# Patient Record
Sex: Male | Born: 1955
Health system: Southern US, Community
[De-identification: ages and names within clinical notes are randomized; demographics above are authoritative.]

## PROBLEM LIST (undated history)

## (undated) ENCOUNTER — Emergency Department (HOSPITAL_COMMUNITY): Admission: EM | Payer: Self-pay | Source: Home / Self Care

## (undated) DIAGNOSIS — F419 Anxiety disorder, unspecified: Secondary | ICD-10-CM

## (undated) DIAGNOSIS — Z9289 Personal history of other medical treatment: Secondary | ICD-10-CM

## (undated) DIAGNOSIS — J449 Chronic obstructive pulmonary disease, unspecified: Secondary | ICD-10-CM

## (undated) DIAGNOSIS — I10 Essential (primary) hypertension: Secondary | ICD-10-CM

## (undated) DIAGNOSIS — J189 Pneumonia, unspecified organism: Secondary | ICD-10-CM

## (undated) DIAGNOSIS — Z9981 Dependence on supplemental oxygen: Secondary | ICD-10-CM

## (undated) HISTORY — PX: BACK SURGERY: SHX140

---

## 2001-06-18 ENCOUNTER — Emergency Department (HOSPITAL_COMMUNITY): Admission: EM | Admit: 2001-06-18 | Discharge: 2001-06-18 | Payer: Self-pay | Admitting: Internal Medicine

## 2002-01-23 ENCOUNTER — Encounter: Payer: Self-pay | Admitting: Emergency Medicine

## 2002-01-23 ENCOUNTER — Observation Stay (HOSPITAL_COMMUNITY): Admission: EM | Admit: 2002-01-23 | Discharge: 2002-01-24 | Payer: Self-pay | Admitting: Emergency Medicine

## 2003-06-06 ENCOUNTER — Emergency Department (HOSPITAL_COMMUNITY): Admission: EM | Admit: 2003-06-06 | Discharge: 2003-06-07 | Payer: Self-pay | Admitting: *Deleted

## 2003-06-06 ENCOUNTER — Encounter: Payer: Self-pay | Admitting: *Deleted

## 2003-08-15 ENCOUNTER — Emergency Department (HOSPITAL_COMMUNITY): Admission: EM | Admit: 2003-08-15 | Discharge: 2003-08-15 | Payer: Self-pay | Admitting: Emergency Medicine

## 2003-12-11 ENCOUNTER — Emergency Department (HOSPITAL_COMMUNITY): Admission: EM | Admit: 2003-12-11 | Discharge: 2003-12-11 | Payer: Self-pay | Admitting: Emergency Medicine

## 2003-12-13 ENCOUNTER — Ambulatory Visit (HOSPITAL_COMMUNITY): Admission: RE | Admit: 2003-12-13 | Discharge: 2003-12-13 | Payer: Self-pay | Admitting: Family Medicine

## 2003-12-13 ENCOUNTER — Inpatient Hospital Stay (HOSPITAL_COMMUNITY): Admission: AD | Admit: 2003-12-13 | Discharge: 2003-12-20 | Payer: Self-pay | Admitting: Family Medicine

## 2004-01-01 ENCOUNTER — Ambulatory Visit (HOSPITAL_COMMUNITY): Admission: RE | Admit: 2004-01-01 | Discharge: 2004-01-01 | Payer: Self-pay | Admitting: Family Medicine

## 2004-01-10 ENCOUNTER — Ambulatory Visit (HOSPITAL_COMMUNITY): Admission: RE | Admit: 2004-01-10 | Discharge: 2004-01-10 | Payer: Self-pay | Admitting: Family Medicine

## 2004-01-14 ENCOUNTER — Ambulatory Visit (HOSPITAL_COMMUNITY): Admission: RE | Admit: 2004-01-14 | Discharge: 2004-01-14 | Payer: Self-pay | Admitting: Family Medicine

## 2004-01-23 ENCOUNTER — Ambulatory Visit (HOSPITAL_COMMUNITY): Admission: RE | Admit: 2004-01-23 | Discharge: 2004-01-23 | Payer: Self-pay | Admitting: Family Medicine

## 2004-03-27 ENCOUNTER — Ambulatory Visit (HOSPITAL_COMMUNITY): Admission: RE | Admit: 2004-03-27 | Discharge: 2004-03-27 | Payer: Self-pay | Admitting: Family Medicine

## 2004-06-09 ENCOUNTER — Emergency Department (HOSPITAL_COMMUNITY): Admission: EM | Admit: 2004-06-09 | Discharge: 2004-06-10 | Payer: Self-pay | Admitting: Emergency Medicine

## 2004-07-24 ENCOUNTER — Ambulatory Visit (HOSPITAL_COMMUNITY): Admission: RE | Admit: 2004-07-24 | Discharge: 2004-07-24 | Payer: Self-pay | Admitting: Family Medicine

## 2004-07-31 ENCOUNTER — Ambulatory Visit (HOSPITAL_COMMUNITY): Admission: RE | Admit: 2004-07-31 | Discharge: 2004-07-31 | Payer: Self-pay | Admitting: Family Medicine

## 2004-09-16 ENCOUNTER — Ambulatory Visit (HOSPITAL_COMMUNITY): Admission: RE | Admit: 2004-09-16 | Discharge: 2004-09-16 | Payer: Self-pay | Admitting: Family Medicine

## 2004-09-22 ENCOUNTER — Ambulatory Visit (HOSPITAL_COMMUNITY): Admission: RE | Admit: 2004-09-22 | Discharge: 2004-09-22 | Payer: Self-pay | Admitting: Family Medicine

## 2004-11-23 ENCOUNTER — Emergency Department (HOSPITAL_COMMUNITY): Admission: EM | Admit: 2004-11-23 | Discharge: 2004-11-23 | Payer: Self-pay | Admitting: Emergency Medicine

## 2004-11-27 ENCOUNTER — Ambulatory Visit (HOSPITAL_COMMUNITY): Admission: RE | Admit: 2004-11-27 | Discharge: 2004-11-27 | Payer: Self-pay | Admitting: Family Medicine

## 2004-12-11 ENCOUNTER — Ambulatory Visit (HOSPITAL_COMMUNITY): Admission: RE | Admit: 2004-12-11 | Discharge: 2004-12-11 | Payer: Self-pay | Admitting: Family Medicine

## 2005-01-22 ENCOUNTER — Encounter (HOSPITAL_COMMUNITY): Admission: RE | Admit: 2005-01-22 | Discharge: 2005-02-21 | Payer: Self-pay | Admitting: Neurology

## 2005-02-23 ENCOUNTER — Encounter (HOSPITAL_COMMUNITY): Admission: RE | Admit: 2005-02-23 | Discharge: 2005-03-08 | Payer: Self-pay | Admitting: Neurology

## 2005-04-25 ENCOUNTER — Emergency Department (HOSPITAL_COMMUNITY): Admission: EM | Admit: 2005-04-25 | Discharge: 2005-04-25 | Payer: Self-pay | Admitting: Emergency Medicine

## 2005-06-19 ENCOUNTER — Inpatient Hospital Stay (HOSPITAL_COMMUNITY): Admission: EM | Admit: 2005-06-19 | Discharge: 2005-06-21 | Payer: Self-pay | Admitting: *Deleted

## 2005-08-24 ENCOUNTER — Emergency Department (HOSPITAL_COMMUNITY): Admission: EM | Admit: 2005-08-24 | Discharge: 2005-08-24 | Payer: Self-pay | Admitting: Emergency Medicine

## 2005-09-04 ENCOUNTER — Emergency Department (HOSPITAL_COMMUNITY): Admission: EM | Admit: 2005-09-04 | Discharge: 2005-09-04 | Payer: Self-pay | Admitting: Emergency Medicine

## 2006-06-16 ENCOUNTER — Emergency Department (HOSPITAL_COMMUNITY): Admission: EM | Admit: 2006-06-16 | Discharge: 2006-06-16 | Payer: Self-pay | Admitting: Emergency Medicine

## 2006-11-18 ENCOUNTER — Inpatient Hospital Stay (HOSPITAL_COMMUNITY): Admission: EM | Admit: 2006-11-18 | Discharge: 2006-11-21 | Payer: Self-pay | Admitting: Emergency Medicine

## 2007-12-29 ENCOUNTER — Ambulatory Visit (HOSPITAL_COMMUNITY): Admission: RE | Admit: 2007-12-29 | Discharge: 2007-12-29 | Payer: Self-pay | Admitting: Family Medicine

## 2008-01-26 ENCOUNTER — Ambulatory Visit (HOSPITAL_COMMUNITY): Admission: RE | Admit: 2008-01-26 | Discharge: 2008-01-26 | Payer: Self-pay | Admitting: Family Medicine

## 2008-02-08 ENCOUNTER — Inpatient Hospital Stay (HOSPITAL_COMMUNITY): Admission: AD | Admit: 2008-02-08 | Discharge: 2008-02-12 | Payer: Self-pay | Admitting: Family Medicine

## 2008-02-19 ENCOUNTER — Ambulatory Visit (HOSPITAL_COMMUNITY): Admission: RE | Admit: 2008-02-19 | Discharge: 2008-02-19 | Payer: Self-pay | Admitting: Family Medicine

## 2008-10-28 ENCOUNTER — Inpatient Hospital Stay (HOSPITAL_COMMUNITY): Admission: EM | Admit: 2008-10-28 | Discharge: 2008-11-01 | Payer: Self-pay | Admitting: Emergency Medicine

## 2008-11-07 ENCOUNTER — Ambulatory Visit (HOSPITAL_COMMUNITY): Admission: RE | Admit: 2008-11-07 | Discharge: 2008-11-07 | Payer: Self-pay | Admitting: Family Medicine

## 2008-11-30 ENCOUNTER — Inpatient Hospital Stay (HOSPITAL_COMMUNITY): Admission: EM | Admit: 2008-11-30 | Discharge: 2008-12-11 | Payer: Self-pay | Admitting: Emergency Medicine

## 2008-12-17 ENCOUNTER — Ambulatory Visit (HOSPITAL_COMMUNITY): Admission: RE | Admit: 2008-12-17 | Discharge: 2008-12-17 | Payer: Self-pay | Admitting: Family Medicine

## 2009-10-25 ENCOUNTER — Inpatient Hospital Stay (HOSPITAL_COMMUNITY): Admission: EM | Admit: 2009-10-25 | Discharge: 2009-10-29 | Payer: Self-pay | Admitting: Emergency Medicine

## 2010-05-26 ENCOUNTER — Inpatient Hospital Stay (HOSPITAL_COMMUNITY): Admission: EM | Admit: 2010-05-26 | Discharge: 2010-05-29 | Payer: Self-pay | Admitting: Emergency Medicine

## 2010-08-03 ENCOUNTER — Ambulatory Visit: Admission: RE | Admit: 2010-08-03 | Discharge: 2010-08-03 | Payer: Self-pay | Admitting: Family Medicine

## 2010-11-14 ENCOUNTER — Encounter (HOSPITAL_COMMUNITY): Payer: Self-pay | Admitting: Radiology

## 2010-11-14 ENCOUNTER — Emergency Department (HOSPITAL_COMMUNITY): Payer: Medicare HMO

## 2010-11-14 ENCOUNTER — Inpatient Hospital Stay (HOSPITAL_COMMUNITY)
Admission: EM | Admit: 2010-11-14 | Discharge: 2010-11-21 | DRG: 189 | Disposition: A | Discharge status: Home Health | Payer: Medicare HMO | Attending: Family Medicine | Admitting: Family Medicine

## 2010-11-14 DIAGNOSIS — J0100 Acute maxillary sinusitis, unspecified: Secondary | ICD-10-CM | POA: Diagnosis present

## 2010-11-14 DIAGNOSIS — J9620 Acute and chronic respiratory failure, unspecified whether with hypoxia or hypercapnia: Principal | ICD-10-CM | POA: Diagnosis present

## 2010-11-14 DIAGNOSIS — J441 Chronic obstructive pulmonary disease with (acute) exacerbation: Secondary | ICD-10-CM | POA: Diagnosis present

## 2010-11-14 DIAGNOSIS — J309 Allergic rhinitis, unspecified: Secondary | ICD-10-CM | POA: Diagnosis present

## 2010-11-14 DIAGNOSIS — E7800 Pure hypercholesterolemia, unspecified: Secondary | ICD-10-CM | POA: Diagnosis present

## 2010-11-14 DIAGNOSIS — E119 Type 2 diabetes mellitus without complications: Secondary | ICD-10-CM | POA: Diagnosis present

## 2010-11-14 DIAGNOSIS — F17200 Nicotine dependence, unspecified, uncomplicated: Secondary | ICD-10-CM | POA: Diagnosis present

## 2010-11-14 LAB — CBC
HCT: 39.7 % (ref 39.0–52.0)
Hemoglobin: 13.2 g/dL (ref 13.0–17.0)
MCV: 89.8 fL (ref 78.0–100.0)
WBC: 8.6 10*3/uL (ref 4.0–10.5)

## 2010-11-14 LAB — COMPREHENSIVE METABOLIC PANEL
AST: 15 U/L (ref 0–37)
Albumin: 3.5 g/dL (ref 3.5–5.2)
BUN: 4 mg/dL — ABNORMAL LOW (ref 6–23)
Calcium: 9.2 mg/dL (ref 8.4–10.5)
Chloride: 91 mEq/L — ABNORMAL LOW (ref 96–112)
Creatinine, Ser: 0.84 mg/dL (ref 0.4–1.5)
GFR calc Af Amer: >60 mL/min (ref >60)
GFR calc non Af Amer: >60 mL/min (ref >60)
Total Bilirubin: 0.5 mg/dL (ref 0.3–1.2)

## 2010-11-14 LAB — POCT CARDIAC MARKERS
CKMB, poc: 7.4 ng/mL (ref 1.0–8.0)
Myoglobin, poc: 128 ng/mL (ref 12–200)
Troponin i, poc: <0.05 ng/mL (ref 0.00–0.09)

## 2010-11-14 LAB — DIFFERENTIAL
Basophils Absolute: 0 10*3/uL (ref 0.0–0.1)
Eosinophils Relative: 1 % (ref 0–5)
Lymphocytes Relative: 24 % (ref 12–46)
Lymphs Abs: 2.1 10*3/uL (ref 0.7–4.0)
Monocytes Absolute: 0.7 10*3/uL (ref 0.1–1.0)
Neutro Abs: 5.7 10*3/uL (ref 1.7–7.7)

## 2010-11-15 ENCOUNTER — Inpatient Hospital Stay (HOSPITAL_COMMUNITY): Payer: Medicare HMO

## 2010-11-15 LAB — CBC
HCT: 38 % — ABNORMAL LOW (ref 39.0–52.0)
Hemoglobin: 12.4 g/dL — ABNORMAL LOW (ref 13.0–17.0)
RBC: 4.2 MIL/uL — ABNORMAL LOW (ref 4.22–5.81)
RDW: 13.4 % (ref 11.5–15.5)
WBC: 8.5 10*3/uL (ref 4.0–10.5)

## 2010-11-15 LAB — GLUCOSE, CAPILLARY
Glucose-Capillary: 147 mg/dL — ABNORMAL HIGH (ref 70–99)
Glucose-Capillary: 159 mg/dL — ABNORMAL HIGH (ref 70–99)
Glucose-Capillary: 151 mg/dL — ABNORMAL HIGH (ref 70–99)

## 2010-11-15 LAB — BLOOD GAS, ARTERIAL
Acid-Base Excess: 9.2 mmol/L — ABNORMAL HIGH (ref 0.0–2.0)
Bicarbonate: 34.9 mEq/L — ABNORMAL HIGH (ref 20.0–24.0)
Inspiratory PAP: 14
O2 Content: 3 L/min
O2 Saturation: 91.7 %
TCO2: 32.5 mmol/L (ref 0–100)
pCO2 arterial: 76.4 mmHg (ref 35.0–45.0)
pO2, Arterial: 78.2 mmHg — ABNORMAL LOW (ref 80.0–100.0)
pO2, Arterial: 60.7 mmHg — ABNORMAL LOW (ref 80.0–100.0)

## 2010-11-15 LAB — BASIC METABOLIC PANEL
Calcium: 9.5 mg/dL (ref 8.4–10.5)
GFR calc Af Amer: >60 mL/min (ref >60)
GFR calc non Af Amer: >60 mL/min (ref >60)
Potassium: 4.4 mEq/L (ref 3.5–5.1)
Sodium: 137 mEq/L (ref 135–145)

## 2010-11-15 LAB — DIFFERENTIAL
Basophils Absolute: 0 10*3/uL (ref 0.0–0.1)
Eosinophils Relative: 0 % (ref 0–5)
Lymphocytes Relative: 5 % — ABNORMAL LOW (ref 12–46)
Neutro Abs: 8.1 10*3/uL — ABNORMAL HIGH (ref 1.7–7.7)
Neutrophils Relative %: 95 % — ABNORMAL HIGH (ref 43–77)

## 2010-11-16 LAB — BLOOD GAS, ARTERIAL
Acid-Base Excess: 10.8 mmol/L — ABNORMAL HIGH (ref 0.0–2.0)
Bicarbonate: 36.9 mEq/L — ABNORMAL HIGH (ref 20.0–24.0)
Bicarbonate: 36.2 mEq/L — ABNORMAL HIGH (ref 20.0–24.0)
Delivery systems: POSITIVE
FIO2: 0.35 %
O2 Saturation: 97.7 %
Patient temperature: 37
Patient temperature: 37
TCO2: 33.2 mmol/L (ref 0–100)
TCO2: 33.4 mmol/L (ref 0–100)
pCO2 arterial: 71.8 mmHg (ref 35.0–45.0)
pH, Arterial: 7.336 — ABNORMAL LOW (ref 7.350–7.450)
pH, Arterial: 7.323 — ABNORMAL LOW (ref 7.350–7.450)
pO2, Arterial: 108 mmHg — ABNORMAL HIGH (ref 80.0–100.0)

## 2010-11-16 LAB — GLUCOSE, CAPILLARY: Glucose-Capillary: 156 mg/dL — ABNORMAL HIGH (ref 70–99)

## 2010-11-17 LAB — BLOOD GAS, ARTERIAL
Acid-Base Excess: 11.1 mmol/L — ABNORMAL HIGH (ref 0.0–2.0)
TCO2: 33.6 mmol/L (ref 0–100)
pCO2 arterial: 65.6 mmHg (ref 35.0–45.0)
pH, Arterial: 7.367 (ref 7.350–7.450)
pO2, Arterial: 92.3 mmHg (ref 80.0–100.0)

## 2010-11-17 LAB — GLUCOSE, CAPILLARY
Glucose-Capillary: 159 mg/dL — ABNORMAL HIGH (ref 70–99)
Glucose-Capillary: 163 mg/dL — ABNORMAL HIGH (ref 70–99)

## 2010-11-18 LAB — BLOOD GAS, ARTERIAL
Patient temperature: 37
pCO2 arterial: 65.2 mmHg (ref 35.0–45.0)
pH, Arterial: 7.361 (ref 7.350–7.450)

## 2010-11-18 LAB — GLUCOSE, CAPILLARY
Glucose-Capillary: 164 mg/dL — ABNORMAL HIGH (ref 70–99)
Glucose-Capillary: 234 mg/dL — ABNORMAL HIGH (ref 70–99)

## 2010-11-19 LAB — GLUCOSE, CAPILLARY
Glucose-Capillary: 315 mg/dL — ABNORMAL HIGH (ref 70–99)
Glucose-Capillary: 257 mg/dL — ABNORMAL HIGH (ref 70–99)
Glucose-Capillary: 335 mg/dL — ABNORMAL HIGH (ref 70–99)

## 2010-11-20 LAB — GLUCOSE, CAPILLARY
Glucose-Capillary: 252 mg/dL — ABNORMAL HIGH (ref 70–99)
Glucose-Capillary: 353 mg/dL — ABNORMAL HIGH (ref 70–99)
Glucose-Capillary: 298 mg/dL — ABNORMAL HIGH (ref 70–99)

## 2010-11-21 LAB — GLUCOSE, CAPILLARY
Glucose-Capillary: 162 mg/dL — ABNORMAL HIGH (ref 70–99)
Glucose-Capillary: 263 mg/dL — ABNORMAL HIGH (ref 70–99)

## 2010-11-21 NOTE — H&P (Signed)
NAME:  Jeff Ray, Jeff Ray                 ACCOUNT NO.:  [id number]  MEDICAL RECORD NO.:  [id number]           PATIENT TYPE:  I  LOCATION:  A306                          FACILITY:  [facility]  PHYSICIAN:  Mila Homer. Sudie Bailey, M.D.DATE OF BIRTH:  Mar 12, 1956  DATE OF ADMISSION:  11/14/2010 DATE OF DISCHARGE:  LH                             HISTORY & PHYSICAL   HISTORY OF PRESENT ILLNESS:  This 55 year old patient of Dr. Butch Penny developed symptoms of a maxillary sinusitis starting about 6 or 7 days prior to admission.  Then several days after that, he began having cough productive of white sputum and dyspnea.  The situation became bad enough that he presented to the [facility] emergency department November 14, 2010.  He has had numerous admissions for COPD.  Reviewing the electronic record, I note one in March 2010, one in January 2011, and one in August 2011.  He also has diagnoses of diabetes and hypercholesterolemia.  SOCIAL HISTORY:  He currently lives with his wife.  He has 3 sons and 1 grandchild.  He worked at [employer], functioned as a Scientist, forensic for 2 years. Then he worked for [employer] off and on for 16 years doing the same thing.  He worked in various places including the [employer] at [employer].  He has smoked cigarettes since age 75, currently, averaging about a pack a day but does not use alcohol or illicit drugs.  He also has diagnoses of diabetes and hypercholesterolemia.  CURRENT MEDICATIONS: 1. Symbicort daily. 2. Singulair 10 mg daily. 3. Simvastatin 40 mg daily. 4. Metformin 2 tablets daily. 5. Albuterol nebulizer as needed. 6. Hydromet. The dosage strengths and dosing intervals were not on the ER record and we are asked family to bring these.  REVIEW OF SYSTEMS:  He stated he has pain over his face.  He said he is blowing out thick green material, as well as coughing up whitish sputum. He has not had fever or  chills but he has had chest discomfort, but feels it is probably anxiety, and has felt that off and on over a month in the left chest.  It is not brought on by exercise.  He has had no problems his GI or GU systems.  PHYSICAL EXAMINATION:  GENERAL APPEARANCE:  Exam shows a pleasant middle- aged man who is currently getting a breathing treatment and is quite dyspneic. VITAL SIGNS:  At the time of admission, his temperature is 99 degrees, blood pressure 157/82, pulse 113, respiratory rate 24. HEAD AND NECK:  :  He is a good historian.  His speech is normal but again somewhat difficult to hear because he is working for breath with a mask on and albuterol treatment.  He has tenderness over his maxilla but not over the frontal sinuses.  There are negative anterior cervical nodes. LUNGS:  His lungs show decreased breath sounds throughout with prolonged expirations.  Intercostal retractions are noted. HEART:  Heart has a regular rhythm, rate of about 110 but heart sounds were faint. ABDOMEN:  The abdomen is soft without organomegaly or mass.  EXTREMITIES:  There is trace edema of the ankles.  LABORATORY DATA/DIAGNOSTIC STUDIES:  His chest x-ray showed COPD with chronic lung markings.  Today's CBC shows a white cell count of 8600 of which 67% were neutrophils, 24 lymphs.  Hemoglobin was 13.2, platelet count 196,000. CMP shows a sodium of 132, chloride 91, bicarb 35, glucose 160, BUN 4 with a creatinine of 0.84.  Cardiac markers were normal, D-dimer 0.42.  ASSESSMENT/ADMISSION DIAGNOSES: 1. Chronic obstructive pulmonary disease exacerbation. 2. Type 2 diabetes. 3. Tobacco use disorder. 4. Hypercholesterolemia. 5. Allergic rhinitis. 6. Acute maxillary sinusitis.  He is admitted to hospital.  He is getting breathing treatments and now BiPAP as he is still quite dyspneic.  He will be on albuterol, Atrovent neb treatments q.4 hours while awake and q.2 h p.r.n. with Solu-Medrol 125 mg IV  q.6, O2 to make his O2 sat greater than 90%.  Will continue him on alprazolam at 0.5 mg q.i.d. p.r.n. anxiety but I am holding his other meds.  Family to bring [place] exact dosages, dosing intervals.  He will be on a 2000 calorie no sweets added diet, bathroom privileges with assistance.  Will start him on Rocephin and Zithromax IV.     Mila Homer. Sudie Bailey, M.D.     SDK/MEDQ  D:  11/14/2010  T:  11/14/2010  Job:  [id number]  Electronically Signed by John Giovanni M.D. on 11/21/2010 05:23:46 AM

## 2010-11-21 NOTE — Progress Notes (Signed)
  NAME:  Jeff Ray, Jeff Ray                 ACCOUNT NO.:  [id number]  MEDICAL RECORD NO.:  [id number]           PATIENT TYPE:  I  LOCATION:  A306                          FACILITY:  [facility]  PHYSICIAN:  Mila Homer. Sudie Bailey, M.D.DATE OF BIRTH:  1956-06-15  DATE OF PROCEDURE: DATE OF DISCHARGE:                                PROGRESS NOTE   SUBJECTIVE:  I admitted him last night with COPD exacerbation.  He was quite dyspneic at the time.  During the evening, he stayed dyspneic and respiratory rate was elevated, so we put on BiPAP and he did better with this.  I asked Dr. Juanetta Gosling, pulmonologist, to see him in consult. Today, really is not saying much, but finally seems to be asleep after having a rough night.  OBJECTIVE:  VITAL SIGNS: Temperature 97.5, pulse 105, respiratory rate 38, blood pressure 133/77.  On my exam his respiratory rate is 30.  He is on BiPAP. LUNGS: Show decreased breath sounds throughout, but he is moving air well. CARDIOVASCULAR: Heart sounds are very faint, but pulse is running about 80 now. ABDOMEN: Soft, without organomegaly or mass. EXTREMITIES: There is no edema of the ankles.  LABORATORY DATA:  O2 sat, most recent, was 90% on 35% O2 mask with BiPAP and earlier was 93% on 35% mask with BiPAP.  Blood tests today showed a white cell count 8500, of which 95% were neutrophils.  His BMP showed a chloride of 95, bicarb 34, glucose 194.  Blood gases at about 3:00 a.m. this morning showed a pH of 7.28, with pCO2 of 76, pO2 of 78 on 3 liters by nasal cannula and then this morning on 14/4 BiPAP he had a pH of 7.31, pCO2 of 72, pO2 of 60.  ASSESSMENT: 1. Chronic obstructive pulmonary disease exacerbation with     hypercapnia. 2. Type 2 diabetes. 3. Tobacco use disorder. 4. Hypercholesterolemia. 5. Allergic rhinitis. 6. Acute maxillary sinusitis.  PLAN: 1. Continue the Rocephin and Zithromax IV. 2. Continue O2 with BiPAP.  I have asked Dr. Juanetta Gosling, pulmonologist,  to see him in consult as noted. 3. Continue IV steroids.     Mila Homer. Sudie Bailey, M.D.     SDK/MEDQ  D:  11/15/2010  T:  11/15/2010  Job:  [id number]  Electronically Signed by John Giovanni M.D. on 11/21/2010 05:24:01 AM

## 2010-11-24 NOTE — Progress Notes (Signed)
  NAME:  Jeff Ray, Jeff Ray                 ACCOUNT NO.:  [id number]  MEDICAL RECORD NO.:  [id number]            PATIENT TYPE:  LOCATION:                                 FACILITY:  PHYSICIAN:  Eugen Jeansonne G. Renard Matter, MD   DATE OF BIRTH:  1956-08-17  DATE OF PROCEDURE: DATE OF DISCHARGE:                                PROGRESS NOTE   This patient is off the BiPAP this morning.  Seems to be feeling fairly well with the exception of occasional cough and shortness of breath.  He does have end-stage COPD.  OBJECTIVE:  VITAL SIGNS:  Blood pressure 157/92, respirations 20, pulse 87, temperature 97.8. LUNGS:  Rhonchi bilaterally with diminished breath sounds. HEART:  Regular rhythm. ABDOMEN:  No palpable organs or masses.  ASSESSMENT:  The patient does have end-stage chronic obstructive pulmonary disease.  PLAN:  To continue current regimen.  We will attempt to get patient discharged over the weekend.     Jadyn Brasher G. Renard Matter, MD     AGM/MEDQ  D:  11/19/2010  T:  11/19/2010  Job:  [id number]  Electronically Signed by Butch Penny MD on 11/24/2010 06:32:21 AM

## 2010-11-24 NOTE — Discharge Summary (Signed)
NAME:  Jeff Ray, Jeff Ray                 ACCOUNT NO.:  [id number]  MEDICAL RECORD NO.:  [id number]           PATIENT TYPE:  I  LOCATION:  A306                          FACILITY:  [facility]  PHYSICIAN:  Daniell Paradise G. Renard Matter, MD   DATE OF BIRTH:  19-Dec-1955  DATE OF ADMISSION:  11/14/2010 DATE OF DISCHARGE:  02/18/2012LH                              DISCHARGE SUMMARY   DIAGNOSES: 1. End-stage chronic obstructive pulmonary disease. 2. Type 2 diabetes. 3. Hyperlipidemia. 4. Acute maxillary sinusitis.  CONDITION:  Stable and improved at time of his discharge.  This 55 year old patient developed symptoms of maxillary sinusitis 6-7 days prior to admission and several days after that, he began having cough productive of white sputum and dyspnea.  He presented to the emergency department and subsequently was admitted.  EXAMINATION:  GENERAL:  The patient was dyspneic at the time of his admission. VITAL SIGNS:  Blood pressure 157/82, pulse 113, respiration 24, temperature 99. HEENT:  Negative. LUNGS:  Diminished breath sound, prolonged expiration intercostal retractions noted. HEART:  Regular rhythm with a rate about 110. ABDOMEN:  No palpable organs or masses.  No organomegaly. EXTREMITIES:  Free of edema.  LABORATORY DATA:  CBC on admission:  WBC 8600 with hemoglobin 13.2, hematocrit 39.7.  CMP on admission:  Sodium 132, potassium 3.8, chloride of 91, CO2 35, glucose 116, BUN 4, creatinine 0.84.  D-dimer 0.42. Blood gases on admission, pH 7.282 with pCO2 of 76.4, pO2 78.2. Subsequent blood gas on the 15th showed a pH of 7.361 with a pCO2 of 65.2, pO2 68.4.  D-dimer on admission 0.42.  X-rays:  Chest x-ray on admission showed evidence of COPD with chronic lung findings.  No infiltrate or congestive heart failure or pneumothorax.  Subsequent chest x-ray showed similar findings, mild bibasilar atelectasis, chronic peribronchial thickening.  HOSPITAL COURSE:  The patient at the time of his  admission was placed on diabetic diet, was placed on IV Solu-Medrol 125 mg every 6 hours, albuterol and Atrovent neb treatments q.i.d.  He was placed on Zithromax 500 mg daily and Rocephin 1 gram every 24 hours.  He is continued on guaifenesin 1200 mg b.i.d., insulin according to sliding scale, Singulair 10 mg daily, nicotine patch daily.  Placed on prednisone 40 mg daily following the IV Solu-Medrol, simvastatin 40 mg daily.  Also received p.r.n. medications, alprazolam 0.5 mg every 4 hours, Hycodan every 4 hours p.r.n.  The patient slowly improved throughout his hospital stay.  He had marked dyspnea with hypoxia and hypercapnia, was seen in consultation by Pulmonology, Dr. Juanetta Gosling.  His blood gases were monitored and he slowly was able to be transitioned from BiPAP to nasal cannula, remained in the hospital total of 7 days and was able be discharged on the seventh day to home care.  He was discharged on the following medications: 1. Guaifenesin 1200 mg b.i.d. 2. Alprazolam 0.5 mg every 4 hours p.r.n. 3. Albuterol inhaler every 6 hours. 4. Hydromet a teaspoon every 4 hours p.r.n. 5. Ibuprofen 200 mg 2 tablets every 6 hours as needed. 6. Metformin 500 mg 2 tablets b.i.d. 7. Symbicort 2 puffs  b.i.d. 8. Simvastatin 40 mg daily. 9. Singulair 10 mg daily.  The patient was urged to use a BiPAP at home, which he already has been set up at home for use.  The patient was more stable at time of his discharge.     Jeff Ray G. Renard Matter, MD     AGM/MEDQ  D:  11/21/2010  T:  11/21/2010  Job:  [id number]  Electronically Signed by Butch Penny MD on 11/24/2010 06:32:12 AM

## 2010-11-24 NOTE — Progress Notes (Signed)
  NAME:  Jeff Ray, Jeff Ray                 ACCOUNT NO.:  [id number]  MEDICAL RECORD NO.:  [id number]           PATIENT TYPE:  LOCATION:                                 FACILITY:  PHYSICIAN:  Lilla Callejo G. Renard Matter, MD   DATE OF BIRTH:  1955-12-20  DATE OF PROCEDURE:  11/18/2010 DATE OF DISCHARGE:                                PROGRESS NOTE   SUBJECTIVE:  This patient apparently was off the BiPAP most of the day yesterday with nasal cannula.  He had to be placed back on last evening, he does have COPD.  OBJECTIVE:  VITAL SIGNS:  Blood pressure 157/91, temperature 97.8, pulse 82, and respirations 24. LUNGS:  Diminished breath sounds bilaterally. HEART:  Regular rhythm. ABDOMEN:  No palpable organs or masses.  ASSESSMENT:  The patient does have severe chronic obstructive pulmonary disease and maxillary sinusitis.  PLAN:  To continue current regimen.  We will continue to attempt to get the patient off of BiPAP and on nasal cannula.  Blood sugars run from 159 to 229 on current regimen.  His blood gases are improving.     Windi Toro G. Renard Matter, MD     AGM/MEDQ  D:  11/18/2010  T:  11/18/2010  Job:  [id number]  Electronically Signed by Butch Penny MD on 11/24/2010 06:32:18 AM

## 2010-11-24 NOTE — Progress Notes (Signed)
  NAME:  Jeff Ray, Jeff Ray                 ACCOUNT NO.:  [id number]  MEDICAL RECORD NO.:  [id number]           PATIENT TYPE:  I  LOCATION:  A306                          FACILITY:  [facility]  PHYSICIAN:  Chassity Ludke G. Renard Matter, MD   DATE OF BIRTH:  10/13/1955  DATE OF PROCEDURE: DATE OF DISCHARGE:                                PROGRESS NOTE   This patient appears to be comfortable.  Attempts were made yesterday to get him on nasal cannula but he had been placed back on BiPAP.  He does have COPD.  His most recent blood gases show pH of 7.367 with a pCO2 of 65.6 and a pO2 92.3.  OBJECTIVE:  VITAL SIGNS:  Blood pressure 136/85, respirations 20, pulse 85, temperature 97.5. LUNGS:  Diminished breath sounds bilaterally. HEART:  Regular rhythm. ABDOMEN:  No palpable organs or masses.  ASSESSMENT:  The patient was admitted, continued to have chronic obstructive pulmonary disease and maxillary sinusitis.  PLAN:  To continue current regimen.  We will attempt to get the patient off of BiPAP and back on nasal cannula in the near future.     Hayden Mabin G. Renard Matter, MD     AGM/MEDQ  D:  11/17/2010  T:  11/17/2010  Job:  [id number]  Electronically Signed by Butch Penny MD on 11/24/2010 06:32:15 AM

## 2010-11-24 NOTE — Progress Notes (Signed)
  NAME:  Jeff Ray, Jeff Ray                 ACCOUNT NO.:  [id number]  MEDICAL RECORD NO.:  [id number]           PATIENT TYPE:  I  LOCATION:  A306                          FACILITY:  [facility]  PHYSICIAN:  Jeff Sneed G. Renard Matter, MD   DATE OF BIRTH:  [birth year]  DATE OF PROCEDURE:  11/20/2010 DATE OF DISCHARGE:                                PROGRESS NOTE   This patient had a fairly good day yesterday, was on nasal cannula for most of the day, but started back on BiPAP last evening.  He states that he feels better.  He does have end-stage COPD.  OBJECTIVE:  VITAL SIGNS:  Blood pressure 144/82, respirations 24, pulse 73, temperature 97.4.  Most recent blood gases pH 7.361 with pCO2 of 65.2, pO2 of 68.4. LUNGS:  Diminished breath sounds. HEART:  Regular rhythm. ABDOMEN:  No palpable organs or masses.  ASSESSMENT:  The patient does have end-stage chronic obstructive pulmonary disease, but has improved some.  Plan to continue current regimen.  We will attempt to get the patient discharged over the weekend.     Jeff Ray G. Renard Matter, MD     AGM/MEDQ  D:  11/20/2010  T:  11/20/2010  Job:  [id number]  Electronically Signed by Butch Penny MD on 11/24/2010 06:32:24 AM

## 2010-11-30 NOTE — Progress Notes (Signed)
  NAME:  Jeff Ray, Jeff Ray                 ACCOUNT NO.:  [id number]  MEDICAL RECORD NO.:  [id number]           PATIENT TYPE:  LOCATION:                                 FACILITY:  PHYSICIAN:  Mcdonald Reiling L. Juanetta Gosling, M.D.DATE OF BIRTH:  Sep 23, 1956  DATE OF PROCEDURE: DATE OF DISCHARGE:                                PROGRESS NOTE   Jeff Ray is a patient of Dr. Renard Matter with severe COPD.  He has been on BiPAP.  We attempted to switch him from BiPAP to nasal cannula yesterday and that was not successful.  He is, otherwise, about the same.  His exam shows that his chest is relatively clear with decreased breath sounds.  His heart is regular.  His blood gas looks better.  His pCO2 is down in the 60s now, pH better at about 7.34, so I think we should try again to see if he can come off the BiPAP.  My assessment is that he has COPD with acute-on-chronic respiratory failure, and my plan is to continue with his meds and treatments, try him off the BiPAP and see how he does.     Jariana Shumard L. Juanetta Gosling, M.D.     ELH/MEDQ  D:  11/17/2010  T:  11/17/2010  Job:  [id number]  Electronically Signed by Kari Baars M.D. on 11/30/2010 02:01:14 PM

## 2010-11-30 NOTE — Progress Notes (Signed)
  NAME:  Jeff Ray, Jeff Ray                 ACCOUNT NO.:  [id number]  MEDICAL RECORD NO.:  [id number]           PATIENT TYPE:  LOCATION:                                 FACILITY:  PHYSICIAN:  Bodie Abernethy L. Juanetta Gosling, M.D.DATE OF BIRTH:  [birth year]  DATE OF PROCEDURE: DATE OF DISCHARGE:                                PROGRESS NOTE   Jeff Ray is a patient of Dr. Renard Matter who is admitted with COPD exacerbation and acute respiratory failure.  He is much improved and says he is hopeful of going home tomorrow.  He is still wearing BiPAP at night, but he has it at home as well.  PHYSICAL EXAMINATION:  VITAL SIGNS:  Temperature is 97.4, pulse 73, respirations 24, and blood pressure 144/82.  His O2 sat 93% on 35% BiPAP. CHEST:  Much clearer. GENERAL:  He looks more comfortable.  ASSESSMENT:  I think he is improving and probably close to maximum hospital benefit and I agree with plans to try to get him home hopefully in the morning.  I would not change any other treatments at this point. He will remain on BiPAP at home.  He wants to make an appointment to come see me in my office as well as seeing Dr. Renard Matter and I think that is appropriate given the degree of his COPD.     Sopheap Boehle L. Juanetta Gosling, M.D.     ELH/MEDQ  D:  11/20/2010  T:  11/20/2010  Job:  [id number]  Electronically Signed by Kari Baars M.D. on 11/30/2010 02:01:25 PM

## 2010-11-30 NOTE — Progress Notes (Signed)
  NAME:  EILEEN, CROSWELL                 ACCOUNT NO.:  [id number]  MEDICAL RECORD NO.:  [id number]           PATIENT TYPE:  LOCATION:                                 FACILITY:  PHYSICIAN:  Mirtha Jain L. Juanetta Gosling, M.D.DATE OF BIRTH:  [birth year]  DATE OF PROCEDURE: DATE OF DISCHARGE:                                PROGRESS NOTE   Mr. Bublitz is improved and says he hopes he is going to be able to go home today.  He has no new complaints.  GENERAL:  His exam shows that he is awake and alert.  He looks comfortable. VITAL SIGNS:  His temperature is 97.6, pulse 65, respirations 16, blood pressure 130/84, O2 sat 95% on 4 liters. CHEST:  Clear with decreased breath sounds.  He looks much better.  ASSESSMENT:  He is improved.  PLAN:  I have discussed the situation with Dr. Renard Matter.  McInnis is contemplating discharge.     Satori Krabill L. Juanetta Gosling, M.D.     ELH/MEDQ  D:  11/21/2010  T:  11/21/2010  Job:  [id number]  Electronically Signed by Kari Baars M.D. on 11/30/2010 02:01:22 PM

## 2010-11-30 NOTE — Consult Note (Signed)
  NAME:  Jeff Ray, Jeff Ray                 ACCOUNT NO.:  [id number]  MEDICAL RECORD NO.:  [id number]           PATIENT TYPE:  I  LOCATION:  A306                          FACILITY:  [facility]  PHYSICIAN:  Simara Rhyner L. Juanetta Gosling, M.D.DATE OF BIRTH:  06-18-1956  DATE OF CONSULTATION: DATE OF DISCHARGE:  11/15/2010                                CONSULTATION   Jeff Ray is a patient of Dr. Renard Matter.  Consultation is requested because of COPD exacerbation and acute-on- chronic respiratory failure.  Jeff Ray is a 55 year old, patient of Dr. Renard Matter, who started having what was described as maxillary sinusitis several days prior to admission, then he became increasingly short of breath, started coughing up a lot of sputum and came to the emergency room.  He has been hospitalized on multiple occasions with COPD.  He has not had any definite fever or chills and I cannot get much history from him now because he is on BiPAP.  According to the medical record, he has COPD, diabetes, and hyperlipidemia.  SOCIAL HISTORY:  He lives at home with family.  He worked in a [employer]. He has about a 50 pack-year smoking history, continues to smoke cigarettes.  He does not use any alcohol or illicit drugs.  FAMILY HISTORY:  Unknown.  REVIEW OF SYSTEMS:  Except mention of COPD in old medical record.  There is also I mentioned that he in the past has had significant and excess use of alcohol.  HOME MEDICATIONS: 1. He has been taking Symbicort 2 inhalations b.i.d. 2. Singulair 10 mg daily. 3. Simvastatin 40 mg daily. 4. Metformin 2 tablets daily. 5. Albuterol by nebulizer as needed.  PHYSICAL EXAMINATION:  GENERAL:  His exam shows that he is on BiPAP.  He is arousable, but sleepy. CHEST:  Relatively clear with decreased breath sounds. HEART:  Regular without gallop. ABDOMEN:  Soft. VITAL SIGNS:  Temperature is 97.5, pulse 105, respirations 38, blood pressure 133/77.  He has some edema in his maxillary sinus  are and he has tender in the maxillary sinus area.  His blood gas on BiPAP this morning shows pH 7.31, pCO2 of 72, pO2 of 60.7.  His chest x-ray does not show anything acute, does show diffuse peribronchial thickening.  ASSESSMENT:  He has what appears to be maxillary sinusitis.  He has severe chronic obstructive pulmonary disease and he is being treated for that.  I have just discussed the situation with his respiratory therapist and adjustments have been made his BiPAP which may improve his pCO2.  I think at this point he is on antibiotics.  He is on steroids. He is on inhaled bronchodilators and I do not think there is a great deal to add and we will plan to continue this regimen and follow.     Jeff Ray L. Juanetta Gosling, M.D.     ELH/MEDQ  D:  11/15/2010  T:  11/16/2010  Job:  [id number]  Electronically Signed by Kari Baars M.D. on 11/30/2010 02:00:48 PM

## 2010-11-30 NOTE — Progress Notes (Signed)
  NAME:  Jeff Ray, Jeff Ray                 ACCOUNT NO.:  [id number]  MEDICAL RECORD NO.:  [id number]           PATIENT TYPE:  LOCATION:                                 FACILITY:  PHYSICIAN:  Deania Siguenza L. Juanetta Gosling, M.D.DATE OF BIRTH:  1956/02/11  DATE OF PROCEDURE: DATE OF DISCHARGE:                                PROGRESS NOTE   Jeff Ray is a patient of Dr. Renard Matter with severe end-stage COPD.  I discussed all this with him this morning and he has BiPAP at home, but he does not use it.  He says he will use it now.  He feels better, seems to be doing better.  He was able to stay on a nasal cannula most of the day yesterday.  His exam shows his blood pressure 137/82, temperature is 97.6, pulse 65, respirations 20, O2 sat 100% on BiPAP at 35%.  His chest is much clearer and overall he looks better.  He is set for blood gas today.  My assessment then is that he has severe chronic obstructive pulmonary disease.  He has required BiPAP at home, but he has not using and that may be part of the problem.  He has had an exacerbation of his chronic obstructive pulmonary disease.  I would plan to put him back on nasal cannula, put him back on the BiPAP at night and that is how he is doing at his home and then hopefully we can arrange for him to be candidate for something like Daliresp to reduce the number of exacerbations of COPD.     Jeff Ray L. Juanetta Gosling, M.D.     ELH/MEDQ  D:  11/18/2010  T:  11/18/2010  Job:  [id number]  Electronically Signed by Kari Baars M.D. on 11/30/2010 02:01:08 PM

## 2010-11-30 NOTE — Progress Notes (Signed)
  NAME:  Jeff Ray                 ACCOUNT NO.:  [id number]  MEDICAL RECORD NO.:  [id number]           PATIENT TYPE:  I  LOCATION:  A306                          FACILITY:  [facility]  PHYSICIAN:  Jeff Ray, M.D.DATE OF BIRTH:  09-Apr-1956  DATE OF PROCEDURE: DATE OF DISCHARGE:                                PROGRESS NOTE   Jeff Ray is a patient of Dr. Renard Ray, admitted with COPD.  He has chronic respiratory failure.  He has been doing better.  He is on BiPAP and he is using his nasal cannula during the day.  He says he feels okay.  He has no new complaints.  PHYSICAL EXAMINATION:  GENERAL:  Shows that he is awake and alert. CHEST:  Relatively clear. HEART:  Regular without gallop. ABDOMEN:  Soft. EXTREMITIES:  No edema.  He has some minimal end-expiratory wheezes.  Assessment then he is better.  PLAN:  Continue with his current treatments and medications.  No changes today.     Jeff Ray, M.D.     ELH/MEDQ  D:  11/19/2010  T:  11/19/2010  Job:  [id number]  Electronically Signed by Kari Baars M.D. on 11/30/2010 02:01:18 PM

## 2010-11-30 NOTE — Progress Notes (Signed)
  NAME:  Jeff Ray, Jeff Ray                 ACCOUNT NO.:  [id number]  MEDICAL RECORD NO.:  [id number]           PATIENT TYPE:  I  LOCATION:  A306                          FACILITY:  [facility]  PHYSICIAN:  Jakita Dutkiewicz L. Juanetta Gosling, M.D.DATE OF BIRTH:  [birth year]  DATE OF PROCEDURE: DATE OF DISCHARGE:                                PROGRESS NOTE   Mr. Stacey looks much better this morning.  He was admitted with COPD exacerbation.  He has been on BiPAP and is still on BiPAP.  His blood gas is not much different, but overall I think he looks better.  He has no other new complaints.  PHYSICAL EXAMINATION:  VITAL SIGNS:  His exam now shows that his temperature is 97.1, pulse 96, respirations 23, blood pressure 120/80, O2 sats 100% on 35% and BiPAP. CHEST:  Much clear.  He is much more alert and overall I think looks better.  His blood gas this morning on BiPAP shows pH 7.33, PCO2 of 70, PO2 of 92.  ASSESSMENT:  He is much improved and I am going to try him off the BiPAP and see how he tolerates that.     Lyvonne Cassell L. Juanetta Gosling, M.D.     ELH/MEDQ  D:  11/16/2010  T:  11/16/2010  Job:  [id number]  Electronically Signed by Kari Baars M.D. on 11/30/2010 02:01:03 PM

## 2010-12-17 LAB — GLUCOSE, CAPILLARY
Glucose-Capillary: 215 mg/dL — ABNORMAL HIGH (ref 70–99)
Glucose-Capillary: 219 mg/dL — ABNORMAL HIGH (ref 70–99)
Glucose-Capillary: 241 mg/dL — ABNORMAL HIGH (ref 70–99)
Glucose-Capillary: 260 mg/dL — ABNORMAL HIGH (ref 70–99)
Glucose-Capillary: 285 mg/dL — ABNORMAL HIGH (ref 70–99)
Glucose-Capillary: 320 mg/dL — ABNORMAL HIGH (ref 70–99)
Glucose-Capillary: 330 mg/dL — ABNORMAL HIGH (ref 70–99)

## 2010-12-17 LAB — CBC
MCH: 30.4 pg (ref 26.0–34.0)
Platelets: 171 10*3/uL (ref 150–400)
RBC: 4.56 MIL/uL (ref 4.22–5.81)

## 2010-12-17 LAB — BASIC METABOLIC PANEL
CO2: 36 mEq/L — ABNORMAL HIGH (ref 19–32)
Calcium: 9.5 mg/dL (ref 8.4–10.5)
Creatinine, Ser: 0.93 mg/dL (ref 0.4–1.5)
GFR calc Af Amer: >60 mL/min (ref >60)

## 2010-12-17 LAB — DIFFERENTIAL
Basophils Relative: 0 % (ref 0–1)
Eosinophils Absolute: 0.1 10*3/uL (ref 0.0–0.7)
Lymphs Abs: 2.1 10*3/uL (ref 0.7–4.0)
Neutrophils Relative %: 65 % (ref 43–77)

## 2010-12-20 LAB — GLUCOSE, CAPILLARY
Glucose-Capillary: 134 mg/dL — ABNORMAL HIGH (ref 70–99)
Glucose-Capillary: 139 mg/dL — ABNORMAL HIGH (ref 70–99)
Glucose-Capillary: 157 mg/dL — ABNORMAL HIGH (ref 70–99)
Glucose-Capillary: 166 mg/dL — ABNORMAL HIGH (ref 70–99)
Glucose-Capillary: 172 mg/dL — ABNORMAL HIGH (ref 70–99)
Glucose-Capillary: 180 mg/dL — ABNORMAL HIGH (ref 70–99)
Glucose-Capillary: 183 mg/dL — ABNORMAL HIGH (ref 70–99)
Glucose-Capillary: 191 mg/dL — ABNORMAL HIGH (ref 70–99)
Glucose-Capillary: 205 mg/dL — ABNORMAL HIGH (ref 70–99)
Glucose-Capillary: 215 mg/dL — ABNORMAL HIGH (ref 70–99)

## 2010-12-20 LAB — BASIC METABOLIC PANEL
CO2: 35 mEq/L — ABNORMAL HIGH (ref 19–32)
Chloride: 96 mEq/L (ref 96–112)
Glucose, Bld: 143 mg/dL — ABNORMAL HIGH (ref 70–99)
Potassium: 4.1 mEq/L (ref 3.5–5.1)
Sodium: 136 mEq/L (ref 135–145)

## 2010-12-20 LAB — DIFFERENTIAL
Basophils Absolute: 0 10*3/uL (ref 0.0–0.1)
Basophils Relative: 0 % (ref 0–1)
Eosinophils Relative: 0 % (ref 0–5)
Lymphocytes Relative: 22 % (ref 12–46)
Monocytes Absolute: 0.2 10*3/uL (ref 0.1–1.0)
Monocytes Relative: 3 % (ref 3–12)

## 2010-12-20 LAB — POCT CARDIAC MARKERS: Troponin i, poc: <0.05 ng/mL (ref 0.00–0.09)

## 2010-12-20 LAB — CBC
HCT: 41.3 % (ref 39.0–52.0)
Hemoglobin: 13.6 g/dL (ref 13.0–17.0)
MCHC: 32.8 g/dL (ref 30.0–36.0)
RDW: 13.6 % (ref 11.5–15.5)

## 2010-12-28 ENCOUNTER — Emergency Department (HOSPITAL_COMMUNITY)
Admission: EM | Admit: 2010-12-28 | Discharge: 2010-12-28 | Disposition: A | Discharge status: Home / Self Care | Payer: Medicare HMO | Attending: Emergency Medicine | Admitting: Emergency Medicine

## 2010-12-28 ENCOUNTER — Emergency Department (HOSPITAL_COMMUNITY): Payer: Medicare HMO

## 2010-12-28 DIAGNOSIS — J449 Chronic obstructive pulmonary disease, unspecified: Secondary | ICD-10-CM | POA: Insufficient documentation

## 2010-12-28 DIAGNOSIS — Z79899 Other long term (current) drug therapy: Secondary | ICD-10-CM | POA: Insufficient documentation

## 2010-12-28 DIAGNOSIS — R0602 Shortness of breath: Secondary | ICD-10-CM | POA: Insufficient documentation

## 2010-12-28 DIAGNOSIS — J4489 Other specified chronic obstructive pulmonary disease: Secondary | ICD-10-CM | POA: Insufficient documentation

## 2010-12-28 DIAGNOSIS — E119 Type 2 diabetes mellitus without complications: Secondary | ICD-10-CM | POA: Insufficient documentation

## 2010-12-28 DIAGNOSIS — E7800 Pure hypercholesterolemia, unspecified: Secondary | ICD-10-CM | POA: Insufficient documentation

## 2010-12-28 LAB — BASIC METABOLIC PANEL
BUN: 6 mg/dL (ref 6–23)
GFR calc non Af Amer: >60 mL/min (ref >60)
Potassium: 4 mEq/L (ref 3.5–5.1)
Sodium: 138 mEq/L (ref 135–145)

## 2010-12-28 LAB — CBC
MCV: 93.1 fL (ref 78.0–100.0)
Platelets: 187 10*3/uL (ref 150–400)
RDW: 14.1 % (ref 11.5–15.5)
WBC: 10.3 10*3/uL (ref 4.0–10.5)

## 2011-01-14 LAB — BLOOD GAS, ARTERIAL
Acid-Base Excess: 11 mmol/L — ABNORMAL HIGH (ref 0.0–2.0)
Acid-Base Excess: 12.1 mmol/L — ABNORMAL HIGH (ref 0.0–2.0)
Acid-Base Excess: 12.2 mmol/L — ABNORMAL HIGH (ref 0.0–2.0)
Acid-Base Excess: 12.2 mmol/L — ABNORMAL HIGH (ref 0.0–2.0)
Acid-Base Excess: 12.3 mmol/L — ABNORMAL HIGH (ref 0.0–2.0)
Acid-Base Excess: 8.8 mmol/L — ABNORMAL HIGH (ref 0.0–2.0)
Bicarbonate: 35.2 mEq/L — ABNORMAL HIGH (ref 20.0–24.0)
Bicarbonate: 35.3 mEq/L — ABNORMAL HIGH (ref 20.0–24.0)
Bicarbonate: 36.2 mEq/L — ABNORMAL HIGH (ref 20.0–24.0)
Bicarbonate: 36.5 mEq/L — ABNORMAL HIGH (ref 20.0–24.0)
Bicarbonate: 38.6 mEq/L — ABNORMAL HIGH (ref 20.0–24.0)
Delivery systems: POSITIVE
Delivery systems: POSITIVE
Expiratory PAP: 10
FIO2: 35 %
FIO2: 35 %
FIO2: 50 %
FIO2: 50 %
Inspiratory PAP: 22
MECHVT: 600 mL
MECHVT: 600 mL
MECHVT: 600 mL
Mode: 600
Mode: POSITIVE
O2 Content: 3 L/min
O2 Content: 35 L/min
O2 Saturation: 91.5 %
O2 Saturation: 94.8 %
O2 Saturation: 95.2 %
O2 Saturation: 95.8 %
O2 Saturation: 99.9 %
PEEP: 5 cmH2O
PEEP: 5 cmH2O
Patient temperature: 37
Patient temperature: 37
Patient temperature: 37
Patient temperature: 37
RATE: 12 resp/min
RATE: 12 resp/min
TCO2: 29.2 mmol/L (ref 0–100)
TCO2: 30.7 mmol/L (ref 0–100)
TCO2: 32.5 mmol/L (ref 0–100)
TCO2: 33.2 mmol/L (ref 0–100)
TCO2: 33.3 mmol/L (ref 0–100)
pCO2 arterial: 47.3 mmHg — ABNORMAL HIGH (ref 35.0–45.0)
pCO2 arterial: 55 mmHg — ABNORMAL HIGH (ref 35.0–45.0)
pCO2 arterial: 56.5 mmHg — ABNORMAL HIGH (ref 35.0–45.0)
pCO2 arterial: 60.2 mmHg (ref 35.0–45.0)
pH, Arterial: 7.395 (ref 7.350–7.450)
pH, Arterial: 7.402 (ref 7.350–7.450)
pH, Arterial: 7.407 (ref 7.350–7.450)
pH, Arterial: 7.42 (ref 7.350–7.450)
pH, Arterial: 7.424 (ref 7.350–7.450)
pO2, Arterial: 70.1 mmHg — ABNORMAL LOW (ref 80.0–100.0)
pO2, Arterial: 70.9 mmHg — ABNORMAL LOW (ref 80.0–100.0)
pO2, Arterial: 76.8 mmHg — ABNORMAL LOW (ref 80.0–100.0)
pO2, Arterial: 81.1 mmHg (ref 80.0–100.0)
pO2, Arterial: 87.3 mmHg (ref 80.0–100.0)
pO2, Arterial: 87.6 mmHg (ref 80.0–100.0)

## 2011-01-14 LAB — BASIC METABOLIC PANEL
BUN: 26 mg/dL — ABNORMAL HIGH (ref 6–23)
BUN: 27 mg/dL — ABNORMAL HIGH (ref 6–23)
Calcium: 9 mg/dL (ref 8.4–10.5)
Calcium: 9.4 mg/dL (ref 8.4–10.5)
Calcium: 9.7 mg/dL (ref 8.4–10.5)
Chloride: 99 mEq/L (ref 96–112)
Creatinine, Ser: 0.91 mg/dL (ref 0.4–1.5)
Creatinine, Ser: 1.08 mg/dL (ref 0.4–1.5)
Creatinine, Ser: 1.42 mg/dL (ref 0.4–1.5)
GFR calc Af Amer: >60 mL/min (ref >60)
GFR calc Af Amer: >60 mL/min (ref >60)
GFR calc non Af Amer: 52 mL/min — ABNORMAL LOW (ref >60)
GFR calc non Af Amer: >60 mL/min (ref >60)
Glucose, Bld: 205 mg/dL — ABNORMAL HIGH (ref 70–99)
Sodium: 138 mEq/L (ref 135–145)

## 2011-01-14 LAB — GLUCOSE, CAPILLARY
Glucose-Capillary: 115 mg/dL — ABNORMAL HIGH (ref 70–99)
Glucose-Capillary: 151 mg/dL — ABNORMAL HIGH (ref 70–99)
Glucose-Capillary: 174 mg/dL — ABNORMAL HIGH (ref 70–99)
Glucose-Capillary: 190 mg/dL — ABNORMAL HIGH (ref 70–99)
Glucose-Capillary: 203 mg/dL — ABNORMAL HIGH (ref 70–99)
Glucose-Capillary: 211 mg/dL — ABNORMAL HIGH (ref 70–99)
Glucose-Capillary: 215 mg/dL — ABNORMAL HIGH (ref 70–99)
Glucose-Capillary: 221 mg/dL — ABNORMAL HIGH (ref 70–99)
Glucose-Capillary: 233 mg/dL — ABNORMAL HIGH (ref 70–99)
Glucose-Capillary: 235 mg/dL — ABNORMAL HIGH (ref 70–99)
Glucose-Capillary: 235 mg/dL — ABNORMAL HIGH (ref 70–99)
Glucose-Capillary: 237 mg/dL — ABNORMAL HIGH (ref 70–99)
Glucose-Capillary: 241 mg/dL — ABNORMAL HIGH (ref 70–99)
Glucose-Capillary: 246 mg/dL — ABNORMAL HIGH (ref 70–99)
Glucose-Capillary: 251 mg/dL — ABNORMAL HIGH (ref 70–99)
Glucose-Capillary: 258 mg/dL — ABNORMAL HIGH (ref 70–99)
Glucose-Capillary: 261 mg/dL — ABNORMAL HIGH (ref 70–99)
Glucose-Capillary: 261 mg/dL — ABNORMAL HIGH (ref 70–99)
Glucose-Capillary: 280 mg/dL — ABNORMAL HIGH (ref 70–99)
Glucose-Capillary: 280 mg/dL — ABNORMAL HIGH (ref 70–99)
Glucose-Capillary: 282 mg/dL — ABNORMAL HIGH (ref 70–99)
Glucose-Capillary: 293 mg/dL — ABNORMAL HIGH (ref 70–99)
Glucose-Capillary: 311 mg/dL — ABNORMAL HIGH (ref 70–99)
Glucose-Capillary: 316 mg/dL — ABNORMAL HIGH (ref 70–99)
Glucose-Capillary: 330 mg/dL — ABNORMAL HIGH (ref 70–99)
Glucose-Capillary: 347 mg/dL — ABNORMAL HIGH (ref 70–99)
Glucose-Capillary: 355 mg/dL — ABNORMAL HIGH (ref 70–99)

## 2011-01-14 LAB — DIFFERENTIAL
Basophils Absolute: 0 10*3/uL (ref 0.0–0.1)
Basophils Absolute: 0 10*3/uL (ref 0.0–0.1)
Basophils Relative: 0 % (ref 0–1)
Basophils Relative: 0 % (ref 0–1)
Basophils Relative: 0 % (ref 0–1)
Eosinophils Absolute: 0 10*3/uL (ref 0.0–0.7)
Eosinophils Relative: 0 % (ref 0–5)
Lymphocytes Relative: 6 % — ABNORMAL LOW (ref 12–46)
Lymphocytes Relative: 8 % — ABNORMAL LOW (ref 12–46)
Lymphs Abs: 0.7 10*3/uL (ref 0.7–4.0)
Lymphs Abs: 0.8 10*3/uL (ref 0.7–4.0)
Monocytes Absolute: 0.5 10*3/uL (ref 0.1–1.0)
Monocytes Absolute: 0.6 10*3/uL (ref 0.1–1.0)
Monocytes Relative: 5 % (ref 3–12)
Monocytes Relative: 5 % (ref 3–12)
Neutro Abs: 11.5 10*3/uL — ABNORMAL HIGH (ref 1.7–7.7)
Neutro Abs: 14.9 10*3/uL — ABNORMAL HIGH (ref 1.7–7.7)
Neutro Abs: 6.6 10*3/uL (ref 1.7–7.7)
Neutro Abs: 8.9 10*3/uL — ABNORMAL HIGH (ref 1.7–7.7)
Neutrophils Relative %: 83 % — ABNORMAL HIGH (ref 43–77)
Neutrophils Relative %: 89 % — ABNORMAL HIGH (ref 43–77)
Neutrophils Relative %: 94 % — ABNORMAL HIGH (ref 43–77)
WBC Morphology: INCREASED

## 2011-01-14 LAB — CBC
Hemoglobin: 13.2 g/dL (ref 13.0–17.0)
MCHC: 34.1 g/dL (ref 30.0–36.0)
MCV: 93.1 fL (ref 78.0–100.0)
Platelets: 202 10*3/uL (ref 150–400)
Platelets: 212 10*3/uL (ref 150–400)
Platelets: 219 10*3/uL (ref 150–400)
RBC: 4.15 MIL/uL — ABNORMAL LOW (ref 4.22–5.81)
RBC: 4.4 MIL/uL (ref 4.22–5.81)
RDW: 14.1 % (ref 11.5–15.5)
RDW: 14.3 % (ref 11.5–15.5)
RDW: 14.7 % (ref 11.5–15.5)
WBC: 15.9 10*3/uL — ABNORMAL HIGH (ref 4.0–10.5)

## 2011-01-14 LAB — COMPREHENSIVE METABOLIC PANEL
ALT: 16 U/L (ref 0–53)
Albumin: 2.5 g/dL — ABNORMAL LOW (ref 3.5–5.2)
Alkaline Phosphatase: 67 U/L (ref 39–117)
BUN: 30 mg/dL — ABNORMAL HIGH (ref 6–23)
Potassium: 4.3 mEq/L (ref 3.5–5.1)
Sodium: 138 mEq/L (ref 135–145)
Total Protein: 5.7 g/dL — ABNORMAL LOW (ref 6.0–8.3)

## 2011-01-14 LAB — CULTURE, RESPIRATORY W GRAM STAIN

## 2011-01-14 LAB — MAGNESIUM
Magnesium: 3.1 mg/dL — ABNORMAL HIGH (ref 1.5–2.5)
Magnesium: 3.2 mg/dL — ABNORMAL HIGH (ref 1.5–2.5)

## 2011-01-14 LAB — BRAIN NATRIURETIC PEPTIDE: Pro B Natriuretic peptide (BNP): <30 pg/mL (ref 0.0–100.0)

## 2011-01-14 LAB — PHOSPHORUS
Phosphorus: 1.4 mg/dL — ABNORMAL LOW (ref 2.3–4.6)
Phosphorus: 3 mg/dL (ref 2.3–4.6)

## 2011-01-18 LAB — BLOOD GAS, ARTERIAL
Acid-Base Excess: 10.5 mmol/L — ABNORMAL HIGH (ref 0.0–2.0)
Acid-Base Excess: 11.6 mmol/L — ABNORMAL HIGH (ref 0.0–2.0)
Acid-Base Excess: 13.1 mmol/L — ABNORMAL HIGH (ref 0.0–2.0)
Bicarbonate: 37.1 mEq/L — ABNORMAL HIGH (ref 20.0–24.0)
Bicarbonate: 38.9 mEq/L — ABNORMAL HIGH (ref 20.0–24.0)
Delivery systems: POSITIVE
Delivery systems: POSITIVE
Delivery systems: POSITIVE
Expiratory PAP: 6
Expiratory PAP: 6
Inspiratory PAP: 12
Inspiratory PAP: 12
Inspiratory PAP: 16
O2 Content: 3 L/min
O2 Saturation: 92.9 %
O2 Saturation: 93.4 %
O2 Saturation: 93.5 %
O2 Saturation: 95.1 %
Patient temperature: 37
Patient temperature: 37
Patient temperature: 37
TCO2: 33.1 mmol/L (ref 0–100)
TCO2: 33.9 mmol/L (ref 0–100)
TCO2: 37.5 mmol/L (ref 0–100)
pCO2 arterial: 70.4 mmHg (ref 35.0–45.0)
pH, Arterial: 7.337 — ABNORMAL LOW (ref 7.350–7.450)
pH, Arterial: 7.37 (ref 7.350–7.450)
pO2, Arterial: 80.8 mmHg (ref 80.0–100.0)

## 2011-01-18 LAB — GLUCOSE, CAPILLARY
Glucose-Capillary: 221 mg/dL — ABNORMAL HIGH (ref 70–99)
Glucose-Capillary: 279 mg/dL — ABNORMAL HIGH (ref 70–99)
Glucose-Capillary: 285 mg/dL — ABNORMAL HIGH (ref 70–99)
Glucose-Capillary: 290 mg/dL — ABNORMAL HIGH (ref 70–99)
Glucose-Capillary: 390 mg/dL — ABNORMAL HIGH (ref 70–99)
Glucose-Capillary: 400 mg/dL — ABNORMAL HIGH (ref 70–99)
Glucose-Capillary: 468 mg/dL — ABNORMAL HIGH (ref 70–99)

## 2011-01-18 LAB — DIFFERENTIAL
Basophils Absolute: 0 10*3/uL (ref 0.0–0.1)
Basophils Absolute: 0 10*3/uL (ref 0.0–0.1)
Basophils Relative: 0 % (ref 0–1)
Basophils Relative: 0 % (ref 0–1)
Lymphocytes Relative: 20 % (ref 12–46)
Monocytes Absolute: 1 10*3/uL (ref 0.1–1.0)
Neutro Abs: 6.3 10*3/uL (ref 1.7–7.7)
Neutro Abs: 8.5 10*3/uL — ABNORMAL HIGH (ref 1.7–7.7)
Neutrophils Relative %: 68 % (ref 43–77)
Neutrophils Relative %: 90 % — ABNORMAL HIGH (ref 43–77)

## 2011-01-18 LAB — BASIC METABOLIC PANEL
BUN: 11 mg/dL (ref 6–23)
CO2: 37 mEq/L — ABNORMAL HIGH (ref 19–32)
Calcium: 10 mg/dL (ref 8.4–10.5)
Calcium: 9.9 mg/dL (ref 8.4–10.5)
Creatinine, Ser: 0.87 mg/dL (ref 0.4–1.5)
Creatinine, Ser: 0.96 mg/dL (ref 0.4–1.5)
GFR calc Af Amer: >60 mL/min (ref >60)
GFR calc Af Amer: >60 mL/min (ref >60)
GFR calc non Af Amer: >60 mL/min (ref >60)
Glucose, Bld: 151 mg/dL — ABNORMAL HIGH (ref 70–99)
Glucose, Bld: 289 mg/dL — ABNORMAL HIGH (ref 70–99)
Sodium: 136 mEq/L (ref 135–145)

## 2011-01-18 LAB — CBC
Hemoglobin: 15 g/dL (ref 13.0–17.0)
MCHC: 33.7 g/dL (ref 30.0–36.0)
Platelets: 271 10*3/uL (ref 150–400)
RBC: 4.94 MIL/uL (ref 4.22–5.81)
RDW: 13.4 % (ref 11.5–15.5)
RDW: 13.7 % (ref 11.5–15.5)

## 2011-01-18 LAB — POCT CARDIAC MARKERS
CKMB, poc: 6.5 ng/mL (ref 1.0–8.0)
Troponin i, poc: <0.05 ng/mL (ref 0.00–0.09)

## 2011-01-18 LAB — CULTURE, BLOOD (ROUTINE X 2)
Culture: NO GROWTH
Report Status: 1302010

## 2011-01-19 LAB — BLOOD GAS, ARTERIAL
Acid-Base Excess: 3.2 mmol/L — ABNORMAL HIGH (ref 0.0–2.0)
Bicarbonate: 30.3 mEq/L — ABNORMAL HIGH (ref 20.0–24.0)
Bicarbonate: 34.1 mEq/L — ABNORMAL HIGH (ref 20.0–24.0)
O2 Saturation: 96.2 %
O2 Saturation: 97.1 %
Patient temperature: 98.6
TCO2: 27.3 mmol/L (ref 0–100)
pCO2 arterial: 81.8 mmHg (ref 35.0–45.0)
pH, Arterial: 7.33 — ABNORMAL LOW (ref 7.350–7.450)
pO2, Arterial: 94.5 mmHg (ref 80.0–100.0)

## 2011-01-19 LAB — GLUCOSE, CAPILLARY
Glucose-Capillary: 228 mg/dL — ABNORMAL HIGH (ref 70–99)
Glucose-Capillary: 258 mg/dL — ABNORMAL HIGH (ref 70–99)
Glucose-Capillary: 363 mg/dL — ABNORMAL HIGH (ref 70–99)

## 2011-01-19 LAB — DIFFERENTIAL
Basophils Absolute: 0 10*3/uL (ref 0.0–0.1)
Eosinophils Absolute: 0 10*3/uL (ref 0.0–0.7)
Eosinophils Relative: 0 % (ref 0–5)
Lymphs Abs: 0.2 10*3/uL — ABNORMAL LOW (ref 0.7–4.0)

## 2011-01-19 LAB — CULTURE, BLOOD (ROUTINE X 2)
Culture: NO GROWTH
Report Status: 3042010
Report Status: 3042010

## 2011-01-19 LAB — CBC
HCT: 41.4 % (ref 39.0–52.0)
MCHC: 33.7 g/dL (ref 30.0–36.0)
MCV: 93.5 fL (ref 78.0–100.0)
Platelets: 199 10*3/uL (ref 150–400)
WBC: 4.9 10*3/uL (ref 4.0–10.5)

## 2011-01-19 LAB — BASIC METABOLIC PANEL
BUN: 7 mg/dL (ref 6–23)
CO2: 30 mEq/L (ref 19–32)
Chloride: 89 mEq/L — ABNORMAL LOW (ref 96–112)
Creatinine, Ser: 0.93 mg/dL (ref 0.4–1.5)
Glucose, Bld: 174 mg/dL — ABNORMAL HIGH (ref 70–99)
Potassium: 4 mEq/L (ref 3.5–5.1)

## 2011-01-19 LAB — BRAIN NATRIURETIC PEPTIDE: Pro B Natriuretic peptide (BNP): 38.7 pg/mL (ref 0.0–100.0)

## 2011-02-16 NOTE — Group Therapy Note (Signed)
NAME:  NESHAWN, AIRD                 ACCOUNT NO.:  [id number]   MEDICAL RECORD NO.:  [id number]           PATIENT TYPE:  INP   LOCATION:  A322                          FACILITY:  [facility]   PHYSICIAN:  Angus G. Renard Matter, MD   DATE OF BIRTH:  02-28-1956   DATE OF PROCEDURE:  DATE OF DISCHARGE:                                 PROGRESS NOTE   This patient continues to feel better, stable, getting out of bed and  moving around some.  He has been off the ventilator.   OBJECTIVE:  VITAL SIGNS:  Blood pressure 131/73, respirations 24, pulse  90, temperature 98.9, O2 sats ranged from 93-99%.  Most recent set of  blood gases showed pH of 7.402, pCO2 55.4, pO2 of 71.8.  Blood sugars  ranged from 191-261.  LUNGS:  Diminished breath sounds.  HEART:  Regular rhythm.  ABDOMEN:  No palpable organs or masses.   ASSESSMENT:  The patient does have chronic obstructive pulmonary  disease, recent pneumonia, recently been on respirator for respiratory  failure.  He does have diabetes.   PLAN:  To continue current regimen.  Continue to monitor blood sugar.      Angus G. Renard Matter, MD  Electronically Signed     AGM/MEDQ  D:  12/10/2008  T:  12/10/2008  Job:  [id number]

## 2011-02-16 NOTE — Group Therapy Note (Signed)
NAME:  Jeff Ray, Jeff Ray NO.:  [id number]   MEDICAL RECORD NO.:  [id number]          PATIENT TYPE:  INP   LOCATION:  A331                          FACILITY:  [facility]   PHYSICIAN:  Edward L. Juanetta Gosling, M.D.DATE OF BIRTH:  1956/05/18   DATE OF PROCEDURE:  DATE OF DISCHARGE:                                 PROGRESS NOTE   Jeff Ray is a patient of Dr. Renard Matter who is admitted with COPD  exacerbation.  He seems to be doing somewhat better.   PHYSICAL EXAMINATION:  VITALS: His physical examination shows that his  temperature is 98.2, pulse 108, respirations 28, blood pressure 136/79,  O2 sat 100% on 2 L.  CHEST: A little clear.   LABORATORY DATA:  Blood gas this morning, pH 7.33, pCO2 of 64, pO2 of  63.  I suspect that it is much closer to his baseline.   ASSESSMENT:  He has chronic obstructive pulmonary disease with  respiratory failure.   PLAN:  Plan is to continue with his meds and treatments and follow.      Edward L. Juanetta Gosling, M.D.  Electronically Signed     ELH/MEDQ  D:  02/10/2008  T:  02/11/2008  Job:  [id number]

## 2011-02-16 NOTE — Group Therapy Note (Signed)
NAME:  Jeff Ray, Jeff Ray              ACCOUNT NO.:  [id number]   MEDICAL RECORD NO.:  [id number]         PATIENT TYPE:  PINP   LOCATION:  A336                          FACILITY:  [facility]   PHYSICIAN:  Angus G. Renard Matter, MD   DATE OF BIRTH:  02/18/1956   DATE OF PROCEDURE:  11/01/2008  DATE OF DISCHARGE:                                 PROGRESS NOTE   This patient appears to be more alert.  Uses oxygen instead of BiPAP  each evening.  He does have acute pneumonia, COPD, and asthma, which  seems to be improving some.   OBJECTIVE:  VITAL SIGNS:  Blood pressure 120/77, respirations 18, pulse  80, temperature 97.1, O2 sats ranged from 93-98%.  Blood sugars 311-85.  LUNGS:  Rhonchi bilaterally.  Diminished breath sounds.  HEART:  Regular rhythm.  ABDOMEN:  No palpable organs or masses.  EXTREMITIES:  No ankle edema.   ASSESSMENT:  The patient continues to improve.  Does have a problem what  apparently his diabetes with elevated blood sugars.  This is being  treated with long and short acting insulin.  The patient is learning to  give his shots.   PLAN:  To continue current regimen.      Angus G. Renard Matter, MD  Electronically Signed     AGM/MEDQ  D:  11/01/2008  T:  11/01/2008  Job:  [id number]

## 2011-02-16 NOTE — Group Therapy Note (Signed)
NAME:  Jeff Ray, Jeff Ray                 ACCOUNT NO.:  [id number]   MEDICAL RECORD NO.:  [id number]           PATIENT TYPE:  INP   LOCATION:  A303                          FACILITY:  [facility]   PHYSICIAN:  Angus G. Renard Matter, MD   DATE OF BIRTH:  10/24/1955   DATE OF PROCEDURE:  DATE OF DISCHARGE:                                 PROGRESS NOTE   This patient was admitted with difficulty breathing, new onset  bronchopneumonia, chronic obstructive pulmonary disease.  X-ray did show  left lower lobe infiltrate.   OBJECTIVE:  VITAL SIGNS:  Blood pressure 132/80, respirations 22, pulse  98, temperature 97.9, O2 sats ranged from 93-98%.  LUNGS:  Rhonchi bilaterally.  HEART:  Regular rhythm.  ABDOMEN:  No palpable organs or masses.   Most recent blood gases; pH 7.243 with a PCO2 81.8, PO2 94.5.  Blood  sugars range from 161-363.   ASSESSMENT:  The patient was admitted with above-stated problems.  He is  still having some difficulty with his breathing.  We will continue  current regimen.  The patient had been placed on BiPAP.  We will obtain  Pulmonary consult and will get another set of blood gases.  We will  increase steroid dose to 125 mg q.8 h.      Angus G. Renard Matter, MD  Electronically Signed     AGM/MEDQ  D:  12/02/2008  T:  12/02/2008  Job:  [id number]

## 2011-02-16 NOTE — Group Therapy Note (Signed)
NAME:  Jeff Ray, Jeff Ray NO.:  [id number]   MEDICAL RECORD NO.:  [id number]          PATIENT TYPE:  INP   LOCATION:  A336                          FACILITY:  [facility]   PHYSICIAN:  Edward L. Juanetta Gosling, M.D.DATE OF BIRTH:  [birth year]   DATE OF PROCEDURE:  DATE OF DISCHARGE:                                 PROGRESS NOTE   Jeff Ray is much improved from last night.  He has no new complaints  and states that he is feeling better.  His blood sugar is up this  morning.   PHYSICAL EXAMINATION:  VITAL SIGNS:  Temperature is 97.3, pulse 86,  respirations 22, blood pressure 122/74, and O2 sat is 97%, this was on  the BiPAP.  It was 88% on room air.  CHEST:  Much clear than yesterday.  HEART:  Regular and he looks much improved.   LABORATORY DATA:  CBC this morning; white count is 9500, hemoglobin  14.4, and platelets 271.  BMET showed his glucose was 289, BUN 11,  creatinine 0.96 and his blood gas much improved with his PCO2 down in  the 60s.  A pH is 7.35, PCO2 is 68, and PO2 is 71.   ASSESSMENT:  He has acute on chronic respiratory failure, but seems to  have improved.   PLAN:  My plan then, he is to continue with his treatments and  medications.  No changes today.      Edward L. Juanetta Gosling, M.D.  Electronically Signed     ELH/MEDQ  D:  10/29/2008  T:  10/30/2008  Job:  [id number]

## 2011-02-16 NOTE — Group Therapy Note (Signed)
NAME:  Jeff Ray, Jeff Ray NO.:  [id number]   MEDICAL RECORD NO.:  [id number]          PATIENT TYPE:  INP   LOCATION:  A336                          FACILITY:  [facility]   PHYSICIAN:  Edward L. Juanetta Gosling, M.D.DATE OF BIRTH:  1956-02-12   DATE OF PROCEDURE:  DATE OF DISCHARGE:                                 PROGRESS NOTE   Patient of Dr. Renard Matter.   Mr. Jeff Ray says he feels better.  He has no new complaints.  He is still  somewhat short of breath.  He is still using BiPAP.   PHYSICAL EXAMINATION:  GENERAL:  He is more alert.  He is using his  BiPAP.  VITAL SIGNS:  Temperature is 97.7, pulse of 90, respirations 22, blood  pressure 130/78, and O2 sats 99% on 3 L.  ABDOMEN:  Soft.   My plan then is to continue with his treatments.  He will need a sleep  study before he can have BiPAP at home, but I think he is going to need  that.  I do not plan to change anything else.      Edward L. Juanetta Gosling, M.D.  Electronically Signed     ELH/MEDQ  D:  10/30/2008  T:  10/30/2008  Job:  [id number]

## 2011-02-16 NOTE — H&P (Signed)
NAME:  Jeff Ray, Jeff Ray NO.:  [id number]   MEDICAL RECORD NO.:  [id number]          PATIENT TYPE:  INP   LOCATION:  A336                          FACILITY:  [facility]   PHYSICIAN:  Angus G. McInnis, MD   DATE OF BIRTH:  17-Aug-1956   DATE OF ADMISSION:  DATE OF DISCHARGE:  LH                              HISTORY & PHYSICAL   A 55 year old African American male, was admitted through the ED with  the chief problem being shortness of breath, which had become  progressively worse over the past few days, does have a history of COPD  and asthma in the past, has been hospitalized here previously, does have  a documented history of asthma, COPD, and bronchitis.  The patient was  seen by ED physician.  Blood gases on admission showed a pH of 7.370  with a pCO2 68.9 and O2 68.6, bicarb 38.9.  CBC showed WBC of 9300 with  hemoglobin 15.0, hematocrit 45.8.  Cardiac markers, CPK, MB 6.5,  troponin less than 0.05, myoglobin 160, BMET essentially within normal  limits.  Subsequent blood gas 15.05, pH of 7.336 with pCO2 of 71.4, pO2  80.0.  The patient was treated in ED.  BiPAP was started 10/6.  X-rays  showed patchy opacity in left lung base suspicious for pneumonia and  cultures were obtained.  Antibiotics started.  The patient was  subsequently admitted.   SOCIAL HISTORY:  The patient formerly drank alcohol and used drugs.  He  is a cigarette smoker.   PAST MEDICAL HISTORY:  Asthma, COPD, and bronchitis.   SURGICAL HISTORY:  Neck surgery for disk.   ALLERGIES:  No known drug allergies.   MEDICATION LIST:  Ibuprofen 800 mg as needed. Mucinex daily, Allegra 180  mg daily, alprazolam 0.5 mg as needed, Singulair 10 mg daily, Vicodin  7.5/750 q.4 h p.r.n. as needed, ProAir p.r.n., Symbicort p.r.n.   REVIEW OF SYSTEMS:  HEENT: Negative.  CARDIOPULMONARY:  The patient has  had chest pain and dyspnea, cough.  GI:  No bowel irregularity or  bleeding.  GU:  No dysuria, hematuria.   PHYSICAL EXAMINATION:  GENERAL:  Dyspneic male.  VITAL SIGNS:  BP __________, pulse __________, respiration __________,  and temperature __________.  HEENT:  Eyes, PERRLA.  TMs negative.  Oropharynx benign.  NECK:  Supple.  No JVD or thyroid abnormalities.  HEART:  Regular rhythm, no murmurs.  LUNGS:  Diminished breath sounds bilaterally.  Rhonchi and wheezes heard  bilaterally.  ABDOMEN:  No palpable organs or masses.  No organomegaly.  EXTREMITIES:  Free of edema.  NEUROLOGIC:  No focal deficit.  SKIN:  Warm and dry.   ASSESSMENT:  The patient does have a history of asthma and COPD, was  thought to have left lower lung infiltrate, probable pneumonia.   PLAN:  To continue BiPAP, to monitor O2 sats, and obtain blood gases.      Angus G. Renard Matter, MD  Electronically Signed     AGM/MEDQ  D:  10/28/2008  T:  10/29/2008  Job:  [id number]

## 2011-02-16 NOTE — Group Therapy Note (Signed)
NAME:  Jeff Ray, Jeff Ray               ACCOUNT NO.:  [id number]   MEDICAL RECORD NO.:  [id number]         PATIENT TYPE:  PINP   LOCATION:  A331                          FACILITY:  [facility]   PHYSICIAN:  Angus G. Renard Matter, MD   DATE OF BIRTH:  02-23-1956   DATE OF PROCEDURE:  02/09/2008  DATE OF DISCHARGE:                                 PROGRESS NOTE   This patient was admitted through the office yesterday with respiratory  distress.  He does have a history of chronic obstructive pulmonary  disease, and this was thought to be in exacerbation.   A chest x-ray showed evidence of emphysematous changes without acute  findings.  The patient's blood gas yesterday shows pH of 7.309 with a  pCO2 of 72.9, pO2 of 67.9, and oxygen saturation 93.1.  Current blood  gas, pH was 7.278 with a pCO2 74.3 and oxygen saturation 93.9.   PHYSICAL EXAMINATION:  LUNGS:  Rhonchi bilaterally.  Diminished breath  sounds.  Prolonged expiratory phase of respiration.  HEART: Sinus tachycardia.  ABDOMEN: No palpable organs or masses.   ASSESSMENT:  The patient was admitted with exacerbation of chronic  obstructive pulmonary disease and hypercapnia.   PLAN:  Continue aggressive treatment of respiratory problems with  intravenous steroids and neb treatments, etc.  Close monitoring of blood  gases.      Angus G. Renard Matter, MD  Electronically Signed     AGM/MEDQ  D:  02/09/2008  T:  02/09/2008  Job:  [id number]

## 2011-02-16 NOTE — Group Therapy Note (Signed)
NAME:  Jeff Ray, Jeff Ray                 ACCOUNT NO.:  [id number]   MEDICAL RECORD NO.:  [id number]          PATIENT TYPE:  INP   LOCATION:  IC08                          FACILITY:  [facility]   PHYSICIAN:  Edward L. Juanetta Gosling, M.D.DATE OF BIRTH:  10/22/1955   DATE OF PROCEDURE:  DATE OF DISCHARGE:                                 PROGRESS NOTE   Mr. Fischler is overall I think about the same.  We attempted weaning  yesterday, but he became very agitated.  This morning, he is still  intubated, sedated on the ventilator.  He has not had any other new  problems noted through the night.   PHYSICAL EXAMINATION:  GENERAL:  As mentioned, he is intubated and  sedated.  VITAL SIGNS:  Pulse within the 80s, blood pressure about 120/80.  CHEST:  Clear.  HEART:  Regular without gallop.  ABDOMEN:  Soft.   His laboratory work this morning, his blood gas on 35% 600 at rate of 12  shows PO2 of 76, PCO2 of 56, pH 7.43.  Phosphorus is 3.7, magnesium is  3.2.  His electrolytes show his CO2 is 37, glucose is 230, BUN is 30,  creatinine 1.21, and his CBC shows white count 10,200, hemoglobin 12.3,  platelets 212.   Chest x-ray shows improved aeration in the base.   ASSESSMENT:  He is better.   PLAN:  Continue with his meds and treatments.  I do not plan to change  anything today.  We will see if he can wean.      Edward L. Juanetta Gosling, M.D.  Electronically Signed     ELH/MEDQ  D:  12/04/2008  T:  12/04/2008  Job:  [id number]

## 2011-02-16 NOTE — Consult Note (Signed)
NAME:  Jeff Ray, Jeff Ray                 ACCOUNT NO.:  [id number]   MEDICAL RECORD NO.:  [id number]          PATIENT TYPE:  INP   LOCATION:  A336                          FACILITY:  [facility]   PHYSICIAN:  Edward L. Juanetta Gosling, M.D.DATE OF BIRTH:  [birth year]   DATE OF CONSULTATION:  DATE OF DISCHARGE:                                 CONSULTATION   PATIENT OF:  Dr. Renard Matter.   REASON FOR CONSULTATION:  COPD exacerbation.   HISTORY:  Jeff Ray is a 55 year old African American male who was  admitted today with shortness of breath.  He says that he has had this  for about a week and half, he has been coughing, he has been congested.  He has not coughed much of anything up and he has not had any fever or  chills.  He says that he sometimes gets some chest pain with that, this  more of a tightness in the chest.  He has been using breathing  treatments at home.  He has been using oxygen at home, which has not  worked.   PAST MEDICAL HISTORY:  Positive for COPD, multiple bouts of bronchitis,  and multiple hospitalizations.   PAST SURGICAL HISTORY:  He has had neck surgery.   SOCIAL HISTORY:  He does not use any alcohol, although he did in the  past.  He does not use any illicit drugs, although he did in the past.  He continues to smoke about a pack of cigarettes daily despite being on  oxygen.   FAMILY HISTORY:  Positive for COPD.   MEDICATIONS AT HOME:  1. Ibuprofen 800 mg t.i.d.  2. Mucinex 600 mg daily.  3. Allegra 180 mg daily.  4. Xanax 0.5 every 4 hours as needed.  5. Singulair 10 mg daily.  6. Vicodin 7.5/750 every 4 hours as needed.  7. ProAir 1 puff daily.  8. Symbicort, which he says that he takes p.r.n., but which should be      a routinely scheduled medication.   REVIEW OF SYSTEMS:  He has not had any hemoptysis, night sweats, or  weight loss.   PHYSICAL EXAMINATION:  GENERAL:  He is wearing BiPAP, but he is  arousable.  He looks dyspneic.  He is coughing.  VITAL SIGNS:  His  blood pressure 122/70, pulse is in the 80s, his  respirations are about 20, and he is afebrile.  HEENT:  His pupils are equal, round, reactive to light and  accommodation.  His nose and throat are clear.  NECK:  Supple.  He does not have any bruits.  CHEST:  Clear with decreased breath sounds and some rhonchi.  HEART:  Regular.  ABDOMEN:  Soft without masses.  EXTREMITIES:  No edema.  CENTRAL NERVOUS SYSTEM:  Grossly intact.   LABORATORY DATA:  His laboratory work shows that he has what appears to  be an acute on chronic respiratory acidemia.  Chest x-ray shows what may  be pneumonia.  His blood gas showed a pH 7.37, pCO2 of 68, and pO2 of  68.  His white blood count 9300, hemoglobin 15,  and platelets 282.  Electrolytes, his CO2 is elevated suggesting chronic CO2 retention, BUN  11, and creatinine 0.87.   ASSESSMENT:  He has chronic obstructive pulmonary disease with  exacerbation and acute on chronic respiratory failure.  I think we  should continue with his current antibiotics, steroids, inhaled  bronchodilators, and BiPAP.  Repeat his laboratory work later and repeat  again in the morning.  Thanks for allowing me to see him with you.      Edward L. Juanetta Gosling, M.D.  Electronically Signed     ELH/MEDQ  D:  10/28/2008  T:  10/29/2008  Job:  [id number]

## 2011-02-16 NOTE — Consult Note (Signed)
NAME:  ZACKRY, DEINES                 ACCOUNT NO.:  [id number]   MEDICAL RECORD NO.:  [id number]          PATIENT TYPE:  INP   LOCATION:  IC08                          FACILITY:  [facility]   PHYSICIAN:  Edward L. Juanetta Gosling, M.D.DATE OF BIRTH:  [birth year]   DATE OF CONSULTATION:  DATE OF DISCHARGE:                                 CONSULTATION   REASON FOR CONSULTATION:  Respiratory failure.   HISTORY:  Mr. Arganbright is a 55 year old African American male, patient of  Dr. Renard Matter, who was admitted on November 30, 2008, with shortness of  breath.  He has had previous problems with COPD.  He has recently been  diagnosed his diabetes.  He came to the emergency room with increasing  shortness of breath and was admitted from there.  He has been on oxygen  and nebulizer treatments at home.  He has a history of COPD, history of  bronchitis, diabetes, and multiple hospitalizations for all of this.  Surgically, he has had some sort of neck surgery.   SOCIAL HISTORY:  He does not use any alcohol, although apparently he has  had a history of using alcohol in excess in the past.  No illicit drugs,  although he did in the past.  He is apparently stopped smoking now,  although he was smoking up until about a month ago and has a 40+ pack-  year smoking history.   FAMILY HISTORY:  Positive for COPD.   REVIEW OF SYSTEMS:  Except as mentioned is negative.   MEDICATIONS IN THE HOSPITAL:  Rocephin, Zithromax, albuterol, nebulizer  treatments.  He is on insulin sliding scale, Solu-Medrol, Singulair,  Xanax, and hydrocodone.   PHYSICAL EXAMINATION:  GENERAL:  He was initially with a BiPAP mask on.  He appeared to be acutely uncomfortable with a great deal of respiratory  distress.  HEENT:  His pupils are reactive.  His nose and throat clear.  His  dentition was very poor with multiple missing teeth.  VITAL SIGNS:  Blood pressure in the 150s, pulse in the 120s, and O2 sat  was in the 90s.  NECK:  Supple without  masses.  CHEST:  Decreased breath sounds.  Wheezes and rhonchi bilaterally and  labored respirations.  HEART:  Sounds are somewhat obscured by airway noise.  His nose and  throat are clear.  ABDOMEN:  Soft without masses.  EXTREMITIES:  No edema.  CENTRAL NERVOUS SYSTEM:  Grossly intact.   His blood gas on 50% O2 and BiPAP showed pH 7.25, pCO2 of 93, and pO2 of  87.  He has not had any other lab work this morning.  Chest x-ray showed  COPD, diffuse interstitial prominence, but no definite infiltrate.   ASSESSMENT:  He has respiratory failure that is not responding to  bilevel positive airway pressure.   PLAN:  To treat him with intubation and mechanical ventilation.  I have  discussed with him and with his wife.   ADDENDUM:  Mr. Lehenbauer was intubated and received 4 mg of Versed  intravenously,  etomidate 12 mg, succinylcholine 120 mg.  He was  intubated on the second attempt.  He had good bilateral breath sounds.  Good color change on the CO2 indicate.  Chest x-ray is pending.      Edward L. Juanetta Gosling, M.D.  Electronically Signed     ELH/MEDQ  D:  12/02/2008  T:  12/03/2008  Job:  [id number]

## 2011-02-16 NOTE — Group Therapy Note (Signed)
NAME:  Jeff Ray, Jeff Ray NO.:  [id number]   MEDICAL RECORD NO.:  [id number]          PATIENT TYPE:  INP   LOCATION:  A336                          FACILITY:  [facility]   PHYSICIAN:  Edward L. Juanetta Gosling, M.D.DATE OF BIRTH:  02/15/1956   DATE OF PROCEDURE:  DATE OF DISCHARGE:                                 PROGRESS NOTE   Jeff Ray seems to be doing better in general.  He is learning to give  himself insulin shots.  He said something about waiting for a sleep  study, but that will not be done as an inpatient.  Otherwise, he is  better.  He is still bringing up some greenish sputum.  He is still  short of breath.   His exam otherwise shows that he is awake and alert, sitting up, and  using nasal O2.  His chest is clearer than before with some rhonchi  bilaterally.  His heart is regular without gallop.  Temperature is 97.1,  pulse 80, respirations 18, blood pressure 120/77, and O2 sats 95% on 3  L; and his chest is as mentioned clearer.   ASSESSMENT:  Overall, he is better.   PLAN:  He does need a sleep study that will be done as an outpatient.  He is otherwise about the same and I do not plan to change anything  today.      Edward L. Juanetta Gosling, M.D.  Electronically Signed     ELH/MEDQ  D:  11/01/2008  T:  11/01/2008  Job:  [id number]

## 2011-02-16 NOTE — Group Therapy Note (Signed)
NAME:  Jeff Ray, Jeff Ray                 ACCOUNT NO.:  [id number]   MEDICAL RECORD NO.:  [id number]         PATIENT TYPE:  PINP   LOCATION:  IC08                          FACILITY:  [facility]   PHYSICIAN:  Edward L. Juanetta Gosling, M.D.DATE OF BIRTH:  [birth year]   DATE OF PROCEDURE:  DATE OF DISCHARGE:                                 PROGRESS NOTE   Patient of Dr. Renard Matter.  [name] remains intubated on the ventilator.  He is sedated.  We attempted weaning again yesterday and he did a little  bit better, was able to stay off longer, but then developed increasing  problems again.   His physical examination this morning otherwise shows his blood pressure  is 134/85.  His pulse is 79.  He is afebrile with temperature is 37.1.  His INR yesterday was plus or minus 901.  His weight is unchanged at  84.8 kg.  His chest is relatively clear with some rhonchi.  His heart is  regular without murmur, gallop, or rub.  His abdomen is soft.   His laboratory work, blood gas shows pH 7.41, pCO2 of 60, and pO2 of 81.  His electrolytes were normal, CO2 is 37, BUN is 26, and creatinine 0.91.  His white blood count has gone up to 15,900, hemoglobin 14, and  platelets 219.   Assessment then is that he has respiratory failure.  He has severe  chronic obstructive pulmonary disease.  He is diabetic and he is  intubated and sedated on the ventilator.   My plan at this point is to continue with his treatments and see if we  can get him perhaps wean today.       Edward L. Juanetta Gosling, M.D.  Electronically Signed     ELH/MEDQ  D:  12/06/2008  T:  12/06/2008  Job:  [id number]

## 2011-02-16 NOTE — Group Therapy Note (Signed)
NAME:  Jeff Ray, Jeff Ray NO.:  [id number]   MEDICAL RECORD NO.:  [id number]          PATIENT TYPE:  INP   LOCATION:  A336                          FACILITY:  [facility]   PHYSICIAN:  Edward L. Juanetta Gosling, M.D.DATE OF BIRTH:  19-Apr-1956   DATE OF PROCEDURE:  DATE OF DISCHARGE:                                 PROGRESS NOTE   A patient of Dr. Renard Matter.   Jeff Ray seems much better this morning.  He is alert and awake.  He  is using nasal oxygen instead of the BiPAP.  He has no complaints.   PHYSICAL EXAMINATION:  GENERAL:  He is awake and alert.  VITAL SIGNS:  His temperature is 98.3, pulse 85, respirations 20, blood  pressure 129/83, and O2 sats 93% on 3 L.  CHEST:  With decreased breath sounds but clear.  HEART:  Regular.  ABDOMEN:  Soft.   ASSESSMENT:  He is overall much improved.   PLAN:  I agree with Dr. Renard Matter' plan to allow him to remain on the  nasal cannula today, BiPAP tonight, and then decide what we need to do.      Edward L. Juanetta Gosling, M.D.  Electronically Signed     ELH/MEDQ  D:  10/31/2008  T:  11/01/2008  Job:  [id number]

## 2011-02-16 NOTE — H&P (Signed)
NAME:  Jeff Ray, Jeff Ray                 ACCOUNT NO.:  [id number]   MEDICAL RECORD NO.:  [id number]          PATIENT TYPE:  INP   LOCATION:  A303                          FACILITY:  [facility]   PHYSICIAN:  Angus G. Renard Matter, MD   DATE OF BIRTH:  08-24-1956   DATE OF ADMISSION:  11/30/2008  DATE OF DISCHARGE:  LH                              HISTORY & PHYSICAL   This 55 year old male presented to the ED with chief complaint being  shortness of breath.  He does have a history of having been treated here  previously for similar problems, bronchopneumonia, chronic obstructive  pulmonary disease, respiratory failure and asthma, new-onset diabetes.  He did have a chest x-ray, which showed left lower lobe infiltrate or  some point infection or underlying predisposing malignancy.  The patient  was treated in the emergency room and was admitted.   SOCIAL HISTORY:  The patient does not smoke, did drink formally,  normally use drugs.   PAST MEDICAL HISTORY:  History of asthma, COPD, and bronchitis.   SURGICAL HISTORY:  The patient has a history of neck surgery for disk.   ALLERGIES:  No known drug allergies.   MEDICATION LIST:  1. Ibuprofen 800 mg t.i.d.  2. Mucinex daily.  3. Allegra 180 mg daily.  4. Alprazolam 0.5 mg every 4 hours p.r.n.  5. Singulair 10 mg daily.  6. Vicodin 7.5 750 p.r.n.  7. Provera p.r.n.  8. Symbicort one puff b.i.d.   REVIEW OF SYSTEMS:  HEENT:  Negative.  CARDIOPULMONARY:  No chest pain.  RESPIRATORY:  The patient has been coughing, has dyspnea, and wheezing  several days.  GI:  No bowel irregularity or bleeding.  GU:  No dysuria  or hematuria.   PHYSICAL EXAMINATION:  VITAL SIGNS:  Blood pressure 117/74, respirations  18, pulse and 118.  HEENT:  Eyes, PERRLA.  TMs negative.  Oropharynx benign.  NECK:  Supple.  No JVD or thyroid abnormalities.  LUNGS:  Rhonchi and scattered wheeze on both lung fields.  ABDOMEN:  No palpable organs or masses.  No organomegaly.  EXTREMITIES:  Free of edema.   DIAGNOSES:  1. Bronchopneumonia.  2. History of chronic bronchitis.  3. Chronic obstructive pulmonary disease.  4. Diabetes.   PERTINENT LABORATORY DATA:  CBC, normal.  ABG, pH 7.330, pCO2 59.1, and  pO2 91.1.  Blood culture in progress.      Angus G. Renard Matter, MD  Electronically Signed     AGM/MEDQ  D:  12/01/2008  T:  12/01/2008  Job:  [id number]

## 2011-02-16 NOTE — Group Therapy Note (Signed)
NAME:  Jeff Ray, Jeff Ray NO.:  [id number]   MEDICAL RECORD NO.:  [id number]          PATIENT TYPE:  INP   LOCATION:  A331                          FACILITY:  [facility]   PHYSICIAN:  Edward L. Juanetta Gosling, M.D.DATE OF BIRTH:  July 05, 1956   DATE OF PROCEDURE:  DATE OF DISCHARGE:  02/12/2008                                 PROGRESS NOTE   Patient of Dr. Renard Matter.  Mr. Interrante says he feels better.  He is  interested in being discharged today.  He has no other new complaints  and no other new problems.   PHYSICAL EXAMINATION:  His physical exam this morning shows that he  looks comfortable.  Temperature is 97.6, pulse 91, respirations 22,  blood pressure 132/78, and O2 sat varying between 90% and 100% on 2.5 L.  His blood gas this morning also on 2.5 L shows pO2 of 68, pCO2 of 53,  and pH of 7.40, which I think is about his baseline.  His chest is much  clearer.  He does not have any wheezing now.   ASSESSMENT:  He is better.   PLAN:  I think from pulmonary point of view he could be discharged home.      Edward L. Juanetta Gosling, M.D.  Electronically Signed     ELH/MEDQ  D:  02/12/2008  T:  02/12/2008  Job:  [id number]

## 2011-02-16 NOTE — Group Therapy Note (Signed)
NAME:  Jeff Ray, Jeff Ray                 ACCOUNT NO.:  [id number]   MEDICAL RECORD NO.:  [id number]          PATIENT TYPE:  INP   LOCATION:  IC08                          FACILITY:  [facility]   PHYSICIAN:  Edward L. Juanetta Gosling, M.D.DATE OF BIRTH:  [birth year]   DATE OF PROCEDURE:  DATE OF DISCHARGE:                                 PROGRESS NOTE   Patient of Dr. Renard Matter.  Jeff Ray remains in the intensive care unit,  intubated, sedated, and on mechanical ventilation.  His blood gas shows  that he looks like he is about at his baseline.  His chest x-ray seems  to look a little better.  He was started on tube feedings last night.  He has no new complaints noted by the nursing staff today.   His physical examination shows that his blood pressure in the 120  systolic.  His pulse about 80, O2 sats 100%, and he is afebrile.  He is  sedated.  His chest shows some rhonchi bilaterally, more on the right  than on the left.  His heart is regular without murmur, gallop, or rub,  and his abdomen is soft.  He has no edema of the extremities.   His laboratory work does show that he has a blood gases are about back  to baseline.   Assessment then he has respiratory failure.  He has chronic obstructive  pulmonary disease.  He has what appears to be a pneumonia on chest x-ray  which is improving and he is getting better.  We are going to see if he  can wean today.  I do not know if we will be able to come off the  ventilator, but hopefully we get at least get him closer.  He is on the  tube feedings, and I am hopeful that will help.  I would not change any  of the ventilator settings at this point and we will plan to continue  following.      Edward L. Juanetta Gosling, M.D.  Electronically Signed     ELH/MEDQ  D:  12/05/2008  T:  12/06/2008  Job:  [id number]

## 2011-02-16 NOTE — Group Therapy Note (Signed)
NAME:  Jeff Ray, Jeff Ray                 ACCOUNT NO.:  [id number]   MEDICAL RECORD NO.:  [id number]          PATIENT TYPE:  INP   LOCATION:  A303                          FACILITY:  [facility]   PHYSICIAN:  Angus G. Renard Matter, MD   DATE OF BIRTH:  14-Oct-1955   DATE OF PROCEDURE:  DATE OF DISCHARGE:                                 PROGRESS NOTE   This patient appears to be much more comfortable.  He was admitted with  respiratory distress through the ED.  He does have a history of previous  COPD, bronchitis, diabetes, and thought to have possibly a new-onset  pneumonia.   PHYSICAL EXAMINATION:  VITAL SIGNS:  BP 117/74, respirations 28, pulse  118, and temperature 98.5.  LUNGS:  Rhonchi, scattered wheezes bilaterally.  HEART:  Regular rhythm.  ABDOMEN:  No palpable organs or masses.  EXTREMITIES:  Free of edema.   ASSESSMENT:  The patient was admitted with respiratory distress,  bronchitis, asthma, a new-onset pneumonia, and diabetes.   PLAN:  To continue current regimen of antibiotics, neb treatments, etc.      Angus G. Renard Matter, MD  Electronically Signed     AGM/MEDQ  D:  12/01/2008  T:  12/01/2008  Job:  [id number]

## 2011-02-16 NOTE — Group Therapy Note (Signed)
NAME:  Jeff Ray, Jeff Ray                 ACCOUNT NO.:  [id number]   MEDICAL RECORD NO.:  [id number]          PATIENT TYPE:  INP   LOCATION:  IC08                          FACILITY:  [facility]   PHYSICIAN:  Edward L. Juanetta Gosling, M.D.DATE OF BIRTH:  1956-03-02   DATE OF PROCEDURE:  12/07/2008  DATE OF DISCHARGE:                                 PROGRESS NOTE   Jeff Ray seems to be doing better.  He is more awake and alert, and we  are hopeful that he will be able to be extubated later today.  He has no  other new complaints.   PHYSICAL EXAMINATION:  GENERAL:  He is awake, coming off of his  sedation, he is still intubated.  VITAL SIGNS:  His heart rates in the 80s, his blood pressure in the 110  range.  CHEST:  Much clearer and he is motioning that he  thinks he is ready now  to have the endotracheal tube removed.   His pO2 70, pCO2 of 55, pH 7.42.   ASSESSMENT:  He is better and hopefully, he will be able to be  extubated.      Edward L. Juanetta Gosling, M.D.  Electronically Signed     ELH/MEDQ  D:  12/08/2008  T:  12/08/2008  Job:  [id number]

## 2011-02-16 NOTE — Group Therapy Note (Signed)
NAME:  Jeff Ray, Jeff Ray NO.:  [id number]   MEDICAL RECORD NO.:  [id number]          PATIENT TYPE:  INP   LOCATION:  A336                          FACILITY:  [facility]   PHYSICIAN:  Angus G. Renard Matter, MD   DATE OF BIRTH:  1956/02/21   DATE OF PROCEDURE:  DATE OF DISCHARGE:                                 PROGRESS NOTE   This patient was admitted to the hospital with chief problem being acute  pneumonia, COPD, and asthma.  He did have x-ray more recently which  showed patchy opacity of left lung base suspicious for pneumonia.  She  remains on BiPAP.   OBJECTIVE:  VITAL SIGNS:  Blood pressure 129/83, temperature 98.3, pulse  85, respiration 20, O2 sats ranged from 94% to 98%.  Blood sugars ranged from 265 to 299.  HEART:  Regular rhythm.  LUNGS:  Diminished breath sounds bilaterally.  ABDOMEN:  No palpable organs or masses.   ASSESSMENT:  The patient was admitted with above-stated problems.  He is  gradually improving some.  He does have elevated blood sugars and is on  short-acting insulin.  We will continue with these treatments.  Dr.  _Hawkins_________ has suggested a sleep study that should be done prior  to being discharged.      Angus G. Renard Matter, MD  Electronically Signed     AGM/MEDQ  D:  10/31/2008  T:  10/31/2008  Job:  [id number]

## 2011-02-16 NOTE — H&P (Signed)
NAME:  Jeff Ray, Jeff Ray                 ACCOUNT NO.:  [id number]   MEDICAL RECORD NO.:  [id number]           PATIENT TYPE:  INP   LOCATION:  A331                          FACILITY:  [facility]   PHYSICIAN:  Angus G. Renard Matter, MD   DATE OF BIRTH:  Apr 03, 1956   DATE OF ADMISSION:  DATE OF DISCHARGE:  LH                              HISTORY & PHYSICAL   A 55 year old Afro-American male who was seen in the office prior  admission with extreme shortness of breath.  This patient has had a  history of asthmatic bronchitis and COPD over a period of approximately  4 years, has been oxygen-dependent at home, and had been on maximum  medications.  For the past 3-4 days, he had developed increasing dyspnea  and increased respiratory distress.  He was seen in the office prior to  admission, had been on a maximum amount of medications as an outpatient  without relief, and is oxygen dependent with 2 L of oxygen per minute.  He had been using Brovana and Pulmicort as neb treatments without  significant improvement.  O2 sat was in the office of 96%.  He was  admitted for further evaluation.   SOCIAL HISTORY:  The patient was a former cigarette smoker.  Does not  use alcohol.   FAMILY HISTORY:  See previous record.   PAST MEDICAL AND SURGICAL HISTORY:  The patient has been oxygen-  dependent for approximately the past 3-4 years.  Has had moderate-to-  severe COPD and asthma.  Surgery:  The patient has had fusion of  cervical vertebra.   ALLERGIES:  He has no known allergies.   MEDICATIONS:  1. Nasal O2 at 2 L per minute.  2. Mucinex.  3. Singulair 10 mg daily.  4. Provera p.r.n.  5. Brovana and Pulmicort neb treatments.  6. ProAir p.r.n.   REVIEW OF SYSTEMS:  HEENT:  Negative.  CARDIOPULMONARY:  The patient has  had increased dyspnea and cough for the past few days.  Has not had  hemoptysis.  GI:  No bowel irregularity or bleeding.  GU:  No dysuria or  hematuria.   PHYSICAL EXAMINATION:  VITAL SIGNS:   Uncomfortable male with blood  pressure 130/80, pulse 60, respiration 18, and temperature 98.  HEENT:  Eyes, PERRLA.  TMs negative.  Oropharynx benign.  NECK:  Supple.  No JVD or thyroid abnormalities.  HEART:  Regular rhythm.  No murmurs.  LUNGS:  Diminished breath sounds, rhonchi, and scattered wheezes  bilaterally with prolonged expiratory phase of respiration.  ABDOMEN:  No palpable organs or masses.  No organomegaly.  EXTREMITIES:  Free of edema.  SKIN:  Warm and dry.  NEUROLOGIC:  No focal deficit.   DIAGNOSES:  Asthmatic bronchitis, chronic obstructive pulmonary disease,  and respiratory distress.   PLAN:  To start intravenous antibiotics, intravenous steroids, and neb  treatments.  We will monitor O2 sats and proceed accordingly.  We will  obtain a chest x-ray to rule out evidence of pneumonia.      Angus G. Renard Matter, MD  Electronically Signed  AGM/MEDQ  D:  02/08/2008  T:  02/09/2008  Job:  [id number]

## 2011-02-16 NOTE — Group Therapy Note (Signed)
NAME:  Jeff Ray, BECKERS NO.:  [id number]   MEDICAL RECORD NO.:  [id number]          PATIENT TYPE:  INP   LOCATION:  A336                          FACILITY:  [facility]   PHYSICIAN:  Angus G. Renard Matter, MD   DATE OF BIRTH:  21-Mar-1956   DATE OF PROCEDURE:  DATE OF DISCHARGE:                                 PROGRESS NOTE   This patient was admitted to hospital with chief problem being acute  pneumonia, COPD, and asthma.  This patient was admitted through the ED  after having presented there with shortness of breath.  Does have  history of COPD and continues to smoke.  He did have an x-ray evidence  of pneumonia in the left lung base.  Most recent ABG, pH of 7.356 with a  pO2 of 71.9 and pCO2 of 68.6.  The patient has been on BiPAP.   OBJECTIVE:  VITAL SIGNS:  Blood pressure 122/74, respirations 22, pulse  86, and temperature 97.3.  LUNGS:  Wheezes, rhonchi bilaterally.  HEART:  Regular rhythm.  ABDOMEN:  No palpable organs or masses.   ASSESSMENT:  The patient does have chronic obstructive pulmonary  disease, acute pneumonia, left lung base.  The patient has had  functional hypercapnia and chronic obstructive pulmonary disease.   PLAN:  To continue current regimen.  We will continue to monitor blood  gases.  Continue IV antibiotic, steroids intravenously.      Angus G. Renard Matter, MD  Electronically Signed     AGM/MEDQ  D:  10/29/2008  T:  10/29/2008  Job:  [id number]

## 2011-02-16 NOTE — Group Therapy Note (Signed)
NAME:  Jeff Ray, Jeff Ray                 ACCOUNT NO.:  [id number]   MEDICAL RECORD NO.:  [id number]           PATIENT TYPE:  INP   LOCATION:  IC08                          FACILITY:  [facility]   PHYSICIAN:  Angus G. Renard Matter, MD   DATE OF BIRTH:  [birth year]   DATE OF PROCEDURE:  DATE OF DISCHARGE:                                 PROGRESS NOTE   SUBJECTIVE:  This patient was admitted with difficulty breathing.  He  remains on respirator, attempts to wean him yesterday were unsuccessful.  He remained sedated, does have chronic obstructive pulmonary disease and  left lower lobe infiltrate.   OBJECTIVE:  VITAL SIGNS:  Blood pressure 121/79, respirations 12, temp  98.  LUNGS:  Rhonchi bilaterally.  HEART:  Regular rhythm.  ABDOMEN:  No palpable organs or masses.   ASSESSMENT:  The patient remains on ventilator with respiratory failure.  He does have bronchopneumonia, chronic obstructive pulmonary disease,  and diabetes.   PLAN:  Attempts to get him extubated will again be made.  To continue  his other medications otherwise.      Angus G. Renard Matter, MD  Electronically Signed     AGM/MEDQ  D:  12/05/2008  T:  12/05/2008  Job:  [id number]

## 2011-02-16 NOTE — Group Therapy Note (Signed)
NAME:  Jeff Ray, Jeff Ray                 ACCOUNT NO.:  [id number]   MEDICAL RECORD NO.:  [id number]          PATIENT TYPE:  INP   LOCATION:  IC08                          FACILITY:  [facility]   PHYSICIAN:  Edward L. Juanetta Gosling, M.D.DATE OF BIRTH:  12/19/1955   DATE OF PROCEDURE:  12/08/2008  DATE OF DISCHARGE:                                 PROGRESS NOTE   Jeff Ray was able to be successfully extubated yesterday and seems to  be doing much better.  He is sitting up, able to eat full liquids.  He  is still short of breath when he moves around.  He still has some  volatility of his vital signs.   PHYSICAL EXAMINATION:  His blood pressure now is 127/38, pulse 75, he is  afebrile at 36.8.  His I&O so far today -310, yesterday -360.  His  weight which is clearly incorrect was 84 kg, it is now listed at 194 kg.  His weight in pounds was in the 180s, so I think it is just placement of  the weight.  His white blood count last was 15,000.  His chest is clear.  He is awake and alert.   ASSESSMENT:  He has chronic obstructive pulmonary disease which is  severe with acute on chronic respiratory failure requiring mechanical  intubation and ventilation, and he is now recovered from that.  He is  diabetic.   PLAN:  Continue with his treatments and follow.      Edward L. Juanetta Gosling, M.D.  Electronically Signed     ELH/MEDQ  D:  12/08/2008  T:  12/08/2008  Job:  [id number]

## 2011-02-16 NOTE — Consult Note (Signed)
NAME:  Jeff Ray, Jeff Ray NO.:  [id number]   MEDICAL RECORD NO.:  [id number]          PATIENT TYPE:  INP   LOCATION:  A213                          FACILITY:  [facility]   PHYSICIAN:  Edward L. Juanetta Gosling, M.D.DATE OF BIRTH:  29-Aug-1956   DATE OF CONSULTATION:  DATE OF DISCHARGE:                                 CONSULTATION   Patient of Angus G. McInnis, MD.   REASON FOR CONSULTATION:  Chronic obstructive pulmonary disease  exacerbation.   HISTORY:  Mr. Gullo is a 55 year old who has a long known history of  COPD and who has been admitted because of increasing shortness of  breath.  He says that he has had shortness of breath for the last month  or so.  He has got worse, he has had cough, he has had congestion, and  he has taken multiple antibiotics and steroids.  He continues to have  difficulty with his breathing.   PAST MEDICAL HISTORY:  Positive for COPD.  He has been on nebulizer at  home.  He has been on Advair in the past and apparently has been on  oxygen.  Surgically, he has had a cervical disk fusion between the 5th  and the 6th vertebrae.   SOCIAL HISTORY:  He has about a 30-50-pack-year smoking history.  No use  of alcohol.  He said he has had multiple exposures to noxious materials.  He denies any use of illicit drugs.   FAMILY HISTORY:  Mother is living in her 60s.  His father died in his  29s had leukemia.   PHYSICAL EXAMINATION:  A well-developed male who appears to be mildly  dyspneic even at rest.  His temperature is 98.3, pulse 102, respirations  24, blood pressure 155/97, O2 sats 98%.  His pupils are reactive.  His  nose and throat are clear.  His neck is supple without masses.  His  chest clear with decreased breath sounds.  His heart is regular without  gallop.  His abdomen is soft.  No masses are felt.  Extremities showed  no edema.  His central nervous system examination is grossly intact.  He  does say that he thinks that his legs were  swollen yesterday.   LABORATORY WORK:  CBC shows white count 6100, hemoglobin 15.2, platelets  180 with normal differential.  This was on Feb 08, 2008.  Comp metabolic  profile on Feb 08, 2008, shows elevated pCO2 suggesting chronic CO2  retention, chloride is 94, glucose is 127, albumin is 3.3.  Blood gas on  admission, pH 7.30, pCO2 of 72, pO2 of 67, and this morning on 2 L, pH  7.27, pCO2 of 74, pO2 of 80.  His chest x-ray shows emphysematous  changes, but no acute findings.   ASSESSMENT:  He has severe COPD.  He has what appears to be chronic  respiratory failure with an acute exacerbation.  He says he is feeling  better than he was earlier this morning, so I am going to plan to repeat  the blood  gas at about 11 o'clock and see if it has improved.  He is already on  steroids.  He is on antibiotics.  He is on inhaled bronchodilators.  If  he does not improve, we may need to have him take to try BiPAP.  I am  going to wait and see what his gases look like later today.      Edward L. Juanetta Gosling, M.D.  Electronically Signed     ELH/MEDQ  D:  02/09/2008  T:  02/09/2008  Job:  [id number]

## 2011-02-16 NOTE — Group Therapy Note (Signed)
NAME:  Jeff Ray, Jeff Ray                 ACCOUNT NO.:  [id number]   MEDICAL RECORD NO.:  [id number]          PATIENT TYPE:  INP   LOCATION:  IC08                          FACILITY:  [facility]   PHYSICIAN:  Angus G. Renard Matter, MD   DATE OF BIRTH:  [birth year]   DATE OF PROCEDURE:  DATE OF DISCHARGE:                                 PROGRESS NOTE   This patient was extubated yesterday and is more comfortable.  He does  have a history of chronic obstructive pulmonary disease, and left lower  lobe infiltrate, has been followed as well by Dr. Juanetta Gosling.   OBJECTIVE:  VITAL SIGNS:  Blood pressure 151/57, pulse rate 80, and  respiration 18.  LUNGS:  Diminished breath sounds.  Rhonchi bilaterally.  HEART:  Regular rhythm.  ABDOMEN:  No palpable organs or masses.   ASSESSMENT:  The patient has a history respiratory failure, chronic  obstructive pulmonary disease, bronchopneumonia, and diabetes.   PLAN:  To reinstitute Lantus insulin.  Continue sliding scale Humalog  insulin.  Continue current antibiotic and hand nebulizer treatments.  We  will remove probably and start getting the patient out of bed.      Angus G. Renard Matter, MD  Electronically Signed     AGM/MEDQ  D:  12/08/2008  T:  12/08/2008  Job:  [id number]

## 2011-02-16 NOTE — Group Therapy Note (Signed)
NAME:  Jeff Ray, Jeff Ray                 ACCOUNT NO.:  [id number]   MEDICAL RECORD NO.:  [id number]          PATIENT TYPE:  INP   LOCATION:  IC08                          FACILITY:  [facility]   PHYSICIAN:  Edward L. Juanetta Gosling, M.D.DATE OF BIRTH:  12-14-1955   DATE OF PROCEDURE:  DATE OF DISCHARGE:                                 PROGRESS NOTE   Jeff Ray remains intubated on the ventilator.  He has had no new  problems noted through the night.  He has been sedated.   His physical examination shows that he is sedated.  His heart rates in  the 80s, O2 sats 100% on the ventilator.  His chest is clearer than  yesterday.  He still has some rhonchi and wheezing.  His heart is  regular without gallop.  His abdomen is soft.   His laboratory work today; white blood count of 7900, hemoglobin is 12,  and platelets 202.  His BMET shows a BUN of 27; creatinine 1.42; glucose  is 205; magnesium is 3.2, which is actually a bit high; phosphorus is  1.4, which is a bit low; and BNP is less than 30.  His blood gas on 35%,  600, rate of 12, 5 of PEEP shows pH 7.48, pCO2 of 47, and pO2 of 87.  Respiratory culture thus far shows no organisms.  His chest x-ray is  essentially unchanged from yesterday.   ASSESSMENT:  He has chronic obstructive pulmonary disease exacerbation,  which has resulted in respiratory failure, intubation, and mechanical  ventilation.  He has pneumonia, which is being treated.  He has  diabetes, which is a fairly new diagnosis.  He is mildly  hypophosphatemic.   PLAN:  Replace his phosphorus.  Continue his other treatments.  We will  see if he can wean today.  I expect now it is probably going to be too  early, but we will see what he can do, and continue with all of his  other treatments.      Edward L. Juanetta Gosling, M.D.  Electronically Signed     ELH/MEDQ  D:  12/03/2008  T:  12/03/2008  Job:  [id number]

## 2011-02-16 NOTE — Group Therapy Note (Signed)
NAME:  Jeff Ray, Jeff Ray                 ACCOUNT NO.:  [id number]   MEDICAL RECORD NO.:  [id number]          PATIENT TYPE:  INP   LOCATION:  A322                          FACILITY:  [facility]   PHYSICIAN:  Edward L. Juanetta Gosling, M.D.DATE OF BIRTH:  07/07/1956   DATE OF PROCEDURE:  DATE OF DISCHARGE:  12/11/2008                                 PROGRESS NOTE   Patient of Dr. Renard Matter.  Mr. Jeff Ray says he is hopeful of being  discharged today.  He feels well and has no new complaints.  Everything  else is doing well.  He says he is somewhat short of breath, but no  worse than he usually is at home.   His vital signs are as recorded.  His chest is clear.  He is a little  bit hoarse.  He is active.  I will follow him in my office along with  Dr. Renard Matter and of course I will be glad do that.  He is going to be on  nebulizer treatments at home and he is going to be on BiPAP.   My assessment then is that he is much improved.   Plan is for him to be discharged home today.      Edward L. Juanetta Gosling, M.D.  Electronically Signed     ELH/MEDQ  D:  12/11/2008  T:  12/12/2008  Job:  [id number]

## 2011-02-16 NOTE — Group Therapy Note (Signed)
NAME:  Jeff Ray, Jeff Ray               ACCOUNT NO.:  [id number]   MEDICAL RECORD NO.:  [id number]         PATIENT TYPE:  PINP   LOCATION:  IC08                          FACILITY:  [facility]   PHYSICIAN:  Angus G. Renard Matter, MD   DATE OF BIRTH:  01-26-1956   DATE OF PROCEDURE:  DATE OF DISCHARGE:                                 PROGRESS NOTE   This patient remains on respirator attempts at weaning him yesterday  were unsuccessful, but attempts will be made again today to wean him  from respirator.  He does have chronic obstructive pulmonary disease and  left lower lobe infiltrate.   OBJECTIVE:  VITAL SIGNS:  Blood pressure 125/85, pulse 105.  LUNGS:  Diminished breath sounds.  Rhonchi bilaterally.  HEART:  Regular rhythm.  ABDOMEN:  No palpable organs or masses.   ASSESSMENT:  The patient remains on ventilator with respiratory failure.  Does have bronchopneumonia, chronic obstructive pulmonary disease, and  diabetes.   PLAN:  To again attempt to get him extubated today hopefully  successfully.      Angus G. Renard Matter, MD  Electronically Signed     AGM/MEDQ  D:  12/07/2008  T:  12/07/2008  Job:  [id number]

## 2011-02-16 NOTE — Group Therapy Note (Signed)
NAME:  Jeff Ray, Jeff Ray                 ACCOUNT NO.:  [id number]   MEDICAL RECORD NO.:  [id number]          PATIENT TYPE:  INP   LOCATION:  IC08                          FACILITY:  [facility]   PHYSICIAN:  Angus G. Renard Matter, MD   DATE OF BIRTH:  24-Oct-1955   DATE OF PROCEDURE:  DATE OF DISCHARGE:                                 PROGRESS NOTE   This patient was admitted with difficulty breathing, new-onset  bronchopneumonia, and chronic obstructive pulmonary disease.  His blood  gas is progressively deteriorated yesterday, he had to be put on the  ventilator, and is being managed by Dr. Juanetta Gosling.  The patient is resting  fairly comfortably today.   OBJECTIVE:  VITAL SIGNS:  Blood pressure 102/87, pulse 98, respiration  11, and O2 sats 99%.  LUNGS:  Diminished breath sounds.  HEART:  Regular rate and rhythm.  ABDOMEN:  No palpable organs or masses.  EXTREMITIES:  No edema.   Most recent blood gases; pH 7.484 with a pCO2 of 47.3, pO2 87.6, and  bicarb 35.2.  CBC; WBC 7900 with hemoglobin 12 and hematocrit 35.  X-ray  did show diffuse infiltrates, probable pneumonia, and COPD.   ASSESSMENT:  This patient does have chronic obstructive pulmonary  disease, bronchopneumonia.  He is resting fairly comfortably on  respirator.   PLAN:  To repeat chest x-ray today to further evaluate possible  interstitial edema and pneumonia.  Continue IV antibiotics and other  meds.      Angus G. Renard Matter, MD  Electronically Signed     AGM/MEDQ  D:  12/03/2008  T:  12/03/2008  Job:  [id number]

## 2011-02-16 NOTE — Group Therapy Note (Signed)
NAME:  Jeff Ray, Jeff Ray                 ACCOUNT NO.:  [id number]   MEDICAL RECORD NO.:  [id number]          PATIENT TYPE:  INP   LOCATION:  IC08                          FACILITY:  [facility]   PHYSICIAN:  Edward L. Juanetta Gosling, M.D.DATE OF BIRTH:  [birth year]   DATE OF PROCEDURE:  DATE OF DISCHARGE:                                 PROGRESS NOTE   Patient of Dr. Renard Matter.  Jeff Ray has now been off the ventilator for  almost 48 hours.  He says that he feels okay.  He has been able to get  up out of bed.  He has been moving around.  He still coughing some, but  generally better.   His physical examination today shows that his pulse is in the 91-92  range, blood pressure 141/82, respirations about 25, O2 sats 94% on  nasal cannula.  He looks very comfortable.  His chest is much clearer,  although he still has some rhonchi.  His heart is regular.  His abdomen  is soft.  Extremities showed no edema.  Central nervous system  examination is grossly intact.  He did have an episode of shortness of  breath yesterday that resolved.   This morning he has a pH of 7.40, pCO2 of 55, pO2 of 51 on 3 L nasal  cannula.  Chest x-ray shows what looks like some atelectasis.   My assessment then is that he has chronic obstructive pulmonary disease.  He has had respiratory failure.  He has diabetes and we will continue  his other treatments and follow.  I think he can probably move from the  ICU today and we will plan to give him some Mucomyst as well as his  other treatments.      Edward L. Juanetta Gosling, M.D.  Electronically Signed     ELH/MEDQ  D:  12/09/2008  T:  12/09/2008  Job:  [id number]

## 2011-02-16 NOTE — Group Therapy Note (Signed)
NAME:  Jeff Ray, Jeff Ray                 ACCOUNT NO.:  [id number]   MEDICAL RECORD NO.:  [id number]         PATIENT TYPE:  INP   LOCATION:  A336                          FACILITY:  [facility]   PHYSICIAN:  Angus G. Renard Matter, MD   DATE OF BIRTH:  1956-09-07   DATE OF PROCEDURE:  DATE OF DISCHARGE:                                 PROGRESS NOTE   This patient seems to be feeling better.  He was admitted with acute  pneumonia, COPD, and asthma, remains on BiPAP.  X-ray showed evidence of  pneumonia in the left lung base.   OBJECTIVE:  VITAL SIGNS:  Blood pressure 120/66, respiration 24, pulse  103, temp 97.5, O2 sats ranging from 95-99.  LUNGS:  Diminished breath sounds bilaterally.  Some slight dyspnea.  HEART:  Regular rhythm.  ABDOMEN:  No palpable organs or masses.   Most recent blood gases showed pH of 7.356 with a pCO2 of 68.6 and pO2  71.9.   ASSESSMENT:  The patient was admitted with above-stated problems.  He  seems to be improving some.  He does have elevated blood sugar being  covered with sliding scale, questionable new onset diabetes.   Plan to continue to monitor.      Angus G. Renard Matter, MD  Electronically Signed     AGM/MEDQ  D:  10/30/2008  T:  10/30/2008  Job:  [id number]

## 2011-02-16 NOTE — Group Therapy Note (Signed)
NAME:  Jeff Ray, Jeff Ray                 ACCOUNT NO.:  [id number]   MEDICAL RECORD NO.:  [id number]          PATIENT TYPE:  INP   LOCATION:  A322                          FACILITY:  [facility]   PHYSICIAN:  Edward L. Juanetta Gosling, M.D.DATE OF BIRTH:  [birth year]   DATE OF PROCEDURE:  12/10/2008  DATE OF DISCHARGE:                                 PROGRESS NOTE   Jeff Ray has severe COPD and diabetes.  He has been intubated and on  the ventilator.  He is now off the ventilator.  He has been off for  about 48 hours, seems to be much improved.  He had some congestion last  night thus he feels better this morning.   PHYSICAL EXAMINATION:  VITAL SIGNS:  His temperature is 97, pulse is 83,  respirations 20, blood pressure 133/81, and O2 sats 93% on 3 L.  CHEST:  Clearer than yesterday, still with some decreased breath sounds  and prolonged expiration, but otherwise much improved.   My plan then is to continue his treatments medications.  No changes  today.  He says that Dr. Renard Matter had told him that he could be possibly  discharged in the next 48 hours or so and I think that inappropriate  time span for [place] to be looking at it.  I do not think we should change  anything now.  He does seem to be steadily improving.      Edward L. Juanetta Gosling, M.D.  Electronically Signed     ELH/MEDQ  D:  12/10/2008  T:  12/10/2008  Job:  [id number]

## 2011-02-16 NOTE — Group Therapy Note (Signed)
NAME:  Jeff Ray, [name]                 ACCOUNT NO.:  [id number]   MEDICAL RECORD NO.:  [id number]           PATIENT TYPE:  INP   LOCATION:  IC08                          FACILITY:  [facility]   PHYSICIAN:  Angus G. Renard Matter, MD   DATE OF BIRTH:  1956-06-23   DATE OF PROCEDURE:  DATE OF DISCHARGE:                                 PROGRESS NOTE   This patient is off the respirator now and seems to be doing better.  He  does have a history of pneumonia, chronic obstructive pulmonary disease,  and diabetes.  A recent chest x-ray showed new right medial lung  disease, could not reflect atelectasis or infection, otherwise stable  chest.   OBJECTIVE:  VITAL SIGNS:  Blood pressure 133/84, pulse rate 82.  LUNGS:  Diminished breath sounds bilaterally.  HEART:  Regular rhythm.  ABDOMEN:  No palpable organs or masses.   ASSESSMENT:  The patient currently is off ventilator.  He has been  treated for respiratory failure.  Does have a history of recent  bronchopneumonia, chronic obstructive pulmonary disease, and diabetes.  A chest x-ray repeat shows new right medial lung base airspace opacity.   PLAN:  Continue current regimen.  Most recent set of blood gases showed  a pH of 7.402 with a PCO2 55.4, PO2 71.8.  Blood sugars have run from  142-313.      Angus G. Renard Matter, MD  Electronically Signed     AGM/MEDQ  D:  12/09/2008  T:  12/09/2008  Job:  [id number]

## 2011-02-16 NOTE — Group Therapy Note (Signed)
NAME:  Jeff Ray, Jeff Ray                 ACCOUNT NO.:  [id number]   MEDICAL RECORD NO.:  [id number]          PATIENT TYPE:  INP   LOCATION:  IC08                          FACILITY:  [facility]   PHYSICIAN:  Angus G. Renard Matter, MD   DATE OF BIRTH:  Feb 03, 1956   DATE OF PROCEDURE:  DATE OF DISCHARGE:                                 PROGRESS NOTE    This patient was admitted with difficulty breathing, new onset  bronchopneumonia, chronic obstructive pulmonary disease.  He remains on  respirator.   PHYSICAL EXAMINATION:  VITAL SIGNS:  Blood pressure 138/75, pulse 120-  98.  LUNGS:  Rhonchi bilaterally.  HEART:  Sinus tachycardia.  ABDOMEN:  No palpable organs or masses.  EXTREMITIES:  Free of edema.   Laboratory work:  Blood gases, pH of 7.432 with a pCO2 of 56.5 and pO2  76.2.  CBC:  WBC 10,200, hemoglobin 12.3, hematocrit 35.9.  Sugars range  from 174 to 237.  Chemistries:  Sodium 138, potassium 4.3, chloride 96,  glucose 230, BUN 130, creatinine 1.21.   ASSESSMENT:  The patient does have chronic obstructive pulmonary  disease, bronchopneumonia, has new-onset diabetes.   PLAN:  To continue ventilatory support.  The patient will also be seen  by Dr. Juanetta Gosling, Pulmonology.      Angus G. Renard Matter, MD  Electronically Signed     AGM/MEDQ  D:  12/04/2008  T:  12/04/2008  Job:  [id number]

## 2011-02-19 NOTE — Group Therapy Note (Signed)
NAME:  Jeff Ray, Jeff Ray                           ACCOUNT NO.:  [id number]   MEDICAL RECORD NO.:  [id number]                   PATIENT TYPE:  INP   LOCATION:  A201                                 FACILITY:  [facility]   PHYSICIAN:  Edward L. Juanetta Gosling, M.D.             DATE OF BIRTH:  21-Aug-1956   DATE OF PROCEDURE:  12/19/2003  DATE OF DISCHARGE:                                   PROGRESS NOTE   PROBLEM:  Chronic obstructive pulmonary disease.   SUBJECTIVE:  Mr. Rocks continues to be short of breath and he actually  desaturated into the 80s which I was visiting him, this from simply fixing  his breakfast.  His O2 saturation came back up fairly quickly, but it did  desaturate.  He says he still becomes short of breath with very minimal  exertion.  Otherwise, he says that he is doing about the same.  He is  wondering about going back to work and clearly he is not going to be able to  return to his previous job at this point.  He may be able to later, but not  now.   OBJECTIVE:  His exam today shows his temperature is 97, pulse 86,  respirations 20, blood pressure 140/79.  O2 saturation listed at 100 on 1 L  but, as mentioned, he did desaturate.  His chest is clear, his heart is  regular.  He has decreased breath sounds.   ASSESSMENT:  He is better.  He has severe chronic obstructive pulmonary  disease.  I think he is probably going to need oxygen and a nebulizer at  home.  I think he is going to be on temporary, at least, disability and  maybe permanently disabled, and I think that he needs to have a pulmonary  function test as an outpatient once he has improved some so we can get a  better idea exactly where he is as far as his lung function is concerned.      ___________________________________________                                            Oneal Deputy. Juanetta Gosling, M.D.   ELH/MEDQ  D:  12/19/2003  T:  12/19/2003  Job:  [id number]

## 2011-02-19 NOTE — Discharge Summary (Signed)
NAME:  Jeff Ray, Jeff Ray                 ACCOUNT NO.:  [id number]   MEDICAL RECORD NO.:  [id number]          PATIENT TYPE:  INP   LOCATION:  A318                          FACILITY:  [facility]   PHYSICIAN:  Angus G. Renard Matter, MD   DATE OF BIRTH:  1956/09/08   DATE OF ADMISSION:  11/18/2006  DATE OF DISCHARGE:  02/18/2008LH                               DISCHARGE SUMMARY   DIAGNOSIS:  Chronic obstructive pulmonary disease exacerbation.   CONDITION:  Stable and improved at the time of his discharge.  This  patient has had a long-term disability secondary to his lungs, shortness  of breath, cough and fever enough to come to the emergency room.  He had  been using Mucinex.  He is on nasal O2 at 2 liters.  No chest pain or  abdominal pain.   PHYSICAL EXAMINATION:  GENERAL APPEARANCE:  A 55 year old African  American male tachypneic on nasal O2.  VITAL SIGNS:  Blood pressure 139/65, respirations 24, pulse 109,  temperature 98.  HEENT:  Eyes:  PERRLA.  TMs negative.  Oropharynx benign.  NECK:  Supple, no JVD or thyroid abnormalities.  LUNGS:  Diminished breath sounds.  Rhonchi bilaterally.  CARDIOVASCULAR:  Regular rhythm with no murmurs.  ABDOMEN:  No palpable organs or masses, no hepatosplenomegaly.  EXTREMITIES:  Free of edema.   LABORATORY DATA:  Admission CBC with wbc 10,700 with hemoglobin 15.9,  hematocrit 46.1.  Blood gases with pH 7.350 with pCO2 35, pO2 80.  Chemistries with sodium 134, potassium 3.9, chloride 97, CO2 29, glucose  116, BUN 8, creatinine 0.97.  Liver enzymes with SGOT 16, SGPT 13,  alkaline phosphatase 84, bilirubin 0.8.  BNP less than 30.  Blood  cultures no growth in five days.   X-rays on admission showed bronchitis without evidence of pneumonia.   HOSPITAL COURSE:  The patient, at the time of his admission, he was  continued on 2 liters of oxygen per minute, was given albuterol/Atrovent  nebulizer treatments every four hours while awake, Solu-Medrol 125 mg  every  six hours.  He was placed on IV normal saline at 50 mL an hour,  Rocephin 1 g IV q.24h., Zithromax 500 mg every 24 hours.  Patient was  placed on nebulizer treatments as well.  Patient slowly and  progressively improved.  He was able to get out of bed by November 19, 2006.  Solu-Medrol was decreased to every eight hours and after three  days' hospitalization, he was able to return home.   DISCHARGE MEDICATIONS:  1. DuoNeb treatments every six hours.  2. Brovana 15 mcg b.i.d.  3. Avelox 400 mg daily x5 days.  4. Prednisone taper.  5. Albuterol nebulizer treatment p.r.n.   Patient was stable and improved at the time of discharge.      Angus G. Renard Matter, MD  Electronically Signed     AGM/MEDQ  D:  12/15/2006  T:  12/15/2006  Job:  [id number]

## 2011-02-19 NOTE — Group Therapy Note (Signed)
NAME:  Jeff Ray, Jeff Ray                 ACCOUNT NO.:  [id number]   MEDICAL RECORD NO.:  [id number]           PATIENT TYPE:  INP   LOCATION:  A332                          FACILITY:  [facility]   PHYSICIAN:  Angus G. Renard Matter, MD   DATE OF BIRTH:  1956/01/29   DATE OF PROCEDURE:  DATE OF DISCHARGE:                                   PROGRESS NOTE   This patient was admitted with an exacerbation of COPD.  Chest x-ray showed  hyperinflated lungs, no active pulmonary disease.   OBJECTIVE:  VITAL SIGNS:  Blood pressure 127/78, respirations 20, pulse 100,  temperature 97.8, O2 saturation 97%.  LUNGS:  Rhonchi and scattered wheezes over all lung fields.  ABDOMEN:  Without masses.   ASSESSMENT:  Patient admitted with exacerbation of chronic obstructive  pulmonary disease.   PLAN:  Continue current regimen.      Angus G. Renard Matter, MD  Electronically Signed     AGM/MEDQ  D:  06/21/2005  T:  06/21/2005  Job:  [id number]

## 2011-02-19 NOTE — Discharge Summary (Signed)
NAME:  Jeff Ray, Jeff Ray                 ACCOUNT NO.:  [id number]   MEDICAL RECORD NO.:  [id number]           PATIENT TYPE:  INP   LOCATION:  A331                          FACILITY:  [facility]   PHYSICIAN:  Angus G. Renard Matter, MD   DATE OF BIRTH:  1956-07-23   DATE OF ADMISSION:  02/08/2008  DATE OF DISCHARGE:  05/11/2009LH                               DISCHARGE SUMMARY   HISTORY:  This is a 55 year old African American male who was admitted  on Feb 08, 2008, discharged on Feb 12, 2008, 4 days' hospitalization.   DIAGNOSIS:  Chronic obstructive pulmonary disease, bronchitis,  respiratory failure.   CONDITION ON DISCHARGE:  Stable and improved at time of his discharge.   A 55 year old African American male was seen in office prior to  admission with extreme shortness of breath.  The patient has a history  of asthmatic bronchitis and COPD over a period of approximately 4 years,  has been oxygen dependent at home.  He had been on maximal medications.  For the past 3-4 days, he had developed increasing dyspnea, increased  respiratory distress.  He was seen in the office prior to admission, has  been placed on maximal amount of medications as an outpatient without  relief.  He is oxygen dependent.  He had been using Brovana and  Pulmicort, as well as neb treatments without significant improvement,  although oxygen saturation was 96%.  He was admitted for further  evaluation.   PHYSICAL EXAMINATION:  Uncomfortable male with blood pressure 130/80,  pulse 60, respiration 18, temp 98.  HEENT:  Eyes, PERRLA.  TMs negative.  Oropharynx benign.  NECK:  Supple.  No JVD or thyroid abnormalities.  HEART:  Regular rhythm no murmurs.  LUNGS:  Diminished breath sounds, rhonchi, and scattered wheezes  bilaterally with prolonged expiratory phase of respiration.  ABDOMEN:  No palpable organs or masses.  No organomegaly.  NEUROLOGIC:  No focal deficit.   LABORATORY DATA:  Blood gases on admission pH of 7.309  with a pCO2 of  72.9 and pO2 of 67.9.  Subsequent blood gases on Feb 12, 2008, pH of  7.407 with a pCO2 of 53.0, pO2 68.2.  CBC:  WBC 6,100 with hemoglobin  15.2, hematocrit 43.8.  Chemistries on admission, sodium 135, potassium  4.2, chloride 94, CO2 35, glucose 127, BUN 11, creatinine 0.999.  Subsequent chemistries on Feb 10, 2008, sodium 137, potassium 4.2,  chloride 97, CO2 35, glucose 265, BUN 13, creatinine 1.06.  Urinalysis  negative.   X-rays:  Chest x-ray on admission, emphysematous changes without acute  findings.   Electrocardiogram,  RBBB, abnormal EKG.   HOSPITAL COURSE:  The patient at the time of his admission was placed on  nasal O2 at 3 liters per minute, IV half-normal saline 100 mL an hour.  Vital signs q.i.d.  He was placed on IV Rocephin 1 gram daily, Zithromax  500 daily, IV Solu-Medrol 250 mg every 6 hours for 4 doses then 125 mg  q.6 h., and treatments with albuterol/Atrovent every 4 hours while  awake.  He was continued on  Tylenol 500 mg q.4 h. p.r.n. pain, Mucinex  1200 mg b.i.d., subcutaneous Lovenox 40 mg daily.  The patient  progressively improved during his hospital stay.  His blood gases  improved.  O2 sats vary between 90% and 100% on 2-1/2 liters.  A chest x-  ray showed evidence of emphysematous changes without acute findings.  On  talking, he stated he was in severe COPD with chronic respiratory  failure with acute exacerbation.  Continue to monitor his blood gases.  The patient showed progressive improvement throughout his hospital stay  and was able to be discharged on the fourth hospital day.   DISCHARGE MEDICATIONS:  The patient was discharge on the following  medications:  1. Singulair 10 mg daily.  2. Zyrtec 5 mg daily.  3. Mucinex over-the-counter 600 mg daily.  4. Medrol Dosepak 4 mg for 6 days.  5. Levaquin 500 mg daily.  6. Nasal O2 at 3 liters per minute.  7. Symbicort 2 puffs b.i.d.  8. Xopenex 2 puffs every 4 hours p.r.n.       Angus G. Renard Matter, MD  Electronically Signed     AGM/MEDQ  D:  02/27/2008  T:  02/27/2008  Job:  [id number]

## 2011-02-19 NOTE — Group Therapy Note (Signed)
NAME:  Jeff Ray, Jeff Ray                           ACCOUNT NO.:  [id number]   MEDICAL RECORD NO.:  [id number]                   PATIENT TYPE:  INP   LOCATION:  A201                                 FACILITY:  [facility]   PHYSICIAN:  Angus G. Renard Matter, M.D.              DATE OF BIRTH:  May 16, 1956   DATE OF PROCEDURE:  DATE OF DISCHARGE:                                   PROGRESS NOTE   SUBJECTIVE:  This is a patient admitted to the hospital with exacerbation of  chronic obstructive pulmonary disease, bronchitis and hypoxia.  The patient  remains afebrile.   OBJECTIVE:  The O2 saturations 99-100.  Vital signs, blood pressure 129/67,  respirations 22, pulse 85, temperature 97.1.  Lungs, diminished breath  sounds.  Heart, regular rhythm.  Abdomen, no palpable organs or masses.   ASSESSMENT:  The patient is admitted with exacerbation of bronchitis,  chronic obstructive pulmonary disease, hypoxia and enteritis.   PLAN:  Continue current regimen.      ___________________________________________                                            Ishmael Holter. Renard Matter, M.D.   AGM/MEDQ  D:  12/15/2003  T:  12/15/2003  Job:  [id number]

## 2011-02-19 NOTE — Discharge Summary (Signed)
NAME:  Jeff Ray, Jeff Ray                 ACCOUNT NO.:  [id number]   MEDICAL RECORD NO.:  [id number]          PATIENT TYPE:  INP   LOCATION:  A322                          FACILITY:  [facility]   PHYSICIAN:  Angus G. Renard Matter, MD   DATE OF BIRTH:  09/19/1956   DATE OF ADMISSION:  11/30/2008  DATE OF DISCHARGE:  03/10/2010LH                               DISCHARGE SUMMARY   DIAGNOSES:  Bronchopneumonia with acute on chronic respiratory failure,  chronic obstructive pulmonary disease, and diabetes mellitus type 2.   Condition stable and improved at the time of his discharge.   A 55 year old male presented to the ED with chief complaint being  shortness of breath.  He does have a history of having been treated here  previously for similar problems; bronchopneumonia, chronic obstructive  pulmonary disease, respiratory failure, asthma and new-onset diabetes.  He did have a chest x-ray on admission, which showed left lower lobe  infiltrate with possibility of underlying malignancy.  The patient was  treated in the emergency room and was subsequently admitted.   PHYSICAL EXAMINATION:  VITAL SIGNS:  Blood pressure 117/74, respiration  18, pulse 118, and temperature 98.  HEENT:  Eyes, PERRLA.  TMs negative.  Oropharynx benign.  NECK:  Supple.  No JVD or thyroid abnormalities.  LUNGS:  Rhonchi and scattered wheezes throughout both lung fields.  ABDOMEN:  No palpable organs or masses.  No organomegaly.  EXTREMITIES:  Free of edema.   LABORATORY DATA:  Admission CBC; WBC 4900 with hemoglobin of 13.9 and  hematocrit 41.4.  Subsequent CBC on December 06, 2008, WBC 15,900 with  hemoglobin of 14.0 with hematocrit of 41.0.  Admission CBC showed  neutrophils 92%, 4% lymphocytes.  Chemistries on admission; sodium 130,  potassium 4, chloride 89, CO2 30, glucose 174, BUN 7, and creatinine is  0.93.  Subsequent chemistries on December 06, 2008; sodium 141, potassium  4.6, chloride 99, CO2 37, glucose 285, BUN 26, and  creatinine 0.91.  GFR  on December 03, 2008, 52.  On December 05, 2008, greater than 60.  Liver  enzymes; SGOT 11, SGPT 16, alkaline phosphatase 67, bilirubin 0.4.  Blood cultures, no growth.  Respiratory culture, normal oropharyngeal  flora.  Blood gases on admission; pH was 7.330 with a pCO2 of 59.1, pO2  91.  Subsequent blood gases on December 01, 2008; pH 7.243 with a pCO2  of 81.8, pO2 of 94.5 and on December 03, 2008, pH was 7.484 with a pCO2 of  47 and pO2 87.6.   X-RAYS:  Chest x-ray on admission, left lower infiltrate.  Subsequent  chest x-ray showed no evidence of pulmonary consolidation or effusion,  but diffuse interstitial infiltrates superimposed on changes of COPD.  Subsequent x-ray showed emphysema unchanged, bibasilar air space  disease.  Subsequent x-ray showed some improvement in the left basilar  aeration.   HOSPITAL COURSE:  The patient on admission was placed on 2 L of O2  DuoNeb treatments every 4 hours, Solu-Medrol 80 mg every 8 hours, IV  Rocephin 1 g q.24 h., Accu-Cheks a.c. and nightly, moderate sliding  scale NovoLog coverage, 1800-calories ADA diet, and Xanax 0.5 mg q.4 h.  p.r.n.  Continue on Singulair 10 mg daily.  Zithromax was added to  regimen 500 mg daily.  The patient was initially placed on BiPAP 2/6  that would be transferred to ICU on December 02, 2008, was seen in  consultation by Dr. Juanetta Gosling who had him intubated.  Foley catheter was  inserted.  Blood gases were monitored.  DVT prophylaxis was begun with  Lovenox 40 mg subcutaneously daily.  Blood gases were monitored and  showed evidence of hypoxia and hypercapnia.  Weaning protocol was begun  on December 03, 2008.  The patient did not tolerate being weaned, but he  eventually was extubated on December 07, 2008, placed on clear liquid diet.  Accu-Cheks a.c. and nightly were monitored.  He was placed on Lantus  insulin 10 units daily.  The patient was subsequently placed on 1800-  calorie ADA diet, showed progressive  improvement throughout the  remainder of his hospitalization.  X-rays did return.  Solu-Medrol was  administered 125 mg every 6 hours.  While on Solu-Medrol, his sugars did  peak up some, but eventually came down but the dosage was reduced.  The  patient showed progressive improvement throughout the remainder of his  hospital stay.  He was able to be discharged after 11 days of  hospitalization.  He was discharged home on ibuprofen 800 mg as needed,  Allegra 180 mg daily, alprazolam 0.5 mg every 4 hours p.r.n., Singulair  10 mg daily, Vicodin 7.5/750 every 4 hours p.r.n., metformin 500 mg  b.i.d., prednisone taper, Levaquin 500 mg daily, Protonix 40 mg daily,  Lantus insulin 20 units daily, and nasal O2 at 2 L per minute daily.  The patient was asked to return to physician's office for followup      Angus G. Renard Matter, MD  Electronically Signed     AGM/MEDQ  D:  12/23/2008  T:  12/23/2008  Job:  [id number]

## 2011-02-19 NOTE — Group Therapy Note (Signed)
NAME:  EVERADO, PILLSBURY                           ACCOUNT NO.:  [id number]   MEDICAL RECORD NO.:  [id number]                   PATIENT TYPE:  INP   LOCATION:  A201                                 FACILITY:  [facility]   PHYSICIAN:  Angus G. Renard Matter, M.D.              DATE OF BIRTH:  05/24/1956   DATE OF PROCEDURE:  12/18/2003  DATE OF DISCHARGE:                                   PROGRESS NOTE   SUBJECTIVE:  This patient was admitted to the hospital with exacerbation of  chronic obstructive pulmonary disease, bronchitis, and hypoxia.  He had his  oxygen titrated yesterday and his O2 saturations still have remained fairly  good.   OBJECTIVE:  Vital signs:  Blood pressure 131/83, respirations 20, pulse 99,  temperature 97.3.  Lungs:  Diminished breath sounds.  Heart:  Regular  rhythm.  Abdomen:  No palpable organs or masses.   ASSESSMENT:  The patient was admitted to the hospital with chronic  bronchitis, chronic obstructive pulmonary disease, hypoxia, enteritis.   PLAN:  Plan to continue current regimen.  Will continue to attempt to  titrate down oxygen.  Continue current regimen.      ___________________________________________                                            Ishmael Holter. Renard Matter, M.D.   AGM/MEDQ  D:  12/18/2003  T:  12/18/2003  Job:  [id number]

## 2011-02-19 NOTE — Group Therapy Note (Signed)
NAME:  Jeff Ray, Jeff Ray                           ACCOUNT NO.:  [id number]   MEDICAL RECORD NO.:  [id number]                   PATIENT TYPE:  INP   LOCATION:  A201                                 FACILITY:  [facility]   PHYSICIAN:  Angus G. Renard Matter, M.D.              DATE OF BIRTH:  27-Nov-1955   DATE OF PROCEDURE:  12/16/2003  DATE OF DISCHARGE:                                   PROGRESS NOTE   SUBJECTIVE:  This patient was admitted to the hospital with exacerbation of  chronic obstructive pulmonary disease, bronchitis, and hypoxia.  The patient  still has a cough and shortness of breath.   OBJECTIVE:  Vital signs:  Blood pressure 129/69, respirations 20, pulse 99,  temperature 98.1, O2 saturations remain at 100.  Lungs:  Diminished breath  sounds.  Heart:  Regular rhythm.  Abdomen:  No palpable organs or masses.   ASSESSMENT:  The patient was admitted with exacerbation of chronic  bronchitis, chronic obstructive pulmonary disease, hypoxia, enteritis.   PLAN:  Continue current regimen.  Will obtain pulmonary consult.      ___________________________________________                                            Ishmael Holter. Renard Matter, M.D.   AGM/MEDQ  D:  12/16/2003  T:  12/16/2003  Job:  [id number]

## 2011-02-19 NOTE — Group Therapy Note (Signed)
NAME:  Jeff Ray, Jeff Ray                             ACCOUNT NO.:  [id number]   MEDICAL RECORD NO.:  [id number]                  PATIENT TYPE:   LOCATION:                                       FACILITY:   PHYSICIAN:  Edward L. Juanetta Gosling, M.D.             DATE OF BIRTH:   DATE OF PROCEDURE:  12/17/2003  DATE OF DISCHARGE:                                   PROGRESS NOTE   PROBLEM:  Chronic obstructive pulmonary disease.   SUBJECTIVE:  Jeff Ray says he is feeling a little bit better.  He has less  congestion.  He has no cough, but he is still having some problems with  oxygenation.  He is not coughing up anything up.   His physical examination shows that his temperature 97.9, pulse 99,  respirations 20, blood pressure 144/72.  O2 saturation is 100% on four  liters.  His chest shows fairly marked bilateral rhonchi.  His heart is  regular.  His abdomen is soft.   ASSESSMENT:  He seems to be doing better.   PLAN:  I am going to go ahead with plans for a nicotine patch.  Will try to  reduce his O2.  Continue with all other treatments and follow.      ___________________________________________                                            Oneal Deputy Juanetta Gosling, M.D.   ELH/MEDQ  D:  12/17/2003  T:  12/18/2003  Job:  [id number]

## 2011-02-19 NOTE — H&P (Signed)
NAME:  Jeff Ray, Jeff Ray                 ACCOUNT NO.:  [id number]   MEDICAL RECORD NO.:  [id number]          PATIENT TYPE:  INP   LOCATION:  A318                          FACILITY:  [facility]   PHYSICIAN:  Mila Homer. Sudie Bailey, M.D.DATE OF BIRTH:  1955-12-27   DATE OF ADMISSION:  11/18/2006  DATE OF DISCHARGE:  LH                              HISTORY & PHYSICAL   HISTORY OF PRESENT ILLNESS:  This is a patient of Dr. Butch Penny who  is disabled due to his lungs.  He developed shortness of breath, cough  and some fever starting 2 days prior to admission; it became bad enough  so he came to the emergency room this evening.   He currently uses Mucinex and occasionally he will use prednisone  through Dr. Renard Matter, but has not been using it this time.  He is on O2  at 2 L by nasal prongs.   He denies any problems like chest pain, abdominal pain or GU  symptomatology.  He has been admitted 2 times for COPD exacerbation; his  last admission for this was September 2006 for a 3-day course.   He continues to smoke between 1/2 and 1 pack of cigarettes a day.   PHYSICAL EXAMINATION:  GENERAL:  Admission exam showed a pleasant 41ish-  year-old man who was tachypneic.  He is on O2, but he is working to  breathe.  He appeared to be a good historian.  He is well-developed,  well-nourished.  LUNGS:  Decreased breath sounds throughout and some expiratory wheezes,  however.  There are some rhonchi as well.  HEENT:  Pharynx is normal.  His TMs were gray.  NECK:  There were negative anterior cervical nodes.  HEART:  Regular rhythm with a rate of 100.  ABDOMEN:  Soft without hepatosplenomegaly or mass, or tenderness.  EXTREMITIES:  There was no edema of the ankles.   RADIOLOGIC FINDINGS:  Chest x-ray showed COPD.   ADMISSION DIAGNOSES:  1. Chronic obstructive pulmonary disease exacerbation.  2. Tobacco use disorder.  3. Disability secondary to chronic obstructive pulmonary disease.   PLAN:  Plan will be  to admit him to the hospital on O2 at 2 L.  Blood  gases are pending.  He will be on albuterol, ipratropium and nebulizer  treatments q.4 h. while awake with Solu-Medrol 125 mg IV q.6 h., IV of  normal saline at 50 mL an hour, Rocephin 1 g IV q.24 h. and Zithromax  500 mg IV q.24 h. as well.  blood work including a CBC, MET-7 and Chem-  12 are pending, as are the blood gases, as mentioned.      Mila Homer. Sudie Bailey, M.D.  Electronically Signed     SDK/MEDQ  D:  11/18/2006  T:  11/19/2006  Job:  [id number]

## 2011-02-19 NOTE — Group Therapy Note (Signed)
NAME:  GREGORI, ABRIL                             ACCOUNT NO.:  [id number]   MEDICAL RECORD NO.:  [id number]                  PATIENT TYPE:   LOCATION:                                       FACILITY:   PHYSICIAN:  Angus G. Renard Matter, M.D.              DATE OF BIRTH:   DATE OF PROCEDURE:  DATE OF DISCHARGE:  12/20/2003                                   PROGRESS NOTE   SUBJECTIVE:  This patient was admitted with exacerbation of chronic,  obstructive pulmonary disease, chronic otitis and hypoxia.  His oxygen is  being titrated.  He still maintains adequate oxygen concentration.   OBJECTIVE:  Vital signs:  Blood pressure 129/61, respirations 20, pulse 96,  temperature 95.5.  Lungs, diminished breath sounds.  Heart: Regular rhythm.  Abdomen:  No palpable organs or masses.   ASSESSMENT:  The patient was admitted with chronic bronchitis, chronic  obstructive pulmonary disease, hypoxia.   PLAN:  Continue current regimen.      ___________________________________________                                            Ishmael Holter. Renard Matter, M.D.   AGM/MEDQ  D:  12/20/2003  T:  12/20/2003  Job:  [id number]

## 2011-02-19 NOTE — Group Therapy Note (Signed)
NAME:  Jeff Ray, Jeff Ray                           ACCOUNT NO.:  [id number]   MEDICAL RECORD NO.:  [id number]                   PATIENT TYPE:  INP   LOCATION:  A201                                 FACILITY:  [facility]   PHYSICIAN:  Edward L. Juanetta Gosling, M.D.             DATE OF BIRTH:  1956-08-30   DATE OF PROCEDURE:  12/18/2003  DATE OF DISCHARGE:                                   PROGRESS NOTE   PROBLEM:  Chronic obstructive pulmonary disease with exacerbation.   SUBJECTIVE:  Mr. Hosley says he feels like he is having more trouble  clearing his sputum today.  He has no other new complaints.  He denies any  nausea, vomiting, fever, or chills.   OBJECTIVE:  His exam now shows that his chest is somewhat clearer, his heart  is regular, he still has some rhonchi, his abdomen is soft.  Temperature is  97.3, pulse 99, respirations 20, blood pressure 131/83.  His O2 saturation  is in the high 90s on 1 L.   PLAN:  My plan would be to continue with his treatments.  Because he is  complaining about his sputum being as thick as it is and he is already on  guaifenesin, I am going to add Mucomyst to his nebulizer treatments and see  if it makes any difference.      ___________________________________________                                            Oneal Deputy. Juanetta Gosling, M.D.   ELH/MEDQ  D:  12/18/2003  T:  12/18/2003  Job:  [id number]

## 2011-02-19 NOTE — H&P (Signed)
NAME:  Jeff Ray, Jeff Ray NO.:  [id number]   MEDICAL RECORD NO.:  [id number]          PATIENT TYPE:  INP   LOCATION:  A332                          FACILITY:  [facility]   PHYSICIAN:  Catalina Pizza, M.D.        DATE OF BIRTH:  05-22-1956   DATE OF ADMISSION:  06/18/2005  DATE OF DISCHARGE:  LH                                HISTORY & PHYSICAL   PRIMARY CARE PHYSICIAN:  Butch Penny, M.D.   HISTORY OF PRESENT ILLNESS:  Jeff Ray is a 55 year old African-American  male who presented to the emergency department last evening due to worsening  shortness of breath and tightness in his chest. He states approximately 2  days ago he was working in the yard and started to have some allergy type  symptoms with eyes burning, worsening drainage from his nose and increased  cough. As the cough worsened his breathing became more and more short. He  denies any chest pains. States that he had been using his albuterol MDI  approximately three times in the last week but was previously on nebulizers  up to 4 times daily p.r.n., and also was supposed to be on Advair but  stopped approximately 2 weeks ago.   MEDICATIONS:  1.  He is supposed to be on Advair 250/50 b.i.d.  2.  Albuterol nebulizations q.i.d.  3.  Oxygen at 2 liters p.r.n.   PAST MEDICAL HISTORY:  Only significant for chronic obstructive pulmonary  disease. Unclear last time when he had his PFTs checked.   PAST SURGICAL HISTORY:  Disk fusion at the 5th and 6th vertebra in his neck  in 1992.   SOCIAL HISTORY:  He lives by himself. Has two sons age 29 and 5 with  prolonged court battles to gain complete custody. He is separated from his  first wife who has his 16 year old son. He smokes approximately one pack per  day for the last 32 years. Denies any alcohol. Remote history of crack  cocaine use x3, and marijuana use. He has worked multiple places including  [employer] and has had multiple exposures to things such as asbestos,  carbon  monoxide and significant dust. He has been on disability and previously had  full Medicaid but due to his disability apparently he does not qualify for  Medicaid.   FAMILY HISTORY:  Mother is alive at age 11, has diabetes and is a smoker  with lung issues. Father died in his 34's, had leukemia. Two brothers and  six sisters. No cancer, no coronary artery disease. No CVA. His 81 year old  son has no health problems.   PHYSICAL EXAMINATION:  VITAL SIGNS:  Temperature is 98.4, pulse is 102,  blood pressure is 130/68, respirations 24, saturations 96% on three liters.  GENERAL:  A thin African-American male in no acute distress but obviously  out of breath with any exertion (as he got up to go to the bathroom while I  was examining him).  HEENT:  Pupils are equal, round and reactive to light and accommodation. No  scleral icterus.  NECK:  Supple, no thyromegaly.  Some chronic pain to neck flexion.  CARDIOVASCULAR:  Regular rate and rhythm, no murmurs, rubs, or gallops.  LUNGS:  Clear to auscultation, air movement okay in all lung fields but  prolonged expiratory phase. No rhonchi or wheezing appreciated.  ABDOMEN:  Soft, nontender, nondistended with positive bowel sounds.  EXTREMITIES:  No lower extremity edema, 2+ pulses in all extremities. No  clubbing, cyanosis or edema.  NEUROLOGIC:  Cranial nerves II-XII intact, no neurologic deficits  appreciated.   LABORATORY DATA:  Basic metabolic panel: Sodium 136, potassium 3.8, chloride  103, CO2 28, glucose 129, BUN 8, creatinine 1.1, calcium 9.3. CBC: WBC of  7.1 thousand, hemoglobin 16.1, platelet count of 209,000. ABG on room air:  Showed a pH of 7.39, PCO2 of 44, PO2 of 53.2 upon admission, and on recheck  this morning was 7.3, PCO2 of 47, PO2 of 72.1.  Cardiac markers were negative x1.   Chest x-ray showed hyperinflated lungs with some flattening of the diaphragm  but no active cardiopulmonary disease.   ASSESSMENT/PLAN:  Mr.  Ray is a 55 year old African-American gentleman  with chronic obstructive pulmonary disease  exacerbation. Will continue on  intravenous Solu-Medrol and p.o. antibiotics to cover any possible low-grade  pneumonia causing irritation. COPD may have been exacerbated by an allergic-  type reaction but the patient has also been off of his Advair Diskus and  significant use of albuterol nebulizers for the last 2 weeks. It is unclear  exactly how he has been taking this and compliance due to cost is a big  concern.  Will continue with oxygen as needed to keep his saturations greater than  92%. May want to get pulmonary involved if the patient is still here on  Monday. Would likely benefit from having further pulmonary function tests  and maximizing his regimen on medications.   SOCIAL PROBLEMS:  I talked to him extensively about his concerns about his 51  and 79-year-old for which he is the sole caregiver; does not have much money  and does not have any significant care lined up for them. I told patient  that it would be at least another day or two before he could go home and to  try to arrange something for this. Will get care management and social work  to address some of his money issues and possible help with his medicine.  This is a big concern for him at this time and needs to be addressed as a  major problem.   Dictated by Catalina Pizza, M.D.      Catalina Pizza, M.D.  Electronically Signed     ZH/MEDQ  D:  06/19/2005  T:  06/19/2005  Job:  [id number]

## 2011-02-19 NOTE — Group Therapy Note (Signed)
NAME:  ENZIO, BUCHLER NO.:  [id number]   MEDICAL RECORD NO.:  [id number]          PATIENT TYPE:  INP   LOCATION:  A318                          FACILITY:  [facility]   PHYSICIAN:  Catalina Pizza, M.D.        DATE OF BIRTH:  12-Mar-1956   DATE OF PROCEDURE:  11/19/2006  DATE OF DISCHARGE:                                 PROGRESS NOTE   SUBJECTIVE:  I reviewed Mr. Thueson history and physical from Dr.  Kaleen Odea yesterday and agree the patient does have significant COPD.  Apparently, he was having several days episode of worsening shortness of  breath and cough, upper respiratory type symptoms, and continued to  decompensate and have worsening shortness of breath and came to the  emergency department and was admitted for COPD type exacerbation.  At  this time, the patient feels that he is doing much better and his  breathing is much better on oxygen, steroids, and routine antibiotic.  He denies any significant chest pain or any worsening shortness of  breath at this time.  He does still continue to have wheezing, per his  report.   PHYSICAL EXAMINATION:  VITAL SIGNS:  Temperature 98.1, blood pressure  139/87, heart rate 110, respiratory rate is 15, saturation 98% on 3  liters oxygen.  GENERAL:  This is a well nourished African American male lying in bed in  no acute distress.  LUNGS:  Show better air movement throughout, he does have expiratory  wheezing in all aspects, no significant crackles or rhonchi appreciated.  HEENT:  Oropharynx is clear.  HEART:  Regular rate and rhythm, slower heart rate than previously  mentioned.  ABDOMEN:  Soft, nontender, nondistended, positive bowel sounds.  EXTREMITIES:  No lower extremity edema.  NEUROLOGICAL:  Alert and oriented x3, no deficits.   No new lab work.  No radiologic findings.   IMPRESSION:  This is a 55 year old African American gentleman with COPD  exacerbation, question of bronchitis.   ASSESSMENT/PLAN:  COPD  exacerbation/bronchitis.  We will continue on all  of his full regimen for now.  The patient does have significant  improvement after we put him on routine breathing treatments as well as  Solu-Medrol and IV Rocephin and Zithromax.  We will continue these for  now and likely will need at least for another 24 hours before changing  over to an oral regimen.      Catalina Pizza, M.D.  Electronically Signed     ZH/MEDQ  D:  11/19/2006  T:  11/19/2006  Job:  [id number]

## 2011-02-19 NOTE — Discharge Summary (Signed)
NAME:  Jeff Ray, STUMP                 ACCOUNT NO.:  [id number]   MEDICAL RECORD NO.:  [id number]          PATIENT TYPE:  INP   LOCATION:  A332                          FACILITY:  [facility]   PHYSICIAN:  Angus G. Renard Matter, MD   DATE OF BIRTH:  [birth year]   DATE OF ADMISSION:  06/18/2005  DATE OF DISCHARGE:  09/18/2006LH                                 DISCHARGE SUMMARY   DISCHARGE DIAGNOSES:  1.  Exacerbation of chronic obstructive pulmonary disease.  2.  Hypoxia.   CONDITION ON DISCHARGE:  Stable and improved at the time of discharge.   HISTORY OF PRESENT ILLNESS:  This patient presented to the emergency  department with worsening of shortness of breath and tightness in his chest.  Approximately 2 days prior to this, he was working in the yard and started  having some allergy type symptoms with his eyes burning, increasing cough.  He had been using his albuterol nebulizer, but with increasing symptoms it  was felt he should be admitted to the hospital for further intense  treatment.  He has know hypoxia.   PHYSICAL EXAMINATION:  VITAL SIGNS:  BP 130/68, respirations 24, pulse 102.  GENERAL:  The patient is a thin, African-American male with shortness of  breath.  HEENT:  PERRLA.  TMs negative.  Oropharynx benign.  NECK:  Supple.  No JVD or thyroid abnormalities.  HEART:  Regular rate and rhythm with no murmurs.  LUNGS:  Diminished breath sounds.  ABDOMEN:  No palpable organs or masses.  EXTREMITIES:  Free of edema.   LABORATORY DATA AND X-RAY FINDINGS:  Admission CBC with WBC 7100, hemoglobin  16.1, hematocrit 46.8.  Subsequent CBC on June 19, 2005, with WBC  10,200 with hemoglobin 15.1, hematocrit 43.8.  Chemistries with sodium 136,  potassium 3.8, chloride 103, CO2 38, glucose 139, BUN 8, creatinine 1.1,  calcium 9.3.   Chest x-ray with no acute abnormalities.   HOSPITAL COURSE:  At the time of his admission, the patient was continued on  nasal O2.  He was given Solu-Medrol  125 mg intravenous every 6 hours,  Zithromax 500 mg every 24 hours, albuterol nebulizer every 6 hours, Atrovent  every 6 hours.  He was subsequently placed on Advair Diskus 250/50 b.i.d.  and Robitussin.  The patient showed progressive improvement.  He was tapered  and placed on prednisone taper.  He ambulated in the hall.  Following  admission, his O2 saturations remained fairly good.  The patient was able to  be discharged after 2 days hospitalization to follow as an outpatient.   DISCHARGE MEDICATIONS:  1.  Nasal O2 at 3 L.  2.  Zithromax 250 mg daily.  3.  Prednisone pack 12-day.  4.  Advised to continue albuterol and Advair Diskus 250/50 b.i.d.      Angus G. Renard Matter, MD  Electronically Signed     AGM/MEDQ  D:  07/05/2005  T:  07/05/2005  Job:  [id number]

## 2011-02-19 NOTE — Procedures (Signed)
NAME:  MCGWIRE, DASARO NO.:  [id number]   MEDICAL RECORD NO.:  [id number]          PATIENT TYPE:  EMS   LOCATION:  ED                            FACILITY:  [facility]   PHYSICIAN:  Edward L. Juanetta Gosling, M.D.DATE OF BIRTH:  07-Feb-1956   DATE OF PROCEDURE:  06/16/2006  DATE OF DISCHARGE:  06/16/2006                                EKG INTERPRETATION   TIME:  1848.   The rhythm is sinus tachycardia with a rate of about 106.  There is  incomplete right bundle branch block.  There is right ventricular  hypertrophy and a suggestion of chronic pulmonary disease pattern.  Abnormal  electrocardiogram.      Oneal Deputy. Juanetta Gosling, M.D.  Electronically Signed     ELH/MEDQ  D:  06/17/2006  T:  06/17/2006  Job:  [id number]

## 2011-02-19 NOTE — Discharge Summary (Signed)
NAME:  Jeff Ray, Jeff Ray                 ACCOUNT NO.:  [id number]   MEDICAL RECORD NO.:  [id number]           PATIENT TYPE:  INP   LOCATION:  A336                          FACILITY:  [facility]   PHYSICIAN:  Angus G. Renard Matter, MD   DATE OF BIRTH:  13-May-1956   DATE OF ADMISSION:  10/28/2008  DATE OF DISCHARGE:  01/29/2010LH                               DISCHARGE SUMMARY   A 55 year old African American male.  Four days' hospitalization.   DIAGNOSES:  1. Bronchopneumonia.  2. Chronic obstructive asthma.  3. Acute on chronic respiratory failure.  4. Diabetes mellitus type 2.   CONDITION:  Stable and improved at the time of his discharge.   A 55 year old [place] American male admitted through the ED with chief  problem being shortness of breath, which had become progressively worse  over the past few days.  He does have a history of COPD and asthma in  the past and been hospitalized here previously.  He does have a  documented history of asthma, COPD, and bronchitis.  He was seen by ED  physician.  Blood gases on admission showed a pH of 7.370 with a PCO2 of  68.9, PO2 68.6, and bicarb 38.9.  Cardiac markers and BMET are  essentially within normal limits.  The patient was treated in the ED.  BiPAP was started 10/6.  X-ray showed opacity in the left lung base  suspicious for pneumonia.  Cultures were obtained.  Antibiotics started.  The patient was subsequently admitted.   PHYSICAL EXAMINATION:  GENERAL:  Dyspneic white male.  VITAL SIGNS:  Blood blood pressure 122/70, pulse 80, respiration 20,  temperature 98.8.  HEENT:  Eyes, PERRLA.  TMs negative.  Oropharynx benign.  NECK:  Supple.  No JVD or thyroid abnormalities.  HEART:  Regular rhythm.  No murmurs.  LUNGS:  Diminished breath sounds bilaterally.  Rhonchi and wheezes heard  bilaterally.  ABDOMEN:  No palpable organs or mass.  No organomegaly.  EXTREMITIES:  Free of edema.  NEUROLOGIC:  No focal deficit.  SKIN:  Warm and dry.   LABORATORY DATA:  CBC; WBC 9300 with hemoglobin 15.0 and hematocrit  45.8.  Subsequent CBC on October 29, 2008; WBC 9500 with hemoglobin of  14.4, and hematocrit 42.8.  Chemistries on admission; sodium 136,  potassium 3.8, chloride 93, CO2 37, glucose 151, BUN 11, and creatinine  0.87.  Subsequent chemistries on October 29, 2008; sodium 136, potassium  4.5, chloride 90, CO2 37, glucose 289, BUN 11, and creatinine 0.96.  Blood culture, no growth in 5 days.  Cardiac markers; myoglobin 160, CPK-  MB 6.5, and troponin less than 0.05.  X-ray, right lower lobe  infiltrate.  Electrocardiogram sinus bradycardia and right bundle-branch  block.   HOSPITAL COURSE:  The patient at the time of his admission was placed on  regular diet, half-normal saline, KVO rate.  He was started on Lovenox  40 mg subcutaneously daily, IV Rocephin 1 g daily, Zithromax 500 mg  daily, nasal O2 at 4 L by cannula.  Currently, his O2 sats were  monitored.  He is  placed on IV Solu-Medrol 125 mg every 6 hours,  albuterol/Atrovent neb treatments every 4 hours, Mucinex 1200 mg b.i.d.,  Robitussin teaspoon q.8 h. p.r.n., Tylenol 500 mg q.4 h. p.r.n.,  ibuprofen 800 mg t.i.d., Allegra 108 mg daily, Xanax 0.5 mg every 4  hours p.r.n., Singulair 10 mg daily, and Vicodin 7.5/750 every 4 hours  p.r.n.  The patient was continued on this regimen.  He was seen in  consultation by Dr. Juanetta Gosling of Pulmonology.  Towards the latter part of  his hospital stay, BiPAP was discontinued during the day and used during  the night.  Because of elevated blood sugars, he was started on Levemir,  insulin 20 units each morning, and Accu-Cheks a.c. and h.s.  NovoLog  sliding scale.  Diabetic teaching was begun.  The patient improved.  Dr.  Juanetta Gosling recommend sleep study as an outpatient.   The patient was stable at the time of discharge and was discharged on  the following medications:  1. Albuterol inhaler 1 puff every 4 hours as needed.  2. Mucinex  1200 mg b.i.d.  3. Ibuprofen 800 mg 3 tablets a day as needed.  4. Singulair 10 mg daily.  5. Symbicort 1 puff twice a day.  6. Vicodin ES 1 tablet every 4 hours as needed.  7. Alprazolam 0.5 mg every 4 hours as needed.  8. Allegra 180 mg daily.  9. Medrol Dosepak for 6 days.  10.BiPAP 16/8 at night.  11.Nasal O2 at 2 L per minute.  12.Levaquin 500 mg daily.  13.Lantus insulin 20 units daily.  14.Humalog according to sliding scale.   The patient was instructed to return to the office for followup.      Angus G. Renard Matter, MD  Electronically Signed     AGM/MEDQ  D:  11/11/2008  T:  11/11/2008  Job:  [id number]

## 2011-02-19 NOTE — Group Therapy Note (Signed)
NAME:  DAYTEN, JUBA NO.:  [id number]   MEDICAL RECORD NO.:  [id number]          PATIENT TYPE:  INP   LOCATION:  A318                          FACILITY:  [facility]   PHYSICIAN:  Catalina Pizza, M.D.        DATE OF BIRTH:  04-13-1956   DATE OF PROCEDURE:  11/20/2006  DATE OF DISCHARGE:                                 PROGRESS NOTE   SUBJECTIVE:  Mr. Cadden was admitted for COPD type exacerbation, found  to have bronchitis on chest x-ray.  States that his breathing and  shortness of breath is much improved.  Felt that he did get some relief  with increasing his oxygen up to 3L yesterday, but states he is eating  and drinking fine, no significant other issues, states his wheezing is  much improved.   OBJECTIVE:  VITAL SIGNS:  Temperature is 97.7, blood pressure 138/93,  pulse 108, respirations 20-22, sating 95% on 3L oxygen.  Did have  reading on room air of 89%.  GENERAL:  This is an African-American male sitting on the side of the  bed in no acute distress.  LUNGS:  Showed much better air movement, did not appreciate any wheezing  at this time.  No significant crackles or rhonchi appreciated.  HEENT:  Unremarkable.  OROPHARYNX:  Clear.  HEART:  Regular rate and rhythm.  No murmurs, gallops or rubs.  ABDOMEN:  Soft, nontender, nondistended, positive bowel sounds.  EXTREMITIES:  No lower extremity edema.  NEUROLOGIC:  Alert and oriented x3, no deficits.   IMPRESSION:  This is a 55 year old African-American gentleman with  chronic obstructive pulmonary disease exacerbation and bronchitis.   ASSESSMENT AND PLAN:  Chronic obstructive pulmonary disease exacerbation  with bronchitis.  Continue on his current regimen of IV Rocephin and  Zithromax and Solu-Medrol through today as well as continue on the  albuterol and Atrovent nebulizer.  Apparently he does not have any more  of these at home and definitely needs to have these available.   DISPOSITION:  1. I had a long  discussion with Mr. Radel about getting him out of      the hospital first thing in the morning.  2. I have written prescriptions for prednisone taper.  3. Continue on Avelox for another week, given his bronchitis.  4. Start him on DuoNeb around the clock q.6h. for at least the next 2      weeks then cut back to twice a day.  5. Also started him on Brovana.  I believe this would help.  6. He has been attempted on Advair before but states that he has an      itching, burning sensation in his mouth even with cleaning out his      mouth, so will hold off on Pulmicort in addition to this for now,      but may be able to add this on to his neb regimen if he continues      to have issues.  7. Patient will follow up in 2 weeks in the office for further  followup.  8. Patient is to also continue on his oxygen at 2L daily.  As his      breathing improves, I believe that 2L will be      adequate for him.  Will give him MDIs as well, which he states that      the last one he got did not work as well as the previous, I      question whether this is a new formulation of the MDI and he is      encouraged to use nebulizer when at all feeling short of breath.      Catalina Pizza, M.D.  Electronically Signed     ZH/MEDQ  D:  11/20/2006  T:  11/20/2006  Job:  [id number]

## 2011-02-19 NOTE — Group Therapy Note (Signed)
NAME:  Jeff Ray, Jeff Ray                           ACCOUNT NO.:  [id number]   MEDICAL RECORD NO.:  [id number]                   PATIENT TYPE:  INP   LOCATION:  A201                                 FACILITY:  [facility]   PHYSICIAN:  Angus G. Renard Matter, M.D.              DATE OF BIRTH:  08-Jan-1956   DATE OF PROCEDURE:  12/17/2003  DATE OF DISCHARGE:                                   PROGRESS NOTE   SUBJECTIVE:  This patient was admitted to the hospital with exacerbation of  chronic obstructive pulmonary disease, bronchitis, and hypoxia.  The patient  had a CT scan done which showed evidence of emphysema, peribronchial  thickening.  He is feeling some better.   OBJECTIVE:  Vital signs:  Blood pressure 144/72, respirations 20, pulse 99,  temperature 97.2.  O2 saturation is at 100.  Lungs:  Diminished breath  sounds.  Heart:  Regular rhythm.  Abdomen:  No palpable organs or masses.   ASSESSMENT:  The patient was admitted with exacerbation of chronic  bronchitis. He has chronic obstructive pulmonary disease, hypoxia,  enteritis.   PLAN:  Continue current regimen.  Will attempt to wean the patient down from  current oxygen setting.      ___________________________________________                                            Ishmael Holter. Renard Matter, M.D.   AGM/MEDQ  D:  12/17/2003  T:  12/17/2003  Job:  [id number]
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
## 2011-02-19 NOTE — Group Therapy Note (Signed)
NAME:  Jeff Ray, Jeff Ray                           ACCOUNT NO.:  [id number]   MEDICAL RECORD NO.:  [id number]                   PATIENT TYPE:  INP   LOCATION:  A201                                 FACILITY:  [facility]   PHYSICIAN:  Angus G. Renard Matter, M.D.              DATE OF BIRTH:  1955-11-02   DATE OF PROCEDURE:  DATE OF DISCHARGE:                                   PROGRESS NOTE   SUBJECTIVE:  This patient was admitted with exacerbation of chronic  obstructive pulmonary disease, bronchitis and hypoxia.  His oxygen is being  titrated, and he still maintains adequate O2 saturation.   OBJECTIVE:  Vital signs:  Blood pressure 132/77, respirations 20, pulse 96,  temperature 97.8.  Lungs:  Diminished breath sounds.  Heart:  Regular  rhythm.  Abdomen:  No palpable organs or masses.   ASSESSMENT:  The patient is admitted to the hospital with chronic  bronchitis, chronic obstructive pulmonary disease, hypoxia.   PLAN:  Continue current regimen.      ___________________________________________                                            Ishmael Holter. Renard Matter, M.D.   AGM/MEDQ  D:  12/19/2003  T:  12/19/2003  Job:  [id number]

## 2011-02-19 NOTE — Group Therapy Note (Signed)
NAME:  Jeff Ray, Jeff Ray                           ACCOUNT NO.:  [id number]   MEDICAL RECORD NO.:  [id number]                   PATIENT TYPE:  INP   LOCATION:  A201                                 FACILITY:  [facility]   PHYSICIAN:  Angus G. Renard Matter, M.D.              DATE OF BIRTH:  [birth year]   DATE OF PROCEDURE:  12/14/2003  DATE OF DISCHARGE:                                   PROGRESS NOTE   SUBJECTIVE:  This patient was admitted to the hospital with exacerbation of  COPD, bronchitis, and hypoxia.  His O2 saturations this morning on 3 L were  49.   OBJECTIVE:  Vital signs:  Blood pressure 124/74, respirations 20, pulse 83,  temperature 97.7.  Lungs:  Expiratory wheeze.  Heart:  Regular rhythm.  Lungs:  Clear to P&A.  Abdomen:  No palpable organs or masses.   ASSESSMENT:  The patient was admitted with exacerbation of bronchitis,  chronic obstructive pulmonary disease, hypoxia, enteritis.   PLAN:  Increase O2 to 4 L/minute, continue to monitor O2 saturations.      ___________________________________________                                            Ishmael Holter Renard Matter, M.D.   AGM/MEDQ  D:  12/14/2003  T:  12/14/2003  Job:  [id number]

## 2011-02-19 NOTE — Consult Note (Signed)
NAME:  Jeff Ray, Jeff Ray                           ACCOUNT NO.:  [id number]   MEDICAL RECORD NO.:  [id number]                   PATIENT TYPE:  INP   LOCATION:  A201                                 FACILITY:  [facility]   PHYSICIAN:  Edward L. Juanetta Gosling, M.D.             DATE OF BIRTH:  17-Feb-1956   DATE OF CONSULTATION:  12/16/2003  DATE OF DISCHARGE:                                   CONSULTATION   REASON FOR CONSULTATION:  COPD with hypoxemia.   HISTORY:  Mr. Mcneill is a 55 year old African-American male who has a  history of what is probably COPD although he says he has not had a definite  diagnosis made.  He does have a long smoking history and has been in the  hospital on at least one other occasion with something similar to this.  He  became very congested, he was coughing a lot, and actually passed out,  according to family.  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  He has been started on standard  respiratory treatments and has improved, but has remained somewhat  hypoxemic.   PAST MEDICAL HISTORY:  Positive for what he describes as asthma and he has  been on Advair but I do not know the dose, Theo-24, Singulair, and  albuterol.  He has a history of depression for which he is taking Paxil.   FAMILY HISTORY:  His family history is negative for any sort of COPD as far  as he knows.   SOCIAL HISTORY:  He works as a Naval architect.  He smokes at least one pack of  cigarettes daily and maybe more than that.  He does not use any street  drugs or alcohol.   REVIEW OF SYSTEMS:  Except as mentioned is negative.   PHYSICAL EXAMINATION:  GENERAL:  Shows that he appears comfortable and in no  acute distress now.  VITAL SIGNS:  His blood pressure is 112/72, respirations are 18.  HEENT:  Shows his pupils are reactive to light and accommodation.  Nose and  throat  are clear.  NECK:  Supple without masses.  CHEST:  Clear but it has rhonchi bilaterally.  HEART:  Regular.  ABDOMEN:  Soft.  EXTREMITIES:  Showed no edema.  NEUROLOGIC:  Grossly intact.   ASSESSMENT:  He has exacerbation of chronic obstructive pulmonary disease  and he is fairly markedly hypoxemic, and because of that I think we should  try to get a CT of his chest.  I would like to make certain that he does not  have a pulmonary embolism or that he does not have some other pulmonary  pathology that we might not have seen on his standard chest x-ray.  I have  told him that he  definitely needs to work on his smoking at this point,  and he understands but is having a difficult time with it.  Otherwise, I  would not change his treatments but I would like to get a little bit more  information as mentioned.   Thank you for allowing me to see him with you.  He will need a PFT as well.      ___________________________________________                                            Oneal Deputy. Juanetta Gosling, M.D.   ELH/MEDQ  D:  12/16/2003  T:  12/17/2003  Job:  [id number]

## 2011-04-02 ENCOUNTER — Emergency Department (HOSPITAL_COMMUNITY): Payer: Medicare HMO

## 2011-04-02 ENCOUNTER — Emergency Department (HOSPITAL_COMMUNITY)
Admission: EM | Admit: 2011-04-02 | Discharge: 2011-04-02 | Disposition: A | Discharge status: Home / Self Care | Payer: Medicare HMO | Attending: Emergency Medicine | Admitting: Emergency Medicine

## 2011-04-02 DIAGNOSIS — E119 Type 2 diabetes mellitus without complications: Secondary | ICD-10-CM | POA: Insufficient documentation

## 2011-04-02 DIAGNOSIS — J449 Chronic obstructive pulmonary disease, unspecified: Secondary | ICD-10-CM | POA: Insufficient documentation

## 2011-04-02 DIAGNOSIS — F411 Generalized anxiety disorder: Secondary | ICD-10-CM | POA: Insufficient documentation

## 2011-04-02 DIAGNOSIS — Z79899 Other long term (current) drug therapy: Secondary | ICD-10-CM | POA: Insufficient documentation

## 2011-04-02 DIAGNOSIS — J4489 Other specified chronic obstructive pulmonary disease: Secondary | ICD-10-CM | POA: Insufficient documentation

## 2011-04-02 DIAGNOSIS — J189 Pneumonia, unspecified organism: Secondary | ICD-10-CM | POA: Insufficient documentation

## 2011-04-02 DIAGNOSIS — E7800 Pure hypercholesterolemia, unspecified: Secondary | ICD-10-CM | POA: Insufficient documentation

## 2011-04-02 DIAGNOSIS — Z981 Arthrodesis status: Secondary | ICD-10-CM | POA: Insufficient documentation

## 2011-04-02 LAB — CBC
MCH: 29.7 pg (ref 26.0–34.0)
MCHC: 31.5 g/dL (ref 30.0–36.0)
MCV: 94.4 fL (ref 78.0–100.0)
Platelets: 187 10*3/uL (ref 150–400)
RBC: 4.64 MIL/uL (ref 4.22–5.81)

## 2011-04-02 LAB — DIFFERENTIAL
Basophils Relative: 0 % (ref 0–1)
Eosinophils Absolute: 0 10*3/uL (ref 0.0–0.7)
Eosinophils Relative: 0 % (ref 0–5)
Lymphs Abs: 0.4 10*3/uL — ABNORMAL LOW (ref 0.7–4.0)
Monocytes Absolute: 0.1 10*3/uL (ref 0.1–1.0)
Monocytes Relative: 1 % — ABNORMAL LOW (ref 3–12)

## 2011-04-02 LAB — BASIC METABOLIC PANEL
Calcium: 9.6 mg/dL (ref 8.4–10.5)
Creatinine, Ser: 0.77 mg/dL (ref 0.50–1.35)
GFR calc non Af Amer: >60 mL/min (ref >60)
Sodium: 134 mEq/L — ABNORMAL LOW (ref 135–145)

## 2011-04-06 ENCOUNTER — Inpatient Hospital Stay (HOSPITAL_COMMUNITY)
Admission: EM | Admit: 2011-04-06 | Discharge: 2011-04-09 | DRG: 192 | Disposition: A | Discharge status: Home / Self Care | Payer: Medicare HMO | Attending: Family Medicine | Admitting: Family Medicine

## 2011-04-06 ENCOUNTER — Emergency Department (HOSPITAL_COMMUNITY): Payer: Medicare HMO

## 2011-04-06 DIAGNOSIS — J441 Chronic obstructive pulmonary disease with (acute) exacerbation: Principal | ICD-10-CM | POA: Diagnosis present

## 2011-04-06 DIAGNOSIS — E785 Hyperlipidemia, unspecified: Secondary | ICD-10-CM | POA: Diagnosis present

## 2011-04-06 DIAGNOSIS — E119 Type 2 diabetes mellitus without complications: Secondary | ICD-10-CM | POA: Diagnosis present

## 2011-04-06 LAB — BLOOD GAS, ARTERIAL
Acid-Base Excess: 16 mmol/L — ABNORMAL HIGH (ref 0.0–2.0)
Bicarbonate: 44.6 mEq/L — ABNORMAL HIGH (ref 20.0–24.0)
Bicarbonate: 41.3 mEq/L — ABNORMAL HIGH (ref 20.0–24.0)
Delivery systems: POSITIVE
Expiratory PAP: 8
Inspiratory PAP: 20
O2 Content: 40 L/min
O2 Saturation: 94.8 %
Patient temperature: 37
TCO2: 40.8 mmol/L (ref 0–100)
pCO2 arterial: 124 mmHg (ref 35.0–45.0)
pH, Arterial: 7.182 — CL (ref 7.350–7.450)
pH, Arterial: 7.29 — ABNORMAL LOW (ref 7.350–7.450)
pO2, Arterial: 337 mmHg — ABNORMAL HIGH (ref 80.0–100.0)

## 2011-04-06 LAB — COMPREHENSIVE METABOLIC PANEL
ALT: 14 U/L (ref 0–53)
AST: 15 U/L (ref 0–37)
Alkaline Phosphatase: 114 U/L (ref 39–117)
Calcium: 10.7 mg/dL — ABNORMAL HIGH (ref 8.4–10.5)
GFR calc Af Amer: >60 mL/min (ref >60)
Glucose, Bld: 172 mg/dL — ABNORMAL HIGH (ref 70–99)
Potassium: 4.2 mEq/L (ref 3.5–5.1)
Sodium: 135 mEq/L (ref 135–145)
Total Protein: 8.1 g/dL (ref 6.0–8.3)

## 2011-04-06 LAB — CBC
Hemoglobin: 15.2 g/dL (ref 13.0–17.0)
MCH: 30 pg (ref 26.0–34.0)
MCHC: 31.3 g/dL (ref 30.0–36.0)
Platelets: 214 10*3/uL (ref 150–400)
RBC: 5.07 MIL/uL (ref 4.22–5.81)

## 2011-04-06 LAB — DIFFERENTIAL
Basophils Absolute: 0 10*3/uL (ref 0.0–0.1)
Eosinophils Absolute: 0 10*3/uL (ref 0.0–0.7)
Lymphocytes Relative: 28 % (ref 12–46)
Monocytes Absolute: 0.6 10*3/uL (ref 0.1–1.0)
Neutro Abs: 4.9 10*3/uL (ref 1.7–7.7)
Neutrophils Relative %: 64 % (ref 43–77)

## 2011-04-07 LAB — BASIC METABOLIC PANEL
CO2: 40 mEq/L (ref 19–32)
Calcium: 10.3 mg/dL (ref 8.4–10.5)
GFR calc non Af Amer: >60 mL/min (ref >60)
Potassium: 4.4 mEq/L (ref 3.5–5.1)
Sodium: 133 mEq/L — ABNORMAL LOW (ref 135–145)

## 2011-04-07 LAB — BLOOD GAS, ARTERIAL
Bicarbonate: 40.7 mEq/L — ABNORMAL HIGH (ref 20.0–24.0)
Delivery systems: POSITIVE
FIO2: 0.35 %
Mode: POSITIVE
O2 Saturation: 97.2 %
Patient temperature: 37
TCO2: 35.6 mmol/L (ref 0–100)

## 2011-04-07 LAB — COMPREHENSIVE METABOLIC PANEL
Alkaline Phosphatase: 111 U/L (ref 39–117)
BUN: 16 mg/dL (ref 6–23)
Creatinine, Ser: 0.7 mg/dL (ref 0.50–1.35)
GFR calc Af Amer: >60 mL/min (ref >60)
Glucose, Bld: 213 mg/dL — ABNORMAL HIGH (ref 70–99)
Potassium: 4.7 mEq/L (ref 3.5–5.1)
Total Bilirubin: 0.3 mg/dL (ref 0.3–1.2)
Total Protein: 7.9 g/dL (ref 6.0–8.3)

## 2011-04-07 LAB — GLUCOSE, CAPILLARY
Glucose-Capillary: 232 mg/dL — ABNORMAL HIGH (ref 70–99)
Glucose-Capillary: 173 mg/dL — ABNORMAL HIGH (ref 70–99)

## 2011-04-07 LAB — TROPONIN I: Troponin I: <0.3 ng/mL (ref <0.30)

## 2011-04-08 LAB — BLOOD GAS, ARTERIAL
Bicarbonate: 38.3 mEq/L — ABNORMAL HIGH (ref 20.0–24.0)
Expiratory PAP: 8
FIO2: 0.3 %
TCO2: 33 mmol/L (ref 0–100)
pCO2 arterial: 52.9 mmHg — ABNORMAL HIGH (ref 35.0–45.0)
pH, Arterial: 7.473 — ABNORMAL HIGH (ref 7.350–7.450)
pO2, Arterial: 80.9 mmHg (ref 80.0–100.0)

## 2011-04-08 LAB — GLUCOSE, CAPILLARY
Glucose-Capillary: 209 mg/dL — ABNORMAL HIGH (ref 70–99)
Glucose-Capillary: 183 mg/dL — ABNORMAL HIGH (ref 70–99)

## 2011-04-08 NOTE — Group Therapy Note (Signed)
  NAME:  Jeff Ray, Jeff Ray                 ACCOUNT NO.:  [id number]  MEDICAL RECORD NO.:  [id number]  LOCATION:  IC09                          FACILITY:  [facility]  PHYSICIAN:  Catherina Pates G. Renard Matter, MD   DATE OF BIRTH:  07/04/1956  DATE OF PROCEDURE: DATE OF DISCHARGE:                                PROGRESS NOTE   This patient has end-stage COPD with respiratory distress and elevated pCO2 initially.  He has improved.  His most current blood gases, pH 7.380, pCO2 70.3, pO2 96, bicarb 40.7, troponin done yesterday less than 0.30.  OBJECTIVE:  LUNGS:  Diminished breath sounds bilaterally.  No rhonchi. HEART:  Regular rhythm. ABDOMEN:  No palpable organs or masses.  ASSESSMENT:  The patient admitted with exacerbation of chronic obstructive pulmonary disease, hypoxia, and hypercapnia.  PLAN:  To continue regimen.  We will continue IV Solu-Medrol, neb treatments and NovoLog insulin.  We will move the patient out of the unit if his blood gases are better today.  Asees Manfredi G. Renard Matter, MD     AGM/MEDQ  D:  04/08/2011  T:  04/08/2011  Job:  [id number]  Electronically Signed by Butch Penny MD on 04/08/2011 06:58:22 AM

## 2011-04-08 NOTE — Group Therapy Note (Signed)
  NAME:  GILLIAM, HAWKES                 ACCOUNT NO.:  [id number]  MEDICAL RECORD NO.:  [id number]  LOCATION:  IC09                          FACILITY:  [facility]  PHYSICIAN:  Umi Mainor G. Renard Matter, MD   DATE OF BIRTH:  1955-11-18  DATE OF PROCEDURE:  04/07/2011 DATE OF DISCHARGE:                                PROGRESS NOTE   This patient has end-stage COPD.  He came in with respiratory distress and elevated PCO2.  His blood gases on admission showed a pH of 7.18 with a PCO2 of 124, PO2 of 100%.  He was placed on BiPAP and gases subsequently improved.  He did have a chest x-ray which did not show acute infiltrate, soft tissue density is seen in left upper lobe, not definitely seen on prior study, cannot not exclude the left upper lobe pulmonary nodule, emphysematous changes noted.  The patient's most recent blood gases; pH of 7.380 with a PCO2 of 70.3, PO2 of 96.0, and bicarb 40.7.  PHYSICAL EXAMINATION:  VITAL SIGNS:  Blood pressure 152/97, pulse rate 108, and O2 saturation 100%. LUNGS:  Diminished breath sounds.  No rhonchi. HEART:  Regular rhythm. ABDOMEN:  No palpable organs or masses.  ASSESSMENT:  The patient was admitted with exacerbation of chronic obstructive pulmonary disease, hypoxia, and hypercapnia.  PLAN:  Continue current regimen.  Continue IV Solu-Medrol, neb treatments, NovoLog insulin.  We will start the patient back on Levaquin p.o. 750 mg daily.     Dani Danis G. Renard Matter, MD     AGM/MEDQ  D:  04/07/2011  T:  04/07/2011  Job:  [id number]  Electronically Signed by Butch Penny MD on 04/08/2011 06:58:12 AM

## 2011-04-09 ENCOUNTER — Inpatient Hospital Stay (HOSPITAL_COMMUNITY): Payer: Medicare HMO

## 2011-04-09 LAB — BLOOD GAS, ARTERIAL
Bicarbonate: 34.8 mEq/L — ABNORMAL HIGH (ref 20.0–24.0)
Patient temperature: 37
TCO2: 30 mmol/L (ref 0–100)
pCO2 arterial: 51.5 mmHg — ABNORMAL HIGH (ref 35.0–45.0)
pH, Arterial: 7.445 (ref 7.350–7.450)

## 2011-04-09 LAB — GLUCOSE, CAPILLARY: Glucose-Capillary: 182 mg/dL — ABNORMAL HIGH (ref 70–99)

## 2011-04-09 MED ORDER — INSULIN ASPART 100 UNIT/ML ~~LOC~~ SOLN: 0.0000 [IU] | Freq: Every day | SUBCUTANEOUS | Status: DC

## 2011-04-09 MED ORDER — MONTELUKAST SODIUM 10 MG PO TABS: 10.0000 mg | ORAL_TABLET | Freq: Every day | ORAL | Status: DC

## 2011-04-09 MED ORDER — INSULIN ASPART 100 UNIT/ML ~~LOC~~ SOLN: 0.0000 [IU] | Freq: Three times a day (TID) | SUBCUTANEOUS | Status: DC

## 2011-04-09 MED ORDER — NICOTINE 21 MG/24HR TD PT24: 21.0000 mg | MEDICATED_PATCH | Freq: Every day | TRANSDERMAL | Status: DC

## 2011-04-09 MED ORDER — ALBUTEROL SULFATE (2.5 MG/3ML) 0.083% IN NEBU: 2.5000 mg | INHALATION_SOLUTION | RESPIRATORY_TRACT | Status: DC

## 2011-04-09 MED ORDER — ENOXAPARIN SODIUM 40 MG/0.4ML ~~LOC~~ SOLN: 40.0000 mg | SUBCUTANEOUS | Status: DC

## 2011-04-09 MED ORDER — METFORMIN HCL 500 MG PO TABS: 500.0000 mg | ORAL_TABLET | Freq: Two times a day (BID) | ORAL | Status: DC

## 2011-04-09 MED ORDER — LEVOFLOXACIN 500 MG PO TABS: 750.0000 mg | ORAL_TABLET | Freq: Every day | ORAL | Status: DC

## 2011-04-09 MED ORDER — ROSUVASTATIN CALCIUM 20 MG PO TABS: 10.0000 mg | ORAL_TABLET | Freq: Every day | ORAL | Status: DC

## 2011-04-09 MED ORDER — ALPRAZOLAM 0.5 MG PO TABS: 0.5000 mg | ORAL_TABLET | ORAL | Status: DC | PRN

## 2011-04-09 MED ORDER — GUAIFENESIN ER 600 MG PO TB12: 600.0000 mg | ORAL_TABLET | Freq: Two times a day (BID) | ORAL | Status: DC

## 2011-04-09 MED ORDER — IPRATROPIUM BROMIDE 0.02 % IN SOLN: 0.5000 mg | RESPIRATORY_TRACT | Status: DC

## 2011-04-09 MED ORDER — DILTIAZEM HCL 100 MG IV SOLR
5.0000 mg/h | INTRAVENOUS | Status: DC
  Filled 2011-04-09: qty 100

## 2011-04-10 NOTE — Discharge Summary (Signed)
NAME:  RAM, HAUGAN                 ACCOUNT NO.:  [id number]  MEDICAL RECORD NO.:  [id number]  LOCATION:  A335                          FACILITY:  [facility]  PHYSICIAN:  Octivia Canion G. Renard Matter, MD   DATE OF BIRTH:  03/17/1956  DATE OF ADMISSION:  04/06/2011 DATE OF DISCHARGE:  07/06/2012LH                              DISCHARGE SUMMARY   This 55 year old Afro American male was admitted on April 06, 2011, discharged on April 09, 2011.  Three days' hospitalization.  DIAGNOSES: 1. End-stage chronic obstructive pulmonary disease and exacerbation. 2. Respiratory insufficiency with pCO2 of 122. 3. Hyperlipidemia. 4. Type 2 diabetes.  CONDITION:  Stable and improved at the time of his discharge.  This patient came in to the hospital with respiratory distress and grossly elevated pCO2.  He had lethargy and had hypoxemia.  Initial blood gases showed a pH of 7.18 with pCO2 of 124 and a pO2 of 337 on 100% oxygen.  He was placed on BiPAP 40%.  This subsequently improved. His pH of 7.29 with a pCO2 of 88 and pO2 of 77.  The patient denied any cough or did have dyspnea.  Chest x-ray revealed a questionable left upper lobe 10.14 mm mass which is new from previous episode and it could be artifact.  He had been on Levaquin up until this admission, this was 500 mg daily.  He was placed in ICU on BiPAP 40%, was given Solu-Medrol, DuoNeb treatments.  PHYSICAL EXAMINATION ON ADMISSION:  GENERAL/VITAL SIGNS:  Alert male with blood pressure 140/100, pulse rate 122, respiratory rate 22.  The patient is on BiPAP. HEENT:  Eyes:  PERRLA.  TMs negative.  Oropharynx benign. NECK:  Supple.  No JVD or thyroid abnormalities. LUNGS:  Prolonged inspiratory and expiratory phase of breathing, diminished breath sounds.  No rales. HEART:  Regular rhythm.  No murmurs. ABDOMEN:  No palpable organs or masses.  No organomegaly. NEUROLOGIC:  The patient responds to verbal and tactile stimuli.  LAB DATA:  CBC on admission, WBC 7600  with hemoglobin 15.2, hematocrit 48.6.  Blood gases on admission pH 7.182 with pCO2 of 124, pO2 of 337, bicarbonate 44.6.  Subsequent blood gases shortly after admission pH 7.290 with pCO2 of 88.6 and pO2 of 77.6.  A CMP on admission, sodium 132, potassium 4.7, chloride 86, CO2 of 43, glucose 213, BUN 16, creatinine 0.70.  Subsequent BMET, sodium 133, potassium 4.4, chloride 85, CO2 40, glucose 222, BUN 18, creatinine 0.77.  Subsequent blood gases on April 08, 2011, pH 7.473 with pCO2 of 52.9 and pO2 of 80.9. Troponin less than 0.03.  Chest x-ray on April 06, 2011, showed evidence of emphysematous and bronchitic changes.  No acute infiltrate, left upper lobe nodular density 14 x 10 mm not definitely seen on previous study.  Subsequent chest x-ray, normal heart size and vascularity, persistent nodular density, noted underlying nodule not excluded. Recommend follow up CT.  Negative for CHF or pneumonia.  HOSPITAL COURSE:  The patient at the time of his admission was placed on BiPAP at 40% FIO2 19/8, Lovenox 30 mg subcu q.24 h, he was placed on sliding scale insulin and continued on Levaquin 750 mg daily,  albuterol neb treatments were continued.  He was placed on Solu-Medrol 125 mg every 6 hours and continued on metoprolol 5 mg daily, Singulair 10 mg daily, nicotine patch, Protonix 40 mg daily, Crestor 10 mg daily, and p.r.n. Xanax.  The patient's blood gas has improved considerably during his stay.  He was moved out of Intensive Care Unit to the floor on April 08, 2011, it was felt that after 3 days hospitalization, he could be discharged home.  DISCHARGED ON FOLLOWING MEDICATIONS: 1. Levaquin 750 mg daily. 2. Alprazolam 0.5 mg every 4 hours as needed. 3. Albuterol inhaler every 6 hours as needed. 4. Albuterol nebulization every 4 hours as needed. 5. Mucinex 600 mg daily. 6. Hydromet 1 teaspoon every 4 hours p.r.n. 7. Ibuprofen 200 mg 2 tablets every 6 hours as needed. 8. Metformin 500 mg 2  tablets b.i.d. 9. Symbicort 2 puffs b.i.d. 10.Simvastatin 40 mg daily. 11.Levaquin 750 mg daily. 12.Tapered dose of Medrol Dosepak starting at a.m.     Ray Gervasi G. Renard Matter, MD     AGM/MEDQ  D:  04/09/2011  T:  04/10/2011  Job:  [id number]

## 2011-04-14 ENCOUNTER — Ambulatory Visit (HOSPITAL_COMMUNITY)
Admission: RE | Admit: 2011-04-14 | Discharge: 2011-04-14 | Disposition: A | Discharge status: Home / Self Care | Payer: Medicare HMO | Source: Ambulatory Visit | Attending: Family Medicine | Admitting: Family Medicine

## 2011-04-14 DIAGNOSIS — R0989 Other specified symptoms and signs involving the circulatory and respiratory systems: Secondary | ICD-10-CM | POA: Insufficient documentation

## 2011-04-14 DIAGNOSIS — R0609 Other forms of dyspnea: Secondary | ICD-10-CM | POA: Insufficient documentation

## 2011-04-14 LAB — BLOOD GAS, ARTERIAL
Acid-Base Excess: 6.3 mmol/L — ABNORMAL HIGH (ref 0.0–2.0)
O2 Saturation: 91.8 %
Patient temperature: 37
TCO2: 27.9 mmol/L (ref 0–100)
pCO2 arterial: 57.9 mmHg (ref 35.0–45.0)

## 2011-05-03 NOTE — Progress Notes (Signed)
  NAME:  Jeff Ray, Jeff Ray                 ACCOUNT NO.:  [id number]  MEDICAL RECORD NO.:  [id number]  LOCATION:                                 FACILITY:  PHYSICIAN:  Koleson Reifsteck G. Renard Matter, MD   DATE OF BIRTH:  1955/12/10  DATE OF PROCEDURE: DATE OF DISCHARGE:                                PROGRESS NOTE   This patient has end-stage COPD, was admitted with respiratory distress and hypoxia.  He is feeling some better.  His most recent blood gases showed a pH of 7.473 with a pCO2 of 52.9 and pO2 of 80.9.  OBJECTIVE:  VITAL SIGNS:  Blood pressure 120/77, respirations 18, pulse 94, temperature 98.0.  Sugars run from 163-165. HEART:  Regular rhythm. LUNGS:  Rhonchi bilaterally.  ASSESSMENT:  The patient has end-stage chronic obstructive pulmonary disease, was admitted with hypoxia, hypercapnia.  We will continue IV Solu-Medrol, neb treatments, and NovoLog insulin and change the nasal O2 by cannula.     Lennex Pietila G. Renard Matter, MD     AGM/MEDQ  D:  04/09/2011  T:  04/09/2011  Job:  [id number]  Electronically Signed by Butch Penny MD on 05/03/2011 06:52:33 AM

## 2011-05-03 NOTE — H&P (Unsigned)
NAME:  Jeff Ray, Jeff Ray                 ACCOUNT NO.:  [id number]  MEDICAL RECORD NO.:  [id number]  LOCATION:                                 FACILITY:  PHYSICIAN:  Raijon Lindfors G. Renard Matter, MD   DATE OF BIRTH:  1956/06/08  DATE OF ADMISSION: DATE OF DISCHARGE:  LH                             HISTORY & PHYSICAL   This 55 year old Afro American male does have chronic COPD and is on nasal oxygen.  He did have prior admissions for this condition.  The patient developed increased respiratory distress with elevated pCO2. Blood gases on admission showed a pH of 7.18 with pCO2 of 124, pO2 of 100%.  He was placed on BiPAP in the ED, subsequently improved.  Did have a chest x-ray which did not show acute infiltrate.  He does have soft tissue density in left upper lobe.  The patient's most recent blood gas, pH of 7.380 with pCO2 70.3, pO2 96.0, and bicarb 40.7.  SOCIAL HISTORY:  The patient does not smoke or drink alcohol.  FAMILY HISTORY:  Not available.  PAST MEDICAL AND SURGICAL HISTORY:  The patient does have past history of chronic asthma, COPD, previous admissions for bronchopneumonia.  ALLERGIES:  The patient has no known allergies.  REVIEW OF SYSTEMS:  HEENT: Negative.  CARDIOPULMONARY:  Increased cough and dyspnea.  GI:  No bowel irregularity.  GU:  No dysuria, hematuria.  PHYSICAL EXAMINATION:  GENERAL:  On admission, the patient is in fair amount of respiratory distress. VITAL SIGNS:  Blood pressure 152/97, pulse rate 108, temperature 98. HEENT:  Eyes: PERRLA.  TMs negative.  Oropharynx benign. NECK:  Supple.  No JVD or thyroid abnormalities. HEART:  Regular rhythm with no murmurs. LUNGS:  Scattered rhonchi and wheezes posteriorly. ABDOMEN:  No palpable organs or masses.  No organomegaly. EXTREMITIES:  Free of edema.  ASSESSMENT:  The patient was admitted with exacerbation of chronic obstructive pulmonary disease, hypoxia, and hypercapnia.     Jeff Mendell G. Renard Matter,  MD     AGM/MEDQ  D:  05/03/2011  T:  05/03/2011  Job:  [id number]

## 2011-05-11 NOTE — H&P (Signed)
NAME:  Jeff Ray, Jeff Ray NO.:  [id number]  MEDICAL RECORD NO.:  [id number]  LOCATION:  IC09                          FACILITY:  [facility]  PHYSICIAN:  Melvyn Novas, MDDATE OF BIRTH:  October 17, 1955  DATE OF ADMISSION:  04/06/2011 DATE OF DISCHARGE:  LH                             HISTORY & PHYSICAL   HISTORY OF PRESENT ILLNESS:  The patient is a 55 year old black male patient of Dr. Renard Matter with end-stage COPD and was recently discharged from hospital approximately a week and a half ago, has been currently taking Levaquin.  The patient currently has BiPAP and he is a poor historian.  The patient came in with respiratory __________ and respiratory insufficiency with grossly elevated pCO2, some lethargy and hypoxemia.  Initial blood gas showed pH is 7.18, pCO2 of 124, and a pO2 of 337 on 100%.  He was placed on BiPAP 40%, this subsequently improved to a pH of 7.29, pCO2 down to 88, and pO2 of 77.  This is acute on chronic.  The patient denies cough but does have some dyspnea.  Denies the sputum, hemoptysis.  Chest x-ray reveals a questionable left upper lobe 10.14 mm mass which is new from previous episode could be artifact. No definite infiltrate seen.  He is afebrile and has been on Levaquin up until this admission 500 mg.  The patient will be admitted, placed in ICU, placed on BiPAP 40% and monitored his oxygenation status given IV Solu-Medrol, DuoNeb nebulizer q.4 h around-the-clock, and we will monitor his hemodynamic status and blood pressure is currently 140/100, and he is on no antihypertensive medicines.  Past medical history significant for the aforementioned end-stage COPD, type 2 diabetes, hyperlipidemia, and history of maxillary sinusitis.  PAST SURGICAL HISTORY:  Unremarkable.  CURRENT MEDICATIONS: 1. Xanax 0.5 q.4 h p.r.n. 2. Singulair 10 mg p.o. daily. 3. Allegra 180 mg p.o. daily. 4. Levaquin 500 mg p.o. daily. 5. Metformin 500 mg p.o.  b.i.d. 6. Mucinex 600 mg p.o. b.i.d.  The patient states he is compliant.  PHYSICAL EXAMINATION:  VITAL SIGNS:  Blood pressure is 140/100.  The patient is currently afebrile, respiratory rate is 22 on BiPAP and pulse rate is 122 and sinus. EYES:  PERRLA.  Extraocular movements are intact.  Sclerae clear. Conjunctivae pink. NECK:  No JVD, no carotid bruits.  No thyromegaly or bruits. LUNGS:  Prolonged inspiratory and expiratory phase.  Diminished breath sounds at the bases.  No rales appreciable.  No rhonchi. HEART:  Regular rhythm.  No murmurs, gallops, heaves, thrills, or rubs. ABDOMEN:  Soft, nontender.  Bowel sounds normoactive.  No guarding, rebound, mass, or megaly.  EXTREMITIES:  Trace to 1+ pedal edema. NEUROLOGIC:  The patient responds to verbal and tactile Similac.  He is oriented in three spheres.  IMPRESSION:  As follows: 1. Chronic obstructive pulmonary disease exacerbation. 2. Respiratory insufficiency with initial pCO2 of 122 now and     improving on BiPAP. 3. Hyperlipidemia. 4. Type 2 diabetes. 5. Questionable 10 x 14 mm left upper lobe new questionable nodular     density, could be artifact.  PLAN:  The plan is to admit, IV Solu-Medrol 125 q.6 h,  BiPAP is ordered, DuoNeb nebulizer q.4 h p.r.n. a.c. and bedtime glucoses, Lovenox 30 mg I will not elect to give antibiotics as he has been placed  them and there is no definite infiltrate or clinical stigmata of pneumonia, and I will make further recommendations as the database expands.     Melvyn Novas, MD     RMD/MEDQ  D:  04/06/2011  T:  04/07/2011  Job:  [id number]  Electronically Signed by Oval Linsey MD on 05/11/2011 03:13:01 PM

## 2011-06-02 ENCOUNTER — Encounter (HOSPITAL_COMMUNITY): Payer: Self-pay | Admitting: *Deleted

## 2011-06-02 ENCOUNTER — Emergency Department (HOSPITAL_COMMUNITY): Payer: Medicare HMO

## 2011-06-02 ENCOUNTER — Inpatient Hospital Stay (HOSPITAL_COMMUNITY)
Admission: EM | Admit: 2011-06-02 | Discharge: 2011-06-08 | DRG: 189 | Disposition: A | Discharge status: Home / Self Care | Payer: Medicare HMO | Attending: Family Medicine | Admitting: Family Medicine

## 2011-06-02 DIAGNOSIS — T380X5A Adverse effect of glucocorticoids and synthetic analogues, initial encounter: Secondary | ICD-10-CM | POA: Diagnosis present

## 2011-06-02 DIAGNOSIS — J9620 Acute and chronic respiratory failure, unspecified whether with hypoxia or hypercapnia: Principal | ICD-10-CM | POA: Diagnosis present

## 2011-06-02 DIAGNOSIS — R7309 Other abnormal glucose: Secondary | ICD-10-CM | POA: Diagnosis present

## 2011-06-02 DIAGNOSIS — I10 Essential (primary) hypertension: Secondary | ICD-10-CM | POA: Diagnosis present

## 2011-06-02 DIAGNOSIS — E872 Acidosis, unspecified: Secondary | ICD-10-CM | POA: Diagnosis present

## 2011-06-02 DIAGNOSIS — J449 Chronic obstructive pulmonary disease, unspecified: Secondary | ICD-10-CM

## 2011-06-02 DIAGNOSIS — J441 Chronic obstructive pulmonary disease with (acute) exacerbation: Secondary | ICD-10-CM | POA: Diagnosis present

## 2011-06-02 DIAGNOSIS — F411 Generalized anxiety disorder: Secondary | ICD-10-CM | POA: Diagnosis present

## 2011-06-02 HISTORY — DX: Anxiety disorder, unspecified: F41.9

## 2011-06-02 HISTORY — DX: Chronic obstructive pulmonary disease, unspecified: J44.9

## 2011-06-02 HISTORY — DX: Essential (primary) hypertension: I10

## 2011-06-02 MED ORDER — IPRATROPIUM BROMIDE 0.02 % IN SOLN
0.5000 mg | Freq: Once | RESPIRATORY_TRACT | Status: AC
  Administered 2011-06-02: 0.5 mg via RESPIRATORY_TRACT
  Filled 2011-06-02: qty 2.5

## 2011-06-02 MED ORDER — ALBUTEROL SULFATE (5 MG/ML) 0.5% IN NEBU
2.5000 mg | INHALATION_SOLUTION | Freq: Once | RESPIRATORY_TRACT | Status: AC
  Administered 2011-06-02: 2.5 mg via RESPIRATORY_TRACT
  Filled 2011-06-02: qty 0.5

## 2011-06-02 NOTE — ED Notes (Signed)
Pt  Reports SOB x 2 days, worse today. ems started iv, gave dou neb, albuterol, 125mg  solumederol.

## 2011-06-02 NOTE — ED Notes (Signed)
Respiratory paged for a breathing treatment at this time.  

## 2011-06-02 NOTE — ED Notes (Signed)
Pt completed breathing tx from EMS. Pt placed on 3l o2 via [place].

## 2011-06-02 NOTE — ED Notes (Signed)
edp advised pt still w/ labored respirations at this time. Pt is not as labored as when arrived but is still using abdominal muscles w/ expirations.

## 2011-06-02 NOTE — ED Notes (Signed)
Respiratory paged to bring bypap down at this time.

## 2011-06-03 ENCOUNTER — Inpatient Hospital Stay (HOSPITAL_COMMUNITY): Payer: Medicare HMO

## 2011-06-03 ENCOUNTER — Encounter (HOSPITAL_COMMUNITY): Payer: Self-pay | Admitting: *Deleted

## 2011-06-03 LAB — CBC
HCT: 47.9 % (ref 39.0–52.0)
MCV: 94.5 fL (ref 78.0–100.0)
RDW: 13.3 % (ref 11.5–15.5)
WBC: 13.1 10*3/uL — ABNORMAL HIGH (ref 4.0–10.5)

## 2011-06-03 LAB — BLOOD GAS, ARTERIAL
Acid-Base Excess: 6.1 mmol/L — ABNORMAL HIGH (ref 0.0–2.0)
Bicarbonate: 34 mEq/L — ABNORMAL HIGH (ref 20.0–24.0)
Expiratory PAP: 9
FIO2: 30 %
Inspiratory PAP: 20
O2 Content: 2 L/min
O2 Content: 30 L/min
O2 Saturation: 93.7 %
O2 Saturation: 90.1 %
Patient temperature: 37
Patient temperature: 37
pH, Arterial: 7.121 — CL (ref 7.350–7.450)
pO2, Arterial: 73.1 mmHg — ABNORMAL LOW (ref 80.0–100.0)

## 2011-06-03 LAB — DIFFERENTIAL
Basophils Absolute: 0 10*3/uL (ref 0.0–0.1)
Eosinophils Relative: 1 % (ref 0–5)
Lymphocytes Relative: 9 % — ABNORMAL LOW (ref 12–46)
Monocytes Absolute: 0.4 10*3/uL (ref 0.1–1.0)

## 2011-06-03 LAB — COMPREHENSIVE METABOLIC PANEL
AST: 13 U/L (ref 0–37)
CO2: 39 mEq/L — ABNORMAL HIGH (ref 19–32)
Calcium: 10.4 mg/dL (ref 8.4–10.5)
Creatinine, Ser: 0.71 mg/dL (ref 0.50–1.35)
GFR calc Af Amer: >60 mL/min (ref >60)
GFR calc non Af Amer: >60 mL/min (ref >60)
Glucose, Bld: 141 mg/dL — ABNORMAL HIGH (ref 70–99)

## 2011-06-03 MED ORDER — ACETAMINOPHEN 500 MG PO TABS: 500.0000 mg | ORAL_TABLET | ORAL | Status: DC | PRN

## 2011-06-03 MED ORDER — ALBUTEROL SULFATE (5 MG/ML) 0.5% IN NEBU
2.5000 mg | INHALATION_SOLUTION | RESPIRATORY_TRACT | Status: DC
  Administered 2011-06-03 – 2011-06-08 (×25): 2.5 mg via RESPIRATORY_TRACT
  Filled 2011-06-03 (×26): qty 0.5

## 2011-06-03 MED ORDER — IPRATROPIUM BROMIDE 0.02 % IN SOLN
0.5000 mg | RESPIRATORY_TRACT | Status: DC
  Administered 2011-06-04 (×2): 0.5 mg via RESPIRATORY_TRACT
  Filled 2011-06-03 (×3): qty 2.5

## 2011-06-03 MED ORDER — BUDESONIDE-FORMOTEROL FUMARATE 160-4.5 MCG/ACT IN AERO
2.0000 | INHALATION_SPRAY | Freq: Two times a day (BID) | RESPIRATORY_TRACT | Status: DC
  Filled 2011-06-03: qty 6

## 2011-06-03 MED ORDER — METHYLPREDNISOLONE SODIUM SUCC 125 MG IJ SOLR
125.0000 mg | Freq: Four times a day (QID) | INTRAMUSCULAR | Status: DC
  Administered 2011-06-03 – 2011-06-08 (×20): 125 mg via INTRAVENOUS
  Filled 2011-06-03 (×22): qty 2

## 2011-06-03 MED ORDER — MORPHINE SULFATE 2 MG/ML IJ SOLN
2.0000 mg | INTRAMUSCULAR | Status: DC | PRN
  Administered 2011-06-03 – 2011-06-04 (×2): 2 mg via INTRAVENOUS
  Filled 2011-06-03 (×2): qty 1

## 2011-06-03 MED ORDER — SODIUM CHLORIDE 0.9 % IV SOLN
Freq: Once | INTRAVENOUS | Status: AC
  Administered 2011-06-03: 500 mL via INTRAVENOUS

## 2011-06-03 MED ORDER — IPRATROPIUM BROMIDE 0.02 % IN SOLN
0.5000 mg | RESPIRATORY_TRACT | Status: DC
  Administered 2011-06-03 – 2011-06-08 (×26): 0.5 mg via RESPIRATORY_TRACT
  Filled 2011-06-03 (×25): qty 2.5

## 2011-06-03 MED ORDER — ALPRAZOLAM 0.5 MG PO TABS
0.5000 mg | ORAL_TABLET | Freq: Four times a day (QID) | ORAL | Status: DC | PRN
  Administered 2011-06-03 – 2011-06-05 (×3): 0.5 mg via ORAL
  Filled 2011-06-03: qty 1

## 2011-06-03 MED ORDER — HYDROCODONE-ACETAMINOPHEN 5-325 MG PO TABS
1.0000 | ORAL_TABLET | ORAL | Status: DC | PRN
  Administered 2011-06-03: 1 via ORAL
  Filled 2011-06-03: qty 1

## 2011-06-03 MED ORDER — ALPRAZOLAM 0.5 MG PO TABS
0.5000 mg | ORAL_TABLET | Freq: Four times a day (QID) | ORAL | Status: DC | PRN
  Administered 2011-06-06: 0.5 mg via ORAL
  Filled 2011-06-03 (×3): qty 1

## 2011-06-03 MED ORDER — ALBUTEROL SULFATE (5 MG/ML) 0.5% IN NEBU
2.5000 mg | INHALATION_SOLUTION | RESPIRATORY_TRACT | Status: DC
  Administered 2011-06-03 – 2011-06-04 (×3): 2.5 mg via RESPIRATORY_TRACT
  Filled 2011-06-03 (×2): qty 0.5

## 2011-06-03 MED ORDER — SODIUM CHLORIDE 0.9 % IV BOLUS (SEPSIS)
500.0000 mL | Freq: Once | INTRAVENOUS | Status: AC
  Administered 2011-06-03: 500 mL via INTRAVENOUS

## 2011-06-03 MED ORDER — ENOXAPARIN SODIUM 40 MG/0.4ML ~~LOC~~ SOLN
40.0000 mg | SUBCUTANEOUS | Status: DC
  Administered 2011-06-03 – 2011-06-07 (×5): 40 mg via SUBCUTANEOUS
  Filled 2011-06-03 (×6): qty 0.4

## 2011-06-03 MED ORDER — DEXTROSE 5 % IV SOLN
1.0000 g | INTRAVENOUS | Status: DC
  Administered 2011-06-04 – 2011-06-07 (×4): 1 g via INTRAVENOUS
  Filled 2011-06-03 (×6): qty 1

## 2011-06-03 NOTE — ED Notes (Signed)
Pt resting calmly w/ eyes closed. Rise & fall of the chest noted.  Pt remains on cardiac monitor w/ NIBP vital signs WNL. Pt provided a warm blanket. Bed in low position, side rails up x2. NAD noted & no needs voiced at this time.

## 2011-06-03 NOTE — ED Notes (Signed)
CRITICAL VALUE ALERT  Critical value received:  Ph 7.121, PCO2 122, Po2 181, bicarb 38.1  Date of notification:  06/03/11  Time of notification:  0010  Critical value read back:yes  Nurse who received alert:  Thornton Dales, RN  MD notified (1st page):  Colon Branch  Time of first page:  0010  MD notified (2nd page):  Time of second page:  Responding MD:  strand  Time MD responded:  0010

## 2011-06-03 NOTE — Consult Note (Signed)
Consult requested by: Dr. Renard Matter Consult requested for respiratory failure:  HPI: This is a 55 year old African American male who has a long known history of severe COPD. He had been in his usual state of fair health at home when he developed increasing shortness of breath over the last few days which became much worse during the night last night. He was brought to the emergency room and placed on BiPAP. He says he is having some right chest pain. He says it is not been having any fever or chills but he does have some discomfort when he takes a deep breath. He just had a CT scan done without contrast but I cannot access that yet.  Past Medical History  Diagnosis Date  . COPD (chronic obstructive pulmonary disease)   . Asthma   . Hypertension   . Anxiety    multiple bouts of respiratory failure   History reviewed. No pertinent family history. no known family history of lung disease  History   Social History  . Marital Status: Married    Spouse Name: N/A    Number of Children: N/A  . Years of Education: N/A   Social History Main Topics  . Smoking status: Current Everyday Smoker -- 1.0 packs/day for 10 years    Types: Cigarettes  . Smokeless tobacco: None  . Alcohol Use: No  . Drug Use: No  . Sexually Active:    Other Topics Concern  . None   Social History Narrative  . None     ROS: Negative except as mentioned and difficult to obtain because he is on BiPAP    Objective: Vital signs in last 24 hours: Temp:  [97.4 F (36.3 C)-98 F (36.7 C)] 97.8 F (36.6 C) (08/30 1611) Pulse Rate:  [96-122] 115  (08/30 1611) Resp:  [15-28] 19  (08/30 1611) BP: (125-168)/(40-93) 151/93 mmHg (08/30 1611) SpO2:  [95 %-100 %] 95 % (08/30 1611) FiO2 (%):  [30 %-40 %] 30 % (08/30 0422) Weight:  [86.183 kg (190 lb)] 190 lb (86.183 kg) (08/29 2223) Weight change:     Intake/Output from previous day:    PHYSICAL EXAM He is on BiPAP. He still is to have some distress. His pupils  are reactive to light and accommodation. Mucous membranes appear to be dry. His chest shows he still has some wheeze but he is in general rather "tight. His heart is regular with distant heart sounds. His abdomen is soft without masses his bowel sounds are present and active. His extremities showed no clubbing cyanosis or edema. His central nervous system examination is grossly intact.  Lab Results: Basic Metabolic Panel:  Basename 06/03/11 0002  NA 136  K 4.9  CL 92*  CO2 39*  GLUCOSE 141*  BUN 8  CREATININE 0.71  CALCIUM 10.4  MG --  PHOS --   Liver Function Tests:  Basename 06/03/11 0002  AST 13  ALT 8  ALKPHOS 111  BILITOT 0.2*  PROT 8.3  ALBUMIN 4.0   No results found for this basename: LIPASE:2,AMYLASE:2 in the last 72 hours No results found for this basename: AMMONIA:2 in the last 72 hours CBC:  Basename 06/03/11 0002  WBC 13.1*  NEUTROABS 11.5*  HGB 15.0  HCT 47.9  MCV 94.5  PLT 263   Cardiac Enzymes: No results found for this basename: CKTOTAL:3,CKMB:3,CKMBINDEX:3,TROPONINI:3 in the last 72 hours BNP: No results found for this basename: POCBNP:3 in the last 72 hours D-Dimer: No results found for this basename: DDIMER:2 in  the last 72 hours CBG: No results found for this basename: GLUCAP:6 in the last 72 hours Hemoglobin A1C: No results found for this basename: HGBA1C in the last 72 hours Fasting Lipid Panel: No results found for this basename: CHOL,HDL,LDLCALC,TRIG,CHOLHDL,LDLDIRECT in the last 72 hours Thyroid Function Tests: No results found for this basename: TSH,T4TOTAL,FREET4,T3FREE,THYROIDAB in the last 72 hours Anemia Panel: No results found for this basename: VITAMINB12,FOLATE,FERRITIN,TIBC,IRON,RETICCTPCT in the last 72 hours Urine Drug Screen:  Alcohol Level: No results found for this basename: ETH:2 in the last 72 hours Urinalysis:  Misc. Labs:   ABGS:  Basename 06/03/11 1735  PHART 7.327*  PO2ART 58.2*  TCO2 31.6  HCO3 35.1*       MICROBIOLOGY: Recent Results (from the past 240 hour(s))  MRSA PCR SCREENING     Status: Normal   Collection Time   06/03/11  8:07 AM      Component Value Range Status Comment   MRSA by PCR NEGATIVE  NEGATIVE  Final     Studies/Results: Dg Chest Portable 1 View  06/02/2011  *RADIOLOGY REPORT*  Clinical Data: Shortness of breath, history of COPD.  PORTABLE CHEST - 1 VIEW  Comparison: 04/09/2011  Findings: Coarse interstitial prominence of the right greater than left lung. No focal consolidation, pleural effusion, or pneumothorax. Left upper lung nodular opacity is unchanged.  Stable cardiomediastinal contours.  No acute osseous abnormality.  IMPRESSION: Increased interstitial prominence without focal consolidation.  Nodular opacity left upper lung again warrants a non emergent follow-up chest CT.  Original Report Authenticated By: Waneta Martins, M.D.    Medications:  Prior to Admission:  Prescriptions prior to admission  Medication Sig Dispense Refill  . albuterol (PROAIR HFA) 108 (90 BASE) MCG/ACT inhaler Inhale 2 puffs into the lungs every 6 (six) hours as needed.        [name] albuterol (PROVENTIL) (2.5 MG/3ML) 0.083% nebulizer solution Take 2.5 mg by nebulization every 6 (six) hours as needed. Shortness of breath       . ALPRAZolam (XANAX) 0.5 MG tablet Take 0.5 mg by mouth every 4 (four) hours as needed. For nerves      . budesonide-formoterol (SYMBICORT) 160-4.5 MCG/ACT inhaler Inhale 2 puffs into the lungs 2 (two) times daily.        . cetirizine (ZYRTEC) 5 MG tablet Take 5 mg by mouth daily.        . fexofenadine-pseudoephedrine (ALLEGRA-D 24) 180-240 MG per 24 hr tablet Take 1 tablet by mouth daily.        [name] guaiFENesin (MUCINEX) 600 MG 12 hr tablet Take 1,200 mg by mouth 2 (two) times daily as needed. congestion       . HYDROcodone-homatropine (HYCODAN) 5-1.5 MG/5ML syrup Take 5 mLs by mouth every 4 (four) hours as needed. pain       . levofloxacin (LEVAQUIN) 500 MG tablet  Take 500 mg by mouth daily. For 7 days       . metFORMIN (GLUCOPHAGE) 500 MG tablet Take 1,000 mg by mouth 2 (two) times daily with a meal.        . acetaminophen (TYLENOL) 80 MG chewable tablet Chew 80 mg by mouth every 4 (four) hours as needed.        [name] albuterol (PROVENTIL HFA;VENTOLIN HFA) 108 (90 BASE) MCG/ACT inhaler Inhale 1 puff into the lungs every 6 (six) hours as needed. Shortness of breath       . Azelastine HCl (ASTEPRO) 0.15 % SOLN Place 1 spray into the nose as  directed.        [name] guanFACINE (INTUNIV) 2 MG TB24 Take 2 mg by mouth daily.        [name] ibuprofen (ADVIL,MOTRIN) 200 MG tablet Take 400 mg by mouth every 6 (six) hours as needed. inflammation       . ibuprofen (ADVIL,MOTRIN) 800 MG tablet Take 800 mg by mouth.        . montelukast (SINGULAIR) 10 MG tablet Take 10 mg by mouth at bedtime.        . predniSONE (DELTASONE) 20 MG tablet Take 20 mg by mouth daily.        . simvastatin (ZOCOR) 40 MG tablet Take 40 mg by mouth at bedtime.         Scheduled:   . sodium chloride   Intravenous Once  . albuterol  2.5 mg Nebulization Once  . budesonide-formoterol  2 puff Inhalation BID  . cefTRIAXone (ROCEPHIN) IVPB 1 gram/50 mL D5W  1 g Intravenous Q24H  . ipratropium  0.5 mg Nebulization Once  . methylPREDNISolone (SOLU-MEDROL) injection  125 mg Intravenous Q6H  . sodium chloride  500 mL Intravenous Once   Continuous:  ZOX:[id number], ALPRAZolam, morphine injection, DISCONTD: HYDROcodone-acetaminophen  Assesment: He has acute respiratory failure which by the last blood gases remarkably better. He still somewhat hypoxic. He has severe COPD and he does continue to smoke cigarettes. He has some problems with anxiety as well. He has hypertension which is relatively well controlled Active Problems:  * No active hospital problems. *     Plan: He needs to be on antibiotics steroids inhaled bronchodilators and BiPAP. He'll have repeat laboratory work etc. tomorrow.    LOS: 1 day     HAWKINS,EDWARD L 06/03/2011, 6:18 PM

## 2011-06-03 NOTE — Consult Note (Signed)
0865 Call to Dr. Sudie Bailey for admission. 0500 Spoke with Dr. Sudie Bailey who will admit patient to step down bed. Patient;s PCP is Dr. Renard Matter.

## 2011-06-03 NOTE — H&P (Signed)
NAME:  Jeff Ray, Jeff Ray                 ACCOUNT NO.:  [id number]  MEDICAL RECORD NO.:  [id number]  LOCATION:  IC03                          FACILITY:  [facility]  PHYSICIAN:  Angus G. Renard Matter, MD   DATE OF BIRTH:  02/15/1956  DATE OF ADMISSION:  06/02/2011 DATE OF DISCHARGE:  LH                             HISTORY & PHYSICAL   HISTORY OF PRESENT ILLNESS:  A 55 year old Afro-American male presented to the ED with shortness of breath.  The patient does have significant COPD and increased shortness of breath with wheezing and orthopnea and he was given Solu-Medrol nebulizer treatment en route to the hospital, continued to be short of breath and he had O2 sat 98 to 100% on admission to the emergency department.  The patient's problem started on day of admission with gradual worsening of condition.  The patient had other symptoms, orthopnea, shortness of breath, and wheezing.  SOCIAL HISTORY:  The patient does not smoke or drink alcohol.  PAST MEDICAL HISTORY:  COPD, asthma, hypertension, and anxiety.  PAST SURGICAL HISTORY:  Previous history of back surgery.  He has no pertinent family history.  The patient does continue use of cigarettes, smokes daily, had been smoking for approximately 10 years.  REVIEW OF SYSTEMS:  HEENT:  Negative.  CARDIOPULMONARY:  The patient has shortness of breath and wheezing but has not been coughing.  The patient has had orthopnea.  GI:  No bowel irregularity or bleeding.  GU:  No dysuria pr hematuria.  PHYSICAL EXAMINATION:  VITAL SIGNS:  Blood pressure 139/93, pulse 117, temperature 97.5, and respirations 21. GENERAL:  At the time of my exam, the patient was complaining of right- sided chest pain. HEENT:  Eyes: PERRLA.  TMs negative.  Oropharynx benign. NECK:  Supple.  No JVD or thyroid abnormalities. HEART:  Regular rhythm with sinus tachycardia. LUNGS:  Tachypneic with bilateral rhonchi and wheezes with decreased breath sounds bilaterally.  Use of  accessory muscle for respiration. ABDOMEN:  Soft and nontender.  No palpable organs or masses. MUSCULOSKELETAL:  Normal range of motion. NEUROLOGICAL:  The patient is alert, oriented.  No motor or sensory problems with extremities. SKIN:  Warm and dry.  LABORATORY FINDINGS:  Pertinent lab studies WBC on admission 13,100 with hemoglobin of 15.0, hematocrit 47.9, 88 neutrophils, 11.5.  absolute neutrophils, 9 lymphocytes.  The patient's chemistries, sodium 136, potassium 4.9, chloride 92, CO2 of 39, glucose 141, BUN 8, creatinine 0.71, calcium 10.4, total protein 8.3, albumin 4.0, AST 13, ALT 8. Blood gases on admission pH was 7.121 with a pCO2 of 122, pO2 of 181, bicarbonate of 38.1.  CO2 of 36.1, acid base 8.9.  Subsequent blood gases, chronic bilevel positive airway pressure, pH 7.189, pCO2 of 92.8, pO2 of 73.1, bicarbonate 434, pCO2 of 31.4, acid base 6.1.  Chest x-ray: No significant infection but evidence of COPD.  The patient improved with BiPAP in the emergency department and subsequently he was admitted to ICU.  ASSESSMENT:  The patient does have known chronic obstructive pulmonary disease.  He has coarse interstitial prominence of the right greater than left lung, left upper nodule opacity unchanged, hypercapnia acidosis, the patient being admitted to inpatient  bed for continued treatment.     Angus G. Renard Matter, MD     AGM/MEDQ  D:  06/03/2011  T:  06/03/2011  Job:  [id number]

## 2011-06-03 NOTE — ED Notes (Signed)
Pt resting calmly w/ eyes closed. Rise & fall of the chest noted.  Pt remains on cardiac monitor w/ NIBP vital signs WNL. NAD noted. No needs voiced at this time.  

## 2011-06-03 NOTE — ED Notes (Addendum)
CRITICAL VALUE ALERT  Critical value received:  Ph 7.189:pco2 92.8;po2 -73.1. bicar 34 Date of notification:  06/03/11 Time of notification:  0415  Critical value read back:urse who received alert:  P.[email or web address],rn MD notified (1st page):   Time of first page:    MD notified (2nd page):  Time of second page:  Responding MD:  strandTime MD responded:  [phone]

## 2011-06-03 NOTE — ED Provider Notes (Signed)
History     CSN: [id number] Arrival date & time: 06/02/2011 10:29 PM  Chief Complaint  Patient presents with  . Shortness of Breath   HPI Comments: Seen 2323. Patient with significant COPD, here with increased SOB, wheezing, orthopnea. Given solumedrol and nebulized treatments x 2 en route. Continues to be short of breath. HR 122, O2 sat 98-100% on non rebreather.h/o recent hospitalization with prolonged use of BIPAP. Currently on antibiotics for pna. Denies fever, chills. Has chest pain with breathing. Continues to smoke.  Patient is a 55 y.o. male presenting with shortness of breath. The history is provided by the patient and the EMS personnel.  Shortness of Breath  The current episode started today (Patient weith significant COPD with incrased SPB, wheezing today.). The problem has been gradually worsening. The problem is severe. The symptoms are relieved by nothing. The symptoms are aggravated by nothing. Associated symptoms include orthopnea, shortness of breath and wheezing. Pertinent negatives include no fever, no rhinorrhea, no sore throat, no stridor and no cough. His past medical history is significant for asthma and past wheezing. Past medical history comments: COPD, recent hospitalization, BIPAP. Recently, medical care has been given at this facility (Dx with early pna, currently on antibiotics (Levaquin)).    Past Medical History  Diagnosis Date  . COPD (chronic obstructive pulmonary disease)   . Asthma   . Hypertension   . Anxiety     Past Surgical History  Procedure Date  . Back surgery     History reviewed. No pertinent family history.  History  Substance Use Topics  . Smoking status: Current Everyday Smoker -- 1.0 packs/day for 10 years    Types: Cigarettes  . Smokeless tobacco: Not on file  . Alcohol Use: No      Review of Systems  Constitutional: Positive for fatigue. Negative for fever.  HENT: Negative for sore throat and rhinorrhea.   Respiratory:  Positive for shortness of breath and wheezing. Negative for cough and stridor.   Cardiovascular: Positive for orthopnea.  All other systems reviewed and are negative.    Physical Exam  BP 139/93  Pulse 117  Temp(Src) 97.5 F (36.4 C) (Oral)  Resp 21  Ht 6\' 1"  (1.854 m)  Wt 190 lb (86.183 kg)  BMI 25.07 kg/m2  SpO2 98%  Physical Exam  Nursing note and vitals reviewed. Constitutional: He is oriented to person, place, and time. He appears well-developed and well-nourished. He appears distressed.       In respiratory distress.  HENT:  Head: Normocephalic and atraumatic.  Eyes: EOM are normal.  Neck: Normal range of motion. Neck supple.  Cardiovascular: Normal rate.        tachycardia  Pulmonary/Chest: Accessory muscle usage present. Tachypnea noted. He is in respiratory distress. He has wheezes.       Poor air movement, diffuse wheezing throughout, tachypnea, acute respiratory distress, use of accessory muscles, prolonged expirations  Abdominal: Soft.  Musculoskeletal: Normal range of motion.  Neurological: He is alert and oriented to person, place, and time.  Skin: Skin is warm and dry.    ED Course  Procedures  Results for orders placed during the hospital encounter of 06/02/11  CBC      Component Value Range   WBC 13.1 (*) 4.0 - 10.5 (K/uL)   RBC 5.07  4.22 - 5.81 (MIL/uL)   Hemoglobin 15.0  13.0 - 17.0 (g/dL)   HCT 40.9  81.1 - 91.4 (%)   MCV 94.5  78.0 - 100.0 (fL)  MCH 29.6  26.0 - 34.0 (pg)   MCHC 31.3  30.0 - 36.0 (g/dL)   RDW 21.3  08.6 - 57.8 (%)   Platelets 263  150 - 400 (K/uL)  DIFFERENTIAL      Component Value Range   Neutrophils Relative 88 (*) 43 - 77 (%)   Neutro Abs 11.5 (*) 1.7 - 7.7 (K/uL)   Lymphocytes Relative 9 (*) 12 - 46 (%)   Lymphs Abs 1.2  0.7 - 4.0 (K/uL)   Monocytes Relative 3  3 - 12 (%)   Monocytes Absolute 0.4  0.1 - 1.0 (K/uL)   Eosinophils Relative 1  0 - 5 (%)   Eosinophils Absolute 0.1  0.0 - 0.7 (K/uL)   Basophils Relative  0  0 - 1 (%)   Basophils Absolute 0.0  0.0 - 0.1 (K/uL)  COMPREHENSIVE METABOLIC PANEL      Component Value Range   Sodium 136  135 - 145 (mEq/L)   Potassium 4.9  3.5 - 5.1 (mEq/L)   Chloride 92 (*) 96 - 112 (mEq/L)   CO2 39 (*) 19 - 32 (mEq/L)   Glucose, Bld 141 (*) 70 - 99 (mg/dL)   BUN 8  6 - 23 (mg/dL)   Creatinine, Ser 4.69  0.50 - 1.35 (mg/dL)   Calcium 62.9  8.4 - 10.5 (mg/dL)   Total Protein 8.3  6.0 - 8.3 (g/dL)   Albumin 4.0  3.5 - 5.2 (g/dL)   AST 13  0 - 37 (U/L)   ALT 8  0 - 53 (U/L)   Alkaline Phosphatase 111  39 - 117 (U/L)   Total Bilirubin 0.2 (*) 0.3 - 1.2 (mg/dL)   GFR calc non Af Amer >60  >60 (mL/min)   GFR calc Af Amer >60  >60 (mL/min)  BLOOD GAS, ARTERIAL      Component Value Range   O2 Content 2.0     Delivery systems NASAL CANNULA     pH, Arterial 7.121 (*) 7.350 - 7.450    pCO2 arterial 122.0 (*) 35.0 - 45.0 (mmHg)   pO2, Arterial 181.0 (*) 80.0 - 100.0 (mmHg)   Bicarbonate 38.1 (*) 20.0 - 24.0 (mEq/L)   TCO2 36.1  0 - 100 (mmol/L)   Acid-Base Excess 8.9 (*) 0.0 - 2.0 (mmol/L)   O2 Saturation 98.8     Patient temperature 37.0     Collection site RIGHT BRACHIAL     Drawn by COLLECTED BY RT     Sample type ARTERIAL     Allens test (pass/fail) NOT INDICATED (*) PASS   BLOOD GAS, ARTERIAL      Component Value Range   FIO2 .30     Delivery systems BILEVEL POSITIVE AIRWAY PRESSURE     Inspiratory PAP 16.0     Expiratory PAP 8.0     pH, Arterial 7.189 (*) 7.350 - 7.450    pCO2 arterial 92.8 (*) 35.0 - 45.0 (mmHg)   pO2, Arterial 73.1 (*) 80.0 - 100.0 (mmHg)   Bicarbonate 34.0 (*) 20.0 - 24.0 (mEq/L)   TCO2 31.4  0 - 100 (mmol/L)   Acid-Base Excess 6.1 (*) 0.0 - 2.0 (mmol/L)   O2 Saturation 93.7     Patient temperature 37.0     Collection site RIGHT BRACHIAL     Drawn by COLLECTED BY RT     Sample type ARTERIAL     Allens test (pass/fail) NOT INDICATED (*) PASS       Dg Chest Portable 1 View  06/02/2011  *RADIOLOGY REPORT*  Clinical Data:  Shortness of breath, history of COPD.  PORTABLE CHEST - 1 VIEW  Comparison: 04/09/2011  Findings: Coarse interstitial prominence of the right greater than left lung. No focal consolidation, pleural effusion, or pneumothorax. Left upper lung nodular opacity is unchanged.  Stable cardiomediastinal contours.  No acute osseous abnormality.  IMPRESSION: Increased interstitial prominence without focal consolidation.  Nodular opacity left upper lung again warrants a non emergent follow-up chest CT.  Original Report Authenticated By: Waneta Martins, M.D.   MDM Patient with long h/o COPD., asthma who continues to smoke. Recently hospitalizied for SOB requiring prolonged treatment with BIPAP. Preseneted short of breath. ABGs with significant hypercapnea and acidosis. Placed on BiPAP with repeat BIPAP showing improvement in ABG. Patient able to talk in complete sentences with 2 hours of BIPAP. Chest xray without significant evidence for infection. Patient remains on PO antibiotics (Levaquin). With respiratory resusitation, patient has improved in the ER. Admission to inpatient bed  for continued treatment. Patient  informed of clinical course, understand medical decision-making process, and agree with plan. Pt stable in ED with no significant deterioration in condition.  MDM Reviewed: nursing note and vitals Reviewed previous: labs, ECG and x-ray Interpretation: labs and x-ray Total time providing critical care: 30-74 minutes. This excludes time spent performing separately reportable procedures and services. Consults: primary care provider     Nicoletta Dress. Colon Branch, MD 06/03/11 [phone]

## 2011-06-04 ENCOUNTER — Inpatient Hospital Stay (HOSPITAL_COMMUNITY): Payer: Medicare HMO

## 2011-06-04 LAB — BLOOD GAS, ARTERIAL
Acid-Base Excess: 8.8 mmol/L — ABNORMAL HIGH (ref 0.0–2.0)
Bicarbonate: 36.5 mEq/L — ABNORMAL HIGH (ref 20.0–24.0)
Delivery systems: POSITIVE
TCO2: 33.1 mmol/L (ref 0–100)
pCO2 arterial: 68 mmHg (ref 35.0–45.0)

## 2011-06-04 LAB — GLUCOSE, CAPILLARY: Glucose-Capillary: 216 mg/dL — ABNORMAL HIGH (ref 70–99)

## 2011-06-04 LAB — CBC
Hemoglobin: 12.5 g/dL — ABNORMAL LOW (ref 13.0–17.0)
MCH: 29.1 pg (ref 26.0–34.0)
MCHC: 31.3 g/dL (ref 30.0–36.0)
Platelets: 233 10*3/uL (ref 150–400)

## 2011-06-04 LAB — BASIC METABOLIC PANEL
BUN: 11 mg/dL (ref 6–23)
CO2: 38 mEq/L — ABNORMAL HIGH (ref 19–32)
Chloride: 92 mEq/L — ABNORMAL LOW (ref 96–112)
GFR calc non Af Amer: >60 mL/min (ref >60)
Glucose, Bld: 194 mg/dL — ABNORMAL HIGH (ref 70–99)
Potassium: 4.6 mEq/L (ref 3.5–5.1)

## 2011-06-04 LAB — DIFFERENTIAL
Basophils Relative: 0 % (ref 0–1)
Eosinophils Absolute: 0 10*3/uL (ref 0.0–0.7)
Monocytes Relative: 6 % (ref 3–12)
Neutrophils Relative %: 89 % — ABNORMAL HIGH (ref 43–77)

## 2011-06-04 MED ORDER — INSULIN ASPART 100 UNIT/ML ~~LOC~~ SOLN
0.0000 [IU] | Freq: Every day | SUBCUTANEOUS | Status: DC
  Administered 2011-06-04: 1 [IU] via SUBCUTANEOUS
  Administered 2011-06-05 – 2011-06-06 (×2): 5 [IU] via SUBCUTANEOUS
  Administered 2011-06-07: 2 [IU] via SUBCUTANEOUS
  Filled 2011-06-04: qty 3

## 2011-06-04 MED ORDER — INSULIN ASPART 100 UNIT/ML ~~LOC~~ SOLN
0.0000 [IU] | Freq: Three times a day (TID) | SUBCUTANEOUS | Status: DC
  Administered 2011-06-05: 3 [IU] via SUBCUTANEOUS
  Administered 2011-06-05 (×2): 2 [IU] via SUBCUTANEOUS
  Administered 2011-06-06: 3 [IU] via SUBCUTANEOUS
  Administered 2011-06-06: 7 [IU] via SUBCUTANEOUS
  Administered 2011-06-06: 3 [IU] via SUBCUTANEOUS
  Administered 2011-06-07: 1 [IU] via SUBCUTANEOUS
  Administered 2011-06-07: 3 [IU] via SUBCUTANEOUS
  Administered 2011-06-07: 5 [IU] via SUBCUTANEOUS

## 2011-06-04 NOTE — Progress Notes (Signed)
Subjective: He looks more comfortable this morning although he says he still feels like his chest is tight. He has not had any other new complaints.  Objective: Vital signs in last 24 hours: Temp:  [97.4 F (36.3 C)-97.9 F (36.6 C)] 97.4 F (36.3 C) (08/31 0400) Pulse Rate:  [98-117] 100  (08/31 0600) Resp:  [13-24] 14  (08/31 0600) BP: (110-153)/(75-95) 145/89 mmHg (08/31 0600) SpO2:  [92 %-100 %] 98 % (08/31 0600) FiO2 (%):  [30 %] 30 % (08/31 0403) Weight:  [79.8 kg (175 lb 14.8 oz)] 175 lb 14.8 oz (79.8 kg) (08/31 0400) Weight change: -6.384 kg (-14 lb 1.2 oz) Last BM Date: 06/01/11  Intake/Output from previous day: 08/30 0701 - 08/31 0700 In: 1928.8 [I.V.:1928.8] Out: 400 [Urine:400]  PHYSICAL EXAM General appearance: alert, cooperative and moderate distress Resp: wheezes bilaterally Cardio: regular rate and rhythm, S1, S2 normal, no murmur, click, rub or gallop GI: soft, non-tender; bowel sounds normal; no masses,  no organomegaly Extremities: extremities normal, atraumatic, no cyanosis or edema  Lab Results:    Basic Metabolic Panel:  Basename 06/04/11 0455 06/03/11 0002  NA 134* 136  K 4.6 4.9  CL 92* 92*  CO2 38* 39*  GLUCOSE 194* 141*  BUN 11 8  CREATININE 0.57 0.71  CALCIUM 10.2 10.4  MG -- --  PHOS -- --   Liver Function Tests:  Basename 06/03/11 0002  AST 13  ALT 8  ALKPHOS 111  BILITOT 0.2*  PROT 8.3  ALBUMIN 4.0   No results found for this basename: LIPASE:2,AMYLASE:2 in the last 72 hours No results found for this basename: AMMONIA:2 in the last 72 hours CBC:  Basename 06/04/11 0455 06/03/11 0002  WBC 17.3* 13.1*  NEUTROABS 15.5* 11.5*  HGB 12.5* 15.0  HCT 39.9 47.9  MCV 92.8 94.5  PLT 233 263   Cardiac Enzymes: No results found for this basename: CKTOTAL:3,CKMB:3,CKMBINDEX:3,TROPONINI:3 in the last 72 hours BNP: No results found for this basename: POCBNP:3 in the last 72 hours D-Dimer: No results found for this basename:  DDIMER:2 in the last 72 hours CBG: No results found for this basename: GLUCAP:6 in the last 72 hours Hemoglobin A1C: No results found for this basename: HGBA1C in the last 72 hours Fasting Lipid Panel: No results found for this basename: CHOL,HDL,LDLCALC,TRIG,CHOLHDL,LDLDIRECT in the last 72 hours Thyroid Function Tests: No results found for this basename: TSH,T4TOTAL,FREET4,T3FREE,THYROIDAB in the last 72 hours Anemia Panel: No results found for this basename: VITAMINB12,FOLATE,FERRITIN,TIBC,IRON,RETICCTPCT in the last 72 hours Urine Drug Screen:  Alcohol Level: No results found for this basename: ETH:2 in the last 72 hours Urinalysis:  Misc. Labs:  ABGS  Basename 06/04/11 0415  PHART 7.349*  PO2ART 65.2*  TCO2 33.1  HCO3 36.5*   CULTURES Recent Results (from the past 240 hour(s))  MRSA PCR SCREENING     Status: Normal   Collection Time   06/03/11  8:07 AM      Component Value Range Status Comment   MRSA by PCR NEGATIVE  NEGATIVE  Final    Studies/Results: Ct Chest Wo Contrast  06/03/2011  *RADIOLOGY REPORT*  Clinical Data: Shortness of breath, right-sided chest pain, nodule on chest radiograph  CT CHEST WITHOUT CONTRAST  Technique:  Multidetector CT imaging of the chest was performed following the standard protocol without IV contrast.  Comparison: Chest radiograph dated 06/02/2011  Findings: Visualized thyroid is unremarkable.  Severe paraseptal and centrilobular emphysematous changes. Subpleural reticulation. Right lower lobe atelectasis.  No pulmonary opacity to  correspond to the left upper lobe radiographic abnormality, which may be osseous.  No pleural effusion or pneumothorax.  Heart is normal in size.  No pericardial effusion.  Coronary atherosclerosis.  Atherosclerotic calcifications of the aortic arch.  No suspicious mediastinal or axillary lymphadenopathy.  Visualized upper abdomen is notable for a tiny probable hepatic cyst (series 2/image 60).  Mild degenerative  changes of the visualized thoracolumbar spine.  IMPRESSION: No pulmonary opacity to correspond to the left upper lobe radiographic abnormality, which may be osseous.  Severe emphysematous changes.  Original Report Authenticated By: Charline Bills, M.D.   Dg Chest Portable 1 View  06/02/2011  *RADIOLOGY REPORT*  Clinical Data: Shortness of breath, history of COPD.  PORTABLE CHEST - 1 VIEW  Comparison: 04/09/2011  Findings: Coarse interstitial prominence of the right greater than left lung. No focal consolidation, pleural effusion, or pneumothorax. Left upper lung nodular opacity is unchanged.  Stable cardiomediastinal contours.  No acute osseous abnormality.  IMPRESSION: Increased interstitial prominence without focal consolidation.  Nodular opacity left upper lung again warrants a non emergent follow-up chest CT.  Original Report Authenticated By: Waneta Martins, M.D.    Medications:  Scheduled:   . ipratropium  0.5 mg Nebulization Q4H   And  . albuterol  2.5 mg Nebulization Q4H  . ipratropium  0.5 mg Nebulization Q4H   And  . albuterol  2.5 mg Nebulization Q4H  . budesonide-formoterol  2 puff Inhalation BID  . budesonide-formoterol  2 puff Inhalation BID  . cefTRIAXone (ROCEPHIN) IVPB 1 gram/50 mL D5W  1 g Intravenous Q24H  . enoxaparin  40 mg Subcutaneous Q24H  . methylPREDNISolone (SOLU-MEDROL) injection  125 mg Intravenous Q6H   Continuous:  ZOX:[id number], ALPRAZolam, ALPRAZolam, morphine injection, DISCONTD: HYDROcodone-acetaminophen  Assesment: He has COPD with acute exacerbation. He had acute on chronic respiratory failure. He has been maintained on BiPAP. He says he feels better and he was attempted to come off the BiPAP while he was doing and nebulizer treatment he said he still felt too tight to leave it off. Active Problems:  * No active hospital problems. *     Plan: Continue with BiPAP support as needed continue with steroids antibiotics etc. I will be gone  September 1, September 2 in September 3    LOS: 2 days   Jeff Ray 06/04/2011, 7:29 AM

## 2011-06-04 NOTE — Progress Notes (Signed)
UR Chart Review Completed  

## 2011-06-04 NOTE — Progress Notes (Signed)
NAME:  Jeff Ray, Jeff Ray                 ACCOUNT NO.:  [id number]  MEDICAL RECORD NO.:  [id number]  LOCATION:                                 FACILITY:  PHYSICIAN:  Talar Fraley G. Renard Matter, MD   DATE OF BIRTH:  28-Jul-1956  DATE OF PROCEDURE: DATE OF DISCHARGE:                                PROGRESS NOTE   This patient has end-stage COPD, was admitted with respiratory distress and hypoxia.  He is feeling some better.  He was complaining of right- sided chest pain last evening, was placed on IV morphine.  CT of the chest without contrast showed no pulmonary opacity to correspond his left upper lobe radiographic abnormality, which may be osseous.  OBJECTIVE:  The patient remains on BiPAP. VITAL SIGNS:  Blood pressure 132/82, respirations 17, pulse 98, temperature 97.4.  Most recent blood gases showed a pH of 7.349 with pCO2 68, pO2 65. LUNGS:  Diminished breath sounds. HEART:  Regular rhythm. ABDOMEN:  No palpable organs or masses.  Chemistries remained normal.  ASSESSMENT:  The patient has end-stage chronic obstructive pulmonary disease and asthma, and was admitted with hypoxia and hypercapnia.  We will plan to continue current IV antibiotics, Solu-Medrol, neb treatments and NovoLog insulin.  The patient remains on BiPAP.  The patient will be seen by Dr. Juanetta Gosling and Pulmonology.     Khaleem Burchill G. Renard Matter, MD     AGM/MEDQ  D:  06/04/2011  T:  06/04/2011  Job:  [id number]

## 2011-06-05 LAB — GLUCOSE, CAPILLARY
Glucose-Capillary: 157 mg/dL — ABNORMAL HIGH (ref 70–99)
Glucose-Capillary: 215 mg/dL — ABNORMAL HIGH (ref 70–99)
Glucose-Capillary: 353 mg/dL — ABNORMAL HIGH (ref 70–99)

## 2011-06-05 LAB — BLOOD GAS, ARTERIAL
Acid-Base Excess: 13 mmol/L — ABNORMAL HIGH (ref 0.0–2.0)
Bicarbonate: 38.5 mEq/L — ABNORMAL HIGH (ref 20.0–24.0)
O2 Saturation: 95.4 %
Patient temperature: 37
TCO2: 34.7 mmol/L (ref 0–100)
pO2, Arterial: 76.2 mmHg — ABNORMAL LOW (ref 80.0–100.0)

## 2011-06-05 MED ORDER — HYDROCODONE-ACETAMINOPHEN 5-325 MG PO TABS
1.0000 | ORAL_TABLET | Freq: Four times a day (QID) | ORAL | Status: DC | PRN
  Administered 2011-06-05 – 2011-06-06 (×2): 1 via ORAL
  Filled 2011-06-05 (×2): qty 1

## 2011-06-05 MED ORDER — SODIUM CHLORIDE 0.9 % IJ SOLN
INTRAMUSCULAR | Status: AC
  Administered 2011-06-05: 10 mL
  Filled 2011-06-05: qty 10

## 2011-06-05 NOTE — Progress Notes (Signed)
NAME:  Jeff Ray, Jeff Ray                 ACCOUNT NO.:  [id number]  MEDICAL RECORD NO.:  [id number]  LOCATION:  IC03                          FACILITY:  [facility]  PHYSICIAN:  Angus G. Renard Matter, MD   DATE OF BIRTH:  1956/08/02  DATE OF PROCEDURE: DATE OF DISCHARGE:                                PROGRESS NOTE   This patient has end-stage COPD, was admitted with respiratory distress and hypoxia.  He is feeling better with less chest pain.  He is now on nasal cannula.  Most recent blood gases showed a pH of 7.3827 with a pCO2 of 68.9, pO2 58.2.  Most recent white blood count 17,300.  Another blood gas on August 31, pH 7.349 with a pCO2 of 68, pO2 65.2.  Blood sugars ranged from 157-261.  OBJECTIVE:  VITAL SIGNS:  Blood pressure 145/73, respiration 24, pulse 106, temperature 97.7. LUNGS:  Diminished breath sounds, occasional rhonchus heard. HEART:  Regular rhythm. ABDOMEN:  No palpable organs or masses.  ASSESSMENT:  The patient does have end-stage chronic obstructive pulmonary disease and asthma, was admitted with hypoxia and hypercapnia. He seems to be gradually improving.  PLAN:  To continue IV antibiotics, Solu-Medrol, nebulized treatments, and NovoLog insulin.  Continue current regimen.     Angus G. Renard Matter, MD     AGM/MEDQ  D:  06/05/2011  T:  06/05/2011  Job:  [id number]

## 2011-06-06 LAB — GLUCOSE, CAPILLARY
Glucose-Capillary: 242 mg/dL — ABNORMAL HIGH (ref 70–99)
Glucose-Capillary: 242 mg/dL — ABNORMAL HIGH (ref 70–99)

## 2011-06-06 MED ORDER — SALINE SPRAY 0.65 % NA SOLN
NASAL | Status: AC
  Filled 2011-06-06: qty 44

## 2011-06-06 MED ORDER — SALINE SPRAY 0.65 % NA SOLN
1.0000 | NASAL | Status: DC | PRN
  Filled 2011-06-06: qty 44

## 2011-06-06 MED ORDER — METFORMIN HCL 500 MG PO TABS
1000.0000 mg | ORAL_TABLET | Freq: Two times a day (BID) | ORAL | Status: DC
  Administered 2011-06-06 – 2011-06-08 (×4): 1000 mg via ORAL
  Filled 2011-06-06: qty 2
  Filled 2011-06-06 (×2): qty 1
  Filled 2011-06-06: qty 2
  Filled 2011-06-06 (×2): qty 1

## 2011-06-06 NOTE — Progress Notes (Signed)
NAME:  Jeff, Ray                 ACCOUNT NO.:  [id number]  MEDICAL RECORD NO.:  [id number]  LOCATION:  IC03                          FACILITY:  [facility]  PHYSICIAN:  Dejour Vos D. Felecia Shelling, MD   DATE OF BIRTH:  1956/04/29  DATE OF PROCEDURE:  06/06/2011 DATE OF DISCHARGE:                                PROGRESS NOTE   SUBJECTIVE:  The patient feels slightly better.  Still, he has wheezing and cough.  No fever or chills.  PHYSICAL EXAMINATION:  VITAL SIGNS:  Blood pressure 108/94, pulse 72, respiratory rate 14, temperature 97.6 degrees Fahrenheit. CHEST:  Decreased air entry bilaterally.  Expiratory wheezes and rhonchi. CARDIOVASCULAR SYSTEM:  First and second heart sounds heard.  No murmur. No gallop. ABDOMEN:  Soft and lax.  Bowel sound is positive.  No mass or organomegaly. EXTREMITIES:  No leg edema.  ASSESSMENT: 1. Chronic obstructive pulmonary disease with exacerbation. 2. Steroid-induced hyperglycemia. 3. Hypertension. 4. Deconditioning.  PLAN:  Continue the patient on IV steroids.  We will continue insulin coverage.  Continue IV antibiotics.  Continue current treatment and supportive care.     Elmer Boutelle D. Felecia Shelling, MD     TDF/MEDQ  D:  06/06/2011  T:  06/06/2011  Job:  [id number]

## 2011-06-07 ENCOUNTER — Inpatient Hospital Stay (HOSPITAL_COMMUNITY): Payer: Medicare HMO

## 2011-06-07 LAB — BLOOD GAS, ARTERIAL
O2 Content: 3 L/min
pCO2 arterial: 58.1 mmHg (ref 35.0–45.0)
pH, Arterial: 7.393 (ref 7.350–7.450)
pO2, Arterial: 91.2 mmHg (ref 80.0–100.0)

## 2011-06-07 LAB — GLUCOSE, CAPILLARY
Glucose-Capillary: 241 mg/dL — ABNORMAL HIGH (ref 70–99)
Glucose-Capillary: 298 mg/dL — ABNORMAL HIGH (ref 70–99)
Glucose-Capillary: 138 mg/dL — ABNORMAL HIGH (ref 70–99)

## 2011-06-07 MED ORDER — GUAIFENESIN-CODEINE 100-10 MG/5ML PO SOLN
10.0000 mL | ORAL | Status: DC | PRN
  Administered 2011-06-07 – 2011-06-08 (×2): 10 mL via ORAL
  Filled 2011-06-07 (×3): qty 5

## 2011-06-07 NOTE — Progress Notes (Signed)
Pt to bed transferred to room 339 per MD order. Family members aware of transfer. Pt transferred via wheelchair with personal belongings. Report given to RN.

## 2011-06-07 NOTE — Progress Notes (Signed)
NAME:  Jeff Ray, Jeff Ray                 ACCOUNT NO.:  [id number]  MEDICAL RECORD NO.:  [id number]  LOCATION:  IC03                          FACILITY:  [facility]  PHYSICIAN:  Kycen Spalla G. Renard Matter, MD   DATE OF BIRTH:  10/01/1956  DATE OF PROCEDURE: DATE OF DISCHARGE:                                PROGRESS NOTE   This patient has end-stage COPD, was admitted with respiratory distress and hypoxia.  He is feeling better, is on nasal cannula, still has some slight cough and wheezing.  OBJECTIVE:  VITAL SIGNS:  Blood pressure 156/74, respiration 20, pulse 76, temperature 98.6. LUNGS:  Diminished breath sounds with occasional rhonchus and heard in lower lung field. HEART:  Regular rhythm. ABDOMEN:  No palpable organs or masses.  ASSESSMENT:  The patient does have end-stage chronic obstructive pulmonary disease and asthma.  He was admitted with hypoxia and hypercapnia, seems to be gradually improving.  PLAN:  To continue IV antibiotics, Solu-Medrol and nebulizer treatments. We will repeat chest x-ray and blood gases today.  The patient could move out to the telemetry bed if bed available.     Merleen Picazo G. Renard Matter, MD     AGM/MEDQ  D:  06/07/2011  T:  06/07/2011  Job:  [id number]

## 2011-06-08 MED ORDER — METFORMIN HCL 1000 MG PO TABS: 1000.0000 mg | ORAL_TABLET | Freq: Two times a day (BID) | ORAL | Status: DC

## 2011-06-08 NOTE — Progress Notes (Signed)
Jeff Ray is being discharged home today and I will of course sign off at this point. I will be glad to see him in my office as needed.

## 2011-06-08 NOTE — Discharge Summary (Signed)
NAME:  Jeff Ray, Jeff Ray                 ACCOUNT NO.:  [id number]  MEDICAL RECORD NO.:  [id number]  LOCATION:  A339                          FACILITY:  [facility]  PHYSICIAN:  Chinaza Rooke G. Renard Matter, MD   DATE OF BIRTH:  06-Apr-1956  DATE OF ADMISSION:  06/02/2011 DATE OF DISCHARGE:  LH                              DISCHARGE SUMMARY   ADDENDUM  The patient did have CT of chest without contrast.  Findings, severe paraseptal and central lobular emphysematous changes, right lower lobe atelectasis.  No pulmonary opacity to correspond left upper lobe radiographic abnormality which may be osseous.  No pleural effusion or pneumothorax.  No suspicious mediastinal or axillary lymphadenopathy. Mild degenerative changes of the thoracolumbar spine.     Azjah Pardo G. Renard Matter, MD     AGM/MEDQ  D:  06/08/2011  T:  06/08/2011  Job:  [id number]

## 2011-06-08 NOTE — Discharge Summary (Signed)
NAME:  Jeff Ray, Jeff Ray                 ACCOUNT NO.:  [id number]  MEDICAL RECORD NO.:  [id number]  LOCATION:  A339                          FACILITY:  [facility]  PHYSICIAN:  Naja Apperson G. Renard Matter, MD   DATE OF BIRTH:  04/02/1956  DATE OF ADMISSION:  06/02/2011 DATE OF DISCHARGE:  09/04/2012LH                              DISCHARGE SUMMARY   DIAGNOSES: 1. Chronic obstructive pulmonary disease with hypoxia and hypercapnia. 2. Respiratory acidosis. 3. Hypertension. 4. Chronic anxiety.  CONDITION:  Stable and improved at the time of his discharge.  HISTORY:  This 55 year old Afro American male presented to the ED with shortness of breath.  The patient does have significant COPD with wheezing and orthopnea, was given Solu-Medrol and nebulizer treatment en route to the hospital, continued to be short of breath and O2 sat of 98% in the emergency department with a markedly elevated CO2, was admitted to intensive care unit.  OBJECTIVE:  VITAL SIGNS:  Blood pressure 139/93, pulse 117, temperature 97.5, respirations 21. GENERAL:  The patient was complaining of right-sided chest pain on admission as well. HEENT:  Eyes:  PERRLA.  TMs negative.  Oropharynx benign. NECK:  Supple.  No JVD or thyroid abnormalities. HEART:  Regular rhythm with sinus tachycardia. LUNGS:  Tachypneic with bilateral rhonchi and wheezes with decreased breath sounds bilaterally and use of accessory muscles for respiration. ABDOMEN:  Soft, nontender.  No palpable organs or masses. MUSCULOSKELETAL:  Normal range of motion. NEUROLOGICAL:  The patient is alert, oriented.  No motor or sensory problems. SKIN:  Warm and dry.  LABORATORY DATA:  Pertinent laboratory studies on admission:  WBC 30,100 with hemoglobin 15, hematocrit 47.9, 89 neutrophils, 9 lymphocytes. Chemistry showed sodium 136, potassium 4.9, chloride 92, CO2 39, glucose 141, BUN 8, creatinine 0.71, calcium 10.4, total protein 8.3, albumin 4.0, AST 13, ALT 8.  Blood  gases on admission; pH of 7.121 with a pCO2 of 122, pO2 181, bicarbonate 38.1.  Subsequent blood gases; pH 7.18, pCO2 of 92.8, pO2 73.1, bicarbonate 434, pCO2 of 31.4, acid-base 61. Blood gases on June 05, 2011; pH 7.393 with pCO2 of 64.5, pO2 76.2, bicarbonate 38.5, O2 saturation 95.4.  On June 07, 2011, blood gases pH 7.393 with pCO2 58.1, pO2 91.2, bicarbonate 34.6, acid-base 9.5. Blood sugars ranged from 242 to 298.  X-rays:  Chest x-ray on admission, increased interstitial problems without focal consolidation.  Chest x-ray, June 04, 2011, mild hyperinflation again noted, stable right base medial atelectasis.  No pulmonary edema.  HOSPITAL COURSE:  The patient at the time of this admission was admitted to ICU on BiPAP.  He was started on IV Rocephin 1 g every 24 hours, was continued on Glucophage 1000 mg b.i.d., Solu-Medrol 125 mg intravenously q.6 h and started on DVT prophylaxis with Lovenox 40 mg subcutaneously daily and NovoLog insulin sliding scale.  The patient had significant right-sided chest pain shortly after admission and did have morphine 2 mg every 4 hours p.r.n., also was given Xanax 0.5 mg q.i.d. p.r.n. and subsequently hydrocodone 5/325 every 6 hours p.r.n.  The patient remained on IV fluids.  The patient did have markedly abnormal blood gases on admission, his pH  was 7.393 with a pCO2 64.5, pO2 76.2, bicarbonate 38.5.  He gradually improved with BiPAP, which was transitioned to oxygen by nasal cannula.  He was seen in consultation by Dr. Juanetta Gosling of Pulmonology who felt that he was improving.  He agreed to have acute respiratory failure and had had severe COPD, but continued to smoke cigarettes, had probable anxiety and hypertension which was relatively well controlled.  He recommended continued use of antibiotics, inhale bronchodilators and steroids and BiPAP.  The patient showed progressive improvement.  He remained on sliding scale insulin. He did have  elevated blood sugars, which is thought to be aggravated by the steroids.  He was moved out of intensive care to medical floor on June 07, 2011, and was feeling much better. Today, it was felt he could be discharged home on following medications: 1. Metformin 1000 mg b.i.d. 2. Prednisone 10 mg b.i.d. 3. Levaquin 500 mg daily. 4. Nasal O2 3 liters per minute 5. P.r.n. Norco 5/325.  He is to continue home meds which is: 1. Singulair 10 mg daily. 2. Protonix 40 mg daily. 3. Lantus insulin 20 units daily. 4. Ibuprofen 800 mg as needed.     Hildegard Hlavac G. Renard Matter, MD     AGM/MEDQ  D:  06/08/2011  T:  06/08/2011  Job:  [id number]

## 2011-06-08 NOTE — Progress Notes (Signed)
Patient discharged home with family.

## 2011-06-30 LAB — BLOOD GAS, ARTERIAL
Acid-Base Excess: 9.2 — ABNORMAL HIGH
Acid-Base Excess: 4.4 — ABNORMAL HIGH
Bicarbonate: 32.7 — ABNORMAL HIGH
Bicarbonate: 33.7 — ABNORMAL HIGH
Bicarbonate: 29.3 — ABNORMAL HIGH
O2 Content: 2
O2 Content: 2
O2 Content: 2.5
O2 Saturation: 92.7
O2 Saturation: 93.1
Patient temperature: 37
Patient temperature: 37
TCO2: 31.4
TCO2: 26.1
pCO2 arterial: 64.7
pCO2 arterial: 72.9
pCO2 arterial: 74.3
pH, Arterial: 7.339 — ABNORMAL LOW
pH, Arterial: 7.379
pO2, Arterial: 63.8 — ABNORMAL LOW
pO2, Arterial: 67.9 — ABNORMAL LOW
pO2, Arterial: 80.1

## 2011-06-30 LAB — COMPREHENSIVE METABOLIC PANEL
AST: 16
Albumin: 3.3 — ABNORMAL LOW
Alkaline Phosphatase: 79
Chloride: 94 — ABNORMAL LOW
GFR calc Af Amer: >60
Potassium: 4.2
Sodium: 135
Total Bilirubin: 0.4
Total Protein: 6.7

## 2011-06-30 LAB — DIFFERENTIAL
Basophils Absolute: 0.2 — ABNORMAL HIGH
Basophils Relative: 3 — ABNORMAL HIGH
Eosinophils Relative: 3
Monocytes Absolute: 0.4
Monocytes Relative: 6
Neutro Abs: 3.3

## 2011-06-30 LAB — URINALYSIS, ROUTINE W REFLEX MICROSCOPIC
Bilirubin Urine: NEGATIVE
Glucose, UA: NEGATIVE
Hgb urine dipstick: NEGATIVE
Specific Gravity, Urine: 1.015
pH: 5.5

## 2011-06-30 LAB — BASIC METABOLIC PANEL
CO2: 35 — ABNORMAL HIGH
Calcium: 9.3
Creatinine, Ser: 1.06
GFR calc Af Amer: >60
GFR calc non Af Amer: >60
Sodium: 137

## 2011-06-30 LAB — CBC
Platelets: 180
RDW: 13.4
WBC: 6.1

## 2011-08-24 ENCOUNTER — Encounter (HOSPITAL_COMMUNITY): Payer: Self-pay | Admitting: Emergency Medicine

## 2011-08-24 ENCOUNTER — Inpatient Hospital Stay (HOSPITAL_COMMUNITY)
Admission: EM | Admit: 2011-08-24 | Discharge: 2011-08-27 | DRG: 189 | Disposition: A | Discharge status: Home / Self Care | Payer: Medicare HMO | Attending: Family Medicine | Admitting: Family Medicine

## 2011-08-24 ENCOUNTER — Other Ambulatory Visit: Payer: Self-pay

## 2011-08-24 ENCOUNTER — Emergency Department (HOSPITAL_COMMUNITY): Payer: Medicare HMO

## 2011-08-24 DIAGNOSIS — F17200 Nicotine dependence, unspecified, uncomplicated: Secondary | ICD-10-CM | POA: Diagnosis present

## 2011-08-24 DIAGNOSIS — E119 Type 2 diabetes mellitus without complications: Secondary | ICD-10-CM | POA: Diagnosis present

## 2011-08-24 DIAGNOSIS — J449 Chronic obstructive pulmonary disease, unspecified: Secondary | ICD-10-CM

## 2011-08-24 DIAGNOSIS — J441 Chronic obstructive pulmonary disease with (acute) exacerbation: Secondary | ICD-10-CM | POA: Diagnosis present

## 2011-08-24 DIAGNOSIS — J9620 Acute and chronic respiratory failure, unspecified whether with hypoxia or hypercapnia: Principal | ICD-10-CM | POA: Diagnosis present

## 2011-08-24 DIAGNOSIS — I10 Essential (primary) hypertension: Secondary | ICD-10-CM | POA: Diagnosis present

## 2011-08-24 LAB — BASIC METABOLIC PANEL
BUN: 7 mg/dL (ref 6–23)
CO2: 41 mEq/L (ref 19–32)
Chloride: 95 mEq/L — ABNORMAL LOW (ref 96–112)
Creatinine, Ser: 0.75 mg/dL (ref 0.50–1.35)
Glucose, Bld: 154 mg/dL — ABNORMAL HIGH (ref 70–99)

## 2011-08-24 LAB — BLOOD GAS, ARTERIAL
Acid-Base Excess: 10.1 mmol/L — ABNORMAL HIGH (ref 0.0–2.0)
Acid-Base Excess: 9.9 mmol/L — ABNORMAL HIGH (ref 0.0–2.0)
Bicarbonate: 37.7 mEq/L — ABNORMAL HIGH (ref 20.0–24.0)
Delivery systems: POSITIVE
Drawn by: 21310
Inspiratory PAP: 18
O2 Saturation: 96.7 %
TCO2: 34.7 mmol/L (ref 0–100)
TCO2: 33.7 mmol/L (ref 0–100)
pCO2 arterial: 94.5 mmHg (ref 35.0–45.0)
pO2, Arterial: 73.2 mmHg — ABNORMAL LOW (ref 80.0–100.0)
pO2, Arterial: 88.2 mmHg (ref 80.0–100.0)

## 2011-08-24 LAB — CBC
HCT: 42.6 % (ref 39.0–52.0)
Hemoglobin: 13.1 g/dL (ref 13.0–17.0)
MCHC: 30.8 g/dL (ref 30.0–36.0)
WBC: 8.4 10*3/uL (ref 4.0–10.5)

## 2011-08-24 LAB — DIFFERENTIAL
Lymphocytes Relative: 21 % (ref 12–46)
Monocytes Absolute: 0.6 10*3/uL (ref 0.1–1.0)
Monocytes Relative: 7 % (ref 3–12)
Neutro Abs: 6 10*3/uL (ref 1.7–7.7)

## 2011-08-24 LAB — GLUCOSE, CAPILLARY: Glucose-Capillary: 188 mg/dL — ABNORMAL HIGH (ref 70–99)

## 2011-08-24 LAB — MRSA PCR SCREENING: MRSA by PCR: NEGATIVE

## 2011-08-24 MED ORDER — METHYLPREDNISOLONE SODIUM SUCC 125 MG IJ SOLR
125.0000 mg | Freq: Four times a day (QID) | INTRAMUSCULAR | Status: DC
  Administered 2011-08-24 – 2011-08-27 (×12): 125 mg via INTRAVENOUS
  Filled 2011-08-24 (×12): qty 2

## 2011-08-24 MED ORDER — METHYLPREDNISOLONE SODIUM SUCC 125 MG IJ SOLR
INTRAMUSCULAR | Status: AC
  Administered 2011-08-25: 125 mg via INTRAVENOUS
  Filled 2011-08-24: qty 2

## 2011-08-24 MED ORDER — INSULIN ASPART 100 UNIT/ML ~~LOC~~ SOLN
0.0000 [IU] | Freq: Three times a day (TID) | SUBCUTANEOUS | Status: DC
  Administered 2011-08-25: 2 [IU] via SUBCUTANEOUS
  Administered 2011-08-25: 3 [IU] via SUBCUTANEOUS
  Administered 2011-08-25: 5 [IU] via SUBCUTANEOUS
  Administered 2011-08-26: 3 [IU] via SUBCUTANEOUS
  Administered 2011-08-26: 5 [IU] via SUBCUTANEOUS
  Administered 2011-08-26: 3 [IU] via SUBCUTANEOUS
  Administered 2011-08-27: 15 [IU] via SUBCUTANEOUS
  Administered 2011-08-27: 3 [IU] via SUBCUTANEOUS
  Filled 2011-08-24: qty 3

## 2011-08-24 MED ORDER — MAGNESIUM SULFATE 40 MG/ML IJ SOLN
2.0000 g | Freq: Once | INTRAMUSCULAR | Status: AC
  Administered 2011-08-24: 2 g via INTRAVENOUS
  Filled 2011-08-24: qty 50

## 2011-08-24 MED ORDER — SODIUM CHLORIDE 0.9 % IV SOLN
INTRAVENOUS | Status: DC
  Administered 2011-08-24 – 2011-08-25 (×2): via INTRAVENOUS

## 2011-08-24 MED ORDER — IPRATROPIUM BROMIDE 0.02 % IN SOLN
0.5000 mg | RESPIRATORY_TRACT | Status: DC
  Administered 2011-08-24 – 2011-08-27 (×19): 0.5 mg via RESPIRATORY_TRACT
  Filled 2011-08-24 (×18): qty 2.5

## 2011-08-24 MED ORDER — ALBUTEROL SULFATE (5 MG/ML) 0.5% IN NEBU
5.0000 mg | INHALATION_SOLUTION | Freq: Once | RESPIRATORY_TRACT | Status: AC
  Administered 2011-08-24: 5 mg via RESPIRATORY_TRACT
  Filled 2011-08-24: qty 1

## 2011-08-24 MED ORDER — ALBUTEROL (5 MG/ML) CONTINUOUS INHALATION SOLN
15.0000 mg | INHALATION_SOLUTION | Freq: Once | RESPIRATORY_TRACT | Status: AC
  Administered 2011-08-24: 15 mg via RESPIRATORY_TRACT
  Filled 2011-08-24: qty 20

## 2011-08-24 MED ORDER — DEXTROSE 5 % IV SOLN
1.0000 g | INTRAVENOUS | Status: DC
  Administered 2011-08-24 – 2011-08-26 (×3): 1 g via INTRAVENOUS
  Filled 2011-08-24 (×6): qty 10

## 2011-08-24 MED ORDER — IPRATROPIUM BROMIDE 0.02 % IN SOLN
0.5000 mg | Freq: Once | RESPIRATORY_TRACT | Status: AC
  Administered 2011-08-24: 0.5 mg via RESPIRATORY_TRACT
  Filled 2011-08-24: qty 2.5

## 2011-08-24 MED ORDER — DEXTROSE 5 % IV SOLN
500.0000 mg | INTRAVENOUS | Status: DC
  Administered 2011-08-24 – 2011-08-26 (×3): 500 mg via INTRAVENOUS
  Filled 2011-08-24 (×6): qty 500

## 2011-08-24 MED ORDER — ALBUTEROL SULFATE (5 MG/ML) 0.5% IN NEBU
2.5000 mg | INHALATION_SOLUTION | RESPIRATORY_TRACT | Status: DC
  Administered 2011-08-24 – 2011-08-27 (×19): 2.5 mg via RESPIRATORY_TRACT
  Filled 2011-08-24 (×16): qty 0.5

## 2011-08-24 MED ORDER — INSULIN ASPART 100 UNIT/ML ~~LOC~~ SOLN
0.0000 [IU] | Freq: Every day | SUBCUTANEOUS | Status: DC
  Administered 2011-08-26: 2 [IU] via SUBCUTANEOUS

## 2011-08-24 MED ORDER — DEXTROSE 5 % IV SOLN
INTRAVENOUS | Status: AC
  Filled 2011-08-24: qty 500

## 2011-08-24 MED ORDER — DEXTROSE 5 % IV SOLN
INTRAVENOUS | Status: AC
  Filled 2011-08-24: qty 10

## 2011-08-24 MED ORDER — IPRATROPIUM BROMIDE 0.02 % IN SOLN
RESPIRATORY_TRACT | Status: AC
  Filled 2011-08-24: qty 2.5

## 2011-08-24 NOTE — Progress Notes (Signed)
Patient has a repeat ABG done at 2245. PCO2 is down to 82.5 which has improved from 94.5. Critical value still reported to Dr. Sudie Bailey. Patient is currently on BiPAP

## 2011-08-24 NOTE — ED Notes (Signed)
Pt transferred to ICU room 3. Report given to [place], Charity fundraiser.

## 2011-08-24 NOTE — ED Notes (Signed)
Pt states shortness of breath started yesterday morning when he woke up.  Pt denies pain.  Pt states productive cough with yellow sputum since yesterday morning.

## 2011-08-24 NOTE — ED Notes (Signed)
Paged respiratory 

## 2011-08-24 NOTE — ED Notes (Signed)
Dr. Joanie Coddington paged for critical values report.

## 2011-08-24 NOTE — ED Notes (Signed)
Per ems, pt was exhibiting labored breathing and sob upon arrival.  Pt is on 2L of 02 at home all the time.  Per ems, pt 02 sats were 89% upon arrival.  Pt was given breathing treatment on the truck.  Pt is now 100% spo2 4L 02.  Pt is very anxious upon arrival.

## 2011-08-24 NOTE — H&P (Signed)
NAME:  Jeff Ray, Jeff Ray                 ACCOUNT NO.:  [id number]  MEDICAL RECORD NO.:  [id number]  LOCATION:  IC03                          FACILITY:  [facility]  PHYSICIAN:  Makensie Mulhall G. Renard Matter, MD   DATE OF BIRTH:  1956/03/18  DATE OF ADMISSION:  08/24/2011 DATE OF DISCHARGE:  LH                             HISTORY & PHYSICAL   This 55 year old Afro American male presented to the emergency room with a history of increased dyspnea over the past few days with a cough productive of green sputum.  He had been using at home nebulizer as shortness of breath and cough had improved some.  He has been hospitalized on frequent occasions in the past for COPD and has been on respirator on 1 occasion.  EMS brought the patient in.  He was hypoxic on arrival with O2 saturation of 89%.  Apparently received Solu-Medrol and albuterol nebulizer treatment prior to coming to the ED.  X-rays were obtained shortly after admission.  A chest x-ray showed emphysematous lung disease with prominent airway thickening suggestive of bronchitis.  CBC; WBC 8400 with hemoglobin 13.1, hematocrit 42.6. Arterial blood gases; pH 7.224 with pCO2 of 94.5, pO2 73.2.  The patient had to be put on BiPAP in the emergency department and was subsequently admitted to step-down unit and ICU.  The patient was started on IV Rocephin 1 g and Zithromax 500 mg intravenously along with nebulizer treatments.  He also was started on Solu-Medrol 125 mg every 6 hours.  SOCIAL HISTORY:  The patient continues to smoke cigarettes.  Does not use alcohol.  FAMILY HISTORY:  See previous record.  PAST MEDICAL HISTORY:  The patient does have a history of asthma, COPD, hypertension, anxiety, diabetes mellitus type 2.  He has previously been hospitalized for treatment of asthma, has been on respirator on one occasion.  REVIEW OF SYSTEMS:  CARDIOPULMONARY:  The patient has had productive cough and shortness of breath over the past few days.  GI:  No  bowel irregularity.  GU:  No dysuria or hematuria.  PHYSICAL EXAMINATION:  VITAL SIGNS:  On admission; blood pressure 152/88, pulse 115, temp 97.9. HEENT:  Eyes, PERRLA.  TM negative.  Oropharynx not examined. NECK:  Supple. LUNGS:  Diminished breath sounds and expiratory wheezes noted. HEART:  Regular rhythm.  No murmurs. ABDOMEN:  No palpable organs or masses.  No organomegaly. NEUROLOGIC:  No motor or sensory deficit.  ASSESSMENT:  The patient was admitted in respiratory distress with what was felt to be exacerbation of chronic obstructive pulmonary disease. He does have a history of diabetes mellitus type 2.  MEDICATION LIST: 1. Alprazolam 0.5 mg every 4 hours as needed. 2. ProAir HFA 2 puffs every 6 hours p.r.n. 3. Symbicort 2 puffs twice a day. 4. Zyrtec 5 mg daily. 5. Mucinex 600 mg tablets 1200 mg b.i.d. 6. Hycodan syrup teaspoon every 4 hours p.r.n. 7. Ibuprofen 800 mg q.6 h. p.r.n. 8. Metformin 1000 mg b.i.d. 9. Singulair 10 mg daily. 10.Prednisone 20 mg daily. 11.Zocor 40 mg daily.     Shawnice Tilmon G. Renard Matter, MD     AGM/MEDQ  D:  08/24/2011  T:  08/24/2011  Job:  [id number] 

## 2011-08-24 NOTE — Consult Note (Signed)
Consult requested by: Dr. Renard Matter Consult requested for respiratory failure:  HPI: This is a 55 year old African American male who has a long known history of severe COPD with chronic respiratory failure. He has had multiple bouts of acute respiratory failure. Her he has had to be intubated in the past but he has also had several bouts of acute respiratory failure that were able to be managed with BiPAP. He is currently on BiPAP. He is responsive although sluggish. Her apparently the history is that he developed problems to 3 days ago and eventually came to the emergency room where he was noted to be markedly short of breath and hypoxic.  Past Medical History  Diagnosis Date  . COPD (chronic obstructive pulmonary disease)   . Asthma   . Hypertension   . Anxiety      No family history on file.   History   Social History  . Marital Status: Married    Spouse Name: N/A    Number of Children: N/A  . Years of Education: N/A   Social History Main Topics  . Smoking status: Current Everyday Smoker -- 1.0 packs/day for 10 years    Types: Cigarettes  . Smokeless tobacco: None  . Alcohol Use: No  . Drug Use: No  . Sexually Active:    Other Topics Concern  . None   Social History Narrative  . None     ROS: Not obtainable    Objective: Vital signs in last 24 hours: Temp:  [97.9 F (36.6 C)-98.1 F (36.7 C)] 98.1 F (36.7 C) (11/20 1516) Pulse Rate:  [110-122] 115  (11/20 1800) Resp:  [18-30] 19  (11/20 1800) BP: (145-177)/(75-94) 152/88 mmHg (11/20 1800) SpO2:  [96 %-100 %] 98 % (11/20 1800) FiO2 (%):  [35 %] 35 % (11/20 1800) Weight:  [85.276 kg (188 lb)] 188 lb (85.276 kg) (11/20 1241) Weight change:     Intake/Output from previous day:    PHYSICAL EXAM He looks comfortable he is on BiPAP. He does respond some. His pupils are reactive. His mucous membranes are moist. His neck is supple. His chest shows decreased breath sounds and end expiratory wheezes. His heart is  regular without gallop. His abdomen is soft without masses. His extremities showed no clubbing cyanosis or edema. His central nervous system examination is grossly intact  Lab Results: Basic Metabolic Panel:  Basename 08/24/11 1319  NA 140  K 4.1  CL 95*  CO2 41*  GLUCOSE 154*  BUN 7  CREATININE 0.75  CALCIUM 10.4  MG --  PHOS --   Liver Function Tests: No results found for this basename: AST:2,ALT:2,ALKPHOS:2,BILITOT:2,PROT:2,ALBUMIN:2 in the last 72 hours No results found for this basename: LIPASE:2,AMYLASE:2 in the last 72 hours No results found for this basename: AMMONIA:2 in the last 72 hours CBC:  Basename 08/24/11 1319  WBC 8.4  NEUTROABS 6.0  HGB 13.1  HCT 42.6  MCV 94.5  PLT 228   Cardiac Enzymes:  Basename 08/24/11 1319  CKTOTAL --  CKMB --  CKMBINDEX --  TROPONINI <0.30   BNP:  Basename 08/24/11 1320  POCBNP 45.0   D-Dimer: No results found for this basename: DDIMER:2 in the last 72 hours CBG: No results found for this basename: GLUCAP:6 in the last 72 hours Hemoglobin A1C: No results found for this basename: HGBA1C in the last 72 hours Fasting Lipid Panel: No results found for this basename: CHOL,HDL,LDLCALC,TRIG,CHOLHDL,LDLDIRECT in the last 72 hours Thyroid Function Tests: No results found for this basename:  TSH,T4TOTAL,FREET4,T3FREE,THYROIDAB in the last 72 hours Anemia Panel: No results found for this basename: VITAMINB12,FOLATE,FERRITIN,TIBC,IRON,RETICCTPCT in the last 72 hours Coagulation: No results found for this basename: LABPROT:2,INR:2 in the last 72 hours Urine Drug Screen:  Alcohol Level: No results found for this basename: ETH:2 in the last 72 hours Urinalysis:  Misc. Labs:   ABGS:  Basename 08/24/11 1625  PHART 7.224*  PO2ART 73.2*  TCO2 34.7  HCO3 37.7*     MICROBIOLOGY: No results found for this or any previous visit (from the past 240 hour(s)).  Studies/Results: Dg Chest Portable 1 View  08/24/2011   *RADIOLOGY REPORT*  Clinical Data: Short of breath, COPD and tobacco use.  PORTABLE CHEST - 1 VIEW  Comparison: 06/07/2011  Findings: Stable emphysematous lung disease.  There may also be an overlying component of acute bronchitis given somewhat more prominent airway thickening in the lower lung zones bilaterally. No evidence of pulmonary edema or pleural effusion.  Heart size is normal.  No pneumothorax.  IMPRESSION: Emphysematous lung disease and more prominent airway thickening suggestive of bronchitis.  No focal infiltrate is identified.  Original Report Authenticated By: Reola Calkins, M.D.    Medications:  Prior to Admission:  Prescriptions prior to admission  Medication Sig Dispense Refill  . albuterol (PROAIR HFA) 108 (90 BASE) MCG/ACT inhaler Inhale 2 puffs into the lungs every 6 (six) hours as needed. Shortness of Breath      . ALPRAZolam (XANAX) 0.5 MG tablet Take 0.5 mg by mouth every 4 (four) hours as needed. For nerves      . Azelastine HCl (ASTEPRO) 0.15 % SOLN Place 1 spray into the nose as directed.        . budesonide-formoterol (SYMBICORT) 160-4.5 MCG/ACT inhaler Inhale 2 puffs into the lungs 2 (two) times daily.        . cetirizine (ZYRTEC) 5 MG tablet Take 5 mg by mouth daily.        [name] guaiFENesin (MUCINEX) 600 MG 12 hr tablet Take 1,200 mg by mouth 2 (two) times daily as needed. congestion       . HYDROcodone-homatropine (HYCODAN) 5-1.5 MG/5ML syrup Take 5 mLs by mouth every 4 (four) hours as needed. pain       . ibuprofen (ADVIL,MOTRIN) 800 MG tablet Take 800 mg by mouth.        . metFORMIN (GLUCOPHAGE) 1000 MG tablet Take 1 tablet (1,000 mg total) by mouth 2 (two) times daily.  60 tablet  5  . montelukast (SINGULAIR) 10 MG tablet Take 10 mg by mouth at bedtime.        . predniSONE (DELTASONE) 20 MG tablet Take 20 mg by mouth daily.        . simvastatin (ZOCOR) 40 MG tablet Take 40 mg by mouth at bedtime.         Scheduled:   . albuterol  5 mg Nebulization Once  .  albuterol  15 mg Nebulization Once  . ipratropium      . ipratropium  0.5 mg Nebulization Once  . ipratropium  0.5 mg Nebulization Once  . magnesium sulfate  2 g Intravenous Once  . methylPREDNISolone sodium succinate       Continuous:  PRN:  Assesment: He has acute on chronic respiratory failure. He has known severe COPD. He is on BiPAP and tolerating it well so far. Active Problems:  * No active hospital problems. *     Plan: I would like to see how he does on BiPAP and  see if his blood gases improved. He has been able to be managed on BiPAP without intubation and mechanical ventilation the last 2 times he is come to the hospital. If not of course he will have to be intubated and placed on a ventilator.    LOS: 0 days   Junelle Hashemi L 08/24/2011, 6:30 PM

## 2011-08-24 NOTE — ED Notes (Signed)
CRITICAL VALUE ALERT  Critical value received:  CO2 41  Date of notification:  08/24/11  Time of notification:  1415  Critical value read back: yes  Nurse who received alert:  Arline Asp RN  MD notified (1st page):  Preston Fleeting  Time of first page:  1415  MD notified (2nd page):  Time of second page:  Responding MD:  Preston Fleeting  Time MD responded:  1415

## 2011-08-24 NOTE — ED Notes (Signed)
CRITICAL VALUE ALERT  Critical value received: Ph 7.22, CO2 94.5, PO2 73.2, bicarb 37.7, so2 93.9  Date of notification: 08/24/2011  Time of notification: 17:00 Critical value read back:yes  Nurse who received alert:  Drinda Butts RN  MD notified (1st page):  Dr. Juanetta Gosling Time of first page:  17:00  MD notified (2nd page)  Time of second page  Responding MD:  Dr. Juanetta Gosling  Time MD responded:  17:00

## 2011-08-24 NOTE — ED Notes (Signed)
Dr. Joanie Coddington given critical value results. Order to call Dr. Juanetta Gosling stat.

## 2011-08-24 NOTE — ED Notes (Signed)
Per EMS pt recently diagnosed with pneumonia and pt has had yellow sputum production.  Per EMS pt has had episodes of SOB before.  Per EMS, 125 mg solumedrol and 1 albuterol treatment given en route.

## 2011-08-24 NOTE — ED Notes (Signed)
NOtified Dr. Juanetta Gosling of abg results and was instructed to send pt to the ICU and he would see pt there.

## 2011-08-24 NOTE — ED Provider Notes (Addendum)
History     CSN: [id number] Arrival date & time: 08/24/2011 12:44 PM   First MD Initiated Contact with Patient 08/24/11 1302      Chief Complaint  Patient presents with  . Shortness of Breath    (Consider location/radiation/quality/duration/timing/severity/associated sxs/prior treatment) Patient is a 55 y.o. male presenting with shortness of breath. The history is provided by the patient.  Shortness of Breath  Associated symptoms include shortness of breath.  Patient has a long history of COPD and is on home oxygen 2 L/ minute. Breathing started getting worse yesterday and he started developing a cough which has been productive of yellow to green sputum. He is not had any measured fever but stated that he felt warm. He has not had any chills or sweats. He denies any chest pain. Dyspnea and cough are temporarily improved after using his home nebulizer. He has had to be hospitalized many times for COPD and has been on a ventilator once. Dyspnea is worse when he lays flat and worse if he tries to exert himself. Symptoms have been severe. EMS reports he was hypoxic on arrival with O2 saturation of 89%. In the ambulance coming into the EEG, he received Solu-Medrol and albuterol nebulizer treatment. He states he is feeling somewhat better but still feels like he is working hard to breathe.  Past Medical History  Diagnosis Date  . COPD (chronic obstructive pulmonary disease)   . Asthma   . Hypertension   . Anxiety     Past Surgical History  Procedure Date  . Back surgery     No family history on file.  History  Substance Use Topics  . Smoking status: Current Everyday Smoker -- 1.0 packs/day for 10 years    Types: Cigarettes  . Smokeless tobacco: Not on file  . Alcohol Use: No      Review of Systems  Respiratory: Positive for shortness of breath.   All other systems reviewed and are negative.    Allergies  Review of patient's allergies indicates no known allergies.  Home  Medications   Current Outpatient Rx  Name Route Sig Dispense Refill  . ACETAMINOPHEN 80 MG PO CHEW Oral Chew 80 mg by mouth every 4 (four) hours as needed.      . ALBUTEROL SULFATE HFA 108 (90 BASE) MCG/ACT IN AERS Inhalation Inhale 2 puffs into the lungs every 6 (six) hours as needed.      . ALBUTEROL SULFATE HFA 108 (90 BASE) MCG/ACT IN AERS Inhalation Inhale 1 puff into the lungs every 6 (six) hours as needed. Shortness of breath     . ALPRAZOLAM 0.5 MG PO TABS Oral Take 0.5 mg by mouth every 4 (four) hours as needed. For nerves    . AZELASTINE HCL 0.15 % NA SOLN Nasal Place 1 spray into the nose as directed.      . BUDESONIDE-FORMOTEROL FUMARATE 160-4.5 MCG/ACT IN AERO Inhalation Inhale 2 puffs into the lungs 2 (two) times daily.      [name] CETIRIZINE HCL 5 MG PO TABS Oral Take 5 mg by mouth daily.      . GUAIFENESIN 600 MG PO TB12 Oral Take 1,200 mg by mouth 2 (two) times daily as needed. congestion     . HYDROCODONE-HOMATROPINE 5-1.5 MG/5ML PO SYRP Oral Take 5 mLs by mouth every 4 (four) hours as needed. pain     . IBUPROFEN 800 MG PO TABS Oral Take 800 mg by mouth.      . METFORMIN HCL 1000  MG PO TABS Oral Take 1 tablet (1,000 mg total) by mouth 2 (two) times daily. 60 tablet 5  . MONTELUKAST SODIUM 10 MG PO TABS Oral Take 10 mg by mouth at bedtime.      [name] PREDNISONE 20 MG PO TABS Oral Take 20 mg by mouth daily.      [name] SIMVASTATIN 40 MG PO TABS Oral Take 40 mg by mouth at bedtime.        BP 157/90  Pulse 113  Temp(Src) 97.9 F (36.6 C) (Oral)  Resp 30  Ht 6\' 2"  (1.88 m)  Wt 188 lb (85.276 kg)  BMI 24.14 kg/m2  SpO2 99%  Physical Exam  Nursing note and vitals reviewed.  55 year old male who is moderately dyspneic. Vital signs show tachypnea with respiratory rate of 30 and tachycardia with a heart rate of 113 and hypertension with blood pressure 157/90. Head is normocephalic and atraumatic. PERRLA, EOMI. Oropharynx is clear. Neck is supple without adenopathy. JVD is present at 90.  Back is nontender there is no CVA tenderness. Lungs have diffuse wheezes with diminished air flow. Heart has regular rate and rhythm without murmur. Abdomen is soft and nontender without masses or hepatosplenomegaly. Extremities have 1+ edema, no cyanosis. Full range of motion is present. Neurologic: Mental status is normal, cranial nerves are intact, there are no motor or sensory deficits. Psychiatric: No abnormalities of mood or affect.  ED Course  Procedures (including critical care time)   Labs Reviewed  CBC  DIFFERENTIAL  BASIC METABOLIC PANEL  PRO B NATRIURETIC PEPTIDE  I-STAT TROPONIN I   No results found.   No diagnosis found.   Date: 08/24/2011  Rate: 110  Rhythm: sinus tachycardia and premature atrial contractions (PAC)  QRS Axis: normal   Intervals: normal  ST/T Wave abnormalities: normal Conduction Disutrbances:right bundle branch block  Narrative Interpretation: Sinus tachycardia with right bundle branch block unchanged from ECG of 04/07/2011  Old EKG Reviewed: unchanged  Results for orders placed during the hospital encounter of 08/24/11  CBC      Component Value Range   WBC 8.4  4.0 - 10.5 (K/uL)   RBC 4.51  4.22 - 5.81 (MIL/uL)   Hemoglobin 13.1  13.0 - 17.0 (g/dL)   HCT 16.1  09.6 - 04.5 (%)   MCV 94.5  78.0 - 100.0 (fL)   MCH 29.0  26.0 - 34.0 (pg)   MCHC 30.8  30.0 - 36.0 (g/dL)   RDW 40.9  81.1 - 91.4 (%)   Platelets 228  150 - 400 (K/uL)  DIFFERENTIAL      Component Value Range   Neutrophils Relative 71  43 - 77 (%)   Neutro Abs 6.0  1.7 - 7.7 (K/uL)   Lymphocytes Relative 21  12 - 46 (%)   Lymphs Abs 1.7  0.7 - 4.0 (K/uL)   Monocytes Relative 7  3 - 12 (%)   Monocytes Absolute 0.6  0.1 - 1.0 (K/uL)   Eosinophils Relative 1  0 - 5 (%)   Eosinophils Absolute 0.1  0.0 - 0.7 (K/uL)   Basophils Relative 0  0 - 1 (%)   Basophils Absolute 0.0  0.0 - 0.1 (K/uL)  BASIC METABOLIC PANEL      Component Value Range   Sodium 140  135 - 145 (mEq/L)    Potassium 4.1  3.5 - 5.1 (mEq/L)   Chloride 95 (*) 96 - 112 (mEq/L)   CO2 41 (*) 19 - 32 (mEq/L)   Glucose, Bld  154 (*) 70 - 99 (mg/dL)   BUN 7  6 - 23 (mg/dL)   Creatinine, Ser 1.61  0.50 - 1.35 (mg/dL)   Calcium 09.6  8.4 - 10.5 (mg/dL)   GFR calc non Af Amer >90  >90 (mL/min)   GFR calc Af Amer >90  >90 (mL/min)  PRO B NATRIURETIC PEPTIDE      Component Value Range   BNP, POC 45.0  0 - 125 (pg/mL)  TROPONIN I      Component Value Range   Troponin I <0.30  <0.30 (ng/mL)   Dg Chest Portable 1 View  08/24/2011  *RADIOLOGY REPORT*  Clinical Data: Short of breath, COPD and tobacco use.  PORTABLE CHEST - 1 VIEW  Comparison: 06/07/2011  Findings: Stable emphysematous lung disease.  There may also be an overlying component of acute bronchitis given somewhat more prominent airway thickening in the lower lung zones bilaterally. No evidence of pulmonary edema or pleural effusion.  Heart size is normal.  No pneumothorax.  IMPRESSION: Emphysematous lung disease and more prominent airway thickening suggestive of bronchitis.  No focal infiltrate is identified.  Original Report Authenticated By: Reola Calkins, M.D.     Reexam at 1410 following first albuterol nebulizer treatment in the ED shows he is still using accessory muscles or respirations the has diffuse wheezes. He will be tried on any hour-long continuous nebulization treatment.  Case is discussed with Dr. Renard Matter who agrees to admit the patient. He has given admitting orders to  the patient's RN.  He continues to be exceedingly dyspneic. He has required BiPAP in the past and BiPAP is placed.  CRITICAL CARE Performed by: Dione Booze   Total critical care time: 120 minutes  Critical care time was exclusive of separately billable procedures and treating other patients.  Critical care was necessary to treat or prevent imminent or life-threatening deterioration.  Critical care was time spent personally by me on the following  activities: development of treatment plan with patient and/or surrogate as well as nursing, discussions with consultants, evaluation of patient's response to treatment, examination of patient, obtaining history from patient or surrogate, ordering and performing treatments and interventions, ordering and review of laboratory studies, ordering and review of radiographic studies, pulse oximetry and re-evaluation of patient's condition.   MDM  Old ED visits and hospitalizations have been reviewed. He has had several hospitalizations for COPD exacerbations.        Dione Booze, MD 08/24/11 1515  Dione Booze, MD 08/24/11 [phone]

## 2011-08-24 NOTE — ED Notes (Signed)
Jeff Ray [id number]

## 2011-08-25 ENCOUNTER — Inpatient Hospital Stay (HOSPITAL_COMMUNITY): Payer: Medicare HMO

## 2011-08-25 LAB — BLOOD GAS, ARTERIAL
Delivery systems: POSITIVE
Drawn by: 21310
Expiratory PAP: 9
FIO2: 30 %
Inspiratory PAP: 20
O2 Saturation: 94.1 %
Patient temperature: 37

## 2011-08-25 LAB — BASIC METABOLIC PANEL
BUN: 8 mg/dL (ref 6–23)
Calcium: 10.4 mg/dL (ref 8.4–10.5)
GFR calc Af Amer: >90 mL/min (ref >90)
GFR calc non Af Amer: >90 mL/min (ref >90)
Potassium: 4.7 mEq/L (ref 3.5–5.1)
Sodium: 136 mEq/L (ref 135–145)

## 2011-08-25 LAB — CBC
MCH: 28.4 pg (ref 26.0–34.0)
MCHC: 30.1 g/dL (ref 30.0–36.0)
Platelets: 246 10*3/uL (ref 150–400)

## 2011-08-25 LAB — DIFFERENTIAL
Basophils Relative: 0 % (ref 0–1)
Eosinophils Absolute: 0 10*3/uL (ref 0.0–0.7)
Eosinophils Relative: 0 % (ref 0–5)
Monocytes Relative: 1 % — ABNORMAL LOW (ref 3–12)
Neutrophils Relative %: 89 % — ABNORMAL HIGH (ref 43–77)

## 2011-08-25 LAB — GLUCOSE, CAPILLARY
Glucose-Capillary: 195 mg/dL — ABNORMAL HIGH (ref 70–99)
Glucose-Capillary: 209 mg/dL — ABNORMAL HIGH (ref 70–99)

## 2011-08-25 MED ORDER — GUAIFENESIN ER 600 MG PO TB12
600.0000 mg | ORAL_TABLET | Freq: Two times a day (BID) | ORAL | Status: DC | PRN
  Administered 2011-08-25 – 2011-08-27 (×3): 600 mg via ORAL
  Filled 2011-08-25 (×3): qty 1

## 2011-08-25 MED ORDER — GUAIFENESIN-CODEINE 100-10 MG/5ML PO SOLN: 10.0000 mL | Freq: Four times a day (QID) | ORAL | Status: DC | PRN

## 2011-08-25 NOTE — Progress Notes (Signed)
Subjective: He looks much better. He is awake and alert. He says he's hungry. He denies shortness of breath. He remains on BiPAP for the moment.  Objective: Vital signs in last 24 hours: Temp:  [97.7 F (36.5 C)-98.3 F (36.8 C)] 98.2 F (36.8 C) (11/21 0716) Pulse Rate:  [96-122] 101  (11/21 0700) Resp:  [13-30] 15  (11/21 0700) BP: (116-177)/(73-94) 123/81 mmHg (11/21 0700) SpO2:  [93 %-100 %] 93 % (11/21 0700) FiO2 (%):  [30 %-35 %] 30 % (11/21 0700) Weight:  [78.1 kg (172 lb 2.9 oz)-85.276 kg (188 lb)] 173 lb 11.6 oz (78.8 kg) (11/21 0405) Weight change:  Last BM Date:  (prior to admit)  Intake/Output from previous day: 11/20 0701 - 11/21 0700 In: 1850 [I.V.:1550; IV Piggyback:300] Out: 250 [Urine:250]  PHYSICAL EXAM General appearance: alert, cooperative and mild distress Resp: rhonchi bilaterally and wheezes bilaterally Cardio: regular rate and rhythm, S1, S2 normal, no murmur, click, rub or gallop GI: soft, non-tender; bowel sounds normal; no masses,  no organomegaly Extremities: extremities normal, atraumatic, no cyanosis or edema  Lab Results:    Basic Metabolic Panel:  Basename 08/25/11 0434 08/24/11 1319  NA 136 140  K 4.7 4.1  CL 93* 95*  CO2 35* 41*  GLUCOSE 173* 154*  BUN 8 7  CREATININE 0.65 0.75  CALCIUM 10.4 10.4  MG -- --  PHOS -- --   Liver Function Tests: No results found for this basename: AST:2,ALT:2,ALKPHOS:2,BILITOT:2,PROT:2,ALBUMIN:2 in the last 72 hours No results found for this basename: LIPASE:2,AMYLASE:2 in the last 72 hours No results found for this basename: AMMONIA:2 in the last 72 hours CBC:  Basename 08/25/11 0434 08/24/11 1319  WBC 6.4 8.4  NEUTROABS 5.7 6.0  HGB 12.0* 13.1  HCT 39.9 42.6  MCV 94.3 94.5  PLT 246 228   Cardiac Enzymes:  Basename 08/24/11 1319  CKTOTAL --  CKMB --  CKMBINDEX --  TROPONINI <0.30   BNP:  Basename 08/24/11 1320  POCBNP 45.0   D-Dimer: No results found for this basename: DDIMER:2  in the last 72 hours CBG:  Basename 08/24/11 2205  GLUCAP 188*   Hemoglobin A1C: No results found for this basename: HGBA1C in the last 72 hours Fasting Lipid Panel: No results found for this basename: CHOL,HDL,LDLCALC,TRIG,CHOLHDL,LDLDIRECT in the last 72 hours Thyroid Function Tests: No results found for this basename: TSH,T4TOTAL,FREET4,T3FREE,THYROIDAB in the last 72 hours Anemia Panel: No results found for this basename: VITAMINB12,FOLATE,FERRITIN,TIBC,IRON,RETICCTPCT in the last 72 hours Coagulation: No results found for this basename: LABPROT:2,INR:2 in the last 72 hours Urine Drug Screen:  Alcohol Level: No results found for this basename: ETH:2 in the last 72 hours Urinalysis:  Misc. Labs:  ABGS  Basename 08/25/11 0520  PHART 7.349*  PO2ART 68.5*  TCO2 32.8  HCO3 36.0*   CULTURES Recent Results (from the past 240 hour(s))  MRSA PCR SCREENING     Status: Normal   Collection Time   08/24/11  5:50 PM      Component Value Range Status Comment   MRSA by PCR NEGATIVE  NEGATIVE  Final    Studies/Results: Dg Chest Portable 1 View  08/24/2011  *RADIOLOGY REPORT*  Clinical Data: Short of breath, COPD and tobacco use.  PORTABLE CHEST - 1 VIEW  Comparison: 06/07/2011  Findings: Stable emphysematous lung disease.  There may also be an overlying component of acute bronchitis given somewhat more prominent airway thickening in the lower lung zones bilaterally. No evidence of pulmonary edema or pleural effusion.  Heart size is normal.  No pneumothorax.  IMPRESSION: Emphysematous lung disease and more prominent airway thickening suggestive of bronchitis.  No focal infiltrate is identified.  Original Report Authenticated By: Reola Calkins, M.D.    Medications:  Prior to Admission:  Prescriptions prior to admission  Medication Sig Dispense Refill  . albuterol (PROAIR HFA) 108 (90 BASE) MCG/ACT inhaler Inhale 2 puffs into the lungs every 6 (six) hours as needed. Shortness of  Breath      . ALPRAZolam (XANAX) 0.5 MG tablet Take 0.5 mg by mouth every 4 (four) hours as needed. For nerves      . Azelastine HCl (ASTEPRO) 0.15 % SOLN Place 1 spray into the nose as directed.        . budesonide-formoterol (SYMBICORT) 160-4.5 MCG/ACT inhaler Inhale 2 puffs into the lungs 2 (two) times daily.        . cetirizine (ZYRTEC) 5 MG tablet Take 5 mg by mouth daily.        [name] guaiFENesin (MUCINEX) 600 MG 12 hr tablet Take 1,200 mg by mouth 2 (two) times daily as needed. congestion       . HYDROcodone-homatropine (HYCODAN) 5-1.5 MG/5ML syrup Take 5 mLs by mouth every 4 (four) hours as needed. pain       . ibuprofen (ADVIL,MOTRIN) 800 MG tablet Take 800 mg by mouth.        . metFORMIN (GLUCOPHAGE) 1000 MG tablet Take 1 tablet (1,000 mg total) by mouth 2 (two) times daily.  60 tablet  5  . montelukast (SINGULAIR) 10 MG tablet Take 10 mg by mouth at bedtime.        . predniSONE (DELTASONE) 20 MG tablet Take 20 mg by mouth daily.        . simvastatin (ZOCOR) 40 MG tablet Take 40 mg by mouth at bedtime.         Scheduled:   . ipratropium  0.5 mg Nebulization Q4H   And  . albuterol  2.5 mg Nebulization Q4H  . albuterol  5 mg Nebulization Once  . albuterol  15 mg Nebulization Once  . azithromycin  500 mg Intravenous Q24H  . cefTRIAXone (ROCEPHIN) IV  1 g Intravenous Q24H  . insulin aspart  0-15 Units Subcutaneous TID WC  . insulin aspart  0-5 Units Subcutaneous QHS  . ipratropium      . ipratropium  0.5 mg Nebulization Once  . ipratropium  0.5 mg Nebulization Once  . magnesium sulfate  2 g Intravenous Once  . methylPREDNISolone (SOLU-MEDROL) injection  125 mg Intravenous Q6H  . methylPREDNISolone sodium succinate       Continuous:   . sodium chloride 100 mL/hr at 08/25/11 0515   PRN:  Assesment: He has acute on chronic respiratory failure. He is markedly improved after BiPAP overnight. He has severe COPD Active Problems:  * No active hospital problems. *     Plan: I  think he can have a diet now and we can try him off of BiPAP. In the past he has required BiPAP intermittently several days after he has become acutely ill.    LOS: 1 day   Jeff Ray 08/25/2011, 7:59 AM

## 2011-08-25 NOTE — Progress Notes (Signed)
NAME:  Jeff Ray, [name]                 ACCOUNT NO.:  [id number]  MEDICAL RECORD NO.:  [id number]  LOCATION:                                 FACILITY:  PHYSICIAN:  [name], MD   DATE OF BIRTH:  [birth year]  DATE OF PROCEDURE: DATE OF DISCHARGE:                                PROGRESS NOTE   This patient presented to the emergency room with increasing dyspnea. He does have severe COPD.  He was hypoxic on arrival with O2 saturation of 89%.  Chest x-ray did show emphysematous lung disease with prominent airway thickening suggestive of bronchitis.  Blood gases on admission showed a pH of 7.224 with a pCO2 of 94.5 and pO2 of 73.2.  The patient was put on BiPAP and in step-down unit and ICU he remains relatively comfortable today.  His most recent blood gases show a pH of 7.349 with a pCO2 of 67.0 and pO2 68.5.  CBC 6.4, hemoglobin 12.2, hematocrit 39.9. Recent glucose 188.  The patient remains on BiPAP.  Has occasional rhonchus heard over lower lung field.  Heart, regular rhythm.  Abdomen, no palpable organs or masses.  Extremities, free of edema.  ASSESSMENT:  The patient does have chronic respiratory failure and chronic obstructive pulmonary disease.  He is diabetic.  He remains on BiPAP and is tolerating this well.  PLAN:  To continue current regimen.     Jeff Ray G. Renard Matter, MD     AGM/MEDQ  D:  08/25/2011  T:  08/25/2011  Job:  [id number]

## 2011-08-25 NOTE — Progress Notes (Signed)
PCO2 of 67 this am. Will continue with BiPAP treatment as value has much improved over night

## 2011-08-25 NOTE — Progress Notes (Signed)
Inpatient Diabetes Program Recommendations  AACE/ADA: New Consensus Statement on Inpatient Glycemic Control (2009)  Target Ranges:  Prepandial:   less than 140 mg/dL      Peak postprandial:   less than 180 mg/dL (1-2 hours)      Critically ill patients:  140 - 180 mg/dL   Reason for Visit: Patient on IV Solu-medrol and elevated glucose  Inpatient Diabetes Program Recommendations Insulin - Basal: If fasting glucose remain elevated: >140 mg/dL, add basal insulin:  Start Lantus 10 units daily

## 2011-08-26 LAB — GLUCOSE, CAPILLARY: Glucose-Capillary: 164 mg/dL — ABNORMAL HIGH (ref 70–99)

## 2011-08-26 MED ORDER — SODIUM CHLORIDE 0.9 % IJ SOLN
INTRAMUSCULAR | Status: AC
  Administered 2011-08-26: 12:00:00
  Filled 2011-08-26: qty 3

## 2011-08-26 NOTE — Progress Notes (Signed)
Patient has refused to wear bipap tonight have asked several times patient states he will call if he really feels like he needs it he has also expressed this to th rn tonight as well sats 99% on 3lpm Wallace hr 100 resp 18

## 2011-08-26 NOTE — Progress Notes (Signed)
Subjective: He is much improved. He is awake and alert. He has orders to transfer from the ICU which I think is appropriate. He says he's hopeful of going home tomorrow. He is not short of breath.  Objective: Vital signs in last 24 hours: Temp:  [97.9 F (36.6 C)-99 F (37.2 C)] 98.2 F (36.8 C) (11/22 0400) Pulse Rate:  [89-117] 98  (11/22 0700) Resp:  [14-25] 21  (11/22 0700) BP: (107-145)/(51-113) 119/61 mmHg (11/22 0700) SpO2:  [94 %-100 %] 98 % (11/22 0738) FiO2 (%):  [30 %] 30 % (11/21 1700) Weight:  [79.3 kg (174 lb 13.2 oz)] 174 lb 13.2 oz (79.3 kg) (11/22 0500) Weight change: -5.976 kg (-13 lb 2.8 oz) Last BM Date:  (prior to admit)  Intake/Output from previous day: 11/21 0701 - 11/22 0700 In: 2900 [P.O.:300; I.V.:2300; IV Piggyback:300] Out: 1300 [Urine:1300]  PHYSICAL EXAM General appearance: alert, cooperative and no distress Resp: clear to auscultation bilaterally Cardio: regular rate and rhythm, S1, S2 normal, no murmur, click, rub or gallop GI: soft, non-tender; bowel sounds normal; no masses,  no organomegaly Extremities: extremities normal, atraumatic, no cyanosis or edema  Lab Results:    Basic Metabolic Panel:  Basename 08/25/11 0434 08/24/11 1319  NA 136 140  K 4.7 4.1  CL 93* 95*  CO2 35* 41*  GLUCOSE 173* 154*  BUN 8 7  CREATININE 0.65 0.75  CALCIUM 10.4 10.4  MG -- --  PHOS -- --   Liver Function Tests: No results found for this basename: AST:2,ALT:2,ALKPHOS:2,BILITOT:2,PROT:2,ALBUMIN:2 in the last 72 hours No results found for this basename: LIPASE:2,AMYLASE:2 in the last 72 hours No results found for this basename: AMMONIA:2 in the last 72 hours CBC:  Basename 08/25/11 0434 08/24/11 1319  WBC 6.4 8.4  NEUTROABS 5.7 6.0  HGB 12.0* 13.1  HCT 39.9 42.6  MCV 94.3 94.5  PLT 246 228   Cardiac Enzymes:  Basename 08/24/11 1319  CKTOTAL --  CKMB --  CKMBINDEX --  TROPONINI <0.30   BNP:  Basename 08/24/11 1320  POCBNP 45.0    D-Dimer: No results found for this basename: DDIMER:2 in the last 72 hours CBG:  Basename 08/25/11 2128 08/25/11 1619 08/25/11 1141 08/25/11 0813 08/24/11 2205  GLUCAP 184* 125* 209* 195* 188*   Hemoglobin A1C: No results found for this basename: HGBA1C in the last 72 hours Fasting Lipid Panel: No results found for this basename: CHOL,HDL,LDLCALC,TRIG,CHOLHDL,LDLDIRECT in the last 72 hours Thyroid Function Tests: No results found for this basename: TSH,T4TOTAL,FREET4,T3FREE,THYROIDAB in the last 72 hours Anemia Panel: No results found for this basename: VITAMINB12,FOLATE,FERRITIN,TIBC,IRON,RETICCTPCT in the last 72 hours Coagulation: No results found for this basename: LABPROT:2,INR:2 in the last 72 hours Urine Drug Screen:  Alcohol Level: No results found for this basename: ETH:2 in the last 72 hours Urinalysis:  Misc. Labs:  ABGS  Basename 08/25/11 0520  PHART 7.349*  PO2ART 68.5*  TCO2 32.8  HCO3 36.0*   CULTURES Recent Results (from the past 240 hour(s))  MRSA PCR SCREENING     Status: Normal   Collection Time   08/24/11  5:50 PM      Component Value Range Status Comment   MRSA by PCR NEGATIVE  NEGATIVE  Final    Studies/Results: Dg Chest Port 1 View  08/25/2011  *RADIOLOGY REPORT*  Clinical Data: Respiratory failure.  PORTABLE CHEST - 1 VIEW  Comparison: 08/24/2011  Findings: There is hyperinflation of the lungs compatible with COPD.  Heart is normal size.  Lungs are  clear.  Minimal bibasilar scarring.  No effusions or acute bony abnormality.  IMPRESSION: COPD/chronic changes.  No active disease.  Original Report Authenticated By: Cyndie Chime, M.D.   Dg Chest Portable 1 View  08/24/2011  *RADIOLOGY REPORT*  Clinical Data: Short of breath, COPD and tobacco use.  PORTABLE CHEST - 1 VIEW  Comparison: 06/07/2011  Findings: Stable emphysematous lung disease.  There may also be an overlying component of acute bronchitis given somewhat more prominent airway  thickening in the lower lung zones bilaterally. No evidence of pulmonary edema or pleural effusion.  Heart size is normal.  No pneumothorax.  IMPRESSION: Emphysematous lung disease and more prominent airway thickening suggestive of bronchitis.  No focal infiltrate is identified.  Original Report Authenticated By: Reola Calkins, M.D.    Medications:  Scheduled:   . ipratropium  0.5 mg Nebulization Q4H   And  . albuterol  2.5 mg Nebulization Q4H  . azithromycin  500 mg Intravenous Q24H  . cefTRIAXone (ROCEPHIN) IV  1 g Intravenous Q24H  . insulin aspart  0-15 Units Subcutaneous TID WC  . insulin aspart  0-5 Units Subcutaneous QHS  . methylPREDNISolone (SOLU-MEDROL) injection  125 mg Intravenous Q6H   Continuous:   . sodium chloride 100 mL/hr at 08/25/11 0515   ZOX:[id number], DISCONTD: [id number]  Assesment: He has acute on chronic respiratory failure and is much improved. He is to be transferred from the ICU today. He may be able to go home tomorrow. Active Problems:  * No active hospital problems. *     Plan: I will plan to follow more peripherally if he's doing as well as he is today I think he should be okay to go home from a strictly pulmonary point of view if he is otherwise stable    LOS: 2 days   Kelvin Sennett L 08/26/2011, 8:37 AM

## 2011-08-26 NOTE — Progress Notes (Signed)
Pt to be transferred to room 323 per MD order. Report called to RN. Pt transferred via wheelchair with belongings.

## 2011-08-26 NOTE — Progress Notes (Signed)
NAME:  MARIANO, DOSHI                 ACCOUNT NO.:  [id number]  MEDICAL RECORD NO.:  [id number]  LOCATION:  IC03                          FACILITY:  [facility]  PHYSICIAN:  Allecia Bells G. Renard Matter, MD   DATE OF BIRTH:  1955-12-22  DATE OF PROCEDURE: DATE OF DISCHARGE:                                PROGRESS NOTE   This patient had been off BiPAP through the night.  Does have nasal cannula, seems to be feeling better.  He does have history of severe COPD, was hypoxic on arrival.  Chest x-ray did show emphysematous lung disease with prominent airway thickening suggestive of bronchitis.  Most recent blood gases shows a pH of 7.349 with pCO2 67, pO2 68.5. Chemistries are normal with exception of slightly elevated glucose 173, chloride 93, CO2 35.  Hemoglobin 12.0, hematocrit 39.9.  Current blood sugar 184.  PHYSICAL EXAMINATION:  LUNGS:  Occasional rhonchus heard over lower lung field. HEART:  Regular rhythm. ABDOMEN:  No palpable organs or masses. EXTREMITIES:  Free of edema.  ASSESSMENT:  The patient does have chronic respiratory failure, chronic obstructive pulmonary disease.  He was admitted with respiratory distress.  He is a diabetic as well and his sugars are in fairly acceptable range.  PLAN:  To continue current regimen.  We will move the patient out of the ICU to regular medical floor.     Kemiya Batdorf G. Renard Matter, MD     AGM/MEDQ  D:  08/26/2011  T:  08/26/2011  Job:  [id number]

## 2011-08-27 LAB — GLUCOSE, CAPILLARY
Glucose-Capillary: 177 mg/dL — ABNORMAL HIGH (ref 70–99)
Glucose-Capillary: 389 mg/dL — ABNORMAL HIGH (ref 70–99)

## 2011-08-27 MED ORDER — SALINE SPRAY 0.65 % NA SOLN
1.0000 | NASAL | Status: DC | PRN
  Filled 2011-08-27: qty 44

## 2011-08-27 NOTE — Progress Notes (Signed)
Writer reviewed discharge instructions and medications to pt, verbalized understanding.  Encouraged to call with any questions that may arise.  Pt took all belongings and own portable o2 tank in stable condition.  Pt wheel chaired out in stable condition and in no distress.

## 2011-08-27 NOTE — Discharge Summary (Signed)
NAME:  Jeff Ray, Jeff Ray                 ACCOUNT NO.:  [id number]  MEDICAL RECORD NO.:  [id number]  LOCATION:  A323                          FACILITY:  [facility]  PHYSICIAN:  Severn Goddard G. Renard Matter, MD   DATE OF BIRTH:  July 08, 1956  DATE OF ADMISSION:  08/24/2011 DATE OF DISCHARGE:  11/23/2012LH                              DISCHARGE SUMMARY   DIAGNOSES: 1. Chronic obstructive pulmonary disease exacerbation. 2. Diabetes mellitus type 2. 3. Chronic cigarette smoker. 4. History of asthma. 5. Hypertension.  Condition is stable and improved at time of his discharge.  This 55 year old [place] American male presented to the emergency room with a history of increased dyspnea and cough over the past few days prior to admission.  He had previously been on respirator, he was hypoxic on arrival to the ED with O2 saturation 89%.  He received Solu- Medrol and albuterol nebulizer prior to coming to the ED.  X-rays were obtained shortly after admission.  Chest x-ray showed evidence of emphysematous lung disease with prominent airway thickening suggestive of bronchitis.  His pH of 7.224 with a pCO2 of 94.5, pO2 73.2.  The patient put on BiPAP in the emergency department, subsequently admitted to Step-Down Unit and ICU.  He was started on IV Rocephin 1 g and Zithromax 500 mg intravenously along with nebulizer treatments, and also was started on Solu-Medrol 125 mg every 6 hours.  PHYSICAL EXAMINATION ON ADMISSION.:  GENERAL:  Alert, but dyspneic male. VITAL SIGNS:  Blood pressure 152/88, pulse 113, and temp 97.9.  HEENT: Eyes, PERRLA.  TM negative.  Oropharynx benign. NECK:  Supple.  No JVD or thyroid abnormalities. LUNGS:  Diminished breath sounds.  Expiratory wheezes bilaterally. HEART:  Regular rhythm.  No murmurs. ABDOMEN:  No palpable organs or masses.  No organomegaly.  NEUROLOGIC: No focal deficit.  LABORATORY DATA:  CBC, WBC 6400 with hemoglobin 12.0, hematocrit 39.9, 89.% neutrophils, 10% lymphocytes.   Chemistries on admission, sodium 136, potassium 4.7, chloride 93, CO2 of 35, BUN 6, creatinine 0.65, calcium 10.4, GFR greater than 90, glucose 173.  The patient's blood gases on admission, pH of 7.272 with a pCO2 of 82.5, pO2 88.2, bicarb 36.8.  Subsequently, blood gases, pH 7.349 with a pCO2 of 67.0 and a pO2 of 68.5, bicarb 36.0.  Blood sugars range from a low 125 to a high of 209.  X-RAYS:  Portable chest x-ray, COPD, no active disease.  Prominent airway thickening suggestive of bronchitis.  HOSPITAL COURSE:  In the emergency department, the patient was placed on IV Rocephin 1 g every 24 hours and Zithromax 500 mg IV q.24 hours.  He was also placed on BiPAP and started on albuterol nebulizations every 4 hours and Solu-Medrol 125 mg IV q.6 hours.  The patient was quite hypoxic on admission with pH of 7.272 with a pCO2 of 82.5, O2 sats 88.2, with treatment this improved to pH 7.349 with a pCO2 of 67.0 and pO2 68.4.  The patient at the time of admission also was placed on heart- healthy diet.  He also was given insulin 15 units t.i.d. and continued ipratropium nebulization every 4 hours and Solu-Medrol 125 mg IV q.6 hours.  The  patient slowly improved with treatment and on the third hospital day, it was felt he could be discharged.  The patient also was seen by Dr. Juanetta Gosling, Pulmonology, who agreed with therapy.  The patient was discharged on the following medications; 1. Mucinex 1200 mg b.i.d. 2. Levaquin 500 mg daily. 3. Medrol Dosepak 4 mg 6 days. 4. He was told to continue the metformin a 1000 mg twice a day. 5. Continue the DuoNeb treatments every 4 hours. 6. Continue the BiPAP.  The patient did have a sequential compression device on leg during his hospitalization.  He is asked to return to the doctor's office in 3-5 days.     Jeff Ray G. Renard Matter, MD     AGM/MEDQ  D:  08/27/2011  T:  08/27/2011  Job:  [id number]

## 2011-08-28 NOTE — Discharge Summary (Signed)
NAME:  Jeff Ray, Jeff Ray                 ACCOUNT NO.:  [id number]  MEDICAL RECORD NO.:  [id number]  LOCATION:  A323                          FACILITY:  [facility]  PHYSICIAN:  Meshulem Onorato G. Renard Matter, MD   DATE OF BIRTH:  1956/01/06  DATE OF ADMISSION:  08/24/2011 DATE OF DISCHARGE:  LH                              DISCHARGE SUMMARY   ADDENDUM:  DISCHARGE MEDICATION LIST: 1. Xanax 0.5 mg every 4 hours as needed for anxiety. 2. Zyrtec 5 mg daily. 3. Singulair 10 mg daily.     Winda Summerall G. Renard Matter, MD     AGM/MEDQ  D:  08/27/2011  T:  08/27/2011  Job:  [id number]

## 2012-01-10 ENCOUNTER — Emergency Department (HOSPITAL_COMMUNITY): Payer: Medicare HMO

## 2012-01-10 ENCOUNTER — Encounter (HOSPITAL_COMMUNITY): Payer: Self-pay | Admitting: *Deleted

## 2012-01-10 ENCOUNTER — Emergency Department (HOSPITAL_COMMUNITY)
Admission: EM | Admit: 2012-01-10 | Discharge: 2012-01-11 | Disposition: A | Discharge status: Home / Self Care | Payer: Medicare HMO | Attending: Emergency Medicine | Admitting: Emergency Medicine

## 2012-01-10 DIAGNOSIS — F411 Generalized anxiety disorder: Secondary | ICD-10-CM | POA: Insufficient documentation

## 2012-01-10 DIAGNOSIS — J449 Chronic obstructive pulmonary disease, unspecified: Secondary | ICD-10-CM

## 2012-01-10 DIAGNOSIS — E119 Type 2 diabetes mellitus without complications: Secondary | ICD-10-CM | POA: Insufficient documentation

## 2012-01-10 DIAGNOSIS — J441 Chronic obstructive pulmonary disease with (acute) exacerbation: Secondary | ICD-10-CM | POA: Insufficient documentation

## 2012-01-10 DIAGNOSIS — J189 Pneumonia, unspecified organism: Secondary | ICD-10-CM | POA: Insufficient documentation

## 2012-01-10 DIAGNOSIS — F17200 Nicotine dependence, unspecified, uncomplicated: Secondary | ICD-10-CM | POA: Insufficient documentation

## 2012-01-10 DIAGNOSIS — R0602 Shortness of breath: Secondary | ICD-10-CM | POA: Insufficient documentation

## 2012-01-10 DIAGNOSIS — I10 Essential (primary) hypertension: Secondary | ICD-10-CM | POA: Insufficient documentation

## 2012-01-10 MED ORDER — ALBUTEROL SULFATE (5 MG/ML) 0.5% IN NEBU
2.5000 mg | INHALATION_SOLUTION | Freq: Once | RESPIRATORY_TRACT | Status: AC
  Administered 2012-01-10: 2.5 mg via RESPIRATORY_TRACT
  Filled 2012-01-10: qty 0.5

## 2012-01-10 MED ORDER — ALBUTEROL SULFATE HFA 108 (90 BASE) MCG/ACT IN AERS
2.0000 | INHALATION_SPRAY | Freq: Four times a day (QID) | RESPIRATORY_TRACT | Status: DC
  Administered 2012-01-11: 2 via RESPIRATORY_TRACT
  Filled 2012-01-10: qty 6.7

## 2012-01-10 MED ORDER — IPRATROPIUM BROMIDE 0.02 % IN SOLN
0.5000 mg | Freq: Once | RESPIRATORY_TRACT | Status: AC
  Administered 2012-01-10: 0.5 mg via RESPIRATORY_TRACT
  Filled 2012-01-10: qty 2.5

## 2012-01-10 MED ORDER — PREDNISONE 20 MG PO TABS
60.0000 mg | ORAL_TABLET | Freq: Once | ORAL | Status: AC
  Administered 2012-01-10: 60 mg via ORAL
  Filled 2012-01-10: qty 3

## 2012-01-10 MED ORDER — AZITHROMYCIN 250 MG PO TABS
500.0000 mg | ORAL_TABLET | Freq: Every day | ORAL | Status: DC
  Administered 2012-01-10: 500 mg via ORAL
  Filled 2012-01-10: qty 2

## 2012-01-10 MED ORDER — PREDNISONE 10 MG PO TABS: 40.0000 mg | ORAL_TABLET | Freq: Every day | ORAL | Status: DC

## 2012-01-10 MED ORDER — AZITHROMYCIN 250 MG PO TABS: 250.0000 mg | ORAL_TABLET | Freq: Every day | ORAL | Status: AC

## 2012-01-10 NOTE — ED Notes (Signed)
MD at stretcherside to see patient.

## 2012-01-10 NOTE — ED Notes (Signed)
Per EMS pt from home with c/o shortness of breath. Lung sounds clear. Alert and oriented x 4, ambulated at scene. Pt used home inhaler albuterol x 6 PTA without relief. No meds received from route. Vital signs BP 138/74 HR 110 O2 98% 6L [place] R 24.

## 2012-01-10 NOTE — Discharge Instructions (Signed)
Chronic Obstructive Pulmonary Disease Chronic obstructive pulmonary disease (COPD) is a condition in which airflow from the lungs is restricted. The lungs can never return to normal, but there are measures you can take which will improve them and make you feel better. CAUSES   Smoking.   Exposure to secondhand smoke.   Breathing in irritants (pollution, cigarette smoke, strong smells, aerosol sprays, paint fumes).   History of lung infections.  TREATMENT  Treatment focuses on making you comfortable (supportive care). Your caregiver may prescribe medications (inhaled or pills) to help improve your breathing. HOME CARE INSTRUCTIONS   If you smoke, stop smoking.   Avoid exposure to smoke, chemicals, and fumes that aggravate your breathing.   Take antibiotic medicines as directed by your caregiver.   Avoid medicines that dry up your system and slow down the elimination of secretions (antihistamines and cough syrups). This decreases respiratory capacity and may lead to infections.   Drink enough water and fluids to keep your urine clear or pale yellow. This loosens secretions.   Use humidifiers at home and at your bedside if they do not make breathing difficult.   Receive all protective vaccines your caregiver suggests, especially pneumococcal and influenza.   Use home oxygen as suggested.   Stay active. Exercise and physical activity will help maintain your ability to do things you want to do.   Eat a healthy diet.  SEEK MEDICAL CARE IF:   You develop pus-like mucus (sputum).   Breathing is more labored or exercise becomes difficult to do.   You are running out of the medicine you take for your breathing.  SEEK IMMEDIATE MEDICAL CARE IF:   You have a rapid heart rate.   You have agitation, confusion, tremors, or are in a stupor (family members may need to observe this).   It becomes difficult to breathe.   You develop chest pain.   You have a fever.  MAKE SURE YOU:    Understand these instructions.   Will watch your condition.   Will get help right away if you are not doing well or get worse.  Document Released: 06/30/2005 Document Revised: 09/09/2011 Document Reviewed: 11/20/2010 [facility] Patient Information 2012 [place], [place].  Use albuterol inhaler 2 puffs every 6 hours at least for the next week. Take prednisone 40 mg once a day for the next 5 days. Take Z-Pak Zithromax for questionable pneumonia as directed. Followup with Dr. Megan Mans in the next one to 2 days. Return for any new or worse symptoms.

## 2012-01-10 NOTE — ED Provider Notes (Signed)
History     CSN: [id number]  Arrival date & time 01/10/12  1478   First MD Initiated Contact with Patient 01/10/12 1941      Chief Complaint  Patient presents with  . Shortness of Breath  . Anxiety    (Consider location/radiation/quality/duration/timing/severity/associated sxs/prior treatment) Patient is a 56 y.o. male presenting with shortness of breath and anxiety. The history is provided by the patient and the spouse.  Shortness of Breath  The current episode started 2 days ago. The problem has been gradually worsening. The problem is moderate. The symptoms are relieved by nothing. The symptoms are aggravated by nothing. Associated symptoms include a fever, cough, shortness of breath and wheezing. Pertinent negatives include no chest pain, no chest pressure and no sore throat.  Anxiety Associated symptoms include shortness of breath. Pertinent negatives include no chest pain, no abdominal pain and no headaches.   patient followed by Dr. Megan Mans here locally. Patient known to have COPD. A short of breath for the past few days but got worse today. Patient's nebulizer machine is not working recently broke however he is using albuterol inhaler. Patient on a tapered steroid dose pack by Dr. Megan Mans still has not completed that but was down to just a few pills. Patient stated recently seen and treated for strep throat no longer taking an antibiotic but cannot rule out antibiotic he was prescribed. Patient normally on 2-3 L of nasal cannula oxygen at home at all the time.  Past Medical History  Diagnosis Date  . COPD (chronic obstructive pulmonary disease)   . Asthma   . Hypertension   . Anxiety   . Diabetes mellitus     Past Surgical History  Procedure Date  . Back surgery     No family history on file.  History  Substance Use Topics  . Smoking status: Current Everyday Smoker -- 1.0 packs/day for 10 years    Types: Cigarettes  . Smokeless tobacco: Not on file  . Alcohol Use:  No      Review of Systems  Constitutional: Positive for fever.  HENT: Negative for congestion, sore throat and neck pain.   Eyes: Negative for redness.  Respiratory: Positive for cough, shortness of breath and wheezing.   Cardiovascular: Negative for chest pain.  Gastrointestinal: Negative for nausea, vomiting, abdominal pain and diarrhea.  Genitourinary: Negative for dysuria.  Musculoskeletal: Negative for back pain.  Skin: Negative for rash.  Neurological: Negative for headaches.  Hematological: Does not bruise/bleed easily.    Allergies  Review of patient's allergies indicates no known allergies.  Home Medications   Current Outpatient Rx  Name Route Sig Dispense Refill  . ALBUTEROL SULFATE HFA 108 (90 BASE) MCG/ACT IN AERS Inhalation Inhale 2 puffs into the lungs every 6 (six) hours as needed. Shortness of Breath    . CEFDINIR 300 MG PO CAPS Oral Take 300 mg by mouth 2 (two) times daily.    . GUAIFENESIN ER 600 MG PO TB12 Oral Take 1,200 mg by mouth daily. congestion    . IBUPROFEN 800 MG PO TABS Oral Take 800 mg by mouth daily as needed. For pain    . IPRATROPIUM-ALBUTEROL 0.5-2.5 (3) MG/3ML IN SOLN Nebulization Take 3 mLs by nebulization every 4 (four) hours as needed. For shortness of breath    . METFORMIN HCL 1000 MG PO TABS Oral Take 1 tablet (1,000 mg total) by mouth 2 (two) times daily. 60 tablet 5  . METHYLPREDNISOLONE 4 MG PO KIT Oral Take 4  mg by mouth as directed. follow package directions    . MONTELUKAST SODIUM 10 MG PO TABS Oral Take 10 mg by mouth at bedtime.      . AZITHROMYCIN 250 MG PO TABS Oral Take 1 tablet (250 mg total) by mouth daily. Take first 2 tablets together, then 1 every day until finished. 6 tablet 0  . PREDNISONE 10 MG PO TABS Oral Take 4 tablets (40 mg total) by mouth daily. 20 tablet 0    BP 136/73  Pulse 109  Temp(Src) 98.7 F (37.1 C) (Oral)  Resp 24  SpO2 100%  Physical Exam  Nursing note and vitals reviewed. Constitutional: He is  oriented to person, place, and time. He appears well-developed and well-nourished. No distress.  HENT:  Head: Normocephalic and atraumatic.  Mouth/Throat: Oropharynx is clear and moist.  Eyes: Conjunctivae and EOM are normal. Pupils are equal, round, and reactive to light.  Neck: Normal range of motion. Neck supple.  Cardiovascular: Normal rate, regular rhythm and normal heart sounds.   No murmur heard. Pulmonary/Chest: Effort normal. No respiratory distress. He has wheezes. He has no rales. He exhibits no tenderness.  Abdominal: Soft. Bowel sounds are normal. There is no tenderness.  Musculoskeletal: Normal range of motion. He exhibits no edema and no tenderness.  Neurological: He is alert and oriented to person, place, and time. No cranial nerve deficit. He exhibits normal muscle tone. Coordination normal.  Skin: Skin is warm. No rash noted. He is not diaphoretic.    ED Course  Procedures (including critical care time)  Labs Reviewed - No data to display Dg Chest 2 View  01/10/2012  *RADIOLOGY REPORT*  Clinical Data: Cough, congestion and shortness of breath; history of smoking.  CHEST - 2 VIEW  Comparison: Chest radiograph performed 08/25/2011  Findings: The lungs are mildly hyperexpanded, with flattening of the hemidiaphragms, compatible with COPD.  Minimal bibasilar opacities may reflect atelectasis or possibly mild pneumonia. There is no evidence of pleural effusion or pneumothorax.  The heart is normal in size; the mediastinal contour is within normal limits.  No acute osseous abnormalities are seen.  IMPRESSION: Minimal bibasilar opacities may reflect atelectasis or possibly mild pneumonia; findings of COPD.  Original Report Authenticated By: Tonia Ghent, M.D.     1. COPD (chronic obstructive pulmonary disease)   2. CAP (community acquired pneumonia)       MDM   Patient with COPD exacerbation. Improved here with a single albuterol Atrovent nebulizer clear lungs better air  movement on his usual amount of oxygen satting 99% in the emergency department. Chest x-ray with most likely COPD changes questionable pneumonia this would be community acquired pneumonia we'll treat with Zithromax. Patient given albuterol inhaler to go home with his nebulizer recently broke he needs to contact his primary care doctor to have that replaced and do that tomorrow. Also had been on a tapering Dosepak of steroids gave him a full dose of 60 mg prednisone by mouth here in the emergency room continue him on 40 mg of prednisone for the next 5 days. Patient will return for new or worse symptoms. Patient feels well enough to go home.         Shelda Jakes, MD 01/10/12 [phone]

## 2012-01-12 ENCOUNTER — Emergency Department (HOSPITAL_COMMUNITY): Payer: Medicare HMO

## 2012-01-12 ENCOUNTER — Encounter (HOSPITAL_COMMUNITY): Payer: Self-pay | Admitting: *Deleted

## 2012-01-12 ENCOUNTER — Inpatient Hospital Stay (HOSPITAL_COMMUNITY)
Admission: EM | Admit: 2012-01-12 | Discharge: 2012-01-18 | DRG: 208 | Disposition: A | Discharge status: Home Health | Payer: Medicare HMO | Attending: Family Medicine | Admitting: Family Medicine

## 2012-01-12 DIAGNOSIS — J9690 Respiratory failure, unspecified, unspecified whether with hypoxia or hypercapnia: Secondary | ICD-10-CM

## 2012-01-12 DIAGNOSIS — E119 Type 2 diabetes mellitus without complications: Secondary | ICD-10-CM | POA: Diagnosis present

## 2012-01-12 DIAGNOSIS — Z794 Long term (current) use of insulin: Secondary | ICD-10-CM

## 2012-01-12 DIAGNOSIS — F17200 Nicotine dependence, unspecified, uncomplicated: Secondary | ICD-10-CM | POA: Diagnosis present

## 2012-01-12 DIAGNOSIS — IMO0002 Reserved for concepts with insufficient information to code with codable children: Secondary | ICD-10-CM

## 2012-01-12 DIAGNOSIS — J189 Pneumonia, unspecified organism: Principal | ICD-10-CM | POA: Diagnosis present

## 2012-01-12 DIAGNOSIS — F411 Generalized anxiety disorder: Secondary | ICD-10-CM | POA: Diagnosis present

## 2012-01-12 DIAGNOSIS — R0689 Other abnormalities of breathing: Secondary | ICD-10-CM

## 2012-01-12 DIAGNOSIS — J4489 Other specified chronic obstructive pulmonary disease: Secondary | ICD-10-CM | POA: Diagnosis present

## 2012-01-12 DIAGNOSIS — Z79899 Other long term (current) drug therapy: Secondary | ICD-10-CM

## 2012-01-12 DIAGNOSIS — J449 Chronic obstructive pulmonary disease, unspecified: Secondary | ICD-10-CM | POA: Diagnosis present

## 2012-01-12 DIAGNOSIS — E875 Hyperkalemia: Secondary | ICD-10-CM | POA: Diagnosis present

## 2012-01-12 DIAGNOSIS — R4182 Altered mental status, unspecified: Secondary | ICD-10-CM | POA: Diagnosis present

## 2012-01-12 DIAGNOSIS — I10 Essential (primary) hypertension: Secondary | ICD-10-CM | POA: Diagnosis present

## 2012-01-12 DIAGNOSIS — J9600 Acute respiratory failure, unspecified whether with hypoxia or hypercapnia: Secondary | ICD-10-CM | POA: Diagnosis present

## 2012-01-12 LAB — BLOOD GAS, ARTERIAL
Acid-Base Excess: 8.3 mmol/L — ABNORMAL HIGH (ref 0.0–2.0)
Bicarbonate: 39.5 mEq/L — ABNORMAL HIGH (ref 20.0–24.0)
Drawn by: 23534
FIO2: 0.5 %
MECHVT: 500 mL
O2 Content: 100 L/min
O2 Content: 50 L/min
O2 Saturation: 99.3 %
TCO2: 32.9 mmol/L (ref 0–100)
pCO2 arterial: 86.7 mmHg (ref 35.0–45.0)
pH, Arterial: 7.186 — CL (ref 7.350–7.450)
pO2, Arterial: 388 mmHg — ABNORMAL HIGH (ref 80.0–100.0)
pO2, Arterial: 95 mmHg (ref 80.0–100.0)

## 2012-01-12 LAB — CBC
Hemoglobin: 13.4 g/dL (ref 13.0–17.0)
MCH: 29.2 pg (ref 26.0–34.0)
Platelets: 332 10*3/uL (ref 150–400)
RBC: 4.59 MIL/uL (ref 4.22–5.81)
WBC: 13 10*3/uL — ABNORMAL HIGH (ref 4.0–10.5)

## 2012-01-12 LAB — GLUCOSE, CAPILLARY

## 2012-01-12 LAB — DIFFERENTIAL
Basophils Relative: 0 % (ref 0–1)
Eosinophils Relative: 0 % (ref 0–5)
Lymphocytes Relative: 6 % — ABNORMAL LOW (ref 12–46)
Monocytes Relative: 9 % (ref 3–12)
Neutrophils Relative %: 85 % — ABNORMAL HIGH (ref 43–77)
WBC Morphology: INCREASED

## 2012-01-12 LAB — BASIC METABOLIC PANEL
CO2: 41 mEq/L (ref 19–32)
Chloride: 90 mEq/L — ABNORMAL LOW (ref 96–112)
Glucose, Bld: 195 mg/dL — ABNORMAL HIGH (ref 70–99)
Potassium: 6.3 mEq/L (ref 3.5–5.1)
Sodium: 135 mEq/L (ref 135–145)

## 2012-01-12 LAB — MRSA PCR SCREENING: MRSA by PCR: NEGATIVE

## 2012-01-12 LAB — LACTIC ACID, PLASMA: Lactic Acid, Venous: 2.2 mmol/L (ref 0.5–2.2)

## 2012-01-12 MED ORDER — ETOMIDATE 2 MG/ML IV SOLN
20.0000 mg | Freq: Once | INTRAVENOUS | Status: AC
  Administered 2012-01-12: 20 mg via INTRAVENOUS

## 2012-01-12 MED ORDER — METHYLPREDNISOLONE SODIUM SUCC 125 MG IJ SOLR
125.0000 mg | Freq: Once | INTRAMUSCULAR | Status: AC
  Administered 2012-01-12: 125 mg via INTRAVENOUS
  Filled 2012-01-12: qty 2

## 2012-01-12 MED ORDER — SUCCINYLCHOLINE CHLORIDE 20 MG/ML IJ SOLN
150.0000 mg | Freq: Once | INTRAMUSCULAR | Status: AC
  Administered 2012-01-12: 150 mg via INTRAVENOUS

## 2012-01-12 MED ORDER — VANCOMYCIN HCL IN DEXTROSE 1-5 GM/200ML-% IV SOLN
1000.0000 mg | Freq: Two times a day (BID) | INTRAVENOUS | Status: DC
  Administered 2012-01-12 – 2012-01-14 (×4): 1000 mg via INTRAVENOUS
  Filled 2012-01-12 (×11): qty 200

## 2012-01-12 MED ORDER — INSULIN ASPART 100 UNIT/ML ~~LOC~~ SOLN
10.0000 [IU] | Freq: Once | SUBCUTANEOUS | Status: AC
  Administered 2012-01-12: 10 [IU] via SUBCUTANEOUS
  Filled 2012-01-12: qty 1

## 2012-01-12 MED ORDER — DEXTROSE 5 % IV SOLN
500.0000 mg | INTRAVENOUS | Status: DC
  Filled 2012-01-12: qty 500

## 2012-01-12 MED ORDER — METHYLPREDNISOLONE SODIUM SUCC 125 MG IJ SOLR
125.0000 mg | Freq: Four times a day (QID) | INTRAMUSCULAR | Status: DC
  Administered 2012-01-12 – 2012-01-15 (×10): 125 mg via INTRAVENOUS
  Filled 2012-01-12 (×11): qty 2

## 2012-01-12 MED ORDER — VANCOMYCIN HCL IN DEXTROSE 1-5 GM/200ML-% IV SOLN
INTRAVENOUS | Status: AC
  Filled 2012-01-12: qty 200

## 2012-01-12 MED ORDER — ALBUTEROL SULFATE (5 MG/ML) 0.5% IN NEBU
2.5000 mg | INHALATION_SOLUTION | RESPIRATORY_TRACT | Status: DC
  Administered 2012-01-12 – 2012-01-18 (×34): 2.5 mg via RESPIRATORY_TRACT
  Filled 2012-01-12 (×34): qty 0.5

## 2012-01-12 MED ORDER — PROPOFOL 10 MG/ML IV EMUL
5.0000 ug/kg/min | Freq: Once | INTRAVENOUS | Status: AC
  Administered 2012-01-12: 10 ug/kg/min via INTRAVENOUS

## 2012-01-12 MED ORDER — PIPERACILLIN-TAZOBACTAM 3.375 G IVPB
3.3750 g | Freq: Three times a day (TID) | INTRAVENOUS | Status: DC
  Administered 2012-01-12 – 2012-01-17 (×16): 3.375 g via INTRAVENOUS
  Filled 2012-01-12 (×21): qty 50

## 2012-01-12 MED ORDER — DEXTROSE 50 % IV SOLN: 1.0000 | Freq: Once | INTRAVENOUS | Status: DC

## 2012-01-12 MED ORDER — DEXTROSE 50 % IV SOLN
25.0000 g | Freq: Once | INTRAVENOUS | Status: AC
  Administered 2012-01-12: 25 g via INTRAVENOUS

## 2012-01-12 MED ORDER — DOXYCYCLINE HYCLATE 100 MG IV SOLR
INTRAVENOUS | Status: AC
  Filled 2012-01-12: qty 100

## 2012-01-12 MED ORDER — DEXTROSE 5 % IV SOLN
1.0000 g | INTRAVENOUS | Status: DC
  Administered 2012-01-12: 1 g via INTRAVENOUS
  Filled 2012-01-12: qty 10

## 2012-01-12 MED ORDER — INSULIN ASPART 100 UNIT/ML ~~LOC~~ SOLN
0.0000 [IU] | Freq: Three times a day (TID) | SUBCUTANEOUS | Status: DC
  Administered 2012-01-13: 3 [IU] via SUBCUTANEOUS
  Administered 2012-01-13: 5 [IU] via SUBCUTANEOUS
  Administered 2012-01-13: 2 [IU] via SUBCUTANEOUS

## 2012-01-12 MED ORDER — IPRATROPIUM BROMIDE 0.02 % IN SOLN
0.5000 mg | RESPIRATORY_TRACT | Status: DC
  Administered 2012-01-12 – 2012-01-18 (×34): 0.5 mg via RESPIRATORY_TRACT
  Filled 2012-01-12 (×33): qty 2.5

## 2012-01-12 MED ORDER — NALOXONE HCL 0.4 MG/ML IJ SOLN
0.4000 mg | Freq: Once | INTRAMUSCULAR | Status: AC
  Administered 2012-01-12: 0.4 mg via INTRAVENOUS

## 2012-01-12 MED ORDER — MIDAZOLAM HCL 5 MG/5ML IJ SOLN
INTRAMUSCULAR | Status: AC
  Administered 2012-01-12: 4 mg
  Filled 2012-01-12: qty 5

## 2012-01-12 MED ORDER — SODIUM CHLORIDE 0.9 % IJ SOLN
INTRAMUSCULAR | Status: AC
  Filled 2012-01-12: qty 3

## 2012-01-12 MED ORDER — DOXYCYCLINE HYCLATE 100 MG IV SOLR
100.0000 mg | Freq: Two times a day (BID) | INTRAVENOUS | Status: DC
  Administered 2012-01-12 – 2012-01-17 (×10): 100 mg via INTRAVENOUS
  Filled 2012-01-12 (×12): qty 100

## 2012-01-12 MED ORDER — PANTOPRAZOLE SODIUM 40 MG IV SOLR
40.0000 mg | Freq: Every day | INTRAVENOUS | Status: DC
  Administered 2012-01-12 – 2012-01-17 (×6): 40 mg via INTRAVENOUS
  Filled 2012-01-12 (×5): qty 40

## 2012-01-12 MED ORDER — PIPERACILLIN-TAZOBACTAM 3.375 G IVPB
INTRAVENOUS | Status: AC
  Filled 2012-01-12: qty 100

## 2012-01-12 MED ORDER — DEXTROSE 5 % IV SOLN
INTRAVENOUS | Status: AC
  Filled 2012-01-12: qty 10

## 2012-01-12 MED ORDER — DEXTROSE 50 % IV SOLN
INTRAVENOUS | Status: AC
  Filled 2012-01-12: qty 50

## 2012-01-12 MED ORDER — PROPOFOL 10 MG/ML IV EMUL
5.0000 ug/kg/min | INTRAVENOUS | Status: DC
  Administered 2012-01-13: 25 ug/kg/min via INTRAVENOUS
  Administered 2012-01-13: 30 ug/kg/min via INTRAVENOUS
  Administered 2012-01-13: 15 ug/kg/min via INTRAVENOUS
  Filled 2012-01-12 (×7): qty 100

## 2012-01-12 MED ORDER — FENTANYL CITRATE 0.05 MG/ML IJ SOLN
INTRAMUSCULAR | Status: AC
  Administered 2012-01-12: 100 ug
  Filled 2012-01-12: qty 2

## 2012-01-12 MED ORDER — PROPOFOL 10 MG/ML IV EMUL
INTRAVENOUS | Status: AC
  Filled 2012-01-12: qty 100

## 2012-01-12 MED ORDER — CHLORHEXIDINE GLUCONATE 0.12 % MT SOLN
15.0000 mL | Freq: Two times a day (BID) | OROMUCOSAL | Status: DC
  Administered 2012-01-12 – 2012-01-17 (×10): 15 mL via OROMUCOSAL
  Filled 2012-01-12 (×10): qty 15

## 2012-01-12 MED ORDER — DEXTROSE 5 % IV SOLN
INTRAVENOUS | Status: AC
  Filled 2012-01-12: qty 500

## 2012-01-12 NOTE — H&P (Signed)
NAME:  Jeff Ray, Jeff Ray                 ACCOUNT NO.:  [id number]  MEDICAL RECORD NO.:  [id number]  LOCATION:  IC05                          FACILITY:  [facility]  PHYSICIAN:  Mila Homer. Sudie Bailey, M.D.DATE OF BIRTH:  [birth year]  DATE OF ADMISSION:  01/12/2012 DATE OF DISCHARGE:  LH                             HISTORY & PHYSICAL   This is a 56 year old who has had multiple admissions to this hospital for COPD exacerbation.  Was last seen in the emergency room here 2 days ago, when he had another COPD flare-up.  He went home and apparently got gradually worse, until his sister saw him this morning and noted that he was confused and disoriented.  The EMS was called and transferred him to the hospital.  He is followed by Dr. Ishmael Holter. McInnis.  In addition to COPD, he has had hypertension, diabetes and anxiety.    His sister says he is still smoking cigarettes despite all these problems.  He is married and lives in [place].  CURRENT MEDICATIONS: 1. ProAir inhaler 2 puffs q.6 hours as needed. 2. Zithromax 250 mg daily. 3. Omnicef 300 mg b.i.d. 4. Guaifenesin 600 mg 2 daily. 5. Ibuprofen 800 mg as needed for pain. 6. Ipratropium/albuterol by nebulizer. 7. Metformin 1000 mg b.i.d. 8. Methylprednisolone 4 mg as directed. 9. Singulair 10 mg q.h.s. 10.Prednisone 10 mg 4 tablets daily.  Based on the history, I am not sure whether he is using methylprednisolone and prednisone at the same time, and I am not sure whether he is using azithromycin and Omnicef at the same time.  The patient is currently in the ICU on a vent, and it is impossible to get a history from him.  PHYSICAL EXAMINATION:  GENERAL:  At the time of my exam, he was in the ICU as noted.  He was doing well on the ventilator. VITAL SIGNS:  Temperature was 98.5, pulse 116, respiratory rate 32. Blood pressure ranged between 93/66 to 192/91. LUNGS:  Showed decreased breath sounds throughout.  There were no rhonchi or wheezes  heard. HEART:  His heart had a regular rhythm, and it was tachycardic at a rate of about 110 at the time I examined him. ABDOMEN:  Soft without organomegaly or mass. EXTREMITIES:  There was no edema of the ankles.  His admission white cell count was 13,000, of which 85% were neutrophils, 6 lymphs.  His potassium was 6.3, chloride 90, bicarb 41. Pro-beta natriuretic peptide 197.  Lactic acid 2.2.  Glucose 195.  Blood cultures x2 are pending.  Urine culture is likewise pending.  Chest x-ray showed COPD and patchy opacities in the right upper lobe, both lung bases, which could be pneumonia.  His 12-lead EKG showed sinus tachycardia with a rate of 108 as well as a right bundle branch block.  His EKG was actually improved compared to one done August 24, 2011.  Blood gases initially showed a pH of 7.186, pCO2 of 109, pO2 of 388, and on the ventilator sometime later the pH was 7.23, pCO2 of 86.  ADMISSION DIAGNOSES: 1. Respiratory arrest. 2. Chronic obstructive pulmonary disease. 3. Tobacco use disorder. 4. Type 2 diabetes. 5. Benign essential hypertension.  6. Anxiety.  He is admitted to the hospital on IV fluids, IV steroids and antibiotics.  Since he has been in and out of the hospital so much, he will be treated for hospital-acquired pneumonia.  He will need Accu- Cheks a.c. and h.s. with sliding scale coverage given his diabetes and the use of steroids.  I have talked to Dr. Shaune Pollack, pulmonologist, about him, and he has already seen him tonight.     Mila Homer. Sudie Bailey, M.D.     SDK/MEDQ  D:  01/12/2012  T:  01/12/2012  Job:  [id number]

## 2012-01-12 NOTE — ED Notes (Signed)
CRITICAL VALUE ALERT  Critical value received:  K+ 6.3, CO2 41, pH 7.23, PCO2 86.7, PO2 95.0, BiCarb 35.3, SO2 97  Date of notification:  01/12/12  Time of notification:  1640  Critical value read back:yes  Nurse who received alert:  Eustace Quail RN  MD notified (1st page):  Dr. Bebe Shaggy  Time of first page:  1640  MD notified (2nd page):  Time of second page:  Responding MD:    Time MD responded:

## 2012-01-12 NOTE — Consult Note (Signed)
ANTIBIOTIC CONSULT NOTE - INITIAL  Pharmacy Consult for vancomycin and zosyn Indication: pneumonia  No Known Allergies  Patient Measurements: Weight: 174 lb 2.6 oz (79 kg) (from BSA calculation from previous encounter)  Vital Signs: Temp: 100.4 F (38 C) (04/10 2000) Temp src: Other (Comment) (04/10 2000) BP: 92/67 mmHg (04/10 2000) Pulse Rate: 112  (04/10 2000) Intake/Output from previous day:   Intake/Output from this shift: Total I/O In: 50 [IV Piggyback:50] Out: -   Labs:  Basename 01/12/12 1531  WBC 13.0*  HGB 13.4  PLT 332  LABCREA --  CREATININE 0.70   The CrCl is unknown because both a height and weight (above a minimum accepted value) are required for this calculation. No results found for this basename: VANCOTROUGH:2,VANCOPEAK:2,VANCORANDOM:2,GENTTROUGH:2,GENTPEAK:2,GENTRANDOM:2,TOBRATROUGH:2,TOBRAPEAK:2,TOBRARND:2,AMIKACINPEAK:2,AMIKACINTROU:2,AMIKACIN:2, in the last 72 hours   Microbiology: Recent Results (from the past 720 hour(s))  MRSA PCR SCREENING     Status: Normal   Collection Time   01/12/12  6:27 PM      Component Value Range Status Comment   MRSA by PCR NEGATIVE  NEGATIVE  Final     Medical History: Past Medical History  Diagnosis Date  . COPD (chronic obstructive pulmonary disease)   . Asthma   . Hypertension   . Anxiety   . Diabetes mellitus    Assessment: Need ht and wt clarified  SCr ok  Goal of Therapy:  Eradicate infection. vanco level 15-20  Plan:  Vancomycin 1gm q12hrs Zosyn 3.375gm q8hrs Check vanco level at [employer], [place] A 01/12/2012,9:35 PM

## 2012-01-12 NOTE — ED Notes (Signed)
Sick call per EMS. Pt found lying flat on couch, not moving much air.  AMS.

## 2012-01-12 NOTE — ED Notes (Signed)
CRITICAL VALUE ALERT  Critical value received: ABG  Date of notification: 12/12/2011  Time of notification: 1527  Critical value read back yes  Nurse who received alert: Garrison Columbus, RN  MD notified (1st page): Dr. Bebe Shaggy  Time of first page:1528  MD notified (2nd page):  Time of second page:  Responding MD: Dr. Bebe Shaggy  Time MD responded:  [phone]

## 2012-01-12 NOTE — ED Provider Notes (Addendum)
History   This chart was scribed for Joya Gaskins, MD by Brooks Sailors. The patient was seen in room APA02/APA02. Patient's care was started at 1505.   CSN: [id number]  Arrival date & time 01/12/12  1505   First MD Initiated Contact with Patient 01/12/12 [phone]      Chief Complaint  Patient presents with  . Respiratory Distress     Level 5 Caveat due to patient condition.  HPI  Jeff Ray is a 56 y.o. male who presents to the Emergency Department BIB EMS for SOB. Patient was tachypneic and incontinent on arrival.   EMS reports he has h/o COPD, has had wheezing and trying nebs at home.  He did not improve and family called EMS.  EMS reports he was tachypneic but minimall responsive throughout transport.  He was given nebs en route.   No other details are known   Past Medical History  Diagnosis Date  . COPD (chronic obstructive pulmonary disease)   . Asthma   . Hypertension   . Anxiety   . Diabetes mellitus     Past Surgical History  Procedure Date  . Back surgery     No family history on file.  History  Substance Use Topics  . Smoking status: Current Everyday Smoker -- 1.0 packs/day for 10 years    Types: Cigarettes  . Smokeless tobacco: Not on file  . Alcohol Use: No      Review of Systems  Unable to perform ROS: Mental status change     Allergies  Review of patient's allergies indicates no known allergies.  Home Medications   Current Outpatient Rx  Name Route Sig Dispense Refill  . ALBUTEROL SULFATE HFA 108 (90 BASE) MCG/ACT IN AERS Inhalation Inhale 2 puffs into the lungs every 6 (six) hours as needed. Shortness of Breath    . AZITHROMYCIN 250 MG PO TABS Oral Take 1 tablet (250 mg total) by mouth daily. Take first 2 tablets together, then 1 every day until finished. 6 tablet 0  . CEFDINIR 300 MG PO CAPS Oral Take 300 mg by mouth 2 (two) times daily.    . GUAIFENESIN ER 600 MG PO TB12 Oral Take 1,200 mg by mouth daily. congestion    . IBUPROFEN  800 MG PO TABS Oral Take 800 mg by mouth daily as needed. For pain    . IPRATROPIUM-ALBUTEROL 0.5-2.5 (3) MG/3ML IN SOLN Nebulization Take 3 mLs by nebulization every 4 (four) hours as needed. For shortness of breath    . METFORMIN HCL 1000 MG PO TABS Oral Take 1 tablet (1,000 mg total) by mouth 2 (two) times daily. 60 tablet 5  . METHYLPREDNISOLONE 4 MG PO KIT Oral Take 4 mg by mouth as directed. follow package directions    . MONTELUKAST SODIUM 10 MG PO TABS Oral Take 10 mg by mouth at bedtime.      [name] PREDNISONE 10 MG PO TABS Oral Take 4 tablets (40 mg total) by mouth daily. 20 tablet 0    BP 192/91  Pulse 116  Resp 32  SpO2 96%  Physical Exam  Constitutional: Backboard and face mask in place.   CONSTITUTIONAL: Well developed/well nourished HEAD AND FACE: Normocephalic/atraumatic EYES: Pupils pinpoint bilaterally ENMT: Mucous membranes moist, nebs mask in place NECK: supple no meningeal signs SPINE:entire spine nontender CV: tachycardic S1/S2 noted, no murmurs/rubs/gallops noted LUNGS: minimal air movement,Tachypneic.  ABDOMEN: soft, nontender, no rebound or guarding MV:HQIONG genitalia, chaperone present NEURO: unresponsive, minimal response  to pain/voice.  Shallow breathing noted EXTREMITIES: no edema noted SKIN: warm, color normal PSYCH: unresponsive   ED Course  OG placement Date/Time: 01/12/2012 3:23 PM Performed by: Joya Gaskins Authorized by: Joya Gaskins Consent: The procedure was performed in an emergent situation. Time out: Immediately prior to procedure a "time out" was called to verify the correct patient, procedure, equipment, support staff and site/side marked as required. Patient sedated: yes Vitals: Vital signs were monitored during sedation. Patient tolerance: Patient tolerated the procedure well with no immediate complications. Comments: OG tube placed after intubation    CRITICAL CARE Performed by: Joya Gaskins   Total critical  care time: 50  Critical care time was exclusive of separately billable procedures and treating other patients.  Critical care was necessary to treat or prevent imminent or life-threatening deterioration.  Critical care was time spent personally by me on the following activities: development of treatment plan with patient and/or surrogate as well as nursing, discussions with consultants, evaluation of patient's response to treatment, examination of patient, obtaining history from patient or surrogate, ordering and performing treatments and interventions, ordering and review of laboratory studies, ordering and review of radiographic studies, pulse oximetry and re-evaluation of patient's condition.   EMERGENT INTUBATION PROCEDURE NOTE INDICATION: impending respiratory failure  TECHNIQUE: Unable to obtain consent because of altered level of consciousness.  After pre-oxygenating the patient for 5 minutes, a modified rapid-sequence induction was performed using etomidate and succinylcholine with cricoid pressure. Using a glidescope laryngoscope blade and 7.67mm cuffed endotracheal tube was placed and secured.  Placement was confirmed with by auscultation and by CXR.  COMPLICATIONS: None. The patient tolerated the procedure well with no complications. POST PROCEDURE CXR: pending    DIAGNOSTIC STUDIES:   COORDINATION OF CARE: 3:23 PM Pt arrived in distress, unresponsive, intubated immediately.  Suspect resp failure due to COPD Will follow closely 3:39 PM Hypercarbia noted Pt now starting to wake up Sedation ordered 4:14 PM D/w dr Sudie Bailey, on for [facility] Will admit to icu Pt cool to touch in all extremites, warming measures instituted  I spoke to family, they reports SOB/confusion for past 24 hrs, no vomiting, no falls 4:19 PM Pt currently on antibiotics at home, this is healthcare associated pneumonia  4:43 PM hyperK noted, insulin ordered Repeat ABG improved   Pt with continued  cool extremities though temp 98 Distal cap refill less than 3 seconds bilaterally.  Have had some difficulty obtaining pulse in left foot, though cap refill intact and similar in appearance to right foot.  I don't think this is an acute issue, will continue to warm extremities.  I discussed this with dr Sudie Bailey and he agrees with this  I suspect this is primary respiratory issue causing his AMS, and this somewhat improved after intubation.  Ct head deferred for now  Labs Reviewed  CBC  DIFFERENTIAL  BASIC METABOLIC PANEL  BLOOD GAS, ARTERIAL  PRO B NATRIURETIC PEPTIDE  CULTURE, BLOOD (ROUTINE X 2)  CULTURE, BLOOD (ROUTINE X 2)  LACTIC ACID, PLASMA        MDM  Nursing notes reviewed and considered in documentation Previous records reviewed and considered All labs/vitals reviewed and considered xrays reviewed and considered     Date: 01/12/2012  Rate: 108  Rhythm: sinus tachycardia  QRS Axis: left  Intervals: normal  ST/T Wave abnormalities: nonspecific ST changes  Conduction Disutrbances:right bundle branch block  Narrative Interpretation:   Old EKG Reviewed: unchanged     I personally performed the  services described in this documentation, which was scribed in my presence. The recorded information has been reviewed and considered.      Joya Gaskins, MD 01/12/12 1645  Joya Gaskins, MD 01/12/12 [phone]

## 2012-01-13 ENCOUNTER — Inpatient Hospital Stay (HOSPITAL_COMMUNITY): Payer: Medicare HMO

## 2012-01-13 ENCOUNTER — Encounter (HOSPITAL_COMMUNITY): Payer: Self-pay | Admitting: *Deleted

## 2012-01-13 LAB — GLUCOSE, CAPILLARY
Glucose-Capillary: 274 mg/dL — ABNORMAL HIGH (ref 70–99)
Glucose-Capillary: 208 mg/dL — ABNORMAL HIGH (ref 70–99)
Glucose-Capillary: 200 mg/dL — ABNORMAL HIGH (ref 70–99)

## 2012-01-13 LAB — BLOOD GAS, ARTERIAL
Drawn by: 21694
O2 Saturation: 96.2 %
PEEP: 5 cmH2O
Patient temperature: 37
RATE: 14 resp/min
pH, Arterial: 7.367 (ref 7.350–7.450)
pO2, Arterial: 77.1 mmHg — ABNORMAL LOW (ref 80.0–100.0)

## 2012-01-13 LAB — DIFFERENTIAL
Basophils Absolute: 0 10*3/uL (ref 0.0–0.1)
Basophils Relative: 0 % (ref 0–1)
Eosinophils Absolute: 0 10*3/uL (ref 0.0–0.7)
Neutro Abs: 11.7 10*3/uL — ABNORMAL HIGH (ref 1.7–7.7)
Neutrophils Relative %: 93 % — ABNORMAL HIGH (ref 43–77)
WBC Morphology: INCREASED

## 2012-01-13 LAB — BASIC METABOLIC PANEL
Calcium: 10 mg/dL (ref 8.4–10.5)
GFR calc Af Amer: >90 mL/min (ref >90)
GFR calc non Af Amer: >90 mL/min (ref >90)
Glucose, Bld: 283 mg/dL — ABNORMAL HIGH (ref 70–99)
Sodium: 132 mEq/L — ABNORMAL LOW (ref 135–145)

## 2012-01-13 LAB — CBC
MCH: 29.1 pg (ref 26.0–34.0)
MCHC: 31.1 g/dL (ref 30.0–36.0)
Platelets: 271 10*3/uL (ref 150–400)
RDW: 14 % (ref 11.5–15.5)

## 2012-01-13 LAB — URINE CULTURE
Colony Count: NO GROWTH
Culture: NO GROWTH

## 2012-01-13 MED ORDER — SODIUM CHLORIDE 0.9 % IJ SOLN
INTRAMUSCULAR | Status: AC
  Filled 2012-01-13: qty 3

## 2012-01-13 MED ORDER — DOXYCYCLINE HYCLATE 100 MG IV SOLR
INTRAVENOUS | Status: AC
  Filled 2012-01-13: qty 100

## 2012-01-13 MED ORDER — ENOXAPARIN SODIUM 40 MG/0.4ML ~~LOC~~ SOLN
40.0000 mg | SUBCUTANEOUS | Status: DC
  Administered 2012-01-13 – 2012-01-17 (×5): 40 mg via SUBCUTANEOUS
  Filled 2012-01-13 (×5): qty 0.4

## 2012-01-13 NOTE — Consult Note (Signed)
ANTIBIOTIC CONSULT NOTE   Pharmacy Consult for vancomycin and zosyn Indication: pneumonia  No Known Allergies  Patient Measurements: Weight: 165 lb 9.1 oz (75.1 kg)  Vital Signs: Temp: 99.1 F (37.3 C) (04/11 0800) Temp src: Core (Comment) (04/11 0800) BP: 111/74 mmHg (04/11 0800) Pulse Rate: 99  (04/11 0800) Intake/Output from previous day: 04/10 0701 - 04/11 0700 In: 977.6 [I.V.:127.6; IV Piggyback:850] Out: 250 [Urine:250] Intake/Output from this shift:    Labs:  [facility] 01/13/12 0542 01/12/12 1531  WBC 12.6* 13.0*  HGB 11.0* 13.4  PLT 271 332  LABCREA -- --  CREATININE 0.94 0.70   The CrCl is unknown because both a height and weight (above a minimum accepted value) are required for this calculation. No results found for this basename: VANCOTROUGH:2,VANCOPEAK:2,VANCORANDOM:2,GENTTROUGH:2,GENTPEAK:2,GENTRANDOM:2,TOBRATROUGH:2,TOBRAPEAK:2,TOBRARND:2,AMIKACINPEAK:2,AMIKACINTROU:2,AMIKACIN:2, in the last 72 hours   Microbiology: Recent Results (from the past 720 hour(s))  CULTURE, BLOOD (ROUTINE X 2)     Status: Normal (Preliminary result)   Collection Time   01/12/12  3:31 PM      Component Value Range Status Comment   Specimen Description BLOOD RIGHT ANTECUBITAL   Final    Special Requests BOTTLES DRAWN AEROBIC AND ANAEROBIC 5CC   Final    Culture NO GROWTH 1 DAY   Final    Report Status PENDING   Incomplete   CULTURE, BLOOD (ROUTINE X 2)     Status: Normal (Preliminary result)   Collection Time   01/12/12  3:44 PM      Component Value Range Status Comment   Specimen Description BLOOD RIGHT FOREARM   Final    Special Requests BOTTLES DRAWN AEROBIC AND ANAEROBIC 4CC   Final    Culture NO GROWTH 1 DAY   Final    Report Status PENDING   Incomplete   MRSA PCR SCREENING     Status: Normal   Collection Time   01/12/12  6:27 PM      Component Value Range Status Comment   MRSA by PCR NEGATIVE  NEGATIVE  Final    Medical History: Past Medical History  Diagnosis Date   . COPD (chronic obstructive pulmonary disease)   . Asthma   . Hypertension   . Anxiety   . Diabetes mellitus    Assessment: Need HEIGHT clarified  SCr ok  Goal of Therapy:  Eradicate infection. vanco level 15-20  Plan:  Vancomycin 1gm q12hrs Zosyn 3.375gm q8hrs Check vanco trough level tomorrow  Margo Aye, Keyia More[place] A 01/13/2012,11:15 AM

## 2012-01-13 NOTE — Progress Notes (Signed)
NAME:  Jeff Ray, Jeff Ray                 ACCOUNT NO.:  [id number]  MEDICAL RECORD NO.:  [id number]  LOCATION:  IC05                          FACILITY:  [facility]  PHYSICIAN:  Jolanda Mccann L. Juanetta Gosling, M.D.DATE OF BIRTH:  05-17-1956  DATE OF PROCEDURE: DATE OF DISCHARGE:                                PROGRESS NOTE   Jeff Ray was admitted yesterday with respiratory failure, and he is intubated and on the ventilator.  He is sedated and unresponsive at this point.  No new problems have been noted by the nursing staff.  PHYSICAL EXAMINATION:  GENERAL:  Shows that he is intubated, sedated. VITAL SIGNS:  Temperature is 99, pulse 100, respirations 14, blood pressure 124/68, O2 sats 98%. CHEST:  Shows decreased breath sounds. HEART:  Regular. ABDOMEN:  Soft.  His laboratory work shows his white blood count is 12,600, hemoglobin is 11.9, sodium is 132, and his blood gas shows a pH 7.36, pCO2 of 67, pO2 of 77.  He has what may be pneumonia on chest x-ray.  ASSESSMENT:  He has respiratory failure, which is improving.  He has chronic obstructive pulmonary disease.  Overall, I think he is somewhat better.  He is on antibiotic coverage, steroids, inhaled bronchodilators, and I do not think there is anything to add at this point.  I will see if he can do any weaning today.  It is not clear that he is ready.  He does have a lot of secretions if he cannot wean today, of course will make an attempt in the morning.     Alieah Brinton L. Juanetta Gosling, M.D.     ELH/MEDQ  D:  01/13/2012  T:  01/13/2012  Job:  [id number]

## 2012-01-13 NOTE — Progress Notes (Signed)
NAME:  Jeff Ray, Jeff Ray                 ACCOUNT NO.:  [id number]  MEDICAL RECORD NO.:  [id number]  LOCATION:  IC05                          FACILITY:  [facility]  PHYSICIAN:  Dorine Duffey G. Renard Matter, MD   DATE OF BIRTH:  1956/02/12  DATE OF PROCEDURE: DATE OF DISCHARGE:                                PROGRESS NOTE   This patient was brought in through the emergency room with flare-up of COPD because of respiratory failure.  He was intubated and placed in ICU.  Apparently, his pCO2 was 86 and now is 67.  He did have x-ray evidence of pneumonia right upper lobe.  He is more comfortable this a.m.  OBJECTIVE:  VITAL SIGNS:  Blood pressure 109/70, pulse rate 100. LUNGS:  Diminished breath sounds. HEART:  Regular rhythm. ABDOMEN:  No palpable organs or masses. EXTREMITIES:  Free of edema.  ASSESSMENT:  The patient had respiratory arrest, chronic obstructive pulmonary disease, type 2 diabetes, benign essential hypertension and pneumonia.  PLAN:  To continue IV antibiotics vancomycin 1000 mg q.12 hours and Zosyn 3.375 g every 8 hours.  The patient is being seen as well by Dr. Juanetta Gosling.  We will continue to receive nebulizer treatments.  Continue to monitor blood sugars.  Continue IV steroids and Solu-Medrol.     Rain Wilhide G. Renard Matter, MD     AGM/MEDQ  D:  01/13/2012  T:  01/13/2012  Job:  [id number]

## 2012-01-13 NOTE — Progress Notes (Addendum)
CRITICAL VALUE ALERT  Critical value received:  pCO2 67.9 on ABG  Date of notification:  01/13/12   Time of notification:  0450am  Critical value read back:yes  Nurse who received alert: Leanna Battles, RN   MD notified (1st page):  n/a  Time of first page:  n/a  MD notified (2nd page):  Time of second page:  Responding MD:  n/a   Time MD responded:  N/a   Previous pCO2 value on 01/12/12 at 1620pm was 86.7 so value is actually improving even though it is still high.  MD not called since pt already intubated on ventilator and value improved from prior value.  01/13/12 @ 0617am:  Dr. Renard Matter in room so notified him of ABG results with pCO2 67.9.  No new orders.

## 2012-01-13 NOTE — Progress Notes (Signed)
UR Chart Review Completed  

## 2012-01-13 NOTE — Progress Notes (Signed)
NAME:  Jeff Ray, SLINKER                 ACCOUNT NO.:  [id number]  MEDICAL RECORD NO.:  [id number]  LOCATION:                                 FACILITY:  PHYSICIAN:  Hallelujah Wysong L. Juanetta Gosling, M.D.DATE OF BIRTH:  Jun 24, 1956  DATE OF PROCEDURE:  01/12/2012 DATE OF DISCHARGE:                                PROGRESS NOTE   Consult from Dr. Renard Matter.  REASON FOR CONSULTATION:  Respiratory failure.  HISTORY:  Mr. Trentman is a 56 year old who had been in his usual state of fairly poor health at home when he developed shortness of breath.  His shortness of breath worsened over the last several days to the point that he came to the emergency room.  When he was seen in the emergency room, he was found to be in some respiratory distress, and he continued to have difficulty and eventually was intubated.  He is now in the intensive care unit.  He has a history of asthma/COPD, CAD, hypertension, anxiety, and diabetes.  He has had previous back surgery.  His family history is unknown and cannot be obtained from him because he is intubated.  SOCIAL HISTORY:  He has about a 30 pack-year smoking history and apparently is still smoking now.  His review of systems cannot be obtained because he is intubated and on the ventilator.  PHYSICAL EXAMINATION:  GENERAL:  Shows a well-developed, well-nourished male who is in no acute distress.  He is intubated and sedated. HEENT:  His pupils are reactive.  His nose and throat are clear. NECK:  Supple. CHEST:  Shows decreased breath sounds and expiratory wheezes. HEART:  Regular without gallop. ABDOMEN:  Soft without any masses.  He does not have any edema of the extremities. CENTRAL NERVOUS SYSTEM:  He has been moving all 4's, but he is now intubated on the ventilator.  LABORATORY WORK:  His pO2 is 95, pCO2 of 86, pH 7.23 on 50% rate of 500. His potassium was 6.3, which has been treated.  Chest x-ray shows what is probably a pneumonia.  My assessment then is  that he has respiratory failure, multifactorial based on his chronic obstructive pulmonary disease, his asthmatic component and probable pneumonia.  He will be treated with antibiotics, steroids, inhaled bronchodilators, and remained on the ventilator for now.     Jenny Lai L. Juanetta Gosling, M.D.     ELH/MEDQ  D:  01/12/2012  T:  01/12/2012  Job:  [id number]

## 2012-01-13 NOTE — Progress Notes (Signed)
[id number] 

## 2012-01-13 NOTE — Progress Notes (Signed)
Pt was not weaned today due to being intubated less than 24 hours and he has a large amount of yellow secretions.

## 2012-01-14 LAB — BLOOD GAS, ARTERIAL
Bicarbonate: 38.4 mEq/L — ABNORMAL HIGH (ref 20.0–24.0)
FIO2: 40 %
O2 Saturation: 96.7 %
Pressure support: 10 cmH2O
pO2, Arterial: 89.6 mmHg (ref 80.0–100.0)

## 2012-01-14 LAB — GLUCOSE, CAPILLARY
Glucose-Capillary: 234 mg/dL — ABNORMAL HIGH (ref 70–99)
Glucose-Capillary: 237 mg/dL — ABNORMAL HIGH (ref 70–99)

## 2012-01-14 MED ORDER — INSULIN ASPART 100 UNIT/ML ~~LOC~~ SOLN
0.0000 [IU] | SUBCUTANEOUS | Status: DC
  Administered 2012-01-14: 5 [IU] via SUBCUTANEOUS
  Administered 2012-01-14: 1 [IU] via SUBCUTANEOUS
  Administered 2012-01-14: 2 [IU] via SUBCUTANEOUS
  Administered 2012-01-14: 3 [IU] via SUBCUTANEOUS
  Administered 2012-01-15: 2 [IU] via SUBCUTANEOUS
  Administered 2012-01-15: 3 [IU] via SUBCUTANEOUS

## 2012-01-14 MED ORDER — DOXYCYCLINE HYCLATE 100 MG IV SOLR
INTRAVENOUS | Status: AC
  Filled 2012-01-14: qty 100

## 2012-01-14 MED ORDER — VANCOMYCIN HCL 1000 MG IV SOLR
1250.0000 mg | Freq: Two times a day (BID) | INTRAVENOUS | Status: DC
  Administered 2012-01-14 – 2012-01-18 (×7): 1250 mg via INTRAVENOUS
  Filled 2012-01-14 (×10): qty 1250

## 2012-01-14 MED ORDER — SODIUM CHLORIDE 0.9 % IJ SOLN
INTRAMUSCULAR | Status: AC
  Filled 2012-01-14: qty 3

## 2012-01-14 NOTE — Progress Notes (Signed)
Subjective: He remains intubated on the ventilator. Yesterday he had a lot of secretions which kept [place] from any effective weaning. Today his secretions seen in the last and he generally seems to be doing better. He has no new complaints.  Objective: Vital signs in last 24 hours: Temp:  [97.9 F (36.6 C)-99.8 F (37.7 C)] 98.5 F (36.9 C) (04/12 0700) Pulse Rate:  [87-105] 89  (04/12 0700) Resp:  [0-38] 2  (04/12 0700) BP: (94-137)/(55-75) 111/62 mmHg (04/12 0700) SpO2:  [95 %-98 %] 96 % (04/12 0703) FiO2 (%):  [39.8 %-40.5 %] 40 % (04/12 0703) Weight:  [74.6 kg (164 lb 7.4 oz)] 74.6 kg (164 lb 7.4 oz) (04/12 0500) Weight change: -4.4 kg (-9 lb 11.2 oz) Last BM Date: 01/11/12  Intake/Output from previous day: 04/11 0701 - 04/12 0700 In: 1233.8 [I.V.:233.8; IV Piggyback:1000] Out: 2050 [Urine:2050]  PHYSICAL EXAM General appearance: no distress and Intubated and sedated and undergoing wakeup assessment Resp: diminished breath sounds bilaterally Cardio: regular rate and rhythm, S1, S2 normal, no murmur, click, rub or gallop GI: soft, non-tender; bowel sounds normal; no masses,  no organomegaly Extremities: extremities normal, atraumatic, no cyanosis or edema  Lab Results:    Basic Metabolic Panel:  Basename 01/13/12 0542 01/12/12 1531  NA 132* 135  K 4.5 6.3*  CL 90* 90*  CO2 37* 41*  GLUCOSE 283* 195*  BUN 17 10  CREATININE 0.94 0.70  CALCIUM 10.0 10.4  MG -- --  PHOS -- --   Liver Function Tests: No results found for this basename: AST:2,ALT:2,ALKPHOS:2,BILITOT:2,PROT:2,ALBUMIN:2 in the last 72 hours No results found for this basename: LIPASE:2,AMYLASE:2 in the last 72 hours No results found for this basename: AMMONIA:2 in the last 72 hours CBC:  Basename 01/13/12 0542 01/12/12 1531  WBC 12.6* 13.0*  NEUTROABS 11.7* 11.0*  HGB 11.0* 13.4  HCT 35.4* 44.4  MCV 93.7 96.7  PLT 271 332   Cardiac Enzymes: No results found for this basename:  CKTOTAL:3,CKMB:3,CKMBINDEX:3,TROPONINI:3 in the last 72 hours BNP:  Basename 01/12/12 1531  PROBNP 197.0*   D-Dimer: No results found for this basename: DDIMER:2 in the last 72 hours CBG:  Basename 01/14/12 0404 01/14/12 01/13/12 1953 01/13/12 1542 01/13/12 1151 01/13/12 0724  GLUCAP 202* 234* 230* 200* 208* 274*   Hemoglobin A1C: No results found for this basename: HGBA1C in the last 72 hours Fasting Lipid Panel: No results found for this basename: CHOL,HDL,LDLCALC,TRIG,CHOLHDL,LDLDIRECT in the last 72 hours Thyroid Function Tests: No results found for this basename: TSH,T4TOTAL,FREET4,T3FREE,THYROIDAB in the last 72 hours Anemia Panel: No results found for this basename: VITAMINB12,FOLATE,FERRITIN,TIBC,IRON,RETICCTPCT in the last 72 hours Coagulation: No results found for this basename: LABPROT:2,INR:2 in the last 72 hours Urine Drug Screen: Drugs of Abuse  No results found for this basename: labopia, cocainscrnur, labbenz, amphetmu, thcu, labbarb    Alcohol Level: No results found for this basename: ETH:2 in the last 72 hours Urinalysis: No results found for this basename: COLORURINE:2,APPERANCEUR:2,LABSPEC:2,PHURINE:2,GLUCOSEU:2,HGBUR:2,BILIRUBINUR:2,KETONESUR:2,PROTEINUR:2,UROBILINOGEN:2,NITRITE:2,LEUKOCYTESUR:2 in the last 72 hours Misc. Labs:  ABGS  Basename 01/13/12 0438  PHART 7.367  PO2ART 77.1*  TCO2 35.1  HCO3 38.0*   CULTURES Recent Results (from the past 240 hour(s))  CULTURE, BLOOD (ROUTINE X 2)     Status: Normal (Preliminary result)   Collection Time   01/12/12  3:31 PM      Component Value Range Status Comment   Specimen Description BLOOD RIGHT ANTECUBITAL   Final    Special Requests BOTTLES DRAWN AEROBIC AND ANAEROBIC 5CC  Final    Culture NO GROWTH 2 DAYS   Final    Report Status PENDING   Incomplete   CULTURE, BLOOD (ROUTINE X 2)     Status: Normal (Preliminary result)   Collection Time   01/12/12  3:44 PM      Component Value Range Status  Comment   Specimen Description BLOOD RIGHT FOREARM   Final    Special Requests BOTTLES DRAWN AEROBIC AND ANAEROBIC 4CC   Final    Culture NO GROWTH 2 DAYS   Final    Report Status PENDING   Incomplete   URINE CULTURE     Status: Normal   Collection Time   01/12/12  6:07 PM      Component Value Range Status Comment   Specimen Description URINE, CATHETERIZED   Final    Special Requests NONE   Final    Culture  Setup Time [id number]   Final    Colony Count NO GROWTH   Final    Culture NO GROWTH   Final    Report Status 01/13/2012 FINAL   Final   MRSA PCR SCREENING     Status: Normal   Collection Time   01/12/12  6:27 PM      Component Value Range Status Comment   MRSA by PCR NEGATIVE  NEGATIVE  Final    Studies/Results: Dg Chest Port 1 View  01/13/2012  *RADIOLOGY REPORT*  Clinical Data: Respiratory failure.  PORTABLE CHEST - 1 VIEW  Comparison: 01/12/2012.  Findings: Endotracheal tube is in satisfactory position. Nasogastric tube is followed into the stomach.  Heart size normal.  Lungs are emphysematous with mild bibasilar air space disease, stable.  Difficult to exclude a small nodular density in the left mid lung zone.  Left costophrenic angle was not included on the film.  No definite right pleural fluid.  IMPRESSION:  1.  Emphysema with residual bibasilar air space disease. 2.  Small nodular density in the left midlung zone.  Continued attention on follow-up exams is warranted.  Original Report Authenticated By: Reyes Ivan, M.D.   Dg Chest Port 1 View  01/12/2012  *RADIOLOGY REPORT*  Clinical Data: Shortness of breath.  Endotracheal tube placement.  PORTABLE CHEST - 1 VIEW  Comparison: 01/10/2012  Findings: Endotracheal tube is 5 cm above the carina. There is hyperinflation of the lungs compatible with COPD.  New patchy opacities in the right upper lobe and both lung bases.  Heart is normal size.  No visible effusions.  NG tube has been placed into the stomach.  IMPRESSION:  Endotracheal tube 5 cm above the carina.  Patchy right upper lobe and bibasilar airspace opacities.  Cannot exclude pneumonia.  Original Report Authenticated By: Cyndie Chime, M.D.    Medications:  Scheduled:   . albuterol  2.5 mg Nebulization Q4H   And  . ipratropium  0.5 mg Nebulization Q4H  . chlorhexidine  15 mL Mouth/Throat BID  . dextrose  1 ampule Intravenous Once  . doxycycline (VIBRAMYCIN) IV  100 mg Intravenous Q12H  . enoxaparin (LOVENOX) injection  40 mg Subcutaneous Q24H  . insulin aspart  0-9 Units Subcutaneous Q4H  . methylPREDNISolone (SOLU-MEDROL) injection  125 mg Intravenous Q6H  . pantoprazole (PROTONIX) IV  40 mg Intravenous QHS  . piperacillin-tazobactam (ZOSYN)  IV  3.375 g Intravenous Q8H  . sodium chloride      . sodium chloride      . vancomycin  1,000 mg Intravenous Q12H  . DISCONTD: insulin  aspart  0-9 Units Subcutaneous TID WC   Continuous:   . propofol 30 mcg/kg/min (01/13/12 1933)   PRN:  Assesment: He has acute respiratory failure requiring ventilator support. He has a pneumonia. He has end-stage COPD. Active Problems:  * No active hospital problems. *     Plan: Weaning will be attempted today    LOS: 2 days   Kemberly Taves L 01/14/2012, 7:55 AM

## 2012-01-14 NOTE — Progress Notes (Signed)
Inpatient Diabetes Program Recommendations  AACE/ADA: New Consensus Statement on Inpatient Glycemic Control  Target Ranges:  Prepandial:   less than 140 mg/dL      Peak postprandial:   less than 180 mg/dL (1-2 hours)      Critically ill patients:  140 - 180 mg/dL  Pager:  [phone] Hours:  8 am-10pm   Reason for Visit: Steroid-Induced Hyperglycemia  Results for Jeff Ray, Jeff Ray (MRN [id number]) as of 01/14/2012 11:01  Ref. Range 01/13/2012 15:42 01/13/2012 19:53 01/14/2012 00:00 01/14/2012 04:04 01/14/2012 08:04  Glucose-Capillary Latest Range: 70-99 mg/dL 962 (H) 952 (H) 841 (H) 202 (H) 237 (H)    Inpatient Diabetes Program Recommendations Insulin - Basal: Add Lantus 15 units daily while on IV steroids

## 2012-01-14 NOTE — Consult Note (Signed)
ANTIBIOTIC CONSULT NOTE   Pharmacy Consult for vancomycin and zosyn Indication: pneumonia  No Known Allergies  Patient Measurements: Weight: 164 lb 7.4 oz (74.6 kg)  Vital Signs: Temp: 98.8 F (37.1 C) (04/12 0900) Temp src: Oral (04/12 0800) BP: 130/75 mmHg (04/12 0900) Pulse Rate: 93  (04/12 0900) Intake/Output from previous day: 04/11 0701 - 04/12 0700 In: 1248 [I.V.:248; IV Piggyback:1000] Out: 2050 [Urine:2050] Intake/Output from this shift:    Labs:  [facility] 01/13/12 0542 01/12/12 1531  WBC 12.6* 13.0*  HGB 11.0* 13.4  PLT 271 332  LABCREA -- --  CREATININE 0.94 0.70   The CrCl is unknown because both a height and weight (above a minimum accepted value) are required for this calculation.  Basename 01/14/12 0948  VANCOTROUGH 8.6*  VANCOPEAK --  Drue Dun --  GENTTROUGH --  GENTPEAK --  GENTRANDOM --  TOBRATROUGH --  TOBRAPEAK --  TOBRARND --  AMIKACINPEAK --  AMIKACINTROU --  AMIKACIN --     Microbiology: Recent Results (from the past 720 hour(s))  CULTURE, BLOOD (ROUTINE X 2)     Status: Normal (Preliminary result)   Collection Time   01/12/12  3:31 PM      Component Value Range Status Comment   Specimen Description BLOOD RIGHT ANTECUBITAL   Final    Special Requests BOTTLES DRAWN AEROBIC AND ANAEROBIC 5CC   Final    Culture NO GROWTH 2 DAYS   Final    Report Status PENDING   Incomplete   CULTURE, BLOOD (ROUTINE X 2)     Status: Normal (Preliminary result)   Collection Time   01/12/12  3:44 PM      Component Value Range Status Comment   Specimen Description BLOOD RIGHT FOREARM   Final    Special Requests BOTTLES DRAWN AEROBIC AND ANAEROBIC 4CC   Final    Culture NO GROWTH 2 DAYS   Final    Report Status PENDING   Incomplete   URINE CULTURE     Status: Normal   Collection Time   01/12/12  6:07 PM      Component Value Range Status Comment   Specimen Description URINE, CATHETERIZED   Final    Special Requests NONE   Final    Culture  Setup  Time [id number]   Final    Colony Count NO GROWTH   Final    Culture NO GROWTH   Final    Report Status 01/13/2012 FINAL   Final   MRSA PCR SCREENING     Status: Normal   Collection Time   01/12/12  6:27 PM      Component Value Range Status Comment   MRSA by PCR NEGATIVE  NEGATIVE  Final    Medical History: Past Medical History  Diagnosis Date  . COPD (chronic obstructive pulmonary disease)   . Asthma   . Hypertension   . Anxiety   . Diabetes mellitus    Anti-infectives     Start     Dose/Rate Route Frequency Ordered Stop   01/14/12 2200   vancomycin (VANCOCIN) 1,250 mg in sodium chloride 0.9 % 250 mL IVPB        1,250 mg 166.7 mL/hr over 90 Minutes Intravenous Every 12 hours 01/14/12 1141     01/12/12 2200   vancomycin (VANCOCIN) IVPB 1000 mg/200 mL premix  Status:  Discontinued        1,000 mg 200 mL/hr over 60 Minutes Intravenous Every 12 hours 01/12/12 2133 01/14/12 1141   01/12/12  2200  piperacillin-tazobactam (ZOSYN) IVPB 3.375 g       3.375 g 12.5 mL/hr over 240 Minutes Intravenous Every 8 hours 01/12/12 2133     01/12/12 2000   azithromycin (ZITHROMAX) 500 mg in dextrose 5 % 250 mL IVPB  Status:  Discontinued        500 mg 250 mL/hr over 60 Minutes Intravenous Every 24 hours 01/12/12 1839 01/12/12 2041   01/12/12 1930   cefTRIAXone (ROCEPHIN) 1 g in dextrose 5 % 50 mL IVPB  Status:  Discontinued        1 g 100 mL/hr over 30 Minutes Intravenous Every 24 hours 01/12/12 1839 01/12/12 2041   01/12/12 1630   doxycycline (VIBRAMYCIN) 100 mg in dextrose 5 % 250 mL IVPB        100 mg 125 mL/hr over 120 Minutes Intravenous Every 12 hours 01/12/12 1550           Assessment: SCr ok Trough below goal.   Goal of Therapy:  Eradicate infection. vanco level 15-20  Plan:  Increase Vancomycin to 1250mg  IV every 12 hours. Zosyn 3.375gm q8hrs  Mady Gemma 01/14/2012,11:37 AM

## 2012-01-14 NOTE — Progress Notes (Signed)
NAME:  Jeff Ray, Jeff Ray                 ACCOUNT NO.:  [id number]  MEDICAL RECORD NO.:  [id number]  LOCATION:  IC05                          FACILITY:  [facility]  PHYSICIAN:  Raneem Mendolia G. Renard Matter, MD   DATE OF BIRTH:  Jan 04, 1956  DATE OF PROCEDURE: DATE OF DISCHARGE:                                PROGRESS NOTE   This patient has end-stage COPD, was admitted with respiratory distress and hypoxia and had to be intubated.  OBJECTIVE:  VITAL SIGNS:  Blood pressure 106/65, pulse 93, temp 98.8. LUNGS:  The patient has diminished breath sounds. HEART:  Regular rhythm. ABDOMEN:  No palpable organs or masses.  The patient's current blood gases showed a pH of 7.360, pCO2 69.9, pO2 77.1,  ASSESSMENT:  The patient does have chronic obstructive pulmonary disease, respiratory distress recently and community-acquired pneumonia with hypoxia and hypercapnia.  PLAN:  To continue current regimen.  Will remain on antibiotics and inhaled bronchodilators, and remain on ventilator.     Marvel Mcphillips G. Renard Matter, MD     AGM/MEDQ  D:  01/14/2012  T:  01/14/2012  Job:  [id number]

## 2012-01-14 NOTE — Progress Notes (Signed)
   CARE MANAGEMENT NOTE 01/14/2012  Patient:  Jeff Ray, Jeff Ray   Account Number:  [id number]  Date Initiated:  01/14/2012  Documentation initiated by:  Sharrie Rothman  Subjective/Objective Assessment:   Pt admitted with pneumonia. Pt remains on ventilator. Pt lives with wife.     Action/Plan:   May need HH at discharge. Will continue to follow for CM needs.   Anticipated DC Date:  01/19/2012   Anticipated DC Plan:  HOME W HOME HEALTH SERVICES      DC Planning Services  CM consult      Choice offered to / List presented to:             Status of service:  In process, will continue to follow Medicare Important Message given?   (If response is "NO", the following Medicare IM given date fields will be blank) Date Medicare IM given:   Date Additional Medicare IM given:    Discharge Disposition:  HOME W HOME HEALTH SERVICES  Per UR Regulation:    If discussed at Long Length of Stay Meetings, dates discussed:    Comments:  01/14/12 1630 Arlyss Queen, RN BSN CM

## 2012-01-15 LAB — GLUCOSE, CAPILLARY
Glucose-Capillary: 190 mg/dL — ABNORMAL HIGH (ref 70–99)
Glucose-Capillary: 179 mg/dL — ABNORMAL HIGH (ref 70–99)
Glucose-Capillary: 262 mg/dL — ABNORMAL HIGH (ref 70–99)

## 2012-01-15 MED ORDER — METHYLPREDNISOLONE SODIUM SUCC 125 MG IJ SOLR
80.0000 mg | Freq: Four times a day (QID) | INTRAMUSCULAR | Status: DC
  Administered 2012-01-15 – 2012-01-17 (×11): 80 mg via INTRAVENOUS
  Filled 2012-01-15 (×10): qty 2

## 2012-01-15 MED ORDER — INSULIN ASPART 100 UNIT/ML ~~LOC~~ SOLN
0.0000 [IU] | Freq: Three times a day (TID) | SUBCUTANEOUS | Status: DC
  Administered 2012-01-15 (×2): 4 [IU] via SUBCUTANEOUS
  Administered 2012-01-16: 3 [IU] via SUBCUTANEOUS
  Administered 2012-01-16: 7 [IU] via SUBCUTANEOUS
  Administered 2012-01-17: 4 [IU] via SUBCUTANEOUS
  Administered 2012-01-17: 11 [IU] via SUBCUTANEOUS
  Administered 2012-01-17: 7 [IU] via SUBCUTANEOUS
  Administered 2012-01-18: 4 [IU] via SUBCUTANEOUS

## 2012-01-15 MED ORDER — INSULIN ASPART 100 UNIT/ML ~~LOC~~ SOLN
0.0000 [IU] | Freq: Every day | SUBCUTANEOUS | Status: DC
  Administered 2012-01-15 – 2012-01-17 (×3): 3 [IU] via SUBCUTANEOUS

## 2012-01-15 NOTE — Progress Notes (Signed)
Pt TRANSFERING TO ROOM 306 ON TELEMETRY. NO RESPIRATORY DISTRESS. COUGH PRODUCTIVE OF THICK YELLOW SPUTUM.ALERT AND ORIENTED. LT FA IV PATENT. VOIDING W/O DIFFICULTY. REPORT CALLED TO ASHLEY.

## 2012-01-15 NOTE — Progress Notes (Signed)
Placed pt on BIPAP 12/5 with 3lpm/[place] bled in. He wore it for a few minutes and decided to stop. He has a CPAP at home which he does not use. Looking at his past blood gases I doubt if BIPAP will help him except as a rescue device. His PCO2 runs high. High 60's to low 70"s. He most likely needs another sleep study but I doubt it would be any value. As he is non compliant for the most part.

## 2012-01-15 NOTE — Progress Notes (Signed)
NAME:  CELESTINO, ACKERMAN                 ACCOUNT NO.:  [id number]  MEDICAL RECORD NO.:  [id number]  LOCATION:  IC05                          FACILITY:  [facility]  PHYSICIAN:  Lorilei Horan G. Renard Matter, MD   DATE OF BIRTH:  [birth year]  DATE OF PROCEDURE: DATE OF DISCHARGE:                                PROGRESS NOTE   This patient was extubated yesterday successfully .  He is more comfortable.  He does have chronic obstructive pulmonary disease with respiratory distress, community-acquired pneumonia with hypoxia and hypercapnia.  OBJECTIVE:  VITAL SIGNS:  Blood pressure 140/78, respirations 27, pulse 80, temp 97.6. LUNGS:  Diminished breath sounds. HEART:  Regular rhythm. ABDOMEN:  No palpable organs or masses.  Most recent blood gases, pH 7.377, pCO2 of 67.0, pO2 of 89.6.  ASSESSMENT:  The patient does have chronic obstructive pulmonary disease, was admitted with respiratory distress, hypercapnia and hypoxia and community-acquired pneumonia.  PLAN:  To continue current regimen.  Will remain on IV antibiotics, inhaled bronchodilators.  Antibiotic regimen is IV Zosyn 3.375 g every 8 hours and vancomycin 1250 mg IV q.12 hours.     Agripina Guyette G. Renard Matter, MD     AGM/MEDQ  D:  01/15/2012  T:  01/15/2012  Job:  [id number]

## 2012-01-16 LAB — BASIC METABOLIC PANEL
CO2: 40 mEq/L (ref 19–32)
GFR calc non Af Amer: >90 mL/min (ref >90)
Glucose, Bld: 273 mg/dL — ABNORMAL HIGH (ref 70–99)
Potassium: 4.8 mEq/L (ref 3.5–5.1)
Sodium: 137 mEq/L (ref 135–145)

## 2012-01-16 LAB — GLUCOSE, CAPILLARY: Glucose-Capillary: 294 mg/dL — ABNORMAL HIGH (ref 70–99)

## 2012-01-16 MED ORDER — SODIUM CHLORIDE 0.9 % IJ SOLN
INTRAMUSCULAR | Status: AC
  Administered 2012-01-16: 10 mL
  Filled 2012-01-16: qty 3

## 2012-01-16 NOTE — Progress Notes (Signed)
[id number] 

## 2012-01-16 NOTE — Progress Notes (Signed)
NAME:  Jeff Ray, Jeff Ray NO.:  [id number]  MEDICAL RECORD NO.:  [id number]  LOCATION:  A306                          FACILITY:  [facility]  PHYSICIAN:  Melvyn Novas, MDDATE OF BIRTH:  04-23-1956  DATE OF PROCEDURE: DATE OF DISCHARGE:                                PROGRESS NOTE   The patient has COPD exacerbation with insulin-dependent diabetes, patient of Dr. Renard Matter.  Currently being treated with Solu-Medrol 80 IV, vancomycin and Zosyn as well as albuterol nebulizers q.4.  The patient also has hypercapnia.  GENERAL:  He is awake and alert in the bed, no respiratory distress. VITAL SIGNS:  Today, blood pressure 138/81, temperature 98.1, pulse 83 and regular, respiratory rate is 20, O2 sat is 100%. LUNGS:  Diminished breath sounds all over.  Mild end-expiratory wheeze. No rales, no rhonchi appreciable. HEART:  Regular rhythm.  No S3, S4.  Appreciable.  CO2 done today was 40, BUN 25, creatinine 0.91, so he is chronically compensated for hypercapnia.  PLAN:  Right now is to continue vancomycin and Zosyn and albuterol nebs. The patient had been smoking until admission to the hospital.  Monitor blood pressure and Dr. Renard Matter will resume care in the a.m.     Melvyn Novas, MD     RMD/MEDQ  D:  01/16/2012  T:  01/16/2012  Job:  [id number]

## 2012-01-16 NOTE — Progress Notes (Signed)
NAME:  DEMONTRE, PADIN                 ACCOUNT NO.:  [id number]  MEDICAL RECORD NO.:  [id number]  LOCATION:  A306                          FACILITY:  [facility]  PHYSICIAN:  Nela Bascom L. Juanetta Gosling, M.D.DATE OF BIRTH:  June 04, 1956  DATE OF PROCEDURE: DATE OF DISCHARGE:                                PROGRESS NOTE   Mr. Filley was able to be successfully extubated yesterday.  He says he feels well.  He has no new complaints.  PHYSICAL EXAMINATION:  He is awake and alert and looks very comfortable. His heart is regular.  His abdomen is soft.  Chest is much clearer. Blood pressure is running in the 120-130 range, pulse in the 80 range. His chest as mentioned is much clearer.  ASSESSMENT:  He has chronic obstructive pulmonary disease exacerbation, which resulted in acute respiratory failure requiring intubation and mechanical ventilation.  He is much improved now.  My plan is to continue with treatments, discontinue his Foley catheter, advance his diet, and I think it is okay for him to be transferred out of the ICU now.  He says he feels pretty much back to baseline as far as his breathing is concerned.     Jamylah Marinaccio L. Juanetta Gosling, M.D.     ELH/MEDQ  D:  01/15/2012  T:  01/16/2012  Job:  [id number]

## 2012-01-17 LAB — BASIC METABOLIC PANEL
BUN: 21 mg/dL (ref 6–23)
CO2: 36 mEq/L — ABNORMAL HIGH (ref 19–32)
GFR calc non Af Amer: >90 mL/min (ref >90)
Glucose, Bld: 319 mg/dL — ABNORMAL HIGH (ref 70–99)
Potassium: 4.3 mEq/L (ref 3.5–5.1)
Sodium: 135 mEq/L (ref 135–145)

## 2012-01-17 LAB — CULTURE, BLOOD (ROUTINE X 2): Culture: NO GROWTH

## 2012-01-17 LAB — GLUCOSE, CAPILLARY
Glucose-Capillary: 283 mg/dL — ABNORMAL HIGH (ref 70–99)
Glucose-Capillary: 160 mg/dL — ABNORMAL HIGH (ref 70–99)

## 2012-01-17 LAB — VANCOMYCIN, TROUGH: Vancomycin Tr: 15.7 ug/mL (ref 10.0–20.0)

## 2012-01-17 MED ORDER — GUAIFENESIN ER 600 MG PO TB12
1200.0000 mg | ORAL_TABLET | Freq: Two times a day (BID) | ORAL | Status: DC
  Administered 2012-01-17 – 2012-01-18 (×3): 1200 mg via ORAL
  Filled 2012-01-17 (×3): qty 2

## 2012-01-17 MED ORDER — DOXYCYCLINE HYCLATE 100 MG PO TABS
100.0000 mg | ORAL_TABLET | Freq: Two times a day (BID) | ORAL | Status: DC
  Administered 2012-01-17 – 2012-01-18 (×2): 100 mg via ORAL
  Filled 2012-01-17 (×2): qty 1

## 2012-01-17 MED ORDER — SODIUM CHLORIDE 0.9 % IJ SOLN
INTRAMUSCULAR | Status: AC
  Administered 2012-01-17: 10 mL
  Filled 2012-01-17: qty 3

## 2012-01-17 NOTE — Progress Notes (Signed)
Patient to be montiored thru the night for need of CPAP.

## 2012-01-17 NOTE — Progress Notes (Signed)
Chart review complete. Patient is appropriate for [facility] Care Management services.  Arlyss Queen RN CM notified via e-mail for referral coordination request as appropriate.For any additional questions or new referrals please contact Anibal Henderson BSN RN [facility] Liaison at [phone].

## 2012-01-17 NOTE — Consult Note (Signed)
ANTIBIOTIC CONSULT NOTE   Pharmacy Consult for vancomycin and zosyn Indication: pneumonia  No Known Allergies  Patient Measurements: Height: 6\' 2"  (188 cm) Weight: 164 lb 10.9 oz (74.7 kg) IBW/kg (Calculated) : 82.2   Vital Signs: Temp: 98 F (36.7 C) (04/15 0611) Temp src: Oral (04/15 0611) BP: 150/79 mmHg (04/15 0611) Pulse Rate: 81  (04/15 0611) Intake/Output from previous day: 04/14 0701 - 04/15 0700 In: 1630 [P.O.:1080; IV Piggyback:550] Out: 700 [Urine:700] Intake/Output from this shift: Total I/O In: 240 [P.O.:240] Out: -   Labs:  Basename 01/17/12 0546 01/16/12 0645  WBC -- --  HGB -- --  PLT -- --  LABCREA -- --  CREATININE 0.89 0.91   Estimated Creatinine Clearance: 96.8 ml/min (by C-G formula based on Cr of 0.89).  Basename 01/17/12 0843  VANCOTROUGH 15.7  VANCOPEAK --  VANCORANDOM --  GENTTROUGH --  GENTPEAK --  GENTRANDOM --  TOBRATROUGH --  TOBRAPEAK --  TOBRARND --  AMIKACINPEAK --  AMIKACINTROU --  AMIKACIN --     Microbiology: Recent Results (from the past 720 hour(s))  CULTURE, BLOOD (ROUTINE X 2)     Status: Normal   Collection Time   01/12/12  3:31 PM      Component Value Range Status Comment   Specimen Description BLOOD RIGHT ANTECUBITAL   Final    Special Requests BOTTLES DRAWN AEROBIC AND ANAEROBIC 5CC   Final    Culture NO GROWTH 5 DAYS   Final    Report Status 01/17/2012 FINAL   Final   CULTURE, BLOOD (ROUTINE X 2)     Status: Normal   Collection Time   01/12/12  3:44 PM      Component Value Range Status Comment   Specimen Description BLOOD RIGHT FOREARM   Final    Special Requests BOTTLES DRAWN AEROBIC AND ANAEROBIC 4CC   Final    Culture NO GROWTH 5 DAYS   Final    Report Status 01/17/2012 FINAL   Final   URINE CULTURE     Status: Normal   Collection Time   01/12/12  6:07 PM      Component Value Range Status Comment   Specimen Description URINE, CATHETERIZED   Final    Special Requests NONE   Final    Culture  Setup  Time [id number]   Final    Colony Count NO GROWTH   Final    Culture NO GROWTH   Final    Report Status 01/13/2012 FINAL   Final   MRSA PCR SCREENING     Status: Normal   Collection Time   01/12/12  6:27 PM      Component Value Range Status Comment   MRSA by PCR NEGATIVE  NEGATIVE  Final    Medical History: Past Medical History  Diagnosis Date  . COPD (chronic obstructive pulmonary disease)   . Asthma   . Hypertension   . Anxiety   . Diabetes mellitus    Anti-infectives     Start     Dose/Rate Route Frequency Ordered Stop   01/14/12 2200   vancomycin (VANCOCIN) 1,250 mg in sodium chloride 0.9 % 250 mL IVPB        1,250 mg 166.7 mL/hr over 90 Minutes Intravenous Every 12 hours 01/14/12 1141     01/12/12 2200   vancomycin (VANCOCIN) IVPB 1000 mg/200 mL premix  Status:  Discontinued        1,000 mg 200 mL/hr over 60 Minutes Intravenous Every 12 hours  01/12/12 2133 01/14/12 1141   01/12/12 2200  piperacillin-tazobactam (ZOSYN) IVPB 3.375 g       3.375 g 12.5 mL/hr over 240 Minutes Intravenous Every 8 hours 01/12/12 2133     01/12/12 2000   azithromycin (ZITHROMAX) 500 mg in dextrose 5 % 250 mL IVPB  Status:  Discontinued        500 mg 250 mL/hr over 60 Minutes Intravenous Every 24 hours 01/12/12 1839 01/12/12 2041   01/12/12 1930   cefTRIAXone (ROCEPHIN) 1 g in dextrose 5 % 50 mL IVPB  Status:  Discontinued        1 g 100 mL/hr over 30 Minutes Intravenous Every 24 hours 01/12/12 1839 01/12/12 2041   01/12/12 1630   doxycycline (VIBRAMYCIN) 100 mg in dextrose 5 % 250 mL IVPB        100 mg 125 mL/hr over 120 Minutes Intravenous Every 12 hours 01/12/12 1550           Assessment: Okay for Protocol Estimated Creatinine Clearance: 96.8 ml/min (by C-G formula based on Cr of 0.89). Trough @ goal.   Goal of Therapy:  Eradicate infection. vanco level 15-20  Plan:  Increase Vancomycin to 1250mg  IV every 12 hours. Zosyn 3.375gm q8hrs  Lamonte Richer R 01/17/2012,12:52  PM  PHARMACIST - PHYSICIAN COMMUNICATION CONCERNING: Antibiotic IV to Oral Route Change Policy  RECOMMENDATION: This patient is receiving Doxycycline by the intravenous route.  Based on criteria approved by the Pharmacy and Therapeutics Committee, the antibiotic(s) is/are being converted to the equivalent oral dose form(s).   DESCRIPTION: These criteria include:  Patient being treated for a respiratory tract infection, urinary tract infection, or cellulitis  The patient is not neutropenic and does not exhibit a GI malabsorption state  The patient is eating (either orally or via tube) and/or has been taking other orally administered medications for a least 24 hours  The patient is improving clinically and has a Tmax < 100.5  If you have questions about this conversion, please contact the Pharmacy Department  [x]   [phone] )  [facility] []   [phone] )  [facility]  []   [phone] )  [facility] []   [phone] )  [facility]    Mady Gemma, [facility]  01/17/2012 12:55 PM

## 2012-01-17 NOTE — Progress Notes (Signed)
[id number] 

## 2012-01-17 NOTE — Progress Notes (Signed)
NAME:  Jeff Ray, [place]TER[place]                 ACCOUNT NO.:  [id number]  MEDICAL RECORD NO.:  [id number]  LOCATION:  A306                          FACILITY:  [facility]  PHYSICIAN:  Corie Vavra G. Renard Matter, MD   DATE OF BIRTH:  02/28/1956  DATE OF PROCEDURE: DATE OF DISCHARGE:                                PROGRESS NOTE   This patient remains more alert today.  He does have COPD and insulin- dependent diabetes.  Remains on vancomycin, Zosyn, and neb treatments.  OBJECTIVE:  VITAL SIGNS:  Blood pressure 150/79, respirations 20, pulse 81, temp 98, O2 sat 96%. LUNGS:  Diminished breath sounds. HEART:  Regular rhythm. ABDOMEN:  No palpable organs or masses.  ASSESSMENT:  The patient does have chronic obstructive pulmonary disease, was admitted in respiratory distress.  Does have likelihood community-acquired pneumonia.  Has hypercapnia and hypoxia.  PLAN:  To continue current regimen.  He will remain on IV antibiotics, inhaled bronchodilators, antibiotic regimen, IV Zosyn at 3.375 g every 8 hours and vancomycin 1250 mg IV q.12 hours.  The patient is making improvement.     Jeff Ray G. Renard Matter, MD     AGM/MEDQ  D:  01/17/2012  T:  01/17/2012  Job:  [id number]

## 2012-01-17 NOTE — Progress Notes (Signed)
NAME:  KESHON, MARKOVITZ                 ACCOUNT NO.:  [id number]  MEDICAL RECORD NO.:  [id number]  LOCATION:                                 FACILITY:  PHYSICIAN:  Cavon Nicolls L. Juanetta Gosling, M.D.DATE OF BIRTH:  1956-01-12  DATE OF PROCEDURE:  01/16/2012 DATE OF DISCHARGE:                                PROGRESS NOTE   Mr. Richens was admitted with respiratory failure.  He has COPD.  He was able to be extubated, and he has done well with that.  He has no new complaints.  He says he is feeling well and is not short of breath.  PHYSICAL EXAMINATION:  GENERAL:  He is awake and alert. HEENT:  His pupils are reactive. CHEST:  Relatively clear. HEART:  Regular. ABDOMEN:  Soft. EXTREMITIES:  No edema.  ASSESSMENT:  He has chronic obstructive pulmonary disease and he is much improved after an episode of acute respiratory failure requiring ventilator support.  He has pneumonia as well.  My plan then is continue treatment, continue medications.  No changes today and follow.     Porcia Morganti L. Juanetta Gosling, M.D.     ELH/MEDQ  D:  01/16/2012  T:  01/17/2012  Job:  [id number]

## 2012-01-17 NOTE — Progress Notes (Signed)
   CARE MANAGEMENT NOTE 01/17/2012  Patient:  Jeff Ray, Jeff Ray   Account Number:  [id number]  Date Initiated:  01/14/2012  Documentation initiated by:  Sharrie Rothman  Subjective/Objective Assessment:   Pt admitted with pneumonia. Pt remains on ventilator. Pt lives with wife.     Action/Plan:   May need HH at discharge. Will continue to follow for CM needs.   Anticipated DC Date:  01/19/2012   Anticipated DC Plan:  HOME W HOME HEALTH SERVICES      DC Planning Services  CM consult      Choice offered to / List presented to:             Status of service:  In process, will continue to follow Medicare Important Message given?   (If response is "NO", the following Medicare IM given date fields will be blank) Date Medicare IM given:   Date Additional Medicare IM given:    Discharge Disposition:  HOME W HOME HEALTH SERVICES  Per UR Regulation:    If discussed at Long Length of Stay Meetings, dates discussed:    Comments:  01/17/12 1127 Arlyss Queen, RN BSN CM Pt is off the ventilator and is doing well. Potential discharge for 01/18/12. Pt has electric wheelchair, ramp at home. Pt has oxygen and neb machine with [employer]. Pt is agreeable to [facility] at discharge. Will benefit from RN and RT. Will obtain orders from MD. Will monitor for other CM needs.  01/14/12 1630 Arlyss Queen, RN BSN CM

## 2012-01-17 NOTE — Progress Notes (Signed)
NAME:  Jeff, Ray                 ACCOUNT NO.:  [id number]  MEDICAL RECORD NO.:  [id number]  LOCATION:  A306                          FACILITY:  [facility]  PHYSICIAN:  Coreon Simkins G. Renard Matter, MD   DATE OF BIRTH:  01/03/1956  DATE OF PROCEDURE: DATE OF DISCHARGE:                                PROGRESS NOTE   This patient has the following problems:  COPD, insulin-dependent diabetes, respiratory distress.  He has been moved from the unit to floor and seems to be much better.  He remains on IV Solu-Medrol at 80 mg and vancomycin and Zosyn as well, as well as albuterol nebulizer treatments.  OBJECTIVE:  VITAL SIGNS:  Blood pressure 150/79, respirations 20, pulse 81, temp 98. LUNGS:  Diminished breath sounds. HEART:  Regular rhythm. ABDOMEN:  No palpable organs or masses.  ASSESSMENT:  The patient does have chronic obstructive pulmonary disease, was admitted in respiratory distress with hypercapnia, hypoxia, and community-acquired pneumonia.  PLAN:  To continue current regimen.  He will remain on IV antibiotics, inhaled bronchodilators, antibiotic regimen, IV Zosyn 3.375 g every 8 hours and vancomycin 1250 mg IV q.12 hours.     Vincentina Sollers G. Renard Matter, MD     AGM/MEDQ  D:  01/17/2012  T:  01/17/2012  Job:  [id number]

## 2012-01-17 NOTE — Progress Notes (Signed)
NAME:  Jeff Ray, Jeff Ray                 ACCOUNT NO.:  [id number]  MEDICAL RECORD NO.:  [id number]  LOCATION:  A306                          FACILITY:  [facility]  PHYSICIAN:  Okla Qazi L. Juanetta Gosling, M.D.DATE OF BIRTH:  [birth year]  DATE OF PROCEDURE: DATE OF DISCHARGE:                                PROGRESS NOTE   Jeff Ray was admitted with respiratory failure and pneumonia.  He seems to be improving on a daily basis.  He has no new complaints this morning.  PHYSICAL EXAMINATION:  GENERAL:  He is sitting up, looks comfortable. VITAL SIGNS:  His temperature is 98, pulse 81, respirations 20, blood pressure 150/79, O2 sats 97%. CHEST:  Decreased breath sounds bilaterally. HEART:  Regular without gallop. ABDOMEN:  Soft. EXTREMITIES:  No edema.  ASSESSMENT:  He has had respiratory failure.  He does have chronic obstructive pulmonary disease.  He has pneumonia and he is improving on a daily basis.  I do not plan to change anything.  He will continue on steroids antibiotics, etc.     Jeff Ray L. Juanetta Gosling, M.D.     ELH/MEDQ  D:  01/17/2012  T:  01/17/2012  Job:  [id number]

## 2012-01-18 LAB — GLUCOSE, CAPILLARY

## 2012-01-18 NOTE — Progress Notes (Signed)
He says he is being discharged home today. I will of course sign off. I will be glad to see him in my office for his COPD if he and Dr. Renard Matter want to do that

## 2012-01-18 NOTE — Discharge Instructions (Signed)
[facility] for registered nurse and respiratory therapy.

## 2012-01-18 NOTE — Discharge Summary (Signed)
NAME:  DELMA, VILLALVA                 ACCOUNT NO.:  [id number]  MEDICAL RECORD NO.:  [id number]  LOCATION:  A306                          FACILITY:  [facility]  PHYSICIAN:  Laakea Pereira G. Renard Matter, MD   DATE OF BIRTH:  02-Nov-1955  DATE OF ADMISSION:  01/12/2012 DATE OF DISCHARGE:  LH                              DISCHARGE SUMMARY   This 56 year old Afro-American male, was admitted January 12, 2012, discharged January 18, 2012, 6 days hospitalization, diagnosis COPD, community-acquired pneumonia, respiratory distress with hypoxia and hypercapnia.  Insulin-dependent diabetes.  Condition stable and improved at the time of his discharge.  This is a 56 year old male who has had multiple admissions to this hospital in the past for COPD and exacerbation.  He was last seen in emergency room 2 days prior to this admission for another COPD flare up. He went home and apparently became progressively worse, and his sister saw him and he was confused and disoriented.  EMS was called and transferred him to the hospital.  PHYSICAL EXAMINATION:  VITAL SIGNS:  On admission, blood pressure 93/66- 192/91, pulse 116, respiration 32, temp 98.5. LUNGS:  Diminished breath sounds throughout. HEART:  Regular rhythm, but at times tachycardic. ABDOMEN:  No palpable organs or masses. LUNGS:  Rhonchi and wheezes heard throughout both lung fields. EXTREMITIES:  Free of edema.  LAB DATA:  On admission, WBC 13,000 with 85% neutrophils, 6 lymphocytes. Potassium 6.3, chloride 90, bicarb 41.  Pro-BNP 197.  Lactic acid 2.2. Glucose 195.  A 12-lead EKG showed sinus tachycardia with a rate of 108, as well as right bundle branch block.  Blood gases on admission showed a pH of 7.186 with a pCO2 of 109, pO2 of 388, later on ventilator pH was 7.23 with a pCO2 of 86.  Most recent blood gases pH 7.377 with a pCO2 of 67, pO2 89.6.  Most recent chemistries, sodium 135, potassium 4.3, chloride 94, CO2 36, BUN 21, creatinine 0.89, calcium 9.8.   Most recent glucose 319.  X-rays, most recent chest x-ray showed emphysema, small nodular density in left mid lung zone.  HOSPITAL COURSE:  Shortly after admission, the patient was intubated and remained in the ICU for most of his hospital stay, 5 days.  During this period of time, he was seen in consultation by Dr. Juanetta Gosling, who managed his ventilator care.  He remained on the following medications. Albuterol nebulizer 2.5 every 4 hours, Peridex oral rinse, doxycycline 100 mg every 12 hours, vancomycin 1250 mg IV q.12 hours, Zosyn 3.375 g every 8 hours, Lovenox 40 mg subcutaneously daily, Mucinex 1200 mg p.o. daily.  He is on aspart insulin sliding scale, prednisone, Solu-Medrol 80 mg every 6 hours.  On this regimen, he progressively improved, was able to be extubated over the weekend and subsequently moved to a concentrated care and telemetry.  He continued to improve.  His blood gases did show improvement.  His glucoses ranged from 160-257.  It was felt that he could be discharged to home care after 6 days hospitalizations, being discharged on the following medications, Mucinex 1200 mg.  B.i.d. Medrol Dosepak 4 mg 6 days, Protonix 40 mg daily, Avelox 400 mg daily, ProAir  inhaler 2 puffs every 4 hours as needed, ibuprofen 800 mg every 4 hours as needed for pain, metformin 1000 mg b.i.d., Singulair 10 mg at bedtime.  The patient's condition was stable at the time of his discharge and was scheduled to see primary care physician as an outpatient.     Shiquita Collignon G. Renard Matter, MD     AGM/MEDQ  D:  01/18/2012  T:  01/18/2012  Job:  [id number]

## 2012-01-18 NOTE — Progress Notes (Signed)
   CARE MANAGEMENT NOTE 01/18/2012  Patient:  Jeff Ray, Jeff Ray   Account Number:  [id number]  Date Initiated:  01/14/2012  Documentation initiated by:  Sharrie Rothman  Subjective/Objective Assessment:   Pt admitted with pneumonia. Pt remains on ventilator. Pt lives with wife.     Action/Plan:   May need HH at discharge. Will continue to follow for CM needs.   Anticipated DC Date:  01/19/2012   Anticipated DC Plan:  HOME W HOME HEALTH SERVICES      DC Planning Services  CM consult      [facility] Choice  HOME HEALTH   Choice offered to / List presented to:  C-1 Patient        HH arranged  HH-1 RN  HH-7 RESPIRATORY THERAPY      HH agency  [facility]   Status of service:  Completed, signed off Medicare Important Message given?  YES (If response is "NO", the following Medicare IM given date fields will be blank) Date Medicare IM given:  01/18/2012 Date Additional Medicare IM given:    Discharge Disposition:  HOME W HOME HEALTH SERVICES  Per UR Regulation:    If discussed at Long Length of Stay Meetings, dates discussed:    Comments:  01/18/12 1049 Jeff Queen, RN BSN Cm Pt discharged home today with [facility]. Jeff Ray of [facility] is aware and will collect the information from the chart. [facility] CM will also be following pt at discharge. Pt and pt nurse is aware of pts dicharge arrangements. No DME needs noted.  01/17/12 1127 Jeff Queen, RN BSN CM Pt is off the ventilator and is doing well. Potential discharge for 01/18/12. Pt has electric wheelchair, ramp at home. Pt has oxygen and neb machine with [employer]. Pt is agreeable to [facility] at discharge. Will benefit from RN and RT. Will obtain orders from MD. Will monitor for other CM needs.  01/14/12 1630 Jeff Queen, RN BSN CM

## 2012-01-18 NOTE — Progress Notes (Signed)
Patient received discharge instructions along with prescriptions and follow up appointments. Patient verbalized understanding of all instructions. Patient was escorted by staff via wheelchair to vehicle. Patient discharged to home in stable condition. 

## 2012-12-15 ENCOUNTER — Emergency Department (HOSPITAL_COMMUNITY): Payer: Medicare HMO

## 2012-12-15 ENCOUNTER — Inpatient Hospital Stay (HOSPITAL_COMMUNITY)
Admission: EM | Admit: 2012-12-15 | Discharge: 2012-12-21 | DRG: 189 | Disposition: A | Discharge status: Home / Self Care | Payer: Medicare HMO | Attending: Family Medicine | Admitting: Family Medicine

## 2012-12-15 ENCOUNTER — Encounter (HOSPITAL_COMMUNITY): Payer: Self-pay

## 2012-12-15 DIAGNOSIS — Z79899 Other long term (current) drug therapy: Secondary | ICD-10-CM

## 2012-12-15 DIAGNOSIS — F411 Generalized anxiety disorder: Secondary | ICD-10-CM | POA: Diagnosis present

## 2012-12-15 DIAGNOSIS — J45901 Unspecified asthma with (acute) exacerbation: Secondary | ICD-10-CM | POA: Diagnosis present

## 2012-12-15 DIAGNOSIS — J441 Chronic obstructive pulmonary disease with (acute) exacerbation: Secondary | ICD-10-CM | POA: Diagnosis present

## 2012-12-15 DIAGNOSIS — F17200 Nicotine dependence, unspecified, uncomplicated: Secondary | ICD-10-CM | POA: Diagnosis present

## 2012-12-15 DIAGNOSIS — E119 Type 2 diabetes mellitus without complications: Secondary | ICD-10-CM | POA: Diagnosis present

## 2012-12-15 DIAGNOSIS — Z9981 Dependence on supplemental oxygen: Secondary | ICD-10-CM

## 2012-12-15 DIAGNOSIS — J9620 Acute and chronic respiratory failure, unspecified whether with hypoxia or hypercapnia: Principal | ICD-10-CM | POA: Diagnosis present

## 2012-12-15 LAB — CBC WITH DIFFERENTIAL/PLATELET
Basophils Absolute: 0 10*3/uL (ref 0.0–0.1)
Basophils Relative: 0 % (ref 0–1)
Eosinophils Absolute: 0 10*3/uL (ref 0.0–0.7)
Eosinophils Relative: 0 % (ref 0–5)
MCH: 29.9 pg (ref 26.0–34.0)
MCV: 95.2 fL (ref 78.0–100.0)
Platelets: 211 10*3/uL (ref 150–400)
RDW: 13.5 % (ref 11.5–15.5)

## 2012-12-15 LAB — BLOOD GAS, ARTERIAL
Bicarbonate: 38.4 mEq/L — ABNORMAL HIGH (ref 20.0–24.0)
Expiratory PAP: 5
O2 Content: 3 L/min
Patient temperature: 37
Pressure support: 10 cmH2O
TCO2: 35.7 mmol/L (ref 0–100)
pCO2 arterial: 107 mmHg (ref 35.0–45.0)
pCO2 arterial: 95.5 mmHg (ref 35.0–45.0)
pH, Arterial: 7.182 — CL (ref 7.350–7.450)
pO2, Arterial: 85.5 mmHg (ref 80.0–100.0)

## 2012-12-15 LAB — BASIC METABOLIC PANEL
Calcium: 9.9 mg/dL (ref 8.4–10.5)
GFR calc non Af Amer: >90 mL/min (ref >90)
Sodium: 136 mEq/L (ref 135–145)

## 2012-12-15 LAB — GLUCOSE, CAPILLARY
Glucose-Capillary: 207 mg/dL — ABNORMAL HIGH (ref 70–99)
Glucose-Capillary: 158 mg/dL — ABNORMAL HIGH (ref 70–99)

## 2012-12-15 LAB — TROPONIN I: Troponin I: <0.3 ng/mL (ref <0.30)

## 2012-12-15 LAB — MRSA PCR SCREENING: MRSA by PCR: NEGATIVE

## 2012-12-15 MED ORDER — LEVOFLOXACIN IN D5W 750 MG/150ML IV SOLN
750.0000 mg | Freq: Once | INTRAVENOUS | Status: AC
  Administered 2012-12-15: 750 mg via INTRAVENOUS
  Filled 2012-12-15: qty 150

## 2012-12-15 MED ORDER — IPRATROPIUM BROMIDE 0.02 % IN SOLN
0.5000 mg | RESPIRATORY_TRACT | Status: DC
  Administered 2012-12-15 – 2012-12-21 (×34): 0.5 mg via RESPIRATORY_TRACT
  Filled 2012-12-15 (×34): qty 2.5

## 2012-12-15 MED ORDER — METFORMIN HCL 500 MG PO TABS
1000.0000 mg | ORAL_TABLET | Freq: Two times a day (BID) | ORAL | Status: DC
  Administered 2012-12-15 – 2012-12-21 (×12): 1000 mg via ORAL
  Filled 2012-12-15 (×12): qty 2

## 2012-12-15 MED ORDER — ALBUTEROL (5 MG/ML) CONTINUOUS INHALATION SOLN
15.0000 mg | INHALATION_SOLUTION | Freq: Once | RESPIRATORY_TRACT | Status: AC
  Administered 2012-12-15: 15 mg via RESPIRATORY_TRACT
  Filled 2012-12-15: qty 20

## 2012-12-15 MED ORDER — ALPRAZOLAM 0.25 MG PO TABS
0.2500 mg | ORAL_TABLET | Freq: Three times a day (TID) | ORAL | Status: DC | PRN
  Administered 2012-12-16: 0.25 mg via ORAL
  Filled 2012-12-15: qty 1

## 2012-12-15 MED ORDER — BIOTENE DRY MOUTH MT LIQD
15.0000 mL | Freq: Two times a day (BID) | OROMUCOSAL | Status: DC
  Administered 2012-12-15 – 2012-12-21 (×12): 15 mL via OROMUCOSAL

## 2012-12-15 MED ORDER — METHYLPREDNISOLONE SODIUM SUCC 125 MG IJ SOLR
125.0000 mg | Freq: Once | INTRAMUSCULAR | Status: AC
  Administered 2012-12-15: 125 mg via INTRAVENOUS
  Filled 2012-12-15: qty 2

## 2012-12-15 MED ORDER — ATORVASTATIN CALCIUM 20 MG PO TABS
20.0000 mg | ORAL_TABLET | Freq: Every day | ORAL | Status: DC
  Administered 2012-12-15 – 2012-12-21 (×6): 20 mg via ORAL
  Filled 2012-12-15 (×6): qty 1

## 2012-12-15 MED ORDER — ALBUTEROL SULFATE (5 MG/ML) 0.5% IN NEBU
5.0000 mg | INHALATION_SOLUTION | Freq: Once | RESPIRATORY_TRACT | Status: DC
  Filled 2012-12-15: qty 1

## 2012-12-15 MED ORDER — LORATADINE 10 MG PO TABS
10.0000 mg | ORAL_TABLET | Freq: Every day | ORAL | Status: DC
  Administered 2012-12-15 – 2012-12-21 (×7): 10 mg via ORAL
  Filled 2012-12-15 (×7): qty 1

## 2012-12-15 MED ORDER — GUAIFENESIN ER 600 MG PO TB12
1200.0000 mg | ORAL_TABLET | Freq: Two times a day (BID) | ORAL | Status: DC
  Administered 2012-12-15 – 2012-12-21 (×12): 1200 mg via ORAL
  Filled 2012-12-15 (×12): qty 2

## 2012-12-15 MED ORDER — IPRATROPIUM BROMIDE 0.02 % IN SOLN
1.0000 mg | Freq: Once | RESPIRATORY_TRACT | Status: AC
Start: 2012-12-15 — End: 2012-12-15
  Administered 2012-12-15: 1 mg via RESPIRATORY_TRACT
  Filled 2012-12-15: qty 5

## 2012-12-15 MED ORDER — LEVOFLOXACIN IN D5W 750 MG/150ML IV SOLN
750.0000 mg | INTRAVENOUS | Status: DC
  Administered 2012-12-16 – 2012-12-17 (×2): 750 mg via INTRAVENOUS
  Filled 2012-12-15 (×4): qty 150

## 2012-12-15 MED ORDER — IPRATROPIUM BROMIDE 0.02 % IN SOLN
0.5000 mg | Freq: Once | RESPIRATORY_TRACT | Status: DC
  Filled 2012-12-15: qty 2.5

## 2012-12-15 MED ORDER — METHYLPREDNISOLONE SODIUM SUCC 125 MG IJ SOLR
125.0000 mg | Freq: Four times a day (QID) | INTRAMUSCULAR | Status: DC
  Administered 2012-12-15 – 2012-12-19 (×17): 125 mg via INTRAVENOUS
  Filled 2012-12-15 (×17): qty 2

## 2012-12-15 MED ORDER — ALBUTEROL SULFATE (5 MG/ML) 0.5% IN NEBU
2.5000 mg | INHALATION_SOLUTION | RESPIRATORY_TRACT | Status: AC
  Administered 2012-12-15 (×3): 2.5 mg via RESPIRATORY_TRACT
  Filled 2012-12-15 (×2): qty 0.5

## 2012-12-15 MED ORDER — MONTELUKAST SODIUM 10 MG PO TABS
10.0000 mg | ORAL_TABLET | Freq: Every day | ORAL | Status: DC
  Administered 2012-12-15 – 2012-12-20 (×6): 10 mg via ORAL
  Filled 2012-12-15 (×6): qty 1

## 2012-12-15 MED ORDER — SODIUM CHLORIDE 0.9 % IV SOLN
INTRAVENOUS | Status: AC
  Administered 2012-12-15: 18:00:00 via INTRAVENOUS
  Administered 2012-12-16: 200 mL via INTRAVENOUS

## 2012-12-15 NOTE — ED Notes (Signed)
Pt c/o SOB and cough for past 3 days.  Reports cough is productive at time with grayish colored sputum.  Denies any pain.  C/O generalized weakness.  Pt usually on o2 at 2 liters but increased to 3liters today.  Has been using proair inhaler prn and had albuterol neb treatment approx 2 hours ago.

## 2012-12-15 NOTE — ED Notes (Signed)
Unable to give neb at this time due to lack of needed equipment

## 2012-12-15 NOTE — ED Notes (Signed)
CRITICAL VALUE ALERT  Critical value received:  ABG results  Date of notification:  12/15/12  Time of notification:  1338  Critical value read back:yes  Nurse who received alert:  Lake Bells, RN  MD notified (1st page):  Dr Clarene Duke  Time of first page:  1340

## 2012-12-15 NOTE — Progress Notes (Signed)
ANTIBIOTIC CONSULT NOTE - INITIAL  Pharmacy Consult for Levaquin Indication: rule out pneumonia, SOB  No Known Allergies  Patient Measurements: Height: 5\' 9"  (175.3 cm) Weight: 160 lb (72.576 kg) IBW/kg (Calculated) : 70.7  Vital Signs: Temp: 98.7 F (37.1 C) (03/14 0939) Temp src: Oral (03/14 0939) BP: 146/76 mmHg (03/14 1527) Pulse Rate: 117 (03/14 1527) Intake/Output from previous day:   Intake/Output from this shift:    Labs:  Recent Labs  12/15/12 0947  WBC 6.6  HGB 15.0  PLT 211  CREATININE 0.94   Estimated Creatinine Clearance: 85.7 ml/min (by C-G formula based on Cr of 0.94). No results found for this basename: VANCOTROUGH, VANCOPEAK, VANCORANDOM, GENTTROUGH, GENTPEAK, GENTRANDOM, TOBRATROUGH, TOBRAPEAK, TOBRARND, AMIKACINPEAK, AMIKACINTROU, AMIKACIN,  in the last 72 hours   Microbiology: No results found for this or any previous visit (from the past 720 hour(s)).  Medical History: Past Medical History  Diagnosis Date  . COPD (chronic obstructive pulmonary disease)   . Asthma   . Hypertension   . Anxiety   . Diabetes mellitus     Medications:  Scheduled:  . albuterol  5 mg Nebulization Once  . [COMPLETED] albuterol  15 mg Nebulization Once  . ipratropium  0.5 mg Nebulization Once  . [COMPLETED] ipratropium  1 mg Nebulization Once  . [COMPLETED] methylPREDNISolone (SOLU-MEDROL) injection  125 mg Intravenous Once   Assessment: 57yo male with SOB.  Pt has good renal fxn.  Estimated Creatinine Clearance: 85.7 ml/min (by C-G formula based on Cr of 0.94).  Goal of Therapy:  Eradicate infection.  Plan:  Levaquin 750mg  IV q24hrs Monitor labs, renal fxn, and cultures per protocol  Valrie Hart A 12/15/2012,3:47 PM

## 2012-12-15 NOTE — ED Provider Notes (Signed)
History     CSN: [id number]  Arrival date & time 12/15/12  7829   First MD Initiated Contact with Patient 12/15/12 [phone]      Chief Complaint  Patient presents with  . Shortness of Breath     HPI Pt was seen at 1025.   Per pt and his family, c/o gradual onset and worsening of persistent cough, wheezing and SOB for the past 3 days.  Describes his symptoms as "my COPD is acting up."  Has been using home MDI and nebs without relief.  Pt states the SOB worsens on exertion.  Pt states he has "turned up" his usual O2 N/C 2L to 3L due to feeling SOB. Endorses his symptoms began after he "started smoking more because I'm stressed." Pt states he called his PMD yesterday regarding same, rx prednisone without improvement.  Denies CP/palpitations, no back pain, no abd pain, no N/V/D, no fevers, no rash.     Past Medical History  Diagnosis Date  . COPD (chronic obstructive pulmonary disease)   . Asthma   . Hypertension   . Anxiety   . Diabetes mellitus     Past Surgical History  Procedure Laterality Date  . Back surgery      History  Substance Use Topics  . Smoking status: Current Every Day Smoker -- 1.00 packs/day for 10 years    Types: Cigarettes  . Smokeless tobacco: Not on file  . Alcohol Use: No      Review of Systems ROS: Statement: All systems negative except as marked or noted in the HPI; Constitutional: Negative for fever and chills. ; ; Eyes: Negative for eye pain, redness and discharge. ; ; ENMT: Negative for ear pain, hoarseness, nasal congestion, sinus pressure and sore throat. ; ; Cardiovascular: Negative for chest pain, palpitations, diaphoresis, and peripheral edema. ; ; Respiratory: +cough, wheezing, SOB. Negative for stridor. ; ; Gastrointestinal: Negative for nausea, vomiting, diarrhea, abdominal pain, blood in stool, hematemesis, jaundice and rectal bleeding. . ; ; Genitourinary: Negative for dysuria, flank pain and hematuria. ; ; Musculoskeletal: Negative for back  pain and neck pain. Negative for swelling and trauma.; ; Skin: Negative for pruritus, rash, abrasions, blisters, bruising and skin lesion.; ; Neuro: Negative for headache, lightheadedness and neck stiffness. Negative for weakness, altered level of consciousness , altered mental status, extremity weakness, paresthesias, involuntary movement, seizure and syncope.       Allergies  Review of patient's allergies indicates no known allergies.  Home Medications   Current Outpatient Rx  Name  Route  Sig  Dispense  Refill  . albuterol (PROAIR HFA) 108 (90 BASE) MCG/ACT inhaler   Inhalation   Inhale 2 puffs into the lungs every 6 (six) hours as needed. Shortness of Breath         . ALPRAZolam (XANAX) 0.25 MG tablet   Oral   Take 0.25 mg by mouth 3 (three) times daily as needed for anxiety.         . fexofenadine-pseudoephedrine (ALLEGRA-D 24) 180-240 MG per 24 hr tablet   Oral   Take 1 tablet by mouth daily.         [name] guaiFENesin (MUCINEX) 600 MG 12 hr tablet   Oral   Take 1,200 mg by mouth daily. congestion         . ibuprofen (ADVIL,MOTRIN) 800 MG tablet   Oral   Take 800 mg by mouth daily as needed. For pain         . ipratropium-albuterol (  DUONEB) 0.5-2.5 (3) MG/3ML SOLN   Nebulization   Take 3 mLs by nebulization every 4 (four) hours as needed. For shortness of breath         . metFORMIN (GLUCOPHAGE) 1000 MG tablet   Oral   Take 1 tablet (1,000 mg total) by mouth 2 (two) times daily.   60 tablet   5   . montelukast (SINGULAIR) 10 MG tablet   Oral   Take 10 mg by mouth at bedtime.           . predniSONE (DELTASONE) 10 MG tablet   Oral   Take 20-30 mg by mouth daily.         . rosuvastatin (CRESTOR) 10 MG tablet   Oral   Take 10 mg by mouth daily.           BP 146/76  Pulse 117  Temp(Src) 98.7 F (37.1 C) (Oral)  Resp 22  Ht 5\' 9"  (1.753 m)  Wt 160 lb (72.576 kg)  BMI 23.62 kg/m2  SpO2 100%  Physical Exam 1030: Physical examination:   Nursing notes reviewed; Vital signs and O2 SAT reviewed;  Constitutional: Well developed, Well nourished, Uncomfortable appearing; Head:  Normocephalic, atraumatic; Eyes: EOMI, PERRL, No scleral icterus; ENMT: Mouth and pharynx normal, Mucous membranes dry; Neck: Supple, Full range of motion, No lymphadenopathy; Cardiovascular: Tachycardic rate and rhythm, No gallop; Respiratory: Breath sounds diminished & equal bilaterally, faint scattered wheezes, +audible wheezing.  Speaking short phrases. Tachypneic, sitting upright, +access mm use.; Chest: Nontender, Movement normal; Abdomen: Soft, Nontender, Nondistended, Normal bowel sounds;; Extremities: Pulses normal, No tenderness, No edema, No calf edema or asymmetry.; Neuro: AA&Ox3, Major CN grossly intact.  Speech clear. No gross focal motor or sensory deficits in extremities.; Skin: Color normal, Warm, Dry.   ED Course  Procedures   1035:  Pt with significant wheezing, SOB.  Will dose IV solumedrol and hour long neb.   1215:  Pt has completed hour long neb and been given IV steroid.  Pt states he "feels a little better now."  VSS, Sats 98% O2 3L N/C, lungs with insp/exp wheezes bilat, no further audible wheezing, speaking in sentences.  Will dose another neb. Dx and testing d/w pt and family.  Questions answered.  Verb understanding, agreeable to admit.  1255:  T/C to Dr. Renard Matter, case discussed, including:  HPI, pertinent PM/SHx, VS/PE, dx testing, ED course and treatment:  Agreeable to admit, requests to write temporary orders, obtain stepdown bed.  1345:  ABG resulted. Pt continues with stable VS, Sats 100% on O2 3L N/C.  Lungs with insp/exp wheezing, now audibly wheezing again.  Will dose another neb. Pt dozing on and off but easily arousable to name, A&O.  ABG results explained to pt and family.  Pt tearful, begging me "not to put me on life support right now."  Long d/w pt and family, regarding possible need for same (as per his last admit 01/2012).   Pt and family requesting to "try something else first." Will trial bipap and re-check ABG.  Family and pt both understand if this intervention does not improve his resp status he will likely need to be intubated ("put on life support") again.  Pt and family verb understanding and wish to "start with the sleep apnea machine" (bipap).  1415:  Pt on bipap.  Appears to be breathing easier, Sats 100%, A&O.  States he "feels better" on bipap.  Will continue to monitor.  1515:  Continues on bipap.  2nd ABG obtained; is improving.  T/C to Dr. Renard Matter, pt's status updated: will continue bipap and admit to ICU.      MDM  MDM Reviewed: previous chart, nursing note and vitals Reviewed previous: labs and ECG Interpretation: labs, ECG and x-ray Total time providing critical care: 30-74 minutes. This excludes time spent performing separately reportable procedures and services. Consults: admitting MD   CRITICAL CARE Performed by: Laray Anger Total critical care time: 90 Critical care time was exclusive of separately billable procedures and treating other patients. Critical care was necessary to treat or prevent imminent or life-threatening deterioration. Critical care was time spent personally by me on the following activities: development of treatment plan with patient and/or surrogate as well as nursing, discussions with consultants, evaluation of patient's response to treatment, examination of patient, obtaining history from patient or surrogate, ordering and performing treatments and interventions, ordering and review of laboratory studies, ordering and review of radiographic studies, pulse oximetry and re-evaluation of patient's condition.    Date: 12/15/2012  Rate: 111  Rhythm: sinus tachycardia  QRS Axis: normal  Intervals: normal  ST/T Wave abnormalities: normal  Conduction Disutrbances:right bundle branch block  Narrative Interpretation:   Old EKG Reviewed: unchanged; no significant  changes from previous EKG dated 01/12/2012.  Results for orders placed during the hospital encounter of 12/15/12  CBC WITH DIFFERENTIAL      Result Value Range   WBC 6.6  4.0 - 10.5 K/uL   RBC 5.02  4.22 - 5.81 MIL/uL   Hemoglobin 15.0  13.0 - 17.0 g/dL   HCT 09.8  11.9 - 14.7 %   MCV 95.2  78.0 - 100.0 fL   MCH 29.9  26.0 - 34.0 pg   MCHC 31.4  30.0 - 36.0 g/dL   RDW 82.9  56.2 - 13.0 %   Platelets 211  150 - 400 K/uL   Neutrophils Relative 85 (*) 43 - 77 %   Neutro Abs 5.6  1.7 - 7.7 K/uL   Lymphocytes Relative 11 (*) 12 - 46 %   Lymphs Abs 0.7  0.7 - 4.0 K/uL   Monocytes Relative 4  3 - 12 %   Monocytes Absolute 0.2  0.1 - 1.0 K/uL   Eosinophils Relative 0  0 - 5 %   Eosinophils Absolute 0.0  0.0 - 0.7 K/uL   Basophils Relative 0  0 - 1 %   Basophils Absolute 0.0  0.0 - 0.1 K/uL  BASIC METABOLIC PANEL      Result Value Range   Sodium 136  135 - 145 mEq/L   Potassium 4.7  3.5 - 5.1 mEq/L   Chloride 91 (*) 96 - 112 mEq/L   CO2 39 (*) 19 - 32 mEq/L   Glucose, Bld 175 (*) 70 - 99 mg/dL   BUN 8  6 - 23 mg/dL   Creatinine, Ser 8.65  0.50 - 1.35 mg/dL   Calcium 9.9  8.4 - 78.4 mg/dL   GFR calc non Af Amer >90  >90 mL/min   GFR calc Af Amer >90  >90 mL/min  TROPONIN I      Result Value Range   Troponin I <0.30  <0.30 ng/mL  BLOOD GAS, ARTERIAL      Result Value Range   O2 Content 3.0     Delivery systems NASAL CANNULA     pH, Arterial 7.182 (*) 7.350 - 7.450   pCO2 arterial 107.0 (*) 35.0 - 45.0 mmHg   pO2, Arterial 79.5 (*)  80.0 - 100.0 mmHg   Bicarbonate 38.5 (*) 20.0 - 24.0 mEq/L   TCO2 35.7  0 - 100 mmol/L   Acid-Base Excess 10.2 (*) 0.0 - 2.0 mmol/L   O2 Saturation 95.1     Patient temperature 37.0     Collection site RIGHT RADIAL     Drawn by COLLECTED BY RT     Sample type ARTERIAL     Allens test (pass/fail) PASS  PASS  BLOOD GAS, ARTERIAL      Result Value Range   FIO2 0.40     Delivery systems BILEVEL POSITIVE AIRWAY PRESSURE     Inspiratory PAP 15.0      Expiratory PAP 5.0     Pressure support 10.0     pH, Arterial 7.228 (*) 7.350 - 7.450   pCO2 arterial 95.5 (*) 35.0 - 45.0 mmHg   pO2, Arterial 85.5  80.0 - 100.0 mmHg   Bicarbonate 38.4 (*) 20.0 - 24.0 mEq/L   TCO2 35.2  0 - 100 mmol/L   Acid-Base Excess 10.8 (*) 0.0 - 2.0 mmol/L   O2 Saturation 96.3     Patient temperature 37.0     Collection site RIGHT RADIAL     Drawn by Serita Sheller     Sample type ARTERIAL DRAW     Allens test (pass/fail) PASS  PASS   Dg Chest Portable 1 View 12/15/2012  *RADIOLOGY REPORT*  Clinical Data: Shortness of breath.  PORTABLE CHEST - 1 VIEW  Comparison: Single view of the chest 01/13/2012.  CT chest 06/03/2011.  Findings: The lungs appear severely emphysematous but are clear. No pneumothorax or pleural fluid is identified.  Heart size is normal.  IMPRESSION: Severe emphysema without acute disease.   Original Report Authenticated By: Holley Dexter, M.D.              Laray Anger, DO 12/16/12 1512

## 2012-12-16 LAB — BLOOD GAS, ARTERIAL
Acid-Base Excess: 10.7 mmol/L — ABNORMAL HIGH (ref 0.0–2.0)
Bicarbonate: 36.6 mEq/L — ABNORMAL HIGH (ref 20.0–24.0)
TCO2: 33.1 mmol/L (ref 0–100)
pCO2 arterial: 69 mmHg (ref 35.0–45.0)
pH, Arterial: 7.344 — ABNORMAL LOW (ref 7.350–7.450)

## 2012-12-16 LAB — GLUCOSE, CAPILLARY
Glucose-Capillary: 136 mg/dL — ABNORMAL HIGH (ref 70–99)
Glucose-Capillary: 164 mg/dL — ABNORMAL HIGH (ref 70–99)

## 2012-12-16 MED ORDER — ALBUTEROL SULFATE (5 MG/ML) 0.5% IN NEBU
2.5000 mg | INHALATION_SOLUTION | RESPIRATORY_TRACT | Status: DC
  Administered 2012-12-16 – 2012-12-21 (×33): 2.5 mg via RESPIRATORY_TRACT
  Filled 2012-12-16 (×33): qty 0.5

## 2012-12-16 MED ORDER — ALPRAZOLAM 0.5 MG PO TABS
0.5000 mg | ORAL_TABLET | Freq: Three times a day (TID) | ORAL | Status: DC | PRN
  Administered 2012-12-16 – 2012-12-21 (×11): 0.5 mg via ORAL
  Filled 2012-12-16 (×11): qty 1

## 2012-12-16 MED ORDER — NICOTINE 21 MG/24HR TD PT24
21.0000 mg | MEDICATED_PATCH | Freq: Every day | TRANSDERMAL | Status: DC
  Administered 2012-12-16 – 2012-12-21 (×6): 21 mg via TRANSDERMAL
  Filled 2012-12-16 (×6): qty 1

## 2012-12-16 MED ORDER — ALBUTEROL SULFATE (5 MG/ML) 0.5% IN NEBU: 2.5000 mg | INHALATION_SOLUTION | RESPIRATORY_TRACT | Status: DC | PRN

## 2012-12-16 MED ORDER — ALBUTEROL SULFATE (5 MG/ML) 0.5% IN NEBU: 2.5000 mg | INHALATION_SOLUTION | Freq: Four times a day (QID) | RESPIRATORY_TRACT | Status: DC | PRN

## 2012-12-16 MED ORDER — ENOXAPARIN SODIUM 40 MG/0.4ML ~~LOC~~ SOLN
40.0000 mg | Freq: Every day | SUBCUTANEOUS | Status: DC
  Administered 2012-12-16 – 2012-12-21 (×6): 40 mg via SUBCUTANEOUS
  Filled 2012-12-16 (×6): qty 0.4

## 2012-12-16 NOTE — Progress Notes (Signed)
Subjective: The patient is feeling some better this morning. He was on BiPAP with 40% oxygen his been changed over to nasal cannula at 3 L per minute. His chest x-ray did not show evidence of pneumonia but the evidence of COPD. In ED he did have marked elevation of PCO2 and hypoxia but improved with BiPAP.   Objective: Vital signs in last 24 hours: Temp:  [97.7 F (36.5 C)-99.5 F (37.5 C)] 97.7 F (36.5 C) (03/15 0752) Pulse Rate:  [101-120] 105 (03/15 0600) Resp:  [15-26] 22 (03/15 0600) BP: (112-157)/(70-84) 121/73 mmHg (03/15 0600) SpO2:  [93 %-100 %] 99 % (03/15 0828) FiO2 (%):  [30 %-40 %] 30 % (03/15 0400) Weight:  [78.5 kg (173 lb 1 oz)-82.5 kg (181 lb 14.1 oz)] 82.5 kg (181 lb 14.1 oz) (03/15 0500) Weight change:  Last BM Date: 12/14/12  Intake/Output from previous day: 03/14 0701 - 03/15 0700 In: 1433.3 [I.V.:1283.3; IV Piggyback:150] Out: 150 [Urine:150] Intake/Output this shift: Total I/O In: 320 [P.O.:120; I.V.:200] Out: 250 [Urine:250]  Physical Exam: The patient is alert and oriented  Vital signs blood pressure 121/73 respirations 22 pulse 105 temp 97.7  HEENT negative  Neck supple no JVD or thyroid abnormalities  Lungs diminished breath sounds heart regular rhythm  Abdomen the palpable organs or masses  Extremities free of edema   Recent Labs  12/15/12 0947  WBC 6.6  HGB 15.0  HCT 47.8  PLT 211   BMET  Recent Labs  12/15/12 0947  NA 136  K 4.7  CL 91*  CO2 39*  GLUCOSE 175*  BUN 8  CREATININE 0.94  CALCIUM 9.9    Studies/Results: Dg Chest Portable 1 View  12/15/2012  *RADIOLOGY REPORT*  Clinical Data: Shortness of breath.  PORTABLE CHEST - 1 VIEW  Comparison: Single view of the chest 01/13/2012.  CT chest 06/03/2011.  Findings: The lungs appear severely emphysematous but are clear. No pneumothorax or pleural fluid is identified.  Heart size is normal.  IMPRESSION: Severe emphysema without acute disease.   Original Report  Authenticated By: Holley Dexter, M.D.     Medications:  . [COMPLETED] sodium chloride   Intravenous STAT  . albuterol  2.5 mg Nebulization Q4H  . antiseptic oral rinse  15 mL Mouth Rinse BID  . atorvastatin  20 mg Oral q1800  . guaiFENesin  1,200 mg Oral BID  . ipratropium  0.5 mg Nebulization Q4H  . levofloxacin (LEVAQUIN) IV  750 mg Intravenous Q24H  . loratadine  10 mg Oral Daily  . metFORMIN  1,000 mg Oral BID WC  . methylPREDNISolone (SOLU-MEDROL) injection  125 mg Intravenous Q6H  . montelukast  10 mg Oral QHS  . nicotine  21 mg Transdermal Daily        Assessment/Plan: The patient has COPD with acute on chronic respiratory failure hypoxia and hypercapnia. Plan to continue current IV antibiotics bronchodilators and steroids. Will repeat blood gases this morning.   LOS: 1 day   Shameca Landen G 12/16/2012, 10:02 AM

## 2012-12-16 NOTE — Consult Note (Addendum)
Consult requested by: Dr. Renard Matter Consult requested for respiratory failure:  HPI: This is a 56 year old who was in his usual state of poor health at home when he developed increasing problems with what he describes as stress. He says he has a stressful situation in his home and his response to the stress was to smoke more. He says he then became increasingly short of breath and eventually was brought to the emergency room. In the emergency room he was started on BiPAP and improved somewhat. His arterial blood gas showed markedly elevated PCO2 which has improved. He is now awake and alert anxious and on nasal cannula.  Past Medical History  Diagnosis Date  . COPD (chronic obstructive pulmonary disease)   . Asthma   . Hypertension   . Anxiety   . Diabetes mellitus      No family history on file.   History   Social History  . Marital Status: Married    Spouse Name: N/A    Number of Children: N/A  . Years of Education: N/A   Social History Main Topics  . Smoking status: Current Every Day Smoker -- 1.00 packs/day for 10 years    Types: Cigarettes  . Smokeless tobacco: None  . Alcohol Use: No  . Drug Use: No  . Sexually Active:    Other Topics Concern  . None   Social History Narrative  . None     ROS: He says he still very anxious. He denies fever chills nausea vomiting or chest pain.    Objective: Vital signs in last 24 hours: Temp:  [97.7 F (36.5 C)-99.5 F (37.5 C)] 97.7 F (36.5 C) (03/15 0752) Pulse Rate:  [101-120] 105 (03/15 0600) Resp:  [15-26] 22 (03/15 0600) BP: (112-157)/(70-84) 121/73 mmHg (03/15 0600) SpO2:  [93 %-100 %] 99 % (03/15 0828) FiO2 (%):  [30 %-40 %] 30 % (03/15 0400) Weight:  [78.5 kg (173 lb 1 oz)-82.5 kg (181 lb 14.1 oz)] 82.5 kg (181 lb 14.1 oz) (03/15 0500) Weight change:  Last BM Date: 12/14/12  Intake/Output from previous day: 03/14 0701 - 03/15 0700 In: 1433.3 [I.V.:1283.3; IV Piggyback:150] Out: 150 [Urine:150]  PHYSICAL  EXAM He is awake and alert. He is still somewhat dyspneic at rest. He is on nasal cannula. His pupils are reactive. His mucous membranes are moist. His neck is supple. His chest shows decreased breath sounds and prolonged expiration. He has a barrel chest. His heart is regular with diminished heart sounds. His abdomen is soft without masses. He does not have any edema. Central nervous system examination shows a she's anxious but otherwise intact  Lab Results: Basic Metabolic Panel:  Recent Labs  14/78/29 0947  NA 136  K 4.7  CL 91*  CO2 39*  GLUCOSE 175*  BUN 8  CREATININE 0.94  CALCIUM 9.9   Liver Function Tests: No results found for this basename: AST, ALT, ALKPHOS, BILITOT, PROT, ALBUMIN,  in the last 72 hours No results found for this basename: LIPASE, AMYLASE,  in the last 72 hours No results found for this basename: AMMONIA,  in the last 72 hours CBC:  Recent Labs  12/15/12 0947  WBC 6.6  NEUTROABS 5.6  HGB 15.0  HCT 47.8  MCV 95.2  PLT 211   Cardiac Enzymes:  Recent Labs  12/15/12 1000  TROPONINI <0.30   BNP: No results found for this basename: PROBNP,  in the last 72 hours D-Dimer: No results found for this basename: DDIMER,  in  the last 72 hours CBG:  Recent Labs  12/15/12 1722 12/15/12 2045 12/16/12 0738  GLUCAP 207* 158* 159*   Hemoglobin A1C: No results found for this basename: HGBA1C,  in the last 72 hours Fasting Lipid Panel: No results found for this basename: CHOL, HDL, LDLCALC, TRIG, CHOLHDL, LDLDIRECT,  in the last 72 hours Thyroid Function Tests: No results found for this basename: TSH, T4TOTAL, FREET4, T3FREE, THYROIDAB,  in the last 72 hours Anemia Panel: No results found for this basename: VITAMINB12, FOLATE, FERRITIN, TIBC, IRON, RETICCTPCT,  in the last 72 hours Coagulation: No results found for this basename: LABPROT, INR,  in the last 72 hours Urine Drug Screen: Drugs of Abuse  No results found for this basename: labopia,  cocainscrnur, labbenz, amphetmu, thcu, labbarb    Alcohol Level: No results found for this basename: ETH,  in the last 72 hours Urinalysis: No results found for this basename: COLORURINE, APPERANCEUR, LABSPEC, PHURINE, GLUCOSEU, HGBUR, BILIRUBINUR, KETONESUR, PROTEINUR, UROBILINOGEN, NITRITE, LEUKOCYTESUR,  in the last 72 hours Misc. Labs:   ABGS:  Recent Labs  12/15/12 1515  PHART 7.228*  PO2ART 85.5  TCO2 35.2  HCO3 38.4*     MICROBIOLOGY: Recent Results (from the past 240 hour(s))  MRSA PCR SCREENING     Status: None   Collection Time    12/15/12  6:30 PM      Result Value Range Status   MRSA by PCR NEGATIVE  NEGATIVE Final   Comment:            The GeneXpert MRSA Assay (FDA     approved for NASAL specimens     only), is one component of a     comprehensive MRSA colonization     surveillance program. It is not     intended to diagnose MRSA     infection nor to guide or     monitor treatment for     MRSA infections.    Studies/Results: Dg Chest Portable 1 View  12/15/2012  *RADIOLOGY REPORT*  Clinical Data: Shortness of breath.  PORTABLE CHEST - 1 VIEW  Comparison: Single view of the chest 01/13/2012.  CT chest 06/03/2011.  Findings: The lungs appear severely emphysematous but are clear. No pneumothorax or pleural fluid is identified.  Heart size is normal.  IMPRESSION: Severe emphysema without acute disease.   Original Report Authenticated By: Holley Dexter, M.D.     Medications:  Prior to Admission:  Prescriptions prior to admission  Medication Sig Dispense Refill  . albuterol (PROAIR HFA) 108 (90 BASE) MCG/ACT inhaler Inhale 2 puffs into the lungs every 6 (six) hours as needed. Shortness of Breath      . ALPRAZolam (XANAX) 0.25 MG tablet Take 0.25 mg by mouth 3 (three) times daily as needed for anxiety.      . fexofenadine-pseudoephedrine (ALLEGRA-D 24) 180-240 MG per 24 hr tablet Take 1 tablet by mouth daily.      [name] guaiFENesin (MUCINEX) 600 MG 12 hr  tablet Take 1,200 mg by mouth daily. congestion      . ibuprofen (ADVIL,MOTRIN) 800 MG tablet Take 800 mg by mouth daily as needed. For pain      . ipratropium-albuterol (DUONEB) 0.5-2.5 (3) MG/3ML SOLN Take 3 mLs by nebulization every 4 (four) hours as needed. For shortness of breath      . metFORMIN (GLUCOPHAGE) 1000 MG tablet Take 1 tablet (1,000 mg total) by mouth 2 (two) times daily.  60 tablet  5  . montelukast (SINGULAIR) 10  MG tablet Take 10 mg by mouth at bedtime.        . predniSONE (DELTASONE) 10 MG tablet Take 20-30 mg by mouth daily.      . rosuvastatin (CRESTOR) 10 MG tablet Take 10 mg by mouth daily.       Scheduled: . [COMPLETED] sodium chloride   Intravenous STAT  . albuterol  2.5 mg Nebulization Q4H  . antiseptic oral rinse  15 mL Mouth Rinse BID  . atorvastatin  20 mg Oral q1800  . guaiFENesin  1,200 mg Oral BID  . ipratropium  0.5 mg Nebulization Q4H  . levofloxacin (LEVAQUIN) IV  750 mg Intravenous Q24H  . loratadine  10 mg Oral Daily  . metFORMIN  1,000 mg Oral BID WC  . methylPREDNISolone (SOLU-MEDROL) injection  125 mg Intravenous Q6H  . montelukast  10 mg Oral QHS   Continuous:  ZOX:[id number], ALPRAZolam  Assesment:  He has acute on chronic respiratory failure. He has trouble with anxiety. He is improved and has been able to be switched from BiPAP to nasal cannula. In the past he has had very rapid improvement when he has these episodes. He has had to be intubated on at least 2 occasions.  Active Problems:   * No active hospital problems. *    Plan: Continue with IV antibiotics steroids  bronchodilators. I will place a nicotine patch. Blood gas shows his pCo2  Is still very elevated. He is still critically ill    LOS: 1 day   Azaylia Fong L 12/16/2012, 9:42 AM

## 2012-12-17 LAB — GLUCOSE, CAPILLARY
Glucose-Capillary: 122 mg/dL — ABNORMAL HIGH (ref 70–99)
Glucose-Capillary: 152 mg/dL — ABNORMAL HIGH (ref 70–99)
Glucose-Capillary: 121 mg/dL — ABNORMAL HIGH (ref 70–99)

## 2012-12-17 NOTE — Progress Notes (Signed)
Subjective: The patient is feeling some better today he was on BiPAP again yesterday for a period of time and is back on 2-3 L per minute. He is having less respiratory distress but does have significant COPD.  Objective: Vital signs in last 24 hours: Temp:  [97.7 F (36.5 C)-98.7 F (37.1 C)] 97.8 F (36.6 C) (03/16 0402) Pulse Rate:  [89-116] 90 (03/16 0600) Resp:  [13-26] 21 (03/16 0600) BP: (112-149)/(73-84) 130/74 mmHg (03/16 0600) SpO2:  [94 %-100 %] 98 % (03/16 0720) FiO2 (%):  [30 %] 30 % (03/16 0720) Weight:  [81.5 kg (179 lb 10.8 oz)] 81.5 kg (179 lb 10.8 oz) (03/16 0500) Weight change: 8.925 kg (19 lb 10.8 oz) Last BM Date: 12/16/12  Intake/Output from previous day: 03/15 0701 - 03/16 0700 In: 1390 [P.O.:340; I.V.:900; IV Piggyback:150] Out: 850 [Urine:850] Intake/Output this shift:    Physical Exam: The patient is alert and oriented  Blood pressure 130/74 respiration 21 pulse 90 temp 97.8  HEENT negative  Neck supple no JVD or thyroid abnormalities  Lungs diminished breath sounds heart regular rhythm  Abdomen no palpable organs or masses  Extremities free of edema   Recent Labs  12/15/12 0947  WBC 6.6  HGB 15.0  HCT 47.8  PLT 211   BMET  Recent Labs  12/15/12 0947  NA 136  K 4.7  CL 91*  CO2 39*  GLUCOSE 175*  BUN 8  CREATININE 0.94  CALCIUM 9.9    Studies/Results: Dg Chest Portable 1 View  12/15/2012  *RADIOLOGY REPORT*  Clinical Data: Shortness of breath.  PORTABLE CHEST - 1 VIEW  Comparison: Single view of the chest 01/13/2012.  CT chest 06/03/2011.  Findings: The lungs appear severely emphysematous but are clear. No pneumothorax or pleural fluid is identified.  Heart size is normal.  IMPRESSION: Severe emphysema without acute disease.   Original Report Authenticated By: Holley Dexter, M.D.     Medications:  . albuterol  2.5 mg Nebulization Q4H  . antiseptic oral rinse  15 mL Mouth Rinse BID  . atorvastatin  20 mg Oral q1800   . enoxaparin (LOVENOX) injection  40 mg Subcutaneous Daily  . guaiFENesin  1,200 mg Oral BID  . ipratropium  0.5 mg Nebulization Q4H  . levofloxacin (LEVAQUIN) IV  750 mg Intravenous Q24H  . loratadine  10 mg Oral Daily  . metFORMIN  1,000 mg Oral BID WC  . methylPREDNISolone (SOLU-MEDROL) injection  125 mg Intravenous Q6H  . montelukast  10 mg Oral QHS  . nicotine  21 mg Transdermal Daily        Assessment/Plan: The patient has COPD with acute on chronic respiratory failure hypoxia and hypercapnia. His CO2 did go down to 69. We will repeat blood gases this morning and continue current regimen.   LOS: 2 days   Shir Bergman G 12/17/2012, 7:41 AM

## 2012-12-17 NOTE — Progress Notes (Signed)
Subjective: He says he feels better. His oxygen requirement is decreasing. He has less shortness of breath. He was on BiPAP again last night but is back on nasal cannula now.  Objective: Vital signs in last 24 hours: Temp:  [97.8 F (36.6 C)-98.7 F (37.1 C)] 98.1 F (36.7 C) (03/16 0830) Pulse Rate:  [89-116] 90 (03/16 0600) Resp:  [13-26] 21 (03/16 0600) BP: (112-147)/(73-84) 130/74 mmHg (03/16 0600) SpO2:  [95 %-100 %] 98 % (03/16 0720) FiO2 (%):  [30 %] 30 % (03/16 0720) Weight:  [81.5 kg (179 lb 10.8 oz)] 81.5 kg (179 lb 10.8 oz) (03/16 0500) Weight change: 8.925 kg (19 lb 10.8 oz) Last BM Date: 12/16/12  Intake/Output from previous day: 03/15 0701 - 03/16 0700 In: 1390 [P.O.:340; I.V.:900; IV Piggyback:150] Out: 850 [Urine:850]  PHYSICAL EXAM General appearance: alert, cooperative and mild distress Resp: rhonchi bilaterally and Prolonged expiration Cardio: regular rate and rhythm, S1, S2 normal, no murmur, click, rub or gallop GI: soft, non-tender; bowel sounds normal; no masses,  no organomegaly Extremities: extremities normal, atraumatic, no cyanosis or edema  Lab Results:    Basic Metabolic Panel:  Recent Labs  16/10/96 0947  NA 136  K 4.7  CL 91*  CO2 39*  GLUCOSE 175*  BUN 8  CREATININE 0.94  CALCIUM 9.9   Liver Function Tests: No results found for this basename: AST, ALT, ALKPHOS, BILITOT, PROT, ALBUMIN,  in the last 72 hours No results found for this basename: LIPASE, AMYLASE,  in the last 72 hours No results found for this basename: AMMONIA,  in the last 72 hours CBC:  Recent Labs  12/15/12 0947  WBC 6.6  NEUTROABS 5.6  HGB 15.0  HCT 47.8  MCV 95.2  PLT 211   Cardiac Enzymes:  Recent Labs  12/15/12 1000  TROPONINI <0.30   BNP: No results found for this basename: PROBNP,  in the last 72 hours D-Dimer: No results found for this basename: DDIMER,  in the last 72 hours CBG:  Recent Labs  12/15/12 2045 12/16/12 0738  12/16/12 1147 12/16/12 1630 12/16/12 2151 12/17/12 0802  GLUCAP 158* 159* 136* 164* 159* 126*   Hemoglobin A1C: No results found for this basename: HGBA1C,  in the last 72 hours Fasting Lipid Panel: No results found for this basename: CHOL, HDL, LDLCALC, TRIG, CHOLHDL, LDLDIRECT,  in the last 72 hours Thyroid Function Tests: No results found for this basename: TSH, T4TOTAL, FREET4, T3FREE, THYROIDAB,  in the last 72 hours Anemia Panel: No results found for this basename: VITAMINB12, FOLATE, FERRITIN, TIBC, IRON, RETICCTPCT,  in the last 72 hours Coagulation: No results found for this basename: LABPROT, INR,  in the last 72 hours Urine Drug Screen: Drugs of Abuse  No results found for this basename: labopia, cocainscrnur, labbenz, amphetmu, thcu, labbarb    Alcohol Level: No results found for this basename: ETH,  in the last 72 hours Urinalysis: No results found for this basename: COLORURINE, APPERANCEUR, LABSPEC, PHURINE, GLUCOSEU, HGBUR, BILIRUBINUR, KETONESUR, PROTEINUR, UROBILINOGEN, NITRITE, LEUKOCYTESUR,  in the last 72 hours Misc. Labs:  ABGS  Recent Labs  12/16/12 1030  PHART 7.344*  PO2ART 72.9*  TCO2 33.1  HCO3 36.6*   CULTURES Recent Results (from the past 240 hour(s))  MRSA PCR SCREENING     Status: None   Collection Time    12/15/12  6:30 PM      Result Value Range Status   MRSA by PCR NEGATIVE  NEGATIVE Final   Comment:  The GeneXpert MRSA Assay (FDA     approved for NASAL specimens     only), is one component of a     comprehensive MRSA colonization     surveillance program. It is not     intended to diagnose MRSA     infection nor to guide or     monitor treatment for     MRSA infections.   Studies/Results: Dg Chest Portable 1 View  12/15/2012  *RADIOLOGY REPORT*  Clinical Data: Shortness of breath.  PORTABLE CHEST - 1 VIEW  Comparison: Single view of the chest 01/13/2012.  CT chest 06/03/2011.  Findings: The lungs appear severely  emphysematous but are clear. No pneumothorax or pleural fluid is identified.  Heart size is normal.  IMPRESSION: Severe emphysema without acute disease.   Original Report Authenticated By: Holley Dexter, M.D.     Medications:  Scheduled: . albuterol  2.5 mg Nebulization Q4H  . antiseptic oral rinse  15 mL Mouth Rinse BID  . atorvastatin  20 mg Oral q1800  . enoxaparin (LOVENOX) injection  40 mg Subcutaneous Daily  . guaiFENesin  1,200 mg Oral BID  . ipratropium  0.5 mg Nebulization Q4H  . levofloxacin (LEVAQUIN) IV  750 mg Intravenous Q24H  . loratadine  10 mg Oral Daily  . metFORMIN  1,000 mg Oral BID WC  . methylPREDNISolone (SOLU-MEDROL) injection  125 mg Intravenous Q6H  . montelukast  10 mg Oral QHS  . nicotine  21 mg Transdermal Daily   Continuous:  ZOX:WRUEAVWUJ, ALPRAZolam  Assesment: He was admitted with acute on chronic respiratory failure. His PCO2 was around 100. He is much improved. His oxygen requirements decreasing. He has end-stage COPD and has not been able to stop smoking. Active Problems:   * No active hospital problems. *    Plan: Continue current treatments. He is improving on current regimen. I think he can probably get up and move around a little bit and see how he does    LOS: 2 days   Jeff Ray 12/17/2012, 9:20 AM

## 2012-12-17 NOTE — Progress Notes (Signed)
Pt has been off BiPAP since beginning of shift, will let him stay off as tolerated and maybe sleep with Machine tonight. Expect am ABG to be Panic PCO2 as I believe this is his norm. Other wise PT looks well. He also most likely always wheezes?

## 2012-12-18 LAB — GLUCOSE, CAPILLARY: Glucose-Capillary: 158 mg/dL — ABNORMAL HIGH (ref 70–99)

## 2012-12-18 LAB — BLOOD GAS, ARTERIAL
Acid-Base Excess: 11.1 mmol/L — ABNORMAL HIGH (ref 0.0–2.0)
Bicarbonate: 36.4 mEq/L — ABNORMAL HIGH (ref 20.0–24.0)
Delivery systems: POSITIVE
FIO2: 0.28 %
TCO2: 32.6 mmol/L (ref 0–100)
pCO2 arterial: 61.5 mmHg (ref 35.0–45.0)
pO2, Arterial: 79.3 mmHg — ABNORMAL LOW (ref 80.0–100.0)

## 2012-12-18 MED ORDER — LEVOFLOXACIN 750 MG PO TABS
750.0000 mg | ORAL_TABLET | Freq: Every day | ORAL | Status: DC
  Administered 2012-12-18 – 2012-12-21 (×4): 750 mg via ORAL
  Filled 2012-12-18 (×4): qty 1

## 2012-12-18 NOTE — Progress Notes (Signed)
UR Chart Review Completed  

## 2012-12-18 NOTE — Progress Notes (Signed)
ANTIBIOTIC CONSULT NOTE   Pharmacy Consult for Levaquin Indication: rule out pneumonia, SOB  No Known Allergies  Patient Measurements: Height: 5\' 6"  (167.6 cm) Weight: 179 lb 10.8 oz (81.5 kg) IBW/kg (Calculated) : 63.8  Vital Signs: Temp: 97.8 F (36.6 C) (03/17 0800) Temp src: Oral (03/17 0800) BP: 134/64 mmHg (03/17 0900) Pulse Rate: 83 (03/17 0700) Intake/Output from previous day: 03/16 0701 - 03/17 0700 In: 370 [P.O.:220; IV Piggyback:150] Out: 1050 [Urine:1050] Intake/Output from this shift:    Labs: No results found for this basename: WBC, HGB, PLT, LABCREA, CREATININE,  in the last 72 hours Estimated Creatinine Clearance: 85.9 ml/min (by C-G formula based on Cr of 0.94). No results found for this basename: VANCOTROUGH, Leodis Binet, VANCORANDOM, GENTTROUGH, GENTPEAK, GENTRANDOM, TOBRATROUGH, TOBRAPEAK, TOBRARND, AMIKACINPEAK, AMIKACINTROU, AMIKACIN,  in the last 72 hours   Microbiology: Recent Results (from the past 720 hour(s))  MRSA PCR SCREENING     Status: None   Collection Time    12/15/12  6:30 PM      Result Value Range Status   MRSA by PCR NEGATIVE  NEGATIVE Final   Comment:            The GeneXpert MRSA Assay (FDA     approved for NASAL specimens     only), is one component of a     comprehensive MRSA colonization     surveillance program. It is not     intended to diagnose MRSA     infection nor to guide or     monitor treatment for     MRSA infections.   Medical History: Past Medical History  Diagnosis Date  . COPD (chronic obstructive pulmonary disease)   . Asthma   . Hypertension   . Anxiety   . Diabetes mellitus    Medications:  Scheduled:  . albuterol  2.5 mg Nebulization Q4H  . antiseptic oral rinse  15 mL Mouth Rinse BID  . atorvastatin  20 mg Oral q1800  . enoxaparin (LOVENOX) injection  40 mg Subcutaneous Daily  . guaiFENesin  1,200 mg Oral BID  . ipratropium  0.5 mg Nebulization Q4H  . levofloxacin  750 mg Oral Daily  .  loratadine  10 mg Oral Daily  . metFORMIN  1,000 mg Oral BID WC  . methylPREDNISolone (SOLU-MEDROL) injection  125 mg Intravenous Q6H  . montelukast  10 mg Oral QHS  . nicotine  21 mg Transdermal Daily  . [DISCONTINUED] levofloxacin (LEVAQUIN) IV  750 mg Intravenous Q24H   Assessment: 57yo male with SOB.  Pt has good renal fxn.  Estimated Creatinine Clearance: 85.9 ml/min (by C-G formula based on Cr of 0.94).  Goal of Therapy:  Eradicate infection.  Plan:  Levaquin 750mg  PO q24hrs  PHARMACIST - PHYSICIAN COMMUNICATION DR:   Juanetta Gosling CONCERNING: Antibiotic IV to Oral Route Change Policy  RECOMMENDATION: This patient is receiving Levaquin by the intravenous route.  Based on criteria approved by the Pharmacy and Therapeutics Committee, the antibiotic(s) is/are being converted to the equivalent oral dose form(s).  DESCRIPTION: These criteria include:  Patient being treated for a respiratory tract infection, urinary tract infection, or cellulitis  The patient is not neutropenic and does not exhibit a GI malabsorption state  The patient is eating (either orally or via tube) and/or has been taking other orally administered medications for a least 24 hours  The patient is improving clinically and has a Tmax < 100.5  If you have questions about this conversion, please contact the Pharmacy  Department  [x]   [phone] )  [facility] []   [phone] )  [facility]  []   [phone] )  [facility] []   [phone] )  [facility] [place], [place] A 12/18/2012,12:00 PM

## 2012-12-18 NOTE — Progress Notes (Signed)
Subjective: He is much improved. He has no new complaints. His shortness of breath is better. He did use BiPAP last night apparently but is off this morning and looks very comfortable  Objective: Vital signs in last 24 hours: Temp:  [97.9 F (36.6 C)-98.6 F (37 C)] 98.1 F (36.7 C) (03/17 0400) Pulse Rate:  [74-108] 74 (03/17 0600) Resp:  [12-27] 14 (03/17 0600) BP: (116-142)/(49-90) 141/80 mmHg (03/17 0600) SpO2:  [93 %-100 %] 99 % (03/17 0600) FiO2 (%):  [28 %] 28 % (03/17 0400) Weight change:  Last BM Date: 12/17/12  Intake/Output from previous day: 03/16 0701 - 03/17 0700 In: 370 [P.O.:220; IV Piggyback:150] Out: 550 [Urine:550]  PHYSICAL EXAM General appearance: alert, cooperative and mild distress Resp: Minimal end expiratory wheezes bilaterally Cardio: regular rate and rhythm, S1, S2 normal, no murmur, click, rub or gallop GI: soft, non-tender; bowel sounds normal; no masses,  no organomegaly Extremities: extremities normal, atraumatic, no cyanosis or edema  Lab Results:    Basic Metabolic Panel:  Recent Labs  16/10/96 0947  NA 136  K 4.7  CL 91*  CO2 39*  GLUCOSE 175*  BUN 8  CREATININE 0.94  CALCIUM 9.9   Liver Function Tests: No results found for this basename: AST, ALT, ALKPHOS, BILITOT, PROT, ALBUMIN,  in the last 72 hours No results found for this basename: LIPASE, AMYLASE,  in the last 72 hours No results found for this basename: AMMONIA,  in the last 72 hours CBC:  Recent Labs  12/15/12 0947  WBC 6.6  NEUTROABS 5.6  HGB 15.0  HCT 47.8  MCV 95.2  PLT 211   Cardiac Enzymes:  Recent Labs  12/15/12 1000  TROPONINI <0.30   BNP: No results found for this basename: PROBNP,  in the last 72 hours D-Dimer: No results found for this basename: DDIMER,  in the last 72 hours CBG:  Recent Labs  12/16/12 1630 12/16/12 2151 12/17/12 0802 12/17/12 1121 12/17/12 1651 12/17/12 2157  GLUCAP 164* 159* 126* 122* 152* 121*   Hemoglobin  A1C: No results found for this basename: HGBA1C,  in the last 72 hours Fasting Lipid Panel: No results found for this basename: CHOL, HDL, LDLCALC, TRIG, CHOLHDL, LDLDIRECT,  in the last 72 hours Thyroid Function Tests: No results found for this basename: TSH, T4TOTAL, FREET4, T3FREE, THYROIDAB,  in the last 72 hours Anemia Panel: No results found for this basename: VITAMINB12, FOLATE, FERRITIN, TIBC, IRON, RETICCTPCT,  in the last 72 hours Coagulation: No results found for this basename: LABPROT, INR,  in the last 72 hours Urine Drug Screen: Drugs of Abuse  No results found for this basename: labopia, cocainscrnur, labbenz, amphetmu, thcu, labbarb    Alcohol Level: No results found for this basename: ETH,  in the last 72 hours Urinalysis: No results found for this basename: COLORURINE, APPERANCEUR, LABSPEC, PHURINE, GLUCOSEU, HGBUR, BILIRUBINUR, KETONESUR, PROTEINUR, UROBILINOGEN, NITRITE, LEUKOCYTESUR,  in the last 72 hours Misc. Labs:  ABGS  Recent Labs  12/18/12 0554  PHART 7.390  PO2ART 79.3*  TCO2 32.6  HCO3 36.4*   CULTURES Recent Results (from the past 240 hour(s))  MRSA PCR SCREENING     Status: None   Collection Time    12/15/12  6:30 PM      Result Value Range Status   MRSA by PCR NEGATIVE  NEGATIVE Final   Comment:            The GeneXpert MRSA Assay (FDA     approved for  NASAL specimens     only), is one component of a     comprehensive MRSA colonization     surveillance program. It is not     intended to diagnose MRSA     infection nor to guide or     monitor treatment for     MRSA infections.   Studies/Results: No results found.  Medications:  Prior to Admission:  Prescriptions prior to admission  Medication Sig Dispense Refill  . albuterol (PROAIR HFA) 108 (90 BASE) MCG/ACT inhaler Inhale 2 puffs into the lungs every 6 (six) hours as needed. Shortness of Breath      . ALPRAZolam (XANAX) 0.25 MG tablet Take 0.25 mg by mouth 3 (three) times daily  as needed for anxiety.      . fexofenadine-pseudoephedrine (ALLEGRA-D 24) 180-240 MG per 24 hr tablet Take 1 tablet by mouth daily.      [name] guaiFENesin (MUCINEX) 600 MG 12 hr tablet Take 1,200 mg by mouth daily. congestion      . ibuprofen (ADVIL,MOTRIN) 800 MG tablet Take 800 mg by mouth daily as needed. For pain      . ipratropium-albuterol (DUONEB) 0.5-2.5 (3) MG/3ML SOLN Take 3 mLs by nebulization every 4 (four) hours as needed. For shortness of breath      . metFORMIN (GLUCOPHAGE) 1000 MG tablet Take 1 tablet (1,000 mg total) by mouth 2 (two) times daily.  60 tablet  5  . montelukast (SINGULAIR) 10 MG tablet Take 10 mg by mouth at bedtime.        . predniSONE (DELTASONE) 10 MG tablet Take 20-30 mg by mouth daily.      . rosuvastatin (CRESTOR) 10 MG tablet Take 10 mg by mouth daily.       Scheduled: . albuterol  2.5 mg Nebulization Q4H  . antiseptic oral rinse  15 mL Mouth Rinse BID  . atorvastatin  20 mg Oral q1800  . enoxaparin (LOVENOX) injection  40 mg Subcutaneous Daily  . guaiFENesin  1,200 mg Oral BID  . ipratropium  0.5 mg Nebulization Q4H  . levofloxacin (LEVAQUIN) IV  750 mg Intravenous Q24H  . loratadine  10 mg Oral Daily  . metFORMIN  1,000 mg Oral BID WC  . methylPREDNISolone (SOLU-MEDROL) injection  125 mg Intravenous Q6H  . montelukast  10 mg Oral QHS  . nicotine  21 mg Transdermal Daily   Continuous:  ZOX:WRUEAVWUJ, ALPRAZolam  Assesment: He has acute on chronic respiratory failure with COPD exacerbation. He is much improved. I think she's okay to move out of the ICU now Active Problems:   * No active hospital problems. *    Plan: Transfer from ICU    LOS: 3 days   Gumecindo Hopkin L 12/18/2012, 7:48 AM

## 2012-12-18 NOTE — Progress Notes (Signed)
Patient transferred to Dept. 300 via wheelchair.  

## 2012-12-18 NOTE — Progress Notes (Signed)
Subjective: The patient is feeling some better today and he was on BiPAP this a.m. he is tolerating it well and is fairly comfortable  Objective: Vital signs in last 24 hours: Temp:  [97.9 F (36.6 C)-98.6 F (37 C)] 98.1 F (36.7 C) (03/17 0400) Pulse Rate:  [74-108] 74 (03/17 0600) Resp:  [12-27] 14 (03/17 0600) BP: (116-142)/(49-90) 141/80 mmHg (03/17 0600) SpO2:  [93 %-100 %] 99 % (03/17 0600) FiO2 (%):  [28 %-30 %] 28 % (03/17 0400) Weight change:  Last BM Date: 12/17/12  Intake/Output from previous day: 03/16 0701 - 03/17 0700 In: 370 [P.O.:220; IV Piggyback:150] Out: 550 [Urine:550] Intake/Output this shift: Total I/O In: -  Out: 250 [Urine:250]  Physical Exam: Patient is alert and oriented  Blood pressure 141/80 respiration 17 pulse 74 temp 98.1  HEENT negative  Neck supple no JVD or thyroid abnormalities  Lungs diminished breath sounds bilaterally heart regular rhythm  Abdomen no palpable organs or masses  Extremities free of edema   Recent Labs  12/15/12 0947  WBC 6.6  HGB 15.0  HCT 47.8  PLT 211   BMET  Recent Labs  12/15/12 0947  NA 136  K 4.7  CL 91*  CO2 39*  GLUCOSE 175*  BUN 8  CREATININE 0.94  CALCIUM 9.9    Studies/Results: No results found.  Medications:  . albuterol  2.5 mg Nebulization Q4H  . antiseptic oral rinse  15 mL Mouth Rinse BID  . atorvastatin  20 mg Oral q1800  . enoxaparin (LOVENOX) injection  40 mg Subcutaneous Daily  . guaiFENesin  1,200 mg Oral BID  . ipratropium  0.5 mg Nebulization Q4H  . levofloxacin (LEVAQUIN) IV  750 mg Intravenous Q24H  . loratadine  10 mg Oral Daily  . metFORMIN  1,000 mg Oral BID WC  . methylPREDNISolone (SOLU-MEDROL) injection  125 mg Intravenous Q6H  . montelukast  10 mg Oral QHS  . nicotine  21 mg Transdermal Daily        Assessment/Plan: The patient has advanced COPD with acute on chronic respiratory failure and hypoxia hypercapnia. His CO2 continues to go down now is  around 60. Plan to cut continue current meds. The patient will be placed back on nasal cannula today.   LOS: 3 days   Kaan Tosh G 12/18/2012, 6:51 AM

## 2012-12-18 NOTE — Care Management Note (Signed)
    Page 1 of 1   12/21/2012     2:41:07 PM   CARE MANAGEMENT NOTE 12/21/2012  Patient:  Jeff Ray, Jeff Ray   Account Number:  [id number]  Date Initiated:  12/18/2012  Documentation initiated by:  Sharrie Rothman  Subjective/Objective Assessment:   Pt admitted from home with COPD exacerbation. Pt lives with his wife and son and will return home at discharge. Pt is fairly independent with ADL's. Pt has O2 with [employer], cpap with [facility], and electric wheelchair. No HH at present.     Action/Plan:   Will continue to follow for [facility] needs.   Anticipated DC Date:  12/20/2012   Anticipated DC Plan:  HOME/SELF CARE      DC Planning Services  CM consult      Choice offered to / List presented to:             Status of service:  Completed, signed off Medicare Important Message given?  YES (If response is "NO", the following Medicare IM given date fields will be blank) Date Medicare IM given:  12/21/2012 Date Additional Medicare IM given:    Discharge Disposition:  HOME/SELF CARE  Per UR Regulation:    If discussed at Long Length of Stay Meetings, dates discussed:   12/21/2012    Comments:  12/21/12 1440 Arlyss Queen, RN BSN CM Pt for discharge later today. No HH needs noted.  12/18/12 1530 Arlyss Queen, RN BSN CM

## 2012-12-18 NOTE — Progress Notes (Signed)
Pt doing well on 3lpm [place] , place on BiPAP because he sleeps on CPAP at HOME. O2 decreased to 28% -- SATs 98

## 2012-12-18 NOTE — Plan of Care (Signed)
Problem: Phase I Progression Outcomes Goal: O2 sats > or equal 90% or at baseline Outcome: Progressing Baseline oxygen saturation, patient on 3 liters nasal canula, on 3 liters at home, saturation 97% continues productive cough

## 2012-12-19 LAB — GLUCOSE, CAPILLARY
Glucose-Capillary: 151 mg/dL — ABNORMAL HIGH (ref 70–99)
Glucose-Capillary: 191 mg/dL — ABNORMAL HIGH (ref 70–99)

## 2012-12-19 NOTE — Progress Notes (Signed)
Subjective: The patient states that he is feeling some better today his been wearing BiPAP for several hours and is tolerating it well He does have COPD and chronic respiratory failure Objective: Vital signs in last 24 hours: Temp:  [97.7 F (36.5 C)-98.1 F (36.7 C)] 97.7 F (36.5 C) (03/18 0614) Pulse Rate:  [90-95] 90 (03/18 0614) Resp:  [16-24] 16 (03/18 0614) BP: (134-149)/(64-82) 141/82 mmHg (03/18 0614) SpO2:  [95 %-98 %] 97 % (03/18 0614) Weight change:  Last BM Date: 12/17/12  Intake/Output from previous day: 03/17 0701 - 03/18 0700 In: 640 [P.O.:640] Out: 1325 [Urine:1325] Intake/Output this shift:    Physical Exam: The patient is alert and oriented  Vital signs blood pressure 141/82 respiration 16 pulse 90 temp 97.7  HEENT negative  Neck supple no JVD or thyroid abnormalities  Lungs diminished breath sounds bilaterally with irregular rhythm  Abdomen the palpable organs or masses  Extremities free of edema  No results found for this basename: WBC, HGB, HCT, PLT,  in the last 72 hours BMET No results found for this basename: NA, K, CL, CO2, GLUCOSE, BUN, CREATININE, CALCIUM,  in the last 72 hours  Studies/Results: No results found.  Medications:  . albuterol  2.5 mg Nebulization Q4H  . antiseptic oral rinse  15 mL Mouth Rinse BID  . atorvastatin  20 mg Oral q1800  . enoxaparin (LOVENOX) injection  40 mg Subcutaneous Daily  . guaiFENesin  1,200 mg Oral BID  . ipratropium  0.5 mg Nebulization Q4H  . levofloxacin  750 mg Oral Daily  . loratadine  10 mg Oral Daily  . metFORMIN  1,000 mg Oral BID WC  . methylPREDNISolone (SOLU-MEDROL) injection  125 mg Intravenous Q6H  . montelukast  10 mg Oral QHS  . nicotine  21 mg Transdermal Daily        Assessment/Plan: The patient has advanced COPD with acute on chronic respiratory failure hypoxia and hypercapnia. He is doing better with improvement in his blood gases we will continue current therapy and  transition patient over to nasal O2.   LOS: 4 days   Zeffie Bickert G 12/19/2012, 7:27 AM

## 2012-12-19 NOTE — Progress Notes (Signed)
Subjective: He says he feels better. He has no new complaints. His breathing is okay and is not coughing anything up  Objective: Vital signs in last 24 hours: Temp:  [97.7 F (36.5 C)-98.1 F (36.7 C)] 97.7 F (36.5 C) (03/18 1610) Pulse Rate:  [90-95] 90 (03/18 0614) Resp:  [16-24] 16 (03/18 0614) BP: (134-149)/(64-82) 141/82 mmHg (03/18 0614) SpO2:  [93 %-98 %] 93 % (03/18 0728) Weight change:  Last BM Date: 12/18/12  Intake/Output from previous day: 03/17 0701 - 03/18 0700 In: 640 [P.O.:640] Out: 1325 [Urine:1325]  PHYSICAL EXAM General appearance: alert, cooperative and no distress Resp: Decreased breath sounds and prolonged expiration Cardio: regular rate and rhythm, S1, S2 normal, no murmur, click, rub or gallop GI: soft, non-tender; bowel sounds normal; no masses,  no organomegaly Extremities: extremities normal, atraumatic, no cyanosis or edema  Lab Results:    Basic Metabolic Panel: No results found for this basename: NA, K, CL, CO2, GLUCOSE, BUN, CREATININE, CALCIUM, MG, PHOS,  in the last 72 hours Liver Function Tests: No results found for this basename: AST, ALT, ALKPHOS, BILITOT, PROT, ALBUMIN,  in the last 72 hours No results found for this basename: LIPASE, AMYLASE,  in the last 72 hours No results found for this basename: AMMONIA,  in the last 72 hours CBC: No results found for this basename: WBC, NEUTROABS, HGB, HCT, MCV, PLT,  in the last 72 hours Cardiac Enzymes: No results found for this basename: CKTOTAL, CKMB, CKMBINDEX, TROPONINI,  in the last 72 hours BNP: No results found for this basename: PROBNP,  in the last 72 hours D-Dimer: No results found for this basename: DDIMER,  in the last 72 hours CBG:  Recent Labs  12/17/12 1651 12/17/12 2157 12/18/12 0740 12/18/12 1552 12/18/12 2122 12/19/12 0738  GLUCAP 152* 121* 188* 174* 158* 151*   Hemoglobin A1C: No results found for this basename: HGBA1C,  in the last 72 hours Fasting Lipid  Panel: No results found for this basename: CHOL, HDL, LDLCALC, TRIG, CHOLHDL, LDLDIRECT,  in the last 72 hours Thyroid Function Tests: No results found for this basename: TSH, T4TOTAL, FREET4, T3FREE, THYROIDAB,  in the last 72 hours Anemia Panel: No results found for this basename: VITAMINB12, FOLATE, FERRITIN, TIBC, IRON, RETICCTPCT,  in the last 72 hours Coagulation: No results found for this basename: LABPROT, INR,  in the last 72 hours Urine Drug Screen: Drugs of Abuse  No results found for this basename: labopia, cocainscrnur, labbenz, amphetmu, thcu, labbarb    Alcohol Level: No results found for this basename: ETH,  in the last 72 hours Urinalysis: No results found for this basename: COLORURINE, APPERANCEUR, LABSPEC, PHURINE, GLUCOSEU, HGBUR, BILIRUBINUR, KETONESUR, PROTEINUR, UROBILINOGEN, NITRITE, LEUKOCYTESUR,  in the last 72 hours Misc. Labs:  ABGS  Recent Labs  12/18/12 0554  PHART 7.390  PO2ART 79.3*  TCO2 32.6  HCO3 36.4*   CULTURES Recent Results (from the past 240 hour(s))  MRSA PCR SCREENING     Status: None   Collection Time    12/15/12  6:30 PM      Result Value Range Status   MRSA by PCR NEGATIVE  NEGATIVE Final   Comment:            The GeneXpert MRSA Assay (FDA     approved for NASAL specimens     only), is one component of a     comprehensive MRSA colonization     surveillance program. It is not     intended to  diagnose MRSA     infection nor to guide or     monitor treatment for     MRSA infections.   Studies/Results: No results found.  Medications:  Scheduled: . albuterol  2.5 mg Nebulization Q4H  . antiseptic oral rinse  15 mL Mouth Rinse BID  . atorvastatin  20 mg Oral q1800  . enoxaparin (LOVENOX) injection  40 mg Subcutaneous Daily  . guaiFENesin  1,200 mg Oral BID  . ipratropium  0.5 mg Nebulization Q4H  . levofloxacin  750 mg Oral Daily  . loratadine  10 mg Oral Daily  . metFORMIN  1,000 mg Oral BID WC  . methylPREDNISolone  (SOLU-MEDROL) injection  125 mg Intravenous Q6H  . montelukast  10 mg Oral QHS  . nicotine  21 mg Transdermal Daily   Continuous:  ZOX:[id number], ALPRAZolam  Assesment: He was admitted with acute on chronic respiratory failure from COPD exacerbation. He is improving. Active Problems:   * No active hospital problems. *    Plan: Continue medications. It is okay to reduce steroids    LOS: 4 days   Chastelyn Athens L 12/19/2012, 8:48 AM

## 2012-12-20 LAB — GLUCOSE, CAPILLARY: Glucose-Capillary: 149 mg/dL — ABNORMAL HIGH (ref 70–99)

## 2012-12-20 MED ORDER — INSULIN ASPART 100 UNIT/ML ~~LOC~~ SOLN
0.0000 [IU] | Freq: Three times a day (TID) | SUBCUTANEOUS | Status: DC
  Administered 2012-12-20 (×2): 2 [IU] via SUBCUTANEOUS
  Administered 2012-12-21 (×2): 1 [IU] via SUBCUTANEOUS
  Administered 2012-12-21: 2 [IU] via SUBCUTANEOUS

## 2012-12-20 MED ORDER — INSULIN ASPART 100 UNIT/ML ~~LOC~~ SOLN: 0.0000 [IU] | Freq: Every day | SUBCUTANEOUS | Status: DC

## 2012-12-20 MED ORDER — PREDNISONE 20 MG PO TABS
50.0000 mg | ORAL_TABLET | Freq: Every day | ORAL | Status: DC
  Administered 2012-12-20 – 2012-12-21 (×2): 50 mg via ORAL
  Filled 2012-12-20 (×2): qty 2

## 2012-12-20 NOTE — Progress Notes (Signed)
ANTIBIOTIC CONSULT NOTE   Pharmacy Consult for Levaquin Indication: COPD exacerbation  No Known Allergies  Patient Measurements: Height: 5\' 6"  (167.6 cm) Weight: 179 lb 10.8 oz (81.5 kg) IBW/kg (Calculated) : 63.8  Vital Signs: Temp: 97.9 F (36.6 C) (03/19 1610) Temp src: Oral (03/19 9604) BP: 130/79 mmHg (03/19 0633) Pulse Rate: 94 (03/19 0633) Intake/Output from previous day: 03/18 0701 - 03/19 0700 In: 840 [P.O.:840] Out: 1075 [Urine:1075] Intake/Output from this shift: Total I/O In: 240 [P.O.:240] Out: -   Labs: No results found for this basename: WBC, HGB, PLT, LABCREA, CREATININE,  in the last 72 hours Estimated Creatinine Clearance: 85.9 ml/min (by C-G formula based on Cr of 0.94). No results found for this basename: VANCOTROUGH, Leodis Binet, VANCORANDOM, GENTTROUGH, GENTPEAK, GENTRANDOM, TOBRATROUGH, TOBRAPEAK, TOBRARND, AMIKACINPEAK, AMIKACINTROU, AMIKACIN,  in the last 72 hours   Microbiology: Recent Results (from the past 720 hour(s))  MRSA PCR SCREENING     Status: None   Collection Time    12/15/12  6:30 PM      Result Value Range Status   MRSA by PCR NEGATIVE  NEGATIVE Final   Comment:            The GeneXpert MRSA Assay (FDA     approved for NASAL specimens     only), is one component of a     comprehensive MRSA colonization     surveillance program. It is not     intended to diagnose MRSA     infection nor to guide or     monitor treatment for     MRSA infections.   Medical History: Past Medical History  Diagnosis Date  . COPD (chronic obstructive pulmonary disease)   . Asthma   . Hypertension   . Anxiety   . Diabetes mellitus    Medications:  Scheduled:  . albuterol  2.5 mg Nebulization Q4H  . antiseptic oral rinse  15 mL Mouth Rinse BID  . atorvastatin  20 mg Oral q1800  . enoxaparin (LOVENOX) injection  40 mg Subcutaneous Daily  . guaiFENesin  1,200 mg Oral BID  . insulin aspart  0-5 Units Subcutaneous QHS  . insulin aspart  0-9  Units Subcutaneous TID WC  . ipratropium  0.5 mg Nebulization Q4H  . levofloxacin  750 mg Oral Daily  . loratadine  10 mg Oral Daily  . metFORMIN  1,000 mg Oral BID WC  . montelukast  10 mg Oral QHS  . nicotine  21 mg Transdermal Daily  . predniSONE  50 mg Oral Q breakfast  . [DISCONTINUED] methylPREDNISolone (SOLU-MEDROL) injection  125 mg Intravenous Q6H   Assessment: 57yo M on day#6 Levaquin for COPD exacerbation.  He is clinically improving & renal function has been stable.  Goal of Therapy:  Eradicate infection.  Plan:  Continue Levaquin 750mg  PO q24hrs Pharmacy to sign off. Please re-consult as needed  Jeff Ray 12/20/2012,12:52 PM

## 2012-12-20 NOTE — Progress Notes (Signed)
Patient's CBG elevated. Dr. Renard Matter notified. New orders for sliding scale sensitive AC&HS.

## 2012-12-20 NOTE — Progress Notes (Signed)
Subjective: The patient had a fairly comfortable night he remains on nasal oxygen he does have COPD and chronic respiratory failure  Objective: Vital signs in last 24 hours: Temp:  [98.1 F (36.7 C)-98.2 F (36.8 C)] 98.1 F (36.7 C) (03/18 2159) Pulse Rate:  [104-112] 112 (03/18 2314) Resp:  [17-18] 18 (03/18 2314) BP: (123-138)/(83-84) 138/83 mmHg (03/18 2159) SpO2:  [90 %-99 %] 95 % (03/19 0411) Weight change:  Last BM Date: 12/19/12  Intake/Output from previous day: 03/18 0701 - 03/19 0700 In: 840 [P.O.:840] Out: 400 [Urine:400] Intake/Output this shift:    Physical Exam: The patient is alert and oriented  Vital signs blood pressure 138/83 respiration 18 pulse 112 temp 98.1  H. EENT negative  Neck supple no JVD or thyroid abnormalities  Lungs diminished breath sounds bilaterally with irregular heart rhythm  Abdomen the palpable organs or masses  Extremities extremities free of edema  No results found for this basename: WBC, HGB, HCT, PLT,  in the last 72 hours BMET No results found for this basename: NA, K, CL, CO2, GLUCOSE, BUN, CREATININE, CALCIUM,  in the last 72 hours  Studies/Results: No results found.  Medications:  . albuterol  2.5 mg Nebulization Q4H  . antiseptic oral rinse  15 mL Mouth Rinse BID  . atorvastatin  20 mg Oral q1800  . enoxaparin (LOVENOX) injection  40 mg Subcutaneous Daily  . guaiFENesin  1,200 mg Oral BID  . ipratropium  0.5 mg Nebulization Q4H  . levofloxacin  750 mg Oral Daily  . loratadine  10 mg Oral Daily  . metFORMIN  1,000 mg Oral BID WC  . methylPREDNISolone (SOLU-MEDROL) injection  125 mg Intravenous Q6H  . montelukast  10 mg Oral QHS  . nicotine  21 mg Transdermal Daily        Assessment/Plan: The patient has advanced COPD with acute on chronic respiratory failure hypoxia and hypercapnia he is been some better we'll continue current therapy and will attempt to get patient discharged fairly soon since he seems to be  back to baseline   LOS: 5 days   Jeff Ray 12/20/2012, 6:26 AM

## 2012-12-21 LAB — GLUCOSE, CAPILLARY
Glucose-Capillary: 159 mg/dL — ABNORMAL HIGH (ref 70–99)
Glucose-Capillary: 140 mg/dL — ABNORMAL HIGH (ref 70–99)

## 2012-12-21 MED ORDER — LEVOFLOXACIN 750 MG PO TABS: 750.0000 mg | ORAL_TABLET | Freq: Every day | ORAL | Status: DC

## 2012-12-21 NOTE — Progress Notes (Signed)
He says he hopes to go home today. He has no new complaints. I have advised him strongly again to stop smoking. He will use a nicotine patch.  I think he is okay for discharge

## 2012-12-21 NOTE — Progress Notes (Signed)
Patient with orders to be discharge home. Discharge instructions given, patient verbalized understanding. Patient in stable condition upon discharge. Patient left in private vehicle with family.

## 2012-12-22 NOTE — Discharge Summary (Signed)
NAME:  Jeff Ray, Jeff Ray                 ACCOUNT NO.:  [id number]  MEDICAL RECORD NO.:  [id number]  LOCATION:  A329                          FACILITY:  [facility]  PHYSICIAN:  Claudina Oliphant G. Renard Matter, MD   DATE OF BIRTH:  [birth year]  DATE OF ADMISSION:  12/15/2012 DATE OF DISCHARGE:  03/20/2014LH                              DISCHARGE SUMMARY   ADDENDUM:  The patient is being discharged on the following medications. He is to continue his nasal oxygen at 3 liters/minute; Levaquin 750 mg daily, will be called into pharmacy of choice; Singulair 10 mg at bedtime; Mucinex 1200 mg daily; ProAir HFA 2 puffs every 6 hours as needed for shortness of breath; Advil 800 mg every 4 hours as needed for pain; metformin 1000 mg twice daily; DuoNeb 0.5/2.5 3 mL nebulizers every 4 hours as needed for shortness of breath; Crestor 10 mg daily; alprazolam 0.25 mg 3 times a day as needed for anxiety; Allegra-D 180/240 mg every 24 hours; prednisone 10 mg tablets 1 tab t.i.d. for 2 days, 1 tab b.i.d. for 2 days, and 1 tab daily.     Arbadella Kimbler G. Renard Matter, MD     AGM/MEDQ  D:  12/21/2012  T:  12/22/2012  Job:  [id number]

## 2012-12-25 NOTE — H&P (Signed)
NAME:  Jeff Ray, Jeff Ray                 ACCOUNT NO.:  [id number]  MEDICAL RECORD NO.:  [id number]  LOCATION:                                 FACILITY:  PHYSICIAN:  Nuh Lipton G. Renard Matter, MD   DATE OF BIRTH:  1956-09-08  DATE OF ADMISSION:  12/15/2012 DATE OF DISCHARGE:  03/20/2014LH                             HISTORY & PHYSICAL   HISTORY OF PRESENT ILLNESS:  This is a 57 year old patient presented to the emergency room on December 15, 2012, with chief complaint of being shortness of breath.  This patient has chronic end-stage COPD.  He has been hospitalized on several occasions in the past.  He developed a cough and wheezing, which was increasing in severity over about 3 days. He had been using home nebulizers without any relief and this became progressively worse.  He turned up his oxygen to a maximum level. Apparently, he had been smoking.  He was seen and evaluated by ED physician.  His blood gases on admission in the ED showed pH of 7.182 with a pCO2 107, pO2 of 79.5.  He was placed on BiPAP and maintained on this over a period of several hours his PCO2 diminished to 95.5 with a pO2 of 85.5, pH 7.2288.  The patient was subsequently admitted to intensive care for more aggressive treatment if needed.  Chest x-ray was done in emergency room shows severe emphysema without acute disease. His chemistries did show a sodium of 136, potassium 4.7, chloride 91, glucose 75, BUN 8, creatinine 0.94.  CBC did show a WBC of 6600 with hemoglobin 15.0 and hematocrit 47.8.  EKG on admission showed sinus tachycardia with a right bundle-branch block.  He was given IV steroids as well in the emergency department and subsequently was admitted.  SOCIAL HISTORY:  The patient still smokes and does not use alcohol.  PAST MEDICAL HISTORY:  The patient has chronic obstructive pulmonary disease, asthma, hypertension, anxiety, and diabetes mellitus,  PAST SURGICAL HISTORY:  Back surgery.  ALLERGIES:  No known  allergies.  REVIEW OF SYSTEMS:  HEENT:  Negative.  CARDIOPULMONARY:  The patient has had increased cough and wheeze over the previous several days.  No chest pain.  GI:  No nausea, vomiting, or diarrhea.  GU:  No dysuria, hematuria.  PHYSICAL EXAMINATION:  GENERAL:  Uncomfortable male. VITAL SIGNS:  Blood pressure 146/76, pulse rate 117, temp 98.7, respirations 22. HEENT:  Eyes; PERRLA.  TM; negative.  Oropharynx benign. NECK:  Supple.  No JVD or thyroid abnormalities. HEART:  Sinus tachycardia.  No bruits or cardiomegaly. LUNGS:  Diminished breath sounds with scattered wheezes. ABDOMEN:  No palpable organs or masses.  No organomegaly. SKIN:  Warm and dry. EXTREMITIES:  Free of edema.  MEDICATION LIST:  Albuterol inhaler 2 puffs every 6 hours as needed, alprazolam 0.25 mg t.i.d. as needed for anxiety, Allegra-D 1 daily, Mucinex 1200 mg daily, Advil 800 mg daily as needed for pain, DuoNeb treatments every 4 hours as needed for shortness of breath, metformin 1000 mg b.i.d., Singulair 10 mg daily at bedtime, prednisone 20-30 mg daily by mouth, and Crestor 10 mg daily.  ASSESSMENT:  The patient does have advanced emphysema  with hypoxic hypercapnia.  He does have a history of type 2 diabetes.  PLAN:  To admit the patient to ICU for further intensive treatment.  He appears to be improving at the time of this admission, but still may be intubated if things deteriorate.     Aryam Zhan G. Renard Matter, MD     AGM/MEDQ  D:  12/21/2012  T:  12/22/2012  Job:  [id number]

## 2012-12-25 NOTE — Discharge Summary (Signed)
NAME:  Jeff Ray, Jeff Ray                 ACCOUNT NO.:  [id number]  MEDICAL RECORD NO.:  [id number]  LOCATION:                                 FACILITY:  PHYSICIAN:  Rosalie Gelpi G. Renard Matter, MD   DATE OF BIRTH:  [birth year]  DATE OF ADMISSION:  12/15/2012 DATE OF DISCHARGE:  03/20/2014LH                              DISCHARGE SUMMARY   DIAGNOSES:  Respiratory failure, chronic obstructive pulmonary disease, hypoxia, hypercapnia, non-insulin-dependent diabetes.  CONDITION:  Stable and improved at time of discharge.  The patient came into the emergency room with chief problem being persistent cough and wheezing which had been present for approximately 3 days.  He had been using home MDI and nebs without relief.  He would turn his oxygen up to 3 L and was still short of breath.  He did start smoking again because he was stressed out.  The patient was given nebulizer treatments in the emergency department.  A chest x-ray showed severe emphysema without acute disease.  He did have markedly impaired blood gases with a pCO2 of over 500 and there was some concern about having to intubate him.  However with BiPAP use, his pO2 were coming up and CO2 was trending down and it was felt he could be placed in intensive care unit with continued treatments.  PHYSICAL EXAMINATION:  GENERAL/VITAL SIGNS:  Acutely ill male with blood pressure 146/76, pulse 117, temp 98.7, respirations 22. HEENT:  Negative. NECK:  Supple.  No JVD or thyroid abnormalities. HEART:  Heart sound is tachycardia. LUNGS:  Diminished breath sounds bilaterally.  Faint scattered wheezes over lower lung field. HEART:  Regular rhythm. ABDOMEN:  No palpable organs or masses.  No edema.  LABORATORY DATA:  Pertinent, most recent blood gases, his pH 7.390 with pCO2 of 61.5, pO2 79.3.  CBC, WBC 6600 with hemoglobin 15.0, hematocrit 47.8.  Chemistries on admission, sodium 136, potassium 4.7, chloride 91, CO2 of 39, BUN 8, creatinine  0.94.  RADIOLOGY:  Chest x-ray on admission shows severe emphysema without acute disease.  HOSPITAL COURSE:  The patient was placed in ICU on BiPAP initially.  He was continued on IV fluids.  He was continued on following medications, Levaquin 750 mg daily, Glucophage 1000 mg b.i.d., Singulair 10 mg daily, NicoDerm patch 21 mg every 24 hours, Claritin 10 mg daily, Mucinex 1200 mg b.i.d., Lipitor 20 mg daily.  He was continued on NovoLog insulin according to sliding scale.  He was initially placed on IV steroids Solu- Medrol q.6 h.  This was transitioned over to prednisone 50 mg daily as he improved.  The patient did have severe respiratory distress initially with markedly elevated pCO2 on low oxygen levels, but this gradually improved, and he was able to be transitioned over to nasal oxygen via catheter.  The patient was seen in consultation by Pulmonology Dr. Juanetta Gosling.  He did have Proventil nebulization treatments 2.5 mg every 4 hours.  Dr. Juanetta Gosling agreed with the treatments.  He gradually improved to the point we felt that he could be discharged.     Rosio Weiss G. Renard Matter, MD     AGM/MEDQ  D:  12/21/2012  T:  12/21/2012  Job:  [id number]

## 2013-05-26 ENCOUNTER — Inpatient Hospital Stay (HOSPITAL_COMMUNITY)
Admission: EM | Admit: 2013-05-26 | Discharge: 2013-06-03 | DRG: 208 | Disposition: A | Discharge status: Home Health | Payer: Medicare HMO | Attending: Family Medicine | Admitting: Family Medicine

## 2013-05-26 ENCOUNTER — Encounter (HOSPITAL_COMMUNITY): Payer: Self-pay

## 2013-05-26 ENCOUNTER — Emergency Department (HOSPITAL_COMMUNITY): Payer: Medicare HMO

## 2013-05-26 DIAGNOSIS — J449 Chronic obstructive pulmonary disease, unspecified: Secondary | ICD-10-CM

## 2013-05-26 DIAGNOSIS — J9600 Acute respiratory failure, unspecified whether with hypoxia or hypercapnia: Secondary | ICD-10-CM

## 2013-05-26 DIAGNOSIS — J9620 Acute and chronic respiratory failure, unspecified whether with hypoxia or hypercapnia: Principal | ICD-10-CM | POA: Diagnosis present

## 2013-05-26 DIAGNOSIS — I10 Essential (primary) hypertension: Secondary | ICD-10-CM

## 2013-05-26 DIAGNOSIS — J441 Chronic obstructive pulmonary disease with (acute) exacerbation: Secondary | ICD-10-CM

## 2013-05-26 DIAGNOSIS — IMO0002 Reserved for concepts with insufficient information to code with codable children: Secondary | ICD-10-CM

## 2013-05-26 DIAGNOSIS — J189 Pneumonia, unspecified organism: Secondary | ICD-10-CM | POA: Diagnosis present

## 2013-05-26 DIAGNOSIS — F17200 Nicotine dependence, unspecified, uncomplicated: Secondary | ICD-10-CM | POA: Diagnosis present

## 2013-05-26 DIAGNOSIS — F411 Generalized anxiety disorder: Secondary | ICD-10-CM | POA: Diagnosis present

## 2013-05-26 DIAGNOSIS — E119 Type 2 diabetes mellitus without complications: Secondary | ICD-10-CM

## 2013-05-26 DIAGNOSIS — Z720 Tobacco use: Secondary | ICD-10-CM | POA: Diagnosis present

## 2013-05-26 DIAGNOSIS — Z79899 Other long term (current) drug therapy: Secondary | ICD-10-CM

## 2013-05-26 DIAGNOSIS — J9622 Acute and chronic respiratory failure with hypercapnia: Secondary | ICD-10-CM | POA: Diagnosis present

## 2013-05-26 LAB — BLOOD GAS, ARTERIAL
Bicarbonate: 41.1 mEq/L — ABNORMAL HIGH (ref 20.0–24.0)
FIO2: 100 %
TCO2: 38.4 mmol/L (ref 0–100)
pCO2 arterial: 110 mmHg (ref 35.0–45.0)
pH, Arterial: 7.198 — CL (ref 7.350–7.450)
pO2, Arterial: 520 mmHg — ABNORMAL HIGH (ref 80.0–100.0)

## 2013-05-26 LAB — COMPREHENSIVE METABOLIC PANEL
AST: 16 U/L (ref 0–37)
Albumin: 3.2 g/dL — ABNORMAL LOW (ref 3.5–5.2)
Alkaline Phosphatase: 89 U/L (ref 39–117)
BUN: 10 mg/dL (ref 6–23)
CO2: 40 mEq/L (ref 19–32)
Chloride: 93 mEq/L — ABNORMAL LOW (ref 96–112)
Potassium: 5.2 mEq/L — ABNORMAL HIGH (ref 3.5–5.1)
Total Bilirubin: <0.1 mg/dL — ABNORMAL LOW (ref 0.3–1.2)

## 2013-05-26 LAB — CBC WITH DIFFERENTIAL/PLATELET
Basophils Absolute: 0 10*3/uL (ref 0.0–0.1)
HCT: 44.8 % (ref 39.0–52.0)
Hemoglobin: 13.5 g/dL (ref 13.0–17.0)
Lymphocytes Relative: 12 % (ref 12–46)
Lymphs Abs: 0.8 10*3/uL (ref 0.7–4.0)
Monocytes Absolute: 0.2 10*3/uL (ref 0.1–1.0)
Neutro Abs: 5.3 10*3/uL (ref 1.7–7.7)
RBC: 4.59 MIL/uL (ref 4.22–5.81)
RDW: 12.7 % (ref 11.5–15.5)
WBC: 6.3 10*3/uL (ref 4.0–10.5)

## 2013-05-26 LAB — PRO B NATRIURETIC PEPTIDE: Pro B Natriuretic peptide (BNP): 235.9 pg/mL — ABNORMAL HIGH (ref 0–125)

## 2013-05-26 LAB — TROPONIN I: Troponin I: <0.3 ng/mL (ref <0.30)

## 2013-05-26 MED ORDER — SODIUM CHLORIDE 0.9 % IV SOLN
25.0000 ug/h | INTRAVENOUS | Status: DC
  Administered 2013-05-26 – 2013-05-28 (×2): 50 ug/h via INTRAVENOUS
  Filled 2013-05-26 (×2): qty 50

## 2013-05-26 MED ORDER — PANTOPRAZOLE SODIUM 40 MG IV SOLR
40.0000 mg | Freq: Every day | INTRAVENOUS | Status: DC
  Administered 2013-05-26 – 2013-05-30 (×5): 40 mg via INTRAVENOUS
  Filled 2013-05-26 (×5): qty 40

## 2013-05-26 MED ORDER — INSULIN ASPART 100 UNIT/ML ~~LOC~~ SOLN: 0.0000 [IU] | Freq: Every day | SUBCUTANEOUS | Status: DC

## 2013-05-26 MED ORDER — PROPOFOL 10 MG/ML IV EMUL
5.0000 ug/kg/min | INTRAVENOUS | Status: DC
  Administered 2013-05-26: 20 ug/kg/min via INTRAVENOUS
  Administered 2013-05-27 – 2013-05-29 (×2): 10 ug/kg/min via INTRAVENOUS
  Filled 2013-05-26 (×5): qty 100

## 2013-05-26 MED ORDER — METHYLPREDNISOLONE SODIUM SUCC 125 MG IJ SOLR: 125.0000 mg | Freq: Four times a day (QID) | INTRAMUSCULAR | Status: DC

## 2013-05-26 MED ORDER — HEPARIN SODIUM (PORCINE) 5000 UNIT/ML IJ SOLN
5000.0000 [IU] | Freq: Three times a day (TID) | INTRAMUSCULAR | Status: DC
  Administered 2013-05-26 – 2013-06-03 (×22): 5000 [IU] via SUBCUTANEOUS
  Filled 2013-05-26 (×24): qty 1

## 2013-05-26 MED ORDER — PROPOFOL 10 MG/ML IV EMUL
INTRAVENOUS | Status: AC
  Filled 2013-05-26: qty 100

## 2013-05-26 MED ORDER — SODIUM CHLORIDE 0.9 % IV SOLN
INTRAVENOUS | Status: DC
  Administered 2013-05-26 – 2013-05-28 (×4): via INTRAVENOUS
  Administered 2013-05-29: 1000 mL via INTRAVENOUS
  Administered 2013-05-29 – 2013-06-01 (×6): via INTRAVENOUS

## 2013-05-26 MED ORDER — DEXTROSE 5 % IV SOLN
INTRAVENOUS | Status: AC
  Filled 2013-05-26: qty 500

## 2013-05-26 MED ORDER — ETOMIDATE 2 MG/ML IV SOLN
INTRAVENOUS | Status: AC
  Filled 2013-05-26: qty 20

## 2013-05-26 MED ORDER — LIDOCAINE HCL (CARDIAC) 20 MG/ML IV SOLN
INTRAVENOUS | Status: AC
  Filled 2013-05-26: qty 5

## 2013-05-26 MED ORDER — PROPOFOL 10 MG/ML IV EMUL
5.0000 ug/kg/min | INTRAVENOUS | Status: DC
  Administered 2013-05-26: 5 ug/kg/min via INTRAVENOUS

## 2013-05-26 MED ORDER — SUCCINYLCHOLINE CHLORIDE 20 MG/ML IJ SOLN
120.0000 mg | Freq: Once | INTRAMUSCULAR | Status: AC
  Administered 2013-05-26: 120 mg via INTRAVENOUS

## 2013-05-26 MED ORDER — ALBUTEROL SULFATE (5 MG/ML) 0.5% IN NEBU
2.5000 mg | INHALATION_SOLUTION | RESPIRATORY_TRACT | Status: DC
  Administered 2013-05-26 – 2013-06-03 (×46): 2.5 mg via RESPIRATORY_TRACT
  Filled 2013-05-26 (×48): qty 0.5

## 2013-05-26 MED ORDER — DEXTROSE 5 % IV SOLN: 500.0000 mg | INTRAVENOUS | Status: DC

## 2013-05-26 MED ORDER — AZITHROMYCIN 500 MG IV SOLR
500.0000 mg | INTRAVENOUS | Status: DC
  Administered 2013-05-26 – 2013-05-30 (×5): 500 mg via INTRAVENOUS
  Filled 2013-05-26 (×5): qty 500

## 2013-05-26 MED ORDER — INSULIN ASPART 100 UNIT/ML ~~LOC~~ SOLN: 0.0000 [IU] | Freq: Three times a day (TID) | SUBCUTANEOUS | Status: DC

## 2013-05-26 MED ORDER — IPRATROPIUM BROMIDE 0.02 % IN SOLN
0.5000 mg | Freq: Once | RESPIRATORY_TRACT | Status: AC
  Administered 2013-05-26: 0.5 mg via RESPIRATORY_TRACT
  Filled 2013-05-26: qty 2.5

## 2013-05-26 MED ORDER — ROCURONIUM BROMIDE 50 MG/5ML IV SOLN
INTRAVENOUS | Status: AC
  Filled 2013-05-26: qty 2

## 2013-05-26 MED ORDER — INSULIN ASPART 100 UNIT/ML ~~LOC~~ SOLN
0.0000 [IU] | SUBCUTANEOUS | Status: DC
  Administered 2013-05-27: 2 [IU] via SUBCUTANEOUS
  Administered 2013-05-27: 3 [IU] via SUBCUTANEOUS
  Administered 2013-05-27 (×3): 2 [IU] via SUBCUTANEOUS
  Administered 2013-05-27: 8 [IU] via SUBCUTANEOUS
  Administered 2013-05-28 (×4): 2 [IU] via SUBCUTANEOUS
  Administered 2013-05-28: 3 [IU] via SUBCUTANEOUS
  Administered 2013-05-29: 2 [IU] via SUBCUTANEOUS
  Administered 2013-05-29 (×3): 3 [IU] via SUBCUTANEOUS
  Administered 2013-05-30 (×2): 2 [IU] via SUBCUTANEOUS
  Administered 2013-05-30: 3 [IU] via SUBCUTANEOUS
  Administered 2013-05-30: 2 [IU] via SUBCUTANEOUS
  Administered 2013-05-30: 5 [IU] via SUBCUTANEOUS
  Administered 2013-05-31: 3 [IU] via SUBCUTANEOUS
  Administered 2013-05-31: 2 [IU] via SUBCUTANEOUS
  Administered 2013-05-31: 5 [IU] via SUBCUTANEOUS
  Administered 2013-05-31: 3 [IU] via SUBCUTANEOUS
  Administered 2013-06-01: 5 [IU] via SUBCUTANEOUS
  Administered 2013-06-01: 2 [IU] via SUBCUTANEOUS
  Administered 2013-06-01 – 2013-06-02 (×2): 3 [IU] via SUBCUTANEOUS
  Administered 2013-06-02 (×2): 5 [IU] via SUBCUTANEOUS
  Administered 2013-06-02 (×3): 3 [IU] via SUBCUTANEOUS
  Administered 2013-06-02: 5 [IU] via SUBCUTANEOUS

## 2013-05-26 MED ORDER — INSULIN ASPART 100 UNIT/ML ~~LOC~~ SOLN: 0.0000 [IU] | SUBCUTANEOUS | Status: DC

## 2013-05-26 MED ORDER — ALBUTEROL SULFATE (5 MG/ML) 0.5% IN NEBU
5.0000 mg | INHALATION_SOLUTION | Freq: Once | RESPIRATORY_TRACT | Status: AC
  Administered 2013-05-26: 5 mg via RESPIRATORY_TRACT
  Filled 2013-05-26: qty 1

## 2013-05-26 MED ORDER — FENTANYL BOLUS VIA INFUSION
25.0000 ug | Freq: Four times a day (QID) | INTRAVENOUS | Status: DC | PRN
  Filled 2013-05-26: qty 100

## 2013-05-26 MED ORDER — SUCCINYLCHOLINE CHLORIDE 20 MG/ML IJ SOLN
INTRAMUSCULAR | Status: AC
  Filled 2013-05-26: qty 1

## 2013-05-26 MED ORDER — ETOMIDATE 2 MG/ML IV SOLN
20.0000 mg | Freq: Once | INTRAVENOUS | Status: AC
  Administered 2013-05-26: 20 mg via INTRAVENOUS

## 2013-05-26 MED ORDER — SODIUM CHLORIDE 0.9 % IV SOLN: 250.0000 mL | INTRAVENOUS | Status: DC | PRN

## 2013-05-26 MED ORDER — IPRATROPIUM BROMIDE 0.02 % IN SOLN
0.5000 mg | RESPIRATORY_TRACT | Status: DC
  Administered 2013-05-26 – 2013-06-03 (×46): 0.5 mg via RESPIRATORY_TRACT
  Filled 2013-05-26 (×47): qty 2.5

## 2013-05-26 MED ORDER — METHYLPREDNISOLONE SODIUM SUCC 125 MG IJ SOLR
125.0000 mg | Freq: Four times a day (QID) | INTRAMUSCULAR | Status: DC
  Administered 2013-05-27 – 2013-06-01 (×22): 125 mg via INTRAVENOUS
  Filled 2013-05-26 (×22): qty 2

## 2013-05-26 MED ORDER — METHYLPREDNISOLONE SODIUM SUCC 125 MG IJ SOLR
125.0000 mg | Freq: Once | INTRAMUSCULAR | Status: AC
  Administered 2013-05-26: 125 mg via INTRAVENOUS
  Filled 2013-05-26: qty 2

## 2013-05-26 MED ORDER — CHLORHEXIDINE GLUCONATE 0.12 % MT SOLN
15.0000 mL | Freq: Two times a day (BID) | OROMUCOSAL | Status: DC
  Administered 2013-05-26 – 2013-05-29 (×7): 15 mL via OROMUCOSAL
  Filled 2013-05-26 (×7): qty 15

## 2013-05-26 MED ORDER — INSULIN ASPART 100 UNIT/ML ~~LOC~~ SOLN: 0.0000 [IU] | Freq: Four times a day (QID) | SUBCUTANEOUS | Status: DC

## 2013-05-26 MED ORDER — BIOTENE DRY MOUTH MT LIQD
15.0000 mL | Freq: Four times a day (QID) | OROMUCOSAL | Status: DC
  Administered 2013-05-27 – 2013-05-30 (×14): 15 mL via OROMUCOSAL

## 2013-05-26 MED ORDER — FENTANYL CITRATE 0.05 MG/ML IJ SOLN
INTRAMUSCULAR | Status: AC
  Filled 2013-05-26: qty 50

## 2013-05-26 MED ORDER — DEXTROSE 5 % IV SOLN
1.0000 g | INTRAVENOUS | Status: DC
  Administered 2013-05-26 – 2013-06-01 (×7): 1 g via INTRAVENOUS
  Filled 2013-05-26 (×9): qty 10

## 2013-05-26 NOTE — ED Notes (Signed)
CRITICAL VALUE ALERT  Critical value received:  CO2 40  Date of notification:  05/26/13  Time of notification:  2000  Critical value read back:yes  Nurse who received alert:  Baltazar Apo, RN  MD notified (1st page):  Dr. Lynelle Doctor  Time of first page:  2000

## 2013-05-26 NOTE — ED Notes (Signed)
Pt reports being sob since earlier this am. ems has been out to home x1 already and now arrived by ems from home, has been given neb treatments x3 and solumedrol 125mg 

## 2013-05-26 NOTE — H&P (Signed)
[facility] History and Physical  Jeff Ray FAO:[id number] DOB: 1956/09/08 DOA: 05/26/2013  Referring physician: Dr. Lynelle Doctor, ER. PCP: Alice Reichert, MD  Specialists: Dr. Juanetta Gosling, pulmonology.  Chief Complaint: Dyspnea/respiratory failure.  HPI: Jeff Ray is a 57 y.o. male who presents to the emergency room and was promptly intubated because of extreme lethargy and respiratory distress. He has a history of being intubated previously. He has what appears to be severe COPD. He was unresponsive and apparently became short of breath earlier this morning. There is no further history obtained from the patient. From the medical records, he has a history of hypertension, diabetes, asthma and COPD. Apparently, he is a smoker.   Review of Systems:.  Patient is intubated and mechanically ventilated and therefore cannot provide review of systems.  Past Medical History  Diagnosis Date  . COPD (chronic obstructive pulmonary disease)   . Asthma   . Hypertension   . Anxiety   . Diabetes mellitus    Past Surgical History  Procedure Laterality Date  . Back surgery     Social History:  reports that he has been smoking Cigarettes.  He has a 10 pack-year smoking history. He does not have any smokeless tobacco history on file. He reports that he does not drink alcohol or use illicit drugs.   No Known Allergies  No family history on file. noncontributory  Prior to Admission medications   Medication Sig Start Date End Date Taking? Authorizing Provider  albuterol (PROAIR HFA) 108 (90 BASE) MCG/ACT inhaler Inhale 2 puffs into the lungs every 6 (six) hours as needed. Shortness of Breath    Historical Provider, MD  ALPRAZolam (XANAX) 0.25 MG tablet Take 0.25 mg by mouth 3 (three) times daily as needed for anxiety.    Historical Provider, MD  fexofenadine-pseudoephedrine (ALLEGRA-D 24) 180-240 MG per 24 hr tablet Take 1 tablet by mouth daily.    Historical Provider, MD  guaiFENesin (MUCINEX)  600 MG 12 hr tablet Take 1,200 mg by mouth daily. congestion    Historical Provider, MD  ibuprofen (ADVIL,MOTRIN) 800 MG tablet Take 800 mg by mouth daily as needed. For pain    Historical Provider, MD  ipratropium-albuterol (DUONEB) 0.5-2.5 (3) MG/3ML SOLN Take 3 mLs by nebulization every 4 (four) hours as needed. For shortness of breath    Historical Provider, MD  levofloxacin (LEVAQUIN) 750 MG tablet Take 1 tablet (750 mg total) by mouth daily. 12/21/12   Angus Edilia Bo, MD  metFORMIN (GLUCOPHAGE) 1000 MG tablet Take 1 tablet (1,000 mg total) by mouth 2 (two) times daily. 06/08/11   Angus Edilia Bo, MD  montelukast (SINGULAIR) 10 MG tablet Take 10 mg by mouth at bedtime.      Historical Provider, MD  predniSONE (DELTASONE) 10 MG tablet Take 20-30 mg by mouth daily.    Historical Provider, MD  rosuvastatin (CRESTOR) 10 MG tablet Take 10 mg by mouth daily.    Historical Provider, MD   Physical Exam: Filed Vitals:   05/26/13 1938  BP: 142/81  Pulse:   Temp:   Resp: 18     General:  Patient is intubated and mechanically ventilated.  Eyes: No pallor. No jaundice.  ENT: Unremarkable.  Neck: No lymphadenopathy.  Cardiovascular: Heart sounds are sinus rhythm without any murmurs or added sounds.  Respiratory: Prolonged expiration and wheezing throughout lung fields. There are no crackles or evidence of bronchial breathing.  Abdomen: Soft. No masses. No obvious tenderness but the patient is sedated.  Skin: No  rash.  Musculoskeletal: No acute joint problems.  Psychiatric: Not examined.  Neurologic: Unable to examine secondary to sedation for ventilation.     CBC:  Recent Labs Lab 05/26/13 1818  WBC 6.3  NEUTROABS 5.3  HGB 13.5  HCT 44.8  MCV 97.6  PLT 288     BNP (last 3 results)  Recent Labs  05/26/13 1818  PROBNP 235.9*   CBG:  Recent Labs Lab 05/26/13 1817  GLUCAP 191*    Radiological Exams on Admission: Dg Chest Port 1 View  05/26/2013   *RADIOLOGY  REPORT*  Clinical Data: Shortness of breath.  Intubation.  PORTABLE CHEST - 1 VIEW  Comparison: Portable chest x-rays 12/15/2012, 01/13/2012, 01/12/2012.  Two-view chest x-ray 01/10/2012.  Findings: Endotracheal tube tip in satisfactory position projecting approximately 4 cm above the carina.  Nasogastric tube courses below the diaphragm and is looped in the stomach.  Cardiac silhouette normal in size, unchanged.  Thoracic aorta mildly atherosclerotic, unchanged.  Hilar and mediastinal contours otherwise unremarkable.  Emphysematous changes in the upper lobes, right greater than left, unchanged.  Stable calcified granulomata in the right lung.  No new abnormalities in either lung.  IMPRESSION:  1.  Endotracheal tube tip in satisfactory position projecting approximate 4 cm above the carina. 2.  Nasogastric tube courses below the diaphragm and is looped in the stomach. 3.  Stable COPD/emphysema.  Old granulomatous disease.  No acute cardiopulmonary disease.   Original Report Authenticated By: Hulan Saas, M.D.    EKG: Independently reviewed. Normal sinus rhythm, right bundle branch block, no acute ST-T wave changes.  Assessment/Plan   1. Type II acute respiratory failure on chronic respiratory failure requiring intubation and mechanical ventilation. 2. COPD exacerbation. 3. Hypertension. 4. Type 2 diabetes mellitus. 5. Ongoing tobacco abuse.  Plan: 1. Admit to intensive care unit. 2. Continue mechanical ventilation. 3. Pulmonary consultation. 4. Intravenous steroids. 5. Intravenous antibiotics. 6. Serial cardiac enzymes to make sure he has not had a cardiac event in view of his risk factors.  Code Status: Full code.  Family Communication: No family members present at the bedside.   Disposition Plan: Home when medically stable.   Time spent: 60 minutes.  Lozinski Singer [facility] Pager [phone].  If 7PM-7AM, please contact night-coverage [email or web address] Password  [facility] 05/26/2013, 7:55 PM

## 2013-05-26 NOTE — ED Provider Notes (Signed)
CSN: [id number]     Arrival date & time 05/26/13  1806 History   First MD Initiated Contact with Patient 05/26/13 1808     Chief Complaint  Patient presents with  . Shortness of Breath   level V caveat: The daily the patient's medical condition, unresponsiveness HPI Comments: The patient has a history of COPD. He has had to be intubated previously admitted to the hospital. Per EMS reports the patient began having shortness of breath earlier this morning.   EMS went out to the home ready once and gave him breathing treatments. They were called out to the house again this evening because of worsening symptoms.  Patient was given albuterol Atrovent treatments as well as Solu-Medrol. Patient is unable to answer my questions in the emergency department. He'll open his eyes briefly when I apply painful stimuli or yell his name loudly. Patient is unable to speak or provide additional history  Patient is a 57 y.o. male presenting with shortness of breath. The history is provided by the patient.  Shortness of Breath Severity:  Severe Onset quality:  Unable to specify Progression:  Worsening Chronicity:  Recurrent   Past Medical History  Diagnosis Date  . COPD (chronic obstructive pulmonary disease)   . Asthma   . Hypertension   . Anxiety   . Diabetes mellitus    Past Surgical History  Procedure Laterality Date  . Back surgery     No family history on file. History  Substance Use Topics  . Smoking status: Current Every Day Smoker -- 1.00 packs/day for 10 years    Types: Cigarettes  . Smokeless tobacco: Not on file  . Alcohol Use: No    Review of Systems  Unable to perform ROS: Acuity of condition  Respiratory: Positive for shortness of breath.     Allergies  Review of patient's allergies indicates no known allergies.  Home Medications   Current Outpatient Rx  Name  Route  Sig  Dispense  Refill  . albuterol (PROAIR HFA) 108 (90 BASE) MCG/ACT inhaler   Inhalation   Inhale 2  puffs into the lungs every 6 (six) hours as needed. Shortness of Breath         . ALPRAZolam (XANAX) 0.25 MG tablet   Oral   Take 0.25 mg by mouth 3 (three) times daily as needed for anxiety.         . fexofenadine-pseudoephedrine (ALLEGRA-D 24) 180-240 MG per 24 hr tablet   Oral   Take 1 tablet by mouth daily.         [name] guaiFENesin (MUCINEX) 600 MG 12 hr tablet   Oral   Take 1,200 mg by mouth daily. congestion         . ibuprofen (ADVIL,MOTRIN) 800 MG tablet   Oral   Take 800 mg by mouth daily as needed. For pain         . ipratropium-albuterol (DUONEB) 0.5-2.5 (3) MG/3ML SOLN   Nebulization   Take 3 mLs by nebulization every 4 (four) hours as needed. For shortness of breath         . levofloxacin (LEVAQUIN) 750 MG tablet   Oral   Take 1 tablet (750 mg total) by mouth daily.   7 tablet   0   . metFORMIN (GLUCOPHAGE) 1000 MG tablet   Oral   Take 1 tablet (1,000 mg total) by mouth 2 (two) times daily.   60 tablet   5   . montelukast (SINGULAIR) 10 MG tablet  Oral   Take 10 mg by mouth at bedtime.           . predniSONE (DELTASONE) 10 MG tablet   Oral   Take 20-30 mg by mouth daily.         . rosuvastatin (CRESTOR) 10 MG tablet   Oral   Take 10 mg by mouth daily.          BP 127/92  Pulse 93  Temp(Src) 98.6 F (37 C) (Rectal)  Resp 15  Ht 6' (1.829 m)  Wt 180 lb (81.647 kg)  BMI 24.41 kg/m2  SpO2 99% Physical Exam  Nursing note and vitals reviewed. Constitutional: He appears well-developed and well-nourished. He appears distressed.  HENT:  Head: Normocephalic and atraumatic.  Right Ear: External ear normal.  Left Ear: External ear normal.  Mouth/Throat: No oropharyngeal exudate.  Poor dentition  Eyes: Conjunctivae are normal. Right eye exhibits no discharge. Left eye exhibits no discharge. No scleral icterus.  Neck: Neck supple. No tracheal deviation present.  Cardiovascular: Regular rhythm and intact distal pulses.  Tachycardia  present.   Pulmonary/Chest: Accessory muscle usage present. No stridor. Tachypnea noted. He is in respiratory distress. He has decreased breath sounds. He has wheezes. He has no rales.  Abdominal: Soft. Bowel sounds are normal. He exhibits no distension. There is no tenderness. There is no rebound and no guarding.  Musculoskeletal: He exhibits no edema and no tenderness.  Neurological: He is alert. He has normal strength. No sensory deficit. Cranial nerve deficit:  no gross defecits noted. He exhibits normal muscle tone. He displays no seizure activity. Coordination normal.  Skin: Skin is warm. No rash noted. He is diaphoretic.  Psychiatric: He has a normal mood and affect.    ED Course  EKG Sinus tachycardia rate 112 Low voltage QRS Right bundle-branch block Normal ST T waves Significant change when compared to prior EKG dated 4T March 2014 INTUBATION Date/Time: 05/26/2013 6:43 PM Performed by: Linwood Dibbles R Authorized by: Linwood Dibbles R Consent: The procedure was performed in an emergent situation. Indications: respiratory distress Intubation method: video-assisted Patient status: paralyzed (RSI) Preoxygenation: nonrebreather mask and BVM Sedatives: etomidate Paralytic: succinylcholine Tube size: 8.0 mm Tube type: cuffed Number of attempts: 1 Cricoid pressure: yes Cords visualized: yes Post-procedure assessment: chest rise and ETCO2 monitor Breath sounds: equal Cuff inflated: yes Tube secured with: ETT holder Chest x-ray interpreted by radiologist. Patient tolerance: Patient tolerated the procedure well with no immediate complications.  CRITICAL CARE Performed by: Linwood Dibbles R Authorized by: Linwood Dibbles R Total critical care time: 40 minutes Critical care time was exclusive of separately billable procedures and treating other patients. Critical care was necessary to treat or prevent imminent or life-threatening deterioration of the following conditions: respiratory  failure. Critical care was time spent personally by me on the following activities: discussions with consultants, development of treatment plan with patient or surrogate, evaluation of patient's response to treatment, examination of patient, obtaining history from patient or surrogate, ordering and performing treatments and interventions, ordering and review of laboratory studies, ordering and review of radiographic studies, pulse oximetry, re-evaluation of patient's condition and review of old charts.   (including critical care time) 1927  Pt sedated on the vent.  Heme stable.  Labs Reviewed  PRO B NATRIURETIC PEPTIDE - Abnormal; Notable for the following:    Pro B Natriuretic peptide (BNP) 235.9 (*)    All other components within normal limits  BLOOD GAS, ARTERIAL - Abnormal; Notable for the following:  pH, Arterial 7.198 (*)    pCO2 arterial 110.0 (*)    pO2, Arterial 520.0 (*)    Bicarbonate 41.1 (*)    Acid-Base Excess 12.9 (*)    All other components within normal limits  CBC WITH DIFFERENTIAL - Abnormal; Notable for the following:    Neutrophils Relative % 84 (*)    All other components within normal limits  GLUCOSE, CAPILLARY - Abnormal; Notable for the following:    Glucose-Capillary 191 (*)    All other components within normal limits  COMPREHENSIVE METABOLIC PANEL - Abnormal; Notable for the following:    Potassium 5.2 (*)    Chloride 93 (*)    CO2 40 (*)    Glucose, Bld 217 (*)    Albumin 3.2 (*)    Total Bilirubin <0.1 (*)    All other components within normal limits  TROPONIN I  CBC  CBC  BLOOD GAS, ARTERIAL  COMPREHENSIVE METABOLIC PANEL  TROPONIN I  TROPONIN I  TROPONIN I  PRO B NATRIURETIC PEPTIDE   Dg Chest Port 1 View  05/26/2013   *RADIOLOGY REPORT*  Clinical Data: Shortness of breath.  Intubation.  PORTABLE CHEST - 1 VIEW  Comparison: Portable chest x-rays 12/15/2012, 01/13/2012, 01/12/2012.  Two-view chest x-ray 01/10/2012.  Findings: Endotracheal tube  tip in satisfactory position projecting approximately 4 cm above the carina.  Nasogastric tube courses below the diaphragm and is looped in the stomach.  Cardiac silhouette normal in size, unchanged.  Thoracic aorta mildly atherosclerotic, unchanged.  Hilar and mediastinal contours otherwise unremarkable.  Emphysematous changes in the upper lobes, right greater than left, unchanged.  Stable calcified granulomata in the right lung.  No new abnormalities in either lung.  IMPRESSION:  1.  Endotracheal tube tip in satisfactory position projecting approximate 4 cm above the carina. 2.  Nasogastric tube courses below the diaphragm and is looped in the stomach. 3.  Stable COPD/emphysema.  Old granulomatous disease.  No acute cardiopulmonary disease.   Original Report Authenticated By: Hulan Saas, M.D.   1. Acute respiratory failure   2. COPD (chronic obstructive pulmonary disease)   3. COPD exacerbation   4. DM (diabetes mellitus)   5. HTN (hypertension)     MDM  Patient presented with recurrent acute respiratory failure associated with severe COPD. He required emergent intubation due to his somnolence. Preliminary ABG is consistent with significant hypercapnia. Plan will be to admit the patient to the hospital for continued treatment.  Celene Kras, MD 05/26/13 2016

## 2013-05-27 ENCOUNTER — Inpatient Hospital Stay (HOSPITAL_COMMUNITY): Payer: Medicare HMO

## 2013-05-27 LAB — BLOOD GAS, ARTERIAL
Acid-Base Excess: 11.6 mmol/L — ABNORMAL HIGH (ref 0.0–2.0)
MECHVT: 550 mL
Patient temperature: 37
RATE: 16 resp/min
TCO2: 32.9 mmol/L (ref 0–100)
pCO2 arterial: 56.2 mmHg — ABNORMAL HIGH (ref 35.0–45.0)
pH, Arterial: 7.428 (ref 7.350–7.450)

## 2013-05-27 LAB — CBC
HCT: 39.2 % (ref 39.0–52.0)
Hemoglobin: 12 g/dL — ABNORMAL LOW (ref 13.0–17.0)
MCH: 29.3 pg (ref 26.0–34.0)
MCV: 95.6 fL (ref 78.0–100.0)
Platelets: 237 10*3/uL (ref 150–400)
RBC: 4.1 MIL/uL — ABNORMAL LOW (ref 4.22–5.81)
WBC: 4.5 10*3/uL (ref 4.0–10.5)

## 2013-05-27 LAB — PRO B NATRIURETIC PEPTIDE: Pro B Natriuretic peptide (BNP): 427.8 pg/mL — ABNORMAL HIGH (ref 0–125)

## 2013-05-27 LAB — GLUCOSE, CAPILLARY
Glucose-Capillary: 130 mg/dL — ABNORMAL HIGH (ref 70–99)
Glucose-Capillary: 135 mg/dL — ABNORMAL HIGH (ref 70–99)
Glucose-Capillary: 138 mg/dL — ABNORMAL HIGH (ref 70–99)
Glucose-Capillary: 141 mg/dL — ABNORMAL HIGH (ref 70–99)
Glucose-Capillary: 148 mg/dL — ABNORMAL HIGH (ref 70–99)
Glucose-Capillary: 188 mg/dL — ABNORMAL HIGH (ref 70–99)

## 2013-05-27 LAB — TROPONIN I: Troponin I: <0.3 ng/mL (ref <0.30)

## 2013-05-27 LAB — COMPREHENSIVE METABOLIC PANEL
CO2: 37 mEq/L — ABNORMAL HIGH (ref 19–32)
Calcium: 9.8 mg/dL (ref 8.4–10.5)
Creatinine, Ser: 1.46 mg/dL — ABNORMAL HIGH (ref 0.50–1.35)
GFR calc Af Amer: 59 mL/min — ABNORMAL LOW (ref >90)
GFR calc non Af Amer: 51 mL/min — ABNORMAL LOW (ref >90)
Glucose, Bld: 180 mg/dL — ABNORMAL HIGH (ref 70–99)
Total Protein: 6.6 g/dL (ref 6.0–8.3)

## 2013-05-27 LAB — MRSA PCR SCREENING: MRSA by PCR: NEGATIVE

## 2013-05-27 MED ORDER — SODIUM CHLORIDE 0.9 % IV BOLUS (SEPSIS)
1000.0000 mL | Freq: Once | INTRAVENOUS | Status: AC
  Administered 2013-05-27: 1000 mL via INTRAVENOUS

## 2013-05-27 NOTE — Consult Note (Signed)
Consult requested by: Dr. Renard Matter Consult requested for respiratory failure:  HPI: This is a 57 year old who has a long known history of severe COPD in who was in his usual state of fair health at home when he developed increasing shortness of breath cough and was found to be lethargic and in respiratory distress. Little history is available because there is no family present and he is intubated  Past Medical History  Diagnosis Date  . COPD (chronic obstructive pulmonary disease)   . Asthma   . Hypertension   . Anxiety   . Diabetes mellitus      No family history on file.   History   Social History  . Marital Status: Married    Spouse Name: N/A    Number of Children: N/A  . Years of Education: N/A   Social History Main Topics  . Smoking status: Current Every Day Smoker -- 1.00 packs/day for 10 years    Types: Cigarettes  . Smokeless tobacco: None  . Alcohol Use: No  . Drug Use: No  . Sexual Activity: Yes   Other Topics Concern  . None   Social History Narrative  . None     ROS: Unobtainable    Objective: Vital signs in last 24 hours: Temp:  [97.6 F (36.4 C)-98.8 F (37.1 C)] 98.5 F (36.9 C) (08/24 0800) Pulse Rate:  [91-125] 93 (08/24 0830) Resp:  [14-42] 15 (08/24 0830) BP: (85-167)/(63-116) 103/80 mmHg (08/24 0830) SpO2:  [90 %-100 %] 97 % (08/24 0830) FiO2 (%):  [35 %-100 %] 35 % (08/24 0823) Weight:  [77.4 kg (170 lb 10.2 oz)-81.647 kg (180 lb)] 78.4 kg (172 lb 13.5 oz) (08/24 0534) Weight change:     Intake/Output from previous day: 08/23 0701 - 08/24 0700 In: 1158 [I.V.:908; IV Piggyback:250] Out: 250 [Urine:150; Emesis/NG output:100]  PHYSICAL EXAM He will open his eyes. He is intubated and sedated. Mucous membranes are moist. His neck is supple. His chest shows some rhonchi and end expiratory wheezes bilaterally. His heart is regular without gallop. His abdomen is soft no masses are felt extreme he showed no edema. He moves all 4  extremities.  Lab Results: Basic Metabolic Panel:  Recent Labs  16/10/96 1818 05/27/13 0543  NA 136 135  K 5.2* 4.2  CL 93* 94*  CO2 40* 37*  GLUCOSE 217* 180*  BUN 10 20  CREATININE 0.71 1.46*  CALCIUM 9.8 9.8   Liver Function Tests:  Recent Labs  05/26/13 1818 05/27/13 0543  AST 16 14  ALT 15 15  ALKPHOS 89 77  BILITOT <0.1* 0.2*  PROT 7.3 6.6  ALBUMIN 3.2* 3.0*   No results found for this basename: LIPASE, AMYLASE,  in the last 72 hours No results found for this basename: AMMONIA,  in the last 72 hours CBC:  Recent Labs  05/26/13 1818 05/27/13 0543  WBC 6.3 4.5  NEUTROABS 5.3  --   HGB 13.5 12.0*  HCT 44.8 39.2  MCV 97.6 95.6  PLT 288 237   Cardiac Enzymes:  Recent Labs  05/26/13 1818 05/27/13 0026 05/27/13 0544  TROPONINI <0.30 <0.30 <0.30   BNP:  Recent Labs  05/26/13 1818 05/27/13 0026  PROBNP 235.9* 427.8*   D-Dimer: No results found for this basename: DDIMER,  in the last 72 hours CBG:  Recent Labs  05/26/13 1817 05/26/13 2213 05/27/13 0038 05/27/13 0433 05/27/13 0747  GLUCAP 191* 189* 241* 188* 138*   Hemoglobin A1C: No results found for this basename:  HGBA1C,  in the last 72 hours Fasting Lipid Panel: No results found for this basename: CHOL, HDL, LDLCALC, TRIG, CHOLHDL, LDLDIRECT,  in the last 72 hours Thyroid Function Tests: No results found for this basename: TSH, T4TOTAL, FREET4, T3FREE, THYROIDAB,  in the last 72 hours Anemia Panel: No results found for this basename: VITAMINB12, FOLATE, FERRITIN, TIBC, IRON, RETICCTPCT,  in the last 72 hours Coagulation: No results found for this basename: LABPROT, INR,  in the last 72 hours Urine Drug Screen: Drugs of Abuse  No results found for this basename: labopia, cocainscrnur, labbenz, amphetmu, thcu, labbarb    Alcohol Level: No results found for this basename: ETH,  in the last 72 hours Urinalysis: No results found for this basename: COLORURINE, APPERANCEUR, LABSPEC,  PHURINE, GLUCOSEU, HGBUR, BILIRUBINUR, KETONESUR, PROTEINUR, UROBILINOGEN, NITRITE, LEUKOCYTESUR,  in the last 72 hours Misc. Labs:   ABGS:  Recent Labs  05/27/13 0500  PHART 7.428  PO2ART 97.4  TCO2 32.9  HCO3 36.5*     MICROBIOLOGY: Recent Results (from the past 240 hour(s))  MRSA PCR SCREENING     Status: None   Collection Time    05/27/13 12:35 AM      Result Value Range Status   MRSA by PCR NEGATIVE  NEGATIVE Final   Comment:            The GeneXpert MRSA Assay (FDA     approved for NASAL specimens     only), is one component of a     comprehensive MRSA colonization     surveillance program. It is not     intended to diagnose MRSA     infection nor to guide or     monitor treatment for     MRSA infections.    Studies/Results: Dg Chest Port 1 View  05/27/2013   *RADIOLOGY REPORT*  Clinical Data: Respiratory failure, COPD, on ventilator  PORTABLE CHEST - 1 VIEW  Comparison: Portable exam 0820 hours compared to 05/26/2013  Findings: Endotracheal tube tip 6.1 cm above carina. Nasogastric tube again noted extending into abdomen. Normal heart size, mediastinal contours, and pulmonary vascularity. Changes of COPD again noted. Minimal persistent right basilar atelectasis. Remaining lungs clear. No pleural effusion or pneumothorax.  IMPRESSION: COPD changes with minimal right basilar atelectasis. No interval change.   Original Report Authenticated By: Ulyses Southward, M.D.   Dg Chest Port 1 View  05/26/2013   *RADIOLOGY REPORT*  Clinical Data: Shortness of breath.  Intubation.  PORTABLE CHEST - 1 VIEW  Comparison: Portable chest x-rays 12/15/2012, 01/13/2012, 01/12/2012.  Two-view chest x-ray 01/10/2012.  Findings: Endotracheal tube tip in satisfactory position projecting approximately 4 cm above the carina.  Nasogastric tube courses below the diaphragm and is looped in the stomach.  Cardiac silhouette normal in size, unchanged.  Thoracic aorta mildly atherosclerotic, unchanged.  Hilar  and mediastinal contours otherwise unremarkable.  Emphysematous changes in the upper lobes, right greater than left, unchanged.  Stable calcified granulomata in the right lung.  No new abnormalities in either lung.  IMPRESSION:  1.  Endotracheal tube tip in satisfactory position projecting approximate 4 cm above the carina. 2.  Nasogastric tube courses below the diaphragm and is looped in the stomach. 3.  Stable COPD/emphysema.  Old granulomatous disease.  No acute cardiopulmonary disease.   Original Report Authenticated By: Hulan Saas, M.D.    Medications:  Scheduled: . albuterol  2.5 mg Nebulization Q4H  . antiseptic oral rinse  15 mL Mouth Rinse QID  . azithromycin  500 mg Intravenous Q24H  . cefTRIAXone (ROCEPHIN)  IV  1 g Intravenous Q24H  . chlorhexidine  15 mL Mouth/Throat BID  . heparin  5,000 Units Subcutaneous Q8H  . insulin aspart  0-15 Units Subcutaneous Q4H  . ipratropium  0.5 mg Nebulization Q4H  . methylPREDNISolone (SOLU-MEDROL) injection  125 mg Intravenous Q6H  . pantoprazole (PROTONIX) IV  40 mg Intravenous QHS   Continuous: . sodium chloride 100 mL/hr at 05/27/13 0934  . fentaNYL infusion INTRAVENOUS 50 mcg/hr (05/27/13 0932)  . propofol 10 mcg/kg/min (05/27/13 0932)   AOZ:HYQMVH chloride, fentaNYL  Assesment: He has acute on chronic respiratory failure. He does not show pneumonia on chest x-ray. In the past when he's had these episodes he gets very sick very quickly. When I saw him in the office last he said he was not smoking Active Problems:   Respiratory failure, acute and chronic   COPD exacerbation   HTN (hypertension)   DM (diabetes mellitus)   Tobacco abuse    Plan: Continue current treatments. He did not do well with weaning trial this morning so I think he'll stay on the ventilator at least today and attempt to get him off tomorrow    LOS: 1 day   Trecia Maring L 05/27/2013, 9:32 AM

## 2013-05-27 NOTE — Progress Notes (Signed)
Paged Dr Felecia Shelling who is on call for Dr. Renard Matter.  Voiced concerns about low urine output. Pt received 1 liters bolus this am about 1000. Pt has only had 50cc of dark amber urine out. Foley patent. Bladder scan showed on 20-30 cc's of fluid.  Order received for another 1 liter NS bolus, to increase fluids to 125cc/hr and to order a BNP in the am.  Also at this time spoke to [facility] Martinez RN and made them aware of concerns.  Will continue to monitor.

## 2013-05-27 NOTE — Progress Notes (Signed)
NAME:  GLENNON, KOPKO                 ACCOUNT NO.:  [id number]  MEDICAL RECORD NO.:  [id number]  LOCATION:  IC11                          FACILITY:  [facility]  PHYSICIAN:  Theodis Kinsel G. Renard Matter, MD   DATE OF BIRTH:  [birth year]  DATE OF PROCEDURE: DATE OF DISCHARGE:                                PROGRESS NOTE   SUBJECTIVE:  This patient remains on respirator.  He presented to the emergency room in respiratory distress, was intubated.  He does have a history of hypertension, diabetes, asthma, and COPD.  The patient's blood gases on admission showed a pH of 7.198, pCO2 110, and pO2 520. Currently, pCO2 of 56.2, pO2 297.4.  The patient appears to be more stable.  A chest x-ray showed stable COPD and emphysema, no acute cardiopulmonary disease and old granulomatous disease.  OBJECTIVE:  VITAL SIGNS:  Blood pressure 103/80, respiration 15, pulse 93, temp 98.5. HEENT:  Eyes, PERRLA.  TM negative.  Oropharynx benign. NECK:  Supple.  No JVD or thyroid abnormalities. HEART:  Regular rhythm.  No murmurs. LUNGS:  Occasional rhonchus heard over lower lung fields. ABDOMEN:  Soft.  No palpable organs or masses. SKIN:  Warm and dry.  ASSESSMENT:  The patient was admitted on acute on chronic respiratory failure requiring intubation.  He does have chronic obstructive pulmonary disease, hypertension, and type 2 diabetes.  PLAN:  To continue current regimen.  Pulmonary consultation.  The patient is on IV steroids and antibiotics.  IV Rocephin 1 g every 24 hours.  Zithromax 500 mg every 24 hours.  Solu-Medrol 125 mg every 6 hours.  Proventil nebulization treatments every 4 hours.  He is to be seen by Dr. Juanetta Gosling from Pulmonology today.     Shalon Salado G. Renard Matter, MD     AGM/MEDQ  D:  05/27/2013  T:  05/27/2013  Job:  [id number]

## 2013-05-28 ENCOUNTER — Inpatient Hospital Stay (HOSPITAL_COMMUNITY): Payer: Medicare HMO

## 2013-05-28 LAB — BLOOD GAS, ARTERIAL
Acid-Base Excess: 8.6 mmol/L — ABNORMAL HIGH (ref 0.0–2.0)
Bicarbonate: 33.4 mEq/L — ABNORMAL HIGH (ref 20.0–24.0)
FIO2: 0.35 %
TCO2: 30.8 mmol/L (ref 0–100)
pCO2 arterial: 53.7 mmHg — ABNORMAL HIGH (ref 35.0–45.0)
pH, Arterial: 7.41 (ref 7.350–7.450)
pO2, Arterial: 80 mmHg (ref 80.0–100.0)

## 2013-05-28 LAB — CBC WITH DIFFERENTIAL/PLATELET
Eosinophils Relative: 0 % (ref 0–5)
Hemoglobin: 11.1 g/dL — ABNORMAL LOW (ref 13.0–17.0)
Lymphocytes Relative: 9 % — ABNORMAL LOW (ref 12–46)
Lymphs Abs: 0.8 10*3/uL (ref 0.7–4.0)
MCV: 93.1 fL (ref 78.0–100.0)
Monocytes Relative: 6 % (ref 3–12)
Platelets: 221 10*3/uL (ref 150–400)
RBC: 3.78 MIL/uL — ABNORMAL LOW (ref 4.22–5.81)
WBC: 8.6 10*3/uL (ref 4.0–10.5)

## 2013-05-28 LAB — BASIC METABOLIC PANEL
CO2: 33 mEq/L — ABNORMAL HIGH (ref 19–32)
Calcium: 9.4 mg/dL (ref 8.4–10.5)
Chloride: 100 mEq/L (ref 96–112)
Potassium: 3.8 mEq/L (ref 3.5–5.1)
Sodium: 140 mEq/L (ref 135–145)

## 2013-05-28 LAB — GLUCOSE, CAPILLARY: Glucose-Capillary: 138 mg/dL — ABNORMAL HIGH (ref 70–99)

## 2013-05-28 LAB — PRO B NATRIURETIC PEPTIDE: Pro B Natriuretic peptide (BNP): 133.2 pg/mL — ABNORMAL HIGH (ref 0–125)

## 2013-05-28 MED ORDER — LORAZEPAM 2 MG/ML IJ SOLN
0.5000 mg | INTRAMUSCULAR | Status: DC | PRN
  Administered 2013-05-29 – 2013-06-01 (×6): 0.5 mg via INTRAVENOUS
  Filled 2013-05-28 (×6): qty 1

## 2013-05-28 MED ORDER — OXEPA PO LIQD
1000.0000 mL | ORAL | Status: DC
  Administered 2013-05-28: 1000 mL
  Filled 2013-05-28 (×3): qty 1000

## 2013-05-28 NOTE — Progress Notes (Signed)
Weaned Pt on Cpap/ps 5/5. The Pt became very agitated. RT will try again after lunch. Pt didn't want to move his head back and forth or nod.

## 2013-05-28 NOTE — Progress Notes (Signed)
Dr Juanetta Gosling called and made aware of chest xray findings this am for pneumonia.

## 2013-05-28 NOTE — Care Management Note (Unsigned)
    Page 1 of 1   06/01/2013     11:37:20 AM   CARE MANAGEMENT NOTE 06/01/2013  Patient:  Jeff Ray, Jeff Ray   Account Number:  [id number]  Date Initiated:  05/28/2013  Documentation initiated by:  Sharrie Rothman  Subjective/Objective Assessment:   Pt admitted from home with respiratory failure. Pt is currently intubated and on the ventilator. Pt does have a spouse that he lives with.     Action/Plan:   CM will attempt to talk with pts spouse in regards to discharge planning.   Anticipated DC Date:  06/04/2013   Anticipated DC Plan:  HOME W HOME HEALTH SERVICES      DC Planning Services  CM consult      Choice offered to / List presented to:             Status of service:  In process, will continue to follow Medicare Important Message given?   (If response is "NO", the following Medicare IM given date fields will be blank) Date Medicare IM given:   Date Additional Medicare IM given:    Discharge Disposition:    Per UR Regulation:    If discussed at Long Length of Stay Meetings, dates discussed:   05/31/2013    Comments:  06/01/13 1135 Arlyss Queen, RN BSN CM Mandy with [facility] coming today to discuss trilogy 100 with pt that Dr. Juanetta Gosling would like pt to use at discharge. tapering IV steroids. Will be here through weekend.  05/31/13 1430 Arlyss Queen, RN BSN CM Pts wife supposed to call nursing staff to notify them of where pts bipap is from and who supplies his home O2.  05/28/13 1158 Arlyss Queen, RN BSN CM

## 2013-05-28 NOTE — Progress Notes (Signed)
NAME:  Jeff Ray, Jeff Ray                 ACCOUNT NO.:  [id number]  MEDICAL RECORD NO.:  [id number]  LOCATION:  IC11                          FACILITY:  [facility]  PHYSICIAN:  Michah Minton G. Renard Matter, MD   DATE OF BIRTH:  [birth year]  DATE OF PROCEDURE: DATE OF DISCHARGE:                                PROGRESS NOTE   SUBJECTIVE:  This patient remains on ventilator.  His current blood gases are pH 7.410, pCO2 of 53.7, and pO2 of 80.0.  He does have COPD and respiratory failure.  Parameters were not such yesterday that he could be weaned, but this will be attempted again today.  OBJECTIVE:  VITAL SIGNS:  Blood pressure 118/72, respirations 18, pulse 86, temp 98.2. HEENT:  Eyes, PERRLA.  TM negative.  Oropharynx benign. NECK:  Supple.  No JVD or thyroid abnormalities. HEART:  Regular rhythm.  No murmurs. LUNGS:  Occasional rhonchus heard over lower lung field. ABDOMEN:  Soft.  No palpable organs or masses. EXTREMITIES:  Free of edema.  ASSESSMENT:  The patient is admitted with acute on chronic respiratory failure.  He does have chronic obstructive pulmonary disease, hypertension, and type 2 diabetes.  PLAN:  To continue current regimen.  The patient remained on IV steroids, antibiotics, IV Rocephin, Zithromax, and Solu-Medrol 125 mg every 6 hours.  The patient will be seen by Pulmonology.  Attempts will be made to wean him from respirator today.     Praneel Haisley G. Renard Matter, MD     AGM/MEDQ  D:  05/28/2013  T:  05/28/2013  Job:  [id number]

## 2013-05-28 NOTE — Progress Notes (Signed)
INITIAL NUTRITION ASSESSMENT  DOCUMENTATION CODES Per approved criteria  -Not Applicable   INTERVENTION:  Patient has NGT/OGT place. Oxepa is infusing @ 30 ml/hr; continuous. Tube feeding regimen currently providing 1080 kcal, 45 grams protein, and 565 ml H2O. Propofol is contributing 129 kcal lipids at current rate. Nutrition support is meeting 65% energy, 42% protein needs.   Recommend add ProStat 60 ml BID which will supply additional 400 kcal, 60 gr protein/day which will increase nutrition provision and meet 87% energy and 97% minimum protein daily.  NUTRITION DIAGNOSIS: Inadequate oral intake related to inability to eat as evidenced by NPO status.  Goal: Pt to meet >/= 90% of their estimated nutrition needs   Monitor:  Vest status, Nutrition support measures/tolerance, labs, I/O's and wt changes  Reason for Assessment: Mechanical ventilation status  57 y.o. male   ASSESSMENT: Patient Active Problem List   Diagnosis Date Noted  . Respiratory failure, acute and chronic 05/26/2013  . COPD exacerbation 05/26/2013  . HTN (hypertension) 05/26/2013  . DM (diabetes mellitus) 05/26/2013  . Tobacco abuse 05/26/2013   Patient is currently intubated on ventilator support.  MV: 9.3  L/min Temp:Temp (24hrs), Avg:98.2 F (36.8 C), Min:97.5 F (36.4 C), Max:98.7 F (37.1 C)  Propofol: 4.9 ml/hr (129 kcal/day lipids)  IVF-NS @125  ml/hr  Height: Ht Readings from Last 1 Encounters:  05/28/13 6' (1.829 m)    Weight: Wt Readings from Last 1 Encounters:  05/28/13 182 lb 15.7 oz (83 kg)    Ideal Body Weight: 178# (80.9 kg)  % Ideal Body Weight: 102%  Wt Readings from Last 10 Encounters:  05/28/13 182 lb 15.7 oz (83 kg)  12/17/12 179 lb 10.8 oz (81.5 kg)  01/16/12 164 lb 10.9 oz (74.7 kg)  08/26/11 174 lb 13.2 oz (79.3 kg)  06/07/11 179 lb 0.2 oz (81.2 kg)  04/06/11 174 lb 9.7 oz (79.2 kg)    Usual Body Weight: 175-180#  % Usual Body Weight: 102%  BMI:  Body  mass index is 24.81 kg/(m^2).normal range  Estimated Nutritional Needs: Kcal: 1847 ([employer]) Protein: 108-124 gr Fluid: >2200 ml/day  Skin: No issues noted  Diet Order: NPO  EDUCATION NEEDS: -Education not appropriate at this time   Intake/Output Summary (Last 24 hours) at 05/28/13 1027 Last data filed at 05/28/13 0900  Gross per 24 hour  Intake   3293 ml  Output    800 ml  Net   2493 ml    Last BM: PTA  Labs:   Recent Labs Lab 05/26/13 1818 05/27/13 0543 05/28/13 0448  NA 136 135 140  K 5.2* 4.2 3.8  CL 93* 94* 100  CO2 40* 37* 33*  BUN 10 20 27*  CREATININE 0.71 1.46* 1.13  CALCIUM 9.8 9.8 9.4  GLUCOSE 217* 180* 133*    CBG (last 3)   Recent Labs  05/27/13 2330 05/28/13 0344 05/28/13 0739  GLUCAP 148* 115* 146*    Scheduled Meds: . albuterol  2.5 mg Nebulization Q4H  . antiseptic oral rinse  15 mL Mouth Rinse QID  . azithromycin  500 mg Intravenous Q24H  . cefTRIAXone (ROCEPHIN)  IV  1 g Intravenous Q24H  . chlorhexidine  15 mL Mouth/Throat BID  . feeding supplement (OXEPA)  1,000 mL Per Tube Q24H  . heparin  5,000 Units Subcutaneous Q8H  . insulin aspart  0-15 Units Subcutaneous Q4H  . ipratropium  0.5 mg Nebulization Q4H  . methylPREDNISolone (SOLU-MEDROL) injection  125 mg Intravenous Q6H  . pantoprazole (  PROTONIX) IV  40 mg Intravenous QHS    Continuous Infusions: . sodium chloride 125 mL/hr at 05/28/13 0900  . fentaNYL infusion INTRAVENOUS 50 mcg/hr (05/28/13 0827)  . propofol 10 mcg/kg/min (05/28/13 1610)    Past Medical History  Diagnosis Date  . COPD (chronic obstructive pulmonary disease)   . Asthma   . Hypertension   . Anxiety   . Diabetes mellitus     Past Surgical History  Procedure Laterality Date  . Back surgery      Royann Shivers MS,RD,LDN,CSG Office: [phone] Pager: [phone]

## 2013-05-28 NOTE — Progress Notes (Signed)
Subjective: Weaning was attempted this morning and he became panicky. He is back on ventilation right now.  Objective: Vital signs in last 24 hours: Temp:  [97.5 F (36.4 C)-98.7 F (37.1 C)] 98.2 F (36.8 C) (08/25 0400) Pulse Rate:  [37-108] 86 (08/25 0615) Resp:  [10-20] 18 (08/25 0615) BP: (97-126)/(54-91) 118/72 mmHg (08/25 0615) SpO2:  [83 %-100 %] 100 % (08/25 0746) FiO2 (%):  [35 %] 35 % (08/25 0747) Weight:  [83 kg (182 lb 15.7 oz)] 83 kg (182 lb 15.7 oz) (08/25 0500) Weight change: 1.353 kg (2 lb 15.7 oz)    Intake/Output from previous day: 08/24 0701 - 08/25 0700 In: 3239.3 [I.V.:2889.3; IV Piggyback:350] Out: 800 [Urine:400; Emesis/NG output:400]  PHYSICAL EXAM General appearance: He is intubated and sedated and in no acute distress. Resp: rhonchi bilaterally and wheezes bilaterally Cardio: regular rate and rhythm, S1, S2 normal, no murmur, click, rub or gallop GI: soft, non-tender; bowel sounds normal; no masses,  no organomegaly Extremities: extremities normal, atraumatic, no cyanosis or edema  Lab Results:    Basic Metabolic Panel:  Recent Labs  16/10/96 0543 05/28/13 0448  NA 135 140  K 4.2 3.8  CL 94* 100  CO2 37* 33*  GLUCOSE 180* 133*  BUN 20 27*  CREATININE 1.46* 1.13  CALCIUM 9.8 9.4   Liver Function Tests:  Recent Labs  05/26/13 1818 05/27/13 0543  AST 16 14  ALT 15 15  ALKPHOS 89 77  BILITOT <0.1* 0.2*  PROT 7.3 6.6  ALBUMIN 3.2* 3.0*   No results found for this basename: LIPASE, AMYLASE,  in the last 72 hours No results found for this basename: AMMONIA,  in the last 72 hours CBC:  Recent Labs  05/26/13 1818 05/27/13 0543 05/28/13 0448  WBC 6.3 4.5 8.6  NEUTROABS 5.3  --  7.4  HGB 13.5 12.0* 11.1*  HCT 44.8 39.2 35.2*  MCV 97.6 95.6 93.1  PLT 288 237 221   Cardiac Enzymes:  Recent Labs  05/26/13 1818 05/27/13 0026 05/27/13 0544  TROPONINI <0.30 <0.30 <0.30   BNP:  Recent Labs  05/26/13 1818  05/27/13 0026 05/28/13 0448  PROBNP 235.9* 427.8* 133.2*   D-Dimer: No results found for this basename: DDIMER,  in the last 72 hours CBG:  Recent Labs  05/27/13 1123 05/27/13 1644 05/27/13 1947 05/27/13 2330 05/28/13 0344 05/28/13 0739  GLUCAP 135* 130* 141* 148* 115* 146*   Hemoglobin A1C: No results found for this basename: HGBA1C,  in the last 72 hours Fasting Lipid Panel: No results found for this basename: CHOL, HDL, LDLCALC, TRIG, CHOLHDL, LDLDIRECT,  in the last 72 hours Thyroid Function Tests: No results found for this basename: TSH, T4TOTAL, FREET4, T3FREE, THYROIDAB,  in the last 72 hours Anemia Panel: No results found for this basename: VITAMINB12, FOLATE, FERRITIN, TIBC, IRON, RETICCTPCT,  in the last 72 hours Coagulation: No results found for this basename: LABPROT, INR,  in the last 72 hours Urine Drug Screen: Drugs of Abuse  No results found for this basename: labopia, cocainscrnur, labbenz, amphetmu, thcu, labbarb    Alcohol Level: No results found for this basename: ETH,  in the last 72 hours Urinalysis: No results found for this basename: COLORURINE, APPERANCEUR, LABSPEC, PHURINE, GLUCOSEU, HGBUR, BILIRUBINUR, KETONESUR, PROTEINUR, UROBILINOGEN, NITRITE, LEUKOCYTESUR,  in the last 72 hours Misc. Labs:  ABGS  Recent Labs  05/28/13 0452  PHART 7.410  PO2ART 80.0  TCO2 30.8  HCO3 33.4*   CULTURES Recent Results (from the past 240  hour(s))  MRSA PCR SCREENING     Status: None   Collection Time    05/27/13 12:35 AM      Result Value Range Status   MRSA by PCR NEGATIVE  NEGATIVE Final   Comment:            The GeneXpert MRSA Assay (FDA     approved for NASAL specimens     only), is one component of a     comprehensive MRSA colonization     surveillance program. It is not     intended to diagnose MRSA     infection nor to guide or     monitor treatment for     MRSA infections.   Studies/Results: Dg Chest Port 1 View  05/27/2013    *RADIOLOGY REPORT*  Clinical Data: Respiratory failure, COPD, on ventilator  PORTABLE CHEST - 1 VIEW  Comparison: Portable exam 0820 hours compared to 05/26/2013  Findings: Endotracheal tube tip 6.1 cm above carina. Nasogastric tube again noted extending into abdomen. Normal heart size, mediastinal contours, and pulmonary vascularity. Changes of COPD again noted. Minimal persistent right basilar atelectasis. Remaining lungs clear. No pleural effusion or pneumothorax.  IMPRESSION: COPD changes with minimal right basilar atelectasis. No interval change.   Original Report Authenticated By: Ulyses Southward, M.D.   Dg Chest Port 1 View  05/26/2013   *RADIOLOGY REPORT*  Clinical Data: Shortness of breath.  Intubation.  PORTABLE CHEST - 1 VIEW  Comparison: Portable chest x-rays 12/15/2012, 01/13/2012, 01/12/2012.  Two-view chest x-ray 01/10/2012.  Findings: Endotracheal tube tip in satisfactory position projecting approximately 4 cm above the carina.  Nasogastric tube courses below the diaphragm and is looped in the stomach.  Cardiac silhouette normal in size, unchanged.  Thoracic aorta mildly atherosclerotic, unchanged.  Hilar and mediastinal contours otherwise unremarkable.  Emphysematous changes in the upper lobes, right greater than left, unchanged.  Stable calcified granulomata in the right lung.  No new abnormalities in either lung.  IMPRESSION:  1.  Endotracheal tube tip in satisfactory position projecting approximate 4 cm above the carina. 2.  Nasogastric tube courses below the diaphragm and is looped in the stomach. 3.  Stable COPD/emphysema.  Old granulomatous disease.  No acute cardiopulmonary disease.   Original Report Authenticated By: Hulan Saas, M.D.    Medications:  Prior to Admission:  Prescriptions prior to admission  Medication Sig Dispense Refill  . albuterol (PROAIR HFA) 108 (90 BASE) MCG/ACT inhaler Inhale 2 puffs into the lungs every 6 (six) hours as needed. Shortness of Breath      .  ALPRAZolam (XANAX) 0.25 MG tablet Take 0.25 mg by mouth 3 (three) times daily as needed for anxiety.      . fexofenadine-pseudoephedrine (ALLEGRA-D 24) 180-240 MG per 24 hr tablet Take 1 tablet by mouth daily.      [name] guaiFENesin (MUCINEX) 600 MG 12 hr tablet Take 1,200 mg by mouth daily. congestion      . ibuprofen (ADVIL,MOTRIN) 800 MG tablet Take 800 mg by mouth daily as needed. For pain      . ipratropium-albuterol (DUONEB) 0.5-2.5 (3) MG/3ML SOLN Take 3 mLs by nebulization every 4 (four) hours as needed. For shortness of breath      . levofloxacin (LEVAQUIN) 750 MG tablet Take 1 tablet (750 mg total) by mouth daily.  7 tablet  0  . metFORMIN (GLUCOPHAGE) 1000 MG tablet Take 1 tablet (1,000 mg total) by mouth 2 (two) times daily.  60 tablet  5  .  montelukast (SINGULAIR) 10 MG tablet Take 10 mg by mouth at bedtime.        . predniSONE (DELTASONE) 10 MG tablet Take 20-30 mg by mouth daily.      . rosuvastatin (CRESTOR) 10 MG tablet Take 10 mg by mouth daily.       Scheduled: . albuterol  2.5 mg Nebulization Q4H  . antiseptic oral rinse  15 mL Mouth Rinse QID  . azithromycin  500 mg Intravenous Q24H  . cefTRIAXone (ROCEPHIN)  IV  1 g Intravenous Q24H  . chlorhexidine  15 mL Mouth/Throat BID  . heparin  5,000 Units Subcutaneous Q8H  . insulin aspart  0-15 Units Subcutaneous Q4H  . ipratropium  0.5 mg Nebulization Q4H  . methylPREDNISolone (SOLU-MEDROL) injection  125 mg Intravenous Q6H  . pantoprazole (PROTONIX) IV  40 mg Intravenous QHS   Continuous: . sodium chloride 125 mL/hr at 05/28/13 0709  . fentaNYL infusion INTRAVENOUS 25 mcg/hr (05/28/13 0709)  . propofol Stopped (05/28/13 0709)   RUE:AVWUJW chloride, fentaNYL  Assesment: He has acute on chronic respiratory failure. He has COPD exacerbation. He does not appear to have pneumonia. This morning he became very anxious when weaning was attempted. Active Problems:   Respiratory failure, acute and chronic   COPD exacerbation   HTN  (hypertension)   DM (diabetes mellitus)   Tobacco abuse    Plan: I will have him take some Ativan and then see how he does and if he could be attempted again on extubation. He needs to start tube feedings today    LOS: 2 days   Carlisle Torgeson L 05/28/2013, 7:57 AM

## 2013-05-28 NOTE — Progress Notes (Signed)
UR chart review completed.  

## 2013-05-29 LAB — GLUCOSE, CAPILLARY: Glucose-Capillary: 142 mg/dL — ABNORMAL HIGH (ref 70–99)

## 2013-05-29 LAB — BLOOD GAS, ARTERIAL
Acid-Base Excess: 7.4 mmol/L — ABNORMAL HIGH (ref 0.0–2.0)
Acid-base deficit: 6 mmol/L — ABNORMAL HIGH (ref 0.0–2.0)
Bicarbonate: 33 mEq/L — ABNORMAL HIGH (ref 20.0–24.0)
FIO2: 35 %
Pressure support: 5 cmH2O
pO2, Arterial: 76.2 mmHg — ABNORMAL LOW (ref 80.0–100.0)

## 2013-05-29 MED ORDER — FUROSEMIDE 10 MG/ML IJ SOLN
20.0000 mg | Freq: Once | INTRAMUSCULAR | Status: AC
  Administered 2013-05-29: 20 mg via INTRAVENOUS
  Filled 2013-05-29: qty 2

## 2013-05-29 MED ORDER — ALBUTEROL SULFATE (5 MG/ML) 0.5% IN NEBU
2.5000 mg | INHALATION_SOLUTION | RESPIRATORY_TRACT | Status: DC | PRN
  Administered 2013-05-29: 2.5 mg via RESPIRATORY_TRACT
  Filled 2013-05-29: qty 0.5

## 2013-05-29 NOTE — Progress Notes (Signed)
Subjective: He is awake and alert. This is during his wakeup assessment. He is still on the ventilator. He failed weaning yesterday. His chest x-ray yesterday was suggestive of pneumonia which I think is probably the reason that he had so much respiratory problem. I do not think this pneumonia is after he was placed on the ventilator but I think it is a pre-existing problem that did not show as well because I do think he was slightly dehydrated until he had fluid boluses.  Objective: Vital signs in last 24 hours: Temp:  [98.6 F (37 C)-98.9 F (37.2 C)] 98.6 F (37 C) (08/26 0400) Pulse Rate:  [29-184] 73 (08/26 0645) Resp:  [0-22] 15 (08/26 0645) BP: (108-166)/(60-92) 130/74 mmHg (08/26 0645) SpO2:  [77 %-100 %] 100 % (08/26 0645) FiO2 (%):  [35 %] 35 % (08/26 0645) Weight:  [84.4 kg (186 lb 1.1 oz)] 84.4 kg (186 lb 1.1 oz) (08/26 0500) Weight change: 1.4 kg (3 lb 1.4 oz)    Intake/Output from previous day: 08/25 0701 - 08/26 0700 In: 4231.7 [I.V.:3240.2; NG/GT:691.5; IV Piggyback:300] Out: 1250 [Urine:1250]  PHYSICAL EXAM General appearance: alert, cooperative and mild distress Resp: diminished breath sounds bilaterally Cardio: regular rate and rhythm, S1, S2 normal, no murmur, click, rub or gallop GI: soft, non-tender; bowel sounds normal; no masses,  no organomegaly Extremities: edema Trace to 1+  Lab Results:    Basic Metabolic Panel:  Recent Labs  16/10/96 0543 05/28/13 0448  NA 135 140  K 4.2 3.8  CL 94* 100  CO2 37* 33*  GLUCOSE 180* 133*  BUN 20 27*  CREATININE 1.46* 1.13  CALCIUM 9.8 9.4   Liver Function Tests:  Recent Labs  05/26/13 1818 05/27/13 0543  AST 16 14  ALT 15 15  ALKPHOS 89 77  BILITOT <0.1* 0.2*  PROT 7.3 6.6  ALBUMIN 3.2* 3.0*   No results found for this basename: LIPASE, AMYLASE,  in the last 72 hours No results found for this basename: AMMONIA,  in the last 72 hours CBC:  Recent Labs  05/26/13 1818 05/27/13 0543  05/28/13 0448  WBC 6.3 4.5 8.6  NEUTROABS 5.3  --  7.4  HGB 13.5 12.0* 11.1*  HCT 44.8 39.2 35.2*  MCV 97.6 95.6 93.1  PLT 288 237 221   Cardiac Enzymes:  Recent Labs  05/26/13 1818 05/27/13 0026 05/27/13 0544  TROPONINI <0.30 <0.30 <0.30   BNP:  Recent Labs  05/26/13 1818 05/27/13 0026 05/28/13 0448  PROBNP 235.9* 427.8* 133.2*   D-Dimer: No results found for this basename: DDIMER,  in the last 72 hours CBG:  Recent Labs  05/28/13 0739 05/28/13 1140 05/28/13 1624 05/28/13 1959 05/29/13 0019 05/29/13 0411  GLUCAP 146* 138* 146* 146* 163* 171*   Hemoglobin A1C: No results found for this basename: HGBA1C,  in the last 72 hours Fasting Lipid Panel:  Recent Labs  05/29/13 0551  TRIG 154*   Thyroid Function Tests: No results found for this basename: TSH, T4TOTAL, FREET4, T3FREE, THYROIDAB,  in the last 72 hours Anemia Panel: No results found for this basename: VITAMINB12, FOLATE, FERRITIN, TIBC, IRON, RETICCTPCT,  in the last 72 hours Coagulation: No results found for this basename: LABPROT, INR,  in the last 72 hours Urine Drug Screen: Drugs of Abuse  No results found for this basename: labopia, cocainscrnur, labbenz, amphetmu, thcu, labbarb    Alcohol Level: No results found for this basename: ETH,  in the last 72 hours Urinalysis: No results found for  this basename: COLORURINE, APPERANCEUR, LABSPEC, PHURINE, GLUCOSEU, HGBUR, BILIRUBINUR, KETONESUR, PROTEINUR, UROBILINOGEN, NITRITE, LEUKOCYTESUR,  in the last 72 hours Misc. Labs:  ABGS  Recent Labs  05/28/13 0452  PHART 7.410  PO2ART 80.0  TCO2 30.8  HCO3 33.4*   CULTURES Recent Results (from the past 240 hour(s))  MRSA PCR SCREENING     Status: None   Collection Time    05/27/13 12:35 AM      Result Value Range Status   MRSA by PCR NEGATIVE  NEGATIVE Final   Comment:            The GeneXpert MRSA Assay (FDA     approved for NASAL specimens     only), is one component of a      comprehensive MRSA colonization     surveillance program. It is not     intended to diagnose MRSA     infection nor to guide or     monitor treatment for     MRSA infections.   Studies/Results: Dg Chest Port 1 View  05/28/2013   CLINICAL DATA:  57 year old male respiratory failure.  EXAM: PORTABLE CHEST - 1 VIEW  COMPARISON:  05/27/2013 and earlier.  FINDINGS: Portable AP upright view at 0636 hr. Endotracheal tube tip is stable between the level the clavicles and chronic. Enteric tube courses to the left upper quadrant and, tip not included. Insert contour no pneumothorax or pleural effusion. Stable the slightly lower lung volumes. No consolidation. Mildly increased patchy right lung base opacity.  IMPRESSION: 1.  Stable lines and tubes.  2. Mildly increased patchy right lung base opacity, favored pneumonia.   Electronically Signed   By: Augusto Gamble   On: 05/28/2013 08:24   Dg Chest Port 1 View  05/27/2013   *RADIOLOGY REPORT*  Clinical Data: Respiratory failure, COPD, on ventilator  PORTABLE CHEST - 1 VIEW  Comparison: Portable exam 0820 hours compared to 05/26/2013  Findings: Endotracheal tube tip 6.1 cm above carina. Nasogastric tube again noted extending into abdomen. Normal heart size, mediastinal contours, and pulmonary vascularity. Changes of COPD again noted. Minimal persistent right basilar atelectasis. Remaining lungs clear. No pleural effusion or pneumothorax.  IMPRESSION: COPD changes with minimal right basilar atelectasis. No interval change.   Original Report Authenticated By: Ulyses Southward, M.D.    Medications:  Prior to Admission:  Prescriptions prior to admission  Medication Sig Dispense Refill  . albuterol (PROAIR HFA) 108 (90 BASE) MCG/ACT inhaler Inhale 2 puffs into the lungs every 6 (six) hours as needed. Shortness of Breath      . ALPRAZolam (XANAX) 0.25 MG tablet Take 0.25 mg by mouth 3 (three) times daily as needed for anxiety.      . fexofenadine-pseudoephedrine (ALLEGRA-D  24) 180-240 MG per 24 hr tablet Take 1 tablet by mouth daily.      [name] guaiFENesin (MUCINEX) 600 MG 12 hr tablet Take 1,200 mg by mouth daily. congestion      . ibuprofen (ADVIL,MOTRIN) 800 MG tablet Take 800 mg by mouth daily as needed. For pain      . ipratropium-albuterol (DUONEB) 0.5-2.5 (3) MG/3ML SOLN Take 3 mLs by nebulization every 4 (four) hours as needed. For shortness of breath      . levofloxacin (LEVAQUIN) 750 MG tablet Take 1 tablet (750 mg total) by mouth daily.  7 tablet  0  . metFORMIN (GLUCOPHAGE) 1000 MG tablet Take 1 tablet (1,000 mg total) by mouth 2 (two) times daily.  60 tablet  5  .  montelukast (SINGULAIR) 10 MG tablet Take 10 mg by mouth at bedtime.        . predniSONE (DELTASONE) 10 MG tablet Take 20-30 mg by mouth daily.      . rosuvastatin (CRESTOR) 10 MG tablet Take 10 mg by mouth daily.       Scheduled: . albuterol  2.5 mg Nebulization Q4H  . antiseptic oral rinse  15 mL Mouth Rinse QID  . azithromycin  500 mg Intravenous Q24H  . cefTRIAXone (ROCEPHIN)  IV  1 g Intravenous Q24H  . chlorhexidine  15 mL Mouth/Throat BID  . feeding supplement (OXEPA)  1,000 mL Per Tube Q24H  . heparin  5,000 Units Subcutaneous Q8H  . insulin aspart  0-15 Units Subcutaneous Q4H  . ipratropium  0.5 mg Nebulization Q4H  . methylPREDNISolone (SOLU-MEDROL) injection  125 mg Intravenous Q6H  . pantoprazole (PROTONIX) IV  40 mg Intravenous QHS   Continuous: . sodium chloride 1,000 mL (05/29/13 0653)  . fentaNYL infusion INTRAVENOUS 50 mcg/hr (05/29/13 0600)  . propofol Stopped (05/29/13 0600)   ZOX:WRUEAV chloride, fentaNYL, LORazepam  Assesment: He has acute on chronic respiratory failure. I believe this is related to pneumonia which now shows on his chest x-ray. He has known severe COPD. Active Problems:   Respiratory failure, acute and chronic   COPD exacerbation   HTN (hypertension)   DM (diabetes mellitus)   Tobacco abuse    Plan: I think he may be able to come off the  ventilator today.    LOS: 3 days   Sascha Palma L 05/29/2013, 7:29 AM

## 2013-05-29 NOTE — Procedures (Signed)
Extubation Procedure Note  Patient Details:   Name: Jeff Ray DOB: 09/23/1956 MRN: [id number]   Airway Documentation:  Airway 8 mm (Active)  Secured at (cm) 24 cm 05/29/2013  7:51 AM  Measured From Lips 05/29/2013  7:51 AM  Secured Location Center 05/29/2013  7:51 AM  Secured By [employer] 05/29/2013  7:51 AM  Tube Holder Repositioned Yes 05/29/2013  7:51 AM  Cuff Pressure (cm H2O) 26 cm H2O 05/29/2013  4:21 AM  Site Condition Dry 05/29/2013  7:51 AM    Evaluation  O2 sats: stable throughout Complications: No apparent complications Patient did tolerate procedure well. Bilateral Breath Sounds: Diminished;Expiratory wheezes Suctioning: Oral;Airway Yes  Katheren Shams 05/29/2013, 9:15 AM

## 2013-05-29 NOTE — Plan of Care (Signed)
Problem: Phase I Progression Outcomes Goal: Code status addressed with pt/family Outcome: Progressing Patient remains a full code Goal: Patient tolerating weaning plan Outcome: Not Progressing Too anxious yesterday to wean.  Will attempt to pretreat with anti anxiety medication today

## 2013-05-29 NOTE — Progress Notes (Signed)
Patient found out of bed to toilet, with wife assisting, at 1700. Pt dyspneic at that time. Returned to bed, after having bowel movement, by staff. Ativan iv given at patient's request for anxiety. Dyspnea continue after at rest. Respiratory therapy called to assess patient. Fentanyl drip discontinued. Dr Juanetta Gosling consulted by phone. BIPAP initiated by Respiratory Therapist.

## 2013-05-29 NOTE — Progress Notes (Signed)
NAME:  Jeff Ray, Jeff Ray                 ACCOUNT NO.:  [id number]  MEDICAL RECORD NO.:  [id number]  LOCATION:  IC[facility]                          FACILITY:  [facility]  PHYSICIAN:  Satoya Feeley G. Renard Matter, MD   DATE OF BIRTH:  [birth year]  DATE OF PROCEDURE: DATE OF DISCHARGE:                                PROGRESS NOTE   SUBJECTIVE:  The patient remains on the ventilator.  Attempts to wean him yesterday were unsuccessful.  He does have COPD and respiratory failure.  Nursing staff has noticed slight edema in extremities.  OBJECTIVE:  GENERAL:  The patient remains on the ventilator. VITAL SIGNS:  Blood pressure 127/72, respirations 15, pulse 75, temp 98.6. HEENT:  Eyes, PERRLA.  TM, negative.  Oropharynx, benign. NECK:  Supple.  No JVD or thyroid abnormalities. HEART:  Regular rhythm.  No murmurs. LUNGS:  Occasional rhonchus heard over lower lung fields. ABDOMEN:  Soft.  No palpable organs or masses. EXTREMITIES:  Free of edema.  LABORATORY DATA:  The patient's most recent pH 7.410 with pCO2 53.7, pO2 80.0.  ASSESSMENT:  The patient was admitted with acute on chronic respiratory failure.  He does have chronic obstructive pulmonary disease, hypertension, type 2 diabetes.  PLAN:  To continue current regimen, IV steroids, antibiotics, IV Rocephin, Zithromax, Solu-Medrol.  Attempts will be made again today to wean him from respirator.  I have added a low dose of Lasix because of edema.     Jeff Dearmas G. Renard Matter, MD     AGM/MEDQ  D:  05/29/2013  T:  05/29/2013  Job:  [id number]

## 2013-05-30 ENCOUNTER — Inpatient Hospital Stay (HOSPITAL_COMMUNITY): Payer: Medicare HMO

## 2013-05-30 LAB — GLUCOSE, CAPILLARY
Glucose-Capillary: 119 mg/dL — ABNORMAL HIGH (ref 70–99)
Glucose-Capillary: 227 mg/dL — ABNORMAL HIGH (ref 70–99)
Glucose-Capillary: 129 mg/dL — ABNORMAL HIGH (ref 70–99)

## 2013-05-30 MED ORDER — GUAIFENESIN ER 600 MG PO TB12
1200.0000 mg | ORAL_TABLET | Freq: Two times a day (BID) | ORAL | Status: DC
  Administered 2013-05-30 – 2013-06-03 (×10): 1200 mg via ORAL
  Filled 2013-05-30 (×10): qty 2

## 2013-05-30 MED ORDER — NEPRO/CARBSTEADY PO LIQD: 120.0000 mL | Freq: Two times a day (BID) | ORAL | Status: DC

## 2013-05-30 MED ORDER — ENSURE COMPLETE PO LIQD
237.0000 mL | Freq: Two times a day (BID) | ORAL | Status: DC
  Administered 2013-05-31 – 2013-06-02 (×5): 237 mL via ORAL

## 2013-05-30 MED ORDER — FUROSEMIDE 10 MG/ML IJ SOLN
40.0000 mg | Freq: Once | INTRAMUSCULAR | Status: AC
  Administered 2013-05-30: 40 mg via INTRAVENOUS
  Filled 2013-05-30: qty 4

## 2013-05-30 NOTE — Progress Notes (Signed)
NAME:  Jeff Ray, Jeff Ray                 ACCOUNT NO.:  [id number]  MEDICAL RECORD NO.:  [id number]  LOCATION:  IC11                          FACILITY:  [facility]  PHYSICIAN:  Shabre Kreher G. Vonita Calloway, MD   DATE OF BIRTH:  10/31/1955  DATE OF PROCEDURE: DATE OF DISCHARGE:                                PROGRESS NOTE   This patient has been extubated successfully except for generalized weakness has improved and is being treated for community-acquired pneumonia.  OBJECTIVE:  GENERAL:  The patient is alert and oriented. VITAL SIGNS:  Blood pressure 99/65, respirations 23, pulse 109, temp 98. HEENT:  Eyes, PERRLA.  TMs negative.  Oropharynx benign. NECK:  Supple.  No JVD or thyroid abnormalities. HEART:  Regular rhythm.  No murmurs. LUNGS:  Occasional rhonchi heard over lower lung fields. ABDOMEN:  Soft.  No palpable organs or masses. EXTREMITIES:  Free of edema.  ASSESSMENT:  The patient was admitted with acute on chronic respiratory failure.  He is a type 2 diabetic and has hypertension.  Continue current regimen which is IV steroids, antibiotics, IV Rocephin, Zithromax, Solu-Medrol.  Continue current regimen.     Glenda Kunst G. Renard Matter, MD     AGM/MEDQ  D:  05/30/2013  T:  05/30/2013  Job:  [id number]

## 2013-05-30 NOTE — Progress Notes (Addendum)
Nutrition Follow-up  INTERVENTION: Ensure Complete po BID, each supplement provides 350 kcal and 13 grams of protein.  NUTRITION DIAGNOSIS: Inadequate oral intake; progressing (pt extubated and diet advanced)  Goal: Pt to meet >/= 90% of their estimated nutrition needs   Monitor:  Diet advancement, tolerance, labs, I/O's and wt changes   57 y.o. male   ASSESSMENT: Patient Active Problem List   Diagnosis Date Noted  . Respiratory failure, acute and chronic 05/26/2013  . COPD exacerbation 05/26/2013  . HTN (hypertension) 05/26/2013  . DM (diabetes mellitus) 05/26/2013  . Tobacco abuse 05/26/2013   Pt extubated and diet advanced today. Will schedule nutrition supplement between meals to optimize nutrition intake.  Height: Ht Readings from Last 1 Encounters:  05/28/13 6' (1.829 m)    Weight: Wt Readings from Last 1 Encounters:  05/30/13 168 lb 6.9 oz (76.4 kg)    Ideal Body Weight: 178# (80.9 kg)   Wt Readings from Last 10 Encounters:  05/30/13 168 lb 6.9 oz (76.4 kg)  12/17/12 179 lb 10.8 oz (81.5 kg)  01/16/12 164 lb 10.9 oz (74.7 kg)  08/26/11 174 lb 13.2 oz (79.3 kg)  06/07/11 179 lb 0.2 oz (81.2 kg)  04/06/11 174 lb 9.7 oz (79.2 kg)    Usual Body Weight: 175-180#  BMI:  Body mass index is 22.84 kg/(m^2).normal range  Estimated Nutritional Needs: Kcal: 2200-2560 kcal Protein: 108-124 gr Fluid: >2500 (normal needs)  Skin: No issues noted  Diet Order: Full Liquid  EDUCATION NEEDS: -Education not appropriate at this time   Intake/Output Summary (Last 24 hours) at 05/30/13 1201 Last data filed at 05/30/13 1030  Gross per 24 hour  Intake   3040 ml  Output   2600 ml  Net    440 ml    Last BM: 05/30/13  Labs:   Recent Labs Lab 05/26/13 1818 05/27/13 0543 05/28/13 0448  NA 136 135 140  K 5.2* 4.2 3.8  CL 93* 94* 100  CO2 40* 37* 33*  BUN 10 20 27*  CREATININE 0.71 1.46* 1.13  CALCIUM 9.8 9.8 9.4  GLUCOSE 217* 180* 133*    CBG  (last 3)   Recent Labs  05/30/13 0358 05/30/13 0739 05/30/13 1133  GLUCAP 128* 119* 227*    Scheduled Meds: . albuterol  2.5 mg Nebulization Q4H  . azithromycin  500 mg Intravenous Q24H  . cefTRIAXone (ROCEPHIN)  IV  1 g Intravenous Q24H  . guaiFENesin  1,200 mg Oral BID  . heparin  5,000 Units Subcutaneous Q8H  . insulin aspart  0-15 Units Subcutaneous Q4H  . ipratropium  0.5 mg Nebulization Q4H  . methylPREDNISolone (SOLU-MEDROL) injection  125 mg Intravenous Q6H  . pantoprazole (PROTONIX) IV  40 mg Intravenous QHS    Continuous Infusions: . sodium chloride 125 mL/hr at 05/30/13 1157    Past Medical History  Diagnosis Date  . COPD (chronic obstructive pulmonary disease)   . Asthma   . Hypertension   . Anxiety   . Diabetes mellitus     Past Surgical History  Procedure Laterality Date  . Back surgery      Royann Shivers MS,RD,LDN,CSG Office: #[phone] Pager: [phone]

## 2013-05-30 NOTE — Progress Notes (Signed)
Subjective: He had more trouble with breathing last night with some of that seems to have been from getting in a hurry to get to the bathroom. He says he feels okay now. He's on nasal oxygen now. He uses BiPAP at home and was on BiPAP last night.  Objective: Vital signs in last 24 hours: Temp:  [97.7 F (36.5 C)-98.4 F (36.9 C)] 98.3 F (36.8 C) (08/26 2315) Pulse Rate:  [32-110] 88 (08/27 0645) Resp:  [9-29] 18 (08/27 0645) BP: (109-152)/(60-116) 142/78 mmHg (08/27 0600) SpO2:  [76 %-100 %] 99 % (08/27 0729) FiO2 (%):  [35 %] 35 % (08/27 0320) Weight change:  Last BM Date:  (none since admission)  Intake/Output from previous day: 08/26 0701 - 08/27 0700 In: 2925 [I.V.:2625; IV Piggyback:300] Out: 1525 [Urine:1525]  PHYSICAL EXAM General appearance: alert, cooperative and mild distress Resp: rhonchi bilaterally Cardio: regular rate and rhythm, S1, S2 normal, no murmur, click, rub or gallop GI: soft, non-tender; bowel sounds normal; no masses,  no organomegaly Extremities: extremities normal, atraumatic, no cyanosis or edema  Lab Results:    Basic Metabolic Panel:  Recent Labs  16/10/96 0448  NA 140  K 3.8  CL 100  CO2 33*  GLUCOSE 133*  BUN 27*  CREATININE 1.13  CALCIUM 9.4   Liver Function Tests: No results found for this basename: AST, ALT, ALKPHOS, BILITOT, PROT, ALBUMIN,  in the last 72 hours No results found for this basename: LIPASE, AMYLASE,  in the last 72 hours No results found for this basename: AMMONIA,  in the last 72 hours CBC:  Recent Labs  05/28/13 0448  WBC 8.6  NEUTROABS 7.4  HGB 11.1*  HCT 35.2*  MCV 93.1  PLT 221   Cardiac Enzymes: No results found for this basename: CKTOTAL, CKMB, CKMBINDEX, TROPONINI,  in the last 72 hours BNP:  Recent Labs  05/28/13 0448  PROBNP 133.2*   D-Dimer: No results found for this basename: DDIMER,  in the last 72 hours CBG:  Recent Labs  05/29/13 1120 05/29/13 1700 05/29/13 1947  05/30/13 0003 05/30/13 0358 05/30/13 0739  GLUCAP 99 173* 157* 146* 128* 119*   Hemoglobin A1C: No results found for this basename: HGBA1C,  in the last 72 hours Fasting Lipid Panel:  Recent Labs  05/29/13 0551  TRIG 154*   Thyroid Function Tests: No results found for this basename: TSH, T4TOTAL, FREET4, T3FREE, THYROIDAB,  in the last 72 hours Anemia Panel: No results found for this basename: VITAMINB12, FOLATE, FERRITIN, TIBC, IRON, RETICCTPCT,  in the last 72 hours Coagulation: No results found for this basename: LABPROT, INR,  in the last 72 hours Urine Drug Screen: Drugs of Abuse  No results found for this basename: labopia, cocainscrnur, labbenz, amphetmu, thcu, labbarb    Alcohol Level: No results found for this basename: ETH,  in the last 72 hours Urinalysis: No results found for this basename: COLORURINE, APPERANCEUR, LABSPEC, PHURINE, GLUCOSEU, HGBUR, BILIRUBINUR, KETONESUR, PROTEINUR, UROBILINOGEN, NITRITE, LEUKOCYTESUR,  in the last 72 hours Misc. Labs:  ABGS  Recent Labs  05/29/13 0819  PHART 7.341*  PO2ART 76.2*  TCO2 30.3  HCO3 33.0*   CULTURES Recent Results (from the past 240 hour(s))  MRSA PCR SCREENING     Status: None   Collection Time    05/27/13 12:35 AM      Result Value Range Status   MRSA by PCR NEGATIVE  NEGATIVE Final   Comment:  The GeneXpert MRSA Assay (FDA     approved for NASAL specimens     only), is one component of a     comprehensive MRSA colonization     surveillance program. It is not     intended to diagnose MRSA     infection nor to guide or     monitor treatment for     MRSA infections.   Studies/Results: No results found.  Medications:  Scheduled: . albuterol  2.5 mg Nebulization Q4H  . azithromycin  500 mg Intravenous Q24H  . cefTRIAXone (ROCEPHIN)  IV  1 g Intravenous Q24H  . furosemide  40 mg Intravenous Once  . guaiFENesin  1,200 mg Oral BID  . heparin  5,000 Units Subcutaneous Q8H  . insulin  aspart  0-15 Units Subcutaneous Q4H  . ipratropium  0.5 mg Nebulization Q4H  . methylPREDNISolone (SOLU-MEDROL) injection  125 mg Intravenous Q6H  . pantoprazole (PROTONIX) IV  40 mg Intravenous QHS   Continuous: . sodium chloride 125 mL/hr at 05/30/13 0500   WGN:FAOZHY chloride, albuterol, LORazepam  Assesment: He has acute on chronic respiratory failure on the basis of community-acquired pneumonia and COPD. He is improving. He had an episode of increased shortness of breath yesterday evening after being extubated yesterday morning. He seems to be over that period he is now on nasal oxygen. He has no new complaints except he is weak and still somewhat short of breath. Active Problems:   Respiratory failure, acute and chronic   COPD exacerbation   HTN (hypertension)   DM (diabetes mellitus)   Tobacco abuse    Plan: He will receive Lasix times one today. I think we can advance his diet. Continue his other treatments.    LOS: 4 days   Michaele Amundson L 05/30/2013, 7:58 AM

## 2013-05-30 NOTE — Clinical Documentation Improvement (Signed)
THIS DOCUMENT IS NOT A PERMANENT PART OF THE MEDICAL RECORD  Please update your documentation with the medical record to reflect your response to this query. If you need help knowing how to do this please call [phone].  05/30/13  Dear Dr. Renard Matter Marton Redwood A review of the patient medical record has revealed the following indicators.   Based on your clinical judgment, please clarify and document in a progress note and/or discharge summary the clinical condition associated with the following supporting information:  Patient admitted with Acute on Chronic respiratory failure and intubated. Also with pneumonia and COPD exacerbation  ABG's  8/23  8/24  8/25  8/26 pH 7.198  7.428  7.410  7.341 pCO2 110  56.2  53.7  62.7 pO2 520  77.4  80  76.2 HCO3 41.1  36.5  33.4  33.0           Possible Clinical Conditions?   " Respiratory Acidosis " Metabolic Acidosis " Respiratory Alkalosis " Metabolic Alkalosis " Other Condition__________________ " Cannot Clinically Determine     You may use possible, probable, or suspect with inpatient documentation. possible, probable, suspected diagnoses MUST be documented at the time of discharge  Reviewed: additional documentation in the medical record  Thank Angelene Giovanni RN Clinical Documentation Specialist: [phone] [facility] Health Information Management [place]

## 2013-05-31 LAB — BASIC METABOLIC PANEL
BUN: 20 mg/dL (ref 6–23)
CO2: 36 mEq/L — ABNORMAL HIGH (ref 19–32)
Calcium: 8.8 mg/dL (ref 8.4–10.5)
Chloride: 100 mEq/L (ref 96–112)
Creatinine, Ser: 0.77 mg/dL (ref 0.50–1.35)
Glucose, Bld: 153 mg/dL — ABNORMAL HIGH (ref 70–99)
Potassium: 4 mEq/L (ref 3.5–5.1)
Sodium: 139 mEq/L (ref 135–145)

## 2013-05-31 LAB — GLUCOSE, CAPILLARY
Glucose-Capillary: 117 mg/dL — ABNORMAL HIGH (ref 70–99)
Glucose-Capillary: 147 mg/dL — ABNORMAL HIGH (ref 70–99)
Glucose-Capillary: 185 mg/dL — ABNORMAL HIGH (ref 70–99)
Glucose-Capillary: 212 mg/dL — ABNORMAL HIGH (ref 70–99)
Glucose-Capillary: 161 mg/dL — ABNORMAL HIGH (ref 70–99)

## 2013-05-31 LAB — CBC WITH DIFFERENTIAL/PLATELET
Basophils Absolute: 0 10*3/uL (ref 0.0–0.1)
Basophils Relative: 0 % (ref 0–1)
Eosinophils Absolute: 0 10*3/uL (ref 0.0–0.7)
Lymphs Abs: 0.7 10*3/uL (ref 0.7–4.0)
MCH: 29.2 pg (ref 26.0–34.0)
MCHC: 32.2 g/dL (ref 30.0–36.0)
Neutrophils Relative %: 89 % — ABNORMAL HIGH (ref 43–77)
Platelets: 179 10*3/uL (ref 150–400)
RBC: 4.07 MIL/uL — ABNORMAL LOW (ref 4.22–5.81)
RDW: 13 % (ref 11.5–15.5)

## 2013-05-31 MED ORDER — PANTOPRAZOLE SODIUM 40 MG PO TBEC
40.0000 mg | DELAYED_RELEASE_TABLET | Freq: Every day | ORAL | Status: DC
  Administered 2013-05-31 – 2013-06-03 (×4): 40 mg via ORAL
  Filled 2013-05-31 (×4): qty 1

## 2013-05-31 MED ORDER — AZITHROMYCIN 250 MG PO TABS
500.0000 mg | ORAL_TABLET | Freq: Every day | ORAL | Status: DC
  Administered 2013-05-31 – 2013-06-02 (×3): 500 mg via ORAL
  Filled 2013-05-31 (×3): qty 2

## 2013-05-31 NOTE — Progress Notes (Signed)
PT ATTEMPTTED TO GET OOB FOR [employer]/O ASSISTANCE AND BECAME EXTREMELY SOB AND VERY ANXIOUS. ASSISTED BACK TO BED. PT REQUESTED BIPAP BE REAPPLIED. AFTER BIPAP BACK IN PLACE  ANXIETY RESOLVED AND SOB RESOLVED.

## 2013-05-31 NOTE — Progress Notes (Signed)
Subjective: He says he feels better. He has much less shortness of breath. He is coughing a little bit but not much  Objective: Vital signs in last 24 hours: Temp:  [97.9 F (36.6 C)-98.4 F (36.9 C)] 98.4 F (36.9 C) (08/28 0400) Pulse Rate:  [61-120] 83 (08/28 0600) Resp:  [9-34] 21 (08/28 0600) BP: (99-158)/(65-128) 157/83 mmHg (08/28 0600) SpO2:  [96 %-100 %] 98 % (08/28 0643) FiO2 (%):  [35 %] 35 % (08/27 2347) Weight:  [76.9 kg (169 lb 8.5 oz)] 76.9 kg (169 lb 8.5 oz) (08/28 0500) Weight change:  Last BM Date: 05/30/13  Intake/Output from previous day: 08/27 0701 - 08/28 0700 In: 3645 [P.O.:470; I.V.:2875; IV Piggyback:300] Out: 2700 [Urine:2700]  PHYSICAL EXAM General appearance: alert, cooperative and mild distress Resp: clear to auscultation bilaterally Cardio: regular rate and rhythm, S1, S2 normal, no murmur, click, rub or gallop GI: soft, non-tender; bowel sounds normal; no masses,  no organomegaly Extremities: extremities normal, atraumatic, no cyanosis or edema  Lab Results:    Basic Metabolic Panel:  Recent Labs  16/10/96 0631  NA 139  K 4.0  CL 100  CO2 36*  GLUCOSE 153*  BUN 20  CREATININE 0.77  CALCIUM 8.8   Liver Function Tests: No results found for this basename: AST, ALT, ALKPHOS, BILITOT, PROT, ALBUMIN,  in the last 72 hours No results found for this basename: LIPASE, AMYLASE,  in the last 72 hours No results found for this basename: AMMONIA,  in the last 72 hours CBC:  Recent Labs  05/31/13 0631  WBC 9.7  NEUTROABS 8.6*  HGB 11.9*  HCT 37.0*  MCV 90.9  PLT 179   Cardiac Enzymes: No results found for this basename: CKTOTAL, CKMB, CKMBINDEX, TROPONINI,  in the last 72 hours BNP: No results found for this basename: PROBNP,  in the last 72 hours D-Dimer: No results found for this basename: DDIMER,  in the last 72 hours CBG:  Recent Labs  05/30/13 1133 05/30/13 1625 05/30/13 1941 05/31/13 0003 05/31/13 0359 05/31/13 0743   GLUCAP 227* 154* 129* 91 117* 147*   Hemoglobin A1C: No results found for this basename: HGBA1C,  in the last 72 hours Fasting Lipid Panel:  Recent Labs  05/29/13 0551  TRIG 154*   Thyroid Function Tests: No results found for this basename: TSH, T4TOTAL, FREET4, T3FREE, THYROIDAB,  in the last 72 hours Anemia Panel: No results found for this basename: VITAMINB12, FOLATE, FERRITIN, TIBC, IRON, RETICCTPCT,  in the last 72 hours Coagulation: No results found for this basename: LABPROT, INR,  in the last 72 hours Urine Drug Screen: Drugs of Abuse  No results found for this basename: labopia, cocainscrnur, labbenz, amphetmu, thcu, labbarb    Alcohol Level: No results found for this basename: ETH,  in the last 72 hours Urinalysis: No results found for this basename: COLORURINE, APPERANCEUR, LABSPEC, PHURINE, GLUCOSEU, HGBUR, BILIRUBINUR, KETONESUR, PROTEINUR, UROBILINOGEN, NITRITE, LEUKOCYTESUR,  in the last 72 hours Misc. Labs:  ABGS  Recent Labs  05/29/13 0819  PHART 7.341*  PO2ART 76.2*  TCO2 30.3  HCO3 33.0*   CULTURES Recent Results (from the past 240 hour(s))  MRSA PCR SCREENING     Status: None   Collection Time    05/27/13 12:35 AM      Result Value Range Status   MRSA by PCR NEGATIVE  NEGATIVE Final   Comment:            The GeneXpert MRSA Assay (FDA  approved for NASAL specimens     only), is one component of a     comprehensive MRSA colonization     surveillance program. It is not     intended to diagnose MRSA     infection nor to guide or     monitor treatment for     MRSA infections.   Studies/Results: Dg Chest Port 1 View  05/30/2013   *RADIOLOGY REPORT*  Clinical Data: Respiratory failure.  PORTABLE CHEST - 1 VIEW  Comparison: 05/28/2013  Findings: Hyperinflation. Numerous leads and wires project over the chest.  Apical lordotic patient positioning. Midline trachea. Normal heart size.  No pleural effusion or pneumothorax.  Mild left base scarring  or subsegmental atelectasis.  Improved right base aeration with equivocal residual airspace disease identified medially.  IMPRESSION: Improved right base aeration with improved to resolved medial airspace disease.  Underlying hyperinflation/COPD.   Original Report Authenticated By: Jeronimo Greaves, M.D.    Medications:  Prior to Admission:  Prescriptions prior to admission  Medication Sig Dispense Refill  . albuterol (PROAIR HFA) 108 (90 BASE) MCG/ACT inhaler Inhale 2 puffs into the lungs every 6 (six) hours as needed. Shortness of Breath      . ALPRAZolam (XANAX) 0.25 MG tablet Take 0.25 mg by mouth 3 (three) times daily as needed for anxiety.      [name] ibuprofen (ADVIL,MOTRIN) 800 MG tablet Take 800 mg by mouth daily as needed. For pain      . ipratropium-albuterol (DUONEB) 0.5-2.5 (3) MG/3ML SOLN Take 3 mLs by nebulization every 4 (four) hours as needed. For shortness of breath      . metFORMIN (GLUCOPHAGE) 1000 MG tablet Take 1 tablet (1,000 mg total) by mouth 2 (two) times daily.  60 tablet  5  . montelukast (SINGULAIR) 10 MG tablet Take 10 mg by mouth at bedtime.        . predniSONE (DELTASONE) 10 MG tablet Take 20-30 mg by mouth daily.      . rosuvastatin (CRESTOR) 10 MG tablet Take 10 mg by mouth daily.       Scheduled: . albuterol  2.5 mg Nebulization Q4H  . azithromycin  500 mg Intravenous Q24H  . cefTRIAXone (ROCEPHIN)  IV  1 g Intravenous Q24H  . feeding supplement  237 mL Oral BID BM  . guaiFENesin  1,200 mg Oral BID  . heparin  5,000 Units Subcutaneous Q8H  . insulin aspart  0-15 Units Subcutaneous Q4H  . ipratropium  0.5 mg Nebulization Q4H  . methylPREDNISolone (SOLU-MEDROL) injection  125 mg Intravenous Q6H  . pantoprazole (PROTONIX) IV  40 mg Intravenous QHS   Continuous: . sodium chloride 125 mL/hr at 05/31/13 0600   ZOX:WRUEAV chloride, albuterol, LORazepam  Assesment: He has respiratory failure acute on chronic requiring intubation and mechanical ventilation but has had  extubated and is doing better. He has pneumonia which I do not believe his ventilator associated but I think was probably the precipitating cause of this. Active Problems:   Respiratory failure, acute and chronic   COPD exacerbation   HTN (hypertension)   DM (diabetes mellitus)   Tobacco abuse    Plan: Continue current treatments he is clearly much improved.    LOS: 5 days   Zenya Hickam L 05/31/2013, 8:52 AM

## 2013-05-31 NOTE — Progress Notes (Signed)
PHARMACIST - PHYSICIAN COMMUNICATION DR:   Renard Matter CONCERNING: Antibiotic IV to Oral Route Change Policy  RECOMMENDATION: This patient is receiving Zithromax by the intravenous route.  Based on criteria approved by the Pharmacy and Therapeutics Committee, the antibiotic(s) is/are being converted to the equivalent oral dose form(s).   DESCRIPTION: These criteria include:  Patient being treated for a respiratory tract infection, urinary tract infection, cellulitis or clostridium difficile associated diarrhea if on metronidazole  The patient is not neutropenic and does not exhibit a GI malabsorption state  The patient is eating (either orally or via tube) and/or has been taking other orally administered medications for a least 24 hours  The patient is improving clinically and has a Tmax < 100.5  Also changed Protonix to oral route.  If you have questions about this conversion, please contact the Pharmacy Department  [x]   [phone] )  [facility] []   [phone] )  [facility]  []   [phone] )  [facility] []   [phone] )  [facility]   Junita Push, PharmD, BCPS

## 2013-05-31 NOTE — Progress Notes (Signed)
UR chart review completed.  

## 2013-05-31 NOTE — Progress Notes (Signed)
Patient taken off bipap and placed on 5LNC, bipap on standby at bedside for when patient feels like he needs again. RT will continue to monitor

## 2013-06-01 LAB — GLUCOSE, CAPILLARY
Glucose-Capillary: 79 mg/dL (ref 70–99)
Glucose-Capillary: 113 mg/dL — ABNORMAL HIGH (ref 70–99)
Glucose-Capillary: 213 mg/dL — ABNORMAL HIGH (ref 70–99)

## 2013-06-01 MED ORDER — METHYLPREDNISOLONE SODIUM SUCC 40 MG IJ SOLR
40.0000 mg | Freq: Four times a day (QID) | INTRAMUSCULAR | Status: DC
  Administered 2013-06-01 – 2013-06-02 (×4): 40 mg via INTRAVENOUS
  Filled 2013-06-01 (×4): qty 1

## 2013-06-01 NOTE — Progress Notes (Signed)
NAME:  Jeff Ray, Jeff Ray                 ACCOUNT NO.:  [id number]  MEDICAL RECORD NO.:  [id number]  LOCATION:  IC11                          FACILITY:  [facility]  PHYSICIAN:  Jamylah Marinaccio G. Renard Matter, MD   DATE OF BIRTH:  [birth year]  DATE OF PROCEDURE: DATE OF DISCHARGE:                                PROGRESS NOTE   SUBJECTIVE:  This patient has been extubated.  He does have community- acquired pneumonia.  He is feeling some better.  OBJECTIVE:  VITAL SIGNS:  Blood pressure 159/78, respirations 24, pulse 101, temp 98.1. HEENT:  Eyes, PERRLA.  TMs negative.  Oropharynx benign. NECK:  Supple.  No JVD or thyroid abnormalities. HEART:  Regular rhythm.  No murmurs. LUNGS:  Occasional rhonchus heard over lower lung fields. ABDOMEN:  Soft.  No palpable organs or masses. EXTREMITIES:  Free of edema.  ASSESSMENT:  The patient does have recent acute respiratory failure with chronic obstructive pulmonary disease, hypertension, and diabetes.  He will continue his IV steroids, antibiotics, IV Rocephin, Zithromax, Solu- Medrol.  The patient has improved to some degree, but still has and was thought to have pneumonia which precipitated this as well.     [name] G. Renard Matter, MD     AGM/MEDQ  D:  05/31/2013  T:  06/01/2013  Job:  [id number]

## 2013-06-01 NOTE — Progress Notes (Signed)
Patient transferred to room 333. Report given to Deberah Castle RN. Vital signs stable at transfer.

## 2013-06-01 NOTE — Progress Notes (Signed)
Subjective: He says he is better. He use BiPAP overnight.  Objective: Vital signs in last 24 hours: Temp:  [97.6 F (36.4 C)-97.9 F (36.6 C)] 97.9 F (36.6 C) (08/29 0400) Pulse Rate:  [58-101] 59 (08/29 0804) Resp:  [13-26] 14 (08/29 0804) BP: (80-159)/(48-85) 127/81 mmHg (08/29 0804) SpO2:  [91 %-99 %] 97 % (08/29 0804) FiO2 (%):  [35 %] 35 % (08/29 0804) Weight:  [78 kg (171 lb 15.3 oz)] 78 kg (171 lb 15.3 oz) (08/29 0500) Weight change: 1.6 kg (3 lb 8.4 oz) Last BM Date: 05/30/13  Intake/Output from previous day: 08/28 0701 - 08/29 0700 In: 4108.3 [P.O.:1200; I.V.:2858.3; IV Piggyback:50] Out: 1825 [Urine:1825]  PHYSICAL EXAM General appearance: alert, cooperative and mild distress Resp: diminished breath sounds bilaterally Cardio: regular rate and rhythm, S1, S2 normal, no murmur, click, rub or gallop GI: soft, non-tender; bowel sounds normal; no masses,  no organomegaly Extremities: extremities normal, atraumatic, no cyanosis or edema  Lab Results:    Basic Metabolic Panel:  Recent Labs  16/10/96 0631  NA 139  K 4.0  CL 100  CO2 36*  GLUCOSE 153*  BUN 20  CREATININE 0.77  CALCIUM 8.8   Liver Function Tests: No results found for this basename: AST, ALT, ALKPHOS, BILITOT, PROT, ALBUMIN,  in the last 72 hours No results found for this basename: LIPASE, AMYLASE,  in the last 72 hours No results found for this basename: AMMONIA,  in the last 72 hours CBC:  Recent Labs  05/31/13 0631  WBC 9.7  NEUTROABS 8.6*  HGB 11.9*  HCT 37.0*  MCV 90.9  PLT 179   Cardiac Enzymes: No results found for this basename: CKTOTAL, CKMB, CKMBINDEX, TROPONINI,  in the last 72 hours BNP: No results found for this basename: PROBNP,  in the last 72 hours D-Dimer: No results found for this basename: DDIMER,  in the last 72 hours CBG:  Recent Labs  05/31/13 0743 05/31/13 1112 05/31/13 1612 05/31/13 2002 05/31/13 2341 06/01/13 0330  GLUCAP 147* 185* 212* 161* 84  79   Hemoglobin A1C: No results found for this basename: HGBA1C,  in the last 72 hours Fasting Lipid Panel: No results found for this basename: CHOL, HDL, LDLCALC, TRIG, CHOLHDL, LDLDIRECT,  in the last 72 hours Thyroid Function Tests: No results found for this basename: TSH, T4TOTAL, FREET4, T3FREE, THYROIDAB,  in the last 72 hours Anemia Panel: No results found for this basename: VITAMINB12, FOLATE, FERRITIN, TIBC, IRON, RETICCTPCT,  in the last 72 hours Coagulation: No results found for this basename: LABPROT, INR,  in the last 72 hours Urine Drug Screen: Drugs of Abuse  No results found for this basename: labopia, cocainscrnur, labbenz, amphetmu, thcu, labbarb    Alcohol Level: No results found for this basename: ETH,  in the last 72 hours Urinalysis: No results found for this basename: COLORURINE, APPERANCEUR, LABSPEC, PHURINE, GLUCOSEU, HGBUR, BILIRUBINUR, KETONESUR, PROTEINUR, UROBILINOGEN, NITRITE, LEUKOCYTESUR,  in the last 72 hours Misc. Labs:  ABGS No results found for this basename: PHART, PCO2, PO2ART, TCO2, HCO3,  in the last 72 hours CULTURES Recent Results (from the past 240 hour(s))  MRSA PCR SCREENING     Status: None   Collection Time    05/27/13 12:35 AM      Result Value Range Status   MRSA by PCR NEGATIVE  NEGATIVE Final   Comment:            The GeneXpert MRSA Assay (FDA     approved for  NASAL specimens     only), is one component of a     comprehensive MRSA colonization     surveillance program. It is not     intended to diagnose MRSA     infection nor to guide or     monitor treatment for     MRSA infections.   Studies/Results: Dg Chest Port 1 View  05/30/2013   *RADIOLOGY REPORT*  Clinical Data: Respiratory failure.  PORTABLE CHEST - 1 VIEW  Comparison: 05/28/2013  Findings: Hyperinflation. Numerous leads and wires project over the chest.  Apical lordotic patient positioning. Midline trachea. Normal heart size.  No pleural effusion or  pneumothorax.  Mild left base scarring or subsegmental atelectasis.  Improved right base aeration with equivocal residual airspace disease identified medially.  IMPRESSION: Improved right base aeration with improved to resolved medial airspace disease.  Underlying hyperinflation/COPD.   Original Report Authenticated By: Jeronimo Greaves, M.D.    Medications:  Scheduled: . albuterol  2.5 mg Nebulization Q4H  . azithromycin  500 mg Oral QHS  . cefTRIAXone (ROCEPHIN)  IV  1 g Intravenous Q24H  . feeding supplement  237 mL Oral BID BM  . guaiFENesin  1,200 mg Oral BID  . heparin  5,000 Units Subcutaneous Q8H  . insulin aspart  0-15 Units Subcutaneous Q4H  . ipratropium  0.5 mg Nebulization Q4H  . methylPREDNISolone (SOLU-MEDROL) injection  125 mg Intravenous Q6H  . pantoprazole  40 mg Oral Daily   Continuous: . sodium chloride 125 mL/hr at 06/01/13 0600   ZOX:WRUEAV chloride, albuterol, LORazepam  Assesment: He has acute on chronic respiratory failure from COPD and community acquired pneumonia. He is improving. He uses BiPAP at night and may be a candidate for noninvasive ventilator. Active Problems:   Respiratory failure, acute and chronic   COPD exacerbation   HTN (hypertension)   DM (diabetes mellitus)   Tobacco abuse    Plan: Continue current treatments. I will reduce his IV steroids    LOS: 6 days   Adriana Lina L 06/01/2013, 8:26 AM

## 2013-06-01 NOTE — Progress Notes (Signed)
NAME:  Jeff Ray, Jeff Ray                 ACCOUNT NO.:  [id number]  MEDICAL RECORD NO.:  [id number]  LOCATION:                                 FACILITY:  PHYSICIAN:  Beverlee Wilmarth G. Renard Matter, MD   DATE OF BIRTH:  08-13-1956  DATE OF PROCEDURE:  06/01/2013 DATE OF DISCHARGE:                                PROGRESS NOTE   SUBJECTIVE:  This patient had been extubated, had a fairly good night. Does have generalized weakness and some dyspnea, but much less than previously noted.  He is being treated for community-acquired pneumonia. and chronic respiratory failure.  OBJECTIVE:  VITAL SIGNS:  Blood pressure 114/54, respirations 13, pulse 59, temp 97.9, current O2 saturation 95%. HEENT:  Eyes, PERRLA.  TM negative.  Oropharynx benign. NECK:  Supple.  No JVD or thyroid abnormalities. HEART:  Regular rhythm.  No murmurs. LUNGS:  Occasional rhonchus heard over lower lung field. ABDOMEN:  No palpable organs or masses. EXTREMITIES:  Free of edema.  ASSESSMENT:  The patient does have recent acute respiratory failure, chronic obstructive pulmonary disease, hypertension, diabetes.  He is being treated for pneumonia as well.  PLAN:  To continue his IV steroids, antibiotics IV Rocephin, Zithromax, Solu-Medrol.  Continue current regimen.     Skylan Gift G. Renard Matter, MD     AGM/MEDQ  D:  06/01/2013  T:  06/01/2013  Job:  [id number]

## 2013-06-02 LAB — GLUCOSE, CAPILLARY
Glucose-Capillary: 156 mg/dL — ABNORMAL HIGH (ref 70–99)
Glucose-Capillary: 164 mg/dL — ABNORMAL HIGH (ref 70–99)
Glucose-Capillary: 167 mg/dL — ABNORMAL HIGH (ref 70–99)
Glucose-Capillary: 179 mg/dL — ABNORMAL HIGH (ref 70–99)
Glucose-Capillary: 235 mg/dL — ABNORMAL HIGH (ref 70–99)

## 2013-06-02 MED ORDER — AMLODIPINE BESYLATE 5 MG PO TABS
5.0000 mg | ORAL_TABLET | Freq: Every day | ORAL | Status: DC
  Administered 2013-06-02 – 2013-06-03 (×2): 5 mg via ORAL
  Filled 2013-06-02 (×2): qty 1

## 2013-06-02 MED ORDER — PREDNISONE 20 MG PO TABS
40.0000 mg | ORAL_TABLET | Freq: Every day | ORAL | Status: DC
  Administered 2013-06-02 – 2013-06-03 (×2): 40 mg via ORAL
  Filled 2013-06-02 (×2): qty 2

## 2013-06-02 MED ORDER — CEPHALEXIN 500 MG PO CAPS
500.0000 mg | ORAL_CAPSULE | Freq: Three times a day (TID) | ORAL | Status: DC
  Administered 2013-06-02 – 2013-06-03 (×4): 500 mg via ORAL
  Filled 2013-06-02 (×4): qty 1

## 2013-06-02 MED ORDER — NICOTINE 21 MG/24HR TD PT24
21.0000 mg | MEDICATED_PATCH | Freq: Every day | TRANSDERMAL | Status: DC
  Administered 2013-06-02: 21 mg via TRANSDERMAL
  Filled 2013-06-02 (×2): qty 1

## 2013-06-02 MED ORDER — LORAZEPAM 0.5 MG PO TABS
0.5000 mg | ORAL_TABLET | ORAL | Status: DC | PRN
  Administered 2013-06-02 (×2): 0.5 mg via ORAL
  Filled 2013-06-02 (×2): qty 1

## 2013-06-02 NOTE — Progress Notes (Signed)
NAME:  Jeff Ray, Jeff Ray                 ACCOUNT NO.:  [id number]  MEDICAL RECORD NO.:  [id number]  LOCATION:  A333                          FACILITY:  [facility]  PHYSICIAN:  Denetria Luevanos G. Renard Matter, MD   DATE OF BIRTH:  January 17, 1956  DATE OF PROCEDURE: DATE OF DISCHARGE:                                PROGRESS NOTE   SUBJECTIVE:  This patient had a fairly good night, just has generalized weakness and some dyspnea.  He is being treated for community-acquired pneumonia and chronic respiratory failure.  OBJECTIVE:  VITAL SIGNS:  Blood pressure 132/67, respirations 20, pulse 82, temp 98.2, O2 saturation 100%. HEENT:  Eyes, PERRLA.  TM, negative.  Oropharynx, benign. NECK:  Supple.  No JVD or thyroid abnormalities. HEART:  Regular rhythm.  No murmurs. LUNGS:  Occasional rhonchus heard over lower lung fields. ABDOMEN:  No palpable organs or masses. EXTREMITIES:  Free of edema.  ASSESSMENT:  The patient does have recent acute respiratory failure, community-acquired pneumonia most likely, diabetes and hypertension.  PLAN:  To continue IV steroids, antibiotics, Rocephin, Zithromax, Solu- Medrol.  Continue current regimen.  The patient is getting to a point now where we could consider discharge.     Saarah Dewing G. Renard Matter, MD     AGM/MEDQ  D:  06/02/2013  T:  06/02/2013  Job:  [id number]

## 2013-06-02 NOTE — Progress Notes (Signed)
Pt requesting something for anxiety. No IV access at this time. Dr. Janna Arch made aware, and ordered ativan 0.5mg  po, Q4HR PRN anxiety. Sheryn Bison

## 2013-06-02 NOTE — Progress Notes (Signed)
Subjective: He says he feels better and wants to go home.  Objective: Vital signs in last 24 hours: Temp:  [98 F (36.7 C)-98.2 F (36.8 C)] 98.2 F (36.8 C) (08/30 0450) Pulse Rate:  [78-97] 82 (08/30 0450) Resp:  [0-27] 20 (08/30 0450) BP: (132-157)/(67-79) 132/67 mmHg (08/30 0450) SpO2:  [94 %-100 %] 100 % (08/30 0736) FiO2 (%):  [35 %] 35 % (08/30 0441) Weight:  [89.812 kg (198 lb)] 89.812 kg (198 lb) (08/30 0450) Weight change: 11.812 kg (26 lb 0.7 oz) Last BM Date: 05/30/13  Intake/Output from previous day: 08/29 0701 - 08/30 0700 In: 620 [P.O.:120; I.V.:500] Out: 550 [Urine:550]  PHYSICAL EXAM General appearance: alert, cooperative and no distress Resp: He has bilateral end expiratory wheezes Cardio: regular rate and rhythm, S1, S2 normal, no murmur, click, rub or gallop GI: soft, non-tender; bowel sounds normal; no masses,  no organomegaly Extremities: extremities normal, atraumatic, no cyanosis or edema  Lab Results:    Basic Metabolic Panel:  Recent Labs  40/98/11 0631  NA 139  K 4.0  CL 100  CO2 36*  GLUCOSE 153*  BUN 20  CREATININE 0.77  CALCIUM 8.8   Liver Function Tests: No results found for this basename: AST, ALT, ALKPHOS, BILITOT, PROT, ALBUMIN,  in the last 72 hours No results found for this basename: LIPASE, AMYLASE,  in the last 72 hours No results found for this basename: AMMONIA,  in the last 72 hours CBC:  Recent Labs  05/31/13 0631  WBC 9.7  NEUTROABS 8.6*  HGB 11.9*  HCT 37.0*  MCV 90.9  PLT 179   Cardiac Enzymes: No results found for this basename: CKTOTAL, CKMB, CKMBINDEX, TROPONINI,  in the last 72 hours BNP: No results found for this basename: PROBNP,  in the last 72 hours D-Dimer: No results found for this basename: DDIMER,  in the last 72 hours CBG:  Recent Labs  06/01/13 1153 06/01/13 1658 06/01/13 2100 06/02/13 0055 06/02/13 0448 06/02/13 0749  GLUCAP 213* 164* 165* 179* 164* 156*   Hemoglobin A1C: No  results found for this basename: HGBA1C,  in the last 72 hours Fasting Lipid Panel: No results found for this basename: CHOL, HDL, LDLCALC, TRIG, CHOLHDL, LDLDIRECT,  in the last 72 hours Thyroid Function Tests: No results found for this basename: TSH, T4TOTAL, FREET4, T3FREE, THYROIDAB,  in the last 72 hours Anemia Panel: No results found for this basename: VITAMINB12, FOLATE, FERRITIN, TIBC, IRON, RETICCTPCT,  in the last 72 hours Coagulation: No results found for this basename: LABPROT, INR,  in the last 72 hours Urine Drug Screen: Drugs of Abuse  No results found for this basename: labopia, cocainscrnur, labbenz, amphetmu, thcu, labbarb    Alcohol Level: No results found for this basename: ETH,  in the last 72 hours Urinalysis: No results found for this basename: COLORURINE, APPERANCEUR, LABSPEC, PHURINE, GLUCOSEU, HGBUR, BILIRUBINUR, KETONESUR, PROTEINUR, UROBILINOGEN, NITRITE, LEUKOCYTESUR,  in the last 72 hours Misc. Labs:  ABGS No results found for this basename: PHART, PCO2, PO2ART, TCO2, HCO3,  in the last 72 hours CULTURES Recent Results (from the past 240 hour(s))  MRSA PCR SCREENING     Status: None   Collection Time    05/27/13 12:35 AM      Result Value Range Status   MRSA by PCR NEGATIVE  NEGATIVE Final   Comment:            The GeneXpert MRSA Assay (FDA     approved for NASAL specimens  only), is one component of a     comprehensive MRSA colonization     surveillance program. It is not     intended to diagnose MRSA     infection nor to guide or     monitor treatment for     MRSA infections.   Studies/Results: No results found.  Medications:  Prior to Admission:  Prescriptions prior to admission  Medication Sig Dispense Refill  . albuterol (PROAIR HFA) 108 (90 BASE) MCG/ACT inhaler Inhale 2 puffs into the lungs every 6 (six) hours as needed. Shortness of Breath      . ALPRAZolam (XANAX) 0.25 MG tablet Take 0.25 mg by mouth 3 (three) times daily as  needed for anxiety.      [name] ibuprofen (ADVIL,MOTRIN) 800 MG tablet Take 800 mg by mouth daily as needed. For pain      . ipratropium-albuterol (DUONEB) 0.5-2.5 (3) MG/3ML SOLN Take 3 mLs by nebulization every 4 (four) hours as needed. For shortness of breath      . metFORMIN (GLUCOPHAGE) 1000 MG tablet Take 1 tablet (1,000 mg total) by mouth 2 (two) times daily.  60 tablet  5  . montelukast (SINGULAIR) 10 MG tablet Take 10 mg by mouth at bedtime.        . predniSONE (DELTASONE) 10 MG tablet Take 20-30 mg by mouth daily.      . rosuvastatin (CRESTOR) 10 MG tablet Take 10 mg by mouth daily.       Scheduled: . albuterol  2.5 mg Nebulization Q4H  . azithromycin  500 mg Oral QHS  . cephALEXin  500 mg Oral Q8H  . feeding supplement  237 mL Oral BID BM  . guaiFENesin  1,200 mg Oral BID  . heparin  5,000 Units Subcutaneous Q8H  . insulin aspart  0-15 Units Subcutaneous Q4H  . ipratropium  0.5 mg Nebulization Q4H  . pantoprazole  40 mg Oral Daily  . predniSONE  40 mg Oral Q breakfast   Continuous: . sodium chloride 125 mL/hr at 06/01/13 2153   ZOX:WRUEAV chloride, albuterol, LORazepam  Assesment: He was admitted with acute on chronic respiratory failure. He has community-acquired pneumonia. He is improving. I don't think he is ready for discharge Active Problems:   Respiratory failure, acute and chronic   COPD exacerbation   HTN (hypertension)   DM (diabetes mellitus)   Tobacco abuse    Plan: He will be switched to oral antibiotics and steroids and see how he does    LOS: 7 days   Frederic Tones L 06/02/2013, 9:30 AM

## 2013-06-03 LAB — GLUCOSE, CAPILLARY

## 2013-06-03 MED ORDER — FLUTICASONE PROPIONATE 50 MCG/ACT NA SUSP
2.0000 | Freq: Every day | NASAL | Status: DC
  Administered 2013-06-03: 2 via NASAL
  Filled 2013-06-03: qty 16

## 2013-06-03 MED ORDER — AMLODIPINE BESYLATE 5 MG PO TABS: 5.0000 mg | ORAL_TABLET | Freq: Every day | ORAL | Status: DC

## 2013-06-03 MED ORDER — CEPHALEXIN 500 MG PO CAPS: 500.0000 mg | ORAL_CAPSULE | Freq: Three times a day (TID) | ORAL | Status: DC

## 2013-06-03 MED ORDER — PANTOPRAZOLE SODIUM 40 MG PO TBEC: 40.0000 mg | DELAYED_RELEASE_TABLET | Freq: Every day | ORAL | Status: DC

## 2013-06-03 MED ORDER — LORATADINE 10 MG PO TABS: 10.0000 mg | ORAL_TABLET | Freq: Every day | ORAL | Status: DC

## 2013-06-03 MED ORDER — LORATADINE 10 MG PO TABS
10.0000 mg | ORAL_TABLET | Freq: Every day | ORAL | Status: DC
  Administered 2013-06-03: 10 mg via ORAL
  Filled 2013-06-03: qty 1

## 2013-06-03 NOTE — Discharge Summary (Signed)
NAME:  Jeff Ray, Jeff Ray                 ACCOUNT NO.:  [id number]  MEDICAL RECORD NO.:  [id number]  LOCATION:  A333                          FACILITY:  [facility]  PHYSICIAN:  Tucker Minter G. Renard Matter, MD   DATE OF BIRTH:  [birth year]  DATE OF ADMISSION:  05/26/2013 DATE OF DISCHARGE:  08/31/2014LH                              DISCHARGE SUMMARY   DIAGNOSES: 1. Acute on chronic respiratory failure, requiring intubation. 2. Community-acquired pneumonia. 3. Severe chronic obstructive pulmonary disease. 4. Diabetes mellitus type 2. 5. Essential hypertension. 6. Bronchial asthma.  CONDITION:  Stable at the time of his discharge.  HISTORY OF PRESENT ILLNESS:  This patient presented to the emergency room with extreme lethargy and respiratory distress.  He does have severe COPD, was unresponsive at the time of his admission and had to be urgently intubated, does have a history of hypertension, diabetes, and asthma, is a cigarette smoker.  PHYSICAL EXAMINATION:  VITAL SIGNS:  Blood pressure 142/81, respirations 18, temp 98. GENERAL APPEARANCE:  The patient is noted to be in respiratory distress. HEENT:  Eyes PERRLA.  TM negative.  Oropharynx benign. NECK:  Supple.  No JVD or thyroid abnormalities. HEART:  Regular rhythm.  No murmurs. LUNGS:  Prolonged expiration, wheezing throughout both lung fields. ABDOMEN:  No palpable organs or masses. SKIN:  Warm and dry.  LABORATORY DATA:  CBC on admission, WBC is 6.3, hemoglobin 13.5, hematocrit 44.8, MCV 97.6, PLT 288.  ProBNP 235.9.  Basic metabolic panel, sodium 139, potassium 4, chloride 100, CO2 is 36, glucose 153, BUN 20, creatinine 0.77, calcium 8.8.  Blood gases on admission pH 7.341 with a pCO2 of 62.7, pO2 of 76.2, bicarb 33.  Comprehensive metabolic panel done on May 27, 2013, sodium 135, potassium 4.2, chloride 94, CO2 of 37, glucose 180, BUN 20, creatinine 1.46, calcium 9.8, total protein 6.6, albumin 3.0, total bilirubin 0.2.  Blood gas on  May 29, 2013, pH 7.341 with pCO2 of 62.7, pO2 is 76.2, bicarb 33.0.  Glucose is ranged from 119 to 227.  RADIOLOGY:  On admission, chest x-ray stable, COPD, emphysema, old granulomatous disease, no acute cardiopulmonary disease noted. Endotracheal tube tip in satisfactory position, 4-cm above the carina, nasogastric tube in stomach.  EKG on admission showed normal sinus rhythm, a right bundle-branch block.  No acute ST or T-wave changes.  HOSPITAL COURSE:  The patient initially was intubated and placed in the ICU.  He remained in the ICU 6 days on respirator.  Throughout this period of time, he did receive Proventil 0.5% nebulizer treatments.  He was seen in consultation by Pulmonology, Dr. Juanetta Gosling.  He was maintained on IV Rocephin, Zithromax 500 mg, and nebulizer treatments p.r.n.  He also received Solu-Medrol 125 mg every 6 hours, IV Protonix 40 mg at bedtime, and subcutaneous heparin 5000 units every 8 hours, also Zithromax intravenously was continued every 24 hours, 500 mg.  his diabetes was controlled with p.r.n. insulin aspart.  The patient slowly improved on this regimen and was extubated on June 01, 2013, and at that point, he was placed on BiPAP, although feeling weak, he was some better.  His medications were transitioned to p.o. medications. Zithromax 500  mg at bedtime, Norvasc 5 mg daily, Keflex 500 mg every 8 hours, Flonase 50 mcg nasal spray, 2 sprays daily, Mucinex 1200 mg b.i.d., Claritin 10 mg daily was added, Protonix 40 mg daily was continued, prednisone 40 mg with breakfast continued, and nebulizer treatments every 2 hours p.r.n. was continued, Proventil neb treatments 2.5 mg every 4 hours was continued.  The patient was maintained on modified carbohydrate diet.  He was able to sit up some on a bed and chair.  Towards the latter part of hospitalization, it was felt that he could be discharged to home care.     Iris Tatsch G. Renard Matter, MD     AGM/MEDQ  D:   06/03/2013  T:  06/03/2013  Job:  [id number]

## 2013-06-03 NOTE — Progress Notes (Signed)
Dr. Renard Matter was notified of the patients c/o his allergies giving him trouble.  He stated that he was "stopped up" and it was hard for him to breathe.  New orders given and followed.

## 2013-06-03 NOTE — Progress Notes (Signed)
Subjective: He says he wants to go home. However he has not been walking except very short distances.  Objective: Vital signs in last 24 hours: Temp:  [97.8 F (36.6 C)-98.2 F (36.8 C)] 97.8 F (36.6 C) (08/31 0536) Pulse Rate:  [70-95] 70 (08/31 0536) Resp:  [20-24] 24 (08/31 0536) BP: (137-164)/(66-91) 137/66 mmHg (08/31 0536) SpO2:  [97 %-100 %] 100 % (08/31 0811) FiO2 (%):  [35 %] 35 % (08/31 0425) Weight:  [91.899 kg (202 lb 9.6 oz)] 91.899 kg (202 lb 9.6 oz) (08/31 0409) Weight change: 2.087 kg (4 lb 9.6 oz) Last BM Date: 05/30/13  Intake/Output from previous day: 08/30 0701 - 08/31 0700 In: 720 [P.O.:720] Out: 1750 [Urine:1750]  PHYSICAL EXAM General appearance: alert, cooperative and mild distress Resp: diminished breath sounds bilaterally Cardio: regular rate and rhythm, S1, S2 normal, no murmur, click, rub or gallop GI: soft, non-tender; bowel sounds normal; no masses,  no organomegaly Extremities: extremities normal, atraumatic, no cyanosis or edema  Lab Results:    Basic Metabolic Panel: No results found for this basename: NA, K, CL, CO2, GLUCOSE, BUN, CREATININE, CALCIUM, MG, PHOS,  in the last 72 hours Liver Function Tests: No results found for this basename: AST, ALT, ALKPHOS, BILITOT, PROT, ALBUMIN,  in the last 72 hours No results found for this basename: LIPASE, AMYLASE,  in the last 72 hours No results found for this basename: AMMONIA,  in the last 72 hours CBC: No results found for this basename: WBC, NEUTROABS, HGB, HCT, MCV, PLT,  in the last 72 hours Cardiac Enzymes: No results found for this basename: CKTOTAL, CKMB, CKMBINDEX, TROPONINI,  in the last 72 hours BNP: No results found for this basename: PROBNP,  in the last 72 hours D-Dimer: No results found for this basename: DDIMER,  in the last 72 hours CBG:  Recent Labs  06/02/13 1129 06/02/13 1643 06/02/13 2013 06/02/13 2315 06/03/13 0320 06/03/13 0739  GLUCAP 237* 242* 235* 167*  107* 99   Hemoglobin A1C: No results found for this basename: HGBA1C,  in the last 72 hours Fasting Lipid Panel: No results found for this basename: CHOL, HDL, LDLCALC, TRIG, CHOLHDL, LDLDIRECT,  in the last 72 hours Thyroid Function Tests: No results found for this basename: TSH, T4TOTAL, FREET4, T3FREE, THYROIDAB,  in the last 72 hours Anemia Panel: No results found for this basename: VITAMINB12, FOLATE, FERRITIN, TIBC, IRON, RETICCTPCT,  in the last 72 hours Coagulation: No results found for this basename: LABPROT, INR,  in the last 72 hours Urine Drug Screen: Drugs of Abuse  No results found for this basename: labopia, cocainscrnur, labbenz, amphetmu, thcu, labbarb    Alcohol Level: No results found for this basename: ETH,  in the last 72 hours Urinalysis: No results found for this basename: COLORURINE, APPERANCEUR, LABSPEC, PHURINE, GLUCOSEU, HGBUR, BILIRUBINUR, KETONESUR, PROTEINUR, UROBILINOGEN, NITRITE, LEUKOCYTESUR,  in the last 72 hours Misc. Labs:  ABGS No results found for this basename: PHART, PCO2, PO2ART, TCO2, HCO3,  in the last 72 hours CULTURES Recent Results (from the past 240 hour(s))  MRSA PCR SCREENING     Status: None   Collection Time    05/27/13 12:35 AM      Result Value Range Status   MRSA by PCR NEGATIVE  NEGATIVE Final   Comment:            The GeneXpert MRSA Assay (FDA     approved for NASAL specimens     only), is one component of a  comprehensive MRSA colonization     surveillance program. It is not     intended to diagnose MRSA     infection nor to guide or     monitor treatment for     MRSA infections.   Studies/Results: No results found.  Medications:  Scheduled: . albuterol  2.5 mg Nebulization Q4H  . amLODipine  5 mg Oral Daily  . azithromycin  500 mg Oral QHS  . cephALEXin  500 mg Oral Q8H  . feeding supplement  237 mL Oral BID BM  . fluticasone  2 spray Each Nare Daily  . guaiFENesin  1,200 mg Oral BID  . heparin  5,000  Units Subcutaneous Q8H  . insulin aspart  0-15 Units Subcutaneous Q4H  . ipratropium  0.5 mg Nebulization Q4H  . loratadine  10 mg Oral Daily  . nicotine  21 mg Transdermal Daily  . pantoprazole  40 mg Oral Daily  . predniSONE  40 mg Oral Q breakfast   Continuous:  [id number]:[id number], LORazepam  Assesment: He was admitted with acute on chronic respiratory failure with community-acquired pneumonia. He was switched to oral medications yesterday. He seems to be doing okay but he has not been up Active Problems:   Respiratory failure, acute and chronic   COPD exacerbation   HTN (hypertension)   DM (diabetes mellitus)   Tobacco abuse   CAP (community acquired pneumonia)    Plan: I think he needs to ambulate some before we make a decision about discharge    LOS: 8 days   Masayoshi Couzens L 06/03/2013, 9:21 AM

## 2013-06-03 NOTE — Discharge Summary (Signed)
NAME:  Jeff Ray, Jeff Ray                 ACCOUNT NO.:  [id number]  MEDICAL RECORD NO.:  [id number]  LOCATION:  A333                          FACILITY:  [facility]  PHYSICIAN:  Dezman Granda G. Renard Matter, MD   DATE OF BIRTH:  [birth year]  DATE OF ADMISSION:  05/26/2013 DATE OF DISCHARGE:  08/31/2014LH                              DISCHARGE SUMMARY   ADDENDUM:  The patient will be taking the following medications at home: 1. Protonix 40 mg daily. 2. Keflex 500 mg every 8 hours. 3. Amlodipine 5 mg daily. 4. Claritin 10 mg daily.  He is to resume the following medications: 1. Singulair 10 mg daily. 2. ProAir HFA 2 puffs every 6 hours as needed. 3. Ibuprofen 800 mg daily as needed for pain. 4. Metformin 1000 mg b.i.d. 5. DuoNeb treatments 3 mL by nebulization every 4 hours as needed. 6. Crestor 10 mg daily. 7. Xanax 0.25 mg 3 times a day as needed for anxiety. 8. Prednisone 20-30 mg by mouth daily.     Glenola Wheat G. Renard Matter, MD     AGM/MEDQ  D:  06/03/2013  T:  06/03/2013  Job:  [id number]

## 2013-06-04 NOTE — Progress Notes (Signed)
UR chart review completed.  

## 2013-06-14 NOTE — Progress Notes (Signed)
NAME:  Jeff Ray, Jeff Ray                 ACCOUNT NO.:  [id number]  MEDICAL RECORD NO.:  [id number]  LOCATION:  A333                          FACILITY:  [facility]  PHYSICIAN:  Keone Kamer G. Renard Matter, MD   DATE OF BIRTH:  Jul 28, 1956  DATE OF PROCEDURE: DATE OF DISCHARGE:  06/03/2013                                PROGRESS NOTE   ADDENDUM:  DIAGNOSIS:  Respiratory acidosis.     Theran Vandergrift G. Renard Matter, MD     AGM/MEDQ  D:  06/13/2013  T:  06/13/2013  Job:  [id number]

## 2014-05-30 ENCOUNTER — Other Ambulatory Visit (HOSPITAL_COMMUNITY): Payer: Self-pay | Admitting: Family Medicine

## 2014-05-30 ENCOUNTER — Ambulatory Visit (HOSPITAL_COMMUNITY)
Admission: RE | Admit: 2014-05-30 | Discharge: 2014-05-30 | Disposition: A | Discharge status: Home / Self Care | Payer: Medicare HMO | Source: Ambulatory Visit | Attending: Family Medicine | Admitting: Family Medicine

## 2014-05-30 DIAGNOSIS — R059 Cough, unspecified: Secondary | ICD-10-CM | POA: Insufficient documentation

## 2014-05-30 DIAGNOSIS — M545 Low back pain, unspecified: Secondary | ICD-10-CM | POA: Insufficient documentation

## 2014-05-30 DIAGNOSIS — R0989 Other specified symptoms and signs involving the circulatory and respiratory systems: Secondary | ICD-10-CM | POA: Insufficient documentation

## 2014-05-30 DIAGNOSIS — R05 Cough: Secondary | ICD-10-CM | POA: Diagnosis not present

## 2014-05-30 DIAGNOSIS — I7090 Unspecified atherosclerosis: Secondary | ICD-10-CM | POA: Insufficient documentation

## 2014-05-30 DIAGNOSIS — R918 Other nonspecific abnormal finding of lung field: Secondary | ICD-10-CM | POA: Diagnosis not present

## 2015-05-19 ENCOUNTER — Ambulatory Visit (HOSPITAL_COMMUNITY)
Admission: RE | Admit: 2015-05-19 | Discharge: 2015-05-19 | Disposition: A | Discharge status: Home / Self Care | Payer: Medicare HMO | Source: Ambulatory Visit | Attending: Family Medicine | Admitting: Family Medicine

## 2015-05-19 ENCOUNTER — Other Ambulatory Visit (HOSPITAL_COMMUNITY): Payer: Self-pay | Admitting: Family Medicine

## 2015-05-19 DIAGNOSIS — J449 Chronic obstructive pulmonary disease, unspecified: Secondary | ICD-10-CM | POA: Diagnosis not present

## 2015-05-19 DIAGNOSIS — R0989 Other specified symptoms and signs involving the circulatory and respiratory systems: Secondary | ICD-10-CM | POA: Insufficient documentation

## 2015-07-10 ENCOUNTER — Other Ambulatory Visit (HOSPITAL_COMMUNITY): Payer: Self-pay | Admitting: Family Medicine

## 2015-07-10 ENCOUNTER — Ambulatory Visit (HOSPITAL_COMMUNITY)
Admission: RE | Admit: 2015-07-10 | Discharge: 2015-07-10 | Disposition: A | Discharge status: Home / Self Care | Payer: Medicare HMO | Source: Ambulatory Visit | Attending: Family Medicine | Admitting: Family Medicine

## 2015-07-10 DIAGNOSIS — J449 Chronic obstructive pulmonary disease, unspecified: Secondary | ICD-10-CM | POA: Insufficient documentation

## 2015-07-10 DIAGNOSIS — R059 Cough, unspecified: Secondary | ICD-10-CM

## 2015-07-10 DIAGNOSIS — R0602 Shortness of breath: Secondary | ICD-10-CM | POA: Insufficient documentation

## 2015-07-10 DIAGNOSIS — R05 Cough: Secondary | ICD-10-CM

## 2015-07-10 DIAGNOSIS — R509 Fever, unspecified: Secondary | ICD-10-CM | POA: Diagnosis not present

## 2015-08-03 ENCOUNTER — Encounter (HOSPITAL_COMMUNITY): Payer: Self-pay | Admitting: Emergency Medicine

## 2015-08-03 ENCOUNTER — Emergency Department (HOSPITAL_COMMUNITY): Payer: Medicare HMO

## 2015-08-03 ENCOUNTER — Inpatient Hospital Stay (HOSPITAL_COMMUNITY)
Admission: EM | Admit: 2015-08-03 | Discharge: 2015-08-06 | DRG: 190 | Disposition: A | Discharge status: Home / Self Care | Payer: Medicare HMO | Attending: Family Medicine | Admitting: Family Medicine

## 2015-08-03 DIAGNOSIS — J9621 Acute and chronic respiratory failure with hypoxia: Secondary | ICD-10-CM

## 2015-08-03 DIAGNOSIS — I10 Essential (primary) hypertension: Secondary | ICD-10-CM | POA: Diagnosis present

## 2015-08-03 DIAGNOSIS — J45909 Unspecified asthma, uncomplicated: Secondary | ICD-10-CM | POA: Diagnosis present

## 2015-08-03 DIAGNOSIS — Z7984 Long term (current) use of oral hypoglycemic drugs: Secondary | ICD-10-CM

## 2015-08-03 DIAGNOSIS — J9622 Acute and chronic respiratory failure with hypercapnia: Secondary | ICD-10-CM | POA: Diagnosis present

## 2015-08-03 DIAGNOSIS — R0602 Shortness of breath: Secondary | ICD-10-CM | POA: Diagnosis present

## 2015-08-03 DIAGNOSIS — Z87891 Personal history of nicotine dependence: Secondary | ICD-10-CM | POA: Diagnosis not present

## 2015-08-03 DIAGNOSIS — Z9981 Dependence on supplemental oxygen: Secondary | ICD-10-CM | POA: Diagnosis not present

## 2015-08-03 DIAGNOSIS — E119 Type 2 diabetes mellitus without complications: Secondary | ICD-10-CM | POA: Diagnosis present

## 2015-08-03 DIAGNOSIS — K047 Periapical abscess without sinus: Secondary | ICD-10-CM | POA: Diagnosis present

## 2015-08-03 DIAGNOSIS — D649 Anemia, unspecified: Secondary | ICD-10-CM | POA: Diagnosis present

## 2015-08-03 DIAGNOSIS — E1169 Type 2 diabetes mellitus with other specified complication: Secondary | ICD-10-CM | POA: Diagnosis not present

## 2015-08-03 DIAGNOSIS — J441 Chronic obstructive pulmonary disease with (acute) exacerbation: Secondary | ICD-10-CM | POA: Diagnosis present

## 2015-08-03 DIAGNOSIS — J9602 Acute respiratory failure with hypercapnia: Secondary | ICD-10-CM

## 2015-08-03 LAB — BASIC METABOLIC PANEL
Anion gap: 7 (ref 5–15)
BUN: 16 mg/dL (ref 6–20)
CALCIUM: 9.3 mg/dL (ref 8.9–10.3)
CO2: 37 mmol/L — ABNORMAL HIGH (ref 22–32)
CREATININE: 1.08 mg/dL (ref 0.61–1.24)
Chloride: 92 mmol/L — ABNORMAL LOW (ref 101–111)
GFR calc Af Amer: >60 mL/min (ref >60)
GLUCOSE: 149 mg/dL — AB (ref 65–99)
POTASSIUM: 3.7 mmol/L (ref 3.5–5.1)
SODIUM: 136 mmol/L (ref 135–145)

## 2015-08-03 LAB — RETICULOCYTES
RBC.: 3.92 MIL/uL — ABNORMAL LOW (ref 4.22–5.81)
RETIC COUNT ABSOLUTE: 47 10*3/uL (ref 19.0–186.0)
Retic Ct Pct: 1.2 % (ref 0.4–3.1)

## 2015-08-03 LAB — CBC
HEMATOCRIT: 37.2 % — AB (ref 39.0–52.0)
Hemoglobin: 11.6 g/dL — ABNORMAL LOW (ref 13.0–17.0)
MCH: 29.6 pg (ref 26.0–34.0)
MCHC: 31.2 g/dL (ref 30.0–36.0)
MCV: 94.9 fL (ref 78.0–100.0)
PLATELETS: 218 10*3/uL (ref 150–400)
RBC: 3.92 MIL/uL — ABNORMAL LOW (ref 4.22–5.81)
RDW: 12.4 % (ref 11.5–15.5)
WBC: 5.6 10*3/uL (ref 4.0–10.5)

## 2015-08-03 LAB — BLOOD GAS, ARTERIAL
ACID-BASE EXCESS: 13.3 mmol/L — AB (ref 0.0–2.0)
BICARBONATE: 35.2 meq/L — AB (ref 20.0–24.0)
Drawn by: 22179
O2 Content: 2.5 L/min
O2 Saturation: 98.1 %
PATIENT TEMPERATURE: 37
PCO2 ART: 74.9 mmHg — AB (ref 35.0–45.0)
PO2 ART: 117 mmHg — AB (ref 80.0–100.0)
TCO2: 15.4 mmol/L (ref 0–100)
pH, Arterial: 7.341 — ABNORMAL LOW (ref 7.350–7.450)

## 2015-08-03 LAB — TROPONIN I: Troponin I: 0.1 ng/mL — ABNORMAL HIGH (ref <0.031)

## 2015-08-03 LAB — BRAIN NATRIURETIC PEPTIDE: B Natriuretic Peptide: 15 pg/mL (ref 0.0–100.0)

## 2015-08-03 MED ORDER — OXYCODONE HCL 5 MG PO TABS
5.0000 mg | ORAL_TABLET | ORAL | Status: DC | PRN
  Administered 2015-08-04 – 2015-08-05 (×3): 5 mg via ORAL
  Filled 2015-08-03 (×3): qty 1

## 2015-08-03 MED ORDER — ACETAMINOPHEN 325 MG PO TABS: 650.0000 mg | ORAL_TABLET | Freq: Four times a day (QID) | ORAL | Status: DC | PRN

## 2015-08-03 MED ORDER — PANTOPRAZOLE SODIUM 40 MG IV SOLR
40.0000 mg | INTRAVENOUS | Status: DC
  Administered 2015-08-03 – 2015-08-05 (×3): 40 mg via INTRAVENOUS
  Filled 2015-08-03 (×4): qty 40

## 2015-08-03 MED ORDER — IPRATROPIUM-ALBUTEROL 0.5-2.5 (3) MG/3ML IN SOLN
3.0000 mL | Freq: Once | RESPIRATORY_TRACT | Status: AC
Start: 2015-08-03 — End: 2015-08-03
  Administered 2015-08-03: 3 mL via RESPIRATORY_TRACT

## 2015-08-03 MED ORDER — ACETAMINOPHEN 650 MG RE SUPP: 650.0000 mg | Freq: Four times a day (QID) | RECTAL | Status: DC | PRN

## 2015-08-03 MED ORDER — IPRATROPIUM-ALBUTEROL 0.5-2.5 (3) MG/3ML IN SOLN
3.0000 mL | RESPIRATORY_TRACT | Status: DC
  Administered 2015-08-03 – 2015-08-04 (×3): 3 mL via RESPIRATORY_TRACT
  Filled 2015-08-03 (×3): qty 3

## 2015-08-03 MED ORDER — ONDANSETRON HCL 4 MG/2ML IJ SOLN: 4.0000 mg | Freq: Four times a day (QID) | INTRAMUSCULAR | Status: DC | PRN

## 2015-08-03 MED ORDER — METHYLPREDNISOLONE SODIUM SUCC 125 MG IJ SOLR
125.0000 mg | Freq: Once | INTRAMUSCULAR | Status: DC
Start: 2015-08-03 — End: 2015-08-03

## 2015-08-03 MED ORDER — LEVOFLOXACIN IN D5W 750 MG/150ML IV SOLN
750.0000 mg | INTRAVENOUS | Status: DC
  Administered 2015-08-03 – 2015-08-05 (×4): 750 mg via INTRAVENOUS
  Filled 2015-08-03 (×4): qty 150

## 2015-08-03 MED ORDER — METHYLPREDNISOLONE SODIUM SUCC 125 MG IJ SOLR
125.0000 mg | Freq: Once | INTRAMUSCULAR | Status: AC
  Administered 2015-08-03: 125 mg via INTRAVENOUS
  Filled 2015-08-03: qty 2

## 2015-08-03 MED ORDER — INSULIN ASPART 100 UNIT/ML ~~LOC~~ SOLN
0.0000 [IU] | SUBCUTANEOUS | Status: DC
  Administered 2015-08-04: 2 [IU] via SUBCUTANEOUS
  Administered 2015-08-04: 3 [IU] via SUBCUTANEOUS
  Administered 2015-08-04: 2 [IU] via SUBCUTANEOUS

## 2015-08-03 MED ORDER — LORAZEPAM 2 MG/ML IJ SOLN
0.5000 mg | Freq: Four times a day (QID) | INTRAMUSCULAR | Status: DC | PRN
  Administered 2015-08-03 – 2015-08-05 (×3): 0.5 mg via INTRAVENOUS
  Filled 2015-08-03 (×3): qty 1

## 2015-08-03 MED ORDER — ALBUTEROL SULFATE (2.5 MG/3ML) 0.083% IN NEBU
2.5000 mg | INHALATION_SOLUTION | Freq: Once | RESPIRATORY_TRACT | Status: AC
  Administered 2015-08-03: 2.5 mg via RESPIRATORY_TRACT
  Filled 2015-08-03: qty 3

## 2015-08-03 MED ORDER — HYDROMORPHONE HCL 1 MG/ML IJ SOLN
0.5000 mg | INTRAMUSCULAR | Status: DC | PRN
  Administered 2015-08-03: 1 mg via INTRAVENOUS
  Filled 2015-08-03: qty 1

## 2015-08-03 MED ORDER — ALBUTEROL SULFATE (2.5 MG/3ML) 0.083% IN NEBU
5.0000 mg | INHALATION_SOLUTION | Freq: Once | RESPIRATORY_TRACT | Status: AC
  Administered 2015-08-03: 5 mg via RESPIRATORY_TRACT
  Filled 2015-08-03: qty 6

## 2015-08-03 MED ORDER — METHYLPREDNISOLONE SODIUM SUCC 125 MG IJ SOLR
125.0000 mg | Freq: Once | INTRAMUSCULAR | Status: AC
  Administered 2015-08-04: 125 mg via INTRAVENOUS
  Filled 2015-08-03: qty 2

## 2015-08-03 MED ORDER — CETYLPYRIDINIUM CHLORIDE 0.05 % MT LIQD
7.0000 mL | Freq: Two times a day (BID) | OROMUCOSAL | Status: DC
  Administered 2015-08-03 – 2015-08-05 (×5): 7 mL via OROMUCOSAL

## 2015-08-03 MED ORDER — IPRATROPIUM-ALBUTEROL 0.5-2.5 (3) MG/3ML IN SOLN
RESPIRATORY_TRACT | Status: AC
  Filled 2015-08-03: qty 3

## 2015-08-03 MED ORDER — ONDANSETRON HCL 4 MG PO TABS: 4.0000 mg | ORAL_TABLET | Freq: Four times a day (QID) | ORAL | Status: DC | PRN

## 2015-08-03 MED ORDER — HYDRALAZINE HCL 20 MG/ML IJ SOLN: 5.0000 mg | Freq: Four times a day (QID) | INTRAMUSCULAR | Status: DC | PRN

## 2015-08-03 MED ORDER — ENOXAPARIN SODIUM 40 MG/0.4ML ~~LOC~~ SOLN
40.0000 mg | SUBCUTANEOUS | Status: DC
  Administered 2015-08-03 – 2015-08-05 (×3): 40 mg via SUBCUTANEOUS
  Filled 2015-08-03 (×3): qty 0.4

## 2015-08-03 MED ORDER — SODIUM CHLORIDE 0.9 % IV SOLN
INTRAVENOUS | Status: DC
  Administered 2015-08-03 – 2015-08-06 (×4): via INTRAVENOUS

## 2015-08-03 NOTE — ED Provider Notes (Signed)
CSN: [id number]     Arrival date & time 08/03/15  1639 History   First MD Initiated Contact with Patient 08/03/15 1718     Chief Complaint  Patient presents with  . Shortness of Breath      HPI  Patient presents evaluation of difficulty breathing. History shortness of breath for the last 2 days with some thin yellow sputum. Typically on 2 L of oxygen at home more acutely short of breath the last 24 hours. Arrives with respiratory distress, tripoding, diaphoretic, distressed.  Denies chest pain. Denies fever. Denies swelling. No abdominal symptoms.  Past Medical History  Diagnosis Date  . COPD (chronic obstructive pulmonary disease) (HCC)   . Asthma   . Hypertension   . Anxiety   . Diabetes mellitus    Past Surgical History  Procedure Laterality Date  . Back surgery     History reviewed. No pertinent family history. Social History  Substance Use Topics  . Smoking status: Former Smoker -- 0.00 packs/day for 10 years    Types: Cigarettes  . Smokeless tobacco: None  . Alcohol Use: No    Review of Systems  Constitutional: Negative for fever, chills, diaphoresis, appetite change and fatigue.  HENT: Negative for mouth sores, sore throat and trouble swallowing.   Eyes: Negative for visual disturbance.  Respiratory: Positive for cough, shortness of breath and wheezing. Negative for chest tightness.   Cardiovascular: Negative for chest pain.  Gastrointestinal: Negative for nausea, vomiting, abdominal pain, diarrhea and abdominal distention.  Endocrine: Negative for polydipsia, polyphagia and polyuria.  Genitourinary: Negative for dysuria, frequency and hematuria.  Musculoskeletal: Negative for gait problem.  Skin: Negative for color change, pallor and rash.  Neurological: Negative for dizziness, syncope, light-headedness and headaches.  Hematological: Does not bruise/bleed easily.  Psychiatric/Behavioral: Negative for behavioral problems and confusion.      Allergies   Review of patient's allergies indicates no known allergies.  Home Medications   Prior to Admission medications   Medication Sig Start Date End Date Taking? Authorizing Provider  albuterol (PROAIR HFA) 108 (90 BASE) MCG/ACT inhaler Inhale 2 puffs into the lungs every 6 (six) hours as needed. Shortness of Breath   Yes Historical Provider, MD  ALPRAZolam (XANAX) 0.5 MG tablet Take 0.5 mg by mouth 3 (three) times daily as needed for anxiety.   Yes Historical Provider, MD  fexofenadine-pseudoephedrine (ALLEGRA-D 24) 180-240 MG 24 hr tablet Take 1 tablet by mouth daily.   Yes Historical Provider, MD  Guaifenesin (MUCINEX MAXIMUM STRENGTH) 1200 MG TB12 Take 1 tablet by mouth 2 (two) times daily.   Yes Historical Provider, MD  ibuprofen (ADVIL,MOTRIN) 800 MG tablet Take 800 mg by mouth daily as needed. For pain   Yes Historical Provider, MD  ipratropium-albuterol (DUONEB) 0.5-2.5 (3) MG/3ML SOLN Take 3 mLs by nebulization every 4 (four) hours as needed. For shortness of breath   Yes Historical Provider, MD  metFORMIN (GLUCOPHAGE) 1000 MG tablet Take 1 tablet (1,000 mg total) by mouth 2 (two) times daily. 06/08/11  Yes Angus McInnis, MD  montelukast (SINGULAIR) 10 MG tablet Take 10 mg by mouth at bedtime.     Yes Historical Provider, MD   BP 141/78 mmHg  Pulse 106  Temp(Src) 97.7 F (36.5 C) (Oral)  Resp 21  Ht  (1.854 m)  Wt 170 lb (77.111 kg)  BMI 22.43 kg/m2  SpO2 100% Physical Exam  Constitutional: He is oriented to person, place, and time. He appears well-developed and well-nourished. He appears distressed.  Appears distress. Anxious. Tripod position.  HENT:  Head: Normocephalic.  Eyes: Conjunctivae are normal. Pupils are equal, round, and reactive to light. No scleral icterus.  Neck: Normal range of motion. Neck supple. No thyromegaly present.  Cardiovascular: Normal rate and regular rhythm.  Exam reveals no gallop and no friction rub.   No murmur heard. Pulmonary/Chest: Effort  normal. No respiratory distress. He has decreased breath sounds in the right upper field, the right middle field, the right lower field, the left upper field, the left middle field and the left lower field. He has wheezes in the right upper field, the right middle field, the right lower field, the left upper field, the left middle field and the left lower field. He has no rales.  We diminished breath sounds. Prolongation. Minimal air movement.  Abdominal: Soft. Bowel sounds are normal. He exhibits no distension. There is no tenderness. There is no rebound.  Musculoskeletal: Normal range of motion.  Neurological: He is alert and oriented to person, place, and time.  Skin: Skin is warm and dry. No rash noted.  No dependent edema  Psychiatric: He has a normal mood and affect. His behavior is normal.    ED Course  Procedures (including critical care time) Labs Review Labs Reviewed  BASIC METABOLIC PANEL - Abnormal; Notable for the following:    Chloride 92 (*)    CO2 37 (*)    Glucose, Bld 149 (*)    All other components within normal limits  CBC - Abnormal; Notable for the following:    RBC 3.92 (*)    Hemoglobin 11.6 (*)    HCT 37.2 (*)    All other components within normal limits  BLOOD GAS, ARTERIAL - Abnormal; Notable for the following:    pH, Arterial 7.341 (*)    pCO2 arterial 74.9 (*)    pO2, Arterial 117 (*)    Bicarbonate 35.2 (*)    Acid-Base Excess 13.3 (*)    All other components within normal limits  TROPONIN I - Abnormal; Notable for the following:    Troponin I 0.10 (*)    All other components within normal limits  RETICULOCYTES - Abnormal; Notable for the following:    RBC. 3.92 (*)    All other components within normal limits  BRAIN NATRIURETIC PEPTIDE  VITAMIN B12  FOLATE  IRON AND TIBC  FERRITIN  OCCULT BLOOD X 1 CARD TO LAB, STOOL  HEMOGLOBIN A1C    Imaging Review Dg Chest Port 1 View  08/03/2015  CLINICAL DATA:  Worsening shortness of breath with  exertion over the last 2 days. Productive cough. Ex-smoker with history of hypertension, diabetes and COPD. Initial encounter. EXAM: PORTABLE CHEST 1 VIEW COMPARISON:  07/10/2015 and 05/19/2015. FINDINGS: The heart size and mediastinal contours are stable. The lungs remain markedly hyperinflated. Increased markings at the lung bases, similar to the most recent study. No confluent airspace opacity, edema, pleural effusion or pneumothorax demonstrated. The bones appear unremarkable. IMPRESSION: Similar appearance of chronic obstructive lung disease and interstitial prominence at the lung bases. No confluent airspace opacity. Electronically Signed   By: Carey BullocksWilliam  Veazey M.D.   On: 08/03/2015 18:38   I have personally reviewed and evaluated these images and lab results as part of my medical decision-making.   EKG Interpretation None      MDM   Final diagnoses:  Acute respiratory failure with hypercapnia (HCC)  COPD exacerbation (HCC)     Patient placed on continuous nebulized albuterol. Does improve while on  this. Given IV site Medrol. Chest x-ray shows no acute processes. EKG shows sinus tachycardia.  ABG shows acute hypercapnic rest or failure with pH 7.31 PCO2 74.  Waist on BiPAP. Has still continued to show improvement. Care discussed with Dr. Manuela Schwartz of [facility]. She is here planning admission to the stepdown unit. Patient has shown marked improvement in emergency room. Does not require airway intervention at this time or invasive positive pressure ventilation.  CRITICAL CARE Performed by: Rolland Porter JOSEPH  Total critical care time: 60 minutes  Critical care time was exclusive of separately billable procedures and treating other patients.  Critical care was necessary to treat or prevent imminent or life-threatening deterioration.  Critical care was time spent personally by me on the following activities: development of treatment plan with patient and/or surrogate  as well as nursing, discussions with consultants, evaluation of patient's response to treatment, examination of patient, obtaining history from patient or surrogate, ordering and performing treatments and interventions, ordering and review of laboratory studies, ordering and review of radiographic studies, pulse oximetry and re-evaluation of patient's condition.     Rolland Porter, MD 08/03/15 [phone]

## 2015-08-03 NOTE — ED Notes (Signed)
Panic Values from ABG given to Dr. Fayrene FearingJames.

## 2015-08-03 NOTE — ED Notes (Signed)
Notified pt's wife Corrie DandyMary of pt's room number he was being transferred to.

## 2015-08-03 NOTE — H&P (Signed)
[facility] Admission History and Physical       DUSHAUN OKEY BJY:[id number] DOB: May 10, 1956 DOA: 08/03/2015  Referring physician: EDP PCP: Alice Reichert, MD  Specialists:   Chief Complaint: SOB , Cough and Wheezing  HPI: Jeff Ray is a 59 y.o. male with history of COPD/Asthma, HTN, DM2 whop presents to the ED with complaints of worsenign SOB, Chest Tightness, Cough and wheezing x 3 days.  He denies any fevers or chills and reports a productive cough with yellow mucus.  He was found to have hypoxia and hypercarbia in the Ed and was placed on BiPAP.   He was administered IV Solumedrol, and duonebs and had mild improvement.  He was referred for admission  Review of Systems:  Constitutional: No Weight Loss, No Weight Gain, Night Sweats, Fevers, Chills, Dizziness, Light Headedness, Fatigue, or Generalized Weakness HEENT: No Headaches, Difficulty Swallowing,Tooth/Dental Problems,Sore Throat,  No Sneezing, Rhinitis, Ear Ache, Nasal Congestion, or Post Nasal Drip,  Cardio-vascular:  No Chest pain, Orthopnea, PND, Edema in Lower Extremities, Anasarca, Dizziness, Palpitations  Resp:  +Dyspnea, No DOE,+ Productive Cough, No Non-Productive Cough, No Hemoptysis, +Wheezing.    GI: No Heartburn, Indigestion, Abdominal Pain, Nausea, Vomiting, Diarrhea, Constipation, Hematemesis, Hematochezia, Melena, Change in Bowel Habits,  Loss of Appetite  GU: No Dysuria, No Change in Color of Urine, No Urgency or Urinary Frequency, No Flank pain.  Musculoskeletal: No Joint Pain or Swelling, No Decreased Range of Motion, No Back Pain.  Neurologic: No Syncope, No Seizures, Muscle Weakness, Paresthesia, Vision Disturbance or Loss, No Diplopia, No Vertigo, No Difficulty Walking,  Skin: No Rash or Lesions. Psych: No Change in Mood or Affect, No Depression or Anxiety, No Memory loss, No Confusion, or Hallucinations   Past Medical History  Diagnosis Date  . COPD (chronic obstructive pulmonary disease)  (HCC)   . Asthma   . Hypertension   . Anxiety   . Diabetes mellitus      Past Surgical History  Procedure Laterality Date  . Back surgery        Prior to Admission medications   Medication Sig Start Date End Date Taking? Authorizing Provider  albuterol (PROAIR HFA) 108 (90 BASE) MCG/ACT inhaler Inhale 2 puffs into the lungs every 6 (six) hours as needed. Shortness of Breath    Historical Provider, MD  ALPRAZolam (XANAX) 0.25 MG tablet Take 0.25 mg by mouth 3 (three) times daily as needed for anxiety.    Historical Provider, MD  amLODipine (NORVASC) 5 MG tablet Take 1 tablet (5 mg total) by mouth daily. 06/03/13   Butch Penny, MD  cephALEXin (KEFLEX) 500 MG capsule Take 1 capsule (500 mg total) by mouth every 8 (eight) hours. 06/03/13   Butch Penny, MD  ibuprofen (ADVIL,MOTRIN) 800 MG tablet Take 800 mg by mouth daily as needed. For pain    Historical Provider, MD  ipratropium-albuterol (DUONEB) 0.5-2.5 (3) MG/3ML SOLN Take 3 mLs by nebulization every 4 (four) hours as needed. For shortness of breath    Historical Provider, MD  loratadine (CLARITIN) 10 MG tablet Take 1 tablet (10 mg total) by mouth daily. 06/03/13   Butch Penny, MD  metFORMIN (GLUCOPHAGE) 1000 MG tablet Take 1 tablet (1,000 mg total) by mouth 2 (two) times daily. 06/08/11   Angus McInnis, MD  montelukast (SINGULAIR) 10 MG tablet Take 10 mg by mouth at bedtime.      Historical Provider, MD  pantoprazole (PROTONIX) 40 MG tablet Take 1 tablet (40 mg total) by mouth daily. 06/03/13  Butch Penny, MD  predniSONE (DELTASONE) 10 MG tablet Take 20-30 mg by mouth daily.    Historical Provider, MD  rosuvastatin (CRESTOR) 10 MG tablet Take 10 mg by mouth daily.    Historical Provider, MD     No Known Allergies    Social History:  reports that he has quit smoking. His smoking use included Cigarettes. He smoked 0.00 packs per day for 10 years. He does not have any smokeless tobacco history on file. He reports that he does not  drink alcohol or use illicit drugs.     History reviewed. No pertinent family history.     Physical Exam:  GEN:  Pleasant Well Nourished and Well Developed 59 y.o. African American male examined and in no acute distress; cooperative with exam Filed Vitals:   08/03/15 1915 08/03/15 1923 08/03/15 1930 08/03/15 1945  BP:   126/76   Pulse: 109  108 108  Temp:      TempSrc:      Resp: Height:      Weight:      SpO2: 100% 100% 100% 97%   Blood pressure 126/76, pulse 108, temperature 97.7 F (36.5 C), temperature source Oral, resp. rate 19, height  (1.854 m), weight 77.111 kg (170 lb), SpO2 97 %. PSYCH: He is alert and oriented x4; does not appear anxious does not appear depressed; affect is normal HEENT: Normocephalic and Atraumatic, Mucous membranes pink; PERRLA; EOM intact; Fundi:  Benign;  No scleral icterus, Nares: Patent, Oropharynx: Clear, Sparse Dentition,    Neck:  FROM, No Cervical Lymphadenopathy nor Thyromegaly or Carotid Bruit; No JVD; Breasts:: Not examined CHEST WALL: No tenderness CHEST: Rhonchorous Breath sounds, No Rales,  Occassional Wheezes  HEART: Tachycardic Regular Rhythm; no murmurs rubs or gallops BACK: No kyphosis or scoliosis; No CVA tenderness ABDOMEN: Positive Bowel Sounds, Soft Non-Tender, No Rebound or Guarding; No Masses, No Organomegaly, No Pannus; No Intertriginous candida. Rectal Exam: Not done EXTREMITIES: No  Cyanosis, Clubbing, or Edema; No Ulcerations. Genitalia: not examined PULSES: 2+ and symmetric SKIN: Normal hydration no rash or ulceration CNS:  Alert and Oriented x 4, No Focal Deficits Vascular: pulses palpable throughout    Labs on Admission:  Basic Metabolic Panel:  Recent Labs Lab 08/03/15 1737  NA 136  K 3.7  CL 92*  CO2 37*  GLUCOSE 149*  BUN 16  CREATININE 1.08  CALCIUM 9.3   Liver Function Tests: No results for input(s): AST, ALT, ALKPHOS, BILITOT, PROT, ALBUMIN in the last 168 hours. No results for  input(s): LIPASE, AMYLASE in the last 168 hours. No results for input(s): AMMONIA in the last 168 hours. CBC:  Recent Labs Lab 08/03/15 1737  WBC 5.6  HGB 11.6*  HCT 37.2*  MCV 94.9  PLT 218   Cardiac Enzymes:  Recent Labs Lab 08/03/15 1737  TROPONINI 0.10*    BNP (last 3 results)  Recent Labs  08/03/15 1737  BNP 15.0    ProBNP (last 3 results) No results for input(s): PROBNP in the last 8760 hours.  CBG: No results for input(s): GLUCAP in the last 168 hours.  Radiological Exams on Admission: Dg Chest Port 1 View  08/03/2015  CLINICAL DATA:  Worsening shortness of breath with exertion over the last 2 days. Productive cough. Ex-smoker with history of hypertension, diabetes and COPD. Initial encounter. EXAM: PORTABLE CHEST 1 VIEW COMPARISON:  07/10/2015 and 05/19/2015. FINDINGS: The heart size and mediastinal contours are stable. The lungs remain markedly  hyperinflated. Increased markings at the lung bases, similar to the most recent study. No confluent airspace opacity, edema, pleural effusion or pneumothorax demonstrated. The bones appear unremarkable. IMPRESSION: Similar appearance of chronic obstructive lung disease and interstitial prominence at the lung bases. No confluent airspace opacity. Electronically Signed   By: Carey BullocksWilliam  Veazey M.D.   On: 08/03/2015 18:38     EKG: Independently reviewed.     Assessment/Plan:      59 y.o. male with  Principal Problem:   1.     COPD exacerbation (HCC)   DUONebs   IV Steroid, Steroid Taper   BiPAP /O2   Monitor O2 sats   IV Levaquin   Active Problems:   2.    Respiratory failure, acute and chronic (HCC)- Hypercarbic pCO2 = 74   BiPAP   Monitor O2 sats     3.    HTN (hypertension)   IV Hydralazine PRN while NPO on BiPAP   Monitor BPs     4.    DM (diabetes mellitus) (HCC)   SSI coverage PRN   Check Hb A1c in AM     5.    Anemia   Anemia Panel ordered     6.    DVT Prophylaxis   Lovenox    Code  Status:     FULL CODE      Disposition Plan:    Inpatient  Status with Anticipated Stay of 2-3 days     Time spent:  70 Minutes      Edmund Rick C [facility] Pager [phone]   If 7AM -7PM Please Contact the Day Rounding Team MD for [facility]  If 7PM-7AM, Please Contact Night-Floor Coverage  [email or web address] Password [email or web address] 08/03/2015, 8:06 PM     ADDENDUM:   Patient was seen and examined on 08/03/2015

## 2015-08-03 NOTE — ED Notes (Signed)
PT c/o SOB on exertion worsening x2 days with productive yellow sputum cough. PT on 2L per N/C continuous at home.

## 2015-08-03 NOTE — ED Notes (Signed)
Wife Celestia KhatMary Bohl asked that she be notified when pt has room assignment. Left number to be reached. [phone]. If not at that number may contact son Nanetta BattyKarl Strausser Jr. At [phone]. Wife was given hospital number so she may call and check on pt after he has been moved to floor.

## 2015-08-04 LAB — CBC
HCT: 34.4 % — ABNORMAL LOW (ref 39.0–52.0)
Hemoglobin: 10.7 g/dL — ABNORMAL LOW (ref 13.0–17.0)
MCH: 29.5 pg (ref 26.0–34.0)
MCHC: 31.1 g/dL (ref 30.0–36.0)
MCV: 94.8 fL (ref 78.0–100.0)
PLATELETS: 245 10*3/uL (ref 150–400)
RBC: 3.63 MIL/uL — AB (ref 4.22–5.81)
RDW: 12.4 % (ref 11.5–15.5)
WBC: 4.8 10*3/uL (ref 4.0–10.5)

## 2015-08-04 LAB — FOLATE: Folate: 8 ng/mL (ref >5.9)

## 2015-08-04 LAB — IRON AND TIBC
Iron: 29 ug/dL — ABNORMAL LOW (ref 45–182)
SATURATION RATIOS: 9 % — AB (ref 17.9–39.5)
TIBC: 311 ug/dL (ref 250–450)
UIBC: 282 ug/dL

## 2015-08-04 LAB — BASIC METABOLIC PANEL
Anion gap: 8 (ref 5–15)
BUN: 17 mg/dL (ref 6–20)
CALCIUM: 9.2 mg/dL (ref 8.9–10.3)
CO2: 35 mmol/L — AB (ref 22–32)
CREATININE: 1.17 mg/dL (ref 0.61–1.24)
Chloride: 92 mmol/L — ABNORMAL LOW (ref 101–111)
GFR calc Af Amer: >60 mL/min (ref >60)
GFR calc non Af Amer: >60 mL/min (ref >60)
Glucose, Bld: 202 mg/dL — ABNORMAL HIGH (ref 65–99)
Potassium: 5.3 mmol/L — ABNORMAL HIGH (ref 3.5–5.1)
Sodium: 135 mmol/L (ref 135–145)

## 2015-08-04 LAB — GLUCOSE, CAPILLARY
GLUCOSE-CAPILLARY: 120 mg/dL — AB (ref 65–99)
GLUCOSE-CAPILLARY: 176 mg/dL — AB (ref 65–99)
GLUCOSE-CAPILLARY: 295 mg/dL — AB (ref 65–99)
Glucose-Capillary: 169 mg/dL — ABNORMAL HIGH (ref 65–99)
Glucose-Capillary: 177 mg/dL — ABNORMAL HIGH (ref 65–99)
Glucose-Capillary: 238 mg/dL — ABNORMAL HIGH (ref 65–99)

## 2015-08-04 LAB — VITAMIN B12: Vitamin B-12: 425 pg/mL (ref 180–914)

## 2015-08-04 LAB — MRSA PCR SCREENING: MRSA BY PCR: NEGATIVE

## 2015-08-04 LAB — FERRITIN: FERRITIN: 181 ng/mL (ref 24–336)

## 2015-08-04 MED ORDER — ALBUTEROL SULFATE (2.5 MG/3ML) 0.083% IN NEBU
INHALATION_SOLUTION | RESPIRATORY_TRACT | Status: AC
  Administered 2015-08-04: 2.5 mg
  Filled 2015-08-04: qty 3

## 2015-08-04 MED ORDER — IPRATROPIUM-ALBUTEROL 0.5-2.5 (3) MG/3ML IN SOLN
3.0000 mL | Freq: Three times a day (TID) | RESPIRATORY_TRACT | Status: DC
  Administered 2015-08-04 – 2015-08-06 (×6): 3 mL via RESPIRATORY_TRACT
  Filled 2015-08-04 (×6): qty 3

## 2015-08-04 MED ORDER — METHYLPREDNISOLONE SODIUM SUCC 40 MG IJ SOLR
40.0000 mg | Freq: Four times a day (QID) | INTRAMUSCULAR | Status: DC
  Administered 2015-08-04 – 2015-08-06 (×8): 40 mg via INTRAVENOUS
  Filled 2015-08-04 (×8): qty 1

## 2015-08-04 MED ORDER — IPRATROPIUM BROMIDE 0.02 % IN SOLN
RESPIRATORY_TRACT | Status: AC
  Administered 2015-08-04: 0.5 mg
  Filled 2015-08-04: qty 2.5

## 2015-08-04 MED ORDER — IPRATROPIUM-ALBUTEROL 0.5-2.5 (3) MG/3ML IN SOLN: 3.0000 mL | RESPIRATORY_TRACT | Status: DC | PRN

## 2015-08-04 MED ORDER — AMOXICILLIN 250 MG PO CAPS
500.0000 mg | ORAL_CAPSULE | Freq: Three times a day (TID) | ORAL | Status: DC
  Administered 2015-08-04 – 2015-08-06 (×7): 500 mg via ORAL
  Filled 2015-08-04 (×3): qty 2
  Filled 2015-08-04: qty 1
  Filled 2015-08-04 (×2): qty 2
  Filled 2015-08-04: qty 1
  Filled 2015-08-04 (×3): qty 2

## 2015-08-04 MED ORDER — INSULIN ASPART 100 UNIT/ML ~~LOC~~ SOLN
0.0000 [IU] | Freq: Three times a day (TID) | SUBCUTANEOUS | Status: DC
  Administered 2015-08-04: 5 [IU] via SUBCUTANEOUS
  Administered 2015-08-05 (×2): 2 [IU] via SUBCUTANEOUS
  Administered 2015-08-05: 3 [IU] via SUBCUTANEOUS
  Administered 2015-08-05 – 2015-08-06 (×2): 2 [IU] via SUBCUTANEOUS

## 2015-08-04 NOTE — Consult Note (Addendum)
Consult requested by: Dr. Renard Matter Consult requested for acute on chronic respiratory failure:  HPI: This is a 59 year old who has a long-standing history of COPD and chronic respiratory failure. He came to the emergency department with increasing shortness of breath and was found to have acute on chronic hypercapnic respiratory failure. He's been on BiPAP. He says he feels better. He says he still feels like his chest is "tight." He has not been able to cough up any sputum. He has done relatively well until recently. He denies any fever or chills. He has not had any chest pain or hemoptysis.  Past Medical History  Diagnosis Date  . COPD (chronic obstructive pulmonary disease) (HCC)   . Asthma   . Hypertension   . Anxiety   . Diabetes mellitus      History reviewed. No pertinent family history.   Social History   Social History  . Marital Status: Married    Spouse Name: N/A  . Number of Children: N/A  . Years of Education: N/A   Social History Main Topics  . Smoking status: Former Smoker -- 0.00 packs/day for 10 years    Types: Cigarettes  . Smokeless tobacco: None  . Alcohol Use: No  . Drug Use: No  . Sexual Activity: Yes   Other Topics Concern  . None   Social History Narrative     ROS: Except as mentioned as per the history and physical    Objective: Vital signs in last 24 hours: Temp:  [97.2 F (36.2 C)-98.2 F (36.8 C)] 97.5 F (36.4 C) (10/31 0803) Pulse Rate:  [97-115] 99 (10/31 0600) Resp:  [11-31] 20 (10/31 0600) BP: (117-151)/(73-96) 128/86 mmHg (10/31 0600) SpO2:  [95 %-100 %] 98 % (10/31 0600) FiO2 (%):  [28 %-30 %] 28 % (10/31 0411) Weight:  [77.111 kg (170 lb)-77.5 kg (170 lb 13.7 oz)] 77.5 kg (170 lb 13.7 oz) (10/30 2200) Weight change:  Last BM Date: 08/02/15  Intake/Output from previous day: 10/30 0701 - 10/31 0700 In: 707.5 [I.V.:557.5; IV Piggyback:150] Out: 450 [Urine:450]  PHYSICAL EXAM He is awake and alert. He is in mild distress.  He is on BiPAP. He has a "barrel chest". His HEENT exam shows that he has multiple teeth that are in very poor repair. His mucous membranes are moist. His neck is supple without masses. His chest is clear but with decreased breath sounds and he sounds "tight". His heart is regular. His abdomen is soft without masses. Extremities showed no edema. Central nervous system examination is grossly intact  Lab Results: Basic Metabolic Panel:  Recent Labs  16/10/96 1737 08/04/15 0415  NA 136 135  K 3.7 5.3*  CL 92* 92*  CO2 37* 35*  GLUCOSE 149* 202*  BUN 16 17  CREATININE 1.08 1.17  CALCIUM 9.3 9.2   Liver Function Tests: No results for input(s): AST, ALT, ALKPHOS, BILITOT, PROT, ALBUMIN in the last 72 hours. No results for input(s): LIPASE, AMYLASE in the last 72 hours. No results for input(s): AMMONIA in the last 72 hours. CBC:  Recent Labs  08/03/15 1737 08/04/15 0415  WBC 5.6 4.8  HGB 11.6* 10.7*  HCT 37.2* 34.4*  MCV 94.9 94.8  PLT 218 245   Cardiac Enzymes:  Recent Labs  08/03/15 1737  TROPONINI 0.10*   BNP: No results for input(s): PROBNP in the last 72 hours. D-Dimer: No results for input(s): DDIMER in the last 72 hours. CBG:  Recent Labs  08/03/15 2352 08/04/15 0408 08/04/15  0741  GLUCAP 176* 169* 177*   Hemoglobin A1C: No results for input(s): HGBA1C in the last 72 hours. Fasting Lipid Panel: No results for input(s): CHOL, HDL, LDLCALC, TRIG, CHOLHDL, LDLDIRECT in the last 72 hours. Thyroid Function Tests: No results for input(s): TSH, T4TOTAL, FREET4, T3FREE, THYROIDAB in the last 72 hours. Anemia Panel:  Recent Labs  08/03/15 1737  RETICCTPCT 1.2   Coagulation: No results for input(s): LABPROT, INR in the last 72 hours. Urine Drug Screen: Drugs of Abuse  No results found for: LABOPIA, COCAINSCRNUR, LABBENZ, AMPHETMU, THCU, LABBARB  Alcohol Level: No results for input(s): ETH in the last 72 hours. Urinalysis: No results for input(s):  COLORURINE, LABSPEC, PHURINE, GLUCOSEU, HGBUR, BILIRUBINUR, KETONESUR, PROTEINUR, UROBILINOGEN, NITRITE, LEUKOCYTESUR in the last 72 hours.  Invalid input(s): APPERANCEUR Misc. Labs:   ABGS:  Recent Labs  08/03/15 1618  PHART 7.341*  PO2ART 117*  TCO2 15.4  HCO3 35.2*     MICROBIOLOGY: Recent Results (from the past 240 hour(s))  MRSA PCR Screening     Status: None   Collection Time: 08/03/15  9:52 PM  Result Value Ref Range Status   MRSA by PCR NEGATIVE NEGATIVE Final    Comment:        The GeneXpert MRSA Assay (FDA approved for NASAL specimens only), is one component of a comprehensive MRSA colonization surveillance program. It is not intended to diagnose MRSA infection nor to guide or monitor treatment for MRSA infections.     Studies/Results: Dg Chest Port 1 View  08/03/2015  CLINICAL DATA:  Worsening shortness of breath with exertion over the last 2 days. Productive cough. Ex-smoker with history of hypertension, diabetes and COPD. Initial encounter. EXAM: PORTABLE CHEST 1 VIEW COMPARISON:  07/10/2015 and 05/19/2015. FINDINGS: The heart size and mediastinal contours are stable. The lungs remain markedly hyperinflated. Increased markings at the lung bases, similar to the most recent study. No confluent airspace opacity, edema, pleural effusion or pneumothorax demonstrated. The bones appear unremarkable. IMPRESSION: Similar appearance of chronic obstructive lung disease and interstitial prominence at the lung bases. No confluent airspace opacity. Electronically Signed   By: Carey Bullocks M.D.   On: 08/03/2015 18:38    Medications:  Prior to Admission:  Prescriptions prior to admission  Medication Sig Dispense Refill Last Dose  . albuterol (PROAIR HFA) 108 (90 BASE) MCG/ACT inhaler Inhale 2 puffs into the lungs every 6 (six) hours as needed. Shortness of Breath   08/03/2015 at Unknown time  . ALPRAZolam (XANAX) 0.5 MG tablet Take 0.5 mg by mouth 3 (three) times daily  as needed for anxiety.   Past Week at Unknown time  . fexofenadine-pseudoephedrine (ALLEGRA-D 24) 180-240 MG 24 hr tablet Take 1 tablet by mouth daily.   08/03/2015 at Unknown time  . Guaifenesin (MUCINEX MAXIMUM STRENGTH) 1200 MG TB12 Take 1 tablet by mouth 2 (two) times daily.   08/02/2015 at Unknown time  . ibuprofen (ADVIL,MOTRIN) 800 MG tablet Take 800 mg by mouth daily as needed. For pain   08/03/2015 at Unknown time  . ipratropium-albuterol (DUONEB) 0.5-2.5 (3) MG/3ML SOLN Take 3 mLs by nebulization every 4 (four) hours as needed. For shortness of breath   08/03/2015 at Unknown time  . metFORMIN (GLUCOPHAGE) 1000 MG tablet Take 1 tablet (1,000 mg total) by mouth 2 (two) times daily. 60 tablet 5 08/03/2015 at Unknown time  . montelukast (SINGULAIR) 10 MG tablet Take 10 mg by mouth at bedtime.     08/02/2015 at Unknown time  Scheduled: . antiseptic oral rinse  7 mL Mouth Rinse BID  . enoxaparin (LOVENOX) injection  40 mg Subcutaneous Q24H  . insulin aspart  0-9 Units Subcutaneous 6 times per day  . ipratropium-albuterol  3 mL Nebulization Q4H  . levofloxacin (LEVAQUIN) IV  750 mg Intravenous Q24H  . methylPREDNISolone (SOLU-MEDROL) injection  40 mg Intravenous Q6H  . pantoprazole (PROTONIX) IV  40 mg Intravenous Q24H   Continuous: . sodium chloride 75 mL/hr at 08/03/15 2234   ZOX:[id number]:acetaminophen **OR** acetaminophen, hydrALAZINE, HYDROmorphone (DILAUDID) injection, LORazepam, ondansetron **OR** ondansetron (ZOFRAN) IV, oxyCODONE  Assesment: He is admitted with COPD exacerbation and acute on chronic respiratory failure. At baseline he has pretty severe COPD but he has been able to stop smoking. He has very bad dentition. I think he is on appropriate treatment for his COPD but we need to add steroids. He is on nebulized bronchodilators and after he is somewhat improved and less "tight" I think we can start him back on some long-acting bronchodilators. Levaquin will probably not cover his  dental problems on going to add oral amoxicillin Principal Problem:   COPD exacerbation (HCC) Active Problems:   Respiratory failure, acute and chronic (HCC)   HTN (hypertension)   DM (diabetes mellitus) (HCC)   Anemia    Plan: As above. Not mentioned before but he is obviously still critically ill requiring complex decision making    LOS: 1 day   Ranon Coven L 08/04/2015, 8:04 AM

## 2015-08-04 NOTE — Progress Notes (Signed)
Subjective: The patient feels better today. He does have chronic COPD and was admitted with acute on chronic respiratory failure. Started on intravenous antibiotics treatment and IV Levaquin as well as Solu-Medrol   Objective: Vital signs in last 24 hours: Temp:  [97.2 F (36.2 C)-98.3 F (36.8 C)] 98.3 F (36.8 C) (10/31 1208) Pulse Rate:  [97-115] 98 (10/31 0946) Resp:  [11-31] 18 (10/31 0946) BP: (117-151)/(69-96) 127/69 mmHg (10/31 0900) SpO2:  [95 %-100 %] 98 % (10/31 1230) FiO2 (%):  [28 %-30 %] 28 % (10/31 0411) Weight:  [77.111 kg (170 lb)-77.5 kg (170 lb 13.7 oz)] 77.5 kg (170 lb 13.7 oz) (10/30 2200) Weight change:  Last BM Date: 08/02/15  Intake/Output from previous day: 10/30 0701 - 10/31 0700 In: 707.5 [I.V.:557.5; IV Piggyback:150] Out: 450 [Urine:450] Intake/Output this shift: Total I/O In: -  Out: 675 [Urine:675]  Physical Exam: Gen. appearance the patient is alert and oriented  HEENT negative except for loose tooth  Neck supple no JVD or thyroid abnormalities  Lungs diminished breath sounds bilaterally  Heart regular rhythm no murmurs  Abdomen no palpable organs or masses  Extremities free of edema   Recent Labs  08/03/15 1737 08/04/15 0415  WBC 5.6 4.8  HGB 11.6* 10.7*  HCT 37.2* 34.4*  PLT 218 245   BMET  Recent Labs  08/03/15 1737 08/04/15 0415  NA 136 135  K 3.7 5.3*  CL 92* 92*  CO2 37* 35*  GLUCOSE 149* 202*  BUN 16 17  CREATININE 1.08 1.17  CALCIUM 9.3 9.2    Studies/Results: Dg Chest Port 1 View  08/03/2015  CLINICAL DATA:  Worsening shortness of breath with exertion over the last 2 days. Productive cough. Ex-smoker with history of hypertension, diabetes and COPD. Initial encounter. EXAM: PORTABLE CHEST 1 VIEW COMPARISON:  07/10/2015 and 05/19/2015. FINDINGS: The heart size and mediastinal contours are stable. The lungs remain markedly hyperinflated. Increased markings at the lung bases, similar to the most recent study.  No confluent airspace opacity, edema, pleural effusion or pneumothorax demonstrated. The bones appear unremarkable. IMPRESSION: Similar appearance of chronic obstructive lung disease and interstitial prominence at the lung bases. No confluent airspace opacity. Electronically Signed   By: Carey BullocksWilliam  Veazey M.D.   On: 08/03/2015 18:38    Medications:  . amoxicillin  500 mg Oral 3 times per day  . antiseptic oral rinse  7 mL Mouth Rinse BID  . enoxaparin (LOVENOX) injection  40 mg Subcutaneous Q24H  . insulin aspart  0-9 Units Subcutaneous TID AC & HS  . ipratropium-albuterol  3 mL Nebulization TID  . levofloxacin (LEVAQUIN) IV  750 mg Intravenous Q24H  . methylPREDNISolone (SOLU-MEDROL) injection  40 mg Intravenous Q6H  . pantoprazole (PROTONIX) IV  40 mg Intravenous Q24H    . sodium chloride 75 mL/hr at 08/04/15 1047     Assessment/Plan: 1. Acute on chronic respiratory failure COPD-plan to continue IV antibiotics Levaquin with addition of amoxicillin-continue Solu-Medrol and nebulizer treatments-nasal oxygen  2. Diabetes mellitus-2 continue aspart insulin as needed and diabetic diet   LOS: 1 day   Markeria Goetsch G 08/04/2015, 4:33 PM

## 2015-08-05 ENCOUNTER — Encounter (HOSPITAL_COMMUNITY): Payer: Self-pay | Admitting: *Deleted

## 2015-08-05 LAB — GLUCOSE, CAPILLARY
GLUCOSE-CAPILLARY: 186 mg/dL — AB (ref 65–99)
GLUCOSE-CAPILLARY: 183 mg/dL — AB (ref 65–99)
Glucose-Capillary: 230 mg/dL — ABNORMAL HIGH (ref 65–99)
Glucose-Capillary: 193 mg/dL — ABNORMAL HIGH (ref 65–99)

## 2015-08-05 LAB — HEMOGLOBIN A1C
HEMOGLOBIN A1C: 6.8 % — AB (ref 4.8–5.6)
MEAN PLASMA GLUCOSE: 148 mg/dL

## 2015-08-05 NOTE — Progress Notes (Signed)
Called report to LockhartJason, Charity fundraiserN on dept 300. Verbalized understanding. Pt transferred to 327 in safe and stable condition.

## 2015-08-05 NOTE — Care Management Note (Signed)
Case Management Note  Patient Details  Name: Jeff Ray MRN: [id number] Date of Birth: 05/28/1956  Subjective/Objective:                  Pt is from home, with strong family support. Pt has no HH services prior to admission. Pt is ind with ADL's. Pt has no HH services prior to admission. Pt uses cane for ambulation and has electric wheelchair for long distances. Pt has home O2 and neb machine through [employer].   Action/Plan: Pt plans to return home with self care at DC. Pt will cont to follow, may need PT eval prior to DC.   Expected Discharge Date:  08/06/15               Expected Discharge Plan:  Home/Self Care  In-House Referral:  NA  Discharge planning Services  CM Consult  Post Acute Care Choice:  NA Choice offered to:  NA  DME Arranged:    DME Agency:     HH Arranged:    HH Agency:     Status of Service:  In process, will continue to follow  Medicare Important Message Given:    Date Medicare IM Given:    Medicare IM give by:    Date Additional Medicare IM Given:    Additional Medicare Important Message give by:     If discussed at Long Length of Stay Meetings, dates discussed:    Additional Comments:  Jeff Ray, Jeff Chea Demske, RN 08/05/2015, 4:17 PM

## 2015-08-05 NOTE — Progress Notes (Signed)
Subjective: He says he feels much better. He is much less short of breath. No new complaints. He is on nasal oxygen and says he feels close to baseline  Objective: Vital signs in last 24 hours: Temp:  [97.5 F (36.4 C)-98.3 F (36.8 C)] 97.5 F (36.4 C) (11/01 0400) Pulse Rate:  [94-114] 107 (11/01 0700) Resp:  [16-31] 16 (11/01 0700) BP: (95-162)/(53-100) 130/74 mmHg (11/01 0700) SpO2:  [94 %-100 %] 97 % (11/01 0700) Weight:  [78 kg (171 lb 15.3 oz)] 78 kg (171 lb 15.3 oz) (11/01 0500) Weight change: 0.888 kg (1 lb 15.3 oz) Last BM Date: 08/04/15  Intake/Output from previous day: 10/31 0701 - 11/01 0700 In: 2100 [I.V.:1800; IV Piggyback:300] Out: 2050 [Urine:2050]  PHYSICAL EXAM General appearance: alert, cooperative and no distress Resp: Prolonged expiration and diminished breath sounds. He is much less "tight" than yesterday. Cardio: regular rate and rhythm, S1, S2 normal, no murmur, click, rub or gallop GI: soft, non-tender; bowel sounds normal; no masses,  no organomegaly Extremities: extremities normal, atraumatic, no cyanosis or edema  Lab Results:  Results for orders placed or performed during the hospital encounter of 08/03/15 (from the past 48 hour(s))  Blood gas, arterial     Status: Abnormal   Collection Time: 08/03/15  4:18 PM  Result Value Ref Range   O2 Content 2.5 L/min   Delivery systems NASAL CANNULA    pH, Arterial 7.341 (L) 7.350 - 7.450   pCO2 arterial 74.9 (HH) 35.0 - 45.0 mmHg    Comment: CRITICAL RESULT CALLED TO, READ BACK BY AND VERIFIED WITH: GINGER PRUITT,RN AT 1626 BY VANESSA LAWSON,RRT,RCP ON 08/03/15    pO2, Arterial 117 (H) 80.0 - 100.0 mmHg   Bicarbonate 35.2 (H) 20.0 - 24.0 mEq/L   TCO2 15.4 0 - 100 mmol/L   Acid-Base Excess 13.3 (H) 0.0 - 2.0 mmol/L   O2 Saturation 98.1 %   Patient temperature 37.0    Collection site LEFT RADIAL    Drawn by [id number]    Sample type ARTERIAL    Allens test (pass/fail) PASS PASS  Basic metabolic panel      Status: Abnormal   Collection Time: 08/03/15  5:37 PM  Result Value Ref Range   Sodium 136 135 - 145 mmol/L   Potassium 3.7 3.5 - 5.1 mmol/L   Chloride 92 (L) 101 - 111 mmol/L   CO2 37 (H) 22 - 32 mmol/L   Glucose, Bld 149 (H) 65 - 99 mg/dL   BUN 16 6 - 20 mg/dL   Creatinine, Ser 1.08 0.61 - 1.24 mg/dL   Calcium 9.3 8.9 - 10.3 mg/dL   GFR calc non Af Amer >60 >60 mL/min   GFR calc Af Amer >60 >60 mL/min    Comment: (NOTE) The eGFR has been calculated using the CKD EPI equation. This calculation has not been validated in all clinical situations. eGFR's persistently <60 mL/min signify possible Chronic Kidney Disease.    Anion gap 7 5 - 15  CBC     Status: Abnormal   Collection Time: 08/03/15  5:37 PM  Result Value Ref Range   WBC 5.6 4.0 - 10.5 K/uL   RBC 3.92 (L) 4.22 - 5.81 MIL/uL   Hemoglobin 11.6 (L) 13.0 - 17.0 g/dL   HCT 37.2 (L) 39.0 - 52.0 %   MCV 94.9 78.0 - 100.0 fL   MCH 29.6 26.0 - 34.0 pg   MCHC 31.2 30.0 - 36.0 g/dL   RDW 12.4 11.5 - 15.5 %     Platelets 218 150 - 400 K/uL  Troponin I     Status: Abnormal   Collection Time: 08/03/15  5:37 PM  Result Value Ref Range   Troponin I 0.10 (H) <0.031 ng/mL    Comment:        PERSISTENTLY INCREASED TROPONIN VALUES IN THE RANGE OF 0.04-0.49 ng/mL CAN BE SEEN IN:       -UNSTABLE ANGINA       -CONGESTIVE HEART FAILURE       -MYOCARDITIS       -CHEST TRAUMA       -ARRYHTHMIAS       -LATE PRESENTING MYOCARDIAL INFARCTION       -COPD   CLINICAL FOLLOW-UP RECOMMENDED.   Brain natriuretic peptide     Status: None   Collection Time: 08/03/15  5:37 PM  Result Value Ref Range   B Natriuretic Peptide 15.0 0.0 - 100.0 pg/mL  Reticulocytes     Status: Abnormal   Collection Time: 08/03/15  5:37 PM  Result Value Ref Range   Retic Ct Pct 1.2 0.4 - 3.1 %   RBC. 3.92 (L) 4.22 - 5.81 MIL/uL   Retic Count, Manual 47.0 19.0 - 186.0 K/uL  MRSA PCR Screening     Status: None   Collection Time: 08/03/15  9:52 PM  Result Value  Ref Range   MRSA by PCR NEGATIVE NEGATIVE    Comment:        The GeneXpert MRSA Assay (FDA approved for NASAL specimens only), is one component of a comprehensive MRSA colonization surveillance program. It is not intended to diagnose MRSA infection nor to guide or monitor treatment for MRSA infections.   Glucose, capillary     Status: Abnormal   Collection Time: 08/03/15 11:52 PM  Result Value Ref Range   Glucose-Capillary 176 (H) 65 - 99 mg/dL  Glucose, capillary     Status: Abnormal   Collection Time: 08/04/15  4:08 AM  Result Value Ref Range   Glucose-Capillary 169 (H) 65 - 99 mg/dL  Vitamin B12     Status: None   Collection Time: 08/04/15  4:14 AM  Result Value Ref Range   Vitamin B-12 425 180 - 914 pg/mL    Comment: (NOTE) This assay is not validated for testing neonatal or myeloproliferative syndrome specimens for Vitamin B12 levels. Performed at [facility]   Folate     Status: None   Collection Time: 08/04/15  4:14 AM  Result Value Ref Range   Folate 8.0 >5.9 ng/mL    Comment: Performed at [facility]  Iron and TIBC     Status: Abnormal   Collection Time: 08/04/15  4:14 AM  Result Value Ref Range   Iron 29 (L) 45 - 182 ug/dL   TIBC 311 250 - 450 ug/dL   Saturation Ratios 9 (L) 17.9 - 39.5 %   UIBC 282 ug/dL    Comment: Performed at [facility]  Ferritin     Status: None   Collection Time: 08/04/15  4:14 AM  Result Value Ref Range   Ferritin 181 24 - 336 ng/mL    Comment: Performed at [place] metabolic panel     Status: Abnormal   Collection Time: 08/04/15  4:15 AM  Result Value Ref Range   Sodium 135 135 - 145 mmol/L   Potassium 5.3 (H) 3.5 - 5.1 mmol/L    Comment: DELTA CHECK NOTED   Chloride 92 (L) 101 - 111 mmol/L  CO2 35 (H) 22 - 32 mmol/L   Glucose, Bld 202 (H) 65 - 99 mg/dL   BUN 17 6 - 20 mg/dL   Creatinine, Ser 1.17 0.61 - 1.24 mg/dL   Calcium 9.2 8.9 - 10.3 mg/dL   GFR calc non Af Amer >60 >60  mL/min   GFR calc Af Amer >60 >60 mL/min    Comment: (NOTE) The eGFR has been calculated using the CKD EPI equation. This calculation has not been validated in all clinical situations. eGFR's persistently <60 mL/min signify possible Chronic Kidney Disease.    Anion gap 8 5 - 15  CBC     Status: Abnormal   Collection Time: 08/04/15  4:15 AM  Result Value Ref Range   WBC 4.8 4.0 - 10.5 K/uL   RBC 3.63 (L) 4.22 - 5.81 MIL/uL   Hemoglobin 10.7 (L) 13.0 - 17.0 g/dL   HCT 34.4 (L) 39.0 - 52.0 %   MCV 94.8 78.0 - 100.0 fL   MCH 29.5 26.0 - 34.0 pg   MCHC 31.1 30.0 - 36.0 g/dL   RDW 12.4 11.5 - 15.5 %   Platelets 245 150 - 400 K/uL  Glucose, capillary     Status: Abnormal   Collection Time: 08/04/15  7:41 AM  Result Value Ref Range   Glucose-Capillary 177 (H) 65 - 99 mg/dL   Comment 1 Notify RN    Comment 2 Document in Chart   Glucose, capillary     Status: Abnormal   Collection Time: 08/04/15 11:29 AM  Result Value Ref Range   Glucose-Capillary 238 (H) 65 - 99 mg/dL   Comment 1 Notify RN    Comment 2 Document in Chart   Glucose, capillary     Status: Abnormal   Collection Time: 08/04/15  4:44 PM  Result Value Ref Range   Glucose-Capillary 120 (H) 65 - 99 mg/dL   Comment 1 Notify RN    Comment 2 Document in Chart   Glucose, capillary     Status: Abnormal   Collection Time: 08/04/15  9:11 PM  Result Value Ref Range   Glucose-Capillary 295 (H) 65 - 99 mg/dL    ABGS  Recent Labs  08/03/15 1618  PHART 7.341*  PO2ART 117*  TCO2 15.4  HCO3 35.2*   CULTURES Recent Results (from the past 240 hour(s))  MRSA PCR Screening     Status: None   Collection Time: 08/03/15  9:52 PM  Result Value Ref Range Status   MRSA by PCR NEGATIVE NEGATIVE Final    Comment:        The GeneXpert MRSA Assay (FDA approved for NASAL specimens only), is one component of a comprehensive MRSA colonization surveillance program. It is not intended to diagnose MRSA infection nor to guide  or monitor treatment for MRSA infections.    Studies/Results: Dg Chest Port 1 View  08/03/2015  CLINICAL DATA:  Worsening shortness of breath with exertion over the last 2 days. Productive cough. Ex-smoker with history of hypertension, diabetes and COPD. Initial encounter. EXAM: PORTABLE CHEST 1 VIEW COMPARISON:  07/10/2015 and 05/19/2015. FINDINGS: The heart size and mediastinal contours are stable. The lungs remain markedly hyperinflated. Increased markings at the lung bases, similar to the most recent study. No confluent airspace opacity, edema, pleural effusion or pneumothorax demonstrated. The bones appear unremarkable. IMPRESSION: Similar appearance of chronic obstructive lung disease and interstitial prominence at the lung bases. No confluent airspace opacity. Electronically Signed   By: Caryl Comes.D.  On: 08/03/2015 18:38    Medications:  Prior to Admission:  Prescriptions prior to admission  Medication Sig Dispense Refill Last Dose  . albuterol (PROAIR HFA) 108 (90 BASE) MCG/ACT inhaler Inhale 2 puffs into the lungs every 6 (six) hours as needed. Shortness of Breath   08/03/2015 at Unknown time  . ALPRAZolam (XANAX) 0.5 MG tablet Take 0.5 mg by mouth 3 (three) times daily as needed for anxiety.   Past Week at Unknown time  . fexofenadine-pseudoephedrine (ALLEGRA-D 24) 180-240 MG 24 hr tablet Take 1 tablet by mouth daily.   08/03/2015 at Unknown time  . Guaifenesin (MUCINEX MAXIMUM STRENGTH) 1200 MG TB12 Take 1 tablet by mouth 2 (two) times daily.   08/02/2015 at Unknown time  . ibuprofen (ADVIL,MOTRIN) 800 MG tablet Take 800 mg by mouth daily as needed. For pain   08/03/2015 at Unknown time  . ipratropium-albuterol (DUONEB) 0.5-2.5 (3) MG/3ML SOLN Take 3 mLs by nebulization every 4 (four) hours as needed. For shortness of breath   08/03/2015 at Unknown time  . metFORMIN (GLUCOPHAGE) 1000 MG tablet Take 1 tablet (1,000 mg total) by mouth 2 (two) times daily. 60 tablet 5  08/03/2015 at Unknown time  . montelukast (SINGULAIR) 10 MG tablet Take 10 mg by mouth at bedtime.     08/02/2015 at Unknown time   Scheduled: . amoxicillin  500 mg Oral 3 times per day  . antiseptic oral rinse  7 mL Mouth Rinse BID  . enoxaparin (LOVENOX) injection  40 mg Subcutaneous Q24H  . insulin aspart  0-9 Units Subcutaneous TID AC & HS  . ipratropium-albuterol  3 mL Nebulization TID  . levofloxacin (LEVAQUIN) IV  750 mg Intravenous Q24H  . methylPREDNISolone (SOLU-MEDROL) injection  40 mg Intravenous Q6H  . pantoprazole (PROTONIX) IV  40 mg Intravenous Q24H   Continuous: . sodium chloride 75 mL/hr at 08/04/15 2310   PRN:acetaminophen **OR** acetaminophen, hydrALAZINE, HYDROmorphone (DILAUDID) injection, ipratropium-albuterol, LORazepam, ondansetron **OR** ondansetron (ZOFRAN) IV, oxyCODONE  Assesment: He has acute on chronic respiratory failure with COPD exacerbation. He initially was treated with BiPAP but is now on nasal oxygen and looks much better. I think from a pulmonary point of view he is okay to move out of stepdown. Principal Problem:   COPD exacerbation (HCC) Active Problems:   Respiratory failure, acute and chronic (HCC)   HTN (hypertension)   DM (diabetes mellitus) (HCC)   Anemia    Plan: Continue treatments. I agree he needs to continue with his IV antibiotics and steroids today.    LOS: 2 days   HAWKINS,EDWARD L 08/05/2015, 7:39 AM   

## 2015-08-05 NOTE — Progress Notes (Signed)
Patient said he don't feel like wearing BIPAP at this time, states he feels a lot better. RT will continue to monitor.

## 2015-08-05 NOTE — Progress Notes (Signed)
Subjective: Patient had a better night. He does have chronic COPD who was admitted with acute chronic respiratory failure and started on intravenous antibiotics IV Levaquin and Solu-Medrol. He's still complains of his dental problem  Objective: Vital signs in last 24 hours: Temp:  [97.5 F (36.4 C)-98.3 F (36.8 C)] 97.5 F (36.4 C) (11/01 0400) Pulse Rate:  [94-114] 107 (11/01 0600) Resp:  [17-31] 31 (11/01 0600) BP: (95-162)/(53-100) 119/100 mmHg (11/01 0600) SpO2:  [94 %-100 %] 96 % (11/01 0600) Weight:  [78 kg (171 lb 15.3 oz)] 78 kg (171 lb 15.3 oz) (11/01 0500) Weight change: 0.888 kg (1 lb 15.3 oz) Last BM Date: 08/04/15  Intake/Output from previous day: 10/31 0701 - 11/01 0700 In: 2100 [I.V.:1800; IV Piggyback:300] Out: 2050 [Urine:2050] Intake/Output this shift:    Physical Exam: Gen. appearance the patient is alert and oriented  HEENT negative except for loose 2  Neck supple no JVD or thyroid abnormalities  Lungs diminished breath sounds bilaterally  Heart regular rhythm no murmurs  Abdomen no palpable organs or masses  Extremities free of edema   Recent Labs  08/03/15 1737 08/04/15 0415  WBC 5.6 4.8  HGB 11.6* 10.7*  HCT 37.2* 34.4*  PLT 218 245   BMET  Recent Labs  08/03/15 1737 08/04/15 0415  NA 136 135  K 3.7 5.3*  CL 92* 92*  CO2 37* 35*  GLUCOSE 149* 202*  BUN 16 17  CREATININE 1.08 1.17  CALCIUM 9.3 9.2    Studies/Results: Dg Chest Port 1 View  08/03/2015  CLINICAL DATA:  Worsening shortness of breath with exertion over the last 2 days. Productive cough. Ex-smoker with history of hypertension, diabetes and COPD. Initial encounter. EXAM: PORTABLE CHEST 1 VIEW COMPARISON:  07/10/2015 and 05/19/2015. FINDINGS: The heart size and mediastinal contours are stable. The lungs remain markedly hyperinflated. Increased markings at the lung bases, similar to the most recent study. No confluent airspace opacity, edema, pleural effusion or  pneumothorax demonstrated. The bones appear unremarkable. IMPRESSION: Similar appearance of chronic obstructive lung disease and interstitial prominence at the lung bases. No confluent airspace opacity. Electronically Signed   By: Carey BullocksWilliam  Veazey M.D.   On: 08/03/2015 18:38    Medications:  . amoxicillin  500 mg Oral 3 times per day  . antiseptic oral rinse  7 mL Mouth Rinse BID  . enoxaparin (LOVENOX) injection  40 mg Subcutaneous Q24H  . insulin aspart  0-9 Units Subcutaneous TID AC & HS  . ipratropium-albuterol  3 mL Nebulization TID  . levofloxacin (LEVAQUIN) IV  750 mg Intravenous Q24H  . methylPREDNISolone (SOLU-MEDROL) injection  40 mg Intravenous Q6H  . pantoprazole (PROTONIX) IV  40 mg Intravenous Q24H    . sodium chloride 75 mL/hr at 08/04/15 2310     Assessment/Plan: 1. Acute on chronic respiratory failure COPD plan to continue IV antibiotics Levaquin amoxicillin and steroids  2. Diabetes mellitus type 2 continue aspart insulin as needed and diabetic diet    LOS: 2 days   Jerolyn Flenniken G 08/05/2015, 7:03 AM

## 2015-08-06 LAB — GLUCOSE, CAPILLARY: GLUCOSE-CAPILLARY: 152 mg/dL — AB (ref 65–99)

## 2015-08-06 MED ORDER — AMOXICILLIN 500 MG PO CAPS: 500.0000 mg | ORAL_CAPSULE | Freq: Three times a day (TID) | ORAL | Status: DC

## 2015-08-06 NOTE — Care Management Note (Signed)
Case Management Note  Patient Details  Name: Jeff Ray MRN: [id number] Date of Birth: 05/28/1956  Subjective/Objective:                    Action/Plan:   Expected Discharge Date:  08/06/15               Expected Discharge Plan:  Home/Self Care  In-House Referral:  NA  Discharge planning Services  CM Consult  Post Acute Care Choice:  NA Choice offered to:  NA  DME Arranged:    DME Agency:     HH Arranged:    HH Agency:     Status of Service:  Completed, signed off  Medicare Important Message Given:  Yes-second notification given Date Medicare IM Given:    Medicare IM give by:    Date Additional Medicare IM Given:    Additional Medicare Important Message give by:     If discussed at Long Length of Stay Meetings, dates discussed:    Additional Comments: Pt discharged home today. Pt has home O2 and neb machine in place. No CM needs noted. Arlyss QueenBlackwell, Chauntay Paszkiewicz Hernandorowder, RN 08/06/2015, 9:36 AM

## 2015-08-06 NOTE — Progress Notes (Signed)
Patient alert and oriented, independent, VSS, pt. Tolerating diet well. No complaints of pain or nausea. Pt. Had IV removed tip intact. Pt. Had prescriptions given. Pt. Voiced understanding of discharge instructions with no further questions. Pt. Discharged via wheelchair with auxilliary.  

## 2015-08-06 NOTE — Care Management Important Message (Signed)
Important Message  Patient Details  Name: Jeff Ray MRN: [id number] Date of Birth: 04/16/1956   Medicare Important Message Given:  Yes-second notification given    Cheryl FlashBlackwell, Francisco Ostrovsky Crowder, RN 08/06/2015, 9:36 AM

## 2015-08-06 NOTE — Discharge Summary (Cosign Needed)
Physician Discharge Summary  Jeff ImusKarl E Ray UJW:[id number] DOB: 09/19/1956 DOA: 08/03/2015  PCP: Jeff ReichertMCINNIS,Jeff Leisner G, MD  Admit date: 08/03/2015 Discharge date: 08/06/2015     Discharge Diagnoses:  Acute on chronic respiratory failure COPD  Diabetes mellitus type 2  Essential hypertension  Dental abscess  Discharge Condition: Stable Disposition: Home  Diet recommendation: 2000 Carrie ADA diet  Filed Weights   08/03/15 1644 08/03/15 2200 08/05/15 0500  Weight: 77.111 kg (170 lb) 77.5 kg (170 lb 13.7 oz) 78 kg (171 lb 15.3 oz)    History of present illness:  The patient was admitted through the ED with shortness of breath. He is coughing thick sputum. He does have a history of diabetes, chronic obstructive pulmonary disease, and hypertension. He normally uses 2 L of oxygen at home and had been acutely short of breath for 2 days prior to the visit to the emergency department. Treated with BiPAP and subsequently admitted to stepdown unit in the intensive care  Hospital Course:  Examination on admission reveal evidence of dyspnea HEENT negative with exception of several loose teeth lungs diminished breath sounds heart regular rhythm abdomen no palpable organs or masses extremities show no edema the patient was treated with BiPAP initially ABG pH 7.31 PCO2 74 chest x-ray showed no acute process EKG showed evidence of sinus tachycardia patient was placed on continuous nebulizer albuterol initially and started on IV Solu-Medrol IV Levaquin. He progressively improved. He was seen in consultation by pulmonology Augmentin was added to regimen because of dental abscess. Patient progressively improved and was able to be transferred to MedSurg bed on last day of hospitalization   Discharge Instructions The patient will be discharged home. He will be continued on the medications listed below as well as Levaquin 500 milligram daily prednisone Dosepak 10 mg 12 day and Glucophage 1000 mg twice a day also  amoxicillin 500 mg twice a day. He was instructed to follow-up with primary care physician within the next week    Medication List    TAKE these medications        ALPRAZolam 0.5 MG tablet  Commonly known as:  XANAX  Take 0.5 mg by mouth 3 (three) times daily as needed for anxiety.     amoxicillin 500 MG capsule  Commonly known as:  AMOXIL  Take 1 capsule (500 mg total) by mouth 3 (three) times daily.     fexofenadine-pseudoephedrine 180-240 MG 24 hr tablet  Commonly known as:  ALLEGRA-D 24  Take 1 tablet by mouth daily.     ibuprofen 800 MG tablet  Commonly known as:  ADVIL,MOTRIN  Take 800 mg by mouth daily as needed. For pain     ipratropium-albuterol 0.5-2.5 (3) MG/3ML Soln  Commonly known as:  DUONEB  Take 3 mLs by nebulization every 4 (four) hours as needed. For shortness of breath     metFORMIN 1000 MG tablet  Commonly known as:  GLUCOPHAGE  Take 1 tablet (1,000 mg total) by mouth 2 (two) times daily.     MUCINEX MAXIMUM STRENGTH 1200 MG Tb12  Generic drug:  Guaifenesin  Take 1 tablet by mouth 2 (two) times daily.     PROAIR HFA 108 (90 BASE) MCG/ACT inhaler  Generic drug:  albuterol  Inhale 2 puffs into the lungs every 6 (six) hours as needed. Shortness of Breath      ASK your doctor about these medications        montelukast 10 MG tablet  Commonly known as:  SINGULAIR  Take 10 mg by mouth at bedtime.       No Known Allergies  The results of significant diagnostics from this hospitalization (including imaging, microbiology, ancillary and laboratory) are listed below for reference.    Significant Diagnostic Studies: Dg Chest 2 View  07/11/2015  CLINICAL DATA:  Cough, shortness of breath, low-grade fever. EXAM: CHEST  2 VIEW COMPARISON:  PA and lateral chest x-ray of May 19, 2015 FINDINGS: The lungs are hyperinflated with hemidiaphragm flattening. The interstitial markings are mildly increased and slightly more conspicuous than on the previous study.  There is no alveolar infiltrate. There is no pleural effusion. The heart and pulmonary vascularity are normal. The bony thorax is unremarkable. IMPRESSION: COPD likely with superimposed acute bronchitic changes. There is no alveolar pneumonia. Follow-up radiographs are recommended if the patient's symptoms persist despite anticipated antibiotic therapy. Electronically Signed   By: Jeff  [place] M.D.   On: 07/11/2015 08:06   Dg Chest Port 1 View  08/03/2015  CLINICAL DATA:  Worsening shortness of breath with exertion over the last 2 days. Productive cough. Ex-smoker with history of hypertension, diabetes and COPD. Initial encounter. EXAM: PORTABLE CHEST 1 VIEW COMPARISON:  07/10/2015 and 05/19/2015. FINDINGS: The heart size and mediastinal contours are stable. The lungs remain markedly hyperinflated. Increased markings at the lung bases, similar to the most recent study. No confluent airspace opacity, edema, pleural effusion or pneumothorax demonstrated. The bones appear unremarkable. IMPRESSION: Similar appearance of chronic obstructive lung disease and interstitial prominence at the lung bases. No confluent airspace opacity. Electronically Signed   By: Jeff Bullocks M.D.   On: 08/03/2015 18:38    Microbiology: Recent Results (from the past 240 hour(s))  MRSA PCR Screening     Status: None   Collection Time: 08/03/15  9:52 PM  Result Value Ref Range Status   MRSA by PCR NEGATIVE NEGATIVE Final    Comment:        The GeneXpert MRSA Assay (FDA approved for NASAL specimens only), is one component of a comprehensive MRSA colonization surveillance program. It is not intended to diagnose MRSA infection nor to guide or monitor treatment for MRSA infections.      Labs: Basic Metabolic Panel:  Recent Labs Lab 08/03/15 1737 08/04/15 0415  NA 136 135  K 3.7 5.3*  CL 92* 92*  CO2 37* 35*  GLUCOSE 149* 202*  BUN 16 17  CREATININE 1.08 1.17  CALCIUM 9.3 9.2   Liver Function Tests: No  results for input(s): AST, ALT, ALKPHOS, BILITOT, PROT, ALBUMIN in the last 168 hours. No results for input(s): LIPASE, AMYLASE in the last 168 hours. No results for input(s): AMMONIA in the last 168 hours. CBC:  Recent Labs Lab 08/03/15 1737 08/04/15 0415  WBC 5.6 4.8  HGB 11.6* 10.7*  HCT 37.2* 34.4*  MCV 94.9 94.8  PLT 218 245   Cardiac Enzymes:  Recent Labs Lab 08/03/15 1737  TROPONINI 0.10*   BNP: BNP (last 3 results)  Recent Labs  08/03/15 1737  BNP 15.0    ProBNP (last 3 results) No results for input(s): PROBNP in the last 8760 hours.  CBG:  Recent Labs Lab 08/04/15 2111 08/05/15 0725 08/05/15 1139 08/05/15 1621 08/05/15 2044  GLUCAP 295* 186* 183* 230* 193*    Principal Problem:   COPD exacerbation (HCC) Active Problems:   Respiratory failure, acute and chronic (HCC)   HTN (hypertension)   DM (diabetes mellitus) (HCC)   Anemia   Time coordinating discharge: 30 minutes  Signed:  Butch Penny, MD 08/06/2015, 6:38 AM

## 2015-11-27 ENCOUNTER — Emergency Department (HOSPITAL_COMMUNITY)
Admission: EM | Admit: 2015-11-27 | Discharge: 2015-11-27 | Disposition: A | Discharge status: Home / Self Care | Payer: Medicare HMO | Attending: Emergency Medicine | Admitting: Emergency Medicine

## 2015-11-27 ENCOUNTER — Encounter (HOSPITAL_COMMUNITY): Payer: Self-pay | Admitting: Emergency Medicine

## 2015-11-27 ENCOUNTER — Emergency Department (HOSPITAL_COMMUNITY): Payer: Medicare HMO

## 2015-11-27 DIAGNOSIS — Z87891 Personal history of nicotine dependence: Secondary | ICD-10-CM | POA: Insufficient documentation

## 2015-11-27 DIAGNOSIS — Z79899 Other long term (current) drug therapy: Secondary | ICD-10-CM | POA: Insufficient documentation

## 2015-11-27 DIAGNOSIS — R0602 Shortness of breath: Secondary | ICD-10-CM | POA: Diagnosis present

## 2015-11-27 DIAGNOSIS — J209 Acute bronchitis, unspecified: Secondary | ICD-10-CM | POA: Diagnosis not present

## 2015-11-27 DIAGNOSIS — Z7984 Long term (current) use of oral hypoglycemic drugs: Secondary | ICD-10-CM | POA: Diagnosis not present

## 2015-11-27 DIAGNOSIS — J449 Chronic obstructive pulmonary disease, unspecified: Secondary | ICD-10-CM | POA: Insufficient documentation

## 2015-11-27 DIAGNOSIS — I10 Essential (primary) hypertension: Secondary | ICD-10-CM | POA: Insufficient documentation

## 2015-11-27 DIAGNOSIS — E1165 Type 2 diabetes mellitus with hyperglycemia: Secondary | ICD-10-CM | POA: Insufficient documentation

## 2015-11-27 DIAGNOSIS — J45909 Unspecified asthma, uncomplicated: Secondary | ICD-10-CM | POA: Insufficient documentation

## 2015-11-27 LAB — CBC WITH DIFFERENTIAL/PLATELET
BASOS PCT: 0 %
Basophils Absolute: 0 10*3/uL (ref 0.0–0.1)
EOS ABS: 0.1 10*3/uL (ref 0.0–0.7)
EOS PCT: 1 %
HCT: 38.2 % — ABNORMAL LOW (ref 39.0–52.0)
Hemoglobin: 12.3 g/dL — ABNORMAL LOW (ref 13.0–17.0)
LYMPHS ABS: 2 10*3/uL (ref 0.7–4.0)
Lymphocytes Relative: 31 %
MCH: 28.8 pg (ref 26.0–34.0)
MCHC: 32.2 g/dL (ref 30.0–36.0)
MCV: 89.5 fL (ref 78.0–100.0)
Monocytes Absolute: 0.5 10*3/uL (ref 0.1–1.0)
Monocytes Relative: 8 %
Neutro Abs: 3.9 10*3/uL (ref 1.7–7.7)
Neutrophils Relative %: 60 %
PLATELETS: 254 10*3/uL (ref 150–400)
RBC: 4.27 MIL/uL (ref 4.22–5.81)
RDW: 13.1 % (ref 11.5–15.5)
WBC: 6.6 10*3/uL (ref 4.0–10.5)

## 2015-11-27 LAB — BASIC METABOLIC PANEL
Anion gap: 7 (ref 5–15)
BUN: 15 mg/dL (ref 6–20)
CALCIUM: 9 mg/dL (ref 8.9–10.3)
CHLORIDE: 96 mmol/L — AB (ref 101–111)
CO2: 31 mmol/L (ref 22–32)
CREATININE: 1.12 mg/dL (ref 0.61–1.24)
GFR calc Af Amer: >60 mL/min (ref >60)
GFR calc non Af Amer: >60 mL/min (ref >60)
Glucose, Bld: 261 mg/dL — ABNORMAL HIGH (ref 65–99)
Potassium: 3.8 mmol/L (ref 3.5–5.1)
SODIUM: 134 mmol/L — AB (ref 135–145)

## 2015-11-27 MED ORDER — IPRATROPIUM BROMIDE 0.02 % IN SOLN
1.0000 mg | Freq: Once | RESPIRATORY_TRACT | Status: AC
  Administered 2015-11-27: 1 mg via RESPIRATORY_TRACT
  Filled 2015-11-27: qty 5

## 2015-11-27 MED ORDER — ALBUTEROL (5 MG/ML) CONTINUOUS INHALATION SOLN
15.0000 mg/h | INHALATION_SOLUTION | Freq: Once | RESPIRATORY_TRACT | Status: AC
  Administered 2015-11-27: 15 mg/h via RESPIRATORY_TRACT
  Filled 2015-11-27: qty 20

## 2015-11-27 MED ORDER — AZITHROMYCIN 250 MG PO TABS
500.0000 mg | ORAL_TABLET | Freq: Once | ORAL | Status: AC
Start: 2015-11-27 — End: 2015-11-27
  Administered 2015-11-27: 500 mg via ORAL
  Filled 2015-11-27: qty 2

## 2015-11-27 MED ORDER — AZITHROMYCIN 250 MG PO TABS
250.0000 mg | ORAL_TABLET | Freq: Every day | ORAL | Status: DC
Start: 2015-11-27 — End: 2016-02-18

## 2015-11-27 NOTE — ED Notes (Signed)
Pt reports sob x3 days,central cp with no radiation.  pt has copd and is on 2.5L 02 at home.

## 2015-11-27 NOTE — ED Provider Notes (Signed)
CSN: [id number]     Arrival date & time 11/27/15  1151 History   First MD Initiated Contact with Patient 11/27/15 1208     Chief Complaint  Patient presents with  . Shortness of Breath     (Consider location/radiation/quality/duration/timing/severity/associated sxs/prior Treatment) HPI Complains of shortness of breath typical of COPD onset 3 days ago coming by cough productive of greenish sputum. Cough is minimal. He denies fever. He's been treating himself with his albuterol nebulizer with partial relief. Nothing makes symptoms better or worse. No other associated symptoms Past Medical History  Diagnosis Date  . COPD (chronic obstructive pulmonary disease) (HCC)   . Asthma   . Hypertension   . Anxiety   . Diabetes mellitus    Past Surgical History  Procedure Laterality Date  . Back surgery     History reviewed. No pertinent family history. Social History  Substance Use Topics  . Smoking status: Former Smoker -- 0.00 packs/day for 10 years    Types: Cigarettes  . Smokeless tobacco: None  . Alcohol Use: No   denies illicit drug use  Review of Systems  Constitutional: Negative.   HENT: Negative.   Respiratory: Positive for cough and shortness of breath.   Cardiovascular: Negative.   Gastrointestinal: Negative.   Musculoskeletal: Positive for gait problem.       Use wheelchair as becomes too dyspneic with walking  short distances  Skin: Negative.   Allergic/Immunologic: Positive for immunocompromised state.       Diabetic  Neurological: Negative.   Psychiatric/Behavioral: Negative.   All other systems reviewed and are negative.     Allergies  Review of patient's allergies indicates no known allergies.  Home Medications   Prior to Admission medications   Medication Sig Start Date End Date Taking? Authorizing Provider  albuterol (PROAIR HFA) 108 (90 BASE) MCG/ACT inhaler Inhale 2 puffs into the lungs every 6 (six) hours as needed. Shortness of Breath   Yes  Historical Provider, MD  ALPRAZolam (XANAX) 0.5 MG tablet Take 0.5 mg by mouth 3 (three) times daily as needed for anxiety.   Yes Historical Provider, MD  fexofenadine-pseudoephedrine (ALLEGRA-D 24) 180-240 MG 24 hr tablet Take 1 tablet by mouth daily.   Yes Historical Provider, MD  Guaifenesin (MUCINEX MAXIMUM STRENGTH) 1200 MG TB12 Take 1 tablet by mouth 2 (two) times daily.   Yes Historical Provider, MD  ibuprofen (ADVIL,MOTRIN) 800 MG tablet Take 800 mg by mouth daily as needed. For pain   Yes Historical Provider, MD  ipratropium-albuterol (DUONEB) 0.5-2.5 (3) MG/3ML SOLN Take 3 mLs by nebulization every 4 (four) hours as needed. For shortness of breath   Yes Historical Provider, MD  metFORMIN (GLUCOPHAGE) 1000 MG tablet Take 1 tablet (1,000 mg total) by mouth 2 (two) times daily. 06/08/11  Yes Angus McInnis, MD  montelukast (SINGULAIR) 10 MG tablet Take 10 mg by mouth at bedtime.     Yes Historical Provider, MD  amoxicillin (AMOXIL) 500 MG capsule Take 1 capsule (500 mg total) by mouth 3 (three) times daily. Patient not taking: Reported on 11/27/2015 08/06/15   Butch Penny, MD   oxygen 2.5 L BP 130/81 mmHg  Pulse 109  Temp(Src) 99.1 F (37.3 C) (Rectal)  Resp 24  Ht  (1.88 m)  Wt 176 lb (79.833 kg)  BMI 22.59 kg/m2  SpO2 96% Physical Exam  Constitutional: No distress.  Chronically ill-appearing  HENT:  Head: Normocephalic and atraumatic.  Eyes: Conjunctivae are normal. Pupils are equal, round, and reactive  to light.  Neck: Neck supple. No tracheal deviation present. No thyromegaly present.  Cardiovascular: Normal rate and regular rhythm.   No murmur heard. Pulmonary/Chest: Effort normal. No respiratory distress.  Diffuse coarse rhonchi and no retractions  Abdominal: Soft. Bowel sounds are normal. He exhibits no distension. There is no tenderness.  Musculoskeletal: Normal range of motion. He exhibits no edema or tenderness.  Neurological: He is alert. Coordination normal.   Skin: Skin is warm and dry. No rash noted.  Psychiatric: He has a normal mood and affect.  Nursing note and vitals reviewed.   ED Course  Procedures (including critical care time) Labs Review Labs Reviewed  BASIC METABOLIC PANEL  CBC WITH DIFFERENTIAL/PLATELET    Imaging Review No results found. I have personally reviewed and evaluated these images and lab results as part of my medical decision-making.   EKG Interpretation   Date/Time:  Thursday November 27 2015 12:00:56 EST Ventricular Rate:  107 PR Interval:  190 QRS Duration: 128 QT Interval:  338 QTC Calculation: 451 R Axis:   129 Text Interpretation:  Sinus tachycardia Right bundle branch block Abnormal  ECG No significant change since last tracing Confirmed by Halston Kintz  MD,  Kadien Lineman ([id number]) on 11/27/2015 12:26:52 PM     1:50 PM patient states breathing is improved while receiving nebulized treatment.  3 PM after one hour of continuous nebulization patient states his breathing is at baseline and he feels well to go home. Chest x-ray viewed by me. Results for orders placed or performed during the hospital encounter of 11/27/15  Basic metabolic panel  Result Value Ref Range   Sodium 134 (L) 135 - 145 mmol/L   Potassium 3.8 3.5 - 5.1 mmol/L   Chloride 96 (L) 101 - 111 mmol/L   CO2 31 22 - 32 mmol/L   Glucose, Bld 261 (H) 65 - 99 mg/dL   BUN 15 6 - 20 mg/dL   Creatinine, Ser 7.40 0.61 - 1.24 mg/dL   Calcium 9.0 8.9 - 81.4 mg/dL   GFR calc non Af Amer >60 >60 mL/min   GFR calc Af Amer >60 >60 mL/min   Anion gap 7 5 - 15  CBC with Differential/Platelet  Result Value Ref Range   WBC 6.6 4.0 - 10.5 K/uL   RBC 4.27 4.22 - 5.81 MIL/uL   Hemoglobin 12.3 (L) 13.0 - 17.0 g/dL   HCT 48.1 (L) 85.6 - 31.4 %   MCV 89.5 78.0 - 100.0 fL   MCH 28.8 26.0 - 34.0 pg   MCHC 32.2 30.0 - 36.0 g/dL   RDW 97.0 26.3 - 78.5 %   Platelets 254 150 - 400 K/uL   Neutrophils Relative % 60 %   Neutro Abs 3.9 1.7 - 7.7 K/uL    Lymphocytes Relative 31 %   Lymphs Abs 2.0 0.7 - 4.0 K/uL   Monocytes Relative 8 %   Monocytes Absolute 0.5 0.1 - 1.0 K/uL   Eosinophils Relative 1 %   Eosinophils Absolute 0.1 0.0 - 0.7 K/uL   Basophils Relative 0 %   Basophils Absolute 0.0 0.0 - 0.1 K/uL   Dg Chest 2 View  11/27/2015  CLINICAL DATA:  Shortness of breath for 3 days. Mid and right chest pain. EXAM: CHEST  2 VIEW COMPARISON:  08/03/2015 FINDINGS: There is hyperinflation and evidence for emphysematous changes. Few increased interstitial densities at the lateral right lung base compared to the prior examinations. Otherwise, no focal airspace disease. Heart and mediastinum are within normal limits. Bony  thorax is intact. IMPRESSION: Emphysematous changes. There may be a small focus of infection or inflammation at the right lung base. Electronically Signed   By: Richarda Overlie M.D.   On: 11/27/2015 14:30    MDM  We'll treat patient with antibiotic for remote possibility of community-acquired pneumonia, based on chest x-ray. Port score equals 71 patient amenable to outpatient therapy and breathing is at baseline. Plan prescription Zithromax he is instructed to use his albuterol nebulizer every 4 hours as needed for shortness of breath or cough. Return if they have more than every 4 hours or see his primary care physician Dx #1acute bronchitis. #2 hyperglycemia  Final diagnoses:  None        Doug Sou, MD 11/27/15 [id number]

## 2015-11-27 NOTE — Discharge Instructions (Signed)
Acute Bronchitis Use your albuterol nebulizer every 4 hours as needed for cough or shortness of breath. Return if the more than every 4 hours or see Dr.Knowlton in the office. Bronchitis is when the airways that extend from the windpipe into the lungs get red, puffy, and painful (inflamed). Bronchitis often causes thick spit (mucus) to develop. This leads to a cough. A cough is the most common symptom of bronchitis. In acute bronchitis, the condition usually begins suddenly and goes away over time (usually in 2 weeks). Smoking, allergies, and asthma can make bronchitis worse. Repeated episodes of bronchitis may cause more lung problems. HOME CARE  Rest.  Drink enough fluids to keep your pee (urine) clear or pale yellow (unless you need to limit fluids as told by your doctor).  Only take over-the-counter or prescription medicines as told by your doctor.  Avoid smoking and secondhand smoke. These can make bronchitis worse. If you are a smoker, think about using nicotine gum or skin patches. Quitting smoking will help your lungs heal faster.  Reduce the chance of getting bronchitis again by:  Washing your hands often.  Avoiding people with cold symptoms.  Trying not to touch your hands to your mouth, nose, or eyes.  Follow up with your doctor as told. GET HELP IF: Your symptoms do not improve after 1 week of treatment. Symptoms include:  Cough.  Fever.  Coughing up thick spit.  Body aches.  Chest congestion.  Chills.  Shortness of breath.  Sore throat. GET HELP RIGHT AWAY IF:   You have an increased fever.  You have chills.  You have severe shortness of breath.  You have bloody thick spit (sputum).  You throw up (vomit) often.  You lose too much body fluid (dehydration).  You have a severe headache.  You faint. MAKE SURE YOU:   Understand these instructions.  Will watch your condition.  Will get help right away if you are not doing well or get worse.     This information is not intended to replace advice given to you by your health care provider. Make sure you discuss any questions you have with your health care provider.   Document Released: 03/08/2008 Document Revised: 05/23/2013 Document Reviewed: 03/13/2013 Elsevier Interactive Patient Education [employer]

## 2015-11-28 ENCOUNTER — Other Ambulatory Visit (HOSPITAL_COMMUNITY)
Admission: RE | Admit: 2015-11-28 | Discharge: 2015-11-28 | Disposition: A | Discharge status: Home / Self Care | Payer: Medicare HMO | Source: Ambulatory Visit | Attending: Family Medicine | Admitting: Family Medicine

## 2015-11-28 DIAGNOSIS — J449 Chronic obstructive pulmonary disease, unspecified: Secondary | ICD-10-CM | POA: Diagnosis present

## 2015-11-28 DIAGNOSIS — E119 Type 2 diabetes mellitus without complications: Secondary | ICD-10-CM | POA: Insufficient documentation

## 2015-11-28 DIAGNOSIS — Z125 Encounter for screening for malignant neoplasm of prostate: Secondary | ICD-10-CM | POA: Insufficient documentation

## 2015-11-28 DIAGNOSIS — E7800 Pure hypercholesterolemia, unspecified: Secondary | ICD-10-CM | POA: Insufficient documentation

## 2015-11-28 LAB — COMPREHENSIVE METABOLIC PANEL
ALBUMIN: 3.9 g/dL (ref 3.5–5.0)
ALK PHOS: 62 U/L (ref 38–126)
ALT: 17 U/L (ref 17–63)
ANION GAP: 10 (ref 5–15)
AST: 18 U/L (ref 15–41)
BUN: 10 mg/dL (ref 6–20)
CALCIUM: 9.8 mg/dL (ref 8.9–10.3)
CO2: 32 mmol/L (ref 22–32)
CREATININE: 0.92 mg/dL (ref 0.61–1.24)
Chloride: 98 mmol/L — ABNORMAL LOW (ref 101–111)
GFR calc Af Amer: >60 mL/min (ref >60)
GFR calc non Af Amer: >60 mL/min (ref >60)
GLUCOSE: 131 mg/dL — AB (ref 65–99)
POTASSIUM: 4.3 mmol/L (ref 3.5–5.1)
SODIUM: 140 mmol/L (ref 135–145)
TOTAL PROTEIN: 7.3 g/dL (ref 6.5–8.1)
Total Bilirubin: 0.4 mg/dL (ref 0.3–1.2)

## 2015-11-28 LAB — CBC WITH DIFFERENTIAL/PLATELET
BASOS ABS: 0 10*3/uL (ref 0.0–0.1)
BASOS PCT: 0 %
EOS ABS: 0.2 10*3/uL (ref 0.0–0.7)
Eosinophils Relative: 3 %
HCT: 37.5 % — ABNORMAL LOW (ref 39.0–52.0)
HEMOGLOBIN: 12.2 g/dL — AB (ref 13.0–17.0)
Lymphocytes Relative: 34 %
Lymphs Abs: 1.8 10*3/uL (ref 0.7–4.0)
MCH: 29.3 pg (ref 26.0–34.0)
MCHC: 32.5 g/dL (ref 30.0–36.0)
MCV: 89.9 fL (ref 78.0–100.0)
MONOS PCT: 11 %
Monocytes Absolute: 0.6 10*3/uL (ref 0.1–1.0)
NEUTROS PCT: 53 %
Neutro Abs: 2.9 10*3/uL (ref 1.7–7.7)
Platelets: 249 10*3/uL (ref 150–400)
RBC: 4.17 MIL/uL — ABNORMAL LOW (ref 4.22–5.81)
RDW: 13 % (ref 11.5–15.5)
WBC: 5.4 10*3/uL (ref 4.0–10.5)

## 2015-11-28 LAB — LIPID PANEL
CHOLESTEROL: 241 mg/dL — AB (ref 0–200)
HDL: 57 mg/dL (ref >40)
LDL Cholesterol: 170 mg/dL — ABNORMAL HIGH (ref 0–99)
TRIGLYCERIDES: 68 mg/dL (ref <150)
Total CHOL/HDL Ratio: 4.2 RATIO
VLDL: 14 mg/dL (ref 0–40)

## 2015-11-28 LAB — TSH: TSH: 0.944 u[IU]/mL (ref 0.350–4.500)

## 2015-11-29 LAB — PSA, TOTAL AND FREE
PSA, Free Pct: 3.8 %
PSA, Free: 0.38 ng/mL
Prostate Specific Ag, Serum: 9.9 ng/mL — ABNORMAL HIGH (ref 0.0–4.0)

## 2015-12-01 LAB — HEMOGLOBIN A1C
HEMOGLOBIN A1C: 7 % — AB (ref 4.8–5.6)
MEAN PLASMA GLUCOSE: 154 mg/dL

## 2016-02-02 DIAGNOSIS — Z9289 Personal history of other medical treatment: Secondary | ICD-10-CM

## 2016-02-02 HISTORY — DX: Personal history of other medical treatment: Z92.89

## 2016-02-17 ENCOUNTER — Encounter: Payer: Medicare HMO | Admitting: Cardiology

## 2016-02-17 NOTE — Progress Notes (Signed)
Patient ID: Jeff Ray, male   DOB: 12/06/1955, 60 y.o.   MRN: [id number]     Clinical Summary Jeff Ray is a 60 y.o.male seen today as a new patient, he is referred by Dr Katharine LookSteven Knowlton for the following medical problems.  1. Chest pain   Past Medical History  Diagnosis Date  . COPD (chronic obstructive pulmonary disease) (HCC)   . Asthma   . Hypertension   . Anxiety   . Diabetes mellitus      No Known Allergies   Current Outpatient Prescriptions  Medication Sig Dispense Refill  . albuterol (PROAIR HFA) 108 (90 BASE) MCG/ACT inhaler Inhale 2 puffs into the lungs every 6 (six) hours as needed. Shortness of Breath    . ALPRAZolam (XANAX) 0.5 MG tablet Take 0.5 mg by mouth 3 (three) times daily as needed for anxiety.    [name]. amoxicillin (AMOXIL) 500 MG capsule Take 1 capsule (500 mg total) by mouth 3 (three) times daily. (Patient not taking: Reported on 11/27/2015) 21 capsule 1  . azithromycin (ZITHROMAX) 250 MG tablet Take 1 tablet (250 mg total) by mouth daily. 1 tablet daily starting 11/28/15 4 tablet 0  . fexofenadine-pseudoephedrine (ALLEGRA-D 24) 180-240 MG 24 hr tablet Take 1 tablet by mouth daily.    . Guaifenesin (MUCINEX MAXIMUM STRENGTH) 1200 MG TB12 Take 1 tablet by mouth 2 (two) times daily.    [name]. ibuprofen (ADVIL,MOTRIN) 800 MG tablet Take 800 mg by mouth daily as needed. For pain    . ipratropium-albuterol (DUONEB) 0.5-2.5 (3) MG/3ML SOLN Take 3 mLs by nebulization every 4 (four) hours as needed. For shortness of breath    . metFORMIN (GLUCOPHAGE) 1000 MG tablet Take 1 tablet (1,000 mg total) by mouth 2 (two) times daily. 60 tablet 5  . montelukast (SINGULAIR) 10 MG tablet Take 10 mg by mouth at bedtime.       No current facility-administered medications for this visit.     Past Surgical History  Procedure Laterality Date  . Back surgery       No Known Allergies    No family history on file.   Social History Jeff Ray reports that he has quit smoking.  His smoking use included Cigarettes. He smoked 0.00 packs per day for 10 years. He does not have any smokeless tobacco history on file. Jeff Ray reports that he does not drink alcohol.   Review of Systems CONSTITUTIONAL: No weight loss, fever, chills, weakness or fatigue.  HEENT: Eyes: No visual loss, blurred vision, double vision or yellow sclerae.No hearing loss, sneezing, congestion, runny nose or sore throat.  SKIN: No rash or itching.  CARDIOVASCULAR:  RESPIRATORY: No shortness of breath, cough or sputum.  GASTROINTESTINAL: No anorexia, nausea, vomiting or diarrhea. No abdominal pain or blood.  GENITOURINARY: No burning on urination, no polyuria NEUROLOGICAL: No headache, dizziness, syncope, paralysis, ataxia, numbness or tingling in the extremities. No change in bowel or bladder control.  MUSCULOSKELETAL: No muscle, back pain, joint pain or stiffness.  LYMPHATICS: No enlarged nodes. No history of splenectomy.  PSYCHIATRIC: No history of depression or anxiety.  ENDOCRINOLOGIC: No reports of sweating, cold or heat intolerance. No polyuria or polydipsia.  [name].   Physical Examination There were no vitals filed for this visit. There were no vitals filed for this visit.  Gen: resting comfortably, no acute distress HEENT: no scleral icterus, pupils equal round and reactive, no palptable cervical adenopathy,  CV Resp: Clear to auscultation bilaterally GI: abdomen is soft, non-tender,  non-distended, normal bowel sounds, no hepatosplenomegaly MSK: extremities are warm, no edema.  Skin: warm, no rash Neuro:  no focal deficits Psych: appropriate affect   Diagnostic Studies     Assessment and Plan        Antoine PocheJonathan F. Samhitha Rosen, M.D., F.A.C.C.

## 2016-02-18 ENCOUNTER — Ambulatory Visit (INDEPENDENT_AMBULATORY_CARE_PROVIDER_SITE_OTHER): Payer: Medicare HMO | Admitting: Cardiology

## 2016-02-18 ENCOUNTER — Encounter: Payer: Self-pay | Admitting: Cardiology

## 2016-02-18 VITALS — BP 120/64 | HR 99 | Ht 71.0 in | Wt 184.0 lb

## 2016-02-18 DIAGNOSIS — R079 Chest pain, unspecified: Secondary | ICD-10-CM | POA: Diagnosis not present

## 2016-02-18 MED ORDER — PRAVASTATIN SODIUM 20 MG PO TABS: 20.0000 mg | ORAL_TABLET | Freq: Every evening | ORAL | Status: DC

## 2016-02-18 NOTE — Progress Notes (Addendum)
Patient ID: Jeff Ray, male   DOB: 11/12/1955, 60 y.o.   MRN: [id number]     Clinical Summary Jeff Ray is a 60 y.o.male seen today as a new patient, he is referred by Dr Katharine LookSteven Knowlton for the following medical problems.  1. Chest pain - started approx 3 months ago. Dull burning pain midchest, 7-8/10. Can occur at rest or with exertion. Can feel anxious, no other associated symptoms. Not positional. Taking deep breaths can improve symptoms. Can last about 1-2 minutes. Occurs daily. Stable frequency, has had some increase severity. No relation to food.  - denies any LE edema   CAD risk factors: tobacco, hyperlipidemia, DM2,    2. COPD - on home O2, wheelchair bound  Past Medical History  Diagnosis Date  . COPD (chronic obstructive pulmonary disease) (HCC)   . Asthma   . Hypertension   . Anxiety   . Diabetes mellitus      No Known Allergies   Current Outpatient Prescriptions  Medication Sig Dispense Refill  . albuterol (PROAIR HFA) 108 (90 BASE) MCG/ACT inhaler Inhale 2 puffs into the lungs every 6 (six) hours as needed. Shortness of Breath    . ALPRAZolam (XANAX) 0.5 MG tablet Take 0.5 mg by mouth 3 (three) times daily as needed for anxiety.    [name]. amoxicillin (AMOXIL) 500 MG capsule Take 1 capsule (500 mg total) by mouth 3 (three) times daily. (Patient not taking: Reported on 11/27/2015) 21 capsule 1  . azithromycin (ZITHROMAX) 250 MG tablet Take 1 tablet (250 mg total) by mouth daily. 1 tablet daily starting 11/28/15 4 tablet 0  . fexofenadine-pseudoephedrine (ALLEGRA-D 24) 180-240 MG 24 hr tablet Take 1 tablet by mouth daily.    . Guaifenesin (MUCINEX MAXIMUM STRENGTH) 1200 MG TB12 Take 1 tablet by mouth 2 (two) times daily.    [name]. ibuprofen (ADVIL,MOTRIN) 800 MG tablet Take 800 mg by mouth daily as needed. For pain    . ipratropium-albuterol (DUONEB) 0.5-2.5 (3) MG/3ML SOLN Take 3 mLs by nebulization every 4 (four) hours as needed. For shortness of breath    . metFORMIN  (GLUCOPHAGE) 1000 MG tablet Take 1 tablet (1,000 mg total) by mouth 2 (two) times daily. 60 tablet 5  . montelukast (SINGULAIR) 10 MG tablet Take 10 mg by mouth at bedtime.       No current facility-administered medications for this visit.     Past Surgical History  Procedure Laterality Date  . Back surgery       No Known Allergies    No family history on file.   Social History Jeff Ray reports that he has quit smoking. His smoking use included Cigarettes. He smoked 0.00 packs per day for 10 years. He does not have any smokeless tobacco history on file. Jeff Ray reports that he does not drink alcohol.   Review of Systems CONSTITUTIONAL: No weight loss, fever, chills, weakness or fatigue.  HEENT: Eyes: No visual loss, blurred vision, double vision or yellow sclerae.No hearing loss, sneezing, congestion, runny nose or sore throat.  SKIN: No rash or itching.  CARDIOVASCULAR: per HPI RESPIRATORY: No shortness of breath, cough or sputum.  GASTROINTESTINAL: No anorexia, nausea, vomiting or diarrhea. No abdominal pain or blood.  GENITOURINARY: No burning on urination, no polyuria NEUROLOGICAL: No headache, dizziness, syncope, paralysis, ataxia, numbness or tingling in the extremities. No change in bowel or bladder control.  MUSCULOSKELETAL: No muscle, back pain, joint pain or stiffness.  LYMPHATICS: No enlarged nodes. No history of splenectomy.  PSYCHIATRIC: No history of depression or anxiety.  ENDOCRINOLOGIC: No reports of sweating, cold or heat intolerance. No polyuria or polydipsia.  [name]   Physical Examination Filed Vitals:   02/18/16 1133  BP: 120/64  Pulse: 99   Filed Vitals:   02/18/16 1133  Height:  (1.803 m)  Weight: 184 lb (83.462 kg)    Gen: resting comfortably, no acute distress HEENT: no scleral icterus, pupils equal round and reactive, no palptable cervical adenopathy,  CV: RRR, no m/r/, no jvd Resp: bilateral wheezing GI: abdomen is soft,  non-tender, non-distended, normal bowel sounds, no hepatosplenomegaly MSK: extremities are warm, no edema.  Skin: warm, no rash Neuro:  no focal deficits Psych: appropriate affect    Assessment and Plan  1. Chest pain - unclear etiology, he does have multiple CAD risk factors - EKG in clinic sinus tach with RBBB, LPFB -wheel chair bound, he is unable to exercise. With O2 dependent COPD and wheezing will avoid Lexiscan, will obtain dobutamine nuclear stress test    2. Hyperlipidemia - abdominal pain on crestor in the past, we will try pravastatin  daily    Antoine Poche, M.D.

## 2016-02-18 NOTE — Patient Instructions (Signed)
Medication Instructions:  Start pravastatin 20 mg daily  Labwork: none  Testing/Procedures: Your physician has requested that you have a dobutamine myoview. For furth information please visit [email or web address] Please follow instruction sheet, as given.    Follow-Up: Your physician recommends that you schedule a follow-up appointment in: to be determined    Any Other Special Instructions Will Be Listed Below (If Applicable).     If you need a refill on your cardiac medications before your next appointment, please call your pharmacy.

## 2016-02-23 ENCOUNTER — Encounter (HOSPITAL_COMMUNITY): Payer: Self-pay

## 2016-02-23 ENCOUNTER — Encounter (HOSPITAL_COMMUNITY)
Admission: RE | Admit: 2016-02-23 | Discharge: 2016-02-23 | Disposition: A | Discharge status: Home / Self Care | Payer: Medicare HMO | Source: Ambulatory Visit | Attending: Cardiology | Admitting: Cardiology

## 2016-02-23 ENCOUNTER — Inpatient Hospital Stay (HOSPITAL_COMMUNITY): Admission: RE | Admit: 2016-02-23 | Payer: Medicare HMO | Source: Ambulatory Visit

## 2016-02-23 DIAGNOSIS — I493 Ventricular premature depolarization: Secondary | ICD-10-CM | POA: Insufficient documentation

## 2016-02-23 DIAGNOSIS — R0602 Shortness of breath: Secondary | ICD-10-CM | POA: Insufficient documentation

## 2016-02-23 DIAGNOSIS — R079 Chest pain, unspecified: Secondary | ICD-10-CM | POA: Diagnosis present

## 2016-02-23 LAB — NM MYOCAR MULTI W/SPECT W/WALL MOTION / EF
CHL CUP RESTING HR STRESS: 93 {beats}/min
CSEPPHR: 153 {beats}/min
LV dias vol: 75 mL (ref 62–150)
LVSYSVOL: 29 mL
MPHR: 159 {beats}/min
NUC STRESS TID: 1.22
Percent HR: 96 %
RATE: 0.26
SDS: 0
SRS: 2
SSS: 2

## 2016-02-23 MED ORDER — TECHNETIUM TC 99M TETROFOSMIN IV KIT
30.0000 | PACK | Freq: Once | INTRAVENOUS | Status: AC | PRN
  Administered 2016-02-23: 30 via INTRAVENOUS

## 2016-02-23 MED ORDER — TECHNETIUM TC 99M TETROFOSMIN IV KIT
10.0000 | PACK | Freq: Once | INTRAVENOUS | Status: AC | PRN
  Administered 2016-02-23: 10.5 via INTRAVENOUS

## 2016-02-23 MED ORDER — DOBUTAMINE IN D5W 4-5 MG/ML-% IV SOLN
INTRAVENOUS | Status: AC
  Administered 2016-02-23 (×3): via INTRAVENOUS
  Administered 2016-02-23: 1000000 ug via INTRAVENOUS
  Filled 2016-02-23: qty 250

## 2016-02-24 ENCOUNTER — Telehealth: Payer: Self-pay

## 2016-02-24 DIAGNOSIS — R079 Chest pain, unspecified: Secondary | ICD-10-CM | POA: Diagnosis not present

## 2016-02-24 NOTE — Telephone Encounter (Signed)
Pt given results of Nuc med test,copy to pcp sent

## 2016-04-20 ENCOUNTER — Ambulatory Visit (INDEPENDENT_AMBULATORY_CARE_PROVIDER_SITE_OTHER): Payer: Medicare HMO | Admitting: Cardiology

## 2016-04-20 ENCOUNTER — Encounter: Payer: Self-pay | Admitting: Cardiology

## 2016-04-20 VITALS — BP 138/68 | HR 100 | Ht 72.0 in | Wt 192.0 lb

## 2016-04-20 DIAGNOSIS — J449 Chronic obstructive pulmonary disease, unspecified: Secondary | ICD-10-CM | POA: Diagnosis not present

## 2016-04-20 DIAGNOSIS — R0789 Other chest pain: Secondary | ICD-10-CM | POA: Diagnosis not present

## 2016-04-20 DIAGNOSIS — E785 Hyperlipidemia, unspecified: Secondary | ICD-10-CM

## 2016-04-20 NOTE — Progress Notes (Signed)
Clinical Summary Jeff Ray is a 60 y.o.male seen today for follow up of the following medical problems.   1. Chest pain - last visit he reported symptoms of chest pain CAD risk factors: tobacco, hyperlipidemia, DM2,   - since last visit he completed a dobutamine nuclear stress which showed probable inferior artifact, no signiciant ischemia. Overall low risk.  - chest pain symptoms have improved.    2. COPD - on home O2. Compliant with inhalers  3. Hyperlipidemia - side effects on crestor, changed to pravastatin last visit, tolerating well.  Past Medical History  Diagnosis Date  . COPD (chronic obstructive pulmonary disease) (HCC)   . Asthma   . Hypertension   . Anxiety   . Diabetes mellitus      No Known Allergies   Current Outpatient Prescriptions  Medication Sig Dispense Refill  . albuterol (PROAIR HFA) 108 (90 BASE) MCG/ACT inhaler Inhale 2 puffs into the lungs every 6 (six) hours as needed. Shortness of Breath    . ALPRAZolam (XANAX) 0.5 MG tablet Take 0.5 mg by mouth 3 (three) times daily as needed for anxiety.    . fexofenadine-pseudoephedrine (ALLEGRA-D 24) 180-240 MG 24 hr tablet Take 1 tablet by mouth daily.    . Guaifenesin (MUCINEX MAXIMUM STRENGTH) 1200 MG TB12 Take 1 tablet by mouth 2 (two) times daily.    [name] ibuprofen (ADVIL,MOTRIN) 800 MG tablet Take 800 mg by mouth daily as needed. For pain    . ipratropium-albuterol (DUONEB) 0.5-2.5 (3) MG/3ML SOLN Take 3 mLs by nebulization every 4 (four) hours as needed. For shortness of breath    . montelukast (SINGULAIR) 10 MG tablet Take 10 mg by mouth at bedtime.      . pravastatin (PRAVACHOL) 20 MG tablet Take 1 tablet (20 mg total) by mouth every evening. 90 tablet 3  . sulfamethoxazole-trimethoprim (BACTRIM DS,SEPTRA DS) 800-160 MG tablet Take 800 tablets by mouth 2 (two) times daily.     No current facility-administered medications for this visit.     Past Surgical History  Procedure Laterality Date  .  Back surgery       No Known Allergies    No family history on file.   Social History Jeff Ray reports that he quit smoking about 3 years ago. His smoking use included Cigarettes. He smoked 0.00 packs per day for 10 years. He does not have any smokeless tobacco history on file. Jeff Ray reports that he does not drink alcohol.   Review of Systems CONSTITUTIONAL: No weight loss, fever, chills, weakness or fatigue.  HEENT: Eyes: No visual loss, blurred vision, double vision or yellow sclerae.No hearing loss, sneezing, congestion, runny nose or sore throat.  SKIN: No rash or itching.  CARDIOVASCULAR: per HPI RESPIRATORY: chronic SOB GASTROINTESTINAL: No anorexia, nausea, vomiting or diarrhea. No abdominal pain or blood.  GENITOURINARY: No burning on urination, no polyuria NEUROLOGICAL: No headache, dizziness, syncope, paralysis, ataxia, numbness or tingling in the extremities. No change in bowel or bladder control.  MUSCULOSKELETAL: No muscle, back pain, joint pain or stiffness.  LYMPHATICS: No enlarged nodes. No history of splenectomy.  PSYCHIATRIC: No history of depression or anxiety.  ENDOCRINOLOGIC: No reports of sweating, cold or heat intolerance. No polyuria or polydipsia.  [name]   Physical Examination Filed Vitals:   04/20/16 1520  BP: 138/68  Pulse: 100   Filed Vitals:   04/20/16 1520  Height: 6' (1.829 m)  Weight: 192 lb (87.091 kg)    Gen: resting comfortably,  no acute distress HEENT: no scleral icterus, pupils equal round and reactive, no palptable cervical adenopathy,  CV: RRR, no m/r/g, no jvd Resp: Clear to auscultation bilaterally GI: abdomen is soft, non-tender, non-distended, normal bowel sounds, no hepatosplenomegaly MSK: extremities are warm, no edema.  Skin: warm, no rash Neuro:  no focal deficits Psych: appropriate affect   Diagnostic Studies 02/2016 Dobutamine Nuclear Stress  No diagnostic ST segment changes noted during dobutamine infusion.  Rare PVC.  Small to medium sized, mild to moderate intensity, inferior defect that is fixed, most prominent at rest and suggestive of attenuation artifact, less likely scar. No large ischemic zones are noted.  This is a low risk study.  Nuclear stress EF: 61%.          Assessment and Plan  1. Chest pain - dobutamine nuclear stress without ischemia. - symptoms improved since last visit. Recommended trial of zantac if reoccur   2. Hyperlipidemia - abdominal pain on crestor in the past. Tolerating pravastatin, will continue  3. COPD - advanced symptoms, he is on home O2. Only on prn inhalers, will refer to Dr Juanetta GoslingHawkins for consideration for intensified regimen.       Jeff PocheJonathan F. Chiante Ray, M.D.

## 2016-04-20 NOTE — Patient Instructions (Signed)
Your physician wants you to follow-up in: 6 MONTHS WITH DR. BRANCH You will receive a reminder letter in the mail two months in advance. If you don't receive a letter, please call our office to schedule the follow-up appointment.  Your physician recommends that you continue on your current medications as directed. Please refer to the Current Medication list given to you today.  TAKE OTC ZANTAC 150 MG TWICE DAILY  You have been referred to DR. HAWKINS   Thank you for choosing [facility]!!

## 2016-04-21 ENCOUNTER — Ambulatory Visit: Payer: Medicare HMO | Admitting: Urology

## 2016-05-05 ENCOUNTER — Ambulatory Visit (INDEPENDENT_AMBULATORY_CARE_PROVIDER_SITE_OTHER): Payer: Medicare HMO | Admitting: Urology

## 2016-05-05 DIAGNOSIS — N401 Enlarged prostate with lower urinary tract symptoms: Secondary | ICD-10-CM | POA: Diagnosis not present

## 2016-05-05 DIAGNOSIS — N411 Chronic prostatitis: Secondary | ICD-10-CM | POA: Diagnosis not present

## 2016-05-05 DIAGNOSIS — R351 Nocturia: Secondary | ICD-10-CM | POA: Diagnosis not present

## 2016-05-26 ENCOUNTER — Encounter (HOSPITAL_COMMUNITY): Payer: Self-pay

## 2016-05-26 DIAGNOSIS — E119 Type 2 diabetes mellitus without complications: Secondary | ICD-10-CM | POA: Insufficient documentation

## 2016-05-26 DIAGNOSIS — J449 Chronic obstructive pulmonary disease, unspecified: Secondary | ICD-10-CM | POA: Insufficient documentation

## 2016-05-26 DIAGNOSIS — J45909 Unspecified asthma, uncomplicated: Secondary | ICD-10-CM | POA: Diagnosis not present

## 2016-05-26 DIAGNOSIS — Y999 Unspecified external cause status: Secondary | ICD-10-CM | POA: Diagnosis not present

## 2016-05-26 DIAGNOSIS — S90562A Insect bite (nonvenomous), left ankle, initial encounter: Secondary | ICD-10-CM | POA: Insufficient documentation

## 2016-05-26 DIAGNOSIS — Y929 Unspecified place or not applicable: Secondary | ICD-10-CM | POA: Insufficient documentation

## 2016-05-26 DIAGNOSIS — Y939 Activity, unspecified: Secondary | ICD-10-CM | POA: Diagnosis not present

## 2016-05-26 DIAGNOSIS — W57XXXA Bitten or stung by nonvenomous insect and other nonvenomous arthropods, initial encounter: Secondary | ICD-10-CM | POA: Diagnosis not present

## 2016-05-26 DIAGNOSIS — I10 Essential (primary) hypertension: Secondary | ICD-10-CM | POA: Diagnosis not present

## 2016-05-26 NOTE — ED Triage Notes (Signed)
Patient presented to ED with a tick to left outter ankle. No redness, swelling or fevers at this time.  Just itching at site.

## 2016-05-27 ENCOUNTER — Emergency Department (HOSPITAL_COMMUNITY)
Admission: EM | Admit: 2016-05-27 | Discharge: 2016-05-27 | Disposition: A | Discharge status: Home / Self Care | Payer: Medicare HMO | Attending: Dermatology | Admitting: Dermatology

## 2016-05-27 NOTE — ED Notes (Signed)
Pt told registration clerk he was leaving and exited the e.r. Waiting area

## 2016-06-16 ENCOUNTER — Ambulatory Visit: Payer: Medicare HMO | Admitting: Urology

## 2016-06-17 NOTE — Progress Notes (Signed)
This encounter was created in error - please disregard.

## 2016-07-14 ENCOUNTER — Ambulatory Visit (INDEPENDENT_AMBULATORY_CARE_PROVIDER_SITE_OTHER): Payer: Medicare HMO | Admitting: Urology

## 2016-07-14 DIAGNOSIS — N401 Enlarged prostate with lower urinary tract symptoms: Secondary | ICD-10-CM | POA: Diagnosis not present

## 2016-07-14 DIAGNOSIS — N5201 Erectile dysfunction due to arterial insufficiency: Secondary | ICD-10-CM | POA: Diagnosis not present

## 2016-07-14 DIAGNOSIS — R351 Nocturia: Secondary | ICD-10-CM | POA: Diagnosis not present

## 2016-07-14 DIAGNOSIS — N411 Chronic prostatitis: Secondary | ICD-10-CM | POA: Diagnosis not present

## 2016-08-25 ENCOUNTER — Ambulatory Visit: Payer: Medicare HMO | Admitting: Urology

## 2016-09-17 ENCOUNTER — Emergency Department (HOSPITAL_COMMUNITY)
Admission: EM | Admit: 2016-09-17 | Discharge: 2016-09-17 | Disposition: A | Discharge status: Home / Self Care | Payer: Medicare HMO | Attending: Emergency Medicine | Admitting: Emergency Medicine

## 2016-09-17 ENCOUNTER — Emergency Department (HOSPITAL_COMMUNITY): Payer: Medicare HMO

## 2016-09-17 ENCOUNTER — Encounter (HOSPITAL_COMMUNITY): Payer: Self-pay | Admitting: Emergency Medicine

## 2016-09-17 DIAGNOSIS — J441 Chronic obstructive pulmonary disease with (acute) exacerbation: Secondary | ICD-10-CM | POA: Diagnosis not present

## 2016-09-17 DIAGNOSIS — Z87891 Personal history of nicotine dependence: Secondary | ICD-10-CM | POA: Diagnosis not present

## 2016-09-17 DIAGNOSIS — R0602 Shortness of breath: Secondary | ICD-10-CM | POA: Diagnosis present

## 2016-09-17 DIAGNOSIS — Z791 Long term (current) use of non-steroidal anti-inflammatories (NSAID): Secondary | ICD-10-CM | POA: Insufficient documentation

## 2016-09-17 DIAGNOSIS — J45909 Unspecified asthma, uncomplicated: Secondary | ICD-10-CM | POA: Insufficient documentation

## 2016-09-17 DIAGNOSIS — Z79899 Other long term (current) drug therapy: Secondary | ICD-10-CM | POA: Diagnosis not present

## 2016-09-17 DIAGNOSIS — E119 Type 2 diabetes mellitus without complications: Secondary | ICD-10-CM | POA: Insufficient documentation

## 2016-09-17 DIAGNOSIS — I10 Essential (primary) hypertension: Secondary | ICD-10-CM | POA: Diagnosis not present

## 2016-09-17 HISTORY — DX: Personal history of other medical treatment: Z92.89

## 2016-09-17 LAB — BASIC METABOLIC PANEL
Anion gap: 10 (ref 5–15)
BUN: 8 mg/dL (ref 6–20)
CALCIUM: 9 mg/dL (ref 8.9–10.3)
CO2: 27 mmol/L (ref 22–32)
Chloride: 94 mmol/L — ABNORMAL LOW (ref 101–111)
Creatinine, Ser: 1.1 mg/dL (ref 0.61–1.24)
GFR calc Af Amer: >60 mL/min (ref >60)
GLUCOSE: 216 mg/dL — AB (ref 65–99)
Potassium: 4.2 mmol/L (ref 3.5–5.1)
Sodium: 131 mmol/L — ABNORMAL LOW (ref 135–145)

## 2016-09-17 LAB — CBC WITH DIFFERENTIAL/PLATELET
BASOS PCT: 0 %
Basophils Absolute: 0 10*3/uL (ref 0.0–0.1)
EOS ABS: 0 10*3/uL (ref 0.0–0.7)
Eosinophils Relative: 0 %
HCT: 39.6 % (ref 39.0–52.0)
HEMOGLOBIN: 13.1 g/dL (ref 13.0–17.0)
Lymphocytes Relative: 8 %
Lymphs Abs: 1 10*3/uL (ref 0.7–4.0)
MCH: 29.4 pg (ref 26.0–34.0)
MCHC: 33.1 g/dL (ref 30.0–36.0)
MCV: 88.8 fL (ref 78.0–100.0)
MONO ABS: 1 10*3/uL (ref 0.1–1.0)
MONOS PCT: 8 %
NEUTROS PCT: 84 %
Neutro Abs: 10.8 10*3/uL — ABNORMAL HIGH (ref 1.7–7.7)
PLATELETS: 223 10*3/uL (ref 150–400)
RBC: 4.46 MIL/uL (ref 4.22–5.81)
RDW: 12.4 % (ref 11.5–15.5)
WBC: 12.8 10*3/uL — ABNORMAL HIGH (ref 4.0–10.5)

## 2016-09-17 LAB — D-DIMER, QUANTITATIVE (NOT AT ARMC): D DIMER QUANT: 0.43 ug{FEU}/mL (ref 0.00–0.50)

## 2016-09-17 LAB — TROPONIN I: Troponin I: 0.06 ng/mL (ref <0.03)

## 2016-09-17 MED ORDER — IPRATROPIUM BROMIDE 0.02 % IN SOLN
1.0000 mg | Freq: Once | RESPIRATORY_TRACT | Status: AC
  Administered 2016-09-17: 1 mg via RESPIRATORY_TRACT
  Filled 2016-09-17: qty 5

## 2016-09-17 MED ORDER — PREDNISONE 20 MG PO TABS: 40.0000 mg | ORAL_TABLET | Freq: Every day | ORAL | 0 refills | Status: DC

## 2016-09-17 MED ORDER — METHYLPREDNISOLONE SODIUM SUCC 125 MG IJ SOLR
125.0000 mg | Freq: Once | INTRAMUSCULAR | Status: AC
  Administered 2016-09-17: 125 mg via INTRAVENOUS
  Filled 2016-09-17: qty 2

## 2016-09-17 MED ORDER — DOXYCYCLINE HYCLATE 100 MG PO TABS: 100.0000 mg | ORAL_TABLET | Freq: Two times a day (BID) | ORAL | 0 refills | Status: DC

## 2016-09-17 MED ORDER — ALBUTEROL (5 MG/ML) CONTINUOUS INHALATION SOLN
10.0000 mg/h | INHALATION_SOLUTION | Freq: Once | RESPIRATORY_TRACT | Status: AC
  Administered 2016-09-17: 10 mg/h via RESPIRATORY_TRACT
  Filled 2016-09-17: qty 20

## 2016-09-17 NOTE — ED Provider Notes (Signed)
[facility]Y DEPT Provider Note   CSN: [id number] Arrival date & time: 09/17/16  1723     History   Chief Complaint Chief Complaint  Patient presents with  . Shortness of Breath    HPI Jeff Ray is a 60 y.o. male.  HPI  Per pt, c/o gradual onset and worsening of persistent cough and SOB for the past 3 days. Has been using home O2, MDI and nebs without relief. Endorses constant chronic left neck, chest and shoulder "pains" for the past 1 month.  Denies any change in these symptoms. Denies palpitations, no back pain, no abd pain, no N/V/D, no fevers, no rash.    Past Medical History:  Diagnosis Date  . Anxiety   . Asthma   . COPD (chronic obstructive pulmonary disease) (HCC)   . Diabetes mellitus   . History of nuclear stress test 02/2016   low risk study  . Hypertension     Patient Active Problem List   Diagnosis Date Noted  . Anemia 08/03/2015  . CAP (community acquired pneumonia) 06/02/2013  . Respiratory failure, acute and chronic (HCC) 05/26/2013  . COPD exacerbation (HCC) 05/26/2013  . HTN (hypertension) 05/26/2013  . DM (diabetes mellitus) (HCC) 05/26/2013  . Tobacco abuse 05/26/2013    Past Surgical History:  Procedure Laterality Date  . BACK SURGERY         Home Medications    Prior to Admission medications   Medication Sig Start Date End Date Taking? Authorizing Provider  albuterol (PROAIR HFA) 108 (90 BASE) MCG/ACT inhaler Inhale 2 puffs into the lungs every 6 (six) hours as needed. Shortness of Breath    Historical Provider, MD  ALPRAZolam (XANAX) 0.5 MG tablet Take 0.5 mg by mouth 3 (three) times daily as needed for anxiety.    Historical Provider, MD  fexofenadine-pseudoephedrine (ALLEGRA-D 24) 180-240 MG 24 hr tablet Take 1 tablet by mouth daily.    Historical Provider, MD  Guaifenesin [facility](MUCINEX MAXIMUM STRENGTH) 1200 MG TB12 Take 1 tablet by mouth 2 (two) times daily.    Historical Provider, MD  ibuprofen (ADVIL,MOTRIN) 800 MG tablet  Take 800 mg by mouth daily as needed. For pain    Historical Provider, MD  ipratropium-albuterol (DUONEB) 0.5-2.5 (3) MG/3ML SOLN Take 3 mLs by nebulization every 4 (four) hours as needed. For shortness of breath    Historical Provider, MD  montelukast (SINGULAIR) 10 MG tablet Take 10 mg by mouth at bedtime.      Historical Provider, MD  pravastatin (PRAVACHOL) 20 MG tablet Take 1 tablet (20 mg total) by mouth every evening. 02/18/16   Antoine PocheJonathan F Branch, MD    Family History History reviewed. No pertinent family history.  Social History Social History  Substance Use Topics  . Smoking status: Former Smoker    Packs/day: 0.00    Years: 10.00    Types: Cigarettes    Quit date: 10/20/2012  . Smokeless tobacco: Never Used  . Alcohol use No     Allergies   Patient has no known allergies.   Review of Systems Review of Systems ROS: Statement: All systems negative except as marked or noted in the HPI; Constitutional: Negative for fever and chills. ; ; Eyes: Negative for eye pain, redness and discharge. ; ; ENMT: Negative for ear pain, hoarseness, nasal congestion, sinus pressure and sore throat. ; ; Cardiovascular: Negative for chest pain, palpitations, diaphoresis, and peripheral edema. ; ; Respiratory: +SOB, cough. Negative for wheezing and stridor. ; ; Gastrointestinal: Negative for  nausea, vomiting, diarrhea, abdominal pain, blood in stool, hematemesis, jaundice and rectal bleeding. . ; ; Genitourinary: Negative for dysuria, flank pain and hematuria. ; ; Musculoskeletal: Negative for back pain and neck pain. Negative for swelling and trauma.; ; Skin: Negative for pruritus, rash, abrasions, blisters, bruising and skin lesion.; ; Neuro: Negative for headache, lightheadedness and neck stiffness. Negative for weakness, altered level of consciousness, altered mental status, extremity weakness, paresthesias, involuntary movement, seizure and syncope.       Physical Exam Updated Vital Signs BP  134/68 (BP Location: Left Arm)   Pulse 112   Temp 99.1 F (37.3 C) (Oral)   Resp 18   Ht 6' (1.829 m)   Wt 186 lb (84.4 kg)   SpO2 99%   BMI 25.23 kg/m   Physical Exam 1915: Physical examination:  Nursing notes reviewed; Vital signs and O2 SAT reviewed;  Constitutional: Well developed, Well nourished, Well hydrated, In no acute distress; Head:  Normocephalic, atraumatic; Eyes: EOMI, PERRL, No scleral icterus; ENMT: Mouth and pharynx normal, Mucous membranes moist; Neck: Supple, Full range of motion, No lymphadenopathy; Cardiovascular: Tachycardic rate and rhythm, No gallop; Respiratory: Breath sounds diminished & equal bilaterally, faint wheezes. Moist cough during exam. Speaking short sentences, sitting upright. Tachypneic.; Chest: Nontender, Movement normal; Abdomen: Soft, Nontender, Nondistended, Normal bowel sounds; Genitourinary: No CVA tenderness; Extremities: Pulses normal, No tenderness, No edema, No calf edema or asymmetry.; Neuro: AA&Ox3, Major CN grossly intact.  Speech clear. No gross focal motor or sensory deficits in extremities.; Skin: Color normal, Warm, Dry.   ED Treatments / Results  Labs (all labs ordered are listed, but only abnormal results are displayed)   EKG  EKG Interpretation  Date/Time:  Friday September 17 2016 17:53:23 EST Ventricular Rate:  118 PR Interval:  192 QRS Duration: 116 QT Interval:  316 QTC Calculation: 442 R Axis:   80 Text Interpretation:  Sinus tachycardia Right bundle branch block When compared with ECG of 11/27/2015 No significant change was found Confirmed by [facility]  MD, Nicholos Johns [phone]) on 09/17/2016 7:19:17 PM       Radiology   Procedures Procedures (including critical care time)  Medications Ordered in ED Medications  methylPREDNISolone sodium succinate (SOLU-MEDROL) 125 mg/2 mL injection 125 mg (125 mg Intravenous Given 09/17/16 1945)  albuterol (PROVENTIL,VENTOLIN) solution continuous neb (10 mg/hr Nebulization Given  09/17/16 1936)  ipratropium (ATROVENT) nebulizer solution 1 mg (1 mg Nebulization Given 09/17/16 1935)     Initial Impression / Assessment and Plan / ED Course  I have reviewed the triage vital signs and the nursing notes.  Pertinent labs & imaging results that were available during my care of the patient were reviewed by me and considered in my medical decision making (see chart for details).  MDM Reviewed: previous chart, nursing note and vitals Reviewed previous: labs and ECG Interpretation: labs, ECG and x-ray    Results for orders placed or performed during the hospital encounter of 09/17/16  Basic metabolic panel  Result Value Ref Range   Sodium 131 (L) 135 - 145 mmol/L   Potassium 4.2 3.5 - 5.1 mmol/L   Chloride 94 (L) 101 - 111 mmol/L   CO2 27 22 - 32 mmol/L   Glucose, Bld 216 (H) 65 - 99 mg/dL   BUN 8 6 - 20 mg/dL   Creatinine, Ser 3.24 0.61 - 1.24 mg/dL   Calcium 9.0 8.9 - 40.1 mg/dL   GFR calc non Af Amer >60 >60 mL/min   GFR calc Af Amer >  60 >60 mL/min   Anion gap 10 5 - 15  Troponin I  Result Value Ref Range   Troponin I 0.06 (HH) <0.03 ng/mL  CBC with Differential  Result Value Ref Range   WBC 12.8 (H) 4.0 - 10.5 K/uL   RBC 4.46 4.22 - 5.81 MIL/uL   Hemoglobin 13.1 13.0 - 17.0 g/dL   HCT 32.439.6 40.139.0 - 02.752.0 %   MCV 88.8 78.0 - 100.0 fL   MCH 29.4 26.0 - 34.0 pg   MCHC 33.1 30.0 - 36.0 g/dL   RDW 25.312.4 66.411.5 - 40.315.5 %   Platelets 223 150 - 400 K/uL   Neutrophils Relative % 84 %   Neutro Abs 10.8 (H) 1.7 - 7.7 K/uL   Lymphocytes Relative 8 %   Lymphs Abs 1.0 0.7 - 4.0 K/uL   Monocytes Relative 8 %   Monocytes Absolute 1.0 0.1 - 1.0 K/uL   Eosinophils Relative 0 %   Eosinophils Absolute 0.0 0.0 - 0.7 K/uL   Basophils Relative 0 %   Basophils Absolute 0.0 0.0 - 0.1 K/uL  D-dimer, quantitative  Result Value Ref Range   D-Dimer, Quant 0.43 0.00 - 0.50 ug/mL-FEU   Dg Chest 2 View Result Date: 09/17/2016 CLINICAL DATA:  Shortness of breath for 3 days.  EXAM: CHEST  2 VIEW COMPARISON:  11/27/2015 FINDINGS: Cardiomediastinal silhouette is normal. Mediastinal contours appear intact. Tortuosity and calcific atherosclerotic disease of the aorta noted. The lungs are hyperinflated. Upper lobe predominant emphysema is noted. There are small bilateral pleural effusions. Bilateral lower lobe peribronchial and subpleural subtle airspace opacities are seen. Osseous structures are without acute abnormality. Soft tissues are grossly normal. IMPRESSION: Emphysematous changes of the lungs. Small bilateral pleural effusions. Bilateral lower lobe peribronchial and subpleural opacities, which may represent infectious or inflammatory consolidation, scarring or potentially pulmonary nodules. Electronically Signed   By: Ted Mcalpineobrinka  Dimitrova M.D.   On: 09/17/2016 18:29    Results for Jeff Ray, Jeff Ray (MRN [id number]) as of 09/17/2016 22:11  Ref. Range 08/03/2015 17:37 09/17/2016 19:38  Troponin I Latest Ref Range: <0.03 ng/mL 0.10 (H) 0.06 (HH)    2210:  IV solumedrol and hour long neb given.  Pt states he "feels better" after neb and steroid.  NAD, lungs clear/diminished bilat, no wheezing, resps easy, speaking full sentences, Sats 99% on his usual O2 3L N/C.  Pt ambulated around the ED with Sats remaining 96< % on his usual O2 N/C, resps easy, NAD.  Pt states he wants to go home now. D-dimer negative; doubt PE. Troponin chronically elevated, pt endorses left sided CP, shoulder and neck pain all have been constant for the past 1 month and pt had reassuring cardiac stress test this year; doubt ACS. Offered pt admission; prefers to go home. Pt states he "has enough" neb solution at home. Tx symptomatically at this time. Dx and testing d/w pt.  Questions answered.  Verb understanding, agreeable to d/c home with outpt f/u.    Final Clinical Impressions(s) / ED Diagnoses   Final diagnoses:  None    New Prescriptions New Prescriptions   No medications on file     Samuel JesterKathleen  Cyndi Montejano, DO 09/20/16 0016

## 2016-09-17 NOTE — ED Notes (Signed)
Pt has one patch on each bicep. Pt states they are nicotine patches.

## 2016-09-17 NOTE — ED Notes (Signed)
Trop 0.06, dr Clarene Dukemcmanus notified

## 2016-09-17 NOTE — Discharge Instructions (Signed)
Take the prescriptions as directed.  Use your albuterol inhaler (2 to 4 puffs) or your albuterol nebulizer (1 unit dose) every 4 hours for the next 7 days, then as needed for cough, wheezing, or shortness of breath.  Call your regular medical doctor Monday morning to schedule a follow up appointment within the next 3 days.  Return to the Emergency Department immediately sooner if worsening.  ° °

## 2016-09-17 NOTE — ED Triage Notes (Signed)
SOB for last 3 days and increasing today.  Currently on 2 l/m from home.  Pain to left shoulder and left back and left chest.  Pt says he has been having pain to left shoulder, left chest and left neck for one year.  However, pain has been increasing over the last month.  Took Motrin at 1430 with mild relief.

## 2016-09-17 NOTE — ED Notes (Signed)
Pt ambulated around the nurses station. O2 remained 96% or higher.

## 2016-09-29 ENCOUNTER — Ambulatory Visit: Payer: Medicare HMO | Admitting: Urology

## 2016-10-13 ENCOUNTER — Ambulatory Visit: Payer: Medicare HMO | Admitting: Urology

## 2016-11-03 ENCOUNTER — Ambulatory Visit (INDEPENDENT_AMBULATORY_CARE_PROVIDER_SITE_OTHER): Payer: Medicare HMO | Admitting: Urology

## 2016-11-03 DIAGNOSIS — N3281 Overactive bladder: Secondary | ICD-10-CM

## 2016-12-15 ENCOUNTER — Ambulatory Visit (INDEPENDENT_AMBULATORY_CARE_PROVIDER_SITE_OTHER): Payer: Medicare HMO | Admitting: Urology

## 2016-12-15 DIAGNOSIS — N5201 Erectile dysfunction due to arterial insufficiency: Secondary | ICD-10-CM

## 2016-12-15 DIAGNOSIS — N401 Enlarged prostate with lower urinary tract symptoms: Secondary | ICD-10-CM | POA: Diagnosis not present

## 2016-12-15 DIAGNOSIS — N3281 Overactive bladder: Secondary | ICD-10-CM | POA: Diagnosis not present

## 2017-03-23 ENCOUNTER — Ambulatory Visit: Payer: Medicare HMO | Admitting: Urology

## 2017-05-04 ENCOUNTER — Ambulatory Visit (INDEPENDENT_AMBULATORY_CARE_PROVIDER_SITE_OTHER): Payer: Medicare HMO | Admitting: Urology

## 2017-05-04 DIAGNOSIS — N3281 Overactive bladder: Secondary | ICD-10-CM | POA: Diagnosis not present

## 2017-05-04 DIAGNOSIS — N401 Enlarged prostate with lower urinary tract symptoms: Secondary | ICD-10-CM | POA: Diagnosis not present

## 2017-05-04 DIAGNOSIS — N5201 Erectile dysfunction due to arterial insufficiency: Secondary | ICD-10-CM | POA: Diagnosis not present

## 2017-05-11 ENCOUNTER — Ambulatory Visit: Payer: Medicare HMO | Admitting: Urology

## 2017-10-15 ENCOUNTER — Other Ambulatory Visit: Payer: Self-pay

## 2017-10-15 ENCOUNTER — Emergency Department (HOSPITAL_COMMUNITY): Payer: Medicare HMO

## 2017-10-15 ENCOUNTER — Observation Stay (HOSPITAL_COMMUNITY)
Admission: EM | Admit: 2017-10-15 | Discharge: 2017-10-17 | Disposition: A | Discharge status: Home / Self Care | Payer: Medicare HMO | Attending: Internal Medicine | Admitting: Internal Medicine

## 2017-10-15 ENCOUNTER — Encounter (HOSPITAL_COMMUNITY): Payer: Self-pay

## 2017-10-15 DIAGNOSIS — R05 Cough: Secondary | ICD-10-CM | POA: Diagnosis present

## 2017-10-15 DIAGNOSIS — J45909 Unspecified asthma, uncomplicated: Secondary | ICD-10-CM | POA: Diagnosis not present

## 2017-10-15 DIAGNOSIS — M791 Myalgia, unspecified site: Secondary | ICD-10-CM | POA: Insufficient documentation

## 2017-10-15 DIAGNOSIS — F419 Anxiety disorder, unspecified: Secondary | ICD-10-CM | POA: Insufficient documentation

## 2017-10-15 DIAGNOSIS — I10 Essential (primary) hypertension: Secondary | ICD-10-CM | POA: Diagnosis present

## 2017-10-15 DIAGNOSIS — F17210 Nicotine dependence, cigarettes, uncomplicated: Secondary | ICD-10-CM | POA: Insufficient documentation

## 2017-10-15 DIAGNOSIS — E119 Type 2 diabetes mellitus without complications: Secondary | ICD-10-CM

## 2017-10-15 DIAGNOSIS — J441 Chronic obstructive pulmonary disease with (acute) exacerbation: Principal | ICD-10-CM | POA: Diagnosis present

## 2017-10-15 DIAGNOSIS — Z79899 Other long term (current) drug therapy: Secondary | ICD-10-CM | POA: Diagnosis not present

## 2017-10-15 DIAGNOSIS — Z7984 Long term (current) use of oral hypoglycemic drugs: Secondary | ICD-10-CM | POA: Diagnosis not present

## 2017-10-15 DIAGNOSIS — R7989 Other specified abnormal findings of blood chemistry: Secondary | ICD-10-CM | POA: Diagnosis not present

## 2017-10-15 DIAGNOSIS — Z720 Tobacco use: Secondary | ICD-10-CM | POA: Diagnosis present

## 2017-10-15 HISTORY — DX: Dependence on supplemental oxygen: Z99.81

## 2017-10-15 LAB — CBC WITH DIFFERENTIAL/PLATELET
Basophils Absolute: 0 10*3/uL (ref 0.0–0.1)
Basophils Relative: 0 %
Eosinophils Absolute: 0.1 10*3/uL (ref 0.0–0.7)
Eosinophils Relative: 1 %
HCT: 37.1 % — ABNORMAL LOW (ref 39.0–52.0)
HEMOGLOBIN: 11.8 g/dL — AB (ref 13.0–17.0)
LYMPHS ABS: 1.1 10*3/uL (ref 0.7–4.0)
LYMPHS PCT: 13 %
MCH: 27.6 pg (ref 26.0–34.0)
MCHC: 31.8 g/dL (ref 30.0–36.0)
MCV: 86.7 fL (ref 78.0–100.0)
MONOS PCT: 10 %
Monocytes Absolute: 0.8 10*3/uL (ref 0.1–1.0)
NEUTROS PCT: 76 %
Neutro Abs: 6.2 10*3/uL (ref 1.7–7.7)
Platelets: 267 10*3/uL (ref 150–400)
RBC: 4.28 MIL/uL (ref 4.22–5.81)
RDW: 12.9 % (ref 11.5–15.5)
WBC: 8.2 10*3/uL (ref 4.0–10.5)

## 2017-10-15 LAB — BASIC METABOLIC PANEL
Anion gap: 12 (ref 5–15)
BUN: 11 mg/dL (ref 6–20)
CHLORIDE: 98 mmol/L — AB (ref 101–111)
CO2: 28 mmol/L (ref 22–32)
CREATININE: 1.02 mg/dL (ref 0.61–1.24)
Calcium: 9.7 mg/dL (ref 8.9–10.3)
GFR calc non Af Amer: >60 mL/min (ref >60)
Glucose, Bld: 297 mg/dL — ABNORMAL HIGH (ref 65–99)
POTASSIUM: 4.5 mmol/L (ref 3.5–5.1)
SODIUM: 138 mmol/L (ref 135–145)

## 2017-10-15 LAB — INFLUENZA PANEL BY PCR (TYPE A & B)
Influenza A By PCR: NEGATIVE
Influenza B By PCR: NEGATIVE

## 2017-10-15 LAB — TROPONIN I: Troponin I: 0.03 ng/mL (ref <0.03)

## 2017-10-15 LAB — GLUCOSE, CAPILLARY: Glucose-Capillary: 361 mg/dL — ABNORMAL HIGH (ref 65–99)

## 2017-10-15 MED ORDER — ORAL CARE MOUTH RINSE
15.0000 mL | Freq: Two times a day (BID) | OROMUCOSAL | Status: DC
  Administered 2017-10-15 – 2017-10-16 (×3): 15 mL via OROMUCOSAL

## 2017-10-15 MED ORDER — ROSUVASTATIN CALCIUM 10 MG PO TABS
10.0000 mg | ORAL_TABLET | Freq: Every day | ORAL | Status: DC
  Administered 2017-10-15 – 2017-10-16 (×2): 10 mg via ORAL
  Filled 2017-10-15 (×2): qty 1

## 2017-10-15 MED ORDER — ALBUTEROL (5 MG/ML) CONTINUOUS INHALATION SOLN
10.0000 mg/h | INHALATION_SOLUTION | Freq: Once | RESPIRATORY_TRACT | Status: AC
Start: 2017-10-15 — End: 2017-10-15
  Administered 2017-10-15: 10 mg/h via RESPIRATORY_TRACT
  Filled 2017-10-15: qty 20

## 2017-10-15 MED ORDER — PREDNISONE 20 MG PO TABS
40.0000 mg | ORAL_TABLET | Freq: Every day | ORAL | Status: DC
  Administered 2017-10-16 – 2017-10-17 (×2): 40 mg via ORAL
  Filled 2017-10-15 (×2): qty 2

## 2017-10-15 MED ORDER — IPRATROPIUM BROMIDE 0.02 % IN SOLN
1.0000 mg | Freq: Once | RESPIRATORY_TRACT | Status: AC
  Administered 2017-10-15: 1 mg via RESPIRATORY_TRACT
  Filled 2017-10-15: qty 5

## 2017-10-15 MED ORDER — PREDNISONE 50 MG PO TABS
60.0000 mg | ORAL_TABLET | Freq: Once | ORAL | Status: AC
  Administered 2017-10-15: 60 mg via ORAL
  Filled 2017-10-15: qty 1

## 2017-10-15 MED ORDER — DEXTROSE 5 % IV SOLN
500.0000 mg | INTRAVENOUS | Status: DC
  Administered 2017-10-15 – 2017-10-16 (×2): 500 mg via INTRAVENOUS
  Filled 2017-10-15 (×4): qty 500

## 2017-10-15 MED ORDER — IPRATROPIUM-ALBUTEROL 0.5-2.5 (3) MG/3ML IN SOLN
3.0000 mL | Freq: Four times a day (QID) | RESPIRATORY_TRACT | Status: DC
  Administered 2017-10-16 (×2): 3 mL via RESPIRATORY_TRACT
  Filled 2017-10-15 (×2): qty 3

## 2017-10-15 MED ORDER — INSULIN ASPART 100 UNIT/ML IV SOLN
7.0000 [IU] | Freq: Once | INTRAVENOUS | Status: AC
  Administered 2017-10-15: 7 [IU] via INTRAVENOUS

## 2017-10-15 MED ORDER — PANTOPRAZOLE SODIUM 40 MG PO TBEC
40.0000 mg | DELAYED_RELEASE_TABLET | Freq: Every day | ORAL | Status: DC
  Administered 2017-10-16 – 2017-10-17 (×2): 40 mg via ORAL
  Filled 2017-10-15 (×2): qty 1

## 2017-10-15 MED ORDER — ENOXAPARIN SODIUM 40 MG/0.4ML ~~LOC~~ SOLN
40.0000 mg | SUBCUTANEOUS | Status: DC
  Administered 2017-10-16 – 2017-10-17 (×2): 40 mg via SUBCUTANEOUS
  Filled 2017-10-15 (×2): qty 0.4

## 2017-10-15 MED ORDER — MONTELUKAST SODIUM 10 MG PO TABS
10.0000 mg | ORAL_TABLET | Freq: Every morning | ORAL | Status: DC
  Administered 2017-10-16 – 2017-10-17 (×2): 10 mg via ORAL
  Filled 2017-10-15 (×2): qty 1

## 2017-10-15 MED ORDER — ALBUTEROL SULFATE HFA 108 (90 BASE) MCG/ACT IN AERS: 2.0000 | INHALATION_SPRAY | RESPIRATORY_TRACT | Status: DC | PRN

## 2017-10-15 MED ORDER — FLUTICASONE PROPIONATE 50 MCG/ACT NA SUSP
2.0000 | Freq: Every day | NASAL | Status: DC
  Administered 2017-10-16 – 2017-10-17 (×2): 2 via NASAL
  Filled 2017-10-15: qty 16

## 2017-10-15 MED ORDER — NICOTINE 14 MG/24HR TD PT24
14.0000 mg | MEDICATED_PATCH | Freq: Every day | TRANSDERMAL | Status: DC
  Administered 2017-10-15 – 2017-10-17 (×3): 14 mg via TRANSDERMAL
  Filled 2017-10-15 (×3): qty 1

## 2017-10-15 MED ORDER — MIRABEGRON ER 25 MG PO TB24
25.0000 mg | ORAL_TABLET | Freq: Every day | ORAL | Status: DC
  Administered 2017-10-16 – 2017-10-17 (×2): 25 mg via ORAL
  Filled 2017-10-15 (×2): qty 1

## 2017-10-15 MED ORDER — INSULIN ASPART 100 UNIT/ML ~~LOC~~ SOLN
0.0000 [IU] | Freq: Three times a day (TID) | SUBCUTANEOUS | Status: DC
  Administered 2017-10-16 (×2): 5 [IU] via SUBCUTANEOUS

## 2017-10-15 MED ORDER — GUAIFENESIN-DM 100-10 MG/5ML PO SYRP
5.0000 mL | ORAL_SOLUTION | Freq: Three times a day (TID) | ORAL | Status: DC
  Administered 2017-10-15 – 2017-10-17 (×5): 5 mL via ORAL
  Filled 2017-10-15 (×4): qty 5

## 2017-10-15 MED ORDER — ALPRAZOLAM 0.5 MG PO TABS
0.5000 mg | ORAL_TABLET | Freq: Every day | ORAL | Status: DC | PRN
  Administered 2017-10-15: 0.5 mg via ORAL
  Filled 2017-10-15: qty 1

## 2017-10-15 NOTE — H&P (Signed)
History and Physical    Jeff Ray WGN:[id number] DOB: [birth year] DOA: 10/15/2017  PCP: Gareth Morgan, MD   Patient coming from: Home  Chief Complaint: SOB, Cough  HPI: Jeff Ray is a 62 y.o. male with medical history significant for COPD, HTN, DM, Tobacco, who presented to the ED with complaints of cough-and shortness of breath of 3 days duration.  Cough is productive of greenish/yellowish sputum.  Patient reports bilateral lower rib pain and back pain.  He denies chest pain, no fever or chills.  Patient is on 2 L O2 at home.  Patient's quit smoking 2 years ago, previously smoked ~ 1PPD.  Denies illicit drug use or alcohol abuse.  No recent travel no sick contacts  ED Course: O2 sat 99% on nasal cannula, mild tachycardia 93- 111.  WBC normal at 8.2, troponin mildly elevated 0.03 chronically elevated , EKG showed right bundle branch block , LAFB, unchanged from prior .  Chest x-ray negative for acute abnormality. Patient was given prednisone in the ED although nebulizer, after ambulation patient's O2 sats dropped to 91%, shortness of breath, with increased O2 demands to 4 L.   Review of Systems: As per HPI otherwise 10 point review of systems negative.   Past Medical History:  Diagnosis Date  . Anxiety   . Asthma   . COPD (chronic obstructive pulmonary disease) (HCC)   . Diabetes mellitus   . History of nuclear stress test 02/2016   low risk study  . Hypertension   . On home O2     Past Surgical History:  Procedure Laterality Date  . BACK SURGERY       reports that he quit smoking about 4 years ago. His smoking use included cigarettes. He smoked 0.00 packs per day for 10.00 years. he has never used smokeless tobacco. He reports that he does not drink alcohol or use drugs.  No Known Allergies  Family History  Problem Relation Age of Onset  . Diabetes Mother     Prior to Admission medications   Medication Sig Start Date End Date Taking? Authorizing Provider    albuterol (PROAIR HFA) 108 (90 BASE) MCG/ACT inhaler Inhale 2 puffs into the lungs every 6 (six) hours as needed. Shortness of Breath   Yes [provider]  ALPRAZolam (XANAX) 1 MG tablet Take 1 mg by mouth 3 (three) times daily as needed for anxiety. 09/30/17  Yes [provider]  Ernestina Patches 62.5-25 MCG/INH AEPB Take 1 puff by mouth daily. 08/31/17  Yes [provider]  fexofenadine-pseudoephedrine (ALLEGRA-D 24) 180-240 MG 24 hr tablet Take 1 tablet by mouth daily.   Yes [provider]  fluticasone (FLONASE) 50 MCG/ACT nasal spray Place 2 sprays into both nostrils daily. 09/30/17  Yes [provider]  Guaifenesin (MUCINEX MAXIMUM STRENGTH) 1200 MG TB12 Take 1 tablet by mouth 2 (two) times daily.   Yes [provider]  ibuprofen (ADVIL,MOTRIN) 800 MG tablet Take 800 mg by mouth daily as needed. For pain   Yes [provider]  ipratropium-albuterol (DUONEB) 0.5-2.5 (3) MG/3ML SOLN Take 3 mLs by nebulization every 4 (four) hours as needed. For shortness of breath   Yes [provider]  mirabegron ER (MYRBETRIQ) 25 MG TB24 tablet Take 25 mg by mouth daily.   Yes [provider]  montelukast (SINGULAIR) 10 MG tablet Take 10 mg by mouth every morning.    Yes [provider]  neomycin-polymyxin-hydrocortisone (CORTISPORIN) OTIC solution Place 1 drop into both  ears 4 (four) times daily as needed for other. Wax buildup or irritation 09/30/17  Yes [provider]  pantoprazole (PROTONIX) 40 MG tablet Take 40 mg by mouth daily.   Yes [provider]  rosuvastatin (CRESTOR) 10 MG tablet Take 10 mg by mouth at bedtime. 07/24/17  Yes [provider]  VESICARE 10 MG tablet Take 10 mg by mouth at bedtime. 09/30/17  Yes [provider]    Physical Exam: Vitals:   10/15/17 1630 10/15/17 1700 10/15/17 1800 10/15/17 1830  BP: 125/70 (!) 144/73 138/78 (!) 141/73  Pulse: 93 95 (!) 111  (!) 109  Resp: 17 15 16  (!) 23  Temp:      TempSrc:      SpO2: 99% 100% 99% 100%  Weight:      Height:        Constitutional: NAD, calm, comfortable Vitals:   10/15/17 1630 10/15/17 1700 10/15/17 1800 10/15/17 1830  BP: 125/70 (!) 144/73 138/78 (!) 141/73  Pulse: 93 95 (!) 111 (!) 109  Resp: 17 15 16  (!) 23  Temp:      TempSrc:      SpO2: 99% 100% 99% 100%  Weight:      Height:       Eyes: PERRL, lids and conjunctivae normal ENMT: Mucous membranes are moist. Posterior pharynx clear of any exudate or lesions. Neck: normal, supple,  Respiratory: Lung sounds tight with globally reduced air entry, Normal respiratory effort. No accessory muscle use.  Cardiovascular: Difficult to appreciate due to habitus. No extremity edema. 2+ pedal pulses.  Abdomen: no tenderness, no masses palpated. No hepatosplenomegaly. Bowel sounds positive.  Musculoskeletal: no clubbing / cyanosis. No joint deformity upper and lower extremities. Good ROM, no contractures. Normal muscle tone.  Skin: no rashes, lesions, ulcers. No induration Neurologic: CN 2-12 grossly intact. Strength 5/5 in all 4.  Psychiatric: Normal judgment and insight. Alert and oriented x 3. Normal mood.   Labs on Admission: I have personally reviewed following labs and imaging studies  CBC: Recent Labs  Lab 10/15/17 1625  WBC 8.2  NEUTROABS 6.2  HGB 11.8*  HCT 37.1*  MCV 86.7  PLT 267   Basic Metabolic Panel: Recent Labs  Lab 10/15/17 1625  NA 138  K 4.5  CL 98*  CO2 28  GLUCOSE 297*  BUN 11  CREATININE 1.02  CALCIUM 9.7   Cardiac Enzymes: Recent Labs  Lab 10/15/17 1625  TROPONINI 0.03*   Urine analysis:    Component Value Date/Time   COLORURINE YELLOW 02/08/2008 1731   APPEARANCEUR CLEAR 02/08/2008 1731   LABSPEC 1.015 02/08/2008 1731   PHURINE 5.5 02/08/2008 1731   GLUCOSEU NEGATIVE 02/08/2008 1731   HGBUR NEGATIVE 02/08/2008 1731   BILIRUBINUR NEGATIVE 02/08/2008 1731   KETONESUR NEGATIVE 02/08/2008  1731   PROTEINUR NEGATIVE 02/08/2008 1731   UROBILINOGEN 0.2 02/08/2008 1731   NITRITE NEGATIVE 02/08/2008 1731   LEUKOCYTESUR  02/08/2008 1731    NEGATIVE MICROSCOPIC NOT DONE ON URINES WITH NEGATIVE PROTEIN, BLOOD, LEUKOCYTES, NITRITE, OR GLUCOSE <1000 mg/dL.    Radiological Exams on Admission: Dg Chest 2 View  Result Date: 10/15/2017 CLINICAL DATA:  Shortness of breath, chest tightness EXAM: CHEST  2 VIEW COMPARISON:  09/17/2016 FINDINGS: There is hyperinflation of the lungs compatible with COPD. Heart is normal size. Chronic scarring in both lung bases. No effusions. No acute bony abnormality. IMPRESSION: COPD/chronic changes.  No active disease. Electronically Signed   By: Charlett Nose M.D.   On: 10/15/2017  16:03    EKG: Independently reviewed. QTc- 430, RBBB, LAFB. EKG-no significant change from prior.  Assessment/Plan Principal Problem:   COPD exacerbation (HCC) Active Problems:   HTN (hypertension)   DM (diabetes mellitus) (HCC)   Tobacco abuse  COPD exacerbation- SOB, productive cough.  WBC 8.2.  Chest x-ray negative for acute abnormality. Trop X1- neg. on 2 L home O2. -Scheduled duo nebs - PRN an albuterol -Continue prednisone at 40 mg daily a.m. -IV azithromycin -Mucolytics, suppl O2 - Hold home ellipta, to avoid duplicating meds -Respiratory virus panel  Elevated Troponin- Cr -0.03. Chronically elevated in the past.  No chest pain. EKG no significant change from prior. - trend trop - Cardiac monitoring  DM-last hemoglobin A1c on file 11/2015.  CBG 297. Not on Hypoglycemic meds. - SSI,  on steriods  HTN-stable. -Not on medication  Anxiety-Home medications 1 mg Xanax, patient reports he takes it once or twice a week for anxiety.  He feels anxious now -Resume at reduced dose of 0.5 mg daily PRN considering respiratory status  DVT prophylaxis:Lovenox Code Status: Full  Family Communication: None at bedside Disposition Plan: 1-2 days Consults called:  None Admission status: Obs, med surg   Onnie BoerEjiroghene E Giovonnie Trettel MD [facility] Pager [phone]  If 11PM-7AM, please contact night-coverage [email or web address] Password [email or web address]  10/15/2017, 6:51 PM

## 2017-10-15 NOTE — ED Notes (Signed)
Pt walked around nurse's station. O2 sats flucutated between 91-100%. Pt's work of breathing significantly increaed. HR went up to 131. While walking, pt was on 4L O2 [place]

## 2017-10-15 NOTE — ED Notes (Signed)
Date and time results received: 10/15/17 1728 (use smartphrase ".now" to insert current time)  Test: TROP  Critical Value: 0.03  [facility]  Orders Received? Or Actions Taken?: NONE

## 2017-10-15 NOTE — ED Triage Notes (Signed)
Patient reports of generalized body aches and productive cough x3 days ago. Denies fevers.  Currently on 2 LPM of home oxygen.

## 2017-10-15 NOTE — ED Provider Notes (Signed)
[facility] EMERGENCY DEPARTMENT Provider Note   CSN: [id number] Arrival date & time: 10/15/17  1503     History   Chief Complaint Chief Complaint  Patient presents with  . Generalized Body Aches  . Cough    HPI Jeff Ray is a 62 y.o. male.  HPI  Pt was seen at 1550.  Per pt, c/o gradual onset and worsening of persistent cough and SOB for the past 3 days. Has been associated with bilat lower ribs "pains," left ear pain, and sinus congestion. Has been using home O2, MDI and nebs with transient relief.  Denies palpitations, no back pain, no abd pain, no N/V/D, no fevers, no rash, no sore throat.     Past Medical History:  Diagnosis Date  . Anxiety   . Asthma   . COPD (chronic obstructive pulmonary disease) (HCC)   . Diabetes mellitus   . History of nuclear stress test 02/2016   low risk study  . Hypertension     Patient Active Problem List   Diagnosis Date Noted  . Anemia 08/03/2015  . CAP (community acquired pneumonia) 06/02/2013  . Respiratory failure, acute and chronic (HCC) 05/26/2013  . COPD exacerbation (HCC) 05/26/2013  . HTN (hypertension) 05/26/2013  . DM (diabetes mellitus) (HCC) 05/26/2013  . Tobacco abuse 05/26/2013    Past Surgical History:  Procedure Laterality Date  . BACK SURGERY         Home Medications    Prior to Admission medications   Medication Sig Start Date End Date Taking? Authorizing Provider  albuterol (PROAIR HFA) 108 (90 BASE) MCG/ACT inhaler Inhale 2 puffs into the lungs every 6 (six) hours as needed. Shortness of Breath   Yes [provider]  fexofenadine-pseudoephedrine (ALLEGRA-D 24) 180-240 MG 24 hr tablet Take 1 tablet by mouth daily.   Yes [provider]  Guaifenesin (MUCINEX MAXIMUM STRENGTH) 1200 MG TB12 Take 1 tablet by mouth 2 (two) times daily.   Yes [provider]  ibuprofen (ADVIL,MOTRIN) 800 MG tablet Take 800 mg by mouth daily as needed. For pain   Yes [provider]    ipratropium-albuterol (DUONEB) 0.5-2.5 (3) MG/3ML SOLN Take 3 mLs by nebulization every 4 (four) hours as needed. For shortness of breath   Yes [provider]  mirabegron ER (MYRBETRIQ) 25 MG TB24 tablet Take 25 mg by mouth daily.   Yes [provider]  montelukast (SINGULAIR) 10 MG tablet Take 10 mg by mouth every morning.    Yes [provider]  pantoprazole (PROTONIX) 40 MG tablet Take 40 mg by mouth daily.   Yes [provider]  ALPRAZolam Prudy Feeler) 1 MG tablet Take 1 mg by mouth 3 (three) times daily as needed for anxiety. 09/30/17   [provider]  Ernestina Patches 62.5-25 MCG/INH AEPB Take 1 puff by mouth daily. 08/31/17   [provider]  doxycycline (VIBRA-TABS) 100 MG tablet Take 1 tablet (100 mg total) by mouth 2 (two) times daily. Patient not taking: Reported on 10/15/2017 09/17/16   Samuel Jester, DO  fluticasone [facility]) 50 MCG/ACT nasal spray Place 2 sprays into both nostrils daily. 09/30/17   [provider]  neomycin-polymyxin-hydrocortisone (CORTISPORIN) OTIC solution Place 1 drop into both ears 4 (four) times daily as needed for other. Wax buildup or irritation 09/30/17   [provider]  predniSONE (DELTASONE) 20 MG tablet Take 2 tablets (40 mg total) by mouth daily. Patient not taking: Reported on 10/15/2017 09/17/16   Samuel Jester, DO  rosuvastatin (CRESTOR) 10 MG tablet Take 10 mg by mouth at bedtime. 07/24/17   [provider]  VESICARE 10 MG tablet Take 10 mg by mouth at bedtime. 09/30/17   [provider]    Family History No family history on file.  Social History Social History   Tobacco Use  . Smoking status: Former Smoker    Packs/day: 0.00    Years: 10.00    Pack years: 0.00    Types: Cigarettes    Last attempt to quit: 10/20/2012    Years since quitting: 4.9  . Smokeless tobacco: Never Used  Substance Use Topics  . Alcohol use: No    Alcohol/week: 0.0 oz  .  Drug use: No     Allergies   Patient has no known allergies.   Review of Systems Review of Systems ROS: Statement: All systems negative except as marked or noted in the HPI; Constitutional: Negative for fever and chills. ; ; Eyes: Negative for eye pain, redness and discharge. ; ; ENMT: +left ear pain, sinus congestion. Negative for hoarseness and sore throat. ; ; Cardiovascular: Negative for palpitations, diaphoresis, and peripheral edema. ; ; Respiratory: +cough, SOB. Negative for wheezing and stridor. ; ; Gastrointestinal: Negative for nausea, vomiting, diarrhea, abdominal pain, blood in stool, hematemesis, jaundice and rectal bleeding. . ; ; Genitourinary: Negative for dysuria, flank pain and hematuria. ; ; Musculoskeletal: +bilat lower ribs pain. Negative for back pain and neck pain. Negative for swelling and trauma.; ; Skin: Negative for pruritus, rash, abrasions, blisters, bruising and skin lesion.; ; Neuro: Negative for headache, lightheadedness and neck stiffness. Negative for weakness, altered level of consciousness, altered mental status, extremity weakness, paresthesias, involuntary movement, seizure and syncope.       Physical Exam Updated Vital Signs BP 131/70 (BP Location: Left Arm)   Pulse (!) 110   Temp 97.9 F (36.6 C) (Oral)   Resp 20   Ht 6\' 1"  (1.854 m)   Wt 81.2 kg (179 lb)   SpO2 99%   BMI 23.62 kg/m    BP (!) 144/73   Pulse 95   Temp 97.9 F (36.6 C) (Oral)   Resp 15   Ht 6\' 1"  (1.854 m)   Wt 81.2 kg (179 lb)   SpO2 100%   BMI 23.62 kg/m    Physical Exam 1555: Physical examination:  Nursing notes reviewed; Vital signs and O2 SAT reviewed;  Constitutional: Well developed, Well nourished, Well hydrated, Uncomfortable appearing.; Head:  Normocephalic, atraumatic; Eyes: EOMI, PERRL, No scleral icterus; ENMT: TM's clear bilat. +edemetous nasal turbinates bilat with clear rhinorrhea. Mouth and pharynx without lesions. No tonsillar exudates. No intra-oral  edema. No submandibular or sublingual edema. No hoarse voice, no drooling, no stridor. No pain with manipulation of larynx. No trismus. Mouth and pharynx normal, Mucous membranes moist; Neck: Supple, Full range of motion, No lymphadenopathy; Cardiovascular: Tachycardic rate and rhythm, No gallop; Respiratory: Breath sounds diminished & equal bilaterally, faint wheezes. No audible wheezing. +moist cough during exam. Sitting upright. Mild tachypnea. Speaking full sentences, Normal respiratory effort/excursion; Chest: Nontender, Movement normal; Abdomen: Soft, Nontender, Nondistended, Normal bowel sounds; Genitourinary: No CVA tenderness; Extremities: Pulses normal, No tenderness, No edema, No calf edema or asymmetry.; Neuro: AA&Ox3, Major CN grossly intact.  Speech clear. No gross focal motor or sensory deficits in extremities.; Skin: Color normal, Warm, Dry.   ED Treatments / Results  Labs (all labs ordered are listed, but only abnormal results are displayed)   EKG  EKG Interpretation  Date/Time:  Saturday October 15 2017 16:32:35 EST Ventricular Rate:  93 PR Interval:    QRS Duration: 137 QT Interval:  341 QTC Calculation: 425 R Axis:   -44 Text Interpretation:  Sinus rhythm Prolonged PR interval RBBB and LAFB When compared with ECG of 09/17/2016 No significant change was found Confirmed by Samuel Jester [phone]) on 10/15/2017 4:35:24 PM       Radiology   Procedures Procedures (including critical care time)  Medications Ordered in ED Medications  predniSONE (DELTASONE) tablet 60 mg (60 mg Oral Given 10/15/17 1619)  albuterol (PROVENTIL,VENTOLIN) solution continuous neb (10 mg/hr Nebulization Given 10/15/17 1627)  ipratropium (ATROVENT) nebulizer solution 1 mg (1 mg Nebulization Given 10/15/17 1627)     Initial Impression / Assessment and Plan / ED Course  I have reviewed the triage vital signs and the nursing notes.  Pertinent labs & imaging results that were available during  my care of the patient were reviewed by me and considered in my medical decision making (see chart for details).  MDM Reviewed: previous chart, nursing note and vitals Reviewed previous: labs and ECG Interpretation: labs, ECG and x-ray Total time providing critical care: 30-74 minutes. This excludes time spent performing separately reportable procedures and services. Consults: admitting MD   CRITICAL CARE Performed by: Laray Anger Total critical care time: 35 minutes Critical care time was exclusive of separately billable procedures and treating other patients. Critical care was necessary to treat or prevent imminent or life-threatening deterioration. Critical care was time spent personally by me on the following activities: development of treatment plan with patient and/or surrogate as well as nursing, discussions with consultants, evaluation of patient's response to treatment, examination of patient, obtaining history from patient or surrogate, ordering and performing treatments and interventions, ordering and review of laboratory studies, ordering and review of radiographic studies, pulse oximetry and re-evaluation of patient's condition.    Results for orders placed or performed during the hospital encounter of 10/15/17  Basic metabolic panel  Result Value Ref Range   Sodium 138 135 - 145 mmol/L   Potassium 4.5 3.5 - 5.1 mmol/L   Chloride 98 (L) 101 - 111 mmol/L   CO2 28 22 - 32 mmol/L   Glucose, Bld 297 (H) 65 - 99 mg/dL   BUN 11 6 - 20 mg/dL   Creatinine, Ser 1.91 0.61 - 1.24 mg/dL   Calcium 9.7 8.9 - 47.8 mg/dL   GFR calc non Af Amer >60 >60 mL/min   GFR calc Af Amer >60 >60 mL/min   Anion gap 12 5 - 15  CBC with Differential  Result Value Ref Range   WBC 8.2 4.0 - 10.5 K/uL   RBC 4.28 4.22 - 5.81 MIL/uL   Hemoglobin 11.8 (L) 13.0 - 17.0 g/dL   HCT 29.5 (L) 62.1 - 30.8 %   MCV 86.7 78.0 - 100.0 fL   MCH 27.6 26.0 - 34.0 pg   MCHC 31.8 30.0 - 36.0 g/dL   RDW  65.7 84.6 - 96.2 %   Platelets 267 150 - 400 K/uL   Neutrophils Relative % 76 %   Neutro Abs 6.2 1.7 - 7.7 K/uL   Lymphocytes Relative 13 %   Lymphs Abs 1.1 0.7 - 4.0 K/uL   Monocytes Relative 10 %   Monocytes Absolute 0.8 0.1 - 1.0 K/uL   Eosinophils Relative 1 %   Eosinophils Absolute 0.1 0.0 - 0.7 K/uL   Basophils Relative 0 %   Basophils Absolute 0.0 0.0 - 0.1  K/uL  Troponin I  Result Value Ref Range   Troponin I 0.03 (HH) <0.03 ng/mL  Influenza panel by PCR (type A & B)  Result Value Ref Range   Influenza A By PCR NEGATIVE NEGATIVE   Influenza B By PCR NEGATIVE NEGATIVE   Dg Chest 2 View Result Date: 10/15/2017 CLINICAL DATA:  Shortness of breath, chest tightness EXAM: CHEST  2 VIEW COMPARISON:  09/17/2016 FINDINGS: There is hyperinflation of the lungs compatible with COPD. Heart is normal size. Chronic scarring in both lung bases. No effusions. No acute bony abnormality. IMPRESSION: COPD/chronic changes.  No active disease. Electronically Signed   By: Charlett NoseKevin  Dover M.D.   On: 10/15/2017 16:03    1735:  On arrival: pt sitting upright, tachycardic, Sats 99 % on his usual O2 N/C, lungs diminished, +moist cough. Steroid and hour long neb started. During neb: pt appears more comfortable at rest, less tachypneic, Sats 99 % on O2 2L N/C, lungs continue diminished with faint scattered wheezing. No audible wheezing.  H/H per baseline, troponin chronically elevated.  1840:  Pt ambulated in hallway: pt's O2 Sats dropped to 91% on O2 4L N/C with pt c/o increasing SOB, with increasing RR and HR increasing to 130's. Pt escorted back to stretcher with O2 Sats slowly increasing to 99 % on O2 2L N/C. Pt's lungs diminished, faint scattered wheezing, no audible wheezing. Dx and testing d/w pt.  Questions answered.  Verb understanding, agreeable to admit.  T/C to [facility] Dr. Mariea ClontsEmokpae, case discussed, including:  HPI, pertinent PM/SHx, VS/PE, dx testing, ED course and treatment:  Agreeable to admit.      Final Clinical Impressions(s) / ED Diagnoses   Final diagnoses:  None    ED Discharge Orders    None       Samuel JesterMcManus, Doristine Shehan, DO 10/16/17 2253

## 2017-10-15 NOTE — ED Notes (Signed)
Pt in xray. 2x's attempts for IV. Will get another nurse to attempt.

## 2017-10-15 NOTE — ED Notes (Signed)
Gave EKG to Dr. McManus  

## 2017-10-16 DIAGNOSIS — I10 Essential (primary) hypertension: Secondary | ICD-10-CM

## 2017-10-16 DIAGNOSIS — J441 Chronic obstructive pulmonary disease with (acute) exacerbation: Secondary | ICD-10-CM | POA: Diagnosis not present

## 2017-10-16 LAB — RESPIRATORY PANEL BY PCR
Adenovirus: NOT DETECTED
Bordetella pertussis: NOT DETECTED
CHLAMYDOPHILA PNEUMONIAE-RVPPCR: NOT DETECTED
CORONAVIRUS OC43-RVPPCR: NOT DETECTED
Coronavirus 229E: NOT DETECTED
Coronavirus HKU1: NOT DETECTED
Coronavirus NL63: NOT DETECTED
INFLUENZA A-RVPPCR: NOT DETECTED
INFLUENZA B-RVPPCR: NOT DETECTED
MYCOPLASMA PNEUMONIAE-RVPPCR: NOT DETECTED
Metapneumovirus: NOT DETECTED
PARAINFLUENZA VIRUS 3-RVPPCR: NOT DETECTED
PARAINFLUENZA VIRUS 4-RVPPCR: NOT DETECTED
Parainfluenza Virus 1: NOT DETECTED
Parainfluenza Virus 2: NOT DETECTED
RESPIRATORY SYNCYTIAL VIRUS-RVPPCR: NOT DETECTED
RHINOVIRUS / ENTEROVIRUS - RVPPCR: NOT DETECTED

## 2017-10-16 LAB — TROPONIN I
TROPONIN I: 0.04 ng/mL — AB (ref <0.03)
Troponin I: 0.04 ng/mL (ref <0.03)

## 2017-10-16 LAB — GLUCOSE, CAPILLARY
GLUCOSE-CAPILLARY: 258 mg/dL — AB (ref 65–99)
GLUCOSE-CAPILLARY: 275 mg/dL — AB (ref 65–99)
Glucose-Capillary: 298 mg/dL — ABNORMAL HIGH (ref 65–99)
Glucose-Capillary: 268 mg/dL — ABNORMAL HIGH (ref 65–99)

## 2017-10-16 MED ORDER — INSULIN ASPART 100 UNIT/ML ~~LOC~~ SOLN
0.0000 [IU] | Freq: Every day | SUBCUTANEOUS | Status: DC
  Administered 2017-10-16: 3 [IU] via SUBCUTANEOUS

## 2017-10-16 MED ORDER — INSULIN ASPART 100 UNIT/ML ~~LOC~~ SOLN
6.0000 [IU] | Freq: Three times a day (TID) | SUBCUTANEOUS | Status: DC
  Administered 2017-10-16 – 2017-10-17 (×3): 6 [IU] via SUBCUTANEOUS

## 2017-10-16 MED ORDER — INSULIN ASPART 100 UNIT/ML ~~LOC~~ SOLN
0.0000 [IU] | Freq: Three times a day (TID) | SUBCUTANEOUS | Status: DC
  Administered 2017-10-16 – 2017-10-17 (×2): 11 [IU] via SUBCUTANEOUS
  Administered 2017-10-17: 7 [IU] via SUBCUTANEOUS

## 2017-10-16 MED ORDER — IPRATROPIUM-ALBUTEROL 0.5-2.5 (3) MG/3ML IN SOLN
3.0000 mL | Freq: Three times a day (TID) | RESPIRATORY_TRACT | Status: DC
  Administered 2017-10-16 – 2017-10-17 (×3): 3 mL via RESPIRATORY_TRACT
  Filled 2017-10-16 (×4): qty 3

## 2017-10-16 NOTE — Progress Notes (Signed)
CRITICAL VALUE ALERT  Critical Value:  Troponin, 0.04  Date & Time Notied:  10/16/16, [id number]  Provider Notified: Blount  Orders Received/Actions taken: No new orders

## 2017-10-16 NOTE — Progress Notes (Signed)
PROGRESS NOTE    Jeff Ray  NGE:[id number] DOB: 08/07/1956 DOA: 10/15/2017 PCP: Gareth Morgan, MD     Brief Narrative:  63 year old man admitted from home on 1/12 due to shortness of breath.  Has a history of oxygen dependent COPD, hypertension, diabetes.  He became real short of breath on day of admission and admission was requested.   Assessment & Plan:   Principal Problem:   COPD exacerbation (HCC) Active Problems:   HTN (hypertension)   DM (diabetes mellitus) (HCC)   Tobacco abuse   Acute on chronic hypoxemic respiratory failure -On account of COPD with acute exacerbation -Improved clinical condition today, with less lung wheezing. -Continue nebs, steroids, empiric antibiotic therapy with azithromycin. -No evidence of pneumonia on chest x-ray.  Diabetes -CBGs elevated, likely on account of concomitant steroid use. -Change sliding scale to resistant, add meal and nighttime coverage.  Hypertension -Well-controlled, continue current management.  Elevated troponin -No chest pain, flat in the range of 0.04. -Elevated troponin likely due to acute COPD exacerbation.   DVT prophylaxis: Lovenox Code Status: Full code Family Communication: Patient only Disposition Plan: Anticipate discharge home in 24 hours  Consultants:   None  Procedures:   None  Antimicrobials:  Anti-infectives (From admission, onward)   Start     Dose/Rate Route Frequency Ordered Stop   10/15/17 2215  azithromycin (ZITHROMAX) 500 mg in dextrose 5 % 250 mL IVPB     500 mg 250 mL/hr over 60 Minutes Intravenous Every 24 hours 10/15/17 2207         Subjective: Feels improved, shortness of breath is almost back to baseline.  States that he has been very stressed at home with familial issues.  Objective: Vitals:   10/15/17 2111 10/16/17 0005 10/16/17 0610 10/16/17 0759  BP: (!) 149/72  136/75   Pulse: (!) 105  88   Resp: 20  20   Temp: 98 F (36.7 C)  97.7 F (36.5 C)   TempSrc:  Oral  Oral   SpO2: 94% 95% 100% 98%  Weight: 80.4 kg (177 lb 4.8 oz)     Height: 6\' 1"  (1.854 m)       Intake/Output Summary (Last 24 hours) at 10/16/2017 1308 Last data filed at 10/16/2017 0300 Gross per 24 hour  Intake 250 ml  Output -  Net 250 ml   Filed Weights   10/15/17 1507 10/15/17 2111  Weight: 81.2 kg (179 lb) 80.4 kg (177 lb 4.8 oz)    Examination:  General exam: Alert, awake, oriented x 3 Respiratory system: Mild expiratory wheezing Cardiovascular system:RRR. No murmurs, rubs, gallops. Gastrointestinal system: Abdomen is nondistended, soft and nontender. No organomegaly or masses felt. Normal bowel sounds heard. Central nervous system: Alert and oriented. No focal neurological deficits. Extremities: No C/C/E, +pedal pulses Skin: No rashes, lesions or ulcers Psychiatry: Judgement and insight appear normal. Mood & affect appropriate.     Data Reviewed: I have personally reviewed following labs and imaging studies  CBC: Recent Labs  Lab 10/15/17 1625  WBC 8.2  NEUTROABS 6.2  HGB 11.8*  HCT 37.1*  MCV 86.7  PLT 267   Basic Metabolic Panel: Recent Labs  Lab 10/15/17 1625  NA 138  K 4.5  CL 98*  CO2 28  GLUCOSE 297*  BUN 11  CREATININE 1.02  CALCIUM 9.7   GFR: Estimated Creatinine Clearance: 84.9 mL/min (by C-G formula based on SCr of 1.02 mg/dL). Liver Function Tests: No results for input(s): AST, ALT, ALKPHOS, BILITOT, PROT,  ALBUMIN in the last 168 hours. No results for input(s): LIPASE, AMYLASE in the last 168 hours. No results for input(s): AMMONIA in the last 168 hours. Coagulation Profile: No results for input(s): INR, PROTIME in the last 168 hours. Cardiac Enzymes: Recent Labs  Lab 10/15/17 1625 10/15/17 2303 10/16/17 0535  TROPONINI 0.03* 0.04* 0.04*   BNP (last 3 results) No results for input(s): PROBNP in the last 8760 hours. HbA1C: No results for input(s): HGBA1C in the last 72 hours. CBG: Recent Labs  Lab 10/15/17 2316  10/16/17 0726 10/16/17 1120  GLUCAP 361* 258* 298*   Lipid Profile: No results for input(s): CHOL, HDL, LDLCALC, TRIG, CHOLHDL, LDLDIRECT in the last 72 hours. Thyroid Function Tests: No results for input(s): TSH, T4TOTAL, FREET4, T3FREE, THYROIDAB in the last 72 hours. Anemia Panel: No results for input(s): VITAMINB12, FOLATE, FERRITIN, TIBC, IRON, RETICCTPCT in the last 72 hours. Urine analysis:    Component Value Date/Time   COLORURINE YELLOW 02/08/2008 1731   APPEARANCEUR CLEAR 02/08/2008 1731   LABSPEC 1.015 02/08/2008 1731   PHURINE 5.5 02/08/2008 1731   GLUCOSEU NEGATIVE 02/08/2008 1731   HGBUR NEGATIVE 02/08/2008 1731   BILIRUBINUR NEGATIVE 02/08/2008 1731   KETONESUR NEGATIVE 02/08/2008 1731   PROTEINUR NEGATIVE 02/08/2008 1731   UROBILINOGEN 0.2 02/08/2008 1731   NITRITE NEGATIVE 02/08/2008 1731   LEUKOCYTESUR  02/08/2008 1731    NEGATIVE MICROSCOPIC NOT DONE ON URINES WITH NEGATIVE PROTEIN, BLOOD, LEUKOCYTES, NITRITE, OR GLUCOSE <1000 mg/dL.   Sepsis Labs: @LABRCNTIP (procalcitonin:4,lacticidven:4)  )No results found for this or any previous visit (from the past 240 hour(s)).       Radiology Studies: Dg Chest 2 View  Result Date: 10/15/2017 CLINICAL DATA:  Shortness of breath, chest tightness EXAM: CHEST  2 VIEW COMPARISON:  09/17/2016 FINDINGS: There is hyperinflation of the lungs compatible with COPD. Heart is normal size. Chronic scarring in both lung bases. No effusions. No acute bony abnormality. IMPRESSION: COPD/chronic changes.  No active disease. Electronically Signed   By: Charlett NoseKevin  Dover M.D.   On: 10/15/2017 16:03        Scheduled Meds: . enoxaparin (LOVENOX) injection  40 mg Subcutaneous Q24H  . fluticasone  2 spray Each Nare Daily  . guaiFENesin-dextromethorphan  5 mL Oral Q8H  . insulin aspart  0-9 Units Subcutaneous TID WC  . ipratropium-albuterol  3 mL Nebulization TID  . mouth rinse  15 mL Mouth Rinse BID  . mirabegron ER  25 mg Oral Daily   . montelukast  10 mg Oral q morning - 10a  . nicotine  14 mg Transdermal Daily  . pantoprazole  40 mg Oral Daily  . predniSONE  40 mg Oral Q breakfast  . rosuvastatin  10 mg Oral QHS   Continuous Infusions: . azithromycin Stopped (10/16/17 0113)     LOS: 0 days    Time spent: 25 minutes. Greater than 50% of this time was spent in direct contact with the patient coordinating care.     Chaya JanEstela Hernandez Acosta, MD [facility] Pager [phone]  If 7PM-7AM, please contact night-coverage [email or web address] Password [email or web address] 10/16/2017, 1:08 PM

## 2017-10-17 DIAGNOSIS — J441 Chronic obstructive pulmonary disease with (acute) exacerbation: Secondary | ICD-10-CM | POA: Diagnosis not present

## 2017-10-17 LAB — GLUCOSE, CAPILLARY
GLUCOSE-CAPILLARY: 273 mg/dL — AB (ref 65–99)
Glucose-Capillary: 203 mg/dL — ABNORMAL HIGH (ref 65–99)

## 2017-10-17 LAB — HIV ANTIBODY (ROUTINE TESTING W REFLEX): HIV Screen 4th Generation wRfx: NONREACTIVE

## 2017-10-17 LAB — CBC
HEMATOCRIT: 36.3 % — AB (ref 39.0–52.0)
HEMOGLOBIN: 11.6 g/dL — AB (ref 13.0–17.0)
MCH: 27.5 pg (ref 26.0–34.0)
MCHC: 32 g/dL (ref 30.0–36.0)
MCV: 86 fL (ref 78.0–100.0)
Platelets: 322 10*3/uL (ref 150–400)
RBC: 4.22 MIL/uL (ref 4.22–5.81)
RDW: 12.8 % (ref 11.5–15.5)
WBC: 10.4 10*3/uL (ref 4.0–10.5)

## 2017-10-17 LAB — HEMOGLOBIN A1C
Hgb A1c MFr Bld: 12.7 % — ABNORMAL HIGH (ref 4.8–5.6)
MEAN PLASMA GLUCOSE: 317.79 mg/dL

## 2017-10-17 LAB — BASIC METABOLIC PANEL
ANION GAP: 9 (ref 5–15)
BUN: 13 mg/dL (ref 6–20)
CHLORIDE: 97 mmol/L — AB (ref 101–111)
CO2: 30 mmol/L (ref 22–32)
Calcium: 9.5 mg/dL (ref 8.9–10.3)
Creatinine, Ser: 0.89 mg/dL (ref 0.61–1.24)
Glucose, Bld: 219 mg/dL — ABNORMAL HIGH (ref 65–99)
POTASSIUM: 4.1 mmol/L (ref 3.5–5.1)
SODIUM: 136 mmol/L (ref 135–145)

## 2017-10-17 MED ORDER — AZITHROMYCIN 500 MG PO TABS: 500.0000 mg | ORAL_TABLET | Freq: Every day | ORAL | 0 refills | Status: DC

## 2017-10-17 MED ORDER — PREDNISONE 10 MG (21) PO TBPK: ORAL_TABLET | ORAL | 0 refills | Status: DC

## 2017-10-17 MED ORDER — AZITHROMYCIN 250 MG PO TABS: 500.0000 mg | ORAL_TABLET | Freq: Every day | ORAL | Status: DC

## 2017-10-17 NOTE — Discharge Summary (Signed)
Physician Discharge Summary  Jeff ImusKarl E Ray ZOX:[id number] DOB: 12/19/1955 DOA: 10/15/2017  PCP: [name], [name], MD  Admit date: 10/15/2017 Discharge date: 10/17/2017  Time spent: 45 minutes  Recommendations for Outpatient Follow-up:  -Will be discharged home today. -Advised to follow-up with primary care provider in 2 weeks. -Will complete 3 days of azithromycin as well as a prednisone taper upon discharge.  Discharge Diagnoses:  Principal Problem:   COPD exacerbation (HCC) Active Problems:   HTN (hypertension)   DM (diabetes mellitus) (HCC)   Tobacco abuse   Discharge Condition: Stable and improved  Filed Weights   10/15/17 1507 10/15/17 2111  Weight: 81.2 kg (179 lb) 80.4 kg (177 lb 4.8 oz)    History of present illness:  As per Dr. Mariea ClontsEmokpae on 1/12: Jeff ImusKarl E Ray is a 62 y.o. male with medical history significant for COPD, HTN, DM, Tobacco, who presented to the ED with complaints of cough-and shortness of breath of 3 days duration.  Cough is productive of greenish/yellowish sputum.  Patient reports bilateral lower rib pain and back pain.  He denies chest pain, no fever or chills.  Patient is on 2 L O2 at home.  Patient's quit smoking 2 years ago, previously smoked ~ 1PPD.  Denies illicit drug use or alcohol abuse.  No recent travel no sick contacts  ED Course: O2 sat 99% on nasal cannula, mild tachycardia 93- 111.  WBC normal at 8.2, troponin mildly elevated 0.03 chronically elevated , EKG showed right bundle branch block , LAFB, unchanged from prior .  Chest x-ray negative for acute abnormality. Patient was given prednisone in the ED although nebulizer, after ambulation patient's O2 sats dropped to 91%, shortness of breath, with increased O2 demands to 4 L.     Hospital Course:   Acute on chronic hypoxemic respiratory failure -On account of COPD with acute exacerbation -Improved clinical condition today, with no wheezing on lung auscultation. -Continue nebs, steroids,  empiric antibiotic therapy with azithromycin. -No evidence of pneumonia on chest x-ray.  Diabetes -CBGs elevated, likely on account of concomitant steroid use. -Improving.  Hypertension -Well-controlled, continue current management.  Elevated troponin -No chest pain, flat in the range of 0.04. -Elevated troponin likely due to acute COPD exacerbation.    Procedures:  None   Consultations:  None  Discharge Instructions  Discharge Instructions    Diet - low sodium heart healthy   Complete by:  As directed    Increase activity slowly   Complete by:  As directed      Allergies as of 10/17/2017   No Known Allergies     Medication List    TAKE these medications   ALPRAZolam 1 MG tablet Commonly known as:  XANAX Take 1 mg by mouth 3 (three) times daily as needed for anxiety.   ANORO ELLIPTA 62.5-25 MCG/INH Aepb Generic drug:  umeclidinium-vilanterol Take 1 puff by mouth daily.   azithromycin 500 MG tablet Commonly known as:  ZITHROMAX Take 1 tablet (500 mg total) by mouth daily.   fexofenadine-pseudoephedrine 180-240 MG 24 hr tablet Commonly known as:  ALLEGRA-D 24 Take 1 tablet by mouth daily.   fluticasone 50 MCG/ACT nasal spray Commonly known as:  FLONASE Place 2 sprays into both nostrils daily.   ibuprofen 800 MG tablet Commonly known as:  ADVIL,MOTRIN Take 800 mg by mouth daily as needed. For pain   ipratropium-albuterol 0.5-2.5 (3) MG/3ML Soln Commonly known as:  DUONEB Take 3 mLs by nebulization every 4 (four) hours as  needed. For shortness of breath   metFORMIN 500 MG tablet Commonly known as:  GLUCOPHAGE Take 1,000 mg by mouth 2 (two) times daily with a meal.   montelukast 10 MG tablet Commonly known as:  SINGULAIR Take 10 mg by mouth every morning.   MUCINEX MAXIMUM STRENGTH 1200 MG Tb12 Generic drug:  Guaifenesin Take 1 tablet by mouth 2 (two) times daily.   MYRBETRIQ 25 MG Tb24 tablet Generic drug:  mirabegron ER Take 25 mg by  mouth daily.   neomycin-polymyxin-hydrocortisone OTIC solution Commonly known as:  CORTISPORIN Place 1 drop into both ears 4 (four) times daily as needed for other. Wax buildup or irritation   pantoprazole 40 MG tablet Commonly known as:  PROTONIX Take 40 mg by mouth daily.   predniSONE 10 MG (21) Tbpk tablet Commonly known as:  STERAPRED UNI-PAK 21 TAB Take as directed   PROAIR HFA 108 (90 Base) MCG/ACT inhaler Generic drug:  albuterol Inhale 2 puffs into the lungs every 6 (six) hours as needed. Shortness of Breath   rosuvastatin 10 MG tablet Commonly known as:  CRESTOR Take 10 mg by mouth at bedtime.   VESICARE 10 MG tablet Generic drug:  solifenacin Take 10 mg by mouth at bedtime.      No Known Allergies Follow-up Information    Gareth Morgan, MD. Schedule an appointment as soon as possible for a visit on 10/31/2017.   Specialty:  Family Medicine Why:  10:40 am Contact information: [address] [place] [address] [phone]            The results of significant diagnostics from this hospitalization (including imaging, microbiology, ancillary and laboratory) are listed below for reference.    Significant Diagnostic Studies: Dg Chest 2 View  Result Date: 10/15/2017 CLINICAL DATA:  Shortness of breath, chest tightness EXAM: CHEST  2 VIEW COMPARISON:  09/17/2016 FINDINGS: There is hyperinflation of the lungs compatible with COPD. Heart is normal size. Chronic scarring in both lung bases. No effusions. No acute bony abnormality. IMPRESSION: COPD/chronic changes.  No active disease. Electronically Signed   By: Charlett Nose M.D.   On: 10/15/2017 16:03    Microbiology: Recent Results (from the past 240 hour(s))  Respiratory Panel by PCR     Status: None   Collection Time: 10/15/17 10:23 PM  Result Value Ref Range Status   Adenovirus NOT DETECTED NOT DETECTED Final   Coronavirus 229E NOT DETECTED NOT DETECTED Final   Coronavirus HKU1 NOT DETECTED NOT  DETECTED Final   Coronavirus NL63 NOT DETECTED NOT DETECTED Final   Coronavirus OC43 NOT DETECTED NOT DETECTED Final   Metapneumovirus NOT DETECTED NOT DETECTED Final   Rhinovirus / Enterovirus NOT DETECTED NOT DETECTED Final   Influenza A NOT DETECTED NOT DETECTED Final   Influenza B NOT DETECTED NOT DETECTED Final   Parainfluenza Virus 1 NOT DETECTED NOT DETECTED Final   Parainfluenza Virus 2 NOT DETECTED NOT DETECTED Final   Parainfluenza Virus 3 NOT DETECTED NOT DETECTED Final   Parainfluenza Virus 4 NOT DETECTED NOT DETECTED Final   Respiratory Syncytial Virus NOT DETECTED NOT DETECTED Final   Bordetella pertussis NOT DETECTED NOT DETECTED Final   Chlamydophila pneumoniae NOT DETECTED NOT DETECTED Final   Mycoplasma pneumoniae NOT DETECTED NOT DETECTED Final    Comment: Performed at [facility], [address] [place], [place] [address]     Labs: Basic Metabolic Panel: Recent Labs  Lab 10/15/17 1625 10/17/17 0538  NA 138 136  K  4.5 4.1  CL 98* 97*  CO2 28 30  GLUCOSE 297* 219*  BUN 11 13  CREATININE 1.02 0.89  CALCIUM 9.7 9.5   Liver Function Tests: No results for input(s): AST, ALT, ALKPHOS, BILITOT, PROT, ALBUMIN in the last 168 hours. No results for input(s): LIPASE, AMYLASE in the last 168 hours. No results for input(s): AMMONIA in the last 168 hours. CBC: Recent Labs  Lab 10/15/17 1625 10/17/17 0538  WBC 8.2 10.4  NEUTROABS 6.2  --   HGB 11.8* 11.6*  HCT 37.1* 36.3*  MCV 86.7 86.0  PLT 267 322   Cardiac Enzymes: Recent Labs  Lab 10/15/17 1625 10/15/17 2303 10/16/17 0535  TROPONINI 0.03* 0.04* 0.04*   BNP: BNP (last 3 results) No results for input(s): BNP in the last 8760 hours.  ProBNP (last 3 results) No results for input(s): PROBNP in the last 8760 hours.  CBG: Recent Labs  Lab 10/16/17 1120 10/16/17 1609 10/16/17 2056 10/17/17 0736 10/17/17 1107  GLUCAP 298* 275* 268* 203* 273*       Signed:  Chaya Jan  [facility] Pager: [phone] 10/17/2017, 2:44 PM

## 2017-10-17 NOTE — Progress Notes (Signed)
Patient discharged home with personal belongings. IV removed and site intact. Patient discharged with all prescriptions, and personal motorized wheelchair and O2 tank.

## 2017-10-17 NOTE — Progress Notes (Signed)
PHARMACIST - PHYSICIAN COMMUNICATION DR:   TRH CONCERNING: Antibiotic IV to Oral Route Change Policy  RECOMMENDATION: This patient is receiving Azithromycin by the intravenous route.  Based on criteria approved by the Pharmacy and Therapeutics Committee, the antibiotic(s) is/are being converted to the equivalent oral dose form(s).   DESCRIPTION: These criteria include:  Patient being treated for a respiratory tract infection, urinary tract infection, cellulitis or clostridium difficile associated diarrhea if on metronidazole  The patient is not neutropenic and does not exhibit a GI malabsorption state  The patient is eating (either orally or via tube) and/or has been taking other orally administered medications for a least 24 hours  The patient is improving clinically and has a Tmax < 100.5  If you have questions about this conversion, please contact the Pharmacy Department  [x]   [phone]( [phone] )  [facility] []   [phone]( [phone] )  [facility] []   [phone]( [phone] )  [facility] []   [phone]( [phone] )  [facility] []   [phone]( [phone] )  [facility]    Mady GemmaHayes, Magdaleno Lortie R, [facility] RoadRPH 10/17/2017 9:18 AM

## 2017-10-17 NOTE — Progress Notes (Addendum)
Inpatient Diabetes Program Recommendations  AACE/ADA: New Consensus Statement on Inpatient Glycemic Control (2015)  Target Ranges:  Prepandial:   less than 140 mg/dL      Peak postprandial:   less than 180 mg/dL (1-2 hours)      Critically ill patients:  140 - 180 mg/dL   Results for Jeff Ray, Jeff Ray (MRN [id number]) as of 10/17/2017 09:28  Ref. Range 10/16/2017 07:26 10/16/2017 11:20 10/16/2017 16:09 10/16/2017 20:56 10/17/2017 07:36  Glucose-Capillary Latest Ref Range: 65 - 99 mg/dL 119258 (H) 147298 (H) 829275 (H) 268 (H) 203 (H)  Results for Jeff Ray, Jeff Ray (MRN [id number]) as of 10/17/2017 09:28  Ref. Range 11/28/2015 14:09 10/16/2017 05:35  Hemoglobin A1C Latest Ref Range: 4.8 - 5.6 % 7.0 (H) 12.7 (H)   Review of Glycemic Control  Diabetes history: DM2 Outpatient Diabetes medications: Metformin 1000 mg BID Current orders for Inpatient glycemic control: Novolog 0-20 units TID with meals, Novolog 0-5 units QHS, Novolog 6 units TID with meals; Prednisone 40 mg QAM  Inpatient Diabetes Program Recommendations:  Insulin - Basal: If steroids are continued, please consider ordering Lantus 8 units Q24H (based on 80 kg x 0.1 units). HgbA1C: A1C 12.7% on 10/16/17 indicating an average glucose of 318 mg/dl over the past 2-3 months. Patient needs to follow up with PCP or Endocrinologist regarding DM control. Patient will need additional DM medications prescribed at time of discharge.  10/17/17@15 :39-Noted patient has been discharged today at 13:51. Called patient over phone and discussed A1C and DM control.  Patient reports that he is followed by PCP for diabetes management and currently he takes Metformin 1000 mg BID as an outpatient for diabetes control. Patient reports that he is taking Metformin as prescribed. Patient reports that he has having stomach issues with Metformin so his PCP stopped the Metformin and prescribed Glimepiride instead.  Patient states that he stopped taking the Glimperide a couple of months ago  and re-started taking 2 tablets of Metformin BID (instead of 1 tablet).  Patient states that he felt that his glucose was running to high with the Glimepiride so he resumed the Metformin and took additional tablets. Patient reports that he informed PCP that he stopped the Glimepiride and restarted the Metformin 1000 mg BID.  Discussed Metformin and Glimepiride in more detail and explained how they work for DM control. Patient states that he checks his glucose 3-4 times per day and over the past few weeks it has been running higher in the 300's mg/dl as the day goes on his glucose goes up. Patient denies any steroids outpatient over the past few months. Discussed A1C results (12.7% on 10/16/17) and explained that his current A1C indicates an average glucose of 318 mg/dl over the past 2-3 months. Discussed glucose and A1C goals. Informed patient that he will need more than Metformin for DM control especially with elevated A1C, hyperglycemia at home, and now ordered steroid taper. Discussed importance of checking CBGs and maintaining good CBG control to prevent long-term and short-term complications. Explained how hyperglycemia leads to damage within blood vessels which lead to the common complications seen with uncontrolled diabetes. Stressed to the patient the importance of improving glycemic control to prevent further complications from uncontrolled diabetes. Discussed impact of nutrition, exercise, stress, sickness, and medications on diabetes control. Noted patient was discharged on Prednisone taper. Informed patient that glucose will likely be more elevated with steroids. Patient has a follow up appointment with PCP in about 2 weeks. Encouraged patient to call his PCP and  ask for advice on glycemic control or see if he needs a follow up appointment this week.  Encouraged patient to check his glucose 4 times per day (before meals and at bedtime) and to keep a log book of glucose readings taken which he will need  to take to doctor appointments. Explained how PCP can use the log book to continue to make adjustments with DM medications if needed. Patient verbalized understanding of information discussed and he states that he has no further questions at this time related to diabetes.  Thanks, Orlando Penner, RN, MSN, CDE Diabetes Coordinator Inpatient Diabetes Program [phone] (Team Pager from 8am to 5pm)

## 2018-02-23 ENCOUNTER — Encounter (HOSPITAL_COMMUNITY): Payer: Self-pay

## 2018-02-23 ENCOUNTER — Other Ambulatory Visit: Payer: Self-pay

## 2018-02-23 ENCOUNTER — Emergency Department (HOSPITAL_COMMUNITY)
Admission: EM | Admit: 2018-02-23 | Discharge: 2018-02-23 | Disposition: A | Discharge status: Home / Self Care | Payer: Medicare HMO | Attending: Emergency Medicine | Admitting: Emergency Medicine

## 2018-02-23 DIAGNOSIS — E119 Type 2 diabetes mellitus without complications: Secondary | ICD-10-CM | POA: Diagnosis not present

## 2018-02-23 DIAGNOSIS — S30861A Insect bite (nonvenomous) of abdominal wall, initial encounter: Secondary | ICD-10-CM | POA: Diagnosis not present

## 2018-02-23 DIAGNOSIS — Z79899 Other long term (current) drug therapy: Secondary | ICD-10-CM | POA: Diagnosis not present

## 2018-02-23 DIAGNOSIS — Y939 Activity, unspecified: Secondary | ICD-10-CM | POA: Insufficient documentation

## 2018-02-23 DIAGNOSIS — Z87891 Personal history of nicotine dependence: Secondary | ICD-10-CM | POA: Diagnosis not present

## 2018-02-23 DIAGNOSIS — I10 Essential (primary) hypertension: Secondary | ICD-10-CM | POA: Insufficient documentation

## 2018-02-23 DIAGNOSIS — Y999 Unspecified external cause status: Secondary | ICD-10-CM | POA: Diagnosis not present

## 2018-02-23 DIAGNOSIS — Z7984 Long term (current) use of oral hypoglycemic drugs: Secondary | ICD-10-CM | POA: Insufficient documentation

## 2018-02-23 DIAGNOSIS — J069 Acute upper respiratory infection, unspecified: Secondary | ICD-10-CM | POA: Diagnosis not present

## 2018-02-23 DIAGNOSIS — W57XXXA Bitten or stung by nonvenomous insect and other nonvenomous arthropods, initial encounter: Secondary | ICD-10-CM | POA: Insufficient documentation

## 2018-02-23 DIAGNOSIS — Y929 Unspecified place or not applicable: Secondary | ICD-10-CM | POA: Insufficient documentation

## 2018-02-23 DIAGNOSIS — R05 Cough: Secondary | ICD-10-CM | POA: Diagnosis present

## 2018-02-23 DIAGNOSIS — J449 Chronic obstructive pulmonary disease, unspecified: Secondary | ICD-10-CM | POA: Diagnosis not present

## 2018-02-23 MED ORDER — DOXYCYCLINE HYCLATE 100 MG PO CAPS: 100.0000 mg | ORAL_CAPSULE | Freq: Two times a day (BID) | ORAL | 0 refills | Status: DC

## 2018-02-23 MED ORDER — DOXYCYCLINE HYCLATE 100 MG PO TABS
100.0000 mg | ORAL_TABLET | Freq: Once | ORAL | Status: AC
  Administered 2018-02-23: 100 mg via ORAL
  Filled 2018-02-23: qty 1

## 2018-02-23 NOTE — ED Triage Notes (Signed)
Pt was bitten by a tick two days ago and states he is feeling symptomatic. Is complaining of a cough, sore throat, as well as nasal drainage. Pt is on 2L O2 at all times. Pt is also stating he is weak. Pt isn't sure if it's seasonal allergies or tick bite.

## 2018-02-23 NOTE — ED Provider Notes (Signed)
[facility] EMERGENCY DEPARTMENT Provider Note   CSN: [id number] Arrival date & time: 02/23/18  2007     History   Chief Complaint Chief Complaint  Patient presents with  . Insect Bite    HPI Jeff Ray is a 62 y.o. male.  The history is provided by the patient.  He has history of COPD, diabetes, hypertension and comes in with concerns that he might have come down with an infection because of a tick bite.  He was bitten in the left inguinal area 4 days ago.  Yesterday, he started having a cough productive of greenish sputum and associated with generalized malaise.  He denies fever, chills, sweats.  He denies arthralgias or myalgias.  He denies any rash.  Past Medical History:  Diagnosis Date  . Anxiety   . Asthma   . COPD (chronic obstructive pulmonary disease) (HCC)   . Diabetes mellitus   . History of nuclear stress test 02/2016   low risk study  . Hypertension   . On home O2     Patient Active Problem List   Diagnosis Date Noted  . Anemia 08/03/2015  . CAP (community acquired pneumonia) 06/02/2013  . Respiratory failure, acute and chronic (HCC) 05/26/2013  . COPD exacerbation (HCC) 05/26/2013  . HTN (hypertension) 05/26/2013  . DM (diabetes mellitus) (HCC) 05/26/2013  . Tobacco abuse 05/26/2013    Past Surgical History:  Procedure Laterality Date  . BACK SURGERY          Home Medications    Prior to Admission medications   Medication Sig Start Date End Date Taking? Authorizing Provider  albuterol (PROAIR HFA) 108 (90 BASE) MCG/ACT inhaler Inhale 2 puffs into the lungs every 6 (six) hours as needed. Shortness of Breath   Yes [provider]  ALPRAZolam (XANAX) 1 MG tablet Take 1 mg by mouth 3 (three) times daily as needed for anxiety. 09/30/17  Yes [provider]  Ernestina Patches 62.5-25 MCG/INH AEPB Take 1 puff by mouth daily. 08/31/17  Yes [provider]  fesoterodine (TOVIAZ) 4 MG TB24 tablet Take 4 mg by mouth daily.    Yes [provider]  fexofenadine-pseudoephedrine (ALLEGRA-D 24) 180-240 MG 24 hr tablet Take 1 tablet by mouth daily.   Yes [provider]  fluticasone (FLONASE) 50 MCG/ACT nasal spray Place 2 sprays into both nostrils daily. 09/30/17  Yes [provider]  glimepiride (AMARYL) 2 MG tablet Take 2 mg by mouth daily with breakfast.   Yes [provider]  Guaifenesin (MUCINEX MAXIMUM STRENGTH) 1200 MG TB12 Take 1 tablet by mouth daily.    Yes [provider]  ibuprofen (ADVIL,MOTRIN) 800 MG tablet Take 800 mg by mouth every 8 (eight) hours as needed for mild pain or moderate pain.   Yes [provider]  ipratropium-albuterol (DUONEB) 0.5-2.5 (3) MG/3ML SOLN Take 3 mLs by nebulization every 4 (four) hours as needed. For shortness of breath   Yes [provider]  metFORMIN (GLUCOPHAGE-XR) 500 MG 24 hr tablet Take 500 mg by mouth 2 (two) times daily.   Yes [provider]  montelukast (SINGULAIR) 10 MG tablet Take 10 mg by mouth every morning.    Yes [provider]  naproxen (NAPROSYN) 500 MG tablet Take 500 mg by mouth 2 (two) times daily.  02/22/18  Yes [provider]  neomycin-polymyxin-hydrocortisone (CORTISPORIN) OTIC solution Place 1 drop into both ears 4 (four) times daily as needed for other. Wax buildup or irritation 09/30/17  Yes [provider]  pantoprazole (PROTONIX) 40 MG tablet Take 40 mg by mouth daily.   Yes [provider]  rosuvastatin (CRESTOR) 40 MG tablet Take 40 mg by mouth at bedtime.  07/24/17  Yes [provider]    Family History Family History  Problem Relation Age of Onset  . Diabetes Mother     Social History Social History   Tobacco Use  . Smoking status: Former Smoker    Packs/day: 0.00    Years: 10.00    Pack years: 0.00    Types: Cigarettes    Last attempt to quit: 10/20/2012    Years since quitting: 5.3  . Smokeless tobacco: Never Used    Substance Use Topics  . Alcohol use: No    Alcohol/week: 0.0 oz  . Drug use: No     Allergies   Patient has no known allergies.   Review of Systems Review of Systems  All other systems reviewed and are negative.    Physical Exam Updated Vital Signs BP 119/83 (BP Location: Right Arm)   Pulse (!) 110   Temp 98 F (36.7 C) (Oral)   Resp 18   Ht  (1.88 m)   Wt 85.3 kg (188 lb)   SpO2 99%   BMI 24.14 kg/m   Physical Exam  Nursing note and vitals reviewed.  62 year old male, resting comfortably and in no acute distress. Vital signs are significant for mildly elevated heart rate. Oxygen saturation is 99%, which is normal. Head is normocephalic and atraumatic. PERRLA, EOMI. Oropharynx is clear. Neck is nontender and supple without adenopathy or JVD. Back is nontender and there is no CVA tenderness. Lungs are clear without rales, wheezes, or rhonchi. Chest is nontender. Heart has regular rate and rhythm without murmur. Abdomen is soft, flat, nontender without masses or hepatosplenomegaly and peristalsis is normoactive. Extremities have no cyanosis or edema, full range of motion is present. Skin is warm and dry without rash. Neurologic: Mental status is normal, cranial nerves are intact, there are no motor or sensory deficits.  ED Treatments / Results   Procedures Procedures   Medications Ordered in ED Medications  doxycycline (VIBRA-TABS) tablet 100 mg (100 mg Oral Given 02/23/18 2306)     Initial Impression / Assessment and Plan / ED Course  I have reviewed the triage vital signs and the nursing notes.  Respiratory tract infection, probably viral.  Tick bite.  He will be placed on doxycycline because of the tick bite.  No indication for chest x-ray given the fact that he will be put on antibiotics regardless of findings.  Follow-up with PCP as needed.  Final Clinical Impressions(s) / ED Diagnoses   Final diagnoses:  Viral upper respiratory tract infection   Tick bite of groin, initial encounter    ED Discharge Orders        Ordered    doxycycline (VIBRAMYCIN) 100 MG capsule  2 times daily     02/23/18 2304       Dione Booze, MD 02/23/18 2307

## 2018-04-19 ENCOUNTER — Ambulatory Visit: Payer: Medicare HMO | Admitting: Urology

## 2018-04-19 DIAGNOSIS — N3281 Overactive bladder: Secondary | ICD-10-CM | POA: Diagnosis not present

## 2018-04-19 DIAGNOSIS — N5201 Erectile dysfunction due to arterial insufficiency: Secondary | ICD-10-CM | POA: Diagnosis not present

## 2018-04-19 DIAGNOSIS — N401 Enlarged prostate with lower urinary tract symptoms: Secondary | ICD-10-CM | POA: Diagnosis not present

## 2018-05-31 ENCOUNTER — Ambulatory Visit: Payer: Medicare HMO | Admitting: Urology

## 2018-05-31 DIAGNOSIS — N3281 Overactive bladder: Secondary | ICD-10-CM | POA: Diagnosis not present

## 2018-05-31 DIAGNOSIS — N5201 Erectile dysfunction due to arterial insufficiency: Secondary | ICD-10-CM | POA: Diagnosis not present

## 2018-05-31 DIAGNOSIS — N401 Enlarged prostate with lower urinary tract symptoms: Secondary | ICD-10-CM | POA: Diagnosis not present

## 2018-08-30 ENCOUNTER — Ambulatory Visit: Payer: Medicare HMO | Admitting: Urology

## 2018-08-30 DIAGNOSIS — N5201 Erectile dysfunction due to arterial insufficiency: Secondary | ICD-10-CM

## 2018-08-30 DIAGNOSIS — N3281 Overactive bladder: Secondary | ICD-10-CM | POA: Diagnosis not present

## 2018-08-30 DIAGNOSIS — N401 Enlarged prostate with lower urinary tract symptoms: Secondary | ICD-10-CM

## 2018-09-07 ENCOUNTER — Encounter (HOSPITAL_COMMUNITY): Payer: Self-pay | Admitting: Emergency Medicine

## 2018-09-07 ENCOUNTER — Other Ambulatory Visit: Payer: Self-pay

## 2018-09-07 ENCOUNTER — Emergency Department (HOSPITAL_COMMUNITY): Payer: Medicare Other

## 2018-09-07 ENCOUNTER — Inpatient Hospital Stay (HOSPITAL_COMMUNITY)
Admission: EM | Admit: 2018-09-07 | Discharge: 2018-09-10 | DRG: 191 | Disposition: A | Discharge status: Home / Self Care | Payer: Medicare Other | Attending: Internal Medicine | Admitting: Internal Medicine

## 2018-09-07 DIAGNOSIS — T380X5A Adverse effect of glucocorticoids and synthetic analogues, initial encounter: Secondary | ICD-10-CM | POA: Diagnosis not present

## 2018-09-07 DIAGNOSIS — I493 Ventricular premature depolarization: Secondary | ICD-10-CM | POA: Diagnosis not present

## 2018-09-07 DIAGNOSIS — F419 Anxiety disorder, unspecified: Secondary | ICD-10-CM | POA: Diagnosis not present

## 2018-09-07 DIAGNOSIS — D649 Anemia, unspecified: Secondary | ICD-10-CM | POA: Diagnosis not present

## 2018-09-07 DIAGNOSIS — Z7951 Long term (current) use of inhaled steroids: Secondary | ICD-10-CM

## 2018-09-07 DIAGNOSIS — J45909 Unspecified asthma, uncomplicated: Secondary | ICD-10-CM

## 2018-09-07 DIAGNOSIS — Z9981 Dependence on supplemental oxygen: Secondary | ICD-10-CM

## 2018-09-07 DIAGNOSIS — N400 Enlarged prostate without lower urinary tract symptoms: Secondary | ICD-10-CM | POA: Diagnosis not present

## 2018-09-07 DIAGNOSIS — I4510 Unspecified right bundle-branch block: Secondary | ICD-10-CM | POA: Diagnosis present

## 2018-09-07 DIAGNOSIS — I10 Essential (primary) hypertension: Secondary | ICD-10-CM | POA: Diagnosis not present

## 2018-09-07 DIAGNOSIS — Z79899 Other long term (current) drug therapy: Secondary | ICD-10-CM

## 2018-09-07 DIAGNOSIS — R6510 Systemic inflammatory response syndrome (SIRS) of non-infectious origin without acute organ dysfunction: Secondary | ICD-10-CM | POA: Diagnosis not present

## 2018-09-07 DIAGNOSIS — R0603 Acute respiratory distress: Secondary | ICD-10-CM

## 2018-09-07 DIAGNOSIS — R778 Other specified abnormalities of plasma proteins: Secondary | ICD-10-CM

## 2018-09-07 DIAGNOSIS — Z833 Family history of diabetes mellitus: Secondary | ICD-10-CM | POA: Diagnosis not present

## 2018-09-07 DIAGNOSIS — R0602 Shortness of breath: Secondary | ICD-10-CM | POA: Diagnosis present

## 2018-09-07 DIAGNOSIS — J441 Chronic obstructive pulmonary disease with (acute) exacerbation: Secondary | ICD-10-CM | POA: Diagnosis not present

## 2018-09-07 DIAGNOSIS — J9611 Chronic respiratory failure with hypoxia: Secondary | ICD-10-CM | POA: Diagnosis present

## 2018-09-07 DIAGNOSIS — Z87891 Personal history of nicotine dependence: Secondary | ICD-10-CM | POA: Diagnosis not present

## 2018-09-07 DIAGNOSIS — E1165 Type 2 diabetes mellitus with hyperglycemia: Secondary | ICD-10-CM | POA: Diagnosis not present

## 2018-09-07 DIAGNOSIS — R7989 Other specified abnormal findings of blood chemistry: Secondary | ICD-10-CM

## 2018-09-07 DIAGNOSIS — Z993 Dependence on wheelchair: Secondary | ICD-10-CM

## 2018-09-07 DIAGNOSIS — E785 Hyperlipidemia, unspecified: Secondary | ICD-10-CM | POA: Diagnosis present

## 2018-09-07 DIAGNOSIS — K219 Gastro-esophageal reflux disease without esophagitis: Secondary | ICD-10-CM | POA: Diagnosis not present

## 2018-09-07 DIAGNOSIS — B9719 Other enterovirus as the cause of diseases classified elsewhere: Secondary | ICD-10-CM | POA: Diagnosis present

## 2018-09-07 DIAGNOSIS — E119 Type 2 diabetes mellitus without complications: Secondary | ICD-10-CM

## 2018-09-07 DIAGNOSIS — I248 Other forms of acute ischemic heart disease: Secondary | ICD-10-CM | POA: Diagnosis present

## 2018-09-07 DIAGNOSIS — Z7984 Long term (current) use of oral hypoglycemic drugs: Secondary | ICD-10-CM | POA: Diagnosis not present

## 2018-09-07 LAB — TROPONIN I
Troponin I: 0.08 ng/mL (ref <0.03)
Troponin I: 0.08 ng/mL (ref <0.03)

## 2018-09-07 LAB — CBC WITH DIFFERENTIAL/PLATELET
ABS IMMATURE GRANULOCYTES: 0.02 10*3/uL (ref 0.00–0.07)
BASOS PCT: 0 %
Basophils Absolute: 0 10*3/uL (ref 0.0–0.1)
Eosinophils Absolute: 0.3 10*3/uL (ref 0.0–0.5)
Eosinophils Relative: 4 %
HCT: 37.7 % — ABNORMAL LOW (ref 39.0–52.0)
HEMOGLOBIN: 11.5 g/dL — AB (ref 13.0–17.0)
Immature Granulocytes: 0 %
Lymphocytes Relative: 27 %
Lymphs Abs: 2 10*3/uL (ref 0.7–4.0)
MCH: 26.7 pg (ref 26.0–34.0)
MCHC: 30.5 g/dL (ref 30.0–36.0)
MCV: 87.7 fL (ref 80.0–100.0)
MONO ABS: 0.7 10*3/uL (ref 0.1–1.0)
Monocytes Relative: 10 %
NEUTROS ABS: 4.4 10*3/uL (ref 1.7–7.7)
NEUTROS PCT: 59 %
PLATELETS: 229 10*3/uL (ref 150–400)
RBC: 4.3 MIL/uL (ref 4.22–5.81)
RDW: 14 % (ref 11.5–15.5)
WBC: 7.4 10*3/uL (ref 4.0–10.5)
nRBC: 0 % (ref 0.0–0.2)

## 2018-09-07 LAB — COMPREHENSIVE METABOLIC PANEL
ALBUMIN: 3.9 g/dL (ref 3.5–5.0)
ALK PHOS: 78 U/L (ref 38–126)
ALT: 20 U/L (ref 0–44)
AST: 20 U/L (ref 15–41)
Anion gap: 9 (ref 5–15)
BUN: 10 mg/dL (ref 8–23)
CALCIUM: 9.1 mg/dL (ref 8.9–10.3)
CHLORIDE: 97 mmol/L — AB (ref 98–111)
CO2: 27 mmol/L (ref 22–32)
CREATININE: 1.03 mg/dL (ref 0.61–1.24)
GFR calc non Af Amer: >60 mL/min (ref >60)
GLUCOSE: 168 mg/dL — AB (ref 70–99)
Potassium: 3.8 mmol/L (ref 3.5–5.1)
SODIUM: 133 mmol/L — AB (ref 135–145)
Total Bilirubin: 0.5 mg/dL (ref 0.3–1.2)
Total Protein: 7.8 g/dL (ref 6.5–8.1)

## 2018-09-07 LAB — URINALYSIS, ROUTINE W REFLEX MICROSCOPIC
Bilirubin Urine: NEGATIVE
Glucose, UA: NEGATIVE mg/dL
Hgb urine dipstick: NEGATIVE
Ketones, ur: NEGATIVE mg/dL
Leukocytes, UA: NEGATIVE
Nitrite: NEGATIVE
Protein, ur: NEGATIVE mg/dL
Specific Gravity, Urine: 1.019 (ref 1.005–1.030)
pH: 6 (ref 5.0–8.0)

## 2018-09-07 LAB — INFLUENZA PANEL BY PCR (TYPE A & B)
INFLBPCR: NEGATIVE
Influenza A By PCR: NEGATIVE

## 2018-09-07 LAB — GLUCOSE, CAPILLARY: Glucose-Capillary: 218 mg/dL — ABNORMAL HIGH (ref 70–99)

## 2018-09-07 LAB — CG4 I-STAT (LACTIC ACID): Lactic Acid, Venous: 1.68 mmol/L (ref 0.5–1.9)

## 2018-09-07 LAB — BRAIN NATRIURETIC PEPTIDE: B NATRIURETIC PEPTIDE 5: 28 pg/mL (ref 0.0–100.0)

## 2018-09-07 MED ORDER — PREDNISONE 20 MG PO TABS
40.0000 mg | ORAL_TABLET | Freq: Every day | ORAL | Status: DC
  Administered 2018-09-08: 40 mg via ORAL
  Filled 2018-09-07: qty 2

## 2018-09-07 MED ORDER — FLUTICASONE PROPIONATE 50 MCG/ACT NA SUSP
2.0000 | Freq: Every day | NASAL | Status: DC
  Administered 2018-09-08 – 2018-09-10 (×3): 2 via NASAL

## 2018-09-07 MED ORDER — METHYLPREDNISOLONE SODIUM SUCC 125 MG IJ SOLR
125.0000 mg | Freq: Once | INTRAMUSCULAR | Status: AC
  Administered 2018-09-07: 125 mg via INTRAVENOUS
  Filled 2018-09-07: qty 2

## 2018-09-07 MED ORDER — LORATADINE 10 MG PO TABS
10.0000 mg | ORAL_TABLET | Freq: Every day | ORAL | Status: DC
  Administered 2018-09-08 – 2018-09-10 (×3): 10 mg via ORAL
  Filled 2018-09-07 (×3): qty 1

## 2018-09-07 MED ORDER — ENOXAPARIN SODIUM 40 MG/0.4ML ~~LOC~~ SOLN
40.0000 mg | SUBCUTANEOUS | Status: DC
  Administered 2018-09-07 – 2018-09-09 (×3): 40 mg via SUBCUTANEOUS
  Filled 2018-09-07 (×3): qty 0.4

## 2018-09-07 MED ORDER — ALBUTEROL (5 MG/ML) CONTINUOUS INHALATION SOLN
INHALATION_SOLUTION | RESPIRATORY_TRACT | Status: AC
  Administered 2018-09-07: 17:00:00
  Filled 2018-09-07: qty 20

## 2018-09-07 MED ORDER — ALPRAZOLAM 1 MG PO TABS: 1.0000 mg | ORAL_TABLET | Freq: Three times a day (TID) | ORAL | Status: DC | PRN

## 2018-09-07 MED ORDER — MONTELUKAST SODIUM 10 MG PO TABS
10.0000 mg | ORAL_TABLET | Freq: Every morning | ORAL | Status: DC
  Administered 2018-09-08 – 2018-09-10 (×3): 10 mg via ORAL
  Filled 2018-09-07 (×3): qty 1

## 2018-09-07 MED ORDER — ALBUTEROL SULFATE (2.5 MG/3ML) 0.083% IN NEBU
10.0000 mg | INHALATION_SOLUTION | Freq: Once | RESPIRATORY_TRACT | Status: AC
  Administered 2018-09-07: 10 mg via RESPIRATORY_TRACT

## 2018-09-07 MED ORDER — SODIUM CHLORIDE 0.9 % IV SOLN
500.0000 mg | INTRAVENOUS | Status: DC
  Administered 2018-09-07: 500 mg via INTRAVENOUS
  Filled 2018-09-07: qty 500

## 2018-09-07 MED ORDER — IPRATROPIUM-ALBUTEROL 0.5-2.5 (3) MG/3ML IN SOLN
RESPIRATORY_TRACT | Status: AC
  Filled 2018-09-07: qty 3

## 2018-09-07 MED ORDER — IPRATROPIUM-ALBUTEROL 0.5-2.5 (3) MG/3ML IN SOLN: 3.0000 mL | Freq: Four times a day (QID) | RESPIRATORY_TRACT | Status: DC

## 2018-09-07 MED ORDER — PANTOPRAZOLE SODIUM 40 MG PO TBEC
40.0000 mg | DELAYED_RELEASE_TABLET | Freq: Every morning | ORAL | Status: DC
  Administered 2018-09-08 – 2018-09-10 (×3): 40 mg via ORAL
  Filled 2018-09-07 (×3): qty 1

## 2018-09-07 MED ORDER — ACETAMINOPHEN 500 MG PO TABS
1000.0000 mg | ORAL_TABLET | Freq: Once | ORAL | Status: AC
  Administered 2018-09-07: 1000 mg via ORAL
  Filled 2018-09-07: qty 2

## 2018-09-07 MED ORDER — AZITHROMYCIN 250 MG PO TABS
250.0000 mg | ORAL_TABLET | Freq: Every day | ORAL | Status: DC
  Administered 2018-09-08 – 2018-09-10 (×3): 250 mg via ORAL
  Filled 2018-09-07 (×3): qty 1

## 2018-09-07 MED ORDER — IPRATROPIUM BROMIDE 0.02 % IN SOLN
0.5000 mg | Freq: Four times a day (QID) | RESPIRATORY_TRACT | Status: DC
  Administered 2018-09-07 – 2018-09-10 (×11): 0.5 mg via RESPIRATORY_TRACT
  Filled 2018-09-07 (×11): qty 2.5

## 2018-09-07 MED ORDER — ALFUZOSIN HCL ER 10 MG PO TB24
10.0000 mg | ORAL_TABLET | Freq: Every day | ORAL | Status: DC
  Administered 2018-09-08: 10 mg via ORAL
  Filled 2018-09-07 (×5): qty 1

## 2018-09-07 MED ORDER — INSULIN ASPART 100 UNIT/ML ~~LOC~~ SOLN
0.0000 [IU] | Freq: Three times a day (TID) | SUBCUTANEOUS | Status: DC
  Administered 2018-09-08: 3 [IU] via SUBCUTANEOUS

## 2018-09-07 MED ORDER — IPRATROPIUM-ALBUTEROL 0.5-2.5 (3) MG/3ML IN SOLN
3.0000 mL | Freq: Once | RESPIRATORY_TRACT | Status: AC
  Administered 2018-09-07: 3 mL via RESPIRATORY_TRACT

## 2018-09-07 MED ORDER — LEVALBUTEROL HCL 0.63 MG/3ML IN NEBU
0.6300 mg | INHALATION_SOLUTION | Freq: Four times a day (QID) | RESPIRATORY_TRACT | Status: DC
  Administered 2018-09-07 – 2018-09-10 (×11): 0.63 mg via RESPIRATORY_TRACT
  Filled 2018-09-07 (×11): qty 3

## 2018-09-07 MED ORDER — ALBUTEROL SULFATE (2.5 MG/3ML) 0.083% IN NEBU
5.0000 mg | INHALATION_SOLUTION | Freq: Once | RESPIRATORY_TRACT | Status: DC
  Filled 2018-09-07: qty 6

## 2018-09-07 MED ORDER — SODIUM CHLORIDE 0.9 % IV SOLN
2.0000 g | INTRAVENOUS | Status: DC
  Administered 2018-09-07: 2 g via INTRAVENOUS
  Filled 2018-09-07: qty 20

## 2018-09-07 MED ORDER — GUAIFENESIN ER 600 MG PO TB12
600.0000 mg | ORAL_TABLET | Freq: Two times a day (BID) | ORAL | Status: DC
  Administered 2018-09-07 – 2018-09-08 (×2): 600 mg via ORAL
  Filled 2018-09-07 (×3): qty 1

## 2018-09-07 MED ORDER — FINASTERIDE 5 MG PO TABS
5.0000 mg | ORAL_TABLET | Freq: Every day | ORAL | Status: DC
  Administered 2018-09-08 – 2018-09-10 (×3): 5 mg via ORAL
  Filled 2018-09-07 (×4): qty 1

## 2018-09-07 MED ORDER — ROSUVASTATIN CALCIUM 20 MG PO TABS
40.0000 mg | ORAL_TABLET | Freq: Every day | ORAL | Status: DC
  Administered 2018-09-07 – 2018-09-09 (×3): 40 mg via ORAL
  Filled 2018-09-07 (×3): qty 2

## 2018-09-07 NOTE — ED Notes (Signed)
Patient transported to X-ray 

## 2018-09-07 NOTE — ED Notes (Signed)
Having trouble attempting IV.

## 2018-09-07 NOTE — ED Notes (Signed)
Able to obtain 1 IV, Unable to get blood work. Lab notified, sending over phlebotomist.

## 2018-09-07 NOTE — ED Provider Notes (Signed)
[facility] EMERGENCY DEPARTMENT Provider Note   CSN: [id number] Arrival date & time: 09/07/18  1614     History   Chief Complaint Chief Complaint  Patient presents with  . Shortness of Breath    HPI Jeff Ray is a 62 y.o. male.  HPI  The patient is a 62 year old male, he has a known history of COPD, he is a diabetic, he had a low risk nuclear stress test in the past.  Because of his severe reactive airway disease COPD and asthma he currently uses oxygen 2 L 24/7, he also gets around in a motorized wheelchair because of increasing shortness of breath however he has noticed that over the last several days he has had increasing dyspnea on exertion, shortness of breath with even talking, eating and is becoming severely dyspneic around his house.  He feels like he might have a fever but has not measured 1, denies any swelling of the legs but does endorse some coughing with some sputum.  This is gradually worsening and is now become severe.  He has been using his home albuterol treatments with minimal improvement.  Past Medical History:  Diagnosis Date  . Anxiety   . Asthma   . COPD (chronic obstructive pulmonary disease) (HCC)   . Diabetes mellitus   . History of nuclear stress test 02/2016   low risk study  . Hypertension   . On home O2     Patient Active Problem List   Diagnosis Date Noted  . Anemia 08/03/2015  . CAP (community acquired pneumonia) 06/02/2013  . Respiratory failure, acute and chronic (HCC) 05/26/2013  . COPD exacerbation (HCC) 05/26/2013  . HTN (hypertension) 05/26/2013  . DM (diabetes mellitus) (HCC) 05/26/2013  . Tobacco abuse 05/26/2013    Past Surgical History:  Procedure Laterality Date  . BACK SURGERY          Home Medications    Prior to Admission medications   Medication Sig Start Date End Date Taking? Authorizing Provider  albuterol (PROAIR HFA) 108 (90 BASE) MCG/ACT inhaler Inhale 2 puffs into the lungs every 6 (six) hours as  needed for wheezing or shortness of breath. Shortness of Breath   Yes [provider]  alfuzosin (UROXATRAL) 10 MG 24 hr tablet Take 10 mg by mouth daily with breakfast.   Yes [provider]  ALPRAZolam (XANAX) 1 MG tablet Take 1 mg by mouth 3 (three) times daily as needed for anxiety. 09/30/17  Yes [provider]  Ernestina Patches 62.5-25 MCG/INH AEPB Take 1 puff by mouth daily. 08/31/17  Yes [provider]  fexofenadine-pseudoephedrine (ALLEGRA-D 24) 180-240 MG 24 hr tablet Take 1 tablet by mouth daily.   Yes [provider]  finasteride (PROSCAR) 5 MG tablet Take 5 mg by mouth daily.   Yes [provider]  fluticasone (FLONASE) 50 MCG/ACT nasal spray Place 2 sprays into both nostrils daily. 09/30/17  Yes [provider]  glimepiride (AMARYL) 2 MG tablet Take 2 mg by mouth daily with breakfast.   Yes [provider]  Guaifenesin (MUCINEX MAXIMUM STRENGTH) 1200 MG TB12 Take 1 tablet by mouth 2 (two) times daily.    Yes [provider]  ibuprofen (ADVIL,MOTRIN) 800 MG tablet Take 800 mg by mouth every 8 (eight) hours as needed for mild pain or moderate pain.   Yes [provider]  ipratropium-albuterol (DUONEB) 0.5-2.5 (3) MG/3ML SOLN Take 3 mLs by nebulization every 4 (four) hours as needed. For shortness of breath  Yes [provider]  metFORMIN (GLUCOPHAGE-XR) 500 MG 24 hr tablet Take 1,000 mg by mouth 2 (two) times daily.    Yes [provider]  montelukast (SINGULAIR) 10 MG tablet Take 10 mg by mouth every morning.    Yes [provider]  pantoprazole (PROTONIX) 40 MG tablet Take 40 mg by mouth every morning.    Yes [provider]  rosuvastatin (CRESTOR) 40 MG tablet Take 40 mg by mouth at bedtime.  07/24/17  Yes [provider]  levofloxacin (LEVAQUIN) 500 MG tablet Take 500 mg by mouth daily. 10 day course starting on 08/16/2018 08/16/18   [provider]    Family History Family History  Problem Relation Age of Onset  . Diabetes Mother     Social History Social History   Tobacco Use  . Smoking status: Former Smoker    Packs/day: 0.00    Years: 10.00    Pack years: 0.00    Types: Cigarettes    Last attempt to quit: 10/20/2012    Years since quitting: 5.8  . Smokeless tobacco: Never Used  Substance Use Topics  . Alcohol use: No    Alcohol/week: 0.0 standard drinks  . Drug use: No     Allergies   Patient has no known allergies.   Review of Systems Review of Systems  All other systems reviewed and are negative.    Physical Exam Updated Vital Signs BP 121/72   Pulse (!) 112   Temp 100.3 F (37.9 C) (Temporal)   Resp (!) 21   Ht 1.88 m (6\' 2" )   Wt 85.7 kg   SpO2 97%   BMI 24.27 kg/m   Physical Exam  Constitutional: He appears well-developed and well-nourished. No distress.  HENT:  Head: Normocephalic and atraumatic.  Mouth/Throat: Oropharynx is clear and moist. No oropharyngeal exudate.  Eyes: Pupils are equal, round, and reactive to light. Conjunctivae and EOM are normal. Right eye exhibits no discharge. Left eye exhibits no discharge. No scleral icterus.  Neck: Normal range of motion. Neck supple. No JVD present. No thyromegaly present.  Cardiovascular: Normal rate, regular rhythm, normal heart sounds and intact distal pulses. Exam reveals no gallop and no friction rub.  No murmur heard. Pulmonary/Chest: He is in respiratory distress. He has wheezes. He has no rales.  There is wheezing in all lung fields, there is prolonged expiratory phase, he speaks in short and sentences, his oxygen is 100% on supplemental oxygen  Abdominal: Soft. Bowel sounds are normal. He exhibits no distension and no mass. There is no tenderness.  Musculoskeletal: Normal range of motion. He exhibits no edema or tenderness.  Lymphadenopathy:    He has no cervical adenopathy.  Neurological: He is alert. Coordination  normal.  Skin: Skin is warm and dry. No rash noted. No erythema.  Psychiatric: He has a normal mood and affect. His behavior is normal.  Nursing note and vitals reviewed.    ED Treatments / Results  Labs (all labs ordered are listed, but only abnormal results are displayed) Labs Reviewed  CBC WITH DIFFERENTIAL/PLATELET - Abnormal; Notable for the following components:      Result Value   Hemoglobin 11.5 (*)    HCT 37.7 (*)    All other components within normal limits  COMPREHENSIVE METABOLIC PANEL - Abnormal; Notable for the following components:   Sodium 133 (*)    Chloride 97 (*)    Glucose, Bld 168 (*)    All other components within normal  limits  CULTURE, BLOOD (ROUTINE X 2)  CULTURE, BLOOD (ROUTINE X 2)  BRAIN NATRIURETIC PEPTIDE  INFLUENZA PANEL BY PCR (TYPE A & B)  URINALYSIS, ROUTINE W REFLEX MICROSCOPIC  I-STAT CG4 LACTIC ACID, ED  CG4 I-STAT (LACTIC ACID)    EKG EKG Interpretation  Date/Time:  Thursday September 07 2018 16:44:52 EST Ventricular Rate:  115 PR Interval:    QRS Duration: 128 QT Interval:  313 QTC Calculation: 433 R Axis:   123 Text Interpretation:  Sinus tachycardia Ventricular premature complex Right bundle branch block Since last tracing rate faster Confirmed by Eber HongMiller, Amyjo Mizrachi ([id number]) on 09/07/2018 4:53:37 PM   Radiology Dg Chest 2 View  Result Date: 09/07/2018 CLINICAL DATA:  Cough and shortness of breath for 2 days. EXAM: CHEST - 2 VIEW COMPARISON:  10/15/2017 FINDINGS: The cardiac silhouette, mediastinal and hilar contours are within normal limits and stable. Stable emphysematous changes and pulmonary scarring. No definite acute overlying pulmonary process. The bony thorax is intact. IMPRESSION: Chronic emphysematous changes and pulmonary scarring but no acute overlying pulmonary findings. Electronically Signed   By: Rudie MeyerP.  Gallerani M.D.   On: 09/07/2018 19:07    Procedures .Critical Care Performed by: Eber HongMiller, Isatu Macinnes, MD Authorized by:  Eber HongMiller, Langston Tuberville, MD   Critical care provider statement:    Critical care time (minutes):  35   Critical care time was exclusive of:  Separately billable procedures and treating other patients and teaching time   Critical care was necessary to treat or prevent imminent or life-threatening deterioration of the following conditions:  Respiratory failure   Critical care was time spent personally by me on the following activities:  Blood draw for specimens, development of treatment plan with patient or surrogate, discussions with consultants, evaluation of patient's response to treatment, examination of patient, obtaining history from patient or surrogate, ordering and performing treatments and interventions, ordering and review of laboratory studies, ordering and review of radiographic studies, pulse oximetry, re-evaluation of patient's condition and review of old charts   (including critical care time)  Medications Ordered in ED Medications  ipratropium-albuterol (DUONEB) 0.5-2.5 (3) MG/3ML nebulizer solution (  Not Given 09/07/18 1654)  cefTRIAXone (ROCEPHIN) 2 g in sodium chloride 0.9 % 100 mL IVPB (2 g Intravenous New Bag/Given 09/07/18 1816)  azithromycin (ZITHROMAX) 500 mg in sodium chloride 0.9 % 250 mL IVPB (500 mg Intravenous New Bag/Given 09/07/18 1909)  albuterol (PROVENTIL) (2.5 MG/3ML) 0.083% nebulizer solution 10 mg (10 mg Nebulization Given 09/07/18 1655)  albuterol (PROVENTIL, VENTOLIN) (5 MG/ML) 0.5% continuous inhalation solution (  Given 09/07/18 1654)  methylPREDNISolone sodium succinate (SOLU-MEDROL) 125 mg/2 mL injection 125 mg (125 mg Intravenous Given 09/07/18 1748)  ipratropium-albuterol (DUONEB) 0.5-2.5 (3) MG/3ML nebulizer solution 3 mL (3 mLs Nebulization Given 09/07/18 1655)  acetaminophen (TYLENOL) tablet 1,000 mg (1,000 mg Oral Given 09/07/18 1749)     Initial Impression / Assessment and Plan / ED Course  I have reviewed the triage vital signs and the nursing  notes.  Pertinent labs & imaging results that were available during my care of the patient were reviewed by me and considered in my medical decision making (see chart for details).  Clinical Course as of Sep 07 2006  Thu Sep 07, 2018  [id number] Initial lactic acid is 1.68, no leukocytosis.   [BM]  1919 Chest x-ray shows no signs of infiltrate, the patient has mild hyponatremia, he is persistently tachycardic, borderline febrile and continuing to be dyspneic.  Nebulizer therapy has improved somewhat but likely will need  to be admitted for COPD.   [BM]    Clinical Course User Index [BM] Eber Hong, MD    The patient is warm to the touch, he has had increasing shortness of breath with cough suggestive of an underlying COPD exacerbation which is potentially been brought on by infection.  Viral versus bacterial, check for flu, pneumonia, lab work, no edema or signs of fluid overload.  Continuous albuterol treatment has been initiated as well as Solu-Medrol.  The patient cannot recall the last time he was admitted or treated with steroids but thinks it was longer than 1 year.  Review of the medical record shows that he was actually admitted in January 2019 for a COPD exacerbation.  Multiple nebs given, continuous nebulizer therapy for 1 hour with minimal improvement, the patient will need to be admitted to the hospital.  Discussed with the hospitalist at 8:05 PM, they are willing to admit.  Final Clinical Impressions(s) / ED Diagnoses   Final diagnoses:  Respiratory distress  COPD exacerbation [facility])    ED Discharge Orders    None       Eber Hong, MD 09/07/18 2007

## 2018-09-07 NOTE — H&P (Addendum)
History and Physical    Jeff Ray:[id number] DOB: 1955-12-31 DOA: 09/07/2018  PCP: Gareth Morgan, MD Patient coming from: Home  Chief Complaint: SOB  HPI: Jeff Ray is a 62 y.o. male with medical history significant of asthma, COPD, on 2-3 L home O2,  DM2, anxiety, HTN presenting to the hospital for evaluation of SOB.  Patient is presenting with a 5-day history of dyspnea, wheezing, cough productive of yellow/green colored sputum.  Symptoms have been getting progressively worse.  He is not sure if he has been having any fevers or chills.  Denies having any chest pain.  States he chronically uses 2 to 3 L supplemental oxygen at home at all times.  He has been using his COPD inhalers but they have not been helping.  Also reports having fatigue, sneezing, and rhinorrhea for the past 5 days.  ED Course: Temperature 100.3 F, Tachycardic, tachypnic, on 2L supplemental O2 (same as home requirement). No leukocytosis.  BNP normal.  Lactic acid normal.  Flu panel negative.  Chest x-ray showing chronic emphysematous changes and pulmonary scarring but no acute overlying pulmonary findings.  UA and blood culture x2 pending. Received albuterol continuous neb treatment, duonebs, solumedrol 125 mg, ceftriaxone, and azithromycin in the ED.   Review of Systems: As per HPI otherwise 10 point review of systems negative.  Past Medical History:  Diagnosis Date  . Anxiety   . Asthma   . COPD (chronic obstructive pulmonary disease) (HCC)   . Diabetes mellitus   . History of nuclear stress test 02/2016   low risk study  . Hypertension   . On home O2     Past Surgical History:  Procedure Laterality Date  . BACK SURGERY       reports that he quit smoking about 5 years ago. His smoking use included cigarettes. He smoked 0.00 packs per day for 10.00 years. He has never used smokeless tobacco. He reports that he does not drink alcohol or use drugs.  No Known Allergies  Family History  Problem  Relation Age of Onset  . Diabetes Mother     Prior to Admission medications   Medication Sig Start Date End Date Taking? Authorizing Provider  albuterol (PROAIR HFA) 108 (90 BASE) MCG/ACT inhaler Inhale 2 puffs into the lungs every 6 (six) hours as needed for wheezing or shortness of breath. Shortness of Breath   Yes [provider]  alfuzosin (UROXATRAL) 10 MG 24 hr tablet Take 10 mg by mouth daily with breakfast.   Yes [provider]  ALPRAZolam (XANAX) 1 MG tablet Take 1 mg by mouth 3 (three) times daily as needed for anxiety. 09/30/17  Yes [provider]  Ernestina Patches 62.5-25 MCG/INH AEPB Take 1 puff by mouth daily. 08/31/17  Yes [provider]  fexofenadine-pseudoephedrine (ALLEGRA-D 24) 180-240 MG 24 hr tablet Take 1 tablet by mouth daily.   Yes [provider]  finasteride (PROSCAR) 5 MG tablet Take 5 mg by mouth daily.   Yes [provider]  fluticasone (FLONASE) 50 MCG/ACT nasal spray Place 2 sprays into both nostrils daily. 09/30/17  Yes [provider]  glimepiride (AMARYL) 2 MG tablet Take 2 mg by mouth daily with breakfast.   Yes [provider]  Guaifenesin (MUCINEX MAXIMUM STRENGTH) 1200 MG TB12 Take 1 tablet by mouth 2 (two) times daily.    Yes [provider]  ibuprofen (ADVIL,MOTRIN) 800 MG tablet Take 800 mg by mouth every 8 (eight) hours as needed  for mild pain or moderate pain.   Yes [provider]  ipratropium-albuterol (DUONEB) 0.5-2.5 (3) MG/3ML SOLN Take 3 mLs by nebulization every 4 (four) hours as needed. For shortness of breath   Yes [provider]  metFORMIN (GLUCOPHAGE-XR) 500 MG 24 hr tablet Take 1,000 mg by mouth 2 (two) times daily.    Yes [provider]  montelukast (SINGULAIR) 10 MG tablet Take 10 mg by mouth every morning.    Yes [provider]  pantoprazole (PROTONIX) 40 MG tablet Take 40 mg by mouth every morning.    Yes [provider]  rosuvastatin (CRESTOR) 40 MG tablet Take 40 mg by mouth at bedtime.  07/24/17  Yes [provider]  levofloxacin (LEVAQUIN) 500 MG tablet Take 500 mg by mouth daily. 10 day course starting on 08/16/2018 08/16/18   [provider]    Physical Exam: Vitals:   09/07/18 1715 09/07/18 1730 09/07/18 1745 09/07/18 1800  BP:  (!) 147/77  121/72  Pulse: (!) 111 (!) 115 (!) 114 (!) 112  Resp: (!) 30 18 15  (!) 21  Temp:      TempSrc:      SpO2: 100% 100% 100% 97%  Weight:      Height:        Physical Exam  Constitutional: He is oriented to person, place, and time. He appears well-developed and well-nourished. No distress.  HENT:  Head: Normocephalic.  Mouth/Throat: Oropharynx is clear and moist.  Eyes: Right eye exhibits no discharge. Left eye exhibits no discharge.  Neck: Neck supple.  Cardiovascular: Regular rhythm and intact distal pulses.  Tachycardic  Pulmonary/Chest: Effort normal. He has no rales.  Minimal scattered end expiratory wheezing Speaking clearly in full sentences  Abdominal: Soft. Bowel sounds are normal. He exhibits no distension. There is no tenderness. There is no guarding.  Musculoskeletal: He exhibits no edema.  Neurological: He is alert and oriented to person, place, and time.  Skin: Skin is warm and dry. He is not diaphoretic.     Labs on Admission: I have personally reviewed following labs and imaging studies  CBC: Recent Labs  Lab 09/07/18 1819  WBC 7.4  NEUTROABS 4.4  HGB 11.5*  HCT 37.7*  MCV 87.7  PLT 229   Basic Metabolic Panel: Recent Labs  Lab 09/07/18 1819  NA 133*  K 3.8  CL 97*  CO2 27  GLUCOSE 168*  BUN 10  CREATININE 1.03  CALCIUM 9.1   GFR: Estimated Creatinine Clearance: 85.3 mL/min (by C-G formula based on SCr of 1.03 mg/dL). Liver Function Tests: Recent Labs  Lab 09/07/18 1819  AST 20  ALT 20  ALKPHOS 78  BILITOT 0.5  PROT 7.8  ALBUMIN 3.9   No results for input(s): LIPASE,  AMYLASE in the last 168 hours. No results for input(s): AMMONIA in the last 168 hours. Coagulation Profile: No results for input(s): INR, PROTIME in the last 168 hours. Cardiac Enzymes: No results for input(s): CKTOTAL, CKMB, CKMBINDEX, TROPONINI in the last 168 hours. BNP (last 3 results) No results for input(s): PROBNP in the last 8760 hours. HbA1C: No results for input(s): HGBA1C in the last 72 hours. CBG: No results for input(s): GLUCAP in the last 168 hours. Lipid Profile: No results for input(s): CHOL, HDL, LDLCALC, TRIG, CHOLHDL, LDLDIRECT in the last 72 hours. Thyroid Function Tests: No results for input(s): TSH, T4TOTAL, FREET4, T3FREE, THYROIDAB in the last 72 hours. Anemia Panel: No results for input(s): VITAMINB12, FOLATE, FERRITIN, TIBC,  IRON, RETICCTPCT in the last 72 hours. Urine analysis:    Component Value Date/Time   COLORURINE YELLOW 09/07/2018 1659   APPEARANCEUR CLEAR 09/07/2018 1659   LABSPEC 1.019 09/07/2018 1659   PHURINE 6.0 09/07/2018 1659   GLUCOSEU NEGATIVE 09/07/2018 1659   HGBUR NEGATIVE 09/07/2018 1659   BILIRUBINUR NEGATIVE 09/07/2018 1659   KETONESUR NEGATIVE 09/07/2018 1659   PROTEINUR NEGATIVE 09/07/2018 1659   UROBILINOGEN 0.2 02/08/2008 1731   NITRITE NEGATIVE 09/07/2018 1659   LEUKOCYTESUR NEGATIVE 09/07/2018 1659    Radiological Exams on Admission: Dg Chest 2 View  Result Date: 09/07/2018 CLINICAL DATA:  Cough and shortness of breath for 2 days. EXAM: CHEST - 2 VIEW COMPARISON:  10/15/2017 FINDINGS: The cardiac silhouette, mediastinal and hilar contours are within normal limits and stable. Stable emphysematous changes and pulmonary scarring. No definite acute overlying pulmonary process. The bony thorax is intact. IMPRESSION: Chronic emphysematous changes and pulmonary scarring but no acute overlying pulmonary findings. Electronically Signed   By: Rudie Meyer M.D.   On: 09/07/2018 19:07    EKG: Independently reviewed.  Sinus  tachycardia (heart rate 115).  Right bundle branch block, similar to prior tracing.  Assessment/Plan Principal Problem:   COPD exacerbation (HCC) Active Problems:   HTN (hypertension)   DM (diabetes mellitus) (HCC)   Anemia   SIRS (systemic inflammatory response syndrome) (HCC)   Asthma   HLD (hyperlipidemia)   GERD (gastroesophageal reflux disease)   Anxiety   BPH (benign prostatic hyperplasia)   Acute exacerbation of COPD, asthma, SIRS -Likely in the setting of viral URI.  Presenting with a 5-day history of dyspnea, wheezing, cough productive of yellow/green-colored sputum in addition to viral URI symptoms. -Temperature 100.3 F, tachycardic, tachypnic, on 2L supplemental O2 (same as home requirement). No leukocytosis. Lactic acid normal.  Flu panel negative.  BNP normal.  Chest x-ray showing chronic emphysematous changes and pulmonary scarring but no acute overlying pulmonary findings.  EKG not suggestive of ACS; patient denies having any chest pain.  Received albuterol continuous neb treatment, duonebs, solumedrol 125 mg, ceftriaxone, and azithromycin in the ED.  -Continue azithromycin -Levalbuterol-ipratropium every 6 hours -Prednisone 40 mg daily -Mucinex DM for cough -Flonase nasal spray -Claritin -Supplemental oxygen -Check troponin level -UA pending -Blood culture x2 pending  Addendum: Troponin 0.08 likely due to demand ischemia. Patient continues to be chest pain free. Troponin has previously been borderline elevated as well. Will continue to trend.   Chronic anemia -Hemoglobin 11.5, stable.  Type 2 diabetes -Check A1c -Sliding scale insulin sensitive -CBG checks -Hold home glimepiride, metformin  Hypertension Currently normotensive. Not on any home medications. -Continue to monitor  Hyperlipidemia -Continue home Crestor  GERD -Continue PPI  Anxiety -Continue home Xanax as needed  BPH -Continue home alfuzosin, finasteride  DVT prophylaxis:  Lovenox Code Status: Patient wishes to be full code. Family Communication: No family available Disposition Plan: Anticipate discharge to home in 1 to 2 days.   Consults called: None Admission status: Observation   John Giovanni MD [facility] Pager [phone]  If 7PM-7AM, please contact night-coverage [email or web address] Password [facility]  09/07/2018, 8:37 PM

## 2018-09-07 NOTE — ED Triage Notes (Signed)
Patient has been on Levaquin twice in the past 2 months.

## 2018-09-07 NOTE — ED Triage Notes (Signed)
Patient reports increasing SOB for the past 3 days. Patient reports thick greenish sputum. Febrile in Triage.

## 2018-09-07 NOTE — Progress Notes (Signed)
CRITICAL VALUE ALERT  Critical Value:  Troponin 0.08  Date & Time Notied:  09/07/18 2115  Provider Notified: Loney Lohrathore md  Orders Received/Actions taken: awaiting

## 2018-09-07 NOTE — Progress Notes (Signed)
Patient was source blood for a needle stick exposure, appropriate paperwork done and labs drawn.

## 2018-09-07 NOTE — ED Notes (Signed)
Respiratory notified.

## 2018-09-07 NOTE — ED Triage Notes (Signed)
Patient recently finished antibiotics.

## 2018-09-08 ENCOUNTER — Other Ambulatory Visit: Payer: Self-pay

## 2018-09-08 DIAGNOSIS — J9611 Chronic respiratory failure with hypoxia: Secondary | ICD-10-CM | POA: Diagnosis not present

## 2018-09-08 DIAGNOSIS — E1165 Type 2 diabetes mellitus with hyperglycemia: Secondary | ICD-10-CM | POA: Diagnosis not present

## 2018-09-08 DIAGNOSIS — J441 Chronic obstructive pulmonary disease with (acute) exacerbation: Secondary | ICD-10-CM | POA: Diagnosis not present

## 2018-09-08 DIAGNOSIS — R7989 Other specified abnormal findings of blood chemistry: Secondary | ICD-10-CM | POA: Diagnosis not present

## 2018-09-08 DIAGNOSIS — R778 Other specified abnormalities of plasma proteins: Secondary | ICD-10-CM

## 2018-09-08 DIAGNOSIS — E782 Mixed hyperlipidemia: Secondary | ICD-10-CM | POA: Diagnosis not present

## 2018-09-08 LAB — RESPIRATORY PANEL BY PCR
Adenovirus: NOT DETECTED
Bordetella pertussis: NOT DETECTED
Chlamydophila pneumoniae: NOT DETECTED
Coronavirus 229E: NOT DETECTED
Coronavirus HKU1: NOT DETECTED
Coronavirus NL63: NOT DETECTED
Coronavirus OC43: NOT DETECTED
INFLUENZA A-RVPPCR: NOT DETECTED
Influenza B: NOT DETECTED
Metapneumovirus: NOT DETECTED
Mycoplasma pneumoniae: NOT DETECTED
Parainfluenza Virus 1: NOT DETECTED
Parainfluenza Virus 2: NOT DETECTED
Parainfluenza Virus 3: NOT DETECTED
Parainfluenza Virus 4: NOT DETECTED
Respiratory Syncytial Virus: NOT DETECTED
Rhinovirus / Enterovirus: DETECTED — AB

## 2018-09-08 LAB — TROPONIN I
Troponin I: 0.08 ng/mL (ref <0.03)
Troponin I: 0.07 ng/mL (ref <0.03)

## 2018-09-08 LAB — GLUCOSE, CAPILLARY
Glucose-Capillary: 210 mg/dL — ABNORMAL HIGH (ref 70–99)
Glucose-Capillary: 314 mg/dL — ABNORMAL HIGH (ref 70–99)
Glucose-Capillary: 248 mg/dL — ABNORMAL HIGH (ref 70–99)
Glucose-Capillary: 305 mg/dL — ABNORMAL HIGH (ref 70–99)

## 2018-09-08 LAB — HEMOGLOBIN A1C
Hgb A1c MFr Bld: 7.5 % — ABNORMAL HIGH (ref 4.8–5.6)
Mean Plasma Glucose: 168.55 mg/dL

## 2018-09-08 MED ORDER — ONDANSETRON HCL 4 MG/2ML IJ SOLN: 4.0000 mg | Freq: Four times a day (QID) | INTRAMUSCULAR | Status: DC | PRN

## 2018-09-08 MED ORDER — METHYLPREDNISOLONE SODIUM SUCC 125 MG IJ SOLR
60.0000 mg | Freq: Four times a day (QID) | INTRAMUSCULAR | Status: DC
  Administered 2018-09-08 – 2018-09-09 (×5): 60 mg via INTRAVENOUS
  Filled 2018-09-08 (×5): qty 2

## 2018-09-08 MED ORDER — DM-GUAIFENESIN ER 30-600 MG PO TB12: 1.0000 | ORAL_TABLET | Freq: Two times a day (BID) | ORAL | Status: DC

## 2018-09-08 MED ORDER — INSULIN ASPART 100 UNIT/ML ~~LOC~~ SOLN
0.0000 [IU] | Freq: Three times a day (TID) | SUBCUTANEOUS | Status: DC
  Administered 2018-09-08: 5 [IU] via SUBCUTANEOUS
  Administered 2018-09-08 – 2018-09-09 (×3): 11 [IU] via SUBCUTANEOUS

## 2018-09-08 MED ORDER — BUDESONIDE 0.5 MG/2ML IN SUSP
0.5000 mg | Freq: Two times a day (BID) | RESPIRATORY_TRACT | Status: DC
  Administered 2018-09-08 – 2018-09-10 (×5): 0.5 mg via RESPIRATORY_TRACT
  Filled 2018-09-08 (×5): qty 2

## 2018-09-08 MED ORDER — GUAIFENESIN ER 600 MG PO TB12
1200.0000 mg | ORAL_TABLET | Freq: Two times a day (BID) | ORAL | Status: DC
  Administered 2018-09-09 – 2018-09-10 (×3): 1200 mg via ORAL
  Filled 2018-09-08 (×3): qty 2

## 2018-09-08 MED ORDER — ACETAMINOPHEN 325 MG PO TABS: 650.0000 mg | ORAL_TABLET | Freq: Four times a day (QID) | ORAL | Status: DC | PRN

## 2018-09-08 MED ORDER — INSULIN ASPART 100 UNIT/ML ~~LOC~~ SOLN
0.0000 [IU] | Freq: Every day | SUBCUTANEOUS | Status: DC
  Administered 2018-09-08: 4 [IU] via SUBCUTANEOUS

## 2018-09-08 NOTE — Progress Notes (Signed)
PROGRESS NOTE  Jeff ImusKarl E Ray WGN:[id number] DOB: 01/10/1956 DOA: 09/07/2018 PCP: [name], [name], MD  Brief History:  62 year old male with a history of COPD, diabetes mellitus, hypertension, chronic respiratory failure on 2L presenting with 4 to 5-day history of increasing shortness of breath, coughing, and green sputum production.  The patient states that in the past 2 months, he has been prescribed a week course of levofloxacin for bronchitis type symptoms on 07/13/2018 and 08/16/2018 by his primary care provider.  He experiences some temporary improvement, but continues to worsen again once he is finished.  He states that his wife and grandson have had some upper respiratory type infections in the recent past.  They continue to smoke in the house.  He denies any fevers, chills, chest pain, nausea, vomiting, diarrhea, abdominal pain, dysuria, hematuria.  He denies any hemoptysis.  He quit smoking 5 years ago after approximately 40 pack-year history.  Upon presentation, the patient had a low-grade temperature 100.3 F.  WBC was 7.4.  BMP and LFTs were essentially unremarkable.  Assessment/Plan: Acute COPD exacerbation -Start Pulmicort -Start IV Solu-Medrol -Continue Xopenex and Atrovent -Personally reviewed chest x-ray--no infiltrates; chronic vestigial markings -Viral respiratory panel -Influenza PCR negative  Chronic respiratory failure with hypoxia -Normally on 2 L, 3 L with activity  Elevated troponin -Secondary to demand ischemia -Trend is flat -No chest pain presently -Personally reviewed EKG--sinus rhythm, unchanged right bundle branch block  Diabetes mellitus type 2 -Holding metformin and Amaryl -Hemoglobin A1c -NovoLog sliding scale  Hyperlipidemia -Continue Crestor  Anxiety -Continue home dose of problem  GERD -Continue Protonix   Disposition Plan:   Home in 2-3 days  Family Communication:  No Family at bedside  Consultants:  none  Code Status:   FULL  DVT Prophylaxis:   [place] Lovenox   Procedures: As Listed in Progress Note Above  Antibiotics: Azithromycin 12/5>>>          Subjective: The patient states that he is breathing better than yesterday but still has some dyspnea on exertion.  He continues to have mostly nonproductive cough.  He denies any hemoptysis.  There is no nausea, vomiting, diarrhea, chest pain, fevers, chills.  There is no dysuria hematuria.  Objective: Vitals:   09/07/18 2119 09/08/18 0143 09/08/18 0550 09/08/18 0745  BP:   139/75   Pulse:   (!) 110   Resp:   18   Temp:   98.5 F (36.9 C)   TempSrc:   Oral   SpO2: 99% 98% 99% 95%  Weight:      Height:        Intake/Output Summary (Last 24 hours) at 09/08/2018 0849 Last data filed at 09/08/2018 0551 Gross per 24 hour  Intake -  Output 900 ml  Net -900 ml   Weight change:  Exam:   General:  Pt is alert, follows commands appropriately, not in acute distress  HEENT: No icterus, No thrush, No neck mass, [place]/AT  Cardiovascular: RRR, S1/S2, no rubs, no gallops  Respiratory: Bilateral rales.  Bilateral wheezing.  Good air movement.  Abdomen: Soft/+BS, non tender, non distended, no guarding  Extremities: trace LE edema, No lymphangitis, No petechiae, No rashes, no synovitis   Data Reviewed: I have personally reviewed following labs and imaging studies Basic Metabolic Panel: Recent Labs  Lab 09/07/18 1819  NA 133*  K 3.8  CL 97*  CO2 27  GLUCOSE 168*  BUN 10  CREATININE 1.03  CALCIUM 9.1  Liver Function Tests: Recent Labs  Lab 09/07/18 1819  AST 20  ALT 20  ALKPHOS 78  BILITOT 0.5  PROT 7.8  ALBUMIN 3.9   No results for input(s): LIPASE, AMYLASE in the last 168 hours. No results for input(s): AMMONIA in the last 168 hours. Coagulation Profile: No results for input(s): INR, PROTIME in the last 168 hours. CBC: Recent Labs  Lab 09/07/18 1819  WBC 7.4  NEUTROABS 4.4  HGB 11.5*  HCT 37.7*  MCV 87.7  PLT 229    Cardiac Enzymes: Recent Labs  Lab 09/07/18 1819 09/07/18 2147 09/08/18 0332  TROPONINI 0.08* 0.08* 0.08*   BNP: Invalid input(s): POCBNP CBG: Recent Labs  Lab 09/07/18 2105 09/08/18 0733  GLUCAP 218* 210*   HbA1C: No results for input(s): HGBA1C in the last 72 hours. Urine analysis:    Component Value Date/Time   COLORURINE YELLOW 09/07/2018 1659   APPEARANCEUR CLEAR 09/07/2018 1659   LABSPEC 1.019 09/07/2018 1659   PHURINE 6.0 09/07/2018 1659   GLUCOSEU NEGATIVE 09/07/2018 1659   HGBUR NEGATIVE 09/07/2018 1659   BILIRUBINUR NEGATIVE 09/07/2018 1659   KETONESUR NEGATIVE 09/07/2018 1659   PROTEINUR NEGATIVE 09/07/2018 1659   UROBILINOGEN 0.2 02/08/2008 1731   NITRITE NEGATIVE 09/07/2018 1659   LEUKOCYTESUR NEGATIVE 09/07/2018 1659   Sepsis Labs: @LABRCNTIP (procalcitonin:4,lacticidven:4) ) Recent Results (from the past 240 hour(s))  Blood Culture (routine x 2)     Status: None (Preliminary result)   Collection Time: 09/07/18  6:19 PM  Result Value Ref Range Status   Specimen Description BLOOD RIGHT ANTECUBITAL  Final   Special Requests   Final    BOTTLES DRAWN AEROBIC AND ANAEROBIC Blood Culture results may not be optimal due to an excessive volume of blood received in culture bottles   Culture   Final    NO GROWTH < 24 HOURS Performed at [facility], [address] [place], [place] [address]    Report Status PENDING  Incomplete  Blood Culture (routine x 2)     Status: None (Preliminary result)   Collection Time: 09/07/18  6:20 PM  Result Value Ref Range Status   Specimen Description BLOOD LEFT HAND  Final   Special Requests   Final    BOTTLES DRAWN AEROBIC AND ANAEROBIC Blood Culture adequate volume   Culture   Final    NO GROWTH < 24 HOURS Performed at [facility], [address], [place], [place] [address]    Report Status PENDING  Incomplete     Scheduled Meds: . alfuzosin  10 mg Oral Q breakfast  . azithromycin  250 mg Oral Daily  . enoxaparin  (LOVENOX) injection  40 mg Subcutaneous Q24H  . finasteride  5 mg Oral Daily  . fluticasone  2 spray Each Nare Daily  . guaiFENesin  600 mg Oral BID  . insulin aspart  0-9 Units Subcutaneous TID WC  . ipratropium  0.5 mg Nebulization Q6H  . levalbuterol  0.63 mg Nebulization Q6H  . loratadine  10 mg Oral Daily  . montelukast  10 mg Oral q morning - 10a  . pantoprazole  40 mg Oral q morning - 10a  . predniSONE  40 mg Oral Q breakfast  . rosuvastatin  40 mg Oral QHS   Continuous Infusions:  Procedures/Studies: Dg Chest 2 View  Result Date: 09/07/2018 CLINICAL DATA:  Cough and shortness of breath for 2 days. EXAM: CHEST - 2 VIEW COMPARISON:  10/15/2017 FINDINGS: The cardiac silhouette, mediastinal and hilar contours are within normal  limits and stable. Stable emphysematous changes and pulmonary scarring. No definite acute overlying pulmonary process. The bony thorax is intact. IMPRESSION: Chronic emphysematous changes and pulmonary scarring but no acute overlying pulmonary findings. Electronically Signed   By: Rudie Meyer M.D.   On: 09/07/2018 19:07    Catarina Hartshorn, DO  [facility] Pager [phone]  If 7PM-7AM, please contact night-coverage [email or web address] Password [email or web address] 09/08/2018, 8:49 AM   LOS: 0 days

## 2018-09-08 NOTE — Care Management Note (Signed)
Case Management Note  Patient Details  Name: Jeff Ray MRN: [id number] Date of Birth: 10/23/1955  Subjective/Objective:      Admitted with COPD. Pt from home, ind with most ADL's. Pt uses electric WC. Has home oxygen and neb machine pta. No HH services pta. This is pt's only admission or ED visit in 6 months. Pt communicates no needs or concerns. Low readmission risk score.              Action/Plan: DC home with self care. No needs anticipated.   Expected Discharge Date:    09/10/18              Expected Discharge Plan:  Home/Self Care  In-House Referral:  NA  Discharge planning Services  CM Consult  Post Acute Care Choice:  NA Choice offered to:  NA  Status of Service:  Completed, signed off  Malcolm MetroChildress, Kritika Stukes Demske, RN 09/08/2018, 11:34 AM

## 2018-09-08 NOTE — Care Management Obs Status (Signed)
MEDICARE OBSERVATION STATUS NOTIFICATION   Patient Details  Name: Jeff ImusKarl E Kerin MRN: [id number] Date of Birth: 12/20/1955   Medicare Observation Status Notification Given:  Yes    Malcolm MetroChildress, Neesa Knapik Demske, RN 09/08/2018, 11:33 AM

## 2018-09-09 DIAGNOSIS — Z87891 Personal history of nicotine dependence: Secondary | ICD-10-CM | POA: Diagnosis not present

## 2018-09-09 DIAGNOSIS — J441 Chronic obstructive pulmonary disease with (acute) exacerbation: Principal | ICD-10-CM

## 2018-09-09 DIAGNOSIS — T380X5A Adverse effect of glucocorticoids and synthetic analogues, initial encounter: Secondary | ICD-10-CM | POA: Diagnosis not present

## 2018-09-09 DIAGNOSIS — I10 Essential (primary) hypertension: Secondary | ICD-10-CM | POA: Diagnosis not present

## 2018-09-09 DIAGNOSIS — E785 Hyperlipidemia, unspecified: Secondary | ICD-10-CM | POA: Diagnosis not present

## 2018-09-09 DIAGNOSIS — Z9981 Dependence on supplemental oxygen: Secondary | ICD-10-CM | POA: Diagnosis not present

## 2018-09-09 DIAGNOSIS — Z7951 Long term (current) use of inhaled steroids: Secondary | ICD-10-CM | POA: Diagnosis not present

## 2018-09-09 DIAGNOSIS — E1165 Type 2 diabetes mellitus with hyperglycemia: Secondary | ICD-10-CM | POA: Diagnosis not present

## 2018-09-09 DIAGNOSIS — Z993 Dependence on wheelchair: Secondary | ICD-10-CM | POA: Diagnosis not present

## 2018-09-09 DIAGNOSIS — F419 Anxiety disorder, unspecified: Secondary | ICD-10-CM | POA: Diagnosis not present

## 2018-09-09 DIAGNOSIS — K219 Gastro-esophageal reflux disease without esophagitis: Secondary | ICD-10-CM | POA: Diagnosis not present

## 2018-09-09 DIAGNOSIS — I248 Other forms of acute ischemic heart disease: Secondary | ICD-10-CM | POA: Diagnosis not present

## 2018-09-09 DIAGNOSIS — E782 Mixed hyperlipidemia: Secondary | ICD-10-CM | POA: Diagnosis not present

## 2018-09-09 DIAGNOSIS — B9719 Other enterovirus as the cause of diseases classified elsewhere: Secondary | ICD-10-CM | POA: Diagnosis not present

## 2018-09-09 DIAGNOSIS — R6510 Systemic inflammatory response syndrome (SIRS) of non-infectious origin without acute organ dysfunction: Secondary | ICD-10-CM | POA: Diagnosis not present

## 2018-09-09 DIAGNOSIS — N400 Enlarged prostate without lower urinary tract symptoms: Secondary | ICD-10-CM | POA: Diagnosis not present

## 2018-09-09 DIAGNOSIS — Z79899 Other long term (current) drug therapy: Secondary | ICD-10-CM | POA: Diagnosis not present

## 2018-09-09 DIAGNOSIS — I493 Ventricular premature depolarization: Secondary | ICD-10-CM | POA: Diagnosis not present

## 2018-09-09 DIAGNOSIS — Z7984 Long term (current) use of oral hypoglycemic drugs: Secondary | ICD-10-CM | POA: Diagnosis not present

## 2018-09-09 DIAGNOSIS — R7989 Other specified abnormal findings of blood chemistry: Secondary | ICD-10-CM | POA: Diagnosis not present

## 2018-09-09 DIAGNOSIS — Z833 Family history of diabetes mellitus: Secondary | ICD-10-CM | POA: Diagnosis not present

## 2018-09-09 DIAGNOSIS — J9611 Chronic respiratory failure with hypoxia: Secondary | ICD-10-CM | POA: Diagnosis not present

## 2018-09-09 DIAGNOSIS — R0602 Shortness of breath: Secondary | ICD-10-CM | POA: Diagnosis present

## 2018-09-09 DIAGNOSIS — D649 Anemia, unspecified: Secondary | ICD-10-CM | POA: Diagnosis not present

## 2018-09-09 DIAGNOSIS — I4510 Unspecified right bundle-branch block: Secondary | ICD-10-CM | POA: Diagnosis not present

## 2018-09-09 LAB — GLUCOSE, CAPILLARY
Glucose-Capillary: 326 mg/dL — ABNORMAL HIGH (ref 70–99)
Glucose-Capillary: 303 mg/dL — ABNORMAL HIGH (ref 70–99)
Glucose-Capillary: 270 mg/dL — ABNORMAL HIGH (ref 70–99)
Glucose-Capillary: 341 mg/dL — ABNORMAL HIGH (ref 70–99)

## 2018-09-09 MED ORDER — PREDNISONE 20 MG PO TABS
60.0000 mg | ORAL_TABLET | Freq: Every day | ORAL | Status: DC
  Administered 2018-09-10: 60 mg via ORAL
  Filled 2018-09-09: qty 3

## 2018-09-09 MED ORDER — METHYLPREDNISOLONE SODIUM SUCC 125 MG IJ SOLR
60.0000 mg | Freq: Four times a day (QID) | INTRAMUSCULAR | Status: AC
  Administered 2018-09-09: 60 mg via INTRAVENOUS
  Filled 2018-09-09: qty 2

## 2018-09-09 MED ORDER — INSULIN ASPART 100 UNIT/ML ~~LOC~~ SOLN
0.0000 [IU] | Freq: Three times a day (TID) | SUBCUTANEOUS | Status: DC
  Administered 2018-09-09 – 2018-09-10 (×2): 11 [IU] via SUBCUTANEOUS

## 2018-09-09 MED ORDER — INSULIN GLARGINE 100 UNIT/ML ~~LOC~~ SOLN
10.0000 [IU] | Freq: Every day | SUBCUTANEOUS | Status: DC
  Administered 2018-09-09 – 2018-09-10 (×2): 10 [IU] via SUBCUTANEOUS
  Filled 2018-09-09 (×3): qty 0.1

## 2018-09-09 MED ORDER — INSULIN ASPART 100 UNIT/ML ~~LOC~~ SOLN
0.0000 [IU] | Freq: Every day | SUBCUTANEOUS | Status: DC
  Administered 2018-09-09: 4 [IU] via SUBCUTANEOUS

## 2018-09-09 NOTE — Discharge Summary (Addendum)
Physician Discharge Summary  Jeff Ray UJW:[id number] DOB: May 27, 1956 DOA: 09/07/2018  PCP: Gareth Morgan, MD  Admit date: 09/07/2018 Discharge date: 09/10/2018  Admitted From:Home Disposition:  Home   Recommendations for Outpatient Follow-up:  1. Follow up with PCP in 1-2 weeks 2. Please obtain BMP/CBC in one week   Home Health: yes Equipment/Devices: oxygen 2-3 L  Discharge Condition: Stable CODE STATUS: FULL Diet recommendation: Heart Healthy / Carb Modified    Brief/Interim Summary: 62 year old male with a history of COPD, diabetes mellitus, hypertension, chronic respiratory failure on2Lpresenting with 4 to 5-day history of increasing shortness of breath, coughing, and green sputum production. The patient states that in the past 2 months, he has been prescribed a week course of levofloxacin for bronchitis type symptoms on 07/13/2018 and 08/16/2018 by his primary care provider. He experiences some temporary improvement, but continues to worsen again once he is finished. He states that his wife and grandson have had some upper respiratory type infections in the recent past. They continue to smoke in the house. He denies any fevers, chills, chest pain, nausea, vomiting, diarrhea, abdominal pain, dysuria, hematuria. He denies any hemoptysis. He quit smoking 5 years ago after approximately 40pack-year history. Upon presentation, the patient had a low-grade temperature 100.3 F. WBC was 7.4. BMP and LFTs were essentially unremarkable.  Pt was started on IV steroids, pulmicort and duonebs with clinical improvement. Viral respiratory panel showed rhino/enterovirus which is the likely trigger for his exacerbation.  Discharge Diagnoses:  Acute COPD exacerbation -Continue Pulmicort -Continue IV Solu-Medrol>>>d/c with prednisone taper -Continue Xopenex and Atrovent -Personally reviewed chest x-ray--no infiltrates;chronic interstitial markings -Viral respiratory  panel--+Rhinovirus/Enterovirus -Influenza PCR negative  Chronic respiratory failure with hypoxia -Normally on 2 L, 3 L with activity -stable on 2L during hospitalization  Elevated troponin -Secondary to demand ischemia -Trend is flat -No chest pain presently -Personally reviewed EKG--sinus rhythm,unchanged right bundle branch block  Diabetes mellitus type 2 -Holding metformin and Amaryl-->restart after d/c -12/6--Hemoglobin A1c--7.5 -NovoLog sliding scale -CBGs elevated due to steroids -added lantus 10 units daily during the hospitalization -increase to resistant ISS -anticipate improving CBGs with steroid wean  Hyperlipidemia -Continue Crestor  Anxiety -Continue home dose of problem  GERD -Continue Protonix   Discharge Instructions   Allergies as of 09/10/2018   No Known Allergies     Medication List    STOP taking these medications   levofloxacin 500 MG tablet Commonly known as:  LEVAQUIN     TAKE these medications   alfuzosin 10 MG 24 hr tablet Commonly known as:  UROXATRAL Take 10 mg by mouth daily with breakfast.   ALPRAZolam 1 MG tablet Commonly known as:  XANAX Take 1 mg by mouth 3 (three) times daily as needed for anxiety.   ANORO ELLIPTA 62.5-25 MCG/INH Aepb Generic drug:  umeclidinium-vilanterol Take 1 puff by mouth daily.   fexofenadine-pseudoephedrine 180-240 MG 24 hr tablet Commonly known as:  ALLEGRA-D 24 Take 1 tablet by mouth daily.   finasteride 5 MG tablet Commonly known as:  PROSCAR Take 5 mg by mouth daily.   fluticasone 50 MCG/ACT nasal spray Commonly known as:  FLONASE Place 2 sprays into both nostrils daily.   glimepiride 2 MG tablet Commonly known as:  AMARYL Take 2 mg by mouth daily with breakfast.   ibuprofen 800 MG tablet Commonly known as:  ADVIL,MOTRIN Take 800 mg by mouth every 8 (eight) hours as needed for mild pain or moderate pain.   ipratropium-albuterol 0.5-2.5 (3) MG/3ML Soln Commonly known  as:   DUONEB Take 3 mLs by nebulization every 4 (four) hours as needed. For shortness of breath   metFORMIN 500 MG 24 hr tablet Commonly known as:  GLUCOPHAGE-XR Take 1,000 mg by mouth 2 (two) times daily.   montelukast 10 MG tablet Commonly known as:  SINGULAIR Take 10 mg by mouth every morning.   MUCINEX MAXIMUM STRENGTH 1200 MG Tb12 Generic drug:  Guaifenesin Take 1 tablet by mouth 2 (two) times daily.   pantoprazole 40 MG tablet Commonly known as:  PROTONIX Take 40 mg by mouth every morning.   predniSONE 10 MG tablet Commonly known as:  DELTASONE Take 6 tablets (60 mg total) by mouth daily with breakfast. And decrease by one tablet every 2 days   PROAIR HFA 108 (90 Base) MCG/ACT inhaler Generic drug:  albuterol Inhale 2 puffs into the lungs every 6 (six) hours as needed for wheezing or shortness of breath. Shortness of Breath   rosuvastatin 40 MG tablet Commonly known as:  CRESTOR Take 40 mg by mouth at bedtime.      Follow-up Information    Kari Baars, MD Follow up in 1 month(s).   Specialty:  Pulmonary Disease Contact information: [address] [place] [address] [phone]          No Known Allergies  Consultations:  none   Procedures/Studies: Dg Chest 2 View  Result Date: 09/07/2018 CLINICAL DATA:  Cough and shortness of breath for 2 days. EXAM: CHEST - 2 VIEW COMPARISON:  10/15/2017 FINDINGS: The cardiac silhouette, mediastinal and hilar contours are within normal limits and stable. Stable emphysematous changes and pulmonary scarring. No definite acute overlying pulmonary process. The bony thorax is intact. IMPRESSION: Chronic emphysematous changes and pulmonary scarring but no acute overlying pulmonary findings. Electronically Signed   By: Rudie Meyer M.D.   On: 09/07/2018 19:07        Discharge Exam: Vitals:   09/10/18 0542 09/10/18 0756  BP: 140/80   Pulse: 87   Resp: 17   Temp: 97.6 F (36.4 C)   SpO2: 99% 95%   Vitals:     09/09/18 2154 09/10/18 0130 09/10/18 0542 09/10/18 0756  BP: (!) 143/74  140/80   Pulse: 98  87   Resp: 18  17   Temp: 97.7 F (36.5 C)  97.6 F (36.4 C)   TempSrc: Oral  Oral   SpO2: 99% 97% 99% 95%  Weight:      Height:        General: Pt is alert, awake, not in acute distress Cardiovascular: RRR, S1/S2 +, no rubs, no gallops Respiratory: diminished breath sounds bilateral.  No wheeze Abdominal: Soft, NT, ND, bowel sounds + Extremities: no edema, no cyanosis   The results of significant diagnostics from this hospitalization (including imaging, microbiology, ancillary and laboratory) are listed below for reference.    Significant Diagnostic Studies: Dg Chest 2 View  Result Date: 09/07/2018 CLINICAL DATA:  Cough and shortness of breath for 2 days. EXAM: CHEST - 2 VIEW COMPARISON:  10/15/2017 FINDINGS: The cardiac silhouette, mediastinal and hilar contours are within normal limits and stable. Stable emphysematous changes and pulmonary scarring. No definite acute overlying pulmonary process. The bony thorax is intact. IMPRESSION: Chronic emphysematous changes and pulmonary scarring but no acute overlying pulmonary findings. Electronically Signed   By: Rudie Meyer M.D.   On: 09/07/2018 19:07     Microbiology: Recent Results (from the past 240 hour(s))  Blood Culture (routine x 2)  Status: None (Preliminary result)   Collection Time: 09/07/18  6:19 PM  Result Value Ref Range Status   Specimen Description BLOOD RIGHT ANTECUBITAL  Final   Special Requests   Final    BOTTLES DRAWN AEROBIC AND ANAEROBIC Blood Culture results may not be optimal due to an excessive volume of blood received in culture bottles   Culture   Final    NO GROWTH 3 DAYS Performed at [facility], [address] [place], [place] [address]    Report Status PENDING  Incomplete  Blood Culture (routine x 2)     Status: None (Preliminary result)   Collection Time: 09/07/18  6:20 PM  Result Value Ref Range  Status   Specimen Description BLOOD LEFT HAND  Final   Special Requests   Final    BOTTLES DRAWN AEROBIC AND ANAEROBIC Blood Culture adequate volume   Culture   Final    NO GROWTH 3 DAYS Performed at [facility], [address] [place], [place] [address]    Report Status PENDING  Incomplete  Respiratory Panel by PCR     Status: Abnormal   Collection Time: 09/08/18  1:00 PM  Result Value Ref Range Status   Adenovirus NOT DETECTED NOT DETECTED Final   Coronavirus 229E NOT DETECTED NOT DETECTED Final   Coronavirus HKU1 NOT DETECTED NOT DETECTED Final   Coronavirus NL63 NOT DETECTED NOT DETECTED Final   Coronavirus OC43 NOT DETECTED NOT DETECTED Final   Metapneumovirus NOT DETECTED NOT DETECTED Final   Rhinovirus / Enterovirus DETECTED (A) NOT DETECTED Final   Influenza A NOT DETECTED NOT DETECTED Final   Influenza B NOT DETECTED NOT DETECTED Final   Parainfluenza Virus 1 NOT DETECTED NOT DETECTED Final   Parainfluenza Virus 2 NOT DETECTED NOT DETECTED Final   Parainfluenza Virus 3 NOT DETECTED NOT DETECTED Final   Parainfluenza Virus 4 NOT DETECTED NOT DETECTED Final   Respiratory Syncytial Virus NOT DETECTED NOT DETECTED Final   Bordetella pertussis NOT DETECTED NOT DETECTED Final   Chlamydophila pneumoniae NOT DETECTED NOT DETECTED Final   Mycoplasma pneumoniae NOT DETECTED NOT DETECTED Final    Comment: Performed at [facility], [address] [place], [place] [address]     Labs: Basic Metabolic Panel: Recent Labs  Lab 09/07/18 1819 09/10/18 0628  NA 133* 134*  K 3.8 4.0  CL 97* 97*  CO2 27 30  GLUCOSE 168* 319*  BUN 10 22  CREATININE 1.03 1.15  CALCIUM 9.1 9.2  MG  --  2.3   Liver Function Tests: Recent Labs  Lab 09/07/18 1819  AST 20  ALT 20  ALKPHOS 78  BILITOT 0.5  PROT 7.8  ALBUMIN 3.9   No results for input(s): LIPASE, AMYLASE in the last 168 hours. No results for input(s): AMMONIA in the last 168 hours. CBC: Recent Labs  Lab  09/07/18 1819  WBC 7.4  NEUTROABS 4.4  HGB 11.5*  HCT 37.7*  MCV 87.7  PLT 229   Cardiac Enzymes: Recent Labs  Lab 09/07/18 1819 09/07/18 2147 09/08/18 0332 09/08/18 1018  TROPONINI 0.08* 0.08* 0.08* 0.07*   BNP: Invalid input(s): POCBNP CBG: Recent Labs  Lab 09/09/18 0803 09/09/18 1152 09/09/18 1647 09/09/18 2053 09/10/18 0742  GLUCAP 326* 303* 270* 341* 268*    Time coordinating discharge:  36 minutes  Signed:  Catarina Hartshornavid Margorie Renner, DO [facility] Pager: [phone] 09/10/2018, 8:08 AM

## 2018-09-09 NOTE — Progress Notes (Signed)
PROGRESS NOTE  JENNY OMDAHL ZOX:[id number] DOB: [birth year] DOA: 09/07/2018 PCP: Gareth Morgan, MD  Brief History:  62 year old male with a history of COPD, diabetes mellitus, hypertension, chronic respiratory failure on 2L presenting with 4 to 5-day history of increasing shortness of breath, coughing, and green sputum production.  The patient states that in the past 2 months, he has been prescribed a week course of levofloxacin for bronchitis type symptoms on 07/13/2018 and 08/16/2018 by his primary care provider.  He experiences some temporary improvement, but continues to worsen again once he is finished.  He states that his wife and grandson have had some upper respiratory type infections in the recent past.  They continue to smoke in the house.  He denies any fevers, chills, chest pain, nausea, vomiting, diarrhea, abdominal pain, dysuria, hematuria.  He denies any hemoptysis.  He quit smoking 5 years ago after approximately 40 pack-year history.  Upon presentation, the patient had a low-grade temperature 100.3 F.  WBC was 7.4.  BMP and LFTs were essentially unremarkable.  Assessment/Plan: Acute COPD exacerbation -Continue Pulmicort -Continue IV Solu-Medrol -Continue Xopenex and Atrovent -Personally reviewed chest x-ray--no infiltrates; chronic vestigial markings -Viral respiratory panel--+Rhinovirus/Enterovirus -Influenza PCR negative  Chronic respiratory failure with hypoxia -Normally on 2 L, 3 L with activity  Elevated troponin -Secondary to demand ischemia -Trend is flat -No chest pain presently -Personally reviewed EKG--sinus rhythm, unchanged right bundle branch block  Diabetes mellitus type 2 -Holding metformin and Amaryl -12/6--Hemoglobin A1c--7.5 -NovoLog sliding scale -add lantus 10 units daily -increase to resistant ISS  Hyperlipidemia -Continue Crestor  Anxiety -Continue home dose of problem  GERD -Continue Protonix   Disposition Plan:    Home 12/8 if stable Family Communication:  No Family at bedside  Consultants:  none  Code Status:  FULL  DVT Prophylaxis:   Isabel Lovenox   Procedures: As Listed in Progress Note Above  Antibiotics: Azithromycin 12/5>>>     Subjective: Pt is breathing better.  Denies cp, n/v/d, abd pain.  No f/c headache.  Objective: Vitals:   09/09/18 0244 09/09/18 0609 09/09/18 1038 09/09/18 1406  BP:  134/79  (!) 147/82  Pulse:  (!) 101  (!) 108  Resp:  17  18  Temp:  (!) 97.5 F (36.4 C)  97.9 F (36.6 C)  TempSrc:  Oral  Oral  SpO2: 96% 97% 97% 99%  Weight:      Height:        Intake/Output Summary (Last 24 hours) at 09/09/2018 1434 Last data filed at 09/09/2018 0900 Gross per 24 hour  Intake 840 ml  Output 800 ml  Net 40 ml   Weight change:  Exam:   General:  Pt is alert, follows commands appropriately, not in acute distress  HEENT: No icterus, No thrush, No neck mass, [place]/AT  Cardiovascular: RRR, S1/S2, no rubs, no gallops  Respiratory: diminished breath sounds at bases.  Minimal basilar wheeze  Abdomen: Soft/+BS, non tender, non distended, no guarding  Extremities: No edema, No lymphangitis, No petechiae, No rashes, no synovitis   Data Reviewed: I have personally reviewed following labs and imaging studies Basic Metabolic Panel: Recent Labs  Lab 09/07/18 1819  NA 133*  K 3.8  CL 97*  CO2 27  GLUCOSE 168*  BUN 10  CREATININE 1.03  CALCIUM 9.1   Liver Function Tests: Recent Labs  Lab 09/07/18 1819  AST 20  ALT 20  ALKPHOS 78  BILITOT 0.5  PROT 7.8  ALBUMIN 3.9   No results for input(s): LIPASE, AMYLASE in the last 168 hours. No results for input(s): AMMONIA in the last 168 hours. Coagulation Profile: No results for input(s): INR, PROTIME in the last 168 hours. CBC: Recent Labs  Lab 09/07/18 1819  WBC 7.4  NEUTROABS 4.4  HGB 11.5*  HCT 37.7*  MCV 87.7  PLT 229   Cardiac Enzymes: Recent Labs  Lab 09/07/18 1819  09/07/18 2147 09/08/18 0332 09/08/18 1018  TROPONINI 0.08* 0.08* 0.08* 0.07*   BNP: Invalid input(s): POCBNP CBG: Recent Labs  Lab 09/08/18 1126 09/08/18 1635 09/08/18 2057 09/09/18 0803 09/09/18 1152  GLUCAP 314* 248* 305* 326* 303*   HbA1C: Recent Labs    09/08/18 0332  HGBA1C 7.5*   Urine analysis:    Component Value Date/Time   COLORURINE YELLOW 09/07/2018 1659   APPEARANCEUR CLEAR 09/07/2018 1659   LABSPEC 1.019 09/07/2018 1659   PHURINE 6.0 09/07/2018 1659   GLUCOSEU NEGATIVE 09/07/2018 1659   HGBUR NEGATIVE 09/07/2018 1659   BILIRUBINUR NEGATIVE 09/07/2018 1659   KETONESUR NEGATIVE 09/07/2018 1659   PROTEINUR NEGATIVE 09/07/2018 1659   UROBILINOGEN 0.2 02/08/2008 1731   NITRITE NEGATIVE 09/07/2018 1659   LEUKOCYTESUR NEGATIVE 09/07/2018 1659   Sepsis Labs: @LABRCNTIP (procalcitonin:4,lacticidven:4) ) Recent Results (from the past 240 hour(s))  Blood Culture (routine x 2)     Status: None (Preliminary result)   Collection Time: 09/07/18  6:19 PM  Result Value Ref Range Status   Specimen Description BLOOD RIGHT ANTECUBITAL  Final   Special Requests   Final    BOTTLES DRAWN AEROBIC AND ANAEROBIC Blood Culture results may not be optimal due to an excessive volume of blood received in culture bottles   Culture   Final    NO GROWTH 2 DAYS Performed at [facility], [address] [place], [place] [address]    Report Status PENDING  Incomplete  Blood Culture (routine x 2)     Status: None (Preliminary result)   Collection Time: 09/07/18  6:20 PM  Result Value Ref Range Status   Specimen Description BLOOD LEFT HAND  Final   Special Requests   Final    BOTTLES DRAWN AEROBIC AND ANAEROBIC Blood Culture adequate volume   Culture   Final    NO GROWTH 2 DAYS Performed at [facility], [address] [place], [place] [address]    Report Status PENDING  Incomplete  Respiratory Panel by PCR     Status: Abnormal   Collection Time: 09/08/18  1:00 PM  Result  Value Ref Range Status   Adenovirus NOT DETECTED NOT DETECTED Final   Coronavirus 229E NOT DETECTED NOT DETECTED Final   Coronavirus HKU1 NOT DETECTED NOT DETECTED Final   Coronavirus NL63 NOT DETECTED NOT DETECTED Final   Coronavirus OC43 NOT DETECTED NOT DETECTED Final   Metapneumovirus NOT DETECTED NOT DETECTED Final   Rhinovirus / Enterovirus DETECTED (A) NOT DETECTED Final   Influenza A NOT DETECTED NOT DETECTED Final   Influenza B NOT DETECTED NOT DETECTED Final   Parainfluenza Virus 1 NOT DETECTED NOT DETECTED Final   Parainfluenza Virus 2 NOT DETECTED NOT DETECTED Final   Parainfluenza Virus 3 NOT DETECTED NOT DETECTED Final   Parainfluenza Virus 4 NOT DETECTED NOT DETECTED Final   Respiratory Syncytial Virus NOT DETECTED NOT DETECTED Final   Bordetella pertussis NOT DETECTED NOT DETECTED Final   Chlamydophila pneumoniae NOT DETECTED NOT DETECTED Final   Mycoplasma pneumoniae NOT DETECTED NOT DETECTED Final  Comment: Performed at [facility], [address], [place], [place] [address]     Scheduled Meds: . alfuzosin  10 mg Oral Q breakfast  . azithromycin  250 mg Oral Daily  . budesonide (PULMICORT) nebulizer solution  0.5 mg Nebulization BID  . enoxaparin (LOVENOX) injection  40 mg Subcutaneous Q24H  . finasteride  5 mg Oral Daily  . fluticasone  2 spray Each Nare Daily  . guaiFENesin  1,200 mg Oral BID  . insulin aspart  0-20 Units Subcutaneous TID WC  . insulin aspart  0-5 Units Subcutaneous QHS  . insulin glargine  10 Units Subcutaneous Daily  . ipratropium  0.5 mg Nebulization Q6H  . levalbuterol  0.63 mg Nebulization Q6H  . loratadine  10 mg Oral Daily  . methylPREDNISolone (SOLU-MEDROL) injection  60 mg Intravenous Q6H  . montelukast  10 mg Oral q morning - 10a  . pantoprazole  40 mg Oral q morning - 10a  . [START ON 09/10/2018] predniSONE  60 mg Oral Q breakfast  . rosuvastatin  40 mg Oral QHS   Continuous Infusions:  Procedures/Studies: Dg Chest 2  View  Result Date: 09/07/2018 CLINICAL DATA:  Cough and shortness of breath for 2 days. EXAM: CHEST - 2 VIEW COMPARISON:  10/15/2017 FINDINGS: The cardiac silhouette, mediastinal and hilar contours are within normal limits and stable. Stable emphysematous changes and pulmonary scarring. No definite acute overlying pulmonary process. The bony thorax is intact. IMPRESSION: Chronic emphysematous changes and pulmonary scarring but no acute overlying pulmonary findings. Electronically Signed   By: Rudie Meyer M.D.   On: 09/07/2018 19:07    Catarina Hartshorn, DO  [facility] Pager [phone]  If 7PM-7AM, please contact night-coverage [email or web address] Password [email or web address] 09/09/2018, 2:34 PM   LOS: 0 days

## 2018-09-10 DIAGNOSIS — R7989 Other specified abnormal findings of blood chemistry: Secondary | ICD-10-CM | POA: Diagnosis not present

## 2018-09-10 DIAGNOSIS — R0602 Shortness of breath: Secondary | ICD-10-CM | POA: Diagnosis not present

## 2018-09-10 DIAGNOSIS — J441 Chronic obstructive pulmonary disease with (acute) exacerbation: Secondary | ICD-10-CM | POA: Diagnosis not present

## 2018-09-10 DIAGNOSIS — J9611 Chronic respiratory failure with hypoxia: Secondary | ICD-10-CM | POA: Diagnosis not present

## 2018-09-10 LAB — BASIC METABOLIC PANEL
Anion gap: 7 (ref 5–15)
BUN: 22 mg/dL (ref 8–23)
CO2: 30 mmol/L (ref 22–32)
Calcium: 9.2 mg/dL (ref 8.9–10.3)
Chloride: 97 mmol/L — ABNORMAL LOW (ref 98–111)
Creatinine, Ser: 1.15 mg/dL (ref 0.61–1.24)
GFR calc Af Amer: >60 mL/min (ref >60)
GFR calc non Af Amer: >60 mL/min (ref >60)
Glucose, Bld: 319 mg/dL — ABNORMAL HIGH (ref 70–99)
Potassium: 4 mmol/L (ref 3.5–5.1)
SODIUM: 134 mmol/L — AB (ref 135–145)

## 2018-09-10 LAB — GLUCOSE, CAPILLARY: GLUCOSE-CAPILLARY: 268 mg/dL — AB (ref 70–99)

## 2018-09-10 LAB — MAGNESIUM: Magnesium: 2.3 mg/dL (ref 1.7–2.4)

## 2018-09-10 MED ORDER — PREDNISONE 10 MG PO TABS: 60.0000 mg | ORAL_TABLET | Freq: Every day | ORAL | 0 refills | Status: DC

## 2018-09-10 NOTE — Progress Notes (Signed)
Patient given discharge instructions and all questions answered. PIV x1 removed and patient dressed himself. Discharged home with wife. Nephew came to get his power chair and belongings to take home.

## 2018-09-12 LAB — CULTURE, BLOOD (ROUTINE X 2)
CULTURE: NO GROWTH
Culture: NO GROWTH
Special Requests: ADEQUATE

## 2018-10-27 ENCOUNTER — Other Ambulatory Visit: Payer: Self-pay

## 2018-10-27 ENCOUNTER — Emergency Department (HOSPITAL_COMMUNITY): Payer: Medicare HMO

## 2018-10-27 ENCOUNTER — Inpatient Hospital Stay (HOSPITAL_COMMUNITY)
Admission: EM | Admit: 2018-10-27 | Discharge: 2018-10-30 | DRG: 193 | Disposition: A | Discharge status: Home / Self Care | Payer: Medicare HMO | Attending: Internal Medicine | Admitting: Internal Medicine

## 2018-10-27 ENCOUNTER — Encounter (HOSPITAL_COMMUNITY): Payer: Self-pay

## 2018-10-27 DIAGNOSIS — K219 Gastro-esophageal reflux disease without esophagitis: Secondary | ICD-10-CM | POA: Diagnosis present

## 2018-10-27 DIAGNOSIS — J9622 Acute and chronic respiratory failure with hypercapnia: Secondary | ICD-10-CM | POA: Diagnosis present

## 2018-10-27 DIAGNOSIS — J441 Chronic obstructive pulmonary disease with (acute) exacerbation: Secondary | ICD-10-CM | POA: Diagnosis present

## 2018-10-27 DIAGNOSIS — J9601 Acute respiratory failure with hypoxia: Secondary | ICD-10-CM | POA: Diagnosis present

## 2018-10-27 DIAGNOSIS — N400 Enlarged prostate without lower urinary tract symptoms: Secondary | ICD-10-CM | POA: Diagnosis not present

## 2018-10-27 DIAGNOSIS — J101 Influenza due to other identified influenza virus with other respiratory manifestations: Principal | ICD-10-CM | POA: Diagnosis present

## 2018-10-27 DIAGNOSIS — I10 Essential (primary) hypertension: Secondary | ICD-10-CM | POA: Diagnosis present

## 2018-10-27 DIAGNOSIS — Z87891 Personal history of nicotine dependence: Secondary | ICD-10-CM

## 2018-10-27 DIAGNOSIS — Z9981 Dependence on supplemental oxygen: Secondary | ICD-10-CM

## 2018-10-27 DIAGNOSIS — Z79899 Other long term (current) drug therapy: Secondary | ICD-10-CM

## 2018-10-27 DIAGNOSIS — E785 Hyperlipidemia, unspecified: Secondary | ICD-10-CM | POA: Diagnosis present

## 2018-10-27 DIAGNOSIS — J9621 Acute and chronic respiratory failure with hypoxia: Secondary | ICD-10-CM | POA: Diagnosis present

## 2018-10-27 DIAGNOSIS — E782 Mixed hyperlipidemia: Secondary | ICD-10-CM

## 2018-10-27 DIAGNOSIS — Z7984 Long term (current) use of oral hypoglycemic drugs: Secondary | ICD-10-CM

## 2018-10-27 DIAGNOSIS — F419 Anxiety disorder, unspecified: Secondary | ICD-10-CM | POA: Diagnosis present

## 2018-10-27 DIAGNOSIS — Z7951 Long term (current) use of inhaled steroids: Secondary | ICD-10-CM

## 2018-10-27 DIAGNOSIS — E1165 Type 2 diabetes mellitus with hyperglycemia: Secondary | ICD-10-CM | POA: Diagnosis present

## 2018-10-27 DIAGNOSIS — Z720 Tobacco use: Secondary | ICD-10-CM | POA: Diagnosis present

## 2018-10-27 LAB — BLOOD GAS, ARTERIAL
Acid-Base Excess: 0 mmol/L (ref 0.0–2.0)
Acid-base deficit: 0.3 mmol/L (ref 0.0–2.0)
Bicarbonate: 23.7 mmol/L (ref 20.0–28.0)
Bicarbonate: 23.5 mmol/L (ref 20.0–28.0)
DELIVERY SYSTEMS: POSITIVE
FIO2: 40
FIO2: 40
O2 SAT: 97.3 %
O2 Saturation: 97.9 %
PEEP: 6 cmH2O
Patient temperature: 38.3
Patient temperature: 37.1
RATE: 10 resp/min
pCO2 arterial: 54.5 mmHg — ABNORMAL HIGH (ref 32.0–48.0)
pCO2 arterial: 52 mmHg — ABNORMAL HIGH (ref 32.0–48.0)
pH, Arterial: 7.296 — ABNORMAL LOW (ref 7.350–7.450)
pH, Arterial: 7.307 — ABNORMAL LOW (ref 7.350–7.450)
pO2, Arterial: 107 mmHg (ref 83.0–108.0)
pO2, Arterial: 121 mmHg — ABNORMAL HIGH (ref 83.0–108.0)

## 2018-10-27 LAB — RESPIRATORY PANEL BY PCR
Adenovirus: NOT DETECTED
Bordetella pertussis: NOT DETECTED
Chlamydophila pneumoniae: NOT DETECTED
Coronavirus 229E: NOT DETECTED
Coronavirus HKU1: NOT DETECTED
Coronavirus NL63: NOT DETECTED
Coronavirus OC43: NOT DETECTED
Influenza A H1 2009: DETECTED — AB
Influenza B: NOT DETECTED
Metapneumovirus: NOT DETECTED
Mycoplasma pneumoniae: NOT DETECTED
Parainfluenza Virus 1: NOT DETECTED
Parainfluenza Virus 2: NOT DETECTED
Parainfluenza Virus 3: NOT DETECTED
Parainfluenza Virus 4: NOT DETECTED
RHINOVIRUS / ENTEROVIRUS - RVPPCR: NOT DETECTED
Respiratory Syncytial Virus: NOT DETECTED

## 2018-10-27 LAB — CBC WITH DIFFERENTIAL/PLATELET
Abs Immature Granulocytes: 0.05 10*3/uL (ref 0.00–0.07)
Basophils Absolute: 0 10*3/uL (ref 0.0–0.1)
Basophils Relative: 0 %
Eosinophils Absolute: 0 10*3/uL (ref 0.0–0.5)
Eosinophils Relative: 0 %
HCT: 41.1 % (ref 39.0–52.0)
Hemoglobin: 12.3 g/dL — ABNORMAL LOW (ref 13.0–17.0)
Immature Granulocytes: 0 %
LYMPHS ABS: 0.6 10*3/uL — AB (ref 0.7–4.0)
LYMPHS PCT: 6 %
MCH: 26.5 pg (ref 26.0–34.0)
MCHC: 29.9 g/dL — ABNORMAL LOW (ref 30.0–36.0)
MCV: 88.6 fL (ref 80.0–100.0)
Monocytes Absolute: 0.5 10*3/uL (ref 0.1–1.0)
Monocytes Relative: 4 %
Neutro Abs: 10.5 10*3/uL — ABNORMAL HIGH (ref 1.7–7.7)
Neutrophils Relative %: 90 %
Platelets: 295 10*3/uL (ref 150–400)
RBC: 4.64 MIL/uL (ref 4.22–5.81)
RDW: 14.5 % (ref 11.5–15.5)
WBC: 11.7 10*3/uL — AB (ref 4.0–10.5)
nRBC: 0 % (ref 0.0–0.2)

## 2018-10-27 LAB — URINALYSIS, ROUTINE W REFLEX MICROSCOPIC
Bacteria, UA: NONE SEEN
Bilirubin Urine: NEGATIVE
Glucose, UA: NEGATIVE mg/dL
KETONES UR: NEGATIVE mg/dL
Leukocytes, UA: NEGATIVE
Nitrite: NEGATIVE
PH: 5 (ref 5.0–8.0)
Protein, ur: NEGATIVE mg/dL
Specific Gravity, Urine: 1.018 (ref 1.005–1.030)

## 2018-10-27 LAB — BASIC METABOLIC PANEL
Anion gap: 9 (ref 5–15)
BUN: 13 mg/dL (ref 8–23)
CHLORIDE: 95 mmol/L — AB (ref 98–111)
CO2: 27 mmol/L (ref 22–32)
Calcium: 9.3 mg/dL (ref 8.9–10.3)
Creatinine, Ser: 1.1 mg/dL (ref 0.61–1.24)
GFR calc Af Amer: >60 mL/min (ref >60)
GFR calc non Af Amer: >60 mL/min (ref >60)
Glucose, Bld: 169 mg/dL — ABNORMAL HIGH (ref 70–99)
Potassium: 4.6 mmol/L (ref 3.5–5.1)
Sodium: 131 mmol/L — ABNORMAL LOW (ref 135–145)

## 2018-10-27 LAB — BLOOD GAS, VENOUS
ACID-BASE EXCESS: 0.7 mmol/L (ref 0.0–2.0)
Bicarbonate: 22 mmol/L (ref 20.0–28.0)
FIO2: 40
O2 SAT: 30.2 %
PATIENT TEMPERATURE: 37.2
pCO2, Ven: 71.5 mmHg (ref 44.0–60.0)
pH, Ven: 7.213 — ABNORMAL LOW (ref 7.250–7.430)
pO2, Ven: <32 mmHg — CL (ref 32.0–45.0)

## 2018-10-27 LAB — TROPONIN I: Troponin I: 0.13 ng/mL (ref <0.03)

## 2018-10-27 LAB — INFLUENZA PANEL BY PCR (TYPE A & B)
Influenza A By PCR: POSITIVE — AB
Influenza B By PCR: NEGATIVE

## 2018-10-27 LAB — CBG MONITORING, ED: Glucose-Capillary: 207 mg/dL — ABNORMAL HIGH (ref 70–99)

## 2018-10-27 LAB — LACTIC ACID, PLASMA
Lactic Acid, Venous: 1 mmol/L (ref 0.5–1.9)
Lactic Acid, Venous: 0.8 mmol/L (ref 0.5–1.9)

## 2018-10-27 LAB — BRAIN NATRIURETIC PEPTIDE: B Natriuretic Peptide: 31 pg/mL (ref 0.0–100.0)

## 2018-10-27 LAB — MAGNESIUM: Magnesium: 2 mg/dL (ref 1.7–2.4)

## 2018-10-27 LAB — PHOSPHORUS: Phosphorus: 3.3 mg/dL (ref 2.5–4.6)

## 2018-10-27 MED ORDER — VANCOMYCIN HCL 10 G IV SOLR
1500.0000 mg | Freq: Once | INTRAVENOUS | Status: AC
  Administered 2018-10-27: 1500 mg via INTRAVENOUS
  Filled 2018-10-27 (×2): qty 1500

## 2018-10-27 MED ORDER — BUDESONIDE 0.5 MG/2ML IN SUSP
0.5000 mg | Freq: Two times a day (BID) | RESPIRATORY_TRACT | Status: DC
  Administered 2018-10-27 – 2018-10-30 (×6): 0.5 mg via RESPIRATORY_TRACT
  Filled 2018-10-27 (×5): qty 2

## 2018-10-27 MED ORDER — SODIUM CHLORIDE 0.9 % IV SOLN
250.0000 mL | INTRAVENOUS | Status: DC | PRN
  Administered 2018-10-29: via INTRAVENOUS

## 2018-10-27 MED ORDER — LORATADINE 10 MG PO TABS
10.0000 mg | ORAL_TABLET | Freq: Every day | ORAL | Status: DC
  Administered 2018-10-27 – 2018-10-30 (×4): 10 mg via ORAL
  Filled 2018-10-27 (×4): qty 1

## 2018-10-27 MED ORDER — SODIUM CHLORIDE 0.9 % IV SOLN
2.0000 g | Freq: Once | INTRAVENOUS | Status: AC
  Administered 2018-10-27: 2 g via INTRAVENOUS
  Filled 2018-10-27: qty 2

## 2018-10-27 MED ORDER — HEPARIN SODIUM (PORCINE) 5000 UNIT/ML IJ SOLN
5000.0000 [IU] | Freq: Three times a day (TID) | INTRAMUSCULAR | Status: DC
  Administered 2018-10-27 – 2018-10-30 (×9): 5000 [IU] via SUBCUTANEOUS
  Filled 2018-10-27 (×9): qty 1

## 2018-10-27 MED ORDER — DM-GUAIFENESIN ER 30-600 MG PO TB12
1.0000 | ORAL_TABLET | Freq: Two times a day (BID) | ORAL | Status: DC
  Administered 2018-10-27 – 2018-10-30 (×7): 1 via ORAL
  Filled 2018-10-27 (×7): qty 1

## 2018-10-27 MED ORDER — ALBUTEROL SULFATE (2.5 MG/3ML) 0.083% IN NEBU: 2.5000 mg | INHALATION_SOLUTION | RESPIRATORY_TRACT | Status: DC | PRN

## 2018-10-27 MED ORDER — IPRATROPIUM-ALBUTEROL 0.5-2.5 (3) MG/3ML IN SOLN
3.0000 mL | RESPIRATORY_TRACT | Status: DC
  Administered 2018-10-27 – 2018-10-30 (×18): 3 mL via RESPIRATORY_TRACT
  Filled 2018-10-27 (×16): qty 3

## 2018-10-27 MED ORDER — SODIUM CHLORIDE 0.9% FLUSH
3.0000 mL | Freq: Two times a day (BID) | INTRAVENOUS | Status: DC
  Administered 2018-10-28 – 2018-10-30 (×3): 3 mL via INTRAVENOUS

## 2018-10-27 MED ORDER — ALFUZOSIN HCL ER 10 MG PO TB24
10.0000 mg | ORAL_TABLET | Freq: Every day | ORAL | Status: DC
  Administered 2018-10-28 – 2018-10-30 (×3): 10 mg via ORAL
  Filled 2018-10-27 (×5): qty 1

## 2018-10-27 MED ORDER — VANCOMYCIN HCL IN DEXTROSE 750-5 MG/150ML-% IV SOLN
750.0000 mg | Freq: Two times a day (BID) | INTRAVENOUS | Status: DC
  Administered 2018-10-27 – 2018-10-28 (×2): 750 mg via INTRAVENOUS
  Filled 2018-10-27 (×7): qty 150

## 2018-10-27 MED ORDER — ARFORMOTEROL TARTRATE 15 MCG/2ML IN NEBU
15.0000 ug | INHALATION_SOLUTION | Freq: Two times a day (BID) | RESPIRATORY_TRACT | Status: DC
  Administered 2018-10-28 – 2018-10-30 (×4): 15 ug via RESPIRATORY_TRACT
  Filled 2018-10-27 (×10): qty 2

## 2018-10-27 MED ORDER — OSELTAMIVIR PHOSPHATE 75 MG PO CAPS
75.0000 mg | ORAL_CAPSULE | Freq: Two times a day (BID) | ORAL | Status: DC
  Administered 2018-10-27 – 2018-10-28 (×2): 75 mg via ORAL
  Filled 2018-10-27: qty 1

## 2018-10-27 MED ORDER — PANTOPRAZOLE SODIUM 40 MG PO TBEC
40.0000 mg | DELAYED_RELEASE_TABLET | Freq: Every morning | ORAL | Status: DC
  Administered 2018-10-27 – 2018-10-30 (×4): 40 mg via ORAL
  Filled 2018-10-27 (×4): qty 1

## 2018-10-27 MED ORDER — SODIUM CHLORIDE 0.9 % IV SOLN
2.0000 g | Freq: Two times a day (BID) | INTRAVENOUS | Status: DC
  Administered 2018-10-27 – 2018-10-30 (×7): 2 g via INTRAVENOUS
  Filled 2018-10-27 (×9): qty 2

## 2018-10-27 MED ORDER — METHYLPREDNISOLONE SODIUM SUCC 125 MG IJ SOLR
60.0000 mg | Freq: Three times a day (TID) | INTRAMUSCULAR | Status: DC
  Administered 2018-10-27 – 2018-10-30 (×9): 60 mg via INTRAVENOUS
  Filled 2018-10-27 (×10): qty 2

## 2018-10-27 MED ORDER — IPRATROPIUM-ALBUTEROL 0.5-2.5 (3) MG/3ML IN SOLN
3.0000 mL | Freq: Four times a day (QID) | RESPIRATORY_TRACT | Status: DC
  Filled 2018-10-27: qty 3

## 2018-10-27 MED ORDER — SODIUM CHLORIDE 0.9 % IV BOLUS
1000.0000 mL | Freq: Once | INTRAVENOUS | Status: AC
  Administered 2018-10-27: 1000 mL via INTRAVENOUS

## 2018-10-27 MED ORDER — IPRATROPIUM-ALBUTEROL 0.5-2.5 (3) MG/3ML IN SOLN
3.0000 mL | Freq: Once | RESPIRATORY_TRACT | Status: AC
  Administered 2018-10-27: 3 mL via RESPIRATORY_TRACT
  Filled 2018-10-27: qty 3

## 2018-10-27 MED ORDER — VANCOMYCIN HCL IN DEXTROSE 1-5 GM/200ML-% IV SOLN: 1000.0000 mg | Freq: Once | INTRAVENOUS | Status: DC

## 2018-10-27 MED ORDER — ROSUVASTATIN CALCIUM 20 MG PO TABS
40.0000 mg | ORAL_TABLET | Freq: Every day | ORAL | Status: DC
  Administered 2018-10-27 – 2018-10-29 (×3): 40 mg via ORAL
  Filled 2018-10-27 (×6): qty 2

## 2018-10-27 MED ORDER — SODIUM CHLORIDE 0.9 % IV SOLN
Freq: Once | INTRAVENOUS | Status: AC
  Administered 2018-10-27: 11:00:00 via INTRAVENOUS

## 2018-10-27 MED ORDER — FINASTERIDE 5 MG PO TABS
5.0000 mg | ORAL_TABLET | Freq: Every day | ORAL | Status: DC
  Administered 2018-10-27 – 2018-10-29 (×3): 5 mg via ORAL
  Filled 2018-10-27 (×4): qty 1

## 2018-10-27 MED ORDER — SODIUM CHLORIDE 0.9 % IV SOLN
INTRAVENOUS | Status: AC
  Administered 2018-10-27: 23:00:00 via INTRAVENOUS

## 2018-10-27 MED ORDER — ACETAMINOPHEN 325 MG PO TABS
650.0000 mg | ORAL_TABLET | Freq: Four times a day (QID) | ORAL | Status: DC | PRN
  Administered 2018-10-29: 650 mg via ORAL
  Filled 2018-10-27: qty 2

## 2018-10-27 MED ORDER — SODIUM CHLORIDE 0.9% FLUSH: 3.0000 mL | INTRAVENOUS | Status: DC | PRN

## 2018-10-27 MED ORDER — ONDANSETRON HCL 4 MG/2ML IJ SOLN: 4.0000 mg | Freq: Four times a day (QID) | INTRAMUSCULAR | Status: DC | PRN

## 2018-10-27 MED ORDER — VANCOMYCIN HCL 10 G IV SOLR: 1500.0000 mg | Freq: Once | INTRAVENOUS | Status: DC

## 2018-10-27 MED ORDER — IPRATROPIUM-ALBUTEROL 0.5-2.5 (3) MG/3ML IN SOLN
RESPIRATORY_TRACT | Status: AC
  Administered 2018-10-27: 3 mL via RESPIRATORY_TRACT
  Filled 2018-10-27: qty 3

## 2018-10-27 MED ORDER — MONTELUKAST SODIUM 10 MG PO TABS
10.0000 mg | ORAL_TABLET | Freq: Every morning | ORAL | Status: DC
  Administered 2018-10-27 – 2018-10-30 (×4): 10 mg via ORAL
  Filled 2018-10-27 (×4): qty 1

## 2018-10-27 MED ORDER — OSELTAMIVIR PHOSPHATE 75 MG PO CAPS
75.0000 mg | ORAL_CAPSULE | Freq: Two times a day (BID) | ORAL | Status: DC
  Administered 2018-10-27 – 2018-10-30 (×6): 75 mg via ORAL
  Filled 2018-10-27 (×6): qty 1

## 2018-10-27 MED ORDER — DOXYCYCLINE HYCLATE 100 MG PO TABS
100.0000 mg | ORAL_TABLET | Freq: Two times a day (BID) | ORAL | Status: DC
  Administered 2018-10-27 – 2018-10-30 (×7): 100 mg via ORAL
  Filled 2018-10-27 (×8): qty 1

## 2018-10-27 MED ORDER — INSULIN ASPART 100 UNIT/ML ~~LOC~~ SOLN
0.0000 [IU] | Freq: Three times a day (TID) | SUBCUTANEOUS | Status: DC
  Administered 2018-10-28: 5 [IU] via SUBCUTANEOUS
  Administered 2018-10-28: 8 [IU] via SUBCUTANEOUS
  Administered 2018-10-28: 5 [IU] via SUBCUTANEOUS
  Administered 2018-10-29: 15 [IU] via SUBCUTANEOUS
  Administered 2018-10-29: 8 [IU] via SUBCUTANEOUS
  Administered 2018-10-29: 7 [IU] via SUBCUTANEOUS
  Administered 2018-10-30 (×2): 11 [IU] via SUBCUTANEOUS
  Filled 2018-10-27 (×2): qty 1

## 2018-10-27 MED ORDER — FLUTICASONE PROPIONATE 50 MCG/ACT NA SUSP
2.0000 | Freq: Every day | NASAL | Status: DC
  Administered 2018-10-27 – 2018-10-30 (×4): 2 via NASAL
  Filled 2018-10-27 (×2): qty 16

## 2018-10-27 MED ORDER — ALBUTEROL SULFATE (2.5 MG/3ML) 0.083% IN NEBU
2.5000 mg | INHALATION_SOLUTION | Freq: Once | RESPIRATORY_TRACT | Status: AC
  Administered 2018-10-27: 2.5 mg via RESPIRATORY_TRACT
  Filled 2018-10-27: qty 3

## 2018-10-27 MED ORDER — INSULIN ASPART 100 UNIT/ML ~~LOC~~ SOLN
0.0000 [IU] | Freq: Every day | SUBCUTANEOUS | Status: DC
  Administered 2018-10-28: 4 [IU] via SUBCUTANEOUS
  Administered 2018-10-29: 3 [IU] via SUBCUTANEOUS

## 2018-10-27 MED ORDER — METHYLPREDNISOLONE SODIUM SUCC 125 MG IJ SOLR
125.0000 mg | Freq: Once | INTRAMUSCULAR | Status: AC
  Administered 2018-10-27: 125 mg via INTRAVENOUS
  Filled 2018-10-27: qty 2

## 2018-10-27 MED ORDER — ALPRAZOLAM 0.5 MG PO TABS
0.5000 mg | ORAL_TABLET | Freq: Three times a day (TID) | ORAL | Status: DC | PRN
  Administered 2018-10-28 – 2018-10-30 (×2): 0.5 mg via ORAL
  Filled 2018-10-27 (×2): qty 1

## 2018-10-27 NOTE — ED Notes (Signed)
CRITICAL VALUE ALERT  Critical Value:  pc02 on venous blood gas 71.5  Date & Time Notied:  10/27/2018, 1037  Provider Notified: Dr. Gwenlyn Perking  Orders Received/Actions taken: see chart, abg ordered

## 2018-10-27 NOTE — ED Notes (Signed)
Patient's wife states if unable to reach her to call Candie Chroman at [phone].

## 2018-10-27 NOTE — ED Provider Notes (Signed)
[facility] EMERGENCY DEPARTMENT Provider Note   CSN: [id number] Arrival date & time: 10/27/18  0424     History   Chief Complaint Chief Complaint  Patient presents with  . Shortness of Breath    HPI Jeff Ray is a 63 y.o. male.  Patient presents to the emergency department for evaluation of difficulty breathing.  Patient reports that his symptoms have been present for 2 days.  He has had progressively worsening difficulty breathing.  He does have a cough but reports that he has a chronic cough, this has not changed much.  He is unaware of any fever.  He is brought to the ER by ambulance.  Patient is normally on 2 L of nasal cannula oxygen.  He has been requiring 6 L during transport to maintain his oxygen saturations.  He used an albuterol nebulizer approximately an hour before calling EMS.  This did not help his breathing.     Past Medical History:  Diagnosis Date  . Anxiety   . Asthma   . COPD (chronic obstructive pulmonary disease) (HCC)   . Diabetes mellitus   . History of nuclear stress test 02/2016   low risk study  . Hypertension   . On home O2     Patient Active Problem List   Diagnosis Date Noted  . COPD with acute exacerbation (HCC) 09/09/2018  . Chronic respiratory failure with hypoxia (HCC) 09/08/2018  . Uncontrolled type 2 diabetes mellitus with hyperglycemia (HCC) 09/08/2018  . Elevated troponin 09/08/2018  . SIRS (systemic inflammatory response syndrome) (HCC) 09/07/2018  . Asthma 09/07/2018  . HLD (hyperlipidemia) 09/07/2018  . GERD (gastroesophageal reflux disease) 09/07/2018  . Anxiety 09/07/2018  . BPH (benign prostatic hyperplasia) 09/07/2018  . Anemia 08/03/2015  . CAP (community acquired pneumonia) 06/02/2013  . Respiratory failure, acute and chronic (HCC) 05/26/2013  . COPD exacerbation (HCC) 05/26/2013  . HTN (hypertension) 05/26/2013  . DM (diabetes mellitus) (HCC) 05/26/2013  . Tobacco abuse 05/26/2013    Past Surgical History:    Procedure Laterality Date  . BACK SURGERY          Home Medications    Prior to Admission medications   Medication Sig Start Date End Date Taking? Authorizing Provider  albuterol (PROAIR HFA) 108 (90 BASE) MCG/ACT inhaler Inhale 2 puffs into the lungs every 6 (six) hours as needed for wheezing or shortness of breath. Shortness of Breath    [provider]  alfuzosin (UROXATRAL) 10 MG 24 hr tablet Take 10 mg by mouth daily with breakfast.    [provider]  ALPRAZolam (XANAX) 1 MG tablet Take 1 mg by mouth 3 (three) times daily as needed for anxiety. 09/30/17   [provider]  Ernestina PatchesANORO ELLIPTA 62.5-25 MCG/INH AEPB Take 1 puff by mouth daily. 08/31/17   [provider]  fexofenadine-pseudoephedrine (ALLEGRA-D 24) 180-240 MG 24 hr tablet Take 1 tablet by mouth daily.    [provider]  finasteride (PROSCAR) 5 MG tablet Take 5 mg by mouth daily.    [provider]  fluticasone (FLONASE) 50 MCG/ACT nasal spray Place 2 sprays into both nostrils daily. 09/30/17   [provider]  glimepiride (AMARYL) 2 MG tablet Take 2 mg by mouth daily with breakfast.    [provider]  Guaifenesin (MUCINEX MAXIMUM STRENGTH) 1200 MG TB12 Take 1 tablet by mouth 2 (two) times daily.     [provider]  ibuprofen (ADVIL,MOTRIN) 800 MG tablet Take 800 mg by mouth every  8 (eight) hours as needed for mild pain or moderate pain.    [provider]  ipratropium-albuterol (DUONEB) 0.5-2.5 (3) MG/3ML SOLN Take 3 mLs by nebulization every 4 (four) hours as needed. For shortness of breath    [provider]  metFORMIN (GLUCOPHAGE-XR) 500 MG 24 hr tablet Take 1,000 mg by mouth 2 (two) times daily.     [provider]  montelukast (SINGULAIR) 10 MG tablet Take 10 mg by mouth every morning.     [provider]  pantoprazole (PROTONIX) 40 MG tablet Take 40 mg by mouth every morning.     [provider]   predniSONE (DELTASONE) 10 MG tablet Take 6 tablets (60 mg total) by mouth daily with breakfast. And decrease by one tablet every 2 days 09/10/18   Tat, Onalee Huaavid, MD  rosuvastatin (CRESTOR) 40 MG tablet Take 40 mg by mouth at bedtime.  07/24/17   [provider]    Family History Family History  Problem Relation Age of Onset  . Diabetes Mother     Social History Social History   Tobacco Use  . Smoking status: Former Smoker    Packs/day: 0.00    Years: 10.00    Pack years: 0.00    Types: Cigarettes    Last attempt to quit: 10/20/2012    Years since quitting: 6.0  . Smokeless tobacco: Never Used  Substance Use Topics  . Alcohol use: No    Alcohol/week: 0.0 standard drinks  . Drug use: No     Allergies   Patient has no known allergies.   Review of Systems Review of Systems  Respiratory: Positive for cough and shortness of breath.   Cardiovascular: Negative for chest pain.  All other systems reviewed and are negative.    Physical Exam Updated Vital Signs BP (!) 147/78 (BP Location: Left Arm)   Pulse (!) 125   Temp (!) 101 F (38.3 C) (Oral)   Resp (!) 21   Ht 6\' 1"  (1.854 m)   Wt 80.7 kg   SpO2 100%   BMI 23.48 kg/m   Physical Exam Vitals signs and nursing note reviewed.  Constitutional:      General: He is not in acute distress.    Appearance: Normal appearance. He is well-developed.  HENT:     Head: Normocephalic and atraumatic.     Right Ear: Hearing normal.     Left Ear: Hearing normal.     Nose: Nose normal.  Eyes:     Conjunctiva/sclera: Conjunctivae normal.     Pupils: Pupils are equal, round, and reactive to light.  Neck:     Musculoskeletal: Normal range of motion and neck supple.  Cardiovascular:     Rate and Rhythm: Regular rhythm.     Heart sounds: S1 normal and S2 normal. No murmur. No friction rub. No gallop.   Pulmonary:     Effort: Tachypnea, accessory muscle usage and respiratory distress present.     Breath sounds: Decreased  breath sounds and wheezing present.  Chest:     Chest wall: No tenderness.  Abdominal:     General: Bowel sounds are normal.     Palpations: Abdomen is soft.     Tenderness: There is no abdominal tenderness. There is no guarding or rebound. Negative signs include Murphy's sign and McBurney's sign.     Hernia: No hernia is present.  Musculoskeletal: Normal range of motion.  Skin:    General: Skin is warm and dry.  Findings: No rash.  Neurological:     Mental Status: He is alert and oriented to person, place, and time.     GCS: GCS eye subscore is 4. GCS verbal subscore is 5. GCS motor subscore is 6.     Cranial Nerves: No cranial nerve deficit.     Sensory: No sensory deficit.     Coordination: Coordination normal.  Psychiatric:        Speech: Speech normal.        Behavior: Behavior normal.        Thought Content: Thought content normal.      ED Treatments / Results  Labs (all labs ordered are listed, but only abnormal results are displayed) Labs Reviewed  URINALYSIS, ROUTINE W REFLEX MICROSCOPIC - Abnormal; Notable for the following components:      Result Value   Hgb urine dipstick SMALL (*)    All other components within normal limits  INFLUENZA PANEL BY PCR (TYPE A & B) - Abnormal; Notable for the following components:   Influenza A By PCR POSITIVE (*)    All other components within normal limits  CULTURE, BLOOD (ROUTINE X 2)  CULTURE, BLOOD (ROUTINE X 2)  RESPIRATORY PANEL BY PCR  CBC WITH DIFFERENTIAL/PLATELET  BASIC METABOLIC PANEL  TROPONIN I  BRAIN NATRIURETIC PEPTIDE  LACTIC ACID, PLASMA  LACTIC ACID, PLASMA    EKG EKG Interpretation  Date/Time:  Friday October 27 2018 04:31:09 EST Ventricular Rate:  125 PR Interval:    QRS Duration: 122 QT Interval:  285 QTC Calculation: 411 R Axis:   110 Text Interpretation:  Sinus or ectopic atrial tachycardia Right bundle branch block No significant change since last tracing Confirmed by Gilda Crease ([id number]) on 10/27/2018 4:36:40 AM   Radiology Dg Chest Port 1 View  Result Date: 10/27/2018 CLINICAL DATA:  Respiratory distress.  History of COPD. EXAM: PORTABLE CHEST 1 VIEW COMPARISON:  Chest radiograph September 07, 2018. FINDINGS: Cardiomediastinal silhouette is normal. Interstitial prominence with confluent in lung bases, increased from prior examination. No pleural effusion. No pneumothorax. Soft tissue planes and included osseous structures are nonacute. IMPRESSION: Increased bibasilar interstitial prominence seen with atypical infection or atelectasis. Emphysema (ICD10-J43.9). Electronically Signed   By: Awilda Metro M.D.   On: 10/27/2018 05:11    Procedures Procedures (including critical care time)  Medications Ordered in ED Medications  ceFEPIme (MAXIPIME) 2 g in sodium chloride 0.9 % 100 mL IVPB (2 g Intravenous New Bag/Given 10/27/18 0538)  vancomycin (VANCOCIN) 1,500 mg in sodium chloride 0.9 % 500 mL IVPB (has no administration in time range)  oseltamivir (TAMIFLU) capsule 75 mg (has no administration in time range)  methylPREDNISolone sodium succinate (SOLU-MEDROL) 125 mg/2 mL injection 125 mg (125 mg Intravenous Given 10/27/18 0530)  ipratropium-albuterol (DUONEB) 0.5-2.5 (3) MG/3ML nebulizer solution 3 mL (3 mLs Nebulization Given 10/27/18 0439)  albuterol (PROVENTIL) (2.5 MG/3ML) 0.083% nebulizer solution 2.5 mg (2.5 mg Nebulization Given 10/27/18 0439)     Initial Impression / Assessment and Plan / ED Course  I have reviewed the triage vital signs and the nursing notes.  Pertinent labs & imaging results that were available during my care of the patient were reviewed by me and considered in my medical decision making (see chart for details).     Patient arrives in respiratory distress.  He has a history of COPD.  Examination reveals minimal air movement at arrival.  He is tachypneic and tachycardic.  He is found to be febrile at arrival.  He was initiated on treatment  for presumed sepsis.  Chest x-ray shows increased markings at the bases consistent with either atypical infection or atelectasis.  Patient is positive for influenza A.  Tamiflu was added to his regimen.  He was administered IV Solu-Medrol at arrival and bronchodilator therapy.  He will require hospitalization for further management.  CRITICAL CARE Performed by: Gilda Crease   Total critical care time: 35 minutes  Critical care time was exclusive of separately billable procedures and treating other patients.  Critical care was necessary to treat or prevent imminent or life-threatening deterioration.  Critical care was time spent personally by me on the following activities: development of treatment plan with patient and/or surrogate as well as nursing, discussions with consultants, evaluation of patient's response to treatment, examination of patient, obtaining history from patient or surrogate, ordering and performing treatments and interventions, ordering and review of laboratory studies, ordering and review of radiographic studies, pulse oximetry and re-evaluation of patient's condition.   Final Clinical Impressions(s) / ED Diagnoses   Final diagnoses:  COPD exacerbation [facility])  Influenza A    ED Discharge Orders    None       Gilda Crease, MD 10/27/18 [phone]

## 2018-10-27 NOTE — ED Notes (Signed)
Emptied urinal of 675 ml urine.

## 2018-10-27 NOTE — ED Notes (Signed)
Date and time results received: 10/27/18 0632  Test: Troponin Critical Value: 0.13  Name of Provider Notified: Blinda Leatherwood, MD

## 2018-10-27 NOTE — ED Triage Notes (Signed)
Pt in by [facility] for sob onset Wednesday.  Pt denies cp.   Pt uses home oxygen.

## 2018-10-27 NOTE — ED Notes (Signed)
Emptied urinal of approximately 650 ml urine.

## 2018-10-27 NOTE — ED Notes (Signed)
Family at bedside. 

## 2018-10-27 NOTE — H&P (Addendum)
History and Physical    Jeff Ray JPV:[id number] DOB: 1956/02/21 DOA: 10/27/2018  Referring MD/NP/PA: Dr. Blinda Leatherwood PCP: Gareth Morgan, MD  Patient coming from: Home  Chief Complaint: Shortness of breath  HPI: Jeff Ray is a 63 y.o. male with PMH of anxiety, asthma, chronic resp failure due to COPD (wears 2 L of nasal cannula at baseline), BPH, DM type 2 and hypertension who came to the ED due to shortness of breath that started 2 days ago and has been progressively worsening. Reports cough with brownish thick mucus and complains of nausea without vomiting. Patient also expressed sore throat and abdominal pain that he associates with persistent coughing.  Denies fevers, headaches, chest pain, back pain, dysuria or hematuria, Denies changes on bowel movements, hematochezia or melena.   Of note; states his brother and brother's girlfriend have been sick with the flu.  In the ED patient was found to be hypoxic, with diffuse expiratory wheezing and requiring 6 L of oxygen supplementation.  Labored breathing continues to the point of requiring BiPAP placement to stabilize his breathing.  Patient was found to be positive for influenza A.  He received steroids, nebulizer treatment, antibiotics and Tamiflu.  [facility] has been contacted to place patient in the hospital for further evaluation and management.   Past Medical/Surgical History: Past Medical History:  Diagnosis Date  . Anxiety   . Asthma   . COPD (chronic obstructive pulmonary disease) (HCC)   . Diabetes mellitus   . History of nuclear stress test 02/2016   low risk study  . Hypertension   . On home O2     Past Surgical History:  Procedure Laterality Date  . BACK SURGERY      Social History:  reports that he quit smoking about 6 years ago. His smoking use included cigarettes. He smoked 0.00 packs per day for 10.00 years. He has never used smokeless tobacco. He reports that he does not drink alcohol or use  drugs.  Allergies: No Known Allergies  Family History:  Family History  Problem Relation Age of Onset  . Diabetes Mother     Prior to Admission medications   Medication Sig Start Date End Date Taking? Authorizing Provider  albuterol (PROAIR HFA) 108 (90 BASE) MCG/ACT inhaler Inhale 2 puffs into the lungs every 6 (six) hours as needed for wheezing or shortness of breath. Shortness of Breath    [provider]  alfuzosin (UROXATRAL) 10 MG 24 hr tablet Take 10 mg by mouth daily with breakfast.    [provider]  ALPRAZolam (XANAX) 1 MG tablet Take 1 mg by mouth 3 (three) times daily as needed for anxiety. 09/30/17   [provider]  Ernestina Patches 62.5-25 MCG/INH AEPB Take 1 puff by mouth daily. 08/31/17   [provider]  fexofenadine-pseudoephedrine (ALLEGRA-D 24) 180-240 MG 24 hr tablet Take 1 tablet by mouth daily.    [provider]  finasteride (PROSCAR) 5 MG tablet Take 5 mg by mouth daily.    [provider]  fluticasone (FLONASE) 50 MCG/ACT nasal spray Place 2 sprays into both nostrils daily. 09/30/17   [provider]  glimepiride (AMARYL) 2 MG tablet Take 2 mg by mouth daily with breakfast.    [provider]  Guaifenesin (MUCINEX MAXIMUM STRENGTH) 1200 MG TB12 Take 1 tablet by mouth 2 (two) times daily.     [provider]  ibuprofen (ADVIL,MOTRIN) 800 MG tablet Take 800 mg by mouth every 8 (eight) hours as  needed for mild pain or moderate pain.    [provider]  ipratropium-albuterol (DUONEB) 0.5-2.5 (3) MG/3ML SOLN Take 3 mLs by nebulization every 4 (four) hours as needed. For shortness of breath    [provider]  metFORMIN (GLUCOPHAGE-XR) 500 MG 24 hr tablet Take 1,000 mg by mouth 2 (two) times daily.     [provider]  montelukast (SINGULAIR) 10 MG tablet Take 10 mg by mouth every morning.     [provider]  pantoprazole (PROTONIX) 40 MG tablet Take 40 mg  by mouth every morning.     [provider]  predniSONE (DELTASONE) 10 MG tablet Take 6 tablets (60 mg total) by mouth daily with breakfast. And decrease by one tablet every 2 days 09/10/18   Tat, Onalee Hua, MD  rosuvastatin (CRESTOR) 40 MG tablet Take 40 mg by mouth at bedtime.  07/24/17   [provider]    Review of Systems:  Negative except as otherwise mentioned in HPI  Physical Exam: Vitals:   10/27/18 0648 10/27/18 0700 10/27/18 0738 10/27/18 0756  BP: (!) 145/70 139/60    Pulse: (!) 121 (!) 120  (!) 119  Resp: (!) 34 (!) 37  (!) 26  Temp:   98.8 F (37.1 C)   TempSrc:   Oral   SpO2: 100% 100%  100%  Weight:      Height:        Constitutional: Afebrile currently, reporting some bilateral costal discomfort from coughing spells.  Patient is wearing BiPAP. Calm and able to follow commands. Eyes: PERRL, lids and conjunctivae normal, no icterus, no nystagmus. ENMT: Mucous membranes are moist. Posterior pharynx clear of any exudate or lesions. Neck: normal, supple, no masses, no thyromegaly; no JVD. Respiratory: Decreased breath sounds and positive expiratory wheezing. Increased respiratory effort. Mild Use of accessory muscles. Cardiovascular: Regular rate and rhythm, no murmurs / rubs / gallops. No extremity edema. 2+ pedal pulses. No carotid bruits.  Abdomen: no masses palpated. No hepatosplenomegaly. Bowel sounds positive.  Musculoskeletal: no clubbing or cyanosis. No joint deformity upper and lower extremities. Good ROM.  Normal muscle tone.  Skin: no rashes, lesions, ulcers.  Neurologic: cranial nerves 2-12 grossly intact. Sensation intact, DTR normal. Strength 5/5 in all 4.  Psychiatric: Normal judgment and insight. Alert and oriented x 3. Normal mood. Normal speech   Labs on Admission: I have personally reviewed the following labs and imaging studies  CBC: Recent Labs  Lab 10/27/18 0529  WBC 11.7*  NEUTROABS 10.5*  HGB 12.3*  HCT 41.1  MCV 88.6  PLT  295   Basic Metabolic Panel: Recent Labs  Lab 10/27/18 0529  NA 131*  K 4.6  CL 95*  CO2 27  GLUCOSE 169*  BUN 13  CREATININE 1.10  CALCIUM 9.3   GFR: Estimated Creatinine Clearance: 77.7 mL/min (by C-G formula based on SCr of 1.1 mg/dL).  Cardiac Enzymes: Recent Labs  Lab 10/27/18 0529  TROPONINI 0.13*   Urine analysis:    Component Value Date/Time   COLORURINE YELLOW 10/27/2018 0458   APPEARANCEUR CLEAR 10/27/2018 0458   LABSPEC 1.018 10/27/2018 0458   PHURINE 5.0 10/27/2018 0458   GLUCOSEU NEGATIVE 10/27/2018 0458   HGBUR SMALL (A) 10/27/2018 0458   BILIRUBINUR NEGATIVE 10/27/2018 0458   KETONESUR NEGATIVE 10/27/2018 0458   PROTEINUR NEGATIVE 10/27/2018 0458   UROBILINOGEN 0.2 02/08/2008 1731   NITRITE NEGATIVE 10/27/2018 0458   LEUKOCYTESUR NEGATIVE 10/27/2018 0458    Radiological Exams on Admission: Dg Chest  Port 1 View  Result Date: 10/27/2018 CLINICAL DATA:  Respiratory distress.  History of COPD. EXAM: PORTABLE CHEST 1 VIEW COMPARISON:  Chest radiograph September 07, 2018. FINDINGS: Cardiomediastinal silhouette is normal. Interstitial prominence with confluent in lung bases, increased from prior examination. No pleural effusion. No pneumothorax. Soft tissue planes and included osseous structures are nonacute. IMPRESSION: Increased bibasilar interstitial prominence seen with atypical infection or atelectasis. Emphysema (ICD10-J43.9). Electronically Signed   By: Awilda Metroourtnay  Bloomer M.D.   On: 10/27/2018 05:11    EKG: Independently reviewed.  No tachycardia, normal QT, positive right bundle branch block (unchanged from previous tracings).  Assessment/Plan 1-acute on chronic respiratory failure with hypoxemia -In the setting of influenza a and COPD exacerbation/bronchiectasis -Started on Tamiflu -Nebulizer treatment, steroids, antibiotics, Pulmicort/Brovana and Mucinex. -Currently on BiPAP; with intention to wean oxygen supplementation down as tolerated. -Gentle  fluid resuscitation -Supportive care, as needed antipyretics, analgesics and antiemetics. -Follow sputum culture -Follow clinical response. -Patient will be admitted to stepdown bed to closely assess his respiratory status  2-history of asthma/allergic rhinitis -as mentioned above he will be on a steroids and nebulizer treatments -Continue montelukast -Continue fluticasone and Claritin   3-Hypertension -Vital signs stable at this moment -Blood pressure well controlled with the use of alpha blockers.  4-Diabetes Mellitus 2 -hold oral hypoglycemic agents -Check A1c -Patient will be started on sliding scale insulin.  5-Anxiety -Continue alprazolam as needed  6-BPH -Denies symptoms of urinary retention -Continue the use of alfuzosin and finasteride  7-GERD/GI prophylaxis -Continue Protonix  8-hyperlipidemia -Continue Crestor   DVT prophylaxis: Heparin Code Status: FULL Family Communication: No family members at bedside  Disposition Plan: Hopefully discharge back home once respiratory status improved and at baseline. Consults called: None Admission status: observation, length of stay > than 2 midnights; stepdown bed.   Time Spent: 70 minutes  Vassie Lollarlos Jeff Sainsbury MD [facility] Pager [phone]   10/27/2018, 8:00 AM

## 2018-10-27 NOTE — ED Notes (Signed)
Unable to check pulse ox while ambulating due to patient being on B-PAP

## 2018-10-27 NOTE — ED Provider Notes (Signed)
  Provider Note MRN:  [id number]  Arrival date & time: 10/27/18    ED Course and Medical Decision Making  I received sign out for this patient at shift change from Dr. Blinda Leatherwood.  Hypercarbic respiratory failure in this 63 year old male with history of COPD, suspect influenza.  Blood gas revealing mild acidosis, mild hypercarbia.  Patient seems to be improving on BiPAP, will have respiratory therapy increase the pressure support and/or PEEP to ensure that he does not continue to retain CO2.  Will monitor closely.  Critical Care Documentation Critical care time provided by me (excluding procedures): 32 minutes  Condition necessitating critical care: Hypercarbic respiratory failure  Components of critical care management: reviewing of prior records, laboratory and imaging interpretation, frequent re-examination and reassessment of vital signs, administration of noninvasive positive pressure ventilation.    Elmer Sow. Pilar Plate, MD [facility] Emergency [facility] [email or web address]     Sabas Sous, MD 10/27/18 [phone]

## 2018-10-27 NOTE — Progress Notes (Signed)
Pharmacy Antibiotic Note  Jeff Ray is a 63 y.o. male admitted on 10/27/2018 with HCAP.  Pharmacy has been consulted for vancomycin and cefepime  Dosing. Patient is positive for Influenza A.  Plan: Start cefepime 2g IV q12h Loading dose:   Vancomycin  1.5g IV x1 dose Maintenance dose:   Vancomycin  750mg  IV q12h Goal vancomycin  trough range:  15-20  mcg/mL Pharmacy will continue to monitor renal function, vancomycin troughs as clinically indicated, cultures and patient progress.   Height: 6\' 1"  (185.4 cm) Weight: 178 lb (80.7 kg) IBW/kg (Calculated) : 79.9  Temp (24hrs), Avg:99.9 F (37.7 C), Min:98.8 F (37.1 C), Max:101 F (38.3 C)  Recent Labs  Lab 10/27/18 0529 10/27/18 0737  WBC 11.7*  --   CREATININE 1.10  --   LATICACIDVEN 1.0 0.8    Estimated Creatinine Clearance: 77.7 mL/min (by C-G formula based on SCr of 1.1 mg/dL).    No Known Allergies  Antimicrobials this admission: 1/24 vancomycin >>   1/24 cefepime >>    Microbiology results: 1/24 [facility] x2:  NG<12h  1/24 Resp PCR: sent     Thank you for allowing pharmacy to be a part of this patient's care.  Tama High 10/27/2018 11:10 AM

## 2018-10-27 NOTE — ED Notes (Signed)
ED TO INPATIENT HANDOFF REPORT  Name/Age/Gender Jeff ImusKarl E Ray 63 y.o. male  Code Status    Code Status Orders  (From admission, onward)         Start     Ordered   10/27/18 1327  Full code  Continuous     10/27/18 1326        Code Status History    Date Active Date Inactive Code Status Order ID Comments User Context   09/07/2018 2021 09/10/2018 1348 Full Code [id number]  John Giovanniathore, Vasundhra, MD ED   10/15/2017 2207 10/17/2017 1652 Full Code [id number]  Onnie BoerEmokpae, Ejiroghene E, MD Inpatient   08/03/2015 2130 08/06/2015 1236 Full Code [id number]  Ron ParkerJenkins, Harvette C, MD Inpatient   05/26/2013 1954 06/03/2013 1426 Full Code [id number]  Romagnoli SingerGosrani, Nimish C, MD ED      Home/SNF/Other Home  Chief Complaint sob  Level of Care/Admitting Diagnosis ED Disposition    ED Disposition Condition Comment   Admit  [facility] [[id number]]  Level of Care: Stepdown [14]  Diagnosis: COPD with acute exacerbation [facility](HCC) [[id number]]  Admitting Physician: Briscoe DeutscherPYD, TIMOTHY S [[id number]]  Attending Physician: Briscoe DeutscherPYD, TIMOTHY S [[id number]]  PT Class (Do Not Modify): Observation [104]  PT Acc Code (Do Not Modify): Observation [10022]       Medical History Past Medical History:  Diagnosis Date  . Anxiety   . Asthma   . COPD (chronic obstructive pulmonary disease) (HCC)   . Diabetes mellitus   . History of nuclear stress test 02/2016   low risk study  . Hypertension   . On home O2     Allergies No Known Allergies  IV Location/Drains/Wounds Patient Lines/Drains/Airways Status   Active Line/Drains/Airways    Name:   Placement date:   Placement time:   Site:   Days:   Peripheral IV 10/27/18 Right Antecubital   10/27/18    0457    Antecubital   less than 1   Peripheral IV 10/27/18 Right Hand   10/27/18    0457    Hand   less than 1          Labs/Imaging Results for orders placed or performed during the hospital encounter of 10/27/18 (from the past 48 hour(s))  Urinalysis, Routine w  reflex microscopic     Status: Abnormal   Collection Time: 10/27/18  4:58 AM  Result Value Ref Range   Color, Urine YELLOW YELLOW   APPearance CLEAR CLEAR   Specific Gravity, Urine 1.018 1.005 - 1.030   pH 5.0 5.0 - 8.0   Glucose, UA NEGATIVE NEGATIVE mg/dL   Hgb urine dipstick SMALL (A) NEGATIVE   Bilirubin Urine NEGATIVE NEGATIVE   Ketones, ur NEGATIVE NEGATIVE mg/dL   Protein, ur NEGATIVE NEGATIVE mg/dL   Nitrite NEGATIVE NEGATIVE   Leukocytes, UA NEGATIVE NEGATIVE   RBC / HPF 0-5 0 - 5 RBC/hpf   WBC, UA 0-5 0 - 5 WBC/hpf   Bacteria, UA NONE SEEN NONE SEEN   Squamous Epithelial / LPF 0-5 0 - 5   Mucus PRESENT     Comment: Performed at [facility], [address] [place], [place] [address]  Influenza panel by PCR (type A & B)     Status: Abnormal   Collection Time: 10/27/18  4:58 AM  Result Value Ref Range   Influenza A By PCR POSITIVE (A) NEGATIVE   Influenza B By PCR NEGATIVE NEGATIVE    Comment: (NOTE) The Xpert Xpress Flu assay is intended as an  aid in the diagnosis of  influenza and should not be used as a sole basis for treatment.  This  assay is FDA approved for nasopharyngeal swab specimens only. Nasal  washings and aspirates are unacceptable for Xpert Xpress Flu testing. Performed at [facility], [address], [place], [place] [address]   Respiratory Panel by PCR     Status: Abnormal   Collection Time: 10/27/18  4:58 AM  Result Value Ref Range   Adenovirus NOT DETECTED NOT DETECTED   Coronavirus 229E NOT DETECTED NOT DETECTED   Coronavirus HKU1 NOT DETECTED NOT DETECTED   Coronavirus NL63 NOT DETECTED NOT DETECTED   Coronavirus OC43 NOT DETECTED NOT DETECTED   Metapneumovirus NOT DETECTED NOT DETECTED   Rhinovirus / Enterovirus NOT DETECTED NOT DETECTED   Influenza A H1 2009 DETECTED (A) NOT DETECTED   Influenza B NOT DETECTED NOT DETECTED   Parainfluenza Virus 1 NOT DETECTED NOT DETECTED   Parainfluenza Virus 2 NOT DETECTED NOT DETECTED   Parainfluenza  Virus 3 NOT DETECTED NOT DETECTED   Parainfluenza Virus 4 NOT DETECTED NOT DETECTED   Respiratory Syncytial Virus NOT DETECTED NOT DETECTED   Bordetella pertussis NOT DETECTED NOT DETECTED   Chlamydophila pneumoniae NOT DETECTED NOT DETECTED   Mycoplasma pneumoniae NOT DETECTED NOT DETECTED    Comment: Performed at [facility], [address] [place], [place] [address]  Magnesium     Status: None   Collection Time: 10/27/18  5:27 AM  Result Value Ref Range   Magnesium 2.0 1.7 - 2.4 mg/dL    Comment: Performed at [facility] [address], [place], [place] [address]  Phosphorus     Status: None   Collection Time: 10/27/18  5:27 AM  Result Value Ref Range   Phosphorus 3.3 2.5 - 4.6 mg/dL    Comment: Performed at [facility], [address], [place], [place] [address]  CBC with Differential/Platelet     Status: Abnormal   Collection Time: 10/27/18  5:29 AM  Result Value Ref Range   WBC 11.7 (H) 4.0 - 10.5 K/uL   RBC 4.64 4.22 - 5.81 MIL/uL   Hemoglobin 12.3 (L) 13.0 - 17.0 g/dL   HCT 29.5 62.1 - 30.8 %   MCV 88.6 80.0 - 100.0 fL   MCH 26.5 26.0 - 34.0 pg   MCHC 29.9 (L) 30.0 - 36.0 g/dL   RDW 65.7 84.6 - 96.2 %   Platelets 295 150 - 400 K/uL   nRBC 0.0 0.0 - 0.2 %   Neutrophils Relative % 90 %   Neutro Abs 10.5 (H) 1.7 - 7.7 K/uL   Lymphocytes Relative 6 %   Lymphs Abs 0.6 (L) 0.7 - 4.0 K/uL   Monocytes Relative 4 %   Monocytes Absolute 0.5 0.1 - 1.0 K/uL   Eosinophils Relative 0 %   Eosinophils Absolute 0.0 0.0 - 0.5 K/uL   Basophils Relative 0 %   Basophils Absolute 0.0 0.0 - 0.1 K/uL   Immature Granulocytes 0 %   Abs Immature Granulocytes 0.05 0.00 - 0.07 K/uL    Comment: Performed at [facility], [address] [place], [place] [address]  Basic metabolic panel     Status: Abnormal   Collection Time: 10/27/18  5:29 AM  Result Value Ref Range   Sodium 131 (L) 135 - 145 mmol/L   Potassium 4.6 3.5 - 5.1 mmol/L   Chloride 95 (L) 98 - 111 mmol/L   CO2 27 22 - 32  mmol/L  Glucose, Bld 169 (H) 70 - 99 mg/dL   BUN 13 8 - 23 mg/dL   Creatinine, Ser 1.611.10 0.61 - 1.24 mg/dL   Calcium 9.3 8.9 - 09.610.3 mg/dL   GFR calc non Af Amer >60 >60 mL/min   GFR calc Af Amer >60 >60 mL/min   Anion gap 9 5 - 15    Comment: Performed at [facility], [address] [place], [place] [address]  Troponin I - ONCE - STAT     Status: Abnormal   Collection Time: 10/27/18  5:29 AM  Result Value Ref Range   Troponin I 0.13 (HH) <0.03 ng/mL    Comment: CRITICAL RESULT CALLED TO, READ BACK BY AND VERIFIED WITH: SHAPPELT,J AT 6:35AM ON 10/27/18 BY [facility] CenterFESTERMAN,C Performed at [facility], [address] [place], [place] [address]   Brain natriuretic peptide     Status: None   Collection Time: 10/27/18  5:29 AM  Result Value Ref Range   B Natriuretic Peptide 31.0 0.0 - 100.0 pg/mL    Comment: Performed at [facility], [address] [place], [place] [address]  Lactic acid, plasma     Status: None   Collection Time: 10/27/18  5:29 AM  Result Value Ref Range   Lactic Acid, Venous 1.0 0.5 - 1.9 mmol/L    Comment: Performed at [facility], [address] [place], [place] [address]  Blood Culture (routine x 2)     Status: None (Preliminary result)   Collection Time: 10/27/18  5:29 AM  Result Value Ref Range   Specimen Description BLOOD LEFT ARM    Special Requests      BOTTLES DRAWN AEROBIC AND ANAEROBIC Blood Culture adequate volume   Culture      NO GROWTH < 12 HOURS Performed at [facility], [address] [place], [place] [address]    Report Status PENDING   Blood Culture (routine x 2)     Status: None (Preliminary result)   Collection Time: 10/27/18  5:38 AM  Result Value Ref Range   Specimen Description BLOOD LEFT HAND    Special Requests      BOTTLES DRAWN AEROBIC ONLY Blood Culture results may not be optimal due to an inadequate volume of blood received in culture bottles   Culture      NO GROWTH < 12 HOURS Performed at [facility], [address]  [place], [place] [address]    Report Status PENDING   Blood gas, arterial     Status: Abnormal   Collection Time: 10/27/18  7:00 AM  Result Value Ref Range   FIO2 40.00    Delivery systems BILEVEL POSITIVE AIRWAY PRESSURE    LHR 10 resp/min   Peep/cpap 6.0 cm H20   pH, Arterial 7.296 (L) 7.350 - 7.450   pCO2 arterial 54.5 (H) 32.0 - 48.0 mmHg   pO2, Arterial 107 83.0 - 108.0 mmHg   Bicarbonate 23.7 20.0 - 28.0 mmol/L   Acid-Base Excess 0.0 0.0 - 2.0 mmol/L   O2 Saturation 97.3 %   Patient temperature 38.3    Collection site LEFT RADIAL    Allens test (pass/fail) PASS PASS    Comment: Performed at [facility], [address] [place], [place] [address]  Lactic acid, plasma     Status: None   Collection Time: 10/27/18  7:37 AM  Result Value Ref Range   Lactic Acid, Venous 0.8 0.5 - 1.9 mmol/L    Comment: Performed at [facility], [address] [place],  [place] [address]  Blood gas, venous     Status: Abnormal   Collection Time: 10/27/18  9:38 AM  Result Value Ref Range   FIO2 40.00    pH, Ven 7.213 (L) 7.250 - 7.430   pCO2, Ven 71.5 (HH) 44.0 - 60.0 mmHg    Comment: CRITICAL RESULT CALLED TO, READ BACK BY AND VERIFIED WITH: CARDWELL,L AT 10:10AM ON 10/27/18 BY FESTERMAN,C    pO2, Ven <32.0 (LL) 32.0 - 45.0 mmHg    Comment: CRITICAL RESULT CALLED TO, READ BACK BY AND VERIFIED WITH: HOLCOMB,R AT 12:10AM ON 10/27/18 BY FESTERMAN,C    Bicarbonate 22.0 20.0 - 28.0 mmol/L   Acid-Base Excess 0.7 0.0 - 2.0 mmol/L   O2 Saturation 30.2 %   Patient temperature 37.2     Comment: Performed at [facility], [address], [place], [place] [address]  Blood gas, arterial     Status: Abnormal   Collection Time: 10/27/18 10:37 AM  Result Value Ref Range   FIO2 40.00    pH, Arterial 7.307 (L) 7.350 - 7.450   pCO2 arterial 52.0 (H) 32.0 - 48.0 mmHg   pO2, Arterial 121 (H) 83.0 - 108.0 mmHg   Bicarbonate 23.5 20.0 - 28.0 mmol/L   Acid-base deficit 0.3 0.0 - 2.0 mmol/L   O2 Saturation 97.9 %    Patient temperature 37.1    Allens test (pass/fail) BRACHIAL ARTERY (A) PASS    Comment: Performed at [facility], [address], [place], [place] [address]   Dg Chest Port 1 View  Result Date: 10/27/2018 CLINICAL DATA:  Respiratory distress.  History of COPD. EXAM: PORTABLE CHEST 1 VIEW COMPARISON:  Chest radiograph September 07, 2018. FINDINGS: Cardiomediastinal silhouette is normal. Interstitial prominence with confluent in lung bases, increased from prior examination. No pleural effusion. No pneumothorax. Soft tissue planes and included osseous structures are nonacute. IMPRESSION: Increased bibasilar interstitial prominence seen with atypical infection or atelectasis. Emphysema (ICD10-J43.9). Electronically Signed   By: Awilda Metro M.D.   On: 10/27/2018 05:11    Pending Labs Unresulted Labs (From admission, onward)    Start     Ordered   10/28/18 0500  CBC  Tomorrow morning,   R     10/27/18 1326   10/28/18 0500  Comprehensive metabolic panel  Tomorrow morning,   R     10/27/18 1326   10/27/18 1855  Hemoglobin A1c  Once,   R    Comments:  To assess prior glycemic control    10/27/18 1855   10/27/18 1327  HIV antibody  Once,   R     10/27/18 1326   10/27/18 1327  Culture, sputum-assessment  Once,   R     10/27/18 1326          Vitals/Pain Today's Vitals   10/27/18 1900 10/27/18 1956 10/27/18 2000 10/27/18 2037  BP: 136/74 136/74 135/73   Pulse: (!) 102 100 (!) 101   Resp: (!) 30 (!) 28 20   Temp:      TempSrc:      SpO2: 98% 100% 100%   Weight:      Height:      PainSc:    0-No pain    Isolation Precautions Droplet precaution  Medications Medications  oseltamivir (TAMIFLU) capsule 75 mg (75 mg Oral See Procedure Record 10/27/18 1000)  rosuvastatin (CRESTOR) tablet 40 mg (has no administration in time range)  ALPRAZolam (XANAX) tablet 0.5 mg (has no administration in time range)  pantoprazole (PROTONIX) EC tablet  40 mg (40 mg Oral Given 10/27/18 1540)   alfuzosin (UROXATRAL) 24 hr tablet 10 mg (has no administration in time range)  finasteride (PROSCAR) tablet 5 mg (5 mg Oral Given 10/27/18 1826)  loratadine (CLARITIN) tablet 10 mg (10 mg Oral Given 10/27/18 1540)  fluticasone (FLONASE) 50 MCG/ACT nasal spray 2 spray (2 sprays Each Nare Given 10/27/18 1826)  montelukast (SINGULAIR) tablet 10 mg (10 mg Oral Given 10/27/18 1541)  heparin injection 5,000 Units (5,000 Units Subcutaneous Given 10/27/18 1545)  0.9 %  sodium chloride infusion ( Intravenous Not Given 10/27/18 2036)  sodium chloride flush (NS) 0.9 % injection 3 mL (3 mLs Intravenous Not Given 10/27/18 2035)  sodium chloride flush (NS) 0.9 % injection 3 mL (has no administration in time range)  0.9 %  sodium chloride infusion (has no administration in time range)  doxycycline (VIBRA-TABS) tablet 100 mg (100 mg Oral Given 10/27/18 1540)  methylPREDNISolone sodium succinate (SOLU-MEDROL) 125 mg/2 mL injection 60 mg (60 mg Intravenous Given 10/27/18 1544)  oseltamivir (TAMIFLU) capsule 75 mg (75 mg Oral Given 10/27/18 1826)  arformoterol (BROVANA) nebulizer solution 15 mcg (15 mcg Nebulization Not Given 10/27/18 1343)  budesonide (PULMICORT) nebulizer solution 0.5 mg (0.5 mg Nebulization Given 10/27/18 1956)  albuterol (PROVENTIL) (2.5 MG/3ML) 0.083% nebulizer solution 2.5 mg (has no administration in time range)  acetaminophen (TYLENOL) tablet 650 mg (has no administration in time range)  ondansetron (ZOFRAN) injection 4 mg (has no administration in time range)  dextromethorphan-guaiFENesin (MUCINEX DM) 30-600 MG per 12 hr tablet 1 tablet (1 tablet Oral Given 10/27/18 1541)  ceFEPIme (MAXIPIME) 2 g in sodium chloride 0.9 % 100 mL IVPB (0 g Intravenous Stopped 10/27/18 1324)  vancomycin (VANCOCIN) IVPB 750 mg/150 ml premix (0 mg Intravenous Stopped 10/27/18 1831)  ipratropium-albuterol (DUONEB) 0.5-2.5 (3) MG/3ML nebulizer solution 3 mL (3 mLs Nebulization Given 10/27/18 1956)  insulin aspart (novoLOG)  injection 0-15 Units (has no administration in time range)  insulin aspart (novoLOG) injection 0-5 Units (has no administration in time range)  methylPREDNISolone sodium succinate (SOLU-MEDROL) 125 mg/2 mL injection 125 mg (125 mg Intravenous Given 10/27/18 0530)  ipratropium-albuterol (DUONEB) 0.5-2.5 (3) MG/3ML nebulizer solution 3 mL (3 mLs Nebulization Given 10/27/18 0439)  albuterol (PROVENTIL) (2.5 MG/3ML) 0.083% nebulizer solution 2.5 mg (2.5 mg Nebulization Given 10/27/18 0439)  ceFEPIme (MAXIPIME) 2 g in sodium chloride 0.9 % 100 mL IVPB (0 g Intravenous Stopped 10/27/18 0609)  vancomycin (VANCOCIN) 1,500 mg in sodium chloride 0.9 % 500 mL IVPB (0 mg Intravenous Stopped 10/27/18 1042)  sodium chloride 0.9 % bolus 1,000 mL (0 mLs Intravenous Stopped 10/27/18 1042)  0.9 %  sodium chloride infusion (100 mL/hr Intravenous Rate/Dose Verify 10/27/18 2034)    Mobility walks

## 2018-10-27 NOTE — ED Notes (Signed)
Spoke with Liborio NixonJanice, RN- bed not ready at this time- will keep updated on bed status

## 2018-10-27 NOTE — ED Notes (Signed)
Date and time results received: 10/27/18 1212 (use smartphrase ".now" to insert current time)  Test: Po2  Critical Value: <32  Name of Provider Notified: Gwenlyn Perking MD  Orders Received? Or Actions Taken?: n/a

## 2018-10-27 NOTE — ED Notes (Signed)
Any patient updates or if patient is admitted upstairs, please call his wife Nahum Biagini [phone]

## 2018-10-28 DIAGNOSIS — E782 Mixed hyperlipidemia: Secondary | ICD-10-CM | POA: Diagnosis not present

## 2018-10-28 DIAGNOSIS — J101 Influenza due to other identified influenza virus with other respiratory manifestations: Secondary | ICD-10-CM | POA: Diagnosis present

## 2018-10-28 DIAGNOSIS — Z9981 Dependence on supplemental oxygen: Secondary | ICD-10-CM | POA: Diagnosis not present

## 2018-10-28 DIAGNOSIS — Z7951 Long term (current) use of inhaled steroids: Secondary | ICD-10-CM | POA: Diagnosis not present

## 2018-10-28 DIAGNOSIS — J441 Chronic obstructive pulmonary disease with (acute) exacerbation: Secondary | ICD-10-CM | POA: Diagnosis present

## 2018-10-28 DIAGNOSIS — N400 Enlarged prostate without lower urinary tract symptoms: Secondary | ICD-10-CM | POA: Diagnosis present

## 2018-10-28 DIAGNOSIS — K219 Gastro-esophageal reflux disease without esophagitis: Secondary | ICD-10-CM | POA: Diagnosis present

## 2018-10-28 DIAGNOSIS — Z7984 Long term (current) use of oral hypoglycemic drugs: Secondary | ICD-10-CM | POA: Diagnosis not present

## 2018-10-28 DIAGNOSIS — Z87891 Personal history of nicotine dependence: Secondary | ICD-10-CM | POA: Diagnosis not present

## 2018-10-28 DIAGNOSIS — Z79899 Other long term (current) drug therapy: Secondary | ICD-10-CM | POA: Diagnosis not present

## 2018-10-28 DIAGNOSIS — J9601 Acute respiratory failure with hypoxia: Secondary | ICD-10-CM | POA: Diagnosis present

## 2018-10-28 DIAGNOSIS — F419 Anxiety disorder, unspecified: Secondary | ICD-10-CM | POA: Diagnosis present

## 2018-10-28 DIAGNOSIS — E1165 Type 2 diabetes mellitus with hyperglycemia: Secondary | ICD-10-CM | POA: Diagnosis present

## 2018-10-28 DIAGNOSIS — I10 Essential (primary) hypertension: Secondary | ICD-10-CM | POA: Diagnosis present

## 2018-10-28 DIAGNOSIS — J9621 Acute and chronic respiratory failure with hypoxia: Secondary | ICD-10-CM | POA: Diagnosis present

## 2018-10-28 DIAGNOSIS — E785 Hyperlipidemia, unspecified: Secondary | ICD-10-CM | POA: Diagnosis present

## 2018-10-28 LAB — CBG MONITORING, ED
Glucose-Capillary: 273 mg/dL — ABNORMAL HIGH (ref 70–99)
Glucose-Capillary: 217 mg/dL — ABNORMAL HIGH (ref 70–99)

## 2018-10-28 LAB — CBC
HCT: 35.3 % — ABNORMAL LOW (ref 39.0–52.0)
Hemoglobin: 10.8 g/dL — ABNORMAL LOW (ref 13.0–17.0)
MCH: 27.2 pg (ref 26.0–34.0)
MCHC: 30.6 g/dL (ref 30.0–36.0)
MCV: 88.9 fL (ref 80.0–100.0)
Platelets: 253 10*3/uL (ref 150–400)
RBC: 3.97 MIL/uL — ABNORMAL LOW (ref 4.22–5.81)
RDW: 14.2 % (ref 11.5–15.5)
WBC: 8 10*3/uL (ref 4.0–10.5)
nRBC: 0 % (ref 0.0–0.2)

## 2018-10-28 LAB — COMPREHENSIVE METABOLIC PANEL
ALBUMIN: 3.2 g/dL — AB (ref 3.5–5.0)
ALT: 23 U/L (ref 0–44)
AST: 28 U/L (ref 15–41)
Alkaline Phosphatase: 59 U/L (ref 38–126)
Anion gap: 9 (ref 5–15)
BUN: 17 mg/dL (ref 8–23)
CO2: 26 mmol/L (ref 22–32)
Calcium: 8.8 mg/dL — ABNORMAL LOW (ref 8.9–10.3)
Chloride: 98 mmol/L (ref 98–111)
Creatinine, Ser: 0.96 mg/dL (ref 0.61–1.24)
GFR calc Af Amer: >60 mL/min (ref >60)
GFR calc non Af Amer: >60 mL/min (ref >60)
Glucose, Bld: 293 mg/dL — ABNORMAL HIGH (ref 70–99)
POTASSIUM: 4.6 mmol/L (ref 3.5–5.1)
Sodium: 133 mmol/L — ABNORMAL LOW (ref 135–145)
Total Bilirubin: 0.7 mg/dL (ref 0.3–1.2)
Total Protein: 7 g/dL (ref 6.5–8.1)

## 2018-10-28 LAB — MRSA PCR SCREENING: MRSA by PCR: NEGATIVE

## 2018-10-28 LAB — GLUCOSE, CAPILLARY
Glucose-Capillary: 237 mg/dL — ABNORMAL HIGH (ref 70–99)
Glucose-Capillary: 315 mg/dL — ABNORMAL HIGH (ref 70–99)

## 2018-10-28 LAB — HEMOGLOBIN A1C
Hgb A1c MFr Bld: 9.2 % — ABNORMAL HIGH (ref 4.8–5.6)
Mean Plasma Glucose: 217.34 mg/dL

## 2018-10-28 LAB — HIV ANTIBODY (ROUTINE TESTING W REFLEX): HIV Screen 4th Generation wRfx: NONREACTIVE

## 2018-10-28 NOTE — Progress Notes (Signed)
PROGRESS NOTE    KHAMAR STANDARD  FMB:[id number] DOB: [birth year] DOA: 10/27/2018 PCP: Gareth Morgan, MD     Brief Narrative:  63 y.o. male with PMH of anxiety, asthma, chronic resp failure due to COPD (wears 2 L of nasal cannula at baseline), BPH, DM type 2 and hypertension who came to the ED due to shortness of breath that started 2 days ago and has been progressively worsening. Reports cough with brownish thick mucus and complains of nausea without vomiting. Patient also expressed sore throat and abdominal pain that he associates with persistent coughing.  Denies fevers, headaches, chest pain, back pain, dysuria or hematuria, Denies changes on bowel movements, hematochezia or melena.   Of note; states his brother and brother's girlfriend have been sick with the flu.  In the ED patient was found to be hypoxic, with diffuse expiratory wheezing and requiring 6 L of oxygen supplementation.  Labored breathing continues to the point of requiring BiPAP placement to stabilize his breathing.  Patient was found to be positive for influenza A.  He received steroids, nebulizer treatment, antibiotics and Tamiflu.  TRH has been contacted to place patient in the hospital for further evaluation and management  Assessment & Plan: 1-respiratory failure, acute and chronic (HCC) -In the setting of influenza A and COPD exacerbation Bronchiectasis -Continue treatment with Tamiflu, nebulizer management, IV steroids, antibiotics (using doxycycline), continue the use of Pulmicort/Brovana and Mucinex. -Patient off BiPAP at this time and requiring 6 L nasal cannula supplementation; continue to wean down supplementation as tolerated. -Advised to maintain adequate hydration and nutrition. -Continue to follow culture results and use as needed analgesics, antiemetics and antipyretics.  2-HTN (hypertension) -Stable and well-controlled -Continue the use of alpha blockers  3-HLD (hyperlipidemia) -Continue  Crestor  4-GERD (gastroesophageal reflux disease) -Continue PPI  5-BPH -No symptoms of urinary retention -Continue the use of alfuzosin and finasteride.  6-Uncontrolled type 2 diabetes mellitus with hyperglycemia (HCC) -Continue holding oral hypoglycemic agents while inpatient -Continue to use a sliding scale insulin -Anticipate elevated CBGs while using steroids; will follow trend and adjust treatment as needed. -A1C 9.2  7-anxiety -Continue as needed alprazolam.  8-asthma/allergy rhinitis -Continue the use of montelukast -Continue fluticasone and Claritin -Acute treatment for acute on chronic respiratory failure with ongoing wheezing and COPD exacerbation as mentioned above.   DVT prophylaxis: Heparin Code Status: Full code Family Communication: No family at bedside. Disposition Plan: Remains in a stepdown, continue weaning oxygen supplementation as tolerated, continue treatment with Tamiflu, IV steroids, nebulizer therapy and antitussives regimen.  Continue supportive care and as needed antipyretics.  Consultants:   None  Procedures:   See below for x-ray reports.  Antimicrobials:  Anti-infectives (From admission, onward)   Start     Dose/Rate Route Frequency Ordered Stop   10/27/18 1330  doxycycline (VIBRA-TABS) tablet 100 mg     100 mg Oral Every 12 hours 10/27/18 1326 11/01/18 0959   10/27/18 1330  oseltamivir (TAMIFLU) capsule 75 mg  Status:  Discontinued     75 mg Oral 2 times daily 10/27/18 1326 10/28/18 1414   10/27/18 1300  vancomycin (VANCOCIN) IVPB 750 mg/150 ml premix  Status:  Discontinued     750 mg 150 mL/hr over 60 Minutes Intravenous Every 12 hours 10/27/18 1109 10/28/18 0748   10/27/18 1200  ceFEPIme (MAXIPIME) 2 g in sodium chloride 0.9 % 100 mL IVPB     2 g 200 mL/hr over 30 Minutes Intravenous Every 12 hours 10/27/18 1104  10/27/18 1000  oseltamivir (TAMIFLU) capsule 75 mg     75 mg Oral 2 times daily 10/27/18 0546 11/01/18 0959   10/27/18  0500  vancomycin (VANCOCIN) 1,500 mg in sodium chloride 0.9 % 500 mL IVPB     1,500 mg 250 mL/hr over 120 Minutes Intravenous  Once 10/27/18 0454 10/27/18 1042   10/27/18 0500  vancomycin (VANCOCIN) 1,500 mg in sodium chloride 0.9 % 500 mL IVPB  Status:  Discontinued     1,500 mg 250 mL/hr over 120 Minutes Intravenous  Once 10/27/18 0454 10/27/18 0454   10/27/18 0445  vancomycin (VANCOCIN) IVPB 1000 mg/200 mL premix  Status:  Discontinued     1,000 mg 200 mL/hr over 60 Minutes Intravenous  Once 10/27/18 0435 10/27/18 0454   10/27/18 0445  ceFEPIme (MAXIPIME) 2 g in sodium chloride 0.9 % 100 mL IVPB     2 g 200 mL/hr over 30 Minutes Intravenous  Once 10/27/18 0435 10/27/18 0609       Subjective: Unable to speak in full sentences, no chest pain, no nausea, no vomiting.  Mild use of accessory muscles.  6 L nasal cannula supplementation.  Objective: Vitals:   10/28/18 1200 10/28/18 1300 10/28/18 1340 10/28/18 1410  BP: 139/76 (!) 161/94  126/78  Pulse: (!) 107 (!) 103  (!) 103  Resp: 19 20  (!) 21  Temp:   98.5 F (36.9 C) 98 F (36.7 C)  TempSrc:   Oral   SpO2: 100% 98%  100%  Weight:    82.5 kg  Height:    6\' 2"  (1.88 m)    Intake/Output Summary (Last 24 hours) at 10/28/2018 1513 Last data filed at 10/28/2018 1340 Gross per 24 hour  Intake 2350 ml  Output 800 ml  Net 1550 ml   Filed Weights   10/27/18 0428 10/28/18 1410  Weight: 80.7 kg 82.5 kg    Examination: General exam: Alert, awake, oriented x 3; on 6 L high flow nasal cannula supplementation; having difficulty speaking in full sentences and expressing shortness of breath with minimal exertion. Respiratory system: Diffuse expiratory wheezing, positive rhonchi right, mild use of accessory muscles and positive tachypnea.   Cardiovascular system: Sinus tachycardia, no rubs, no gallops, no JVD, no murmurs.   Gastrointestinal system: Abdomen is nondistended, soft and nontender. No organomegaly or masses felt. Normal  bowel sounds heard. Central nervous system: Alert and oriented. No focal neurological deficits. Extremities: No C/C/E, +pedal pulses Skin: No rashes, lesions or ulcers Psychiatry: Judgement and insight appear normal. Mood & affect appropriate.    Data Reviewed: I have personally reviewed following labs and imaging studies  CBC: Recent Labs  Lab 10/27/18 0529 10/28/18 0437  WBC 11.7* 8.0  NEUTROABS 10.5*  --   HGB 12.3* 10.8*  HCT 41.1 35.3*  MCV 88.6 88.9  PLT 295 253   Basic Metabolic Panel: Recent Labs  Lab 10/27/18 0527 10/27/18 0529 10/28/18 0437  NA  --  131* 133*  K  --  4.6 4.6  CL  --  95* 98  CO2  --  27 26  GLUCOSE  --  169* 293*  BUN  --  13 17  CREATININE  --  1.10 0.96  CALCIUM  --  9.3 8.8*  MG 2.0  --   --   PHOS 3.3  --   --    GFR: Estimated Creatinine Clearance: 91.6 mL/min (by C-G formula based on SCr of 0.96 mg/dL).   Liver Function Tests: Recent Labs  Lab 10/28/18 0437  AST 28  ALT 23  ALKPHOS 59  BILITOT 0.7  PROT 7.0  ALBUMIN 3.2*   Cardiac Enzymes: Recent Labs  Lab 10/27/18 0529  TROPONINI 0.13*   HbA1C: Recent Labs    10/27/18 0529  HGBA1C 9.2*   CBG: Recent Labs  Lab 10/27/18 2138 10/28/18 0805 10/28/18 1204  GLUCAP 207* 273* 217*   Urine analysis:    Component Value Date/Time   COLORURINE YELLOW 10/27/2018 0458   APPEARANCEUR CLEAR 10/27/2018 0458   LABSPEC 1.018 10/27/2018 0458   PHURINE 5.0 10/27/2018 0458   GLUCOSEU NEGATIVE 10/27/2018 0458   HGBUR SMALL (A) 10/27/2018 0458   BILIRUBINUR NEGATIVE 10/27/2018 0458   KETONESUR NEGATIVE 10/27/2018 0458   PROTEINUR NEGATIVE 10/27/2018 0458   UROBILINOGEN 0.2 02/08/2008 1731   NITRITE NEGATIVE 10/27/2018 0458   LEUKOCYTESUR NEGATIVE 10/27/2018 0458    Recent Results (from the past 240 hour(s))  Respiratory Panel by PCR     Status: Abnormal   Collection Time: 10/27/18  4:58 AM  Result Value Ref Range Status   Adenovirus NOT DETECTED NOT DETECTED Final    Coronavirus 229E NOT DETECTED NOT DETECTED Final   Coronavirus HKU1 NOT DETECTED NOT DETECTED Final   Coronavirus NL63 NOT DETECTED NOT DETECTED Final   Coronavirus OC43 NOT DETECTED NOT DETECTED Final   Metapneumovirus NOT DETECTED NOT DETECTED Final   Rhinovirus / Enterovirus NOT DETECTED NOT DETECTED Final   Influenza A H1 2009 DETECTED (A) NOT DETECTED Final   Influenza B NOT DETECTED NOT DETECTED Final   Parainfluenza Virus 1 NOT DETECTED NOT DETECTED Final   Parainfluenza Virus 2 NOT DETECTED NOT DETECTED Final   Parainfluenza Virus 3 NOT DETECTED NOT DETECTED Final   Parainfluenza Virus 4 NOT DETECTED NOT DETECTED Final   Respiratory Syncytial Virus NOT DETECTED NOT DETECTED Final   Bordetella pertussis NOT DETECTED NOT DETECTED Final   Chlamydophila pneumoniae NOT DETECTED NOT DETECTED Final   Mycoplasma pneumoniae NOT DETECTED NOT DETECTED Final    Comment: Performed at [facility], [address] [place], [place] [address]  Blood Culture (routine x 2)     Status: None (Preliminary result)   Collection Time: 10/27/18  5:29 AM  Result Value Ref Range Status   Specimen Description BLOOD LEFT ARM  Final   Special Requests   Final    BOTTLES DRAWN AEROBIC AND ANAEROBIC Blood Culture adequate volume   Culture   Final    NO GROWTH < 12 HOURS Performed at [facility], [address] [place], [place] [address]    Report Status PENDING  Incomplete  Blood Culture (routine x 2)     Status: None (Preliminary result)   Collection Time: 10/27/18  5:38 AM  Result Value Ref Range Status   Specimen Description BLOOD LEFT HAND  Final   Special Requests   Final    BOTTLES DRAWN AEROBIC ONLY Blood Culture results may not be optimal due to an inadequate volume of blood received in culture bottles   Culture   Final    NO GROWTH < 12 HOURS Performed at [facility], [address] [place], [place] [address]    Report Status PENDING  Incomplete     Radiology Studies: Dg Chest  Port 1 View  Result Date: 10/27/2018 CLINICAL DATA:  Respiratory distress.  History of COPD. EXAM: PORTABLE CHEST 1 VIEW COMPARISON:  Chest radiograph September 07, 2018. FINDINGS: Cardiomediastinal silhouette is normal. Interstitial prominence with confluent in lung bases,  increased from prior examination. No pleural effusion. No pneumothorax. Soft tissue planes and included osseous structures are nonacute. IMPRESSION: Increased bibasilar interstitial prominence seen with atypical infection or atelectasis. Emphysema (ICD10-J43.9). Electronically Signed   By: Awilda Metro M.D.   On: 10/27/2018 05:11    Scheduled Meds: . alfuzosin  10 mg Oral Q breakfast  . arformoterol  15 mcg Nebulization BID  . budesonide (PULMICORT) nebulizer solution  0.5 mg Nebulization BID  . dextromethorphan-guaiFENesin  1 tablet Oral BID  . doxycycline  100 mg Oral Q12H  . finasteride  5 mg Oral Daily  . fluticasone  2 spray Each Nare Daily  . heparin  5,000 Units Subcutaneous Q8H  . insulin aspart  0-15 Units Subcutaneous TID WC  . insulin aspart  0-5 Units Subcutaneous QHS  . ipratropium-albuterol  3 mL Nebulization Q4H  . loratadine  10 mg Oral Daily  . methylPREDNISolone (SOLU-MEDROL) injection  60 mg Intravenous Q8H  . montelukast  10 mg Oral q morning - 10a  . oseltamivir  75 mg Oral BID  . pantoprazole  40 mg Oral q morning - 10a  . rosuvastatin  40 mg Oral QHS  . sodium chloride flush  3 mL Intravenous Q12H   Continuous Infusions: . sodium chloride    . ceFEPime (MAXIPIME) IV Stopped (10/28/18 1246)     LOS: 0 days    Time spent: 35 minutes. Greater than 50% of this time was spent in direct contact with the patient, coordinating care and discussing relevant ongoing clinical issues, including need to transition him off BiPAP and then wean oxygen supplementation as tolerated.  We will continue the use of his steroids, antibiotics, Tamiflu and provide supportive care.  Still with mild use of accessory  muscles, demonstrating inability to speak in full sentences and requiring 6 L nasal cannula supplementation.  Patient will remain in stepdown.     Vassie Loll, MD [facility] Pager [phone]   10/28/2018, 3:13 PM

## 2018-10-28 NOTE — Progress Notes (Signed)
Patient taken off of BIPAP and placed on 6L HFNC. All vitals are stable and as follows: HR 101, RR 17, Sat 100%. Patient resting comfortably at this time. BIPAP at bedside on standby if needed.

## 2018-10-28 NOTE — ED Notes (Addendum)
Asher Muir, RT notified of pt c/o face irritation due to bipap- reports will attmept weaning off bipap to high-flow O2.  Pt notified of this plan and willing to attempt.

## 2018-10-28 NOTE — ED Notes (Signed)
Tiffany, [facility] notified for need of vancomycin 750mg  IVPB

## 2018-10-29 DIAGNOSIS — Z720 Tobacco use: Secondary | ICD-10-CM

## 2018-10-29 LAB — GLUCOSE, CAPILLARY
Glucose-Capillary: 298 mg/dL — ABNORMAL HIGH (ref 70–99)
Glucose-Capillary: 359 mg/dL — ABNORMAL HIGH (ref 70–99)
Glucose-Capillary: 278 mg/dL — ABNORMAL HIGH (ref 70–99)
Glucose-Capillary: 286 mg/dL — ABNORMAL HIGH (ref 70–99)

## 2018-10-29 MED ORDER — BUDESONIDE 0.25 MG/2ML IN SUSP
RESPIRATORY_TRACT | Status: AC
  Filled 2018-10-29: qty 4

## 2018-10-29 NOTE — Progress Notes (Signed)
PROGRESS NOTE    Jeff BRENTON  Ray:[id number] DOB: 1956-01-14 DOA: 10/27/2018 PCP: Gareth Morgan, MD     Brief Narrative:  63 y.o. male with PMH of anxiety, asthma, chronic resp failure due to COPD (wears 2 L of nasal cannula at baseline), BPH, DM type 2 and hypertension who came to the ED due to shortness of breath that started 2 days ago and has been progressively worsening. Reports cough with brownish thick mucus and complains of nausea without vomiting. Patient also expressed sore throat and abdominal pain that he associates with persistent coughing.  Denies fevers, headaches, chest pain, back pain, dysuria or hematuria, Denies changes on bowel movements, hematochezia or melena.   Of note; states his brother and brother's girlfriend have been sick with the flu.  In the ED patient was found to be hypoxic, with diffuse expiratory wheezing and requiring 6 L of oxygen supplementation.  Labored breathing continues to the point of requiring BiPAP placement to stabilize his breathing.  Patient was found to be positive for influenza A.  He received steroids, nebulizer treatment, antibiotics and Tamiflu.  TRH has been contacted to place patient in the hospital for further evaluation and management  Assessment & Plan: 1-respiratory failure, acute and chronic (HCC) -In the setting of influenza A and COPD exacerbation Bronchiectasis -Continue treatment with Tamiflu, nebulizer management, IV steroids, antibiotics (using doxycycline), continue the use of Pulmicort/Brovana and Mucinex. -Patient off BiPAP at this time and requiring 5 L nasal cannula supplementation; continue to wean down supplementation as tolerated. -Advised to maintain adequate hydration and nutrition. -Continue to follow culture results and use as needed analgesics, antiemetics and antipyretics.  2-HTN (hypertension) -Stable and well-controlled -Continue the use of alpha blockers  3-HLD (hyperlipidemia) -Continue  Crestor  4-GERD (gastroesophageal reflux disease) -Continue PPI  5-BPH -No symptoms of urinary retention -Continue the use of alfuzosin and finasteride.  6-Uncontrolled type 2 diabetes mellitus with hyperglycemia (HCC) -Continue holding oral hypoglycemic agents while inpatient -Continue to use a sliding scale insulin -Anticipate elevated CBGs while using steroids; will follow trend and adjust treatment as needed. -A1C 9.2  7-anxiety -Continue as needed alprazolam.  8-asthma/allergic rhinitis -Continue the use of montelukast -Continue fluticasone and Claritin -Acute treatment for acute on chronic respiratory failure with ongoing wheezing and COPD exacerbation as mentioned above.   DVT prophylaxis: Heparin Code Status: Full code Family Communication: No family at bedside. Disposition Plan: We will transfer to MedSurg; continue weaning oxygen supplementation as tolerated, continue treatment with Tamiflu, steroids, antibiotics, and nebulizer therapy.   Consultants:   None  Procedures:   See below for x-ray reports.  Antimicrobials:  Anti-infectives (From admission, onward)   Start     Dose/Rate Route Frequency Ordered Stop   10/27/18 1330  doxycycline (VIBRA-TABS) tablet 100 mg     100 mg Oral Every 12 hours 10/27/18 1326 11/01/18 0959   10/27/18 1330  oseltamivir (TAMIFLU) capsule 75 mg  Status:  Discontinued     75 mg Oral 2 times daily 10/27/18 1326 10/28/18 1414   10/27/18 1300  vancomycin (VANCOCIN) IVPB 750 mg/150 ml premix  Status:  Discontinued     750 mg 150 mL/hr over 60 Minutes Intravenous Every 12 hours 10/27/18 1109 10/28/18 0748   10/27/18 1200  ceFEPIme (MAXIPIME) 2 g in sodium chloride 0.9 % 100 mL IVPB     2 g 200 mL/hr over 30 Minutes Intravenous Every 12 hours 10/27/18 1104     10/27/18 1000  oseltamivir (TAMIFLU) capsule 75 mg  75 mg Oral 2 times daily 10/27/18 0546 11/01/18 0959   10/27/18 0500  vancomycin (VANCOCIN) 1,500 mg in sodium chloride  0.9 % 500 mL IVPB     1,500 mg 250 mL/hr over 120 Minutes Intravenous  Once 10/27/18 0454 10/27/18 1042   10/27/18 0500  vancomycin (VANCOCIN) 1,500 mg in sodium chloride 0.9 % 500 mL IVPB  Status:  Discontinued     1,500 mg 250 mL/hr over 120 Minutes Intravenous  Once 10/27/18 0454 10/27/18 0454   10/27/18 0445  vancomycin (VANCOCIN) IVPB 1000 mg/200 mL premix  Status:  Discontinued     1,000 mg 200 mL/hr over 60 Minutes Intravenous  Once 10/27/18 0435 10/27/18 0454   10/27/18 0445  ceFEPIme (MAXIPIME) 2 g in sodium chloride 0.9 % 100 mL IVPB     2 g 200 mL/hr over 30 Minutes Intravenous  Once 10/27/18 0435 10/27/18 0609       Subjective: Still having mild difficulty speaking in full sentences, diffuse expiratory wheezing, and low-grade temperature overnight.  No chest pain, no nausea, no vomiting.  Objective: Vitals:   10/29/18 1100 10/29/18 1155 10/29/18 1200 10/29/18 1417  BP: (!) 153/66     Pulse: (!) 119  (!) 104   Resp: (!) 22  15   Temp:  97.8 F (36.6 C)    TempSrc:  Oral    SpO2: 100%  97% 99%  Weight:      Height:        Intake/Output Summary (Last 24 hours) at 10/29/2018 1547 Last data filed at 10/29/2018 1200 Gross per 24 hour  Intake 1340.67 ml  Output 1000 ml  Net 340.67 ml   Filed Weights   10/27/18 0428 10/28/18 1410 10/29/18 0404  Weight: 80.7 kg 82.5 kg 83.5 kg    Examination: General exam: Alert, awake, oriented x 3; currently using 5 L nasal cannula supplementation; still with shortness of breath on minimal exertion and experiencing diffuse wheezing. Respiratory system: Diffuse expiratory wheezing, positive rhonchi, no using accessory muscles.   Cardiovascular system: Mild sinus tachycardia, no rubs, no gallops, no JVD, no murmurs on exam.   Gastrointestinal system: Abdomen is nondistended, soft and nontender. No organomegaly or masses felt. Normal bowel sounds heard. Central nervous system: Alert and oriented. No focal neurological  deficits. Extremities: No C/C/E, +pedal pulses Skin: No rashes, lesions or ulcers Psychiatry: Judgement and insight appear normal. Mood & affect appropriate.    Data Reviewed: I have personally reviewed following labs and imaging studies  CBC: Recent Labs  Lab 10/27/18 0529 10/28/18 0437  WBC 11.7* 8.0  NEUTROABS 10.5*  --   HGB 12.3* 10.8*  HCT 41.1 35.3*  MCV 88.6 88.9  PLT 295 253   Basic Metabolic Panel: Recent Labs  Lab 10/27/18 0527 10/27/18 0529 10/28/18 0437  NA  --  131* 133*  K  --  4.6 4.6  CL  --  95* 98  CO2  --  27 26  GLUCOSE  --  169* 293*  BUN  --  13 17  CREATININE  --  1.10 0.96  CALCIUM  --  9.3 8.8*  MG 2.0  --   --   PHOS 3.3  --   --    GFR: Estimated Creatinine Clearance: 91.6 mL/min (by C-G formula based on SCr of 0.96 mg/dL).   Liver Function Tests: Recent Labs  Lab 10/28/18 0437  AST 28  ALT 23  ALKPHOS 59  BILITOT 0.7  PROT 7.0  ALBUMIN 3.2*  Cardiac Enzymes: Recent Labs  Lab 10/27/18 0529  TROPONINI 0.13*   HbA1C: Recent Labs    10/27/18 0529  HGBA1C 9.2*   CBG: Recent Labs  Lab 10/28/18 1204 10/28/18 1627 10/28/18 2115 10/29/18 0757 10/29/18 1157  GLUCAP 217* 237* 315* 298* 359*   Urine analysis:    Component Value Date/Time   COLORURINE YELLOW 10/27/2018 0458   APPEARANCEUR CLEAR 10/27/2018 0458   LABSPEC 1.018 10/27/2018 0458   PHURINE 5.0 10/27/2018 0458   GLUCOSEU NEGATIVE 10/27/2018 0458   HGBUR SMALL (A) 10/27/2018 0458   BILIRUBINUR NEGATIVE 10/27/2018 0458   KETONESUR NEGATIVE 10/27/2018 0458   PROTEINUR NEGATIVE 10/27/2018 0458   UROBILINOGEN 0.2 02/08/2008 1731   NITRITE NEGATIVE 10/27/2018 0458   LEUKOCYTESUR NEGATIVE 10/27/2018 0458    Recent Results (from the past 240 hour(s))  Respiratory Panel by PCR     Status: Abnormal   Collection Time: 10/27/18  4:58 AM  Result Value Ref Range Status   Adenovirus NOT DETECTED NOT DETECTED Final   Coronavirus 229E NOT DETECTED NOT DETECTED  Final   Coronavirus HKU1 NOT DETECTED NOT DETECTED Final   Coronavirus NL63 NOT DETECTED NOT DETECTED Final   Coronavirus OC43 NOT DETECTED NOT DETECTED Final   Metapneumovirus NOT DETECTED NOT DETECTED Final   Rhinovirus / Enterovirus NOT DETECTED NOT DETECTED Final   Influenza A H1 2009 DETECTED (A) NOT DETECTED Final   Influenza B NOT DETECTED NOT DETECTED Final   Parainfluenza Virus 1 NOT DETECTED NOT DETECTED Final   Parainfluenza Virus 2 NOT DETECTED NOT DETECTED Final   Parainfluenza Virus 3 NOT DETECTED NOT DETECTED Final   Parainfluenza Virus 4 NOT DETECTED NOT DETECTED Final   Respiratory Syncytial Virus NOT DETECTED NOT DETECTED Final   Bordetella pertussis NOT DETECTED NOT DETECTED Final   Chlamydophila pneumoniae NOT DETECTED NOT DETECTED Final   Mycoplasma pneumoniae NOT DETECTED NOT DETECTED Final    Comment: Performed at [facility], [address] [place], [place] [address]  Blood Culture (routine x 2)     Status: None (Preliminary result)   Collection Time: 10/27/18  5:29 AM  Result Value Ref Range Status   Specimen Description BLOOD LEFT ARM  Final   Special Requests   Final    BOTTLES DRAWN AEROBIC AND ANAEROBIC Blood Culture adequate volume   Culture   Final    NO GROWTH 2 DAYS Performed at [facility], [address] [place], [place] [address]    Report Status PENDING  Incomplete  Blood Culture (routine x 2)     Status: None (Preliminary result)   Collection Time: 10/27/18  5:38 AM  Result Value Ref Range Status   Specimen Description BLOOD LEFT HAND  Final   Special Requests   Final    BOTTLES DRAWN AEROBIC ONLY Blood Culture results may not be optimal due to an inadequate volume of blood received in culture bottles   Culture   Final    NO GROWTH 2 DAYS Performed at [facility], [address] [place], [place] [address]    Report Status PENDING  Incomplete  MRSA PCR Screening     Status: None   Collection Time: 10/28/18  2:17 PM  Result Value  Ref Range Status   MRSA by PCR NEGATIVE NEGATIVE Final    Comment:        The GeneXpert MRSA Assay (FDA approved for NASAL specimens only), is one component of a comprehensive MRSA colonization surveillance program. It is not intended  to diagnose MRSA infection nor to guide or monitor treatment for MRSA infections. Performed at [facility], [address] [place], [place] [address]      Radiology Studies: No results found.  Scheduled Meds: . alfuzosin  10 mg Oral Q breakfast  . arformoterol  15 mcg Nebulization BID  . budesonide      . budesonide (PULMICORT) nebulizer solution  0.5 mg Nebulization BID  . dextromethorphan-guaiFENesin  1 tablet Oral BID  . doxycycline  100 mg Oral Q12H  . finasteride  5 mg Oral Daily  . fluticasone  2 spray Each Nare Daily  . heparin  5,000 Units Subcutaneous Q8H  . insulin aspart  0-15 Units Subcutaneous TID WC  . insulin aspart  0-5 Units Subcutaneous QHS  . ipratropium-albuterol  3 mL Nebulization Q4H  . loratadine  10 mg Oral Daily  . methylPREDNISolone (SOLU-MEDROL) injection  60 mg Intravenous Q8H  . montelukast  10 mg Oral q morning - 10a  . oseltamivir  75 mg Oral BID  . pantoprazole  40 mg Oral q morning - 10a  . rosuvastatin  40 mg Oral QHS  . sodium chloride flush  3 mL Intravenous Q12H   Continuous Infusions: . sodium chloride 10 mL/hr at 10/29/18 0636  . ceFEPime (MAXIPIME) IV 2 g (10/29/18 1118)     LOS: 1 day    Time spent: 30 minutes    Vassie Lollarlos Mishon Blubaugh, MD [facility] Pager [phone]   10/29/2018, 3:47 PM

## 2018-10-30 LAB — GLUCOSE, CAPILLARY
Glucose-Capillary: 344 mg/dL — ABNORMAL HIGH (ref 70–99)
Glucose-Capillary: 327 mg/dL — ABNORMAL HIGH (ref 70–99)

## 2018-10-30 MED ORDER — PRO-STAT SUGAR FREE PO LIQD: 30.0000 mL | Freq: Two times a day (BID) | ORAL | Status: DC

## 2018-10-30 MED ORDER — PREDNISONE 20 MG PO TABS: ORAL_TABLET | ORAL | 0 refills | Status: DC

## 2018-10-30 MED ORDER — OSELTAMIVIR PHOSPHATE 75 MG PO CAPS: 75.0000 mg | ORAL_CAPSULE | Freq: Two times a day (BID) | ORAL | 0 refills | Status: AC

## 2018-10-30 MED ORDER — GUAIFENESIN-DM 100-10 MG/5ML PO SYRP
5.0000 mL | ORAL_SOLUTION | ORAL | Status: DC | PRN
  Administered 2018-10-30 (×2): 5 mL via ORAL
  Filled 2018-10-30 (×2): qty 5

## 2018-10-30 MED ORDER — DOXYCYCLINE HYCLATE 100 MG PO TABS: 100.0000 mg | ORAL_TABLET | Freq: Two times a day (BID) | ORAL | 0 refills | Status: AC

## 2018-10-30 NOTE — Progress Notes (Addendum)
Patient eating at 0834, nebulizer treatment postponed.

## 2018-10-30 NOTE — Discharge Summary (Signed)
Physician Discharge Summary  Jeff Ray UJW:[id number] DOB: 1956-08-07 DOA: 10/27/2018  PCP: Gareth Morgan, MD  Admit date: 10/27/2018 Discharge date: 10/30/2018  Time spent: 35 minutes  Recommendations for Outpatient Follow-up:  1. Repeat basic metabolic panel to follow electrolytes and renal function 2. Reassess blood pressure and adjust antihypertensive regimen as needed.   Discharge Diagnoses:  Principal Problem:   Respiratory failure, acute and chronic (HCC) Active Problems:   HTN (hypertension)   Tobacco abuse   HLD (hyperlipidemia)   GERD (gastroesophageal reflux disease)   BPH (benign prostatic hyperplasia)   Uncontrolled type 2 diabetes mellitus with hyperglycemia (HCC)   COPD with acute exacerbation (HCC)   Influenza A   Acute respiratory failure with hypoxia (HCC)  CODE STATUS: Full code  Discharge Condition: Stable and improved.  Discharged home with instructions to follow-up with PCP in 10 days.  Diet recommendation: Heart healthy modified carbohydrates  Filed Weights   10/27/18 0428 10/28/18 1410 10/29/18 0404  Weight: 80.7 kg 82.5 kg 83.5 kg    History of present illness:  63 y.o. male with PMH of anxiety, asthma, chronic resp failure due to COPD (wears 2 L of nasal cannula at baseline), BPH, DM type 2 and hypertension who came to the ED due to shortness of breath that started 2 days ago and has been progressively worsening. Reports cough with brownish thick mucus and complains of nausea without vomiting. Patient also expressed sore throat and abdominal pain that he associates with persistent coughing.  Denies fevers, headaches, chest pain, back pain, dysuria or hematuria, Denies changes on bowel movements, hematochezia or melena.   Of note; states his brother and brother's girlfriend have been sick with the flu.  In the ED patient was found to be hypoxic, with diffuse expiratory wheezing and requiring 6 L of oxygen supplementation.  Labored breathing  continues to the point of requiring BiPAP placement to stabilize his breathing.  Patient was found to be positive for influenza A.  He received steroids, nebulizer treatment, antibiotics and Tamiflu.  TR[facility] has been contacted to place patient in the hospital for further evaluation and management.   Hospital Course:  1-respiratory failure, acute on chronic -In the setting of influenza A infection and COPD exacerbation/bronchiectasis. Significant improvement with treatment with Tamiflu, nebulizer management, steroids and antibiotics. -Patient was discharged home Tamiflu therapy and oral antibiotics; he will follow steroids tapering using prednisone I will keep himself well-hydrated. -Outpatient follow-up with PCP in 10 days has been recommended. -We will resume home rescue and maintenance inhaler/nebulizer.  2-hypertension -Stable and well-controlled -Advised to follow heart healthy diet -Continue the use of alpha blockers.  3-hyperlipidemia -Continue the use of Crestor.  4-gastroesophageal reflux disease -Continue PPI  5-BPH -No symptoms of urinary retention -Continue the use of alfuzosin and finasteride.  6-uncontrolled type II -With hyperglycemia-most likely in the setting of his steroids use -Resume home hypoglycemic agent and patient has been advised to follow a low-calorie diet. A1C 9.2  7-anxiety -Mood is stable and well-controlled -Continue as needed alprazolam.  8-Asthma/allergy rhinitis -Continue the use of montelukast, fluticasone and Claritin. -Continue treatment for acute on chronic respiratory failure with ongoing            Procedures:  See below for x-ray reports.  Consultations:  None  Discharge Exam: Vitals:   10/30/18 0900 10/30/18 1322  BP: (!) 162/87   Pulse: (!) 105   Resp: 20   Temp:    SpO2: 96% 97%    General: Afebrile, able  to speak in full sentences, no nausea, no vomiting, no chest pain.  Reports improved breathing  overall. Cardiovascular: S1 and S2, no rubs, no gallops; intermittent episode of sinus tachycardia. Respiratory: Positive rhonchi, very little expiratory wheezing on exam; improved air movement bilaterally. Abdomen: Soft, nontender, nondistended, positive bowel sounds Extremities: No cyanosis, no clubbing.  Discharge Instructions   Discharge Instructions    Diet - low sodium heart healthy   Complete by:  As directed    Discharge instructions   Complete by:  As directed    Take medications as prescribed Keep yourself well-hydrated Arrange follow-up with PCP in 10 days Follow heart healthy modify carbohydrates diet.   Increase activity slowly   Complete by:  As directed      Allergies as of 10/30/2018   No Known Allergies     Medication List    STOP taking these medications   levofloxacin 500 MG tablet Commonly known as:  LEVAQUIN   neomycin-polymyxin-hydrocortisone OTIC solution Commonly known as:  CORTISPORIN     TAKE these medications   alfuzosin 10 MG 24 hr tablet Commonly known as:  UROXATRAL Take 10 mg by mouth at bedtime.   ALPRAZolam 1 MG tablet Commonly known as:  XANAX Take 1 mg by mouth 3 (three) times daily as needed for anxiety.   ANORO ELLIPTA 62.5-25 MCG/INH Aepb Generic drug:  umeclidinium-vilanterol Take 1 puff by mouth daily.   CENTRUM SILVER 50+MEN PO Take 1 tablet by mouth daily.   doxycycline 100 MG tablet Commonly known as:  VIBRA-TABS Take 1 tablet (100 mg total) by mouth every 12 (twelve) hours for 5 days.   fexofenadine-pseudoephedrine 180-240 MG 24 hr tablet Commonly known as:  ALLEGRA-D 24 Take 1 tablet by mouth daily.   finasteride 5 MG tablet Commonly known as:  PROSCAR Take 5 mg by mouth daily.   fluticasone 50 MCG/ACT nasal spray Commonly known as:  FLONASE Place 2 sprays into both nostrils daily.   glimepiride 2 MG tablet Commonly known as:  AMARYL Take 2 mg by mouth daily with breakfast.   ibuprofen 800 MG  tablet Commonly known as:  ADVIL,MOTRIN Take 800 mg by mouth every 8 (eight) hours as needed for mild pain or moderate pain.   ipratropium-albuterol 0.5-2.5 (3) MG/3ML Soln Commonly known as:  DUONEB Take 3 mLs by nebulization every 4 (four) hours as needed. For shortness of breath   metFORMIN 500 MG 24 hr tablet Commonly known as:  GLUCOPHAGE-XR Take 500 mg by mouth 2 (two) times daily.   montelukast 10 MG tablet Commonly known as:  SINGULAIR Take 10 mg by mouth every morning.   MUCINEX MAXIMUM STRENGTH 1200 MG Tb12 Generic drug:  Guaifenesin Take 1 tablet by mouth 2 (two) times daily.   oseltamivir 75 MG capsule Commonly known as:  TAMIFLU Take 1 capsule (75 mg total) by mouth 2 (two) times daily for 2 days.   oxybutynin 5 MG tablet Commonly known as:  DITROPAN Take 5 mg by mouth 3 (three) times daily.   pantoprazole 40 MG tablet Commonly known as:  PROTONIX Take 40 mg by mouth every morning.   predniSONE 20 MG tablet Commonly known as:  DELTASONE Take 3 tablets by mouth daily x1 day; then 2 tablet by mouth daily x2 days; then 1 tablet by mouth daily x3 days; then half tablet by mouth daily x3 days and stop prednisone.   PROAIR HFA 108 (90 Base) MCG/ACT inhaler Generic drug:  albuterol Inhale 2 puffs into the  lungs every 6 (six) hours as needed for wheezing or shortness of breath. Shortness of Breath   rosuvastatin 40 MG tablet Commonly known as:  CRESTOR Take 40 mg by mouth at bedtime.   triamcinolone cream 0.5 % Commonly known as:  KENALOG Apply 1 application topically 3 (three) times daily. Applied to hands      No Known Allergies Follow-up Information    [name], [name], MD. Schedule an appointment as soon as possible for a visit in 10 day(s).   Specialty:  Family Medicine Contact information: [address] [place] [address] [phone]           The results of significant diagnostics from this hospitalization (including imaging,  microbiology, ancillary and laboratory) are listed below for reference.    Significant Diagnostic Studies: Dg Chest Port 1 View  Result Date: 10/27/2018 CLINICAL DATA:  Respiratory distress.  History of COPD. EXAM: PORTABLE CHEST 1 VIEW COMPARISON:  Chest radiograph September 07, 2018. FINDINGS: Cardiomediastinal silhouette is normal. Interstitial prominence with confluent in lung bases, increased from prior examination. No pleural effusion. No pneumothorax. Soft tissue planes and included osseous structures are nonacute. IMPRESSION: Increased bibasilar interstitial prominence seen with atypical infection or atelectasis. Emphysema (ICD10-J43.9). Electronically Signed   By: Awilda Metroourtnay  Bloomer M.D.   On: 10/27/2018 05:11    Microbiology: Recent Results (from the past 240 hour(s))  Respiratory Panel by PCR     Status: Abnormal   Collection Time: 10/27/18  4:58 AM  Result Value Ref Range Status   Adenovirus NOT DETECTED NOT DETECTED Final   Coronavirus 229E NOT DETECTED NOT DETECTED Final   Coronavirus HKU1 NOT DETECTED NOT DETECTED Final   Coronavirus NL63 NOT DETECTED NOT DETECTED Final   Coronavirus OC43 NOT DETECTED NOT DETECTED Final   Metapneumovirus NOT DETECTED NOT DETECTED Final   Rhinovirus / Enterovirus NOT DETECTED NOT DETECTED Final   Influenza A H1 2009 DETECTED (A) NOT DETECTED Final   Influenza B NOT DETECTED NOT DETECTED Final   Parainfluenza Virus 1 NOT DETECTED NOT DETECTED Final   Parainfluenza Virus 2 NOT DETECTED NOT DETECTED Final   Parainfluenza Virus 3 NOT DETECTED NOT DETECTED Final   Parainfluenza Virus 4 NOT DETECTED NOT DETECTED Final   Respiratory Syncytial Virus NOT DETECTED NOT DETECTED Final   Bordetella pertussis NOT DETECTED NOT DETECTED Final   Chlamydophila pneumoniae NOT DETECTED NOT DETECTED Final   Mycoplasma pneumoniae NOT DETECTED NOT DETECTED Final    Comment: Performed at [facility], [address] [place], [place] [address]  Blood Culture  (routine x 2)     Status: None (Preliminary result)   Collection Time: 10/27/18  5:29 AM  Result Value Ref Range Status   Specimen Description BLOOD LEFT ARM  Final   Special Requests   Final    BOTTLES DRAWN AEROBIC AND ANAEROBIC Blood Culture adequate volume   Culture   Final    NO GROWTH 3 DAYS Performed at  [facility], [address] [place], [place] [address]    Report Status PENDING  Incomplete  Blood Culture (routine x 2)     Status: None (Preliminary result)   Collection Time: 10/27/18  5:38 AM  Result Value Ref Range Status   Specimen Description BLOOD LEFT HAND  Final   Special Requests   Final    BOTTLES DRAWN AEROBIC ONLY Blood Culture results may not be optimal due to an inadequate volume of blood received in culture bottles   Culture   Final  NO GROWTH 3 DAYS Performed at [facility], [address] [place], [place] [address]    Report Status PENDING  Incomplete  MRSA PCR Screening     Status: None   Collection Time: 10/28/18  2:17 PM  Result Value Ref Range Status   MRSA by PCR NEGATIVE NEGATIVE Final    Comment:        The GeneXpert MRSA Assay (FDA approved for NASAL specimens only), is one component of a comprehensive MRSA colonization surveillance program. It is not intended to diagnose MRSA infection nor to guide or monitor treatment for MRSA infections. Performed at [facility], [address] [place], [place] [address]      Labs: Basic Metabolic Panel: Recent Labs  Lab 10/27/18 0527 10/27/18 0529 10/28/18 0437  NA  --  131* 133*  K  --  4.6 4.6  CL  --  95* 98  CO2  --  27 26  GLUCOSE  --  169* 293*  BUN  --  13 17  CREATININE  --  1.10 0.96  CALCIUM  --  9.3 8.8*  MG 2.0  --   --   PHOS 3.3  --   --    Liver Function Tests: Recent Labs  Lab 10/28/18 0437  AST 28  ALT 23  ALKPHOS 59  BILITOT 0.7  PROT 7.0  ALBUMIN 3.2*   CBC: Recent Labs  Lab 10/27/18 0529 10/28/18 0437  WBC 11.7* 8.0  NEUTROABS 10.5*  --   HGB  12.3* 10.8*  HCT 41.1 35.3*  MCV 88.6 88.9  PLT 295 253   Cardiac Enzymes: Recent Labs  Lab 10/27/18 0529  TROPONINI 0.13*   BNP: BNP (last 3 results) Recent Labs    09/07/18 1819 10/27/18 0529  BNP 28.0 31.0   CBG: Recent Labs  Lab 10/29/18 1157 10/29/18 1704 10/29/18 2125 10/30/18 0818 10/30/18 1055  GLUCAP 359* 278* 286* 344* 327*    Signed:  Vassie Lollarlos Declyn Delsol MD.  [facility] 10/30/2018, 1:34 PM

## 2018-10-30 NOTE — Progress Notes (Signed)
Inpatient Diabetes Program Recommendations  AACE/ADA: New Consensus Statement on Inpatient Glycemic Control (2015)  Target Ranges:  Prepandial:   less than 140 mg/dL      Peak postprandial:   less than 180 mg/dL (1-2 hours)      Critically ill patients:  140 - 180 mg/dL   Lab Results  Component Value Date   GLUCAP 327 (H) 10/30/2018   HGBA1C 9.2 (H) 10/27/2018   Results for SAMBO, BASSANO (MRN [id number]) as of 10/30/2018 13:20  Ref. Range 09/08/2018 03:32 10/27/2018 05:29  Hemoglobin A1C Latest Ref Range: 4.8 - 5.6 % 7.5 (H) 9.2 (H)   Review of Glycemic Control Results for DEANDREW, SEANEY (MRN [id number]) as of 10/30/2018 13:20  Ref. Range 10/29/2018 11:57 10/29/2018 17:04 10/29/2018 21:25 10/30/2018 08:18 10/30/2018 10:55  Glucose-Capillary Latest Ref Range: 70 - 99 mg/dL 078 (H) 675 (H) 449 (H) 344 (H) 327 (H)   Diabetes history: DM 2 Outpatient Diabetes medications:  Amaryl 2 mg daily, Metformin 500 mg bid Current orders for Inpatient glycemic control:  Novolog moderate tid with meals and HS, Solumedrol 60 mg IV q 8 hours  Inpatient Diabetes Program Recommendations:    Blood sugars>goal.  While on steroids, please add Lantus 16 units daily and Novolog 4 units tid with meals.    Thanks,  Beryl Meager, RN, BC-ADM Inpatient Diabetes Coordinator Pager [phone] (8a-5p)

## 2018-10-30 NOTE — Progress Notes (Signed)
Patient given discharge instructions and verbalized understanding. Med scripts sent home with patient. 2 peripheral IVs removed. Patient discharged to home with wife.

## 2018-10-30 NOTE — Progress Notes (Signed)
PT Cancellation Note  Patient Details Name: Jeff Ray MRN: [id number] DOB: 1956/06/17   Cancelled Treatment:    Reason Eval/Treat Not Completed: Patient declined, no reason specified. Patient reports he uses a SPC, electric wheelchair and oxygen at home and is at baseline functionally. Patient reports he is going home today, has been ambulating to toilet with nursing and refused physical therapy evaluation. Will check on patient tomorrow if still here.   1:54 PM, 10/30/18 Domenick Bookbinder, DPT Physical Therapist with [facility] [phone] office

## 2018-10-30 NOTE — Care Management Note (Signed)
Case Management Note  Patient Details  Name: Jeff Ray MRN: [id number] Date of Birth: [birth year]  Subjective/Objective:                  COPD and flu +. From home, Uses electric WC. Has oxygen and neb at home.   Action/Plan: DC home today, no CM needs known.  Expected Discharge Date:  10/30/18               Expected Discharge Plan:  Home/Self Care  In-House Referral:     Discharge planning Services  CM Consult  Post Acute Care Choice:  NA Choice offered to:  NA  DME Arranged:    DME Agency:     HH Arranged:    HH Agency:     Status of Service:  Completed, signed off  If discussed at [employer] of Stay Meetings, dates discussed:    Additional Comments:  Malaysha Arlen, Chrystine Oiler, RN 10/30/2018, 1:45 PM

## 2018-10-30 NOTE — Progress Notes (Signed)
Initial Nutrition Assessment  DOCUMENTATION CODES:   Not applicable  INTERVENTION:  Heart Healthy/CHO modified meals  ProStat 30 ml BID (each 30 ml provides 100 kcal, 15 gr protein)    NUTRITION DIAGNOSIS:   Increased nutrient needs related to acute illness, chronic illness(acute on chronic respiratory failure  and positive for influenza A) as evidenced by estimated needs.   GOAL:   Patient will meet greater than or equal to 90% of their needs   MONITOR:   PO intake, Weight trends, Labs, Supplement acceptance  REASON FOR ASSESSMENT:   Consult Assessment of nutrition requirement/status  ASSESSMENT: Patient is a 63 yo male with history of COPD (home O2 @ 2L), Asthma, Hypertension, Diabetes. Acute on chronic respiratory failure, positive influenza A. Off BiPAP on nasal canula.   Meal intake: excellent- 100% of all meals. He lives with wife who prepares food for them. Patient is retired but has multiple hobbies that keeps him busy through the day.Patient endorses regular meal intake at home daily and primarily drinks water with meals. Decreased oral intake immediately prior to admission.   Patient weights reviewed and reflect stable for 2 years between 84-85 kg  With the exception of what appears to be an outlier value of 80 kg. No overt findings of muscle or fat loss -upper body.  Medications reviewed and include: SSI, Prednisone, Tamiflu, Protonix  Labs: BMP Latest Ref Rng & Units 10/28/2018 10/27/2018 09/10/2018  Glucose 70 - 99 mg/dL 262(M) 355(H) 741(U)  BUN 8 - 23 mg/dL 17 13 22   Creatinine 0.61 - 1.24 mg/dL 3.84 5.36 4.68  Sodium 135 - 145 mmol/L 133(L) 131(L) 134(L)  Potassium 3.5 - 5.1 mmol/L 4.6 4.6 4.0  Chloride 98 - 111 mmol/L 98 95(L) 97(L)  CO2 22 - 32 mmol/L 26 27 30   Calcium 8.9 - 10.3 mg/dL 0.3(O) 9.3 9.2      NUTRITION - FOCUSED PHYSICAL EXAM: partial   Diet Order:   Diet Order            Diet heart healthy/carb modified Room service appropriate?  Yes; Fluid consistency: Thin  Diet effective now              EDUCATION NEEDS:   No education needs have been identified at this time Skin:  Skin Assessment: Reviewed RN Assessment  Last BM:  1/25  Height:   Ht Readings from Last 1 Encounters:  10/28/18 6\' 2"  (1.88 m)    Weight:   Wt Readings from Last 1 Encounters:  10/29/18 83.5 kg    Ideal Body Weight:  86 kg  BMI:  Body mass index is 23.64 kg/m.  Estimated Nutritional Needs:   Kcal:  2184- 2352 (26-28 kcal/kg/bw)  Protein:  100-109 (1.2-1.3 gr/kg/bw)  Fluid:  2.2-2.4 liters daily     Royann Shivers MS,RD,CSG,LDN Office: [phone] Pager: [phone]

## 2018-11-01 LAB — CULTURE, BLOOD (ROUTINE X 2)
Culture: NO GROWTH
Culture: NO GROWTH
Special Requests: ADEQUATE

## 2018-11-16 ENCOUNTER — Other Ambulatory Visit (HOSPITAL_COMMUNITY): Payer: Self-pay | Admitting: Respiratory Therapy

## 2018-11-16 DIAGNOSIS — J441 Chronic obstructive pulmonary disease with (acute) exacerbation: Secondary | ICD-10-CM

## 2018-12-06 ENCOUNTER — Ambulatory Visit (HOSPITAL_COMMUNITY)
Admission: RE | Admit: 2018-12-06 | Discharge: 2018-12-06 | Disposition: A | Discharge status: Home / Self Care | Payer: Medicare HMO | Source: Ambulatory Visit | Attending: Pulmonary Disease | Admitting: Pulmonary Disease

## 2018-12-06 DIAGNOSIS — J441 Chronic obstructive pulmonary disease with (acute) exacerbation: Secondary | ICD-10-CM | POA: Insufficient documentation

## 2018-12-06 LAB — PULMONARY FUNCTION TEST
DL/VA % pred: 37 %
DL/VA: 1.54 ml/min/mmHg/L
DLCO unc % pred: 25 %
DLCO unc: 7.67 ml/min/mmHg
FEF 25-75 Post: 0.48 L/sec
FEF 25-75 Pre: 0.21 L/sec
FEF2575-%Change-Post: 124 %
FEF2575-%Pred-Post: 15 %
FEF2575-%Pred-Pre: 6 %
FEV1-%Change-Post: 35 %
FEV1-%Pred-Post: 26 %
FEV1-%Pred-Pre: 19 %
FEV1-Post: 0.94 L
FEV1-Pre: 0.69 L
FEV1FVC-%Change-Post: 4 %
FEV1FVC-%PRED-PRE: 45 %
FEV6-%Change-Post: 33 %
FEV6-%PRED-PRE: 36 %
FEV6-%Pred-Post: 48 %
FEV6-POST: 2.12 L
FEV6-Pre: 1.6 L
FEV6FVC-%Change-Post: 2 %
FEV6FVC-%PRED-POST: 86 %
FEV6FVC-%Pred-Pre: 83 %
FVC-%Change-Post: 29 %
FVC-%Pred-Post: 55 %
FVC-%Pred-Pre: 43 %
FVC-Post: 2.55 L
FVC-Pre: 1.97 L
Post FEV1/FVC ratio: 37 %
Post FEV6/FVC ratio: 83 %
Pre FEV1/FVC ratio: 35 %
Pre FEV6/FVC Ratio: 81 %
RV % pred: 233 %
RV: 5.94 L
TLC % pred: 110 %
TLC: 8.6 L

## 2018-12-06 MED ORDER — ALBUTEROL SULFATE (2.5 MG/3ML) 0.083% IN NEBU
2.5000 mg | INHALATION_SOLUTION | Freq: Once | RESPIRATORY_TRACT | Status: AC
  Administered 2018-12-06: 2.5 mg via RESPIRATORY_TRACT

## 2018-12-09 ENCOUNTER — Encounter (HOSPITAL_COMMUNITY): Payer: Self-pay | Admitting: Emergency Medicine

## 2018-12-09 ENCOUNTER — Emergency Department (HOSPITAL_COMMUNITY): Payer: Medicare HMO

## 2018-12-09 ENCOUNTER — Other Ambulatory Visit: Payer: Self-pay

## 2018-12-09 ENCOUNTER — Observation Stay (HOSPITAL_COMMUNITY)
Admission: EM | Admit: 2018-12-09 | Discharge: 2018-12-11 | Disposition: A | Discharge status: Home / Self Care | Payer: Medicare HMO | Attending: Family Medicine | Admitting: Family Medicine

## 2018-12-09 DIAGNOSIS — Z7984 Long term (current) use of oral hypoglycemic drugs: Secondary | ICD-10-CM | POA: Diagnosis not present

## 2018-12-09 DIAGNOSIS — J45909 Unspecified asthma, uncomplicated: Secondary | ICD-10-CM | POA: Insufficient documentation

## 2018-12-09 DIAGNOSIS — F419 Anxiety disorder, unspecified: Secondary | ICD-10-CM | POA: Insufficient documentation

## 2018-12-09 DIAGNOSIS — Z87891 Personal history of nicotine dependence: Secondary | ICD-10-CM | POA: Insufficient documentation

## 2018-12-09 DIAGNOSIS — Z79899 Other long term (current) drug therapy: Secondary | ICD-10-CM | POA: Diagnosis not present

## 2018-12-09 DIAGNOSIS — R05 Cough: Secondary | ICD-10-CM | POA: Diagnosis present

## 2018-12-09 DIAGNOSIS — E119 Type 2 diabetes mellitus without complications: Secondary | ICD-10-CM | POA: Diagnosis not present

## 2018-12-09 DIAGNOSIS — J441 Chronic obstructive pulmonary disease with (acute) exacerbation: Principal | ICD-10-CM | POA: Insufficient documentation

## 2018-12-09 DIAGNOSIS — I10 Essential (primary) hypertension: Secondary | ICD-10-CM | POA: Diagnosis not present

## 2018-12-09 LAB — CBC WITH DIFFERENTIAL/PLATELET
Abs Immature Granulocytes: 0.02 10*3/uL (ref 0.00–0.07)
Basophils Absolute: 0 10*3/uL (ref 0.0–0.1)
Basophils Relative: 0 %
EOS PCT: 2 %
Eosinophils Absolute: 0.1 10*3/uL (ref 0.0–0.5)
HCT: 38.5 % — ABNORMAL LOW (ref 39.0–52.0)
Hemoglobin: 11.9 g/dL — ABNORMAL LOW (ref 13.0–17.0)
Immature Granulocytes: 0 %
Lymphocytes Relative: 25 %
Lymphs Abs: 1.8 10*3/uL (ref 0.7–4.0)
MCH: 27 pg (ref 26.0–34.0)
MCHC: 30.9 g/dL (ref 30.0–36.0)
MCV: 87.3 fL (ref 80.0–100.0)
Monocytes Absolute: 0.8 10*3/uL (ref 0.1–1.0)
Monocytes Relative: 11 %
Neutro Abs: 4.7 10*3/uL (ref 1.7–7.7)
Neutrophils Relative %: 62 %
Platelets: 250 10*3/uL (ref 150–400)
RBC: 4.41 MIL/uL (ref 4.22–5.81)
RDW: 13.9 % (ref 11.5–15.5)
WBC: 7.5 10*3/uL (ref 4.0–10.5)
nRBC: 0 % (ref 0.0–0.2)

## 2018-12-09 LAB — INFLUENZA PANEL BY PCR (TYPE A & B)
Influenza A By PCR: NEGATIVE
Influenza B By PCR: NEGATIVE

## 2018-12-09 LAB — TROPONIN I: TROPONIN I: 0.09 ng/mL — AB (ref <0.03)

## 2018-12-09 LAB — BASIC METABOLIC PANEL
Anion gap: 10 (ref 5–15)
BUN: 8 mg/dL (ref 8–23)
CO2: 29 mmol/L (ref 22–32)
Calcium: 9.8 mg/dL (ref 8.9–10.3)
Chloride: 98 mmol/L (ref 98–111)
Creatinine, Ser: 0.99 mg/dL (ref 0.61–1.24)
GFR calc Af Amer: >60 mL/min (ref >60)
GFR calc non Af Amer: >60 mL/min (ref >60)
Glucose, Bld: 202 mg/dL — ABNORMAL HIGH (ref 70–99)
POTASSIUM: 4.1 mmol/L (ref 3.5–5.1)
Sodium: 137 mmol/L (ref 135–145)

## 2018-12-09 LAB — GLUCOSE, CAPILLARY: Glucose-Capillary: 287 mg/dL — ABNORMAL HIGH (ref 70–99)

## 2018-12-09 LAB — LACTIC ACID, PLASMA
Lactic Acid, Venous: 2.5 mmol/L (ref 0.5–1.9)
Lactic Acid, Venous: 2.4 mmol/L (ref 0.5–1.9)

## 2018-12-09 MED ORDER — METHYLPREDNISOLONE SODIUM SUCC 125 MG IJ SOLR
60.0000 mg | Freq: Four times a day (QID) | INTRAMUSCULAR | Status: DC
  Administered 2018-12-09 – 2018-12-10 (×2): 60 mg via INTRAVENOUS
  Filled 2018-12-09 (×2): qty 2

## 2018-12-09 MED ORDER — INSULIN ASPART 100 UNIT/ML ~~LOC~~ SOLN
0.0000 [IU] | Freq: Three times a day (TID) | SUBCUTANEOUS | Status: DC
  Administered 2018-12-10 (×2): 9 [IU] via SUBCUTANEOUS
  Administered 2018-12-10: 5 [IU] via SUBCUTANEOUS

## 2018-12-09 MED ORDER — SODIUM CHLORIDE 0.9 % IV BOLUS
500.0000 mL | Freq: Once | INTRAVENOUS | Status: AC
  Administered 2018-12-09: 500 mL via INTRAVENOUS

## 2018-12-09 MED ORDER — GUAIFENESIN ER 600 MG PO TB12
1200.0000 mg | ORAL_TABLET | Freq: Two times a day (BID) | ORAL | Status: DC
  Administered 2018-12-09 – 2018-12-11 (×4): 1200 mg via ORAL
  Filled 2018-12-09 (×4): qty 2

## 2018-12-09 MED ORDER — FINASTERIDE 5 MG PO TABS
5.0000 mg | ORAL_TABLET | Freq: Every day | ORAL | Status: DC
  Administered 2018-12-10 – 2018-12-11 (×2): 5 mg via ORAL
  Filled 2018-12-09 (×2): qty 1

## 2018-12-09 MED ORDER — PANTOPRAZOLE SODIUM 40 MG PO TBEC
40.0000 mg | DELAYED_RELEASE_TABLET | Freq: Every morning | ORAL | Status: DC
  Administered 2018-12-10 – 2018-12-11 (×2): 40 mg via ORAL
  Filled 2018-12-09 (×2): qty 1

## 2018-12-09 MED ORDER — ALPRAZOLAM 1 MG PO TABS
1.0000 mg | ORAL_TABLET | Freq: Three times a day (TID) | ORAL | Status: DC | PRN
  Administered 2018-12-10 (×2): 1 mg via ORAL
  Filled 2018-12-09 (×2): qty 1

## 2018-12-09 MED ORDER — ROSUVASTATIN CALCIUM 20 MG PO TABS
40.0000 mg | ORAL_TABLET | Freq: Every day | ORAL | Status: DC
  Administered 2018-12-09 – 2018-12-10 (×2): 40 mg via ORAL
  Filled 2018-12-09 (×2): qty 2

## 2018-12-09 MED ORDER — SODIUM CHLORIDE 0.9 % IV SOLN
INTRAVENOUS | Status: DC
  Administered 2018-12-09: 23:00:00 via INTRAVENOUS

## 2018-12-09 MED ORDER — ALBUTEROL (5 MG/ML) CONTINUOUS INHALATION SOLN
10.0000 mg/h | INHALATION_SOLUTION | Freq: Once | RESPIRATORY_TRACT | Status: AC
  Administered 2018-12-09: 10 mg/h via RESPIRATORY_TRACT
  Filled 2018-12-09: qty 20

## 2018-12-09 MED ORDER — ONDANSETRON HCL 4 MG PO TABS: 4.0000 mg | ORAL_TABLET | Freq: Four times a day (QID) | ORAL | Status: DC | PRN

## 2018-12-09 MED ORDER — IPRATROPIUM BROMIDE 0.02 % IN SOLN
1.0000 mg | Freq: Once | RESPIRATORY_TRACT | Status: AC
  Administered 2018-12-09: 1 mg via RESPIRATORY_TRACT
  Filled 2018-12-09: qty 5

## 2018-12-09 MED ORDER — MONTELUKAST SODIUM 10 MG PO TABS
10.0000 mg | ORAL_TABLET | Freq: Every morning | ORAL | Status: DC
  Administered 2018-12-10 – 2018-12-11 (×2): 10 mg via ORAL
  Filled 2018-12-09 (×2): qty 1

## 2018-12-09 MED ORDER — OXYBUTYNIN CHLORIDE 5 MG PO TABS
5.0000 mg | ORAL_TABLET | Freq: Three times a day (TID) | ORAL | Status: DC
  Administered 2018-12-09 – 2018-12-11 (×5): 5 mg via ORAL
  Filled 2018-12-09 (×5): qty 1

## 2018-12-09 MED ORDER — ONDANSETRON HCL 4 MG/2ML IJ SOLN: 4.0000 mg | Freq: Four times a day (QID) | INTRAMUSCULAR | Status: DC | PRN

## 2018-12-09 MED ORDER — ALFUZOSIN HCL ER 10 MG PO TB24
10.0000 mg | ORAL_TABLET | Freq: Every day | ORAL | Status: DC
  Administered 2018-12-09 – 2018-12-10 (×2): 10 mg via ORAL
  Filled 2018-12-09 (×4): qty 1

## 2018-12-09 MED ORDER — IPRATROPIUM-ALBUTEROL 0.5-2.5 (3) MG/3ML IN SOLN
3.0000 mL | Freq: Four times a day (QID) | RESPIRATORY_TRACT | Status: DC
  Administered 2018-12-10 – 2018-12-11 (×7): 3 mL via RESPIRATORY_TRACT
  Filled 2018-12-09 (×8): qty 3

## 2018-12-09 MED ORDER — ENOXAPARIN SODIUM 40 MG/0.4ML ~~LOC~~ SOLN
40.0000 mg | SUBCUTANEOUS | Status: DC
  Administered 2018-12-09 – 2018-12-10 (×2): 40 mg via SUBCUTANEOUS
  Filled 2018-12-09 (×2): qty 0.4

## 2018-12-09 MED ORDER — METHYLPREDNISOLONE SODIUM SUCC 125 MG IJ SOLR
125.0000 mg | Freq: Once | INTRAMUSCULAR | Status: AC
  Administered 2018-12-09: 125 mg via INTRAVENOUS
  Filled 2018-12-09: qty 2

## 2018-12-09 NOTE — ED Notes (Signed)
Date and time results received: 12/09/18 2200  Test: Lactic Critical Value: 2.4  Name of Provider Notified: Lama-notified via Amion   Orders Received? Or Actions Taken?: n/a

## 2018-12-09 NOTE — ED Notes (Signed)
Pt. Walked from RM 6 to the nearest Pt. Bathroom and had shown signs of heavy breathing, and requested to return to the RM. Pt. O2 saturation was 96% upon returning to the Pt. RM.

## 2018-12-09 NOTE — ED Provider Notes (Signed)
[facility] EMERGENCY DEPARTMENT Provider Note   CSN: [id number] Arrival date & time: 12/09/18  1758    History   Chief Complaint Chief Complaint  Patient presents with  . Shortness of Breath    HPI Jeff Ray is a 63 y.o. male.     HPI  Pt was seen at 1815.  Per pt, c/o gradual onset and worsening of persistent cough, wheezing and SOB for the past 1 week. Has been using home O2 N/C, MDI and nebs with transient relief. Pt states both sides of his lower ribs "hurt from coughing." Pt was discharged from the hospital 10/30/18 for dx COPD exacerbation and influenza A.  Denies CP/palpitations, no back pain, no abd pain, no N/V/D, no fevers, no rash.    Past Medical History:  Diagnosis Date  . Anxiety   . Asthma   . COPD (chronic obstructive pulmonary disease) (HCC)   . Diabetes mellitus   . History of nuclear stress test 02/2016   low risk study  . Hypertension   . On home O2    2L N/C     Patient Active Problem List   Diagnosis Date Noted  . Acute respiratory failure with hypoxia (HCC) 10/28/2018  . Influenza A 10/27/2018  . COPD with acute exacerbation (HCC) 09/09/2018  . Chronic respiratory failure with hypoxia (HCC) 09/08/2018  . Uncontrolled type 2 diabetes mellitus with hyperglycemia (HCC) 09/08/2018  . Elevated troponin 09/08/2018  . SIRS (systemic inflammatory response syndrome) (HCC) 09/07/2018  . Asthma 09/07/2018  . HLD (hyperlipidemia) 09/07/2018  . GERD (gastroesophageal reflux disease) 09/07/2018  . Anxiety 09/07/2018  . BPH (benign prostatic hyperplasia) 09/07/2018  . Anemia 08/03/2015  . CAP (community acquired pneumonia) 06/02/2013  . Respiratory failure, acute and chronic (HCC) 05/26/2013  . COPD exacerbation (HCC) 05/26/2013  . HTN (hypertension) 05/26/2013  . DM (diabetes mellitus) (HCC) 05/26/2013  . Tobacco abuse 05/26/2013    Past Surgical History:  Procedure Laterality Date  . BACK SURGERY          Home Medications    Prior to  Admission medications   Medication Sig Start Date End Date Taking? Authorizing Provider  albuterol (PROAIR HFA) 108 (90 BASE) MCG/ACT inhaler Inhale 2 puffs into the lungs every 6 (six) hours as needed for wheezing or shortness of breath. Shortness of Breath    [provider]  alfuzosin (UROXATRAL) 10 MG 24 hr tablet Take 10 mg by mouth at bedtime.     [provider]  ALPRAZolam Prudy Feeler) 1 MG tablet Take 1 mg by mouth 3 (three) times daily as needed for anxiety. 09/30/17   [provider]  Ernestina Patches 62.5-25 MCG/INH AEPB Take 1 puff by mouth daily. 08/31/17   [provider]  fexofenadine-pseudoephedrine (ALLEGRA-D 24) 180-240 MG 24 hr tablet Take 1 tablet by mouth daily.    [provider]  finasteride (PROSCAR) 5 MG tablet Take 5 mg by mouth daily.    [provider]  fluticasone (FLONASE) 50 MCG/ACT nasal spray Place 2 sprays into both nostrils daily. 09/30/17   [provider]  glimepiride (AMARYL) 2 MG tablet Take 2 mg by mouth daily with breakfast.    [provider]  Guaifenesin (MUCINEX MAXIMUM STRENGTH) 1200 MG TB12 Take 1 tablet by mouth 2 (two) times daily.     [provider]  ibuprofen (ADVIL,MOTRIN) 800 MG tablet Take 800 mg by mouth every 8 (eight) hours as needed for mild pain or moderate pain.  [provider]  ipratropium-albuterol (DUONEB) 0.5-2.5 (3) MG/3ML SOLN Take 3 mLs by nebulization every 4 (four) hours as needed. For shortness of breath    [provider]  metFORMIN (GLUCOPHAGE-XR) 500 MG 24 hr tablet Take 500 mg by mouth 2 (two) times daily.     [provider]  montelukast (SINGULAIR) 10 MG tablet Take 10 mg by mouth every morning.     [provider]  Multiple Vitamins-Minerals (CENTRUM SILVER 50+MEN PO) Take 1 tablet by mouth daily.    [provider]  oxybutynin (DITROPAN) 5 MG tablet Take 5 mg by mouth 3 (three) times daily.    [provider]  pantoprazole (PROTONIX) 40 MG tablet Take 40 mg by mouth every morning.     [provider]  predniSONE (DELTASONE) 20 MG tablet Take 3 tablets by mouth daily x1 day; then 2 tablet by mouth daily x2 days; then 1 tablet by mouth daily x3 days; then half tablet by mouth daily x3 days and stop prednisone. 10/30/18   Vassie Loll, MD  rosuvastatin (CRESTOR) 40 MG tablet Take 40 mg by mouth at bedtime.  07/24/17   [provider]  triamcinolone cream (KENALOG) 0.5 % Apply 1 application topically 3 (three) times daily. Applied to hands    [provider]    Family History Family History  Problem Relation Age of Onset  . Diabetes Mother     Social History Social History   Tobacco Use  . Smoking status: Former Smoker    Packs/day: 0.00    Years: 10.00    Pack years: 0.00    Types: Cigarettes    Last attempt to quit: 10/20/2012    Years since quitting: 6.1  . Smokeless tobacco: Never Used  Substance Use Topics  . Alcohol use: No    Alcohol/week: 0.0 standard drinks  . Drug use: No     Allergies   Patient has no known allergies.   Review of Systems Review of Systems ROS: Statement: All systems negative except as marked or noted in the HPI; Constitutional: Negative for fever and chills. ; ; Eyes: Negative for eye pain, redness and discharge. ; ; ENMT: Negative for ear pain, hoarseness, nasal congestion, sinus pressure and sore throat. ; ; Cardiovascular: Negative for chest pain, palpitations, diaphoresis, and peripheral edema. ; ; Respiratory: +cough, wheezing. SOB. Negative for stridor. ; ; Gastrointestinal: Negative for nausea, vomiting, diarrhea, abdominal pain, blood in stool, hematemesis, jaundice and rectal bleeding. . ; ; Genitourinary: Negative for dysuria, flank pain and hematuria. ; ; Musculoskeletal: Negative for back pain and neck pain. Negative for swelling and trauma.; ; Skin: Negative for pruritus, rash, abrasions, blisters,  bruising and skin lesion.; ; Neuro: Negative for headache, lightheadedness and neck stiffness. Negative for weakness, altered level of consciousness, altered mental status, extremity weakness, paresthesias, involuntary movement, seizure and syncope.       Physical Exam Updated Vital Signs BP 137/69 (BP Location: Left Arm)   Pulse (!) 107   Temp 98.7 F (37.1 C) (Oral)   Resp (!) 22   Ht  (1.88 m)   Wt 88.9 kg   SpO2 97%   BMI 25.16 kg/m    Patient Vitals for the past 24 hrs:  BP Temp Temp src Pulse Resp SpO2 Height Weight  12/09/18 2000 139/76 - - (!) 115 16 100 % - -  12/09/18 1930 130/76 - - (!) 106 18 100 % - -  12/09/18 1923 (!) 156/73 - [name])  103 16 100 % - -  12/09/18 1830 (!) 136/117 - - (!) 103 16 98 % - -  12/09/18 1813 137/69 98.7 F (37.1 C) Oral (!) 107 (!) 22 97 % - -  12/09/18 1812 - - - - - -  (1.88 m) 88.9 kg     Physical Exam 1820: Physical examination:  Nursing notes reviewed; Vital signs and O2 SAT reviewed;  Constitutional: Well developed, Well nourished, Uncomfortable appearing.;; Head:  Normocephalic, atraumatic; Eyes: EOMI, PERRL, No scleral icterus; ENMT: Mouth and pharynx normal, Mucous membranes dry; Neck: Supple, Full range of motion, No lymphadenopathy; Cardiovascular: Tachycardic rate and rhythm, No gallop; Respiratory: Breath sounds diminished & equal bilaterally, insp/exp wheezes bilat. No audible wheezing.  Speaking short sentences, sitting upright, tachypneic.; Chest: Nontender, Movement normal; Abdomen: Soft, Nontender, Nondistended, Normal bowel sounds; Genitourinary: No CVA tenderness; Extremities: Pulses normal, No tenderness, No edema, No calf edema or asymmetry.; Neuro: AA&Ox3, Major CN grossly intact.  Speech clear. No gross focal motor or sensory deficits in extremities.; Skin: Color normal, Warm, Dry.     ED Treatments / Results  Labs (all labs ordered are listed, but only abnormal results are displayed)   EKG EKG  Interpretation  Date/Time:  Saturday December 09 2018 18:19:31 EST Ventricular Rate:  104 PR Interval:    QRS Duration: 124 QT Interval:  337 QTC Calculation: 444 R Axis:   -2 Text Interpretation:  Sinus tachycardia Right bundle branch block Baseline wander When compared with ECG of 10/27/2018 No significant change was found Confirmed by Samuel Jester [phone]) on 12/09/2018 6:27:36 PM   Radiology   Procedures Procedures (including critical care time)  Medications Ordered in ED Medications  methylPREDNISolone sodium succinate (SOLU-MEDROL) 125 mg/2 mL injection 125 mg (has no administration in time range)  albuterol (PROVENTIL,VENTOLIN) solution continuous neb (has no administration in time range)  ipratropium (ATROVENT) nebulizer solution 1 mg (has no administration in time range)     Initial Impression / Assessment and Plan / ED Course  I have reviewed the triage vital signs and the nursing notes.  Pertinent labs & imaging results that were available during my care of the patient were reviewed by me and considered in my medical decision making (see chart for details).     MDM Reviewed: previous chart, nursing note and vitals Reviewed previous: labs and ECG Interpretation: labs, ECG and x-ray Total time providing critical care: 30-74 minutes. This excludes time spent performing separately reportable procedures and services. Consults: admitting MD   CRITICAL CARE Performed by: Samuel Jester Total critical care time: 35 minutes Critical care time was exclusive of separately billable procedures and treating other patients. Critical care was necessary to treat or prevent imminent or life-threatening deterioration. Critical care was time spent personally by me on the following activities: development of treatment plan with patient and/or surrogate as well as nursing, discussions with consultants, evaluation of patient's response to treatment, examination of patient, obtaining  history from patient or surrogate, ordering and performing treatments and interventions, ordering and review of laboratory studies, ordering and review of radiographic studies, pulse oximetry and re-evaluation of patient's condition.   Results for orders placed or performed during the hospital encounter of 12/09/18  Basic metabolic panel  Result Value Ref Range   Sodium 137 135 - 145 mmol/L   Potassium 4.1 3.5 - 5.1 mmol/L   Chloride 98 98 - 111 mmol/L   CO2 29 22 - 32 mmol/L   Glucose, Bld 202 (H) 70 - 99  mg/dL   BUN 8 8 - 23 mg/dL   Creatinine, Ser 8.01 0.61 - 1.24 mg/dL   Calcium 9.8 8.9 - 65.5 mg/dL   GFR calc non Af Amer >60 >60 mL/min   GFR calc Af Amer >60 >60 mL/min   Anion gap 10 5 - 15  Troponin I - Once  Result Value Ref Range   Troponin I 0.09 (HH) <0.03 ng/mL  Lactic acid, plasma  Result Value Ref Range   Lactic Acid, Venous 2.5 (HH) 0.5 - 1.9 mmol/L  CBC with Differential  Result Value Ref Range   WBC 7.5 4.0 - 10.5 K/uL   RBC 4.41 4.22 - 5.81 MIL/uL   Hemoglobin 11.9 (L) 13.0 - 17.0 g/dL   HCT 37.4 (L) 82.7 - 07.8 %   MCV 87.3 80.0 - 100.0 fL   MCH 27.0 26.0 - 34.0 pg   MCHC 30.9 30.0 - 36.0 g/dL   RDW 67.5 44.9 - 20.1 %   Platelets 250 150 - 400 K/uL   nRBC 0.0 0.0 - 0.2 %   Neutrophils Relative % 62 %   Neutro Abs 4.7 1.7 - 7.7 K/uL   Lymphocytes Relative 25 %   Lymphs Abs 1.8 0.7 - 4.0 K/uL   Monocytes Relative 11 %   Monocytes Absolute 0.8 0.1 - 1.0 K/uL   Eosinophils Relative 2 %   Eosinophils Absolute 0.1 0.0 - 0.5 K/uL   Basophils Relative 0 %   Basophils Absolute 0.0 0.0 - 0.1 K/uL   Immature Granulocytes 0 %   Abs Immature Granulocytes 0.02 0.00 - 0.07 K/uL   Dg Chest Port 1 View Result Date: 12/09/2018 CLINICAL DATA:  Cough EXAM: PORTABLE CHEST 1 VIEW COMPARISON:  10/27/2018 FINDINGS: There is hyperinflation of the lungs compatible with COPD. Bibasilar densities much improved since prior study, likely residual atelectasis. Heart is normal size.  No effusions or acute bony abnormality. IMPRESSION: COPD.  Bibasilar atelectasis, improving since prior study. Electronically Signed   By: Charlett Nose M.D.   On: 12/09/2018 18:52    2045:  On arrival: pt sitting upright, tachypneic, tachycardic, Sats 97 % O2 2L N/C, lungs diminished with wheezing. IV solumedrol and hour long neb started. After neb: pt appears more comfortable at rest, less tachypneic, Sats 97 % on O2 2L N/C, lungs coarse with scattered wheezing. Pt ambulated in hallway: pt's O2 Sats dropped to 96 % on O2 2L N/C with pt c/o increasing SOB, with increasing RR and HR to 130's. Pt escorted back to stretcher with O2 Sats slowly increasing to 97 % on O2 2L N/C.  T/C returned from [facility]. Sharl Ma, case discussed, including:  HPI, pertinent PM/SHx, VS/PE, dx testing, ED course and treatment:  Agreeable to admit.        Final Clinical Impressions(s) / ED Diagnoses   Final diagnoses:  None    ED Discharge Orders    None       Samuel Jester, DO 12/13/18 1542

## 2018-12-09 NOTE — ED Notes (Signed)
Date and time results received: 12/09/18 1950  Test: Lactic Acid Critical Value: 2.5  Name of Provider Notified: Clarene Duke, MD

## 2018-12-09 NOTE — ED Notes (Signed)
Date and time results received: 12/09/18 20:30 (use smartphrase ".now" to insert current time)   Test: trop  Critical Value: 0.09  Name of Provider Notified: Dr Clarene Duke  Orders Received? Or Actions Taken?: no additional orders given,

## 2018-12-09 NOTE — ED Triage Notes (Signed)
Increased sob and cough x 1 week

## 2018-12-09 NOTE — H&P (Addendum)
[facility] H&P    Patient Demographics:    Jeff Ray, is a 63 y.o. male  MRN: [id number]  DOB - 1956/01/13  Admit Date - 12/09/2018  Referring MD/NP/PA: Dr. Clarene Duke  Outpatient Primary MD for the patient is Gareth Morgan, MD  Patient coming from: Home  Chief complaint-shortness of breath   HPI:    Jeff Ray  is a 63 y.o. male, with history of COPD, chronic hypoxic respiratory failure on 2 L of oxygen at home, diabetes mellitus type 2, hypertension, hyperlipidemia came to hospital with worsening shortness of breath.  Patient was discharged from hospital on 10/30/2018 after treatment for COPD exacerbation and influenza A. Patient states that for past few days he has been coughing up green-colored phlegm.  Also has been coughing more than usual.  Patient complains of pain in the ribs from coughing. Denies nausea vomiting or diarrhea. Denies headache or blurred vision. Denies passing out.    Review of systems:    In addition to the HPI above,    All other systems reviewed and are negative.    Past History of the following :    Past Medical History:  Diagnosis Date  . Anxiety   . Asthma   . COPD (chronic obstructive pulmonary disease) (HCC)   . Diabetes mellitus   . History of nuclear stress test 02/2016   low risk study  . Hypertension   . On home O2    2L N/C       Past Surgical History:  Procedure Laterality Date  . BACK SURGERY        Social History:      Social History   Tobacco Use  . Smoking status: Former Smoker    Packs/day: 0.00    Years: 10.00    Pack years: 0.00    Types: Cigarettes    Last attempt to quit: 10/20/2012    Years since quitting: 6.1  . Smokeless tobacco: Never Used  Substance Use Topics  . Alcohol use: No    Alcohol/week: 0.0 standard drinks       Family History :     Family History  Problem Relation Age of Onset  . Diabetes Mother       Home Medications:   Prior to Admission medications   Medication Sig Start Date End Date Taking? Authorizing Provider  albuterol (PROAIR HFA) 108 (90 BASE) MCG/ACT inhaler Inhale 2 puffs into the lungs every 6 (six) hours as needed for wheezing or shortness of breath. Shortness of Breath   Yes [provider]  alfuzosin (UROXATRAL) 10 MG 24 hr tablet Take 10 mg by mouth at bedtime.    Yes [provider]  ALPRAZolam Prudy Feeler) 1 MG tablet Take 1 mg by mouth 3 (three) times daily as needed for anxiety. 09/30/17  Yes [provider]  fexofenadine-pseudoephedrine (ALLEGRA-D 24) 180-240 MG 24 hr tablet Take 1 tablet by mouth daily.   Yes [provider]  finasteride (PROSCAR) 5 MG tablet Take 5 mg by mouth daily.   Yes [provider]  fluticasone (FLONASE) 50 MCG/ACT nasal spray Place 2 sprays into both nostrils daily. 09/30/17  Yes [provider]  glimepiride (AMARYL) 2 MG tablet Take 2 mg by mouth daily with breakfast.   Yes [provider]  Guaifenesin (MUCINEX MAXIMUM STRENGTH) 1200 MG TB12 Take 1 tablet by mouth 2 (two) times daily.    Yes [provider]  ibuprofen (ADVIL,MOTRIN) 800 MG tablet Take 800 mg by mouth every 8 (eight) hours as needed for mild pain or moderate pain.   Yes [provider]  ipratropium-albuterol (DUONEB) 0.5-2.5 (3) MG/3ML SOLN Take 3 mLs by nebulization every 4 (four) hours as needed. For shortness of breath   Yes [provider]  metFORMIN (GLUCOPHAGE-XR) 500 MG 24 hr tablet Take 500 mg by mouth 2 (two) times daily.    Yes [provider]  montelukast (SINGULAIR) 10 MG tablet Take 10 mg by mouth every morning.    Yes [provider]  Multiple Vitamins-Minerals (CENTRUM SILVER 50+MEN PO) Take 1 tablet by mouth daily.   Yes [provider]  oxybutynin (DITROPAN) 5 MG tablet Take 5 mg by mouth 3 (three) times daily.   Yes [provider]    pantoprazole (PROTONIX) 40 MG tablet Take 40 mg by mouth every morning.    Yes [provider]  rosuvastatin (CRESTOR) 40 MG tablet Take 40 mg by mouth at bedtime.  07/24/17  Yes [provider]  TRELEGY ELLIPTA 100-62.5-25 MCG/INH AEPB Take 2 puffs by mouth daily. 11/17/18  Yes [provider]  triamcinolone cream (KENALOG) 0.5 % Apply 1 application topically 3 (three) times daily. Applied to hands   Yes [provider]  Ernestina Patches 62.5-25 MCG/INH AEPB Take 1 puff by mouth daily. 08/31/17   [provider]  predniSONE (DELTASONE) 20 MG tablet Take 3 tablets by mouth daily x1 day; then 2 tablet by mouth daily x2 days; then 1 tablet by mouth daily x3 days; then half tablet by mouth daily x3 days and stop prednisone. 10/30/18   Vassie Loll, MD     Allergies:    No Known Allergies   Physical Exam:   Vitals  Blood pressure 139/76, pulse (!) 115, temperature 98.7 F (37.1 C), temperature source Oral, resp. rate 16, height 6\' 2"  (1.88 m), weight 88.9 kg, SpO2 100 %.  1.  General: Appears in no acute distress  2. Psychiatric: Alert, oriented x3, intact insight and judgment  3. Neurologic: Cranial nerves II through XII grossly intact, motor strength 5/5 in all extremities  4. HEENMT:  Atraumatic normocephalic, oral mucosa is moist  5. Respiratory : Bilateral rhonchi auscultated  6. Cardiovascular : S1-S2, regular, no murmur auscultated  7. Gastrointestinal:  Abdomen is soft, nontender, no organomegaly     Data Review:    CBC Recent Labs  Lab 12/09/18 1916  WBC 7.5  HGB 11.9*  HCT 38.5*  PLT 250  MCV 87.3  MCH 27.0  MCHC 30.9  RDW 13.9  LYMPHSABS 1.8  MONOABS 0.8  EOSABS 0.1  BASOSABS 0.0   ------------------------------------------------------------------------------------------------------------------  Results for orders placed or performed during the hospital encounter of 12/09/18 (from the past 48 hour(s))   Basic metabolic panel     Status: Abnormal   Collection Time: 12/09/18  7:16 PM  Result Value Ref Range   Sodium 137 135 - 145 mmol/L   Potassium 4.1 3.5 - 5.1 mmol/L   Chloride 98 98 - 111 mmol/L   CO2 29 22 - 32 mmol/L  Glucose, Bld 202 (H) 70 - 99 mg/dL   BUN 8 8 - 23 mg/dL   Creatinine, Ser 3.29 0.61 - 1.24 mg/dL   Calcium 9.8 8.9 - 92.4 mg/dL   GFR calc non Af Amer >60 >60 mL/min   GFR calc Af Amer >60 >60 mL/min   Anion gap 10 5 - 15    Comment: Performed at [facility], [address] [place], [place] [address]  Troponin I - Once     Status: Abnormal   Collection Time: 12/09/18  7:16 PM  Result Value Ref Range   Troponin I 0.09 (HH) <0.03 ng/mL    Comment: CRITICAL RESULT CALLED TO, READ BACK BY AND VERIFIED WITH: POINDEXTER,M @ 2028 ON 12/09/18 BY JUW Performed at [facility], [address] [place], [place] [address]   CBC with Differential     Status: Abnormal   Collection Time: 12/09/18  7:16 PM  Result Value Ref Range   WBC 7.5 4.0 - 10.5 K/uL   RBC 4.41 4.22 - 5.81 MIL/uL   Hemoglobin 11.9 (L) 13.0 - 17.0 g/dL   HCT 29.7 (L) 98.9 - 21.1 %   MCV 87.3 80.0 - 100.0 fL   MCH 27.0 26.0 - 34.0 pg   MCHC 30.9 30.0 - 36.0 g/dL   RDW 94.1 74.0 - 81.4 %   Platelets 250 150 - 400 K/uL   nRBC 0.0 0.0 - 0.2 %   Neutrophils Relative % 62 %   Neutro Abs 4.7 1.7 - 7.7 K/uL   Lymphocytes Relative 25 %   Lymphs Abs 1.8 0.7 - 4.0 K/uL   Monocytes Relative 11 %   Monocytes Absolute 0.8 0.1 - 1.0 K/uL   Eosinophils Relative 2 %   Eosinophils Absolute 0.1 0.0 - 0.5 K/uL   Basophils Relative 0 %   Basophils Absolute 0.0 0.0 - 0.1 K/uL   Immature Granulocytes 0 %   Abs Immature Granulocytes 0.02 0.00 - 0.07 K/uL    Comment: Performed at [facility], [address], [place], [place] [address]  Lactic acid, plasma     Status: Abnormal   Collection Time: 12/09/18  7:17 PM  Result Value Ref Range   Lactic Acid, Venous 2.5 (HH) 0.5 - 1.9 mmol/L    Comment: CRITICAL RESULT  CALLED TO, READ BACK BY AND VERIFIED WITH: SAPPELT,J @ 1950 ON 12/09/18 BY JUW Performed at [facility], [address], [place], [place] [address]     Chemistries  Recent Labs  Lab 12/09/18 1916  NA 137  K 4.1  CL 98  CO2 29  GLUCOSE 202*  BUN 8  CREATININE 0.99  CALCIUM 9.8   ------------------------------------------------------------------------------------------------------------------  ------------------------------------------------------------------------------------------------------------------ GFR: Estimated Creatinine Clearance: 87.6 mL/min (by C-G formula based on SCr of 0.99 mg/dL). Liver Function Tests: No results for input(s): AST, ALT, ALKPHOS, BILITOT, PROT, ALBUMIN in the last 168 hours. No results for input(s): LIPASE, AMYLASE in the last 168 hours. No results for input(s): AMMONIA in the last 168 hours. Coagulation Profile: No results for input(s): INR, PROTIME in the last 168 hours. Cardiac Enzymes: Recent Labs  Lab 12/09/18 1916  TROPONINI 0.09*     --------------------------------------------------------------------------------------------------------------- Urine analysis:    Component Value Date/Time   COLORURINE YELLOW 10/27/2018 0458   APPEARANCEUR CLEAR 10/27/2018 0458   LABSPEC 1.018 10/27/2018 0458   PHURINE 5.0 10/27/2018 0458   GLUCOSEU NEGATIVE 10/27/2018 0458   HGBUR SMALL (A) 10/27/2018 0458   BILIRUBINUR NEGATIVE 10/27/2018 0458   KETONESUR NEGATIVE 10/27/2018 0458  PROTEINUR NEGATIVE 10/27/2018 0458   UROBILINOGEN 0.2 02/08/2008 1731   NITRITE NEGATIVE 10/27/2018 0458   LEUKOCYTESUR NEGATIVE 10/27/2018 0458      Imaging Results:    Dg Chest Port 1 View  Result Date: 12/09/2018 CLINICAL DATA:  Cough EXAM: PORTABLE CHEST 1 VIEW COMPARISON:  10/27/2018 FINDINGS: There is hyperinflation of the lungs compatible with COPD. Bibasilar densities much improved since prior study, likely residual atelectasis. Heart is normal size.  No effusions or acute bony abnormality. IMPRESSION: COPD.  Bibasilar atelectasis, improving since prior study. Electronically Signed   By: Charlett Nose M.D.   On: 12/09/2018 18:52     My personal review of EKG: Rhythm NSR, Right bundle branch block   Assessment & Plan:    Active Problems:   COPD exacerbation (HCC)   1. Acute on chronic hypoxic respiratory failure-patient presenting with worsening of chronic hypoxic respiratory failure, secondary to COPD exacerbation.  Started on Solu-Medrol, DuoNeb nebulizers, Mucinex.  Influenza panel is negative.  2. COPD exacerbation-as above started on Solu-Medrol, duo nebs Mucinex.  3. Elevated troponin-patient has mild elevation of troponin, which is chronically elevated.  Complaint of chest pain from coughing does not appear typical.  Will cycle troponin every 6 hours x3.  EKG showed right bundle branch block.  4. Diabetes mellitus type 2-we will start sliding scale insulin with NovoLog, check CBG q. before meals and at bedtime.  Carb modified diet.  Hold oral hypoglycemics which patient takes at home.  5. BPH-continue Uroxatral, finasteride.  6. Anxiety-continue Xanax 1 mg 3 times daily as needed   DVT Prophylaxis-   Lovenox  AM Labs Ordered, also please review Full Orders  Family Communication: Admission, patients condition and plan of care including tests being ordered have been discussed with the patient  who indicate understanding and agree with the plan and Code Status.  Code Status: Full code  Admission status: Observation: Based on patients clinical presentation and evaluation of above clinical data, I have made determination that patient will need less than 2 midnight stay in the hospital.  Time spent in minutes : 60 minutes   Meredeth Ide M.D on 12/09/2018 at 9:13 PM

## 2018-12-10 ENCOUNTER — Other Ambulatory Visit (HOSPITAL_COMMUNITY): Payer: Medicare HMO

## 2018-12-10 DIAGNOSIS — J441 Chronic obstructive pulmonary disease with (acute) exacerbation: Secondary | ICD-10-CM | POA: Diagnosis not present

## 2018-12-10 LAB — CBC
HCT: 36.6 % — ABNORMAL LOW (ref 39.0–52.0)
Hemoglobin: 11.5 g/dL — ABNORMAL LOW (ref 13.0–17.0)
MCH: 27.2 pg (ref 26.0–34.0)
MCHC: 31.4 g/dL (ref 30.0–36.0)
MCV: 86.5 fL (ref 80.0–100.0)
Platelets: 256 10*3/uL (ref 150–400)
RBC: 4.23 MIL/uL (ref 4.22–5.81)
RDW: 14.1 % (ref 11.5–15.5)
WBC: 6.7 10*3/uL (ref 4.0–10.5)
nRBC: 0 % (ref 0.0–0.2)

## 2018-12-10 LAB — COMPREHENSIVE METABOLIC PANEL
ALT: 18 U/L (ref 0–44)
AST: 17 U/L (ref 15–41)
Albumin: 3.5 g/dL (ref 3.5–5.0)
Alkaline Phosphatase: 74 U/L (ref 38–126)
Anion gap: 14 (ref 5–15)
BUN: 11 mg/dL (ref 8–23)
CO2: 25 mmol/L (ref 22–32)
Calcium: 9.5 mg/dL (ref 8.9–10.3)
Chloride: 96 mmol/L — ABNORMAL LOW (ref 98–111)
Creatinine, Ser: 0.95 mg/dL (ref 0.61–1.24)
GFR calc Af Amer: >60 mL/min (ref >60)
GFR calc non Af Amer: >60 mL/min (ref >60)
GLUCOSE: 381 mg/dL — AB (ref 70–99)
Potassium: 4.7 mmol/L (ref 3.5–5.1)
SODIUM: 135 mmol/L (ref 135–145)
Total Bilirubin: 0.6 mg/dL (ref 0.3–1.2)
Total Protein: 7.3 g/dL (ref 6.5–8.1)

## 2018-12-10 LAB — GLUCOSE, CAPILLARY
Glucose-Capillary: 352 mg/dL — ABNORMAL HIGH (ref 70–99)
Glucose-Capillary: 404 mg/dL — ABNORMAL HIGH (ref 70–99)
Glucose-Capillary: 288 mg/dL — ABNORMAL HIGH (ref 70–99)
Glucose-Capillary: 254 mg/dL — ABNORMAL HIGH (ref 70–99)
Glucose-Capillary: 227 mg/dL — ABNORMAL HIGH (ref 70–99)

## 2018-12-10 LAB — TROPONIN I
Troponin I: 0.08 ng/mL (ref <0.03)
Troponin I: 0.08 ng/mL (ref <0.03)
Troponin I: 0.08 ng/mL (ref <0.03)

## 2018-12-10 LAB — HEMOGLOBIN A1C
Hgb A1c MFr Bld: 10 % — ABNORMAL HIGH (ref 4.8–5.6)
Mean Plasma Glucose: 240.3 mg/dL

## 2018-12-10 MED ORDER — AZITHROMYCIN 250 MG PO TABS
500.0000 mg | ORAL_TABLET | Freq: Every day | ORAL | Status: DC
  Administered 2018-12-10 – 2018-12-11 (×2): 500 mg via ORAL
  Filled 2018-12-10 (×2): qty 2

## 2018-12-10 MED ORDER — METHYLPREDNISOLONE SODIUM SUCC 40 MG IJ SOLR
40.0000 mg | Freq: Two times a day (BID) | INTRAMUSCULAR | Status: DC
  Administered 2018-12-11 (×2): 40 mg via INTRAVENOUS
  Filled 2018-12-10 (×2): qty 1

## 2018-12-10 MED ORDER — INSULIN ASPART 100 UNIT/ML ~~LOC~~ SOLN
0.0000 [IU] | Freq: Every day | SUBCUTANEOUS | Status: DC
  Administered 2018-12-10: 2 [IU] via SUBCUTANEOUS

## 2018-12-10 MED ORDER — INSULIN ASPART 100 UNIT/ML ~~LOC~~ SOLN
0.0000 [IU] | Freq: Three times a day (TID) | SUBCUTANEOUS | Status: DC
  Administered 2018-12-11: 8 [IU] via SUBCUTANEOUS
  Administered 2018-12-11: 15 [IU] via SUBCUTANEOUS

## 2018-12-10 MED ORDER — ASPIRIN 81 MG PO CHEW
81.0000 mg | CHEWABLE_TABLET | Freq: Every day | ORAL | Status: DC
  Administered 2018-12-10 – 2018-12-11 (×2): 81 mg via ORAL
  Filled 2018-12-10 (×2): qty 1

## 2018-12-10 MED ORDER — CARVEDILOL 3.125 MG PO TABS
3.1250 mg | ORAL_TABLET | Freq: Two times a day (BID) | ORAL | Status: DC
  Administered 2018-12-10 – 2018-12-11 (×3): 3.125 mg via ORAL
  Filled 2018-12-10 (×3): qty 1

## 2018-12-10 MED ORDER — INSULIN ASPART 100 UNIT/ML ~~LOC~~ SOLN
3.0000 [IU] | Freq: Three times a day (TID) | SUBCUTANEOUS | Status: DC
  Administered 2018-12-10 – 2018-12-11 (×3): 3 [IU] via SUBCUTANEOUS

## 2018-12-10 MED ORDER — METFORMIN HCL 850 MG PO TABS
850.0000 mg | ORAL_TABLET | Freq: Two times a day (BID) | ORAL | Status: DC
  Administered 2018-12-11: 850 mg via ORAL
  Filled 2018-12-10 (×6): qty 1

## 2018-12-10 MED ORDER — METHYLPREDNISOLONE SODIUM SUCC 40 MG IJ SOLR
40.0000 mg | Freq: Three times a day (TID) | INTRAMUSCULAR | Status: DC
  Administered 2018-12-10: 40 mg via INTRAVENOUS
  Filled 2018-12-10: qty 1

## 2018-12-10 NOTE — Progress Notes (Signed)
Patient Demographics:    Jeff Ray, is a 63 y.o. male, DOB - 12/26/1955, TDD:[id number]  Admit date - 12/09/2018   Admitting Physician Meredeth Ide, MD  Outpatient Primary MD for the patient is Gareth Morgan, MD  LOS - 0   Chief Complaint  Patient presents with  . Shortness of Breath        Subjective:    Jeff Ray today has no fevers, no emesis,  No chest pain, cough and shortness of breath persist, no dizziness no palpitations  Assessment  & Plan :    Active Problems:   COPD exacerbation [facility])  Brief Summary 63 y.o. male, with history of COPD, chronic hypoxic respiratory failure on 2 L of oxygen at home, diabetes mellitus type 2, hypertension, hyperlipidemia came to hospital with worsening shortness of breath.  Patient was discharged from hospital on 10/30/2018 after treatment for COPD exacerbation and influenza A, readmitted 12/09/2018 with shortness of breath and concerns about COPD exacerbation, but also noted to have borderline troponin elevation  Plan:- 1)Acute COPD Exacerbation- no definite pneumonia, continue IV Solu-Medrol 40 mg every 12 hours, give mucolytics, azithromycin and bronchodilators as ordered, supplemental oxygen as ordered.    2)DM2-A1c is 10.0 reflecting poor diabetic control, restart metformin, anticipate worsening hyperglycemia with steroids, Use Novolog/Humalog Sliding scale insulin with Accu-Cheks/Fingersticks as ordered   3)Elevated Troponin----troponin peaked at 0.09, current troponin levels consistent with his previous levels, patient is chest pain-free, doubt ACS,... Give aspirin 81 mg daily, crestor , Coreg 3.125 mg twice daily, check echocardiogram to evaluate EF and rule out significant regional wall motion normalities  Disposition/Need for in-Hospital Stay- patient unable to be discharged at this time due to COPD flareup and elevated troponin requiring further  treatment and further work-up respectively  Code Status : full  Family Communication:   na   Disposition Plan  : home  Consults  :  na  DVT Prophylaxis  :  Lovenox -   Lab Results  Component Value Date   PLT 256 12/10/2018    Inpatient Medications  Scheduled Meds: . alfuzosin  10 mg Oral QHS  . aspirin  81 mg Oral Daily  . azithromycin  500 mg Oral Daily  . carvedilol  3.125 mg Oral BID WC  . enoxaparin (LOVENOX) injection  40 mg Subcutaneous Q24H  . finasteride  5 mg Oral Daily  . guaiFENesin  1,200 mg Oral BID  . insulin aspart  0-15 Units Subcutaneous TID WC  . insulin aspart  0-5 Units Subcutaneous QHS  . insulin aspart  3 Units Subcutaneous TID WC  . ipratropium-albuterol  3 mL Nebulization Q6H  . metFORMIN  850 mg Oral BID WC  . [START ON 12/11/2018] methylPREDNISolone (SOLU-MEDROL) injection  40 mg Intravenous Q12H  . montelukast  10 mg Oral q morning - 10a  . oxybutynin  5 mg Oral TID  . pantoprazole  40 mg Oral q morning - 10a  . rosuvastatin  40 mg Oral QHS   Continuous Infusions: . sodium chloride 10 mL/hr at 12/09/18 2301   PRN Meds:.ALPRAZolam, ondansetron **OR** ondansetron (ZOFRAN) IV    Anti-infectives (From admission, onward)   Start     Dose/Rate Route Frequency Ordered Stop   12/10/18 1000  azithromycin (  ZITHROMAX) tablet 500 mg     500 mg Oral Daily 12/10/18 0827          Objective:   Vitals:   12/10/18 0530 12/10/18 0753 12/10/18 1318 12/10/18 1618  BP: (!) 159/82  135/82   Pulse: (!) 108  (!) 102   Resp: 16  18   Temp: 98.4 F (36.9 C)  97.8 F (36.6 C)   TempSrc: Oral  Oral   SpO2: 98% 97% 97% 96%  Weight:      Height:        Wt Readings from Last 3 Encounters:  12/09/18 83.8 kg  10/29/18 83.5 kg  09/07/18 85 kg     Intake/Output Summary (Last 24 hours) at 12/10/2018 1644 Last data filed at 12/10/2018 1127 Gross per 24 hour  Intake 539.69 ml  Output 1300 ml  Net -760.31 ml     Physical Exam Patient is examined  daily including today on 12/10/18 , exams remain the same as of yesterday except that has changed   Gen:- Awake Alert, dyspnea on exertion HEENT:- [place].AT, No sclera icterus [place] 2L/min Neck-Supple Neck,No JVD,.  Lungs-diminished in bases with scattered wheezes  CV- S1, S2 normal, regular  Abd-  +ve B.Sounds, Abd Soft, No tenderness,    Extremity/Skin:- No  edema, pedal pulses present  Psych-affect is appropriate, oriented x3 Neuro-baseline neuromuscular deficits, but no new focal deficits, no tremors   Data Review:   Micro Results No results found for this or any previous visit (from the past 240 hour(s)).  Radiology Reports Dg Chest Port 1 View  Result Date: 12/09/2018 CLINICAL DATA:  Cough EXAM: PORTABLE CHEST 1 VIEW COMPARISON:  10/27/2018 FINDINGS: There is hyperinflation of the lungs compatible with COPD. Bibasilar densities much improved since prior study, likely residual atelectasis. Heart is normal size. No effusions or acute bony abnormality. IMPRESSION: COPD.  Bibasilar atelectasis, improving since prior study. Electronically Signed   By: Charlett Nose M.D.   On: 12/09/2018 18:52     CBC Recent Labs  Lab 12/09/18 1916 12/10/18 0610  WBC 7.5 6.7  HGB 11.9* 11.5*  HCT 38.5* 36.6*  PLT 250 256  MCV 87.3 86.5  MCH 27.0 27.2  MCHC 30.9 31.4  RDW 13.9 14.1  LYMPHSABS 1.8  --   MONOABS 0.8  --   EOSABS 0.1  --   BASOSABS 0.0  --     Chemistries  Recent Labs  Lab 12/09/18 1916 12/10/18 0610  NA 137 135  K 4.1 4.7  CL 98 96*  CO2 29 25  GLUCOSE 202* 381*  BUN 8 11  CREATININE 0.99 0.95  CALCIUM 9.8 9.5  AST  --  17  ALT  --  18  ALKPHOS  --  74  BILITOT  --  0.6   ------------------------------------------------------------------------------------------------------------------ No results for input(s): CHOL, HDL, LDLCALC, TRIG, CHOLHDL, LDLDIRECT in the last 72 hours.  Lab Results  Component Value Date   HGBA1C 10.0 (H) 12/09/2018    ------------------------------------------------------------------------------------------------------------------ No results for input(s): TSH, T4TOTAL, T3FREE, THYROIDAB in the last 72 hours.  Invalid input(s): FREET3 ------------------------------------------------------------------------------------------------------------------ No results for input(s): VITAMINB12, FOLATE, FERRITIN, TIBC, IRON, RETICCTPCT in the last 72 hours.  Coagulation profile No results for input(s): INR, PROTIME in the last 168 hours.  No results for input(s): DDIMER in the last 72 hours.  Cardiac Enzymes Recent Labs  Lab 12/09/18 2113 12/10/18 0610 12/10/18 1243  TROPONINI 0.08* 0.08* 0.08*   ------------------------------------------------------------------------------------------------------------------    Component Value Date/Time  BNP 31.0 10/27/2018 0529     Shon Hale M.D on 12/10/2018 at 4:44 PM  Go to [email or web address] - for contact info  [facility] - Office  [phone]

## 2018-12-10 NOTE — Progress Notes (Signed)
Pt blood glucose is 404. MD made aware. Highest number of units on sliding scale given. Will re-check blood glucose in 45 minutes to an hour.

## 2018-12-10 NOTE — Progress Notes (Signed)
Patients current troponin is 0.08. previous troponin was 0.09. paged MD to make aware. No new orders at this time. Will continue to monitor throughout shift.

## 2018-12-11 ENCOUNTER — Observation Stay (HOSPITAL_BASED_OUTPATIENT_CLINIC_OR_DEPARTMENT_OTHER): Payer: Medicare HMO

## 2018-12-11 DIAGNOSIS — R0602 Shortness of breath: Secondary | ICD-10-CM

## 2018-12-11 DIAGNOSIS — J441 Chronic obstructive pulmonary disease with (acute) exacerbation: Secondary | ICD-10-CM | POA: Diagnosis not present

## 2018-12-11 LAB — ECHOCARDIOGRAM COMPLETE
Height: 74 in
Weight: 2955.2 oz

## 2018-12-11 LAB — GLUCOSE, CAPILLARY
GLUCOSE-CAPILLARY: 371 mg/dL — AB (ref 70–99)
Glucose-Capillary: 293 mg/dL — ABNORMAL HIGH (ref 70–99)

## 2018-12-11 MED ORDER — AZITHROMYCIN 500 MG PO TABS: ORAL_TABLET | ORAL | 0 refills | Status: DC

## 2018-12-11 MED ORDER — ONDANSETRON HCL 4 MG PO TABS: 4.0000 mg | ORAL_TABLET | Freq: Four times a day (QID) | ORAL | 0 refills | Status: DC | PRN

## 2018-12-11 MED ORDER — PREDNISONE 20 MG PO TABS: 20.0000 mg | ORAL_TABLET | Freq: Every day | ORAL | 0 refills | Status: DC

## 2018-12-11 MED ORDER — GUAIFENESIN ER 1200 MG PO TB12: 1.0000 | ORAL_TABLET | Freq: Two times a day (BID) | ORAL | 0 refills | Status: AC

## 2018-12-11 NOTE — Progress Notes (Signed)
*  PRELIMINARY RESULTS* Echocardiogram 2D Echocardiogram has been performed.  Stacey Drain 12/11/2018, 10:19 AM

## 2018-12-11 NOTE — Care Management Obs Status (Signed)
MEDICARE OBSERVATION STATUS NOTIFICATION   Patient Details  Name: Jeff Ray MRN: [id number] Date of Birth: [birth year]   Medicare Observation Status Notification Given:  Yes    Corey Harold 12/11/2018, 11:54 AM

## 2018-12-11 NOTE — Discharge Instructions (Signed)
1) you are taking prednisone so Avoid ibuprofen/Advil/Aleve/Motrin/Goody Powders/Naproxen/BC powders/Meloxicam/Diclofenac/Indomethacin and other Nonsteroidal anti-inflammatory medications as these will make you more likely to bleed and can cause stomach ulcers, can also cause Kidney problems.   2) take medications as prescribed  3) continue to use oxygen at 2 to 3 L/min  via Nasal cannula continuously  4) absolutely NO tobacco exposure due to your severe/advanced lung disease  5) risk of fire, self injury and death with smoking in the house with oxygen in use  6) you are taking Anoro Ellipta and Trelegy Ellipta----talk to your primary care physician about streamlining your bronchodilators to avoid duplication

## 2018-12-11 NOTE — Discharge Summary (Signed)
Jeff Ray, is a 63 y.o. male  DOB 1955/12/20  MRN [id number].  Admission date:  12/09/2018  Admitting Physician  Meredeth Ide, MD  Discharge Date:  12/11/2018   Primary MD  Gareth Morgan, MD  Recommendations for primary care physician for things to follow:   1) you are taking prednisone so Avoid ibuprofen/Advil/Aleve/Motrin/Goody Powders/Naproxen/BC powders/Meloxicam/Diclofenac/Indomethacin and other Nonsteroidal anti-inflammatory medications as these will make you more likely to bleed and can cause stomach ulcers, can also cause Kidney problems.   2) take medications as prescribed  3) continue to use oxygen at 2 to 3 L/min  via Nasal cannula continuously  4) absolutely NO tobacco exposure due to your severe/advanced lung disease  5) risk of fire, self injury and death with smoking in the house with oxygen in use  6) you are taking Anoro Ellipta and Trelegy Ellipta----talk to your primary care physician about streamlining your bronchodilators to avoid duplication  Admission Diagnosis  COPD with acute exacerbation (HCC) [J44.1]   Discharge Diagnosis  COPD with acute exacerbation (HCC) [J44.1]    Active Problems:   COPD exacerbation (HCC)      Past Medical History:  Diagnosis Date  . Anxiety   . Asthma   . COPD (chronic obstructive pulmonary disease) (HCC)   . Diabetes mellitus   . History of nuclear stress test 02/2016   low risk study  . Hypertension   . On home O2    2L N/C     Past Surgical History:  Procedure Laterality Date  . BACK SURGERY         HPI  from the history and physical done on the day of admission:      Jeff Ray  is a 63 y.o. male, with history of COPD, chronic hypoxic respiratory failure on 2 L of oxygen at home, diabetes mellitus type 2, hypertension, hyperlipidemia came to hospital with worsening shortness of breath.  Patient was discharged from hospital  on 10/30/2018 after treatment for COPD exacerbation and influenza A. Patient states that for past few days he has been coughing up green-colored phlegm.  Also has been coughing more than usual.  Patient complains of pain in the ribs from coughing. Denies nausea vomiting or diarrhea. Denies headache or blurred vision. Denies passing out.    Hospital Course:   Brief Summary 63 y.o.male,with history of COPD, chronic hypoxic respiratory failure on 2 L of oxygen at home, diabetes mellitus type 2, hypertension, hyperlipidemia came to hospital with worsening shortness of breath. Patient was discharged from hospital on 10/30/2018 after treatment for COPD exacerbation and influenza A, readmitted 12/09/2018 with shortness of breath and concerns about COPD exacerbation, but also noted to have borderline troponin elevation  Plan:- 1)Acute COPD Exacerbation- no definite pneumonia,  treated with IV Solu-Medrol , overall much improved, continue mucolytics, discharge on azithromycin and bronchodilators, c/n  supplemental oxygen as ordered.   2)DM2-A1c is 10.0 reflecting poor diabetic control, continue Amaryl and metformin, anticipate worsening hyperglycemia with steroids, drink plenty fluids  3)Elevated Troponin----troponin peaked at 0.09, current troponin levels consistent with his previous levels, patient is chest pain-free, findings not consistent with ACS,...  Treated with aspirin 81 mg daily, crestor , Coreg 3.125 mg twice daily,   echocardiogram to evaluate EF over 65% without significant regional wall motion normalities, no further cardiovascular intervention required at this time  Code Status : full  Family Communication:   na   Disposition Plan  : home  Consults  :  na  Discharge Condition: stable  Follow UP---PCP  Diet and Activity recommendation:  As advised  Discharge Instructions    Discharge Instructions    Call MD for:  difficulty breathing, headache or visual disturbances    Complete by:  As directed    Call MD for:  persistant dizziness or light-headedness   Complete by:  As directed    Call MD for:  persistant nausea and vomiting   Complete by:  As directed    Call MD for:  severe uncontrolled pain   Complete by:  As directed    Call MD for:  temperature >100.4   Complete by:  As directed    Diet - low sodium heart healthy   Complete by:  As directed    Discharge instructions   Complete by:  As directed    1) you are taking prednisone so Avoid ibuprofen/Advil/Aleve/Motrin/Goody Powders/Naproxen/BC powders/Meloxicam/Diclofenac/Indomethacin and other Nonsteroidal anti-inflammatory medications as these will make you more likely to bleed and can cause stomach ulcers, can also cause Kidney problems.   2) take medications as prescribed  3) continue to use oxygen at 2 to 3 L/min  via Nasal cannula continuously  4) absolutely NO tobacco exposure due to your severe/advanced lung disease  5) risk of fire, self injury and death with smoking in the house with oxygen in use  6) you are taking Anoro Ellipta and Trelegy Ellipta----talk to your primary care physician about streamlining your bronchodilators to avoid duplication   Increase activity slowly   Complete by:  As directed       Discharge Medications     Allergies as of 12/11/2018   No Known Allergies     Medication List    STOP taking these medications   ibuprofen 800 MG tablet Commonly known as:  ADVIL,MOTRIN     TAKE these medications   alfuzosin 10 MG 24 hr tablet Commonly known as:  UROXATRAL Take 10 mg by mouth at bedtime.   ALPRAZolam 1 MG tablet Commonly known as:  XANAX Take 1 mg by mouth 3 (three) times daily as needed for anxiety.   Anoro Ellipta 62.5-25 MCG/INH Aepb Generic drug:  umeclidinium-vilanterol Take 1 puff by mouth daily.   azithromycin 500 MG tablet Commonly known as:  ZITHROMAX 1 tab daily Start taking on:  December 12, 2018   CENTRUM SILVER 50+MEN PO Take 1  tablet by mouth daily.   fexofenadine-pseudoephedrine 180-240 MG 24 hr tablet Commonly known as:  ALLEGRA-D 24 Take 1 tablet by mouth daily.   finasteride 5 MG tablet Commonly known as:  PROSCAR Take 5 mg by mouth daily.   fluticasone 50 MCG/ACT nasal spray Commonly known as:  FLONASE Place 2 sprays into both nostrils daily.   glimepiride 2 MG tablet Commonly known as:  AMARYL Take 2 mg by mouth daily with breakfast.   Guaifenesin 1200 MG Tb12 Commonly known as:  Mucinex Maximum Strength Take 1 tablet (1,200 mg total) by mouth 2 (two) times daily.   ipratropium-albuterol 0.5-2.5 (3) MG/3ML  Soln Commonly known as:  DUONEB Take 3 mLs by nebulization every 4 (four) hours as needed. For shortness of breath   metFORMIN 500 MG 24 hr tablet Commonly known as:  GLUCOPHAGE-XR Take 500 mg by mouth 2 (two) times daily.   montelukast 10 MG tablet Commonly known as:  SINGULAIR Take 10 mg by mouth every morning.   ondansetron 4 MG tablet Commonly known as:  ZOFRAN Take 1 tablet (4 mg total) by mouth every 6 (six) hours as needed for nausea.   oxybutynin 5 MG tablet Commonly known as:  DITROPAN Take 5 mg by mouth 3 (three) times daily.   pantoprazole 40 MG tablet Commonly known as:  PROTONIX Take 40 mg by mouth every morning.   predniSONE 20 MG tablet Commonly known as:  Deltasone Take 1 tablet (20 mg total) by mouth daily with breakfast. What changed:    how much to take  how to take this  when to take this  additional instructions   ProAir HFA 108 (90 Base) MCG/ACT inhaler Generic drug:  albuterol Inhale 2 puffs into the lungs every 6 (six) hours as needed for wheezing or shortness of breath. Shortness of Breath   rosuvastatin 40 MG tablet Commonly known as:  CRESTOR Take 40 mg by mouth at bedtime.   Trelegy Ellipta 100-62.5-25 MCG/INH Aepb Generic drug:  Fluticasone-Umeclidin-Vilant Take 2 puffs by mouth daily.   triamcinolone cream 0.5 % Commonly known as:   KENALOG Apply 1 application topically 3 (three) times daily. Applied to hands       Major procedures and Radiology Reports - PLEASE review detailed and final reports for all details, in brief -   Dg Chest Port 1 View  Result Date: 12/09/2018 CLINICAL DATA:  Cough EXAM: PORTABLE CHEST 1 VIEW COMPARISON:  10/27/2018 FINDINGS: There is hyperinflation of the lungs compatible with COPD. Bibasilar densities much improved since prior study, likely residual atelectasis. Heart is normal size. No effusions or acute bony abnormality. IMPRESSION: COPD.  Bibasilar atelectasis, improving since prior study. Electronically Signed   By: Charlett Nose M.D.   On: 12/09/2018 18:52    Today   Subjective    Jeff Ray today has no new concerns, shortness of breath wheezing and cough is improved, no fevers          Patient has been seen and examined prior to discharge   Objective   Blood pressure 138/77, pulse 90, temperature 97.7 F (36.5 C), temperature source Oral, resp. rate (!) 21, height 6\' 2"  (1.88 m), weight 83.8 kg, SpO2 99 %.   Intake/Output Summary (Last 24 hours) at 12/11/2018 1433 Last data filed at 12/11/2018 1400 Gross per 24 hour  Intake 720 ml  Output 975 ml  Net -255 ml   Exam  Gen:- Awake Alert, no conversational dyspnea  HEENT:- [place].AT, No sclera icterus [place]--[place] 2L/min Neck-Supple Neck,No JVD,.  Lungs-improving air movement, no wheezing  CV- S1, S2 normal, regular  Abd-  +ve B.Sounds, Abd Soft, No tenderness,    Extremity/Skin:- No  edema, pedal pulses present  Psych-affect is appropriate, oriented x3 Neuro-baseline neuromuscular deficits, but no new focal deficits, no tremors   Data Review   CBC w Diff:  Lab Results  Component Value Date   WBC 6.7 12/10/2018   HGB 11.5 (L) 12/10/2018   HCT 36.6 (L) 12/10/2018   PLT 256 12/10/2018   LYMPHOPCT 25 12/09/2018   MONOPCT 11 12/09/2018   EOSPCT 2 12/09/2018   BASOPCT 0 12/09/2018  CMP:  Lab Results  Component  Value Date   NA 135 12/10/2018   K 4.7 12/10/2018   CL 96 (L) 12/10/2018   CO2 25 12/10/2018   BUN 11 12/10/2018   CREATININE 0.95 12/10/2018   PROT 7.3 12/10/2018   ALBUMIN 3.5 12/10/2018   BILITOT 0.6 12/10/2018   ALKPHOS 74 12/10/2018   AST 17 12/10/2018   ALT 18 12/10/2018    Total Discharge time is about 33 minutes  Jeff Ray M.D on 12/11/2018 at 2:33 PM  Go to [email or web address] -  for contact info  [facility] - Office  [phone]

## 2019-01-09 ENCOUNTER — Inpatient Hospital Stay (HOSPITAL_COMMUNITY): Payer: Medicare HMO

## 2019-01-09 ENCOUNTER — Other Ambulatory Visit: Payer: Self-pay

## 2019-01-09 ENCOUNTER — Inpatient Hospital Stay (HOSPITAL_COMMUNITY)
Admission: EM | Admit: 2019-01-09 | Discharge: 2019-01-19 | DRG: 208 | Disposition: A | Discharge status: Home Health | Payer: Medicare HMO | Attending: Internal Medicine | Admitting: Internal Medicine

## 2019-01-09 ENCOUNTER — Emergency Department (HOSPITAL_COMMUNITY): Payer: Medicare HMO

## 2019-01-09 DIAGNOSIS — G9341 Metabolic encephalopathy: Secondary | ICD-10-CM | POA: Diagnosis present

## 2019-01-09 DIAGNOSIS — J189 Pneumonia, unspecified organism: Secondary | ICD-10-CM | POA: Diagnosis not present

## 2019-01-09 DIAGNOSIS — K219 Gastro-esophageal reflux disease without esophagitis: Secondary | ICD-10-CM | POA: Diagnosis present

## 2019-01-09 DIAGNOSIS — J181 Lobar pneumonia, unspecified organism: Secondary | ICD-10-CM | POA: Diagnosis not present

## 2019-01-09 DIAGNOSIS — Z7951 Long term (current) use of inhaled steroids: Secondary | ICD-10-CM | POA: Diagnosis not present

## 2019-01-09 DIAGNOSIS — B348 Other viral infections of unspecified site: Secondary | ICD-10-CM | POA: Diagnosis present

## 2019-01-09 DIAGNOSIS — E785 Hyperlipidemia, unspecified: Secondary | ICD-10-CM | POA: Diagnosis present

## 2019-01-09 DIAGNOSIS — E782 Mixed hyperlipidemia: Secondary | ICD-10-CM

## 2019-01-09 DIAGNOSIS — R0602 Shortness of breath: Secondary | ICD-10-CM

## 2019-01-09 DIAGNOSIS — R6889 Other general symptoms and signs: Secondary | ICD-10-CM

## 2019-01-09 DIAGNOSIS — Z79899 Other long term (current) drug therapy: Secondary | ICD-10-CM

## 2019-01-09 DIAGNOSIS — Y95 Nosocomial condition: Secondary | ICD-10-CM | POA: Diagnosis present

## 2019-01-09 DIAGNOSIS — Z7984 Long term (current) use of oral hypoglycemic drugs: Secondary | ICD-10-CM

## 2019-01-09 DIAGNOSIS — J9622 Acute and chronic respiratory failure with hypercapnia: Secondary | ICD-10-CM | POA: Diagnosis present

## 2019-01-09 DIAGNOSIS — Z833 Family history of diabetes mellitus: Secondary | ICD-10-CM

## 2019-01-09 DIAGNOSIS — E875 Hyperkalemia: Secondary | ICD-10-CM | POA: Diagnosis present

## 2019-01-09 DIAGNOSIS — D72825 Bandemia: Secondary | ICD-10-CM | POA: Diagnosis not present

## 2019-01-09 DIAGNOSIS — Z8701 Personal history of pneumonia (recurrent): Secondary | ICD-10-CM

## 2019-01-09 DIAGNOSIS — F419 Anxiety disorder, unspecified: Secondary | ICD-10-CM | POA: Diagnosis present

## 2019-01-09 DIAGNOSIS — E1111 Type 2 diabetes mellitus with ketoacidosis with coma: Secondary | ICD-10-CM

## 2019-01-09 DIAGNOSIS — J440 Chronic obstructive pulmonary disease with acute lower respiratory infection: Principal | ICD-10-CM | POA: Diagnosis present

## 2019-01-09 DIAGNOSIS — R0902 Hypoxemia: Secondary | ICD-10-CM

## 2019-01-09 DIAGNOSIS — Z87891 Personal history of nicotine dependence: Secondary | ICD-10-CM

## 2019-01-09 DIAGNOSIS — E876 Hypokalemia: Secondary | ICD-10-CM | POA: Diagnosis not present

## 2019-01-09 DIAGNOSIS — J9621 Acute and chronic respiratory failure with hypoxia: Secondary | ICD-10-CM | POA: Diagnosis present

## 2019-01-09 DIAGNOSIS — T380X5A Adverse effect of glucocorticoids and synthetic analogues, initial encounter: Secondary | ICD-10-CM | POA: Diagnosis not present

## 2019-01-09 DIAGNOSIS — Z9981 Dependence on supplemental oxygen: Secondary | ICD-10-CM | POA: Diagnosis not present

## 2019-01-09 DIAGNOSIS — J441 Chronic obstructive pulmonary disease with (acute) exacerbation: Secondary | ICD-10-CM | POA: Diagnosis present

## 2019-01-09 DIAGNOSIS — E1165 Type 2 diabetes mellitus with hyperglycemia: Secondary | ICD-10-CM | POA: Diagnosis present

## 2019-01-09 DIAGNOSIS — Z20828 Contact with and (suspected) exposure to other viral communicable diseases: Secondary | ICD-10-CM | POA: Diagnosis present

## 2019-01-09 DIAGNOSIS — R0600 Dyspnea, unspecified: Secondary | ICD-10-CM

## 2019-01-09 DIAGNOSIS — N400 Enlarged prostate without lower urinary tract symptoms: Secondary | ICD-10-CM

## 2019-01-09 DIAGNOSIS — Y92230 Patient room in hospital as the place of occurrence of the external cause: Secondary | ICD-10-CM | POA: Diagnosis not present

## 2019-01-09 DIAGNOSIS — D72828 Other elevated white blood cell count: Secondary | ICD-10-CM | POA: Diagnosis not present

## 2019-01-09 DIAGNOSIS — I452 Bifascicular block: Secondary | ICD-10-CM | POA: Diagnosis present

## 2019-01-09 DIAGNOSIS — J9690 Respiratory failure, unspecified, unspecified whether with hypoxia or hypercapnia: Secondary | ICD-10-CM

## 2019-01-09 LAB — BLOOD GAS, ARTERIAL
Acid-base deficit: 0 mmol/L (ref 0.0–2.0)
Bicarbonate: 23.7 mmol/L (ref 20.0–28.0)
FIO2: 40
O2 Saturation: 95.1 %
Patient temperature: 36.4
pCO2 arterial: 50.5 mmHg — ABNORMAL HIGH (ref 32.0–48.0)
pH, Arterial: 7.321 — ABNORMAL LOW (ref 7.350–7.450)
pO2, Arterial: 91.2 mmHg (ref 83.0–108.0)

## 2019-01-09 LAB — CBC
HCT: 43.3 % (ref 39.0–52.0)
HCT: 38.7 % — ABNORMAL LOW (ref 39.0–52.0)
Hemoglobin: 13.5 g/dL (ref 13.0–17.0)
Hemoglobin: 11.7 g/dL — ABNORMAL LOW (ref 13.0–17.0)
MCH: 27.3 pg (ref 26.0–34.0)
MCH: 26.4 pg (ref 26.0–34.0)
MCHC: 31.2 g/dL (ref 30.0–36.0)
MCHC: 30.2 g/dL (ref 30.0–36.0)
MCV: 87.7 fL (ref 80.0–100.0)
MCV: 87.2 fL (ref 80.0–100.0)
Platelets: 528 10*3/uL — ABNORMAL HIGH (ref 150–400)
Platelets: 426 10*3/uL — ABNORMAL HIGH (ref 150–400)
RBC: 4.94 MIL/uL (ref 4.22–5.81)
RBC: 4.44 MIL/uL (ref 4.22–5.81)
RDW: 14.1 % (ref 11.5–15.5)
RDW: 14.2 % (ref 11.5–15.5)
WBC: 25.9 10*3/uL — ABNORMAL HIGH (ref 4.0–10.5)
WBC: 24.4 10*3/uL — ABNORMAL HIGH (ref 4.0–10.5)
nRBC: 0 % (ref 0.0–0.2)
nRBC: 0 % (ref 0.0–0.2)

## 2019-01-09 LAB — URINALYSIS, ROUTINE W REFLEX MICROSCOPIC
Bilirubin Urine: NEGATIVE
Cellular Cast, UA: 28
Glucose, UA: >=500 mg/dL — AB
Hgb urine dipstick: NEGATIVE
Ketones, ur: 80 mg/dL — AB
Leukocytes,Ua: NEGATIVE
Nitrite: NEGATIVE
Protein, ur: NEGATIVE mg/dL
Specific Gravity, Urine: 1.028 (ref 1.005–1.030)
pH: 5 (ref 5.0–8.0)

## 2019-01-09 LAB — BASIC METABOLIC PANEL
Anion gap: 19 — ABNORMAL HIGH (ref 5–15)
Anion gap: 17 — ABNORMAL HIGH (ref 5–15)
BUN: 23 mg/dL (ref 8–23)
BUN: 22 mg/dL (ref 8–23)
CO2: 25 mmol/L (ref 22–32)
CO2: 23 mmol/L (ref 22–32)
Calcium: 10.1 mg/dL (ref 8.9–10.3)
Calcium: 9.7 mg/dL (ref 8.9–10.3)
Chloride: 88 mmol/L — ABNORMAL LOW (ref 98–111)
Chloride: 89 mmol/L — ABNORMAL LOW (ref 98–111)
Creatinine, Ser: 1.18 mg/dL (ref 0.61–1.24)
Creatinine, Ser: 1.27 mg/dL — ABNORMAL HIGH (ref 0.61–1.24)
GFR calc Af Amer: >60 mL/min (ref >60)
GFR calc Af Amer: >60 mL/min (ref >60)
GFR calc non Af Amer: >60 mL/min (ref >60)
GFR calc non Af Amer: 59 mL/min — ABNORMAL LOW (ref >60)
Glucose, Bld: 486 mg/dL — ABNORMAL HIGH (ref 70–99)
Glucose, Bld: 479 mg/dL — ABNORMAL HIGH (ref 70–99)
Potassium: 5.5 mmol/L — ABNORMAL HIGH (ref 3.5–5.1)
Potassium: 6.5 mmol/L (ref 3.5–5.1)
Sodium: 132 mmol/L — ABNORMAL LOW (ref 135–145)
Sodium: 129 mmol/L — ABNORMAL LOW (ref 135–145)

## 2019-01-09 LAB — TROPONIN I: Troponin I: 0.05 ng/mL (ref <0.03)

## 2019-01-09 LAB — BRAIN NATRIURETIC PEPTIDE: B Natriuretic Peptide: 110 pg/mL — ABNORMAL HIGH (ref 0.0–100.0)

## 2019-01-09 LAB — CBG MONITORING, ED
Glucose-Capillary: 445 mg/dL — ABNORMAL HIGH (ref 70–99)
Glucose-Capillary: 386 mg/dL — ABNORMAL HIGH (ref 70–99)
Glucose-Capillary: 455 mg/dL — ABNORMAL HIGH (ref 70–99)

## 2019-01-09 LAB — BLOOD GAS, VENOUS
Acid-Base Excess: 3.1 mmol/L — ABNORMAL HIGH (ref 0.0–2.0)
Bicarbonate: 24 mmol/L (ref 20.0–28.0)
FIO2: 40
O2 Saturation: 38.9 %
Patient temperature: 36.4
pCO2, Ven: 68.9 mmHg — ABNORMAL HIGH (ref 44.0–60.0)
pH, Ven: 7.256 (ref 7.250–7.430)
pO2, Ven: <31 mmHg — CL (ref 32.0–45.0)

## 2019-01-09 LAB — GLUCOSE, CAPILLARY: Glucose-Capillary: 470 mg/dL — ABNORMAL HIGH (ref 70–99)

## 2019-01-09 LAB — INFLUENZA PANEL BY PCR (TYPE A & B)
Influenza A By PCR: NEGATIVE
Influenza B By PCR: NEGATIVE

## 2019-01-09 LAB — LACTIC ACID, PLASMA
Lactic Acid, Venous: 2.5 mmol/L (ref 0.5–1.9)
Lactic Acid, Venous: 1.7 mmol/L (ref 0.5–1.9)

## 2019-01-09 LAB — TRIGLYCERIDES: Triglycerides: 177 mg/dL — ABNORMAL HIGH (ref <150)

## 2019-01-09 LAB — D-DIMER, QUANTITATIVE: D-Dimer, Quant: 7.6 ug/mL-FEU — ABNORMAL HIGH (ref 0.00–0.50)

## 2019-01-09 LAB — SEDIMENTATION RATE: Sed Rate: 80 mm/hr — ABNORMAL HIGH (ref 0–16)

## 2019-01-09 LAB — C-REACTIVE PROTEIN: CRP: 34 mg/dL — ABNORMAL HIGH (ref <1.0)

## 2019-01-09 LAB — LACTATE DEHYDROGENASE: LDH: 153 U/L (ref 98–192)

## 2019-01-09 MED ORDER — PROPOFOL 1000 MG/100ML IV EMUL: 0.0000 ug/kg/min | INTRAVENOUS | Status: DC

## 2019-01-09 MED ORDER — ASPIRIN 81 MG PO CHEW
324.0000 mg | CHEWABLE_TABLET | ORAL | Status: AC
  Administered 2019-01-09: 21:00:00 324 mg via ORAL
  Filled 2019-01-09: qty 4

## 2019-01-09 MED ORDER — VANCOMYCIN HCL IN DEXTROSE 1-5 GM/200ML-% IV SOLN
1000.0000 mg | Freq: Once | INTRAVENOUS | Status: AC
  Administered 2019-01-09: 1000 mg via INTRAVENOUS
  Filled 2019-01-09: qty 200

## 2019-01-09 MED ORDER — ENOXAPARIN SODIUM 40 MG/0.4ML ~~LOC~~ SOLN
40.0000 mg | SUBCUTANEOUS | Status: DC
  Administered 2019-01-09 – 2019-01-18 (×10): 40 mg via SUBCUTANEOUS
  Filled 2019-01-09 (×11): qty 0.4

## 2019-01-09 MED ORDER — SODIUM CHLORIDE 0.9 % IV SOLN
INTRAVENOUS | Status: DC
  Administered 2019-01-09: 21:00:00 via INTRAVENOUS

## 2019-01-09 MED ORDER — SODIUM CHLORIDE 0.9 % IV SOLN: INTRAVENOUS | Status: DC

## 2019-01-09 MED ORDER — METHYLPREDNISOLONE SODIUM SUCC 125 MG IJ SOLR
125.0000 mg | Freq: Once | INTRAMUSCULAR | Status: AC
  Administered 2019-01-09: 125 mg via INTRAVENOUS
  Filled 2019-01-09: qty 2

## 2019-01-09 MED ORDER — IPRATROPIUM-ALBUTEROL 0.5-2.5 (3) MG/3ML IN SOLN: 3.0000 mL | Freq: Four times a day (QID) | RESPIRATORY_TRACT | Status: DC

## 2019-01-09 MED ORDER — ONDANSETRON HCL 4 MG/2ML IJ SOLN: 4.0000 mg | Freq: Four times a day (QID) | INTRAMUSCULAR | Status: DC | PRN

## 2019-01-09 MED ORDER — INSULIN ASPART 100 UNIT/ML ~~LOC~~ SOLN
SUBCUTANEOUS | Status: AC
  Filled 2019-01-09: qty 1

## 2019-01-09 MED ORDER — PROPOFOL 1000 MG/100ML IV EMUL
5.0000 ug/kg/min | INTRAVENOUS | Status: DC
  Administered 2019-01-09: 5 ug/kg/min via INTRAVENOUS
  Administered 2019-01-09 – 2019-01-10 (×2): 45 ug/kg/min via INTRAVENOUS
  Filled 2019-01-09 (×2): qty 100

## 2019-01-09 MED ORDER — SODIUM BICARBONATE 8.4 % IV SOLN
100.0000 meq | Freq: Once | INTRAVENOUS | Status: AC
  Administered 2019-01-09: 100 meq via INTRAVENOUS
  Filled 2019-01-09: qty 50

## 2019-01-09 MED ORDER — ENOXAPARIN SODIUM 40 MG/0.4ML ~~LOC~~ SOLN
40.0000 mg | SUBCUTANEOUS | Status: DC
  Filled 2019-01-09: qty 0.4

## 2019-01-09 MED ORDER — ALBUTEROL SULFATE (2.5 MG/3ML) 0.083% IN NEBU: 2.5000 mg | INHALATION_SOLUTION | RESPIRATORY_TRACT | Status: DC | PRN

## 2019-01-09 MED ORDER — INSULIN REGULAR(HUMAN) IN NACL 100-0.9 UT/100ML-% IV SOLN
INTRAVENOUS | Status: DC
  Administered 2019-01-09: 4.1 [IU]/h via INTRAVENOUS
  Administered 2019-01-10: 5.4 [IU]/h via INTRAVENOUS
  Filled 2019-01-09 (×4): qty 100

## 2019-01-09 MED ORDER — VANCOMYCIN HCL IN DEXTROSE 750-5 MG/150ML-% IV SOLN
750.0000 mg | Freq: Two times a day (BID) | INTRAVENOUS | Status: DC
  Administered 2019-01-10: 750 mg via INTRAVENOUS
  Filled 2019-01-09 (×2): qty 150

## 2019-01-09 MED ORDER — KETAMINE HCL 10 MG/ML IJ SOLN
100.0000 mg | Freq: Once | INTRAMUSCULAR | Status: DC
  Filled 2019-01-09: qty 10

## 2019-01-09 MED ORDER — IPRATROPIUM-ALBUTEROL 0.5-2.5 (3) MG/3ML IN SOLN
3.0000 mL | Freq: Once | RESPIRATORY_TRACT | Status: AC
  Administered 2019-01-09: 3 mL via RESPIRATORY_TRACT
  Filled 2019-01-09: qty 3

## 2019-01-09 MED ORDER — ENOXAPARIN SODIUM 40 MG/0.4ML ~~LOC~~ SOLN: 40.0000 mg | SUBCUTANEOUS | Status: DC

## 2019-01-09 MED ORDER — SODIUM CHLORIDE 0.9 % IV SOLN
2.0000 g | Freq: Once | INTRAVENOUS | Status: AC
  Administered 2019-01-09: 2 g via INTRAVENOUS
  Filled 2019-01-09: qty 20

## 2019-01-09 MED ORDER — INSULIN ASPART 100 UNIT/ML IV SOLN
10.0000 [IU] | Freq: Once | INTRAVENOUS | Status: AC
  Administered 2019-01-09: 10 [IU] via INTRAVENOUS

## 2019-01-09 MED ORDER — PROPOFOL 1000 MG/100ML IV EMUL
INTRAVENOUS | Status: AC
  Administered 2019-01-09: 5 ug/kg/min via INTRAVENOUS
  Filled 2019-01-09: qty 100

## 2019-01-09 MED ORDER — DEXTROSE-NACL 5-0.45 % IV SOLN
INTRAVENOUS | Status: DC
  Administered 2019-01-10: 06:00:00 via INTRAVENOUS

## 2019-01-09 MED ORDER — IPRATROPIUM-ALBUTEROL 20-100 MCG/ACT IN AERS: 1.0000 | INHALATION_SPRAY | Freq: Four times a day (QID) | RESPIRATORY_TRACT | Status: DC

## 2019-01-09 MED ORDER — SODIUM CHLORIDE 0.9 % IV BOLUS
500.0000 mL | Freq: Once | INTRAVENOUS | Status: AC
  Administered 2019-01-09: 500 mL via INTRAVENOUS

## 2019-01-09 MED ORDER — ASPIRIN 300 MG RE SUPP: 300.0000 mg | RECTAL | Status: AC

## 2019-01-09 MED ORDER — ROCURONIUM BROMIDE 50 MG/5ML IV SOLN
80.0000 mg | Freq: Once | INTRAVENOUS | Status: AC
  Administered 2019-01-09: 80 mg via INTRAVENOUS

## 2019-01-09 MED ORDER — FENTANYL CITRATE (PF) 100 MCG/2ML IJ SOLN
100.0000 ug | INTRAMUSCULAR | Status: DC | PRN
  Filled 2019-01-09: qty 2

## 2019-01-09 MED ORDER — FENTANYL CITRATE (PF) 100 MCG/2ML IJ SOLN: 100.0000 ug | INTRAMUSCULAR | Status: DC | PRN

## 2019-01-09 MED ORDER — ALBUTEROL SULFATE HFA 108 (90 BASE) MCG/ACT IN AERS
2.0000 | INHALATION_SPRAY | RESPIRATORY_TRACT | Status: DC | PRN
  Filled 2019-01-09: qty 6.7

## 2019-01-09 MED ORDER — SODIUM CHLORIDE 0.9 % IV SOLN
2.0000 g | Freq: Two times a day (BID) | INTRAVENOUS | Status: AC
  Administered 2019-01-09 – 2019-01-15 (×13): 2 g via INTRAVENOUS
  Filled 2019-01-09 (×16): qty 2

## 2019-01-09 MED ORDER — SODIUM CHLORIDE 0.9 % IV SOLN
INTRAVENOUS | Status: DC
  Administered 2019-01-10: 11:00:00 via INTRAVENOUS

## 2019-01-09 MED ORDER — PANTOPRAZOLE SODIUM 40 MG IV SOLR
40.0000 mg | Freq: Every day | INTRAVENOUS | Status: DC
  Administered 2019-01-09 – 2019-01-10 (×2): 40 mg via INTRAVENOUS
  Filled 2019-01-09 (×2): qty 40

## 2019-01-09 MED ORDER — SODIUM CHLORIDE 0.9 % IV SOLN
500.0000 mg | INTRAVENOUS | Status: DC
  Administered 2019-01-09: 500 mg via INTRAVENOUS
  Filled 2019-01-09 (×2): qty 500

## 2019-01-09 MED ORDER — KETAMINE HCL 50 MG/ML IJ SOLN
INTRAMUSCULAR | Status: AC
  Administered 2019-01-09: 100 mg
  Filled 2019-01-09: qty 10

## 2019-01-09 MED ORDER — CALCIUM GLUCONATE-NACL 1-0.675 GM/50ML-% IV SOLN
1.0000 g | Freq: Once | INTRAVENOUS | Status: AC
  Administered 2019-01-09: 1000 mg via INTRAVENOUS
  Filled 2019-01-09: qty 50

## 2019-01-09 MED ORDER — SODIUM POLYSTYRENE SULFONATE 15 GM/60ML PO SUSP
30.0000 g | Freq: Once | ORAL | Status: AC
  Administered 2019-01-09: 30 g
  Filled 2019-01-09: qty 120

## 2019-01-09 MED ORDER — IPRATROPIUM-ALBUTEROL 0.5-2.5 (3) MG/3ML IN SOLN
3.0000 mL | Freq: Four times a day (QID) | RESPIRATORY_TRACT | Status: DC
  Administered 2019-01-09 – 2019-01-10 (×4): 3 mL via RESPIRATORY_TRACT
  Filled 2019-01-09 (×4): qty 3

## 2019-01-09 MED ORDER — ACETAMINOPHEN 325 MG PO TABS: 650.0000 mg | ORAL_TABLET | ORAL | Status: DC | PRN

## 2019-01-09 NOTE — Progress Notes (Signed)
eLink Physician-Brief Progress Note Patient Name: Jeff Ray DOB: 1956/09/06 MRN: [id number]   Date of Service  01/09/2019  HPI/Events of Note  Hyperglycemia/DKA - Blood glucose = 470 and urine ketones = 80. Clinical picture c/w DKA.  eICU Interventions  Will initiate DKA insulin IV therapy protocol.      Intervention Category Major Interventions: Hyperglycemia - active titration of insulin therapy  Lenell Antu 01/09/2019, 8:18 PM

## 2019-01-09 NOTE — ED Notes (Signed)
Unable to contact wife.  Permission given by 2nd emergency contact, Servando Salina, to transfer pt to [facility].  Witnessed by [facility] RN

## 2019-01-09 NOTE — Consult Note (Signed)
NAME:  Jeff Ray, MRN:  [id number], DOB:  1955-12-07, LOS: 0 ADMISSION DATE:  01/09/2019, CONSULTATION DATE:  01/09/19 REFERRING MD:  [facility]  CHIEF COMPLAINT:  SOB   Brief History   Jeff Ray is a 63 y.o. male who was admitted to [facility] with acute on chronic hypoxic and hypercapnic respiratory failure, likely due to COPD exacerbation (has had mutliple admissions for this in the past).  Was deemed to be a COVID-19 rule out per ID; therefore, required intubation prior to transfer to [facility] (was on BiPAP prior).  History of present illness   Pt is encephelopathic; therefore, this HPI is obtained from chart review. Jeff Ray is a 63 y.o. male who has a PMH as outlined below including but not limited to chronic hypoxic respiratory failure on 2L O2, COPD, DM, HTN, BPH (see "past medical history").  Of note, he has had repeated admissions for COPD exacerbations.   He presented to [facility] ED 4/7 with SOB that had progressed over the prior 5 days.  He does have chronic cough productive of clear sputum and this had not changed.  Denied any exposures to known sick contacts.  In ED, he was noted to be hypercapnic and was placed on BiPAP.  CXR was concerning for PNA.  Case discussed with ID who recommended COVID-19 testing.  Due to him being on BiPAP and not able to transfer to [facility] (due to COVID / aerosolization), he was ultimately intubated.  After arrival to [facility], he was found to be hyperglycemic with ketones noted in urine.  He was subsequently started on DKA protocol.  Past Medical History  COPD on chronic 2L O2, HTN, DM, anxiety.  Significant Hospital Events   4/7 > admit.  Consults:  None.  Procedures:  ETT 4/7 >   Significant Diagnostic Tests:  CXR 4/7 > multifocal PNA   Micro Data:  Blood 4/7 >  Sputum 4/7 >  RVP 4/7 >  COVID-19 4/7 >  Urine strep 4/7 >  Urine legionella 4/7 >   Antimicrobials:  Vanc 4/7 >  Cefepime 4/7 >    Interim history/subjective:  Comfortable on vent. PRVC  560 / 18 / 80% / 5.  Objective:  Blood pressure (!) 152/82, pulse (!) 104, temperature (!) 97.4 F (36.3 C), temperature source Oral, resp. rate 18, height  (1.753 m), weight 77.5 kg, SpO2 100 %.    Vent Mode: PRVC FiO2 (%):  [40 %-100 %] 80 % Set Rate:  [18 bmp] 18 bmp Vt Set:  [450 mL-560 mL] 560 mL PEEP:  [5 cmH20] 5 cmH20 Plateau Pressure:  [10 cmH20-13 cmH20] 13 cmH20   Intake/Output Summary (Last 24 hours) at 01/09/2019 2027 Last data filed at 01/09/2019 1816 Gross per 24 hour  Intake 253.62 ml  Output 200 ml  Net 53.62 ml   Filed Weights   01/09/19 1153 01/09/19 1954  Weight: 68 kg 77.5 kg    Examination (performed from door with assistance of RN who is in the room): General: Adult male, appears comfortable and in NAD. Neuro: Sedated on 45mcg/kg/min of propofol, minimally responsive. HEENT: [place]/AT. Sclerae anicteric.  ETT in place. Cardiovascular: RRR, no M/R/G.  Lungs: Respirations even and unlabored.  Diminished bilaterally, coarse left base. Abdomen: BS x 4, soft, NT/ND.  Musculoskeletal: No gross deformities, no edema.  Skin: Intact, cool, no rashes.  Assessment & Plan:   Respiratory insufficiency - initially on BiPAP for hypercapnia, but required intubation prior to transfer from  [facility] (unable to transport on BiPAP due to aerosolization of possible COVID). - Full vent support. - Assess ABG and adjust vent accordingly. - Wean as able. - Daily SBT. - Bronchial hygiene. - Follow CXR. - F/u on COVID testing, RVP.  Probable HCAP. - Continue empiric abx. - Follow cultures.  Presumed AECOPD. - Continue BD's. - Empiric abx as above.  DKA. - Continue fluids / insulin per DKA protocol. - Hold preadmission glimepiride, metformin.   Best Practice:  Diet: NPO. Pain/Anxiety/Delirium protocol (if indicated): Propofol gtt / Fentanyl PRN. VAP protocol (if indicated): In place. DVT prophylaxis: SCD's / Lovenox. GI prophylaxis: PPI. Glucose control: DKA  protocol. Mobility: Bedrest. Code Status: Full. Family Communication: None available. Disposition: ICU.  Labs   CBC: Recent Labs  Lab 01/09/19 1206  WBC 25.9*  HGB 13.5  HCT 43.3  MCV 87.7  PLT 528*   Basic Metabolic Panel: Recent Labs  Lab 01/09/19 1206  NA 132*  K 5.5*  CL 88*  CO2 25  GLUCOSE 486*  BUN 23  CREATININE 1.18  CALCIUM 10.1   GFR: Estimated Creatinine Clearance: 63.2 mL/min (by C-G formula based on SCr of 1.18 mg/dL). Recent Labs  Lab 01/09/19 1206 01/09/19 1300 01/09/19 1720  WBC 25.9*  --   --   LATICACIDVEN  --  2.5* 1.7   Liver Function Tests: No results for input(s): AST, ALT, ALKPHOS, BILITOT, PROT, ALBUMIN in the last 168 hours. No results for input(s): LIPASE, AMYLASE in the last 168 hours. No results for input(s): AMMONIA in the last 168 hours. ABG    Component Value Date/Time   PHART 7.321 (L) 01/09/2019 1415   PCO2ART 50.5 (H) 01/09/2019 1415   PO2ART 91.2 01/09/2019 1415   HCO3 23.7 01/09/2019 1415   TCO2 15.4 08/03/2015 1618   ACIDBASEDEF 0.0 01/09/2019 1415   O2SAT 95.1 01/09/2019 1415    Coagulation Profile: No results for input(s): INR, PROTIME in the last 168 hours. Cardiac Enzymes: Recent Labs  Lab 01/09/19 1206  TROPONINI 0.05*   HbA1C: Hgb A1c MFr Bld  Date/Time Value Ref Range Status  12/09/2018 07:16 PM 10.0 (H) 4.8 - 5.6 % Final    Comment:    (NOTE) Pre diabetes:          5.7%-6.4% Diabetes:              >6.4% Glycemic control for   <7.0% adults with diabetes   10/27/2018 05:29 AM 9.2 (H) 4.8 - 5.6 % Final    Comment:    (NOTE) Pre diabetes:          5.7%-6.4% Diabetes:              >6.4% Glycemic control for   <7.0% adults with diabetes    CBG: Recent Labs  Lab 01/09/19 1156 01/09/19 1455 01/09/19 1753 01/09/19 1958  GLUCAP 445* 386* 455* 470*    Review of Systems:   Unable to obtain as pt is encephalopathic.  Past medical history  He,  has a past medical history of Anxiety,  Asthma, COPD (chronic obstructive pulmonary disease) (HCC), Diabetes mellitus, History of nuclear stress test (02/2016), Hypertension, and On home O2.   Surgical History    Past Surgical History:  Procedure Laterality Date  . BACK SURGERY       Social History   reports that he quit smoking about 6 years ago. His smoking use included cigarettes. He smoked 0.00 packs per day for 10.00 years. He has never used smokeless tobacco.  He reports that he does not drink alcohol or use drugs.   Family history   His family history includes Diabetes in his mother.   Allergies No Known Allergies   Home meds  Prior to Admission medications   Medication Sig Start Date End Date Taking? Authorizing Provider  albuterol (PROAIR HFA) 108 (90 BASE) MCG/ACT inhaler Inhale 2 puffs into the lungs every 6 (six) hours as needed for wheezing or shortness of breath. Shortness of Breath   Yes [provider]  alfuzosin (UROXATRAL) 10 MG 24 hr tablet Take 10 mg by mouth at bedtime.    Yes [provider]  ALPRAZolam Prudy Feeler(XANAX) 1 MG tablet Take 1 mg by mouth 3 (three) times daily as needed for anxiety. 09/30/17  Yes [provider]  fexofenadine-pseudoephedrine (ALLEGRA-D 24) 180-240 MG 24 hr tablet Take 1 tablet by mouth daily.   Yes [provider]  fluticasone (FLONASE) 50 MCG/ACT nasal spray Place 2 sprays into both nostrils daily. 09/30/17  Yes [provider]  glimepiride (AMARYL) 2 MG tablet Take 2 mg by mouth daily with breakfast.   Yes [provider]  Guaifenesin (MUCINEX MAXIMUM STRENGTH) 1200 MG TB12 Take 1 tablet (1,200 mg total) by mouth 2 (two) times daily. 12/11/18  Yes Emokpae, Courage, MD  ipratropium-albuterol (DUONEB) 0.5-2.5 (3) MG/3ML SOLN Take 3 mLs by nebulization every 4 (four) hours as needed. For shortness of breath   Yes [provider]  metFORMIN (GLUCOPHAGE-XR) 500 MG 24 hr tablet Take 500 mg by mouth 2 (two) times daily.    Yes  [provider]  montelukast (SINGULAIR) 10 MG tablet Take 10 mg by mouth every morning.    Yes [provider]  Multiple Vitamins-Minerals (CENTRUM SILVER 50+MEN PO) Take 1 tablet by mouth daily.   Yes [provider]  oxybutynin (DITROPAN) 5 MG tablet Take 5 mg by mouth 3 (three) times daily.   Yes [provider]  pantoprazole (PROTONIX) 40 MG tablet Take 40 mg by mouth every morning.    Yes [provider]  rosuvastatin (CRESTOR) 40 MG tablet Take 40 mg by mouth at bedtime.  07/24/17  Yes [provider]  TRELEGY ELLIPTA 100-62.5-25 MCG/INH AEPB Take 2 puffs by mouth daily. 11/17/18  Yes [provider]  ondansetron (ZOFRAN) 4 MG tablet Take 1 tablet (4 mg total) by mouth every 6 (six) hours as needed for nausea. Patient not taking: Reported on 01/09/2019 12/11/18   Shon HaleEmokpae, Courage, MD  predniSONE (DELTASONE) 20 MG tablet Take 1 tablet (20 mg total) by mouth daily with breakfast. Patient not taking: Reported on 01/09/2019 12/11/18   Shon HaleEmokpae, Courage, MD    Critical care time: 40 min.    Rutherford Guysahul Mirko Tailor, PA Sidonie Dickens- C [place] [facility] Pager: [phone].  If no answer, [phone] 01/09/2019, 8:27 PM

## 2019-01-09 NOTE — ED Notes (Signed)
Pt lethargic. Is alert to painful stimuli

## 2019-01-09 NOTE — Progress Notes (Signed)
Pharmacy Antibiotic Note  Jeff Ray is a 63 y.o. male admitted on 01/09/2019 with pneumonia.  Pharmacy has been consulted for vancomycin and cefepime dosing.  Plan: Vancomycin 750mg  IV every 12 hours.  Goal trough 15-20 mcg/mL. cefepime 2gm iv q12h  Height: 5\' 9"  (175.3 cm) Weight: 150 lb (68 kg) IBW/kg (Calculated) : 70.7  Temp (24hrs), Avg:97.6 F (36.4 C), Min:97.6 F (36.4 C), Max:97.6 F (36.4 C)  Recent Labs  Lab 01/09/19 1206 01/09/19 1300  WBC 25.9*  --   CREATININE 1.18  --   LATICACIDVEN  --  2.5*    Estimated Creatinine Clearance: 60.8 mL/min (by C-G formula based on SCr of 1.18 mg/dL).    No Known Allergies  Antimicrobials this admission: 4/7 azithromycin x 1 4/7 ceftriaxone x 1 4/7cefepime >> 4/7 vancomycin >>  Microbiology results: 4/7 BCx: sent 4/7 MRSA PCR: ordered   Thank you for allowing pharmacy to be a part of this patient's care.  Gerre Pebbles Vanna Shavers 01/09/2019 4:56 PM

## 2019-01-09 NOTE — ED Triage Notes (Signed)
Spouse noted patient had decreased LOC this morning with some slurred speech that disappeared after patient was aroused.  History of COPD, emphysema.  Per EMS, patient CBG 581.  Patient with non productive cough, no reported fever.  Patient is on home oxygen 2 liters.

## 2019-01-09 NOTE — ED Provider Notes (Addendum)
[facility] Emergency Department Provider Note MRN:  [id number]  Arrival date & time: 01/09/19     Chief Complaint   Respiratory Distress   History of Present Illness   Jeff Ray is a 63 y.o. year-old male with a history of COPD presenting to the ED with chief complaint of respiratory distress.  EMS reported severe COPD exacerbation.  Worsening shortness of breath for the past 3 days.  Denies fever, no recent travel, no sick contacts.  Patient uses 2 L nasal cannula at home.  Patient has been under strict home isolation for the past several weeks to prevent any exposure to coronavirus.  I was unable to obtain an accurate HPI, PMH, or ROS due to the patient's respiratory distress.  Review of Systems  A complete 10 system review of systems was obtained and all systems are negative except as noted in the HPI and PMH.   Patient's Health History    Past Medical History:  Diagnosis Date  . Anxiety   . Asthma   . COPD (chronic obstructive pulmonary disease) (HCC)   . Diabetes mellitus   . History of nuclear stress test 02/2016   low risk study  . Hypertension   . On home O2    2L N/C     Past Surgical History:  Procedure Laterality Date  . BACK SURGERY      Family History  Problem Relation Age of Onset  . Diabetes Mother     Social History   Socioeconomic History  . Marital status: Married    Spouse name: Not on file  . Number of children: Not on file  . Years of education: Not on file  . Highest education level: Not on file  Occupational History  . Not on file  Social Needs  . Financial resource strain: Not on file  . Food insecurity:    Worry: Not on file    Inability: Not on file  . Transportation needs:    Medical: Not on file    Non-medical: Not on file  Tobacco Use  . Smoking status: Former Smoker    Packs/day: 0.00    Years: 10.00    Pack years: 0.00    Types: Cigarettes    Last attempt to quit: 10/20/2012    Years since  quitting: 6.2  . Smokeless tobacco: Never Used  Substance and Sexual Activity  . Alcohol use: No    Alcohol/week: 0.0 standard drinks  . Drug use: No  . Sexual activity: Yes  Lifestyle  . Physical activity:    Days per week: Not on file    Minutes per session: Not on file  . Stress: Not on file  Relationships  . Social connections:    Talks on phone: Not on file    Gets together: Not on file    Attends religious service: Not on file    Active member of club or organization: Not on file    Attends meetings of clubs or organizations: Not on file    Relationship status: Not on file  . Intimate partner violence:    Fear of current or ex partner: Not on file    Emotionally abused: Not on file    Physically abused: Not on file    Forced sexual activity: Not on file  Other Topics Concern  . Not on file  Social History Narrative  . Not on file     Physical Exam  Vital Signs and Nursing Notes reviewed  Vitals:   01/09/19 1646 01/09/19 1651  BP: 140/86 (!) 176/110  Pulse: (!) 106 (!) 110  Resp: (!) 35 (!) 23  Temp:    SpO2: 100% 100%    CONSTITUTIONAL: Ill-appearing, NAD NEURO: Somnolent but wakes to voice, moving all extremities EYES:  eyes equal and reactive ENT/NECK:  no LAD, no JVD CARDIO: Tachycardic rate, well-perfused, normal S1 and S2 PULM: Poor air movement throughout GI/GU:  normal bowel sounds, non-distended, non-tender MSK/SPINE:  No gross deformities, no edema SKIN:  no rash, atraumatic PSYCH:  Appropriate speech and behavior  Diagnostic and Interventional Summary    EKG Interpretation  Date/Time:  Tuesday January 09 2019 12:03:10 EDT Ventricular Rate:  113 PR Interval:    QRS Duration: 114 QT Interval:  339 QTC Calculation: 465 R Axis:   98 Text Interpretation:  Sinus tachycardia IRBBB and LPFB Confirmed by Kennis Carina [phone]) on 01/09/2019 12:05:33 PM      Labs Reviewed  CBC - Abnormal; Notable for the following components:      Result Value    WBC 25.9 (*)    Platelets 528 (*)    All other components within normal limits  BASIC METABOLIC PANEL - Abnormal; Notable for the following components:   Sodium 132 (*)    Potassium 5.5 (*)    Chloride 88 (*)    Glucose, Bld 486 (*)    Anion gap 19 (*)    All other components within normal limits  TROPONIN I - Abnormal; Notable for the following components:   Troponin I 0.05 (*)    All other components within normal limits  BRAIN NATRIURETIC PEPTIDE - Abnormal; Notable for the following components:   B Natriuretic Peptide 110.0 (*)    All other components within normal limits  BLOOD GAS, VENOUS - Abnormal; Notable for the following components:   pCO2, Ven 68.9 (*)    pO2, Ven <31.0 (*)    Acid-Base Excess 3.1 (*)    All other components within normal limits  LACTIC ACID, PLASMA - Abnormal; Notable for the following components:   Lactic Acid, Venous 2.5 (*)    All other components within normal limits  BLOOD GAS, ARTERIAL - Abnormal; Notable for the following components:   pH, Arterial 7.321 (*)    pCO2 arterial 50.5 (*)    All other components within normal limits  CBG MONITORING, ED - Abnormal; Notable for the following components:   Glucose-Capillary 445 (*)    All other components within normal limits  CBG MONITORING, ED - Abnormal; Notable for the following components:   Glucose-Capillary 386 (*)    All other components within normal limits  CULTURE, BLOOD (ROUTINE X 2)  CULTURE, BLOOD (ROUTINE X 2) W REFLEX TO ID PANEL  RESPIRATORY PANEL BY PCR  NOVEL CORONAVIRUS, NAA (HOSPITAL ORDER, SEND-OUT TO REF LAB)  MRSA PCR SCREENING  INFLUENZA PANEL BY PCR (TYPE A & B)  LACTIC ACID, PLASMA    DG Chest Port 1 View  Final Result    DG Chest Portable 1 View    (Results Pending)    Medications  azithromycin (ZITHROMAX) 500 mg in sodium chloride 0.9 % 250 mL IVPB (0 mg Intravenous Stopped 01/09/19 1458)  propofol (DIPRIVAN) 1000 MG/100ML infusion (has no administration in time  range)  ketamine (KETALAR) injection 100 mg (has no administration in time range)  vancomycin (VANCOCIN) IVPB 1000 mg/200 mL premix (has no administration in time range)  ceFEPIme (MAXIPIME) 2 g in sodium chloride 0.9 % 100  mL IVPB (has no administration in time range)  vancomycin (VANCOCIN) IVPB 750 mg/150 ml premix (has no administration in time range)  ipratropium-albuterol (DUONEB) 0.5-2.5 (3) MG/3ML nebulizer solution 3 mL (3 mLs Nebulization Given 01/09/19 1201)  ipratropium-albuterol (DUONEB) 0.5-2.5 (3) MG/3ML nebulizer solution 3 mL (3 mLs Nebulization Given 01/09/19 1201)  ipratropium-albuterol (DUONEB) 0.5-2.5 (3) MG/3ML nebulizer solution 3 mL (3 mLs Nebulization Given 01/09/19 1201)  methylPREDNISolone sodium succinate (SOLU-MEDROL) 125 mg/2 mL injection 125 mg (125 mg Intravenous Given 01/09/19 1203)  sodium chloride 0.9 % bolus 500 mL (0 mLs Intravenous Stopped 01/09/19 1348)  cefTRIAXone (ROCEPHIN) 2 g in sodium chloride 0.9 % 100 mL IVPB (0 g Intravenous Stopped 01/09/19 1341)  insulin aspart (novoLOG) injection 10 Units (10 Units Intravenous Given 01/09/19 1348)  ketamine (KETALAR) 50 MG/ML injection (50 mg  Given 01/09/19 1648)  rocuronium (ZEMURON) injection 80 mg (80 mg Intravenous Given 01/09/19 1648)     Procedure Name: Intubation Date/Time: 01/09/2019 5:03 PM Performed by: Sabas SousBero, Petronella Shuford M, MD Pre-anesthesia Checklist: Patient identified, Emergency Drugs available, Suction available, Patient being monitored and Timeout performed Preoxygenation: Pre-oxygenation with 100% oxygen (via Bipap) Induction Type: Rapid sequence and IV induction Laryngoscope Size: Glidescope and 3 Grade View: Grade I Tube size: 7.5 mm Number of attempts: 1 Airway Equipment and Method: Video-laryngoscopy Placement Confirmation: ETT inserted through vocal cords under direct vision,  Positive ETCO2 and Breath sounds checked- equal and bilateral Secured at: 23 cm Tube secured with: ETT holder Comments:  Uncomplicated airway using 100mg  ketamine and 80mg  rocuronium.      Emergency Ultrasound Study:   Angiocath insertion Performed by: Sabas SousMichael M Kinlee Garrison  Consent: Verbal consent obtained. Risks and benefits: risks, benefits and alternatives were discussed Immediately prior to procedure the correct patient, procedure, equipment, support staff and site/side marked as needed.  Indication: difficult IV access Preparation: Patient was prepped and draped in the usual sterile fashion. Vein Location: The right basilic vein was visualized during assessment for potential access sites and was found to be patent/ easily compressed with linear ultrasound.  The needle was visualized with real-time ultrasound and guided into the vein. Gauge: 20  Images were not saved due to the time sensitive nature of the situation. Normal blood return.  Patient tolerance: Patient tolerated the procedure well with no immediate complications.  Critical Care Critical Care Documentation Critical care time provided by me (excluding procedures): 44 minutes  Condition necessitating critical care: hypercarbic respiratory failure, severe COPD exacerbation  Components of critical care management: reviewing of prior records, laboratory and imaging interpretation, frequent re-examination and reassessment of vital signs, administration of multiple duo nebs, IV steroids, BiPAP, discussion with consulting services.    ED Course and Medical Decision Making  I have reviewed the triage vital signs and the nursing notes.  Pertinent labs & imaging results that were available during my care of the patient were reviewed by me and considered in my medical decision making (see below for details).  63 year old male with history of COPD presenting with shortness of breath, poor air movement, scattered wheezes consistent with COPD exacerbation.  Patient is somnolent, raising the concern for hypercapnia.  Patient is afebrile, with no travel  history, no exposures to put him at high risk for coronavirus.  I feel that patient would benefit from duo nebs, Solu-Medrol, and a trial of BiPAP.  I feel that BiPAP is and where the management option at this time because it will likely negate the need for intubation.  It is an International aid/development workeraerosolizing  procedure but felt to be low risk from a coronavirus standpoint.  Hypercarbic at 67, however patient appears to be slightly more comfortable.  Will repeat VBG after 1 hour of BiPAP.  Patient's clinical status and repeat VBG both reassuring, good improvement.  Given leukocytosis and evidence of pneumonia on chest x-ray, patient was code sepsis alerted.  Provided with CAP coverage.  Discussed with Dr. Allena Katz of the hospitalist service, who will evaluate to help confirm the appropriateness of patient staying here, avoiding transfer, avoiding intubation.  Hospitalist was able to consult colleagues and ICU doctors, who have determined that patient does warrant coronavirus testing, and therefore needs intubation and transferred to [facility] long.  Intubation performed as described above without issues.  Awaiting transport.  Elmer Sow. Pilar Plate, MD [facility] Emergency [facility] [email or web address]  Final Clinical Impressions(s) / ED Diagnoses     ICD-10-CM   1. COPD exacerbation (HCC) J44.1   2. SOB (shortness of breath) R06.02 DG Chest [facility] 1 View    DG Chest [place] 1 View  3. Community acquired pneumonia, unspecified laterality J18.9     ED Discharge Orders    None         Sabas Sous, MD 01/09/19 1615    Sabas Sous, MD 01/09/19 1708    Sabas Sous, MD 01/10/19 [phone]

## 2019-01-09 NOTE — Progress Notes (Signed)
eLink Physician-Brief Progress Note Patient Name: Jeff Ray DOB: [birth year] MRN: [id number]   Date of Service  01/09/2019  HPI/Events of Note  Hyperkalemia - K+ = 6.5 QRS not widened on bedside monitor. Already on an insulin IV infusion.   eICU Interventions  Will order: 1. NaHCO3 100 meq IV now.  2. Calcium gluconate 1 gm IV now.  3. Kayexalate 30 gm per tube now.  4. Repeat BMP at 2 AM.     Intervention Category Major Interventions: Electrolyte abnormality - evaluation and management  Sommer,Steven Eugene 01/09/2019, 10:17 PM

## 2019-01-09 NOTE — ED Notes (Signed)
Pt sleeping with equal rise and chest fall  

## 2019-01-09 NOTE — ED Notes (Signed)
Have slid patient up in bed and checked CBG. Pt resting with equal rise and chest fall

## 2019-01-09 NOTE — Progress Notes (Signed)
Inpatient Diabetes Program Recommendations  AACE/ADA: New Consensus Statement on Inpatient Glycemic Control (2015)  Target Ranges:  Prepandial:   less than 140 mg/dL      Peak postprandial:   less than 180 mg/dL (1-2 hours)      Critically ill patients:  140 - 180 mg/dL   Results for Jeff Ray, Jeff Ray (MRN [id number]) as of 01/09/2019 17:13  Ref. Range 01/09/2019 11:56 01/09/2019 14:55  Glucose-Capillary Latest Ref Range: 70 - 99 mg/dL 536 (H) 644 (H)    Review of Glycemic Control  Diabetes history: DM 2 Outpatient Diabetes medications: Amaryl 2 mg Daily, Metformin 1000 mg BID Current orders for Inpatient glycemic control: None  Inpatient Diabetes Program Recommendations:    A1c 10% on 12/09/2018  Based on current glucose trends and A1c value. Consider Lantus 8 units Q24 hours, Novolog 0-15 units Q4 hours if NPO.  Thanks,  Christena Deem RN, MSN, BC-ADM Inpatient Diabetes Coordinator Team Pager [phone] (8a-5p) ]

## 2019-01-09 NOTE — ED Notes (Signed)
CRITICAL VALUE ALERT  Critical Value:  Troponin 0.05  Date & Time Notied:  01/09/2019   Provider Notified: Dr. Pilar Plate   Orders Received/Actions taken: None yet

## 2019-01-09 NOTE — ED Notes (Signed)
Date and time results received: 01/09/19 12:23 PM   Test: PO2 Critical Value: less than 31  Name of Provider Notified: Dr. Pilar Plate  Orders Received? Or Actions Taken?:see orders

## 2019-01-09 NOTE — ED Notes (Signed)
CRITICAL VALUE ALERT  Critical Value:  Lactic 2.5  Date & Time Notied:  01/09/2019 1424  Provider Notified: Dr. Pilar Plate   Orders Received/Actions taken: Code Sepsis

## 2019-01-09 NOTE — H&P (Addendum)
History and Physical    Jeff Ray BJY:[id number] DOB: 09/20/1956 DOA: 01/09/2019  PCP: Jeff MorganKnowlton, [name], MD  Patient coming from: home  I have personally briefly reviewed patient's old medical records in [facility]  Chief Complaint: shortness of breath  HPI: Jeff Ray is a 63 y.o. male with medical history significant of chronic respiratory failure on 2 L of oxygen, COPD, diabetes, hypertension, BPH who is had repeated admissions for COPD exacerbation.  He was discharged from the hospital within the last month after being treated for COPD exacerbation.  His wife reports that for the past 5 days he has had progressive shortness of breath.  He has a chronic cough productive of clear sputum that is unchanged.  He has not had any fever.  He has been using his nebulizer treatment without benefit.  Denies any sick contacts.  He has had no nausea or vomiting.  She noticed this morning that he was increasingly confused and lethargic.  EMS was called and was brought to the hospital.  ED Course: Patient was noted to be lethargic and confused.  There was concern for CO2 retention.  ABG confirmed elevated PCO2.  Patient was placed on BiPAP.  Chest x-Ray indicated patchy pneumonia on the left side.  He noted to have elevated WBC count 25.9.  He was also noted to be hyperglycemic at 486.  Anion gap was 19.  Lactic acid mildly elevated at 2.5.  Patient is referred for admission.  Review of Systems: As per HPI otherwise 10 point review of systems negative.    Past Medical History:  Diagnosis Date  . Anxiety   . Asthma   . COPD (chronic obstructive pulmonary disease) (HCC)   . Diabetes mellitus   . History of nuclear stress test 02/2016   low risk study  . Hypertension   . On home O2    2L N/C     Past Surgical History:  Procedure Laterality Date  . BACK SURGERY      Social History:  reports that he quit smoking about 6 years ago. His smoking use included cigarettes. He smoked 0.00  packs per day for 10.00 years. He has never used smokeless tobacco. He reports that he does not drink alcohol or use drugs.  No Known Allergies  Family History  Problem Relation Age of Onset  . Diabetes Mother      Prior to Admission medications   Medication Sig Start Date End Date Taking? Authorizing Provider  albuterol (PROAIR HFA) 108 (90 BASE) MCG/ACT inhaler Inhale 2 puffs into the lungs every 6 (six) hours as needed for wheezing or shortness of breath. Shortness of Breath   Yes [provider]  alfuzosin (UROXATRAL) 10 MG 24 hr tablet Take 10 mg by mouth at bedtime.    Yes [provider]  ALPRAZolam Prudy Feeler(XANAX) 1 MG tablet Take 1 mg by mouth 3 (three) times daily as needed for anxiety. 09/30/17  Yes [provider]  fexofenadine-pseudoephedrine (ALLEGRA-D 24) 180-240 MG 24 hr tablet Take 1 tablet by mouth daily.   Yes [provider]  fluticasone (FLONASE) 50 MCG/ACT nasal spray Place 2 sprays into both nostrils daily. 09/30/17  Yes [provider]  glimepiride (AMARYL) 2 MG tablet Take 2 mg by mouth daily with breakfast.   Yes [provider]  Guaifenesin (MUCINEX MAXIMUM STRENGTH) 1200 MG TB12 Take 1 tablet (1,200 mg total) by mouth 2 (two) times daily. 12/11/18  Yes Shon HaleEmokpae, Courage, MD  ipratropium-albuterol (DUONEB)  0.5-2.5 (3) MG/3ML SOLN Take 3 mLs by nebulization every 4 (four) hours as needed. For shortness of breath   Yes [provider]  metFORMIN (GLUCOPHAGE-XR) 500 MG 24 hr tablet Take 500 mg by mouth 2 (two) times daily.    Yes [provider]  montelukast (SINGULAIR) 10 MG tablet Take 10 mg by mouth every morning.    Yes [provider]  Multiple Vitamins-Minerals (CENTRUM SILVER 50+MEN PO) Take 1 tablet by mouth daily.   Yes [provider]  oxybutynin (DITROPAN) 5 MG tablet Take 5 mg by mouth 3 (three) times daily.   Yes [provider]  pantoprazole (PROTONIX) 40 MG tablet  Take 40 mg by mouth every morning.    Yes [provider]  rosuvastatin (CRESTOR) 40 MG tablet Take 40 mg by mouth at bedtime.  07/24/17  Yes [provider]  TRELEGY ELLIPTA 100-62.5-25 MCG/INH AEPB Take 2 puffs by mouth daily. 11/17/18  Yes [provider]  ondansetron (ZOFRAN) 4 MG tablet Take 1 tablet (4 mg total) by mouth every 6 (six) hours as needed for nausea. Patient not taking: Reported on 01/09/2019 12/11/18   Shon Hale, MD  predniSONE (DELTASONE) 20 MG tablet Take 1 tablet (20 mg total) by mouth daily with breakfast. Patient not taking: Reported on 01/09/2019 12/11/18   Shon Hale, MD    Physical Exam: Vitals:   01/09/19 1615 01/09/19 1646 01/09/19 1651 01/09/19 1700  BP: 136/68 140/86 (!) 176/110 (!) 154/104  Pulse: (!) 105 (!) 106 (!) 110 (!) 109  Resp: (!) 34 (!) 35 (!) 23 18  Temp:      TempSrc:      SpO2: 99% 100% 100% 100%  Weight:      Height:   5\' 9"  (1.753 m)     Constitutional: NAD, calm, comfortable Eyes: PERRL, lids and conjunctivae normal ENMT: Bipap mask in place.  Neck: normal, supple, no masses, no thyromegaly Respiratory: crackles on left side, fair air movement, no wheezing Cardiovascular: Regular rate and rhythm, no murmurs / rubs / gallops. No extremity edema. 2+ pedal pulses. No carotid bruits.  Abdomen: no tenderness, no masses palpated. No hepatosplenomegaly. Bowel sounds positive.  Musculoskeletal: no clubbing / cyanosis. No joint deformity upper and lower extremities. Good ROM, no contractures. Normal muscle tone.  Skin: no rashes, lesions, ulcers. No induration Neurologic: patient does not participate with exam  Psychiatric: somnolent, does not answer questions   Labs on Admission: I have personally reviewed following labs and imaging studies  CBC: Recent Labs  Lab 01/09/19 1206  WBC 25.9*  HGB 13.5  HCT 43.3  MCV 87.7  PLT 528*   Basic Metabolic Panel: Recent Labs  Lab 01/09/19 1206  NA 132*  K  5.5*  CL 88*  CO2 25  GLUCOSE 486*  BUN 23  CREATININE 1.18  CALCIUM 10.1   GFR: Estimated Creatinine Clearance: 60.8 mL/min (by C-G formula based on SCr of 1.18 mg/dL). Liver Function Tests: No results for input(s): AST, ALT, ALKPHOS, BILITOT, PROT, ALBUMIN in the last 168 hours. No results for input(s): LIPASE, AMYLASE in the last 168 hours. No results for input(s): AMMONIA in the last 168 hours. Coagulation Profile: No results for input(s): INR, PROTIME in the last 168 hours. Cardiac Enzymes: Recent Labs  Lab 01/09/19 1206  TROPONINI 0.05*   BNP (last 3 results) No results for input(s): PROBNP in the last 8760 hours. HbA1C: No results for input(s): HGBA1C in the last 72 hours. CBG: Recent Labs  Lab 01/09/19 1156 01/09/19 1455  GLUCAP 445* 386*   Lipid Profile: No results for input(s): CHOL, HDL, LDLCALC, TRIG, CHOLHDL, LDLDIRECT in the last 72 hours. Thyroid Function Tests: No results for input(s): TSH, T4TOTAL, FREET4, T3FREE, THYROIDAB in the last 72 hours. Anemia Panel: No results for input(s): VITAMINB12, FOLATE, FERRITIN, TIBC, IRON, RETICCTPCT in the last 72 hours. Urine analysis:    Component Value Date/Time   COLORURINE YELLOW 10/27/2018 0458   APPEARANCEUR CLEAR 10/27/2018 0458   LABSPEC 1.018 10/27/2018 0458   PHURINE 5.0 10/27/2018 0458   GLUCOSEU NEGATIVE 10/27/2018 0458   HGBUR SMALL (A) 10/27/2018 0458   BILIRUBINUR NEGATIVE 10/27/2018 0458   KETONESUR NEGATIVE 10/27/2018 0458   PROTEINUR NEGATIVE 10/27/2018 0458   UROBILINOGEN 0.2 02/08/2008 1731   NITRITE NEGATIVE 10/27/2018 0458   LEUKOCYTESUR NEGATIVE 10/27/2018 0458    Radiological Exams on Admission: Dg Chest Port 1 View  Result Date: 01/09/2019 CLINICAL DATA:  Decreased level of consciousness and shortness of breath EXAM: PORTABLE CHEST 1 VIEW COMPARISON:  12/09/2018 FINDINGS: Cardiac shadows within normal limits. Lungs are hyperinflated similar to that seen on the prior exam. Patchy  infiltrate is noted in the left upper lobe and to a lesser degree in the left lower lobe consistent with multifocal pneumonia. No sizable effusion is seen. No bony abnormality is noted. IMPRESSION: Patchy multifocal pneumonia within the left lung as described. COPD. Electronically Signed   By: Alcide Clever M.D.   On: 01/09/2019 12:46    EKG: Independently reviewed. Sinus tachycardia with RBBB  Assessment/Plan Active Problems:   Acute on chronic respiratory failure with hypercapnia (HCC)   COPD exacerbation (HCC)   HLD (hyperlipidemia)   BPH (benign prostatic hyperplasia)   Uncontrolled type 2 diabetes mellitus with hyperglycemia (HCC)   Pneumonia   Acute metabolic encephalopathy     1. Acute on chronic respiratory failure with hypercapnia.  Precipitated by pneumonia and COPD exacerbation.  Patient was placed on BiPAP in the emergency room.  At the time of my evaluation, he was still somnolent, opens eyes to voice but does not answer questions. 2. Pneumonia.  Will treat as healthcare associated pneumonia and started on vancomycin and cefepime.  Check urinary antigens.  Case was reviewed with infectious disease, Dr. Daiva Eves who expressed concerns for underlying coronavirus infection.  Recommended COVID-19 testing.  Nasal swab sent to the emergency room.  Since patient is on BiPAP, he cannot be transferred via [facility] to [facility].  Case was discussed with Dr. Vassie Loll on-call for critical care.  Patient was intubated to secure his airway, so that he could be transported to [facility] for further treatments.  We will keep on airborne isolation. 3. Possible sepsis.  Elevated WBC count, lactic acid, increased respiratory rate and tachycardia.  Patient received IV fluids and started on antibiotics.  Blood cultures sent. 4. Acute metabolic encephalopathy.  Secondary to hypercapnia.  Should improve as PCO2 improves. 5. COPD exacerbation.  Continue on bronchodilators.  No wheezing heard at this time.  He  did receive a dose of steroids in the emergency room. 6. Diabetes.  Will supplement with sliding scale insulin.  He is chronically on metformin.  Follow blood sugars  DVT prophylaxis: lovenox  Code Status: full code  Family Communication: discussed with patient's wife over the phone  Disposition Plan: transfer for [facility] for further treatments  Consults called:   Admission status: inpatient, ICU   Erick Blinks MD [facility]  Critical care time spent: 45 minutes.  If 7PM-7AM, please contact night-coverage [email or web address]   01/09/2019, 5:29 PM

## 2019-01-09 NOTE — Sedation Documentation (Addendum)
Dr. Pilar Plate intubated with size 7.5ett.  Secured at 25cm at lip.  Respiratory hooked to vent.  Positive color change on co2 detector.

## 2019-01-09 NOTE — ED Notes (Signed)
Will give azithromycin and Insulin when Rocephin is done.

## 2019-01-10 DIAGNOSIS — J9622 Acute and chronic respiratory failure with hypercapnia: Secondary | ICD-10-CM

## 2019-01-10 LAB — GLUCOSE, CAPILLARY
Glucose-Capillary: 133 mg/dL — ABNORMAL HIGH (ref 70–99)
Glucose-Capillary: 135 mg/dL — ABNORMAL HIGH (ref 70–99)
Glucose-Capillary: 177 mg/dL — ABNORMAL HIGH (ref 70–99)
Glucose-Capillary: 188 mg/dL — ABNORMAL HIGH (ref 70–99)
Glucose-Capillary: 205 mg/dL — ABNORMAL HIGH (ref 70–99)
Glucose-Capillary: 224 mg/dL — ABNORMAL HIGH (ref 70–99)
Glucose-Capillary: 266 mg/dL — ABNORMAL HIGH (ref 70–99)
Glucose-Capillary: 287 mg/dL — ABNORMAL HIGH (ref 70–99)
Glucose-Capillary: 327 mg/dL — ABNORMAL HIGH (ref 70–99)
Glucose-Capillary: 330 mg/dL — ABNORMAL HIGH (ref 70–99)
Glucose-Capillary: 362 mg/dL — ABNORMAL HIGH (ref 70–99)
Glucose-Capillary: 418 mg/dL — ABNORMAL HIGH (ref 70–99)
Glucose-Capillary: 439 mg/dL — ABNORMAL HIGH (ref 70–99)
Glucose-Capillary: 458 mg/dL — ABNORMAL HIGH (ref 70–99)
Glucose-Capillary: 72 mg/dL (ref 70–99)

## 2019-01-10 LAB — POCT I-STAT 7, (LYTES, BLD GAS, ICA,H+H)
Acid-base deficit: 1 mmol/L (ref 0.0–2.0)
Bicarbonate: 27 mmol/L (ref 20.0–28.0)
Calcium, Ion: 1.35 mmol/L (ref 1.15–1.40)
HCT: 35 % — ABNORMAL LOW (ref 39.0–52.0)
Hemoglobin: 11.9 g/dL — ABNORMAL LOW (ref 13.0–17.0)
O2 Saturation: 99 %
Patient temperature: 98.6
Potassium: 6.1 mmol/L — ABNORMAL HIGH (ref 3.5–5.1)
Sodium: 130 mmol/L — ABNORMAL LOW (ref 135–145)
TCO2: 29 mmol/L (ref 22–32)
pCO2 arterial: 59.2 mmHg — ABNORMAL HIGH (ref 32.0–48.0)
pH, Arterial: 7.267 — ABNORMAL LOW (ref 7.350–7.450)
pO2, Arterial: 154 mmHg — ABNORMAL HIGH (ref 83.0–108.0)

## 2019-01-10 LAB — BASIC METABOLIC PANEL
Anion gap: 19 — ABNORMAL HIGH (ref 5–15)
Anion gap: 12 (ref 5–15)
Anion gap: 11 (ref 5–15)
BUN: 24 mg/dL — ABNORMAL HIGH (ref 8–23)
BUN: 22 mg/dL (ref 8–23)
BUN: 22 mg/dL (ref 8–23)
CO2: 24 mmol/L (ref 22–32)
CO2: 32 mmol/L (ref 22–32)
CO2: 32 mmol/L (ref 22–32)
Calcium: 9.5 mg/dL (ref 8.9–10.3)
Calcium: 10.3 mg/dL (ref 8.9–10.3)
Calcium: 10.4 mg/dL — ABNORMAL HIGH (ref 8.9–10.3)
Chloride: 91 mmol/L — ABNORMAL LOW (ref 98–111)
Chloride: 95 mmol/L — ABNORMAL LOW (ref 98–111)
Chloride: 98 mmol/L (ref 98–111)
Creatinine, Ser: 1.13 mg/dL (ref 0.61–1.24)
Creatinine, Ser: 1.02 mg/dL (ref 0.61–1.24)
Creatinine, Ser: 1.02 mg/dL (ref 0.61–1.24)
GFR calc Af Amer: >60 mL/min (ref >60)
GFR calc Af Amer: >60 mL/min (ref >60)
GFR calc Af Amer: >60 mL/min (ref >60)
GFR calc non Af Amer: >60 mL/min (ref >60)
GFR calc non Af Amer: >60 mL/min (ref >60)
GFR calc non Af Amer: >60 mL/min (ref >60)
Glucose, Bld: 401 mg/dL — ABNORMAL HIGH (ref 70–99)
Glucose, Bld: 225 mg/dL — ABNORMAL HIGH (ref 70–99)
Glucose, Bld: 143 mg/dL — ABNORMAL HIGH (ref 70–99)
Potassium: 5.4 mmol/L — ABNORMAL HIGH (ref 3.5–5.1)
Potassium: 3.6 mmol/L (ref 3.5–5.1)
Potassium: 3.5 mmol/L (ref 3.5–5.1)
Sodium: 134 mmol/L — ABNORMAL LOW (ref 135–145)
Sodium: 139 mmol/L (ref 135–145)
Sodium: 141 mmol/L (ref 135–145)

## 2019-01-10 LAB — STREP PNEUMONIAE URINARY ANTIGEN: Strep Pneumo Urinary Antigen: NEGATIVE

## 2019-01-10 LAB — CBC
HCT: 34.9 % — ABNORMAL LOW (ref 39.0–52.0)
Hemoglobin: 10.9 g/dL — ABNORMAL LOW (ref 13.0–17.0)
MCH: 26.9 pg (ref 26.0–34.0)
MCHC: 31.2 g/dL (ref 30.0–36.0)
MCV: 86.2 fL (ref 80.0–100.0)
Platelets: 398 10*3/uL (ref 150–400)
RBC: 4.05 MIL/uL — ABNORMAL LOW (ref 4.22–5.81)
RDW: 14.4 % (ref 11.5–15.5)
WBC: 21.2 10*3/uL — ABNORMAL HIGH (ref 4.0–10.5)
nRBC: 0 % (ref 0.0–0.2)

## 2019-01-10 LAB — RESPIRATORY PANEL BY PCR

## 2019-01-10 LAB — PROCALCITONIN: Procalcitonin: 1.78 ng/mL

## 2019-01-10 LAB — PHOSPHORUS: Phosphorus: 1.5 mg/dL — ABNORMAL LOW (ref 2.5–4.6)

## 2019-01-10 LAB — MRSA PCR SCREENING: MRSA by PCR: NEGATIVE

## 2019-01-10 LAB — MAGNESIUM: Magnesium: 2.6 mg/dL — ABNORMAL HIGH (ref 1.7–2.4)

## 2019-01-10 MED ORDER — BUDESONIDE 0.25 MG/2ML IN SUSP
0.2500 mg | Freq: Four times a day (QID) | RESPIRATORY_TRACT | Status: DC
  Administered 2019-01-10: 0.25 mg via RESPIRATORY_TRACT
  Filled 2019-01-10: qty 2

## 2019-01-10 MED ORDER — LACTATED RINGERS IV SOLN: INTRAVENOUS | Status: DC

## 2019-01-10 MED ORDER — INSULIN GLARGINE 100 UNIT/ML ~~LOC~~ SOLN
12.0000 [IU] | Freq: Every day | SUBCUTANEOUS | Status: DC
  Administered 2019-01-10 – 2019-01-11 (×2): 12 [IU] via SUBCUTANEOUS
  Filled 2019-01-10 (×3): qty 0.12

## 2019-01-10 MED ORDER — INSULIN ASPART 100 UNIT/ML ~~LOC~~ SOLN
0.0000 [IU] | Freq: Every day | SUBCUTANEOUS | Status: DC
  Administered 2019-01-10: 3 [IU] via SUBCUTANEOUS

## 2019-01-10 MED ORDER — INSULIN GLARGINE 100 UNIT/ML ~~LOC~~ SOLN
12.0000 [IU] | Freq: Every day | SUBCUTANEOUS | Status: DC
  Filled 2019-01-10: qty 0.12

## 2019-01-10 MED ORDER — ORAL CARE MOUTH RINSE
15.0000 mL | OROMUCOSAL | Status: DC
  Administered 2019-01-10 (×5): 15 mL via OROMUCOSAL

## 2019-01-10 MED ORDER — INSULIN ASPART 100 UNIT/ML ~~LOC~~ SOLN: 2.0000 [IU] | SUBCUTANEOUS | Status: DC | PRN

## 2019-01-10 MED ORDER — PREDNISONE 20 MG PO TABS
40.0000 mg | ORAL_TABLET | Freq: Every day | ORAL | Status: DC
  Administered 2019-01-11: 40 mg via ORAL
  Filled 2019-01-10: qty 2

## 2019-01-10 MED ORDER — SODIUM CHLORIDE 0.9 % IV SOLN
500.0000 mg | INTRAVENOUS | Status: DC
  Administered 2019-01-10: 500 mg via INTRAVENOUS
  Filled 2019-01-10 (×3): qty 500

## 2019-01-10 MED ORDER — CHLORHEXIDINE GLUCONATE 0.12% ORAL RINSE (MEDLINE KIT)
15.0000 mL | Freq: Two times a day (BID) | OROMUCOSAL | Status: DC
  Administered 2019-01-10 (×2): 15 mL via OROMUCOSAL

## 2019-01-10 MED ORDER — INSULIN ASPART 100 UNIT/ML ~~LOC~~ SOLN: 0.0000 [IU] | Freq: Three times a day (TID) | SUBCUTANEOUS | Status: DC

## 2019-01-10 MED ORDER — PREDNISONE 20 MG PO TABS
40.0000 mg | ORAL_TABLET | Freq: Every day | ORAL | Status: DC
  Administered 2019-01-10: 40 mg via ORAL
  Filled 2019-01-10: qty 2

## 2019-01-10 MED ORDER — DEXTROSE IN LACTATED RINGERS 5 % IV SOLN
INTRAVENOUS | Status: DC
  Administered 2019-01-10: 12:00:00 via INTRAVENOUS

## 2019-01-10 MED ORDER — INSULIN GLARGINE 100 UNIT/ML ~~LOC~~ SOLN
8.0000 [IU] | Freq: Every day | SUBCUTANEOUS | Status: DC
  Filled 2019-01-10 (×2): qty 0.08

## 2019-01-10 MED ORDER — SODIUM PHOSPHATES 45 MMOLE/15ML IV SOLN
20.0000 mmol | Freq: Once | INTRAVENOUS | Status: AC
  Administered 2019-01-10: 20 mmol via INTRAVENOUS
  Filled 2019-01-10: qty 6.67

## 2019-01-10 MED ORDER — SODIUM CHLORIDE 0.9 % IV SOLN
INTRAVENOUS | Status: DC | PRN
  Administered 2019-01-10: 1000 mL via INTRAVENOUS

## 2019-01-10 MED ORDER — INSULIN ASPART 100 UNIT/ML ~~LOC~~ SOLN
0.0000 [IU] | Freq: Three times a day (TID) | SUBCUTANEOUS | Status: DC
  Administered 2019-01-10: 3 [IU] via SUBCUTANEOUS
  Administered 2019-01-11: 8 [IU] via SUBCUTANEOUS
  Administered 2019-01-11 – 2019-01-12 (×3): 5 [IU] via SUBCUTANEOUS
  Administered 2019-01-12: 8 [IU] via SUBCUTANEOUS
  Administered 2019-01-12: 3 [IU] via SUBCUTANEOUS
  Administered 2019-01-13: 5 [IU] via SUBCUTANEOUS
  Administered 2019-01-13 – 2019-01-14 (×4): 2 [IU] via SUBCUTANEOUS
  Administered 2019-01-15 (×3): 3 [IU] via SUBCUTANEOUS
  Administered 2019-01-16: 8 [IU] via SUBCUTANEOUS
  Administered 2019-01-16: 5 [IU] via SUBCUTANEOUS
  Administered 2019-01-16: 8 [IU] via SUBCUTANEOUS
  Administered 2019-01-17: 3 [IU] via SUBCUTANEOUS
  Administered 2019-01-17: 8 [IU] via SUBCUTANEOUS
  Administered 2019-01-17: 3 [IU] via SUBCUTANEOUS
  Administered 2019-01-18: 8 [IU] via SUBCUTANEOUS
  Administered 2019-01-18: 3 [IU] via SUBCUTANEOUS
  Administered 2019-01-18 – 2019-01-19 (×2): 8 [IU] via SUBCUTANEOUS
  Administered 2019-01-19: 5 [IU] via SUBCUTANEOUS

## 2019-01-10 NOTE — Procedures (Signed)
Extubation Procedure Note  Patient Details:   Name: SIG CLISHAM DOB: 1956-08-30 MRN: [id number]   Airway Documentation:    Vent end date: 01/10/19 Vent end time: 1328   Evaluation  O2 sats: stable throughout Complications: No apparent complications Patient did tolerate procedure well. Bilateral Breath Sounds: Diminished   Yes  Pt extubated & placed on 3lpm  [place]   Melanee Spry 01/10/2019, 1:38 PM

## 2019-01-10 NOTE — Progress Notes (Addendum)
NAME:  Jeff Ray, MRN:  [id number], DOB:  Nov 01, 1955, LOS: 1 ADMISSION DATE:  01/09/2019, CONSULTATION DATE:  01/09/19 REFERRING MD:  [facility]  CHIEF COMPLAINT:  SOB   Brief History   Jeff Ray is a 63 y.o. male who was admitted to [facility] with acute on chronic hypoxic and hypercapnic respiratory failure, likely due to COPD exacerbation (has had mutliple admissions for this in the past).  Was deemed to be a COVID-19 rule out per ID; therefore, required intubation prior to transfer to [facility] (was on BiPAP prior).  History of present illness   Pt is encephelopathic; therefore, this HPI is obtained from chart review. Jeff Ray is a 63 y.o. male who has a PMH as outlined below including but not limited to chronic hypoxic respiratory failure on 2L O2, COPD, DM, HTN, BPH (see "past medical history").  Of note, he has had repeated admissions for COPD exacerbations.   He presented to [facility] ED 4/7 with SOB that had progressed over the prior 5 days.  He does have chronic cough productive of clear sputum and this had not changed.  Denied any exposures to known sick contacts.  In ED, he was noted to be hypercapnic and was placed on BiPAP.  CXR was concerning for PNA.  Case discussed with ID who recommended COVID-19 testing.  Due to him being on BiPAP and not able to transfer to [facility] (due to COVID / aerosolization), he was ultimately intubated.  After arrival to [facility], he was found to be hyperglycemic with ketones noted in urine.  He was subsequently started on DKA protocol.  Past Medical History  COPD on chronic 2L O2, HTN, DM, anxiety.  Significant Hospital Events   4/7 > admit.  Consults:  None.  Procedures:  ETT 4/7 >   Significant Diagnostic Tests:  CXR 4/7 > multifocal PNA   Micro Data:  Blood 4/7 > no growth 24 hrs  Sputum 4/7 >  RVP 4/7 >  COVID-19 4/7 >  Urine strep 4/7 >  Urine legionella 4/7 >  MRSA 4/7> neg   Antimicrobials:  Vanc 4/7 > 4/8  Cefepime 4/7 >   Azithromycin 4/8>   Interim history/subjective:  Intubated, sedated, on insulin gtt. No pressors Vent weaned this morning PRVC FiO2 40%, PEEP 5, Vt 560  MRSA resulted negative, dc vanc this morning. Adding Azithromycin this morning. Transitioning off of insulin gtt  Objective:  Blood pressure 103/64, pulse 85, temperature 97.7 F (36.5 C), temperature source Axillary, resp. rate (!) 22, height 5\' 9"  (1.753 m), weight 77.4 kg, SpO2 100 %.    Vent Mode: PRVC FiO2 (%):  [40 %-100 %] 40 % Set Rate:  [18 bmp-22 bmp] 22 bmp Vt Set:  [450 mL-560 mL] 560 mL PEEP:  [5 cmH20] 5 cmH20 Plateau Pressure:  [10 cmH20-15 cmH20] 15 cmH20   Intake/Output Summary (Last 24 hours) at 01/10/2019 0819 Last data filed at 01/10/2019 0800 Gross per 24 hour  Intake 2148.69 ml  Output 1125 ml  Net 1023.69 ml   Filed Weights   01/09/19 1153 01/09/19 1954 01/10/19 0500  Weight: 68 kg 77.5 kg 77.4 kg    Examination:  General: chronically ill appearing older adult male, intubated, lightly sedated, NAD  Neuro: Awakens to voice, follows basic commands. PERRL. HEENT: NCAT, ETT OGT secure, trachea midline  Cardiovascular:RRR s1s2 no r/g/m  Lungs: Bilateral wheeze. Respirations even and unlabored during SBT. No accessory muscle recruitment.  Abdomen: soft, round, ndnt, normoactive x4  Musculoskeletal: Symmetrical bulk and tone, no edema, no obvious deformity  Skin: Clean, dry, warm, intact no rash or abrasion   Assessment & Plan:   Acute Respiratory Failure with hypercarbia  - initially on BiPAP for hypercapnia, but required intubation prior to transfer from [facility] (unable to transport on BiPAP due to aerosolization of possible COVID). -Possible COPD exacerbation on home 2LNC -Possible HCAP -COVID-19 r/o -CXR 4/7> LUL LLL consolidations, RLL suggestive of atelectasis.  P - Continue mechanical ventilation -WUA/SBT qAM -Wean vent as able, FiO2 weaned to 40% this morning and doing well  -Weaning sedation actively, would like to  eval readiness for extubation later today.  -AM CXR - pulm hygiene  -f/u COVID-19, RVP  -Send PCT  -Send tracheal aspirate  -Empiric abx: cefepime, azithromycin  -Ordering prednisone, clinically this seems like COPD exacerbation and feel that the benefit of treating COPD exacerbation outweighs the risk of steroid in (low likelihood) COVID-19 r/o  -Duonebs, budesimide, albuterol  -Extubate to [place]. No BiPAP given COVID r/o.   DKA -DM II on home glimepiride, metformin  P - Transitioning from insulin gtt to SSI and Lantus  - Hold preadmission glimepiride, metformin. - DM educator consult   Electrolyte abnormalities Hyperkalemia Hypophosphatemia  P ECG PRN S/p 1x kayexalate  Giving NaPhos now  Follow BMP, address as needed Send mag/phos   Best Practice:  Diet: NPO Pain/Anxiety/Delirium protocol (if indicated): fentanyl gtt PRN prop  VAP protocol (if indicated): In place. DVT prophylaxis: SCD's / Lovenox. GI prophylaxis: PPI.  Glucose control: Transitioning insulin gtt to SSI + Lantus  Mobility: Bedrest. Code Status: Full. Family Communication: pending  Disposition: ICU.  Labs   CBC: Recent Labs  Lab 01/09/19 1206 01/09/19 2059 01/10/19 0617  WBC 25.9* 24.4* 21.2*  HGB 13.5 11.7* 10.9*  HCT 43.3 38.7* 34.9*  MCV 87.7 87.2 86.2  PLT 528* 426* 398   Basic Metabolic Panel: Recent Labs  Lab 01/09/19 1206 01/09/19 2059 01/10/19 0038 01/10/19 0617  NA 132* 129* 134* 139  K 5.5* 6.5* 5.4* 3.6  CL 88* 89* 91* 95*  CO2 32  GLUCOSE 486* 479* 401* 225*  BUN 23 22 24* 22  CREATININE 1.18 1.27* 1.13 1.02  CALCIUM 10.1 9.7 9.5 10.3  MG  --   --   --  2.6*  PHOS  --   --   --  1.5*   GFR: Estimated Creatinine Clearance: 73.2 mL/min (by C-G formula based on SCr of 1.02 mg/dL). Recent Labs  Lab 01/09/19 1206 01/09/19 1300 01/09/19 1720 01/09/19 2059 01/10/19 0617  WBC 25.9*  --   --  24.4* 21.2*  LATICACIDVEN  --  2.5* 1.7  --   --    Liver Function  Tests: No results for input(s): AST, ALT, ALKPHOS, BILITOT, PROT, ALBUMIN in the last 168 hours. No results for input(s): LIPASE, AMYLASE in the last 168 hours. No results for input(s): AMMONIA in the last 168 hours. ABG    Component Value Date/Time   PHART 7.321 (L) 01/09/2019 1415   PCO2ART 50.5 (H) 01/09/2019 1415   PO2ART 91.2 01/09/2019 1415   HCO3 23.7 01/09/2019 1415   TCO2 15.4 08/03/2015 1618   ACIDBASEDEF 0.0 01/09/2019 1415   O2SAT 95.1 01/09/2019 1415    Coagulation Profile: No results for input(s): INR, PROTIME in the last 168 hours. Cardiac Enzymes: Recent Labs  Lab 01/09/19 1206  TROPONINI 0.05*   HbA1C: Hgb A1c MFr Bld  Date/Time Value Ref Range Status  12/09/2018 07:16 PM 10.0 (H) 4.8 - 5.6 % Final    Comment:    (NOTE) Pre diabetes:          5.7%-6.4% Diabetes:              >6.4% Glycemic control for   <7.0% adults with diabetes   10/27/2018 05:29 AM 9.2 (H) 4.8 - 5.6 % Final    Comment:    (NOTE) Pre diabetes:          5.7%-6.4% Diabetes:              >6.4% Glycemic control for   <7.0% adults with diabetes    CBG: Recent Labs  Lab 01/09/19 2228 01/09/19 2330 01/10/19 0041 01/10/19 0147 01/10/19 0253  GLUCAP 458* 439* 418* 362* 330*      Critical care time: 40 min    Tessie FassGrace Esaw Knippel MSN, AGACNP-BC [place] [facility] [phone] If no answer, [phone] 01/10/2019, 8:19 AM

## 2019-01-10 NOTE — Progress Notes (Signed)
Pt has extubation orders but is still difficult to arouse. Pt will open his eyes to his name. Will consult with MD prior to extubation.

## 2019-01-10 NOTE — Progress Notes (Signed)
Inpatient Diabetes Program Recommendations  AACE/ADA: New Consensus Statement on Inpatient Glycemic Control (2015)  Target Ranges:  Prepandial:   less than 140 mg/dL      Peak postprandial:   less than 180 mg/dL (1-2 hours)      Critically ill patients:  140 - 180 mg/dL   Lab Results  Component Value Date   GLUCAP 135 (H) 01/10/2019   HGBA1C 10.0 (H) 12/09/2018    Review of Glycemic Control Results for Jeff Ray, Jeff Ray (MRN [id number]) as of 01/10/2019 10:53  Ref. Range 01/10/2019 02:53 01/10/2019 09:12 01/10/2019 10:36  Glucose-Capillary Latest Ref Range: 70 - 99 mg/dL 527 (H) 782 (H) 423 (H)   Diabetes history: DM 2 Outpatient Diabetes medications:  Amaryl 2 mg daily, Metformin 500 mg bid Current orders for Inpatient glycemic control:  IV insulin to Novolog moderate tid with meals and Lantus 8 units q HS Inpatient Diabetes Program Recommendations:    Note insulin drip rates high. Please consider changing transition orders to Levemir 12 units bid and Novolog resistant q 4 hours.  Thanks,  Beryl Meager, RN, BC-ADM Inpatient Diabetes Coordinator Pager [phone] (8a-5p)

## 2019-01-10 NOTE — Progress Notes (Signed)
62ml of Ketamine wasted in med room's steri cuycle with Salvatore Decent as witness

## 2019-01-11 LAB — BASIC METABOLIC PANEL
Anion gap: 10 (ref 5–15)
BUN: 22 mg/dL (ref 8–23)
CO2: 31 mmol/L (ref 22–32)
Calcium: 9.7 mg/dL (ref 8.9–10.3)
Chloride: 102 mmol/L (ref 98–111)
Creatinine, Ser: 1.02 mg/dL (ref 0.61–1.24)
GFR calc Af Amer: >60 mL/min (ref >60)
GFR calc non Af Amer: >60 mL/min (ref >60)
Glucose, Bld: 226 mg/dL — ABNORMAL HIGH (ref 70–99)
Potassium: 3.7 mmol/L (ref 3.5–5.1)
Sodium: 143 mmol/L (ref 135–145)

## 2019-01-11 LAB — GLUCOSE, CAPILLARY
Glucose-Capillary: 259 mg/dL — ABNORMAL HIGH (ref 70–99)
Glucose-Capillary: 215 mg/dL — ABNORMAL HIGH (ref 70–99)
Glucose-Capillary: 247 mg/dL — ABNORMAL HIGH (ref 70–99)
Glucose-Capillary: 162 mg/dL — ABNORMAL HIGH (ref 70–99)

## 2019-01-11 LAB — LEGIONELLA PNEUMOPHILA SEROGP 1 UR AG: L. pneumophila Serogp 1 Ur Ag: NEGATIVE

## 2019-01-11 LAB — PROCALCITONIN: Procalcitonin: 1.16 ng/mL

## 2019-01-11 LAB — MAGNESIUM: Magnesium: 2.1 mg/dL (ref 1.7–2.4)

## 2019-01-11 LAB — PHOSPHORUS: Phosphorus: 2.7 mg/dL (ref 2.5–4.6)

## 2019-01-11 MED ORDER — AZITHROMYCIN 250 MG PO TABS
500.0000 mg | ORAL_TABLET | Freq: Every day | ORAL | Status: AC
  Administered 2019-01-11 – 2019-01-13 (×3): 500 mg via ORAL
  Filled 2019-01-11: qty 2
  Filled 2019-01-11: qty 1
  Filled 2019-01-11: qty 2

## 2019-01-11 MED ORDER — PREDNISONE 20 MG PO TABS
20.0000 mg | ORAL_TABLET | Freq: Every day | ORAL | Status: AC
  Administered 2019-01-12 – 2019-01-13 (×2): 20 mg via ORAL
  Filled 2019-01-11 (×3): qty 1

## 2019-01-11 MED ORDER — INSULIN GLARGINE 100 UNIT/ML ~~LOC~~ SOLN
15.0000 [IU] | Freq: Every day | SUBCUTANEOUS | Status: DC
  Administered 2019-01-12 – 2019-01-16 (×5): 15 [IU] via SUBCUTANEOUS
  Filled 2019-01-11 (×7): qty 0.15

## 2019-01-11 MED ORDER — CHLORHEXIDINE GLUCONATE 0.12 % MT SOLN
15.0000 mL | Freq: Two times a day (BID) | OROMUCOSAL | Status: DC
  Administered 2019-01-11: 15 mL via OROMUCOSAL

## 2019-01-11 MED ORDER — ORAL CARE MOUTH RINSE
15.0000 mL | Freq: Two times a day (BID) | OROMUCOSAL | Status: DC
  Administered 2019-01-11 – 2019-01-16 (×7): 15 mL via OROMUCOSAL

## 2019-01-11 MED ORDER — FLUTICASONE FUROATE-VILANTEROL 100-25 MCG/INH IN AEPB
1.0000 | INHALATION_SPRAY | Freq: Every day | RESPIRATORY_TRACT | Status: DC
  Administered 2019-01-11 – 2019-01-19 (×8): 1 via RESPIRATORY_TRACT
  Filled 2019-01-11 (×2): qty 28

## 2019-01-11 MED ORDER — PANTOPRAZOLE SODIUM 40 MG PO TBEC
40.0000 mg | DELAYED_RELEASE_TABLET | Freq: Every day | ORAL | Status: DC
  Administered 2019-01-11 – 2019-01-18 (×8): 40 mg via ORAL
  Filled 2019-01-11 (×8): qty 1

## 2019-01-11 MED ORDER — PREDNISONE 20 MG PO TABS: 20.0000 mg | ORAL_TABLET | Freq: Every day | ORAL | Status: DC

## 2019-01-11 MED ORDER — UMECLIDINIUM BROMIDE 62.5 MCG/INH IN AEPB
1.0000 | INHALATION_SPRAY | Freq: Every day | RESPIRATORY_TRACT | Status: DC
  Administered 2019-01-11 – 2019-01-19 (×8): 1 via RESPIRATORY_TRACT
  Filled 2019-01-11 (×2): qty 7

## 2019-01-11 NOTE — Progress Notes (Signed)
NAME:  Jeff Ray, MRN:  [id number], DOB:  [birth year], LOS: 2 ADMISSION DATE:  01/09/2019, CONSULTATION DATE:  01/09/19 REFERRING MD:  [facility]  CHIEF COMPLAINT:  SOB   Brief History   Jeff Ray is a 63 y.o. male who was admitted to [facility] with acute on chronic hypoxic and hypercapnic respiratory failure, likely due to COPD exacerbation (has had mutliple admissions for this in the past).  Was deemed to be a COVID-19 rule out per ID; therefore, required intubation prior to transfer to [facility] (was on BiPAP prior).  History of present illness   Pt is encephelopathic; therefore, this HPI is obtained from chart review. Jeff Ray is a 63 y.o. male who has a PMH as outlined below including but not limited to chronic hypoxic respiratory failure on 2L O2, COPD, DM, HTN, BPH (see "past medical history").  Of note, he has had repeated admissions for COPD exacerbations.   He presented to [facility] 4/7 with SOB that had progressed over the prior 5 days.  He does have chronic cough productive of clear sputum and this had not changed.  Denied any exposures to known sick contacts.  In ED, he was noted to be hypercapnic and was placed on BiPAP.  CXR was concerning for PNA.  Case discussed with ID who recommended COVID-19 testing.  Due to him being on BiPAP and not able to transfer to [facility] (due to COVID / aerosolization), he was ultimately intubated.  After arrival to [facility], he was found to be hyperglycemic with ketones noted in urine.  He was subsequently started on DKA protocol.  Past Medical History  COPD on chronic 2L O2, HTN, DM, anxiety.  Significant Hospital Events   4/7 > admit.  Consults:  None.  Procedures:  ETT 4/7 > 4/8  Significant Diagnostic Tests:  CXR 4/7 > multifocal PNA   Micro Data:  Blood 4/7 > no growth 24 hrs  Sputum 4/7 >  RVP 4/7 > positive rhinovirus COVID-19 4/7 >  Urine strep 4/7 >  Urine legionella 4/7 >  MRSA 4/7> neg   Antimicrobials:  Vanc 4/7 > 4/8  Cefepime 4/7 >    Azithromycin 4/8>  Interim history/subjective:  Currently off insulin drip.      Objective:  Blood pressure 119/69, pulse 84, temperature 98.4 F (36.9 C), temperature source Axillary, resp. rate (!) 30, height  (1.753 m), weight 76.4 kg, SpO2 99 %.    FiO2 (%):  [40 %] 40 %   Intake/Output Summary (Last 24 hours) at 01/11/2019 1059 Last data filed at 01/11/2019 1000 Gross per 24 hour  Intake 2076.32 ml  Output 1240 ml  Net 836.32 ml   Filed Weights   01/09/19 1954 01/10/19 0500 01/11/19 0500  Weight: 77.5 kg 77.4 kg 76.4 kg    Examination:   General: Awake extubated asking for food HEENT no JVD no lymphadenopathy is improved Neuro: Awake follows commands, requesting pancakes CV: s1s2 rrr, no m/r/g PULM: even/non-labored, lungs bilaterally diminished in bases bilaterally WU:JWJX, non-tender, bsx4 active  Extremities: warm/dry, negative edema  Skin: Warm and dry   Assessment & Plan:   Acute Respiratory Failure with hypercarbia  - initially on BiPAP for hypercapnia, but required intubation prior to transfer from [facility] (unable to transport on BiPAP due to aerosolization of possible COVID).  Positive rhinovirus -Possible COPD exacerbation on home 2LNC -Possible HCAP -COVID-19 r/o -CXR 4/7> LUL LLL consolidations, RLL suggestive of atelectasis.  P Extubated 01/10/2019 Wean O2  as needed Dilators as needed Chest x-ray [place] toilet Rule out COVID-19   -Send PCT   monitor cultures Continue empiric antimicrobial therapy with cefepime and Zithromax Placed on prednisone for 06/2019 2020 with suspected COPD exacerbation.  Note glucose elevated therefore will decrease dose of prednisone 01/11/2019 20 mg daily given low threshold to discontinue altogether Bronchodilators  transition to progressive care unit with negative pressure room until COVID-19 is ruled out Will transition to [facility] for 14 2020  DKA CBG (last 3)  Recent Labs    01/10/19 1520 01/10/19 2151  01/11/19 0813  GLUCAP 188* 287* 259*    -DM II on home glimepiride, metformin Note he is on prednisone P Currently on sliding scale insulin along with Lantus Continue to hold glimepiride and metformin Diabetes mellitus educators been consulted  Electrolyte abnormalities Hyperkalemia Hypophosphatemia  Recent Labs  Lab 01/10/19 0617 01/10/19 0943 01/11/19 0319  K 3.6 3.5 3.7    P Potassium phosphorus are within normal limits on 01/11/2019  Continue to monitor  Best Practice:  Diet: Advance Pain/Anxiety/Delirium protocol (if indicated): Off sedation VAP protocol (if indicated): I none indicated DVT prophylaxis: SCD's / Lovenox. GI prophylaxis: PPI.  Glucose control: Transitioning insulin gtt to SSI + Lantus  Mobility: Bedrest. Code Status: Full. Family Communication: He is currently extubated on 01/11/2019 and he can communicate with his family as needed. Disposition: 01/11/2019 Transition to progressive care, neurology is in a negative pressure room, tried hospital service condition Ms. Carol 01/12/2019  Labs   CBC: Recent Labs  Lab 01/09/19 1206 01/09/19 2059 01/09/19 2113 01/10/19 0617  WBC 25.9* 24.4*  --  21.2*  HGB 13.5 11.7* 11.9* 10.9*  HCT 43.3 38.7* 35.0* 34.9*  MCV 87.7 87.2  --  86.2  PLT 528* 426*  --  398   Basic Metabolic Panel: Recent Labs  Lab 01/09/19 2059 01/09/19 2113 01/10/19 0038 01/10/19 0617 01/10/19 0943 01/11/19 0319  NA 129* 130* 134* 139 141 143  K 6.5* 6.1* 5.4* 3.6 3.5 3.7  CL 89*  --  91* 95* 98 102  CO2 23  --  24 32 32 31  GLUCOSE 479*  --  401* 225* 143* 226*  BUN 22  --  24* 22 22 22   CREATININE 1.27*  --  1.13 1.02 1.02 1.02  CALCIUM 9.7  --  9.5 10.3 10.4* 9.7  MG  --   --   --  2.6*  --  2.1  PHOS  --   --   --  1.5*  --  2.7   GFR: Estimated Creatinine Clearance: 73.2 mL/min (by C-G formula based on SCr of 1.02 mg/dL). Recent Labs  Lab 01/09/19 1206 01/09/19 1300 01/09/19 1720 01/09/19 2059 01/10/19 0617  01/10/19 0943 01/11/19 0319  PROCALCITON  --   --   --   --   --  1.78 1.16  WBC 25.9*  --   --  24.4* 21.2*  --   --   LATICACIDVEN  --  2.5* 1.7  --   --   --   --    Liver Function Tests: No results for input(s): AST, ALT, ALKPHOS, BILITOT, PROT, ALBUMIN in the last 168 hours. No results for input(s): LIPASE, AMYLASE in the last 168 hours. No results for input(s): AMMONIA in the last 168 hours. ABG    Component Value Date/Time   PHART 7.267 (L) 01/09/2019 2113   PCO2ART 59.2 (H) 01/09/2019 2113   PO2ART 154.0 (H) 01/09/2019 2113  HCO3 27.0 01/09/2019 2113   TCO2 29 01/09/2019 2113   ACIDBASEDEF 1.0 01/09/2019 2113   O2SAT 99.0 01/09/2019 2113    Coagulation Profile: No results for input(s): INR, PROTIME in the last 168 hours. Cardiac Enzymes: Recent Labs  Lab 01/09/19 1206  TROPONINI 0.05*   HbA1C: Hgb A1c MFr Bld  Date/Time Value Ref Range Status  12/09/2018 07:16 PM 10.0 (H) 4.8 - 5.6 % Final    Comment:    (NOTE) Pre diabetes:          5.7%-6.4% Diabetes:              >6.4% Glycemic control for   <7.0% adults with diabetes   10/27/2018 05:29 AM 9.2 (H) 4.8 - 5.6 % Final    Comment:    (NOTE) Pre diabetes:          5.7%-6.4% Diabetes:              >6.4% Glycemic control for   <7.0% adults with diabetes    CBG: Recent Labs  Lab 01/10/19 1238 01/10/19 1314 01/10/19 1520 01/10/19 2151 01/11/19 0813  GLUCAP 72 133* 188* 287* 259*      Critical care time: 30 min    Brett CanalesSteve  ACNP [facility] Pager [phone] till 1 pm If no answer page [phone] 01/11/2019, 10:59 AM

## 2019-01-11 NOTE — Progress Notes (Signed)
49/20/2020  Patient transfer from @m  to 2W he is alert to time and place, but some confusion noted. Patient skin is intact, he receive a CHG bath and place on stepdown monitor. [facility] RN.

## 2019-01-11 NOTE — Progress Notes (Addendum)
Inpatient Diabetes Program Recommendations  AACE/ADA: New Consensus Statement on Inpatient Glycemic Control (2015)  Target Ranges:  Prepandial:   less than 140 mg/dL      Peak postprandial:   less than 180 mg/dL (1-2 hours)      Critically ill patients:  140 - 180 mg/dL   Lab Results  Component Value Date   GLUCAP 215 (H) 01/11/2019   HGBA1C 10.0 (H) 12/09/2018    Review of Glycemic Control  Results for ZAINE, ZUHLKE (MRN [id number]) as of 01/11/2019 14:31  Ref. Range 01/10/2019 21:51 01/11/2019 08:13 01/11/2019 12:25  Glucose-Capillary Latest Ref Range: 70 - 99 mg/dL 287 (H) 681 (H) 157 (H)   Diabetes history: DM 2 Outpatient Diabetes medications:  Amaryl 2 mg daily, Metformin 500 mg bid Current orders for Inpatient glycemic control:  Lantus 15 units QD, Novolog 0-15 units TID, Novolog 0-5 units QHS Inpatient Diabetes Program Recommendations:     If post prandials continue to exceed 180 mg/dL, consider Novolog 3 units TID (asusming patient is consuming >50% of meal).   Thanks, Lujean Rave, MSN, RNC-OB Diabetes Coordinator [phone] (8a-5p)

## 2019-01-12 ENCOUNTER — Inpatient Hospital Stay (HOSPITAL_COMMUNITY): Payer: Medicare HMO

## 2019-01-12 DIAGNOSIS — J189 Pneumonia, unspecified organism: Secondary | ICD-10-CM

## 2019-01-12 LAB — BASIC METABOLIC PANEL
Anion gap: 10 (ref 5–15)
BUN: 16 mg/dL (ref 8–23)
CO2: 32 mmol/L (ref 22–32)
Calcium: 9.4 mg/dL (ref 8.9–10.3)
Chloride: 99 mmol/L (ref 98–111)
Creatinine, Ser: 0.85 mg/dL (ref 0.61–1.24)
GFR calc Af Amer: >60 mL/min (ref >60)
GFR calc non Af Amer: >60 mL/min (ref >60)
Glucose, Bld: 174 mg/dL — ABNORMAL HIGH (ref 70–99)
Potassium: 2.9 mmol/L — ABNORMAL LOW (ref 3.5–5.1)
Sodium: 141 mmol/L (ref 135–145)

## 2019-01-12 LAB — CBC WITH DIFFERENTIAL/PLATELET
Abs Immature Granulocytes: 0.46 10*3/uL — ABNORMAL HIGH (ref 0.00–0.07)
Basophils Absolute: 0.1 10*3/uL (ref 0.0–0.1)
Basophils Relative: 0 %
Eosinophils Absolute: 0.4 10*3/uL (ref 0.0–0.5)
Eosinophils Relative: 2 %
HCT: 32.9 % — ABNORMAL LOW (ref 39.0–52.0)
Hemoglobin: 10.6 g/dL — ABNORMAL LOW (ref 13.0–17.0)
Immature Granulocytes: 2 %
Lymphocytes Relative: 6 %
Lymphs Abs: 1.3 10*3/uL (ref 0.7–4.0)
MCH: 27.5 pg (ref 26.0–34.0)
MCHC: 32.2 g/dL (ref 30.0–36.0)
MCV: 85.5 fL (ref 80.0–100.0)
Monocytes Absolute: 1.6 10*3/uL — ABNORMAL HIGH (ref 0.1–1.0)
Monocytes Relative: 7 %
Neutro Abs: 18.2 10*3/uL — ABNORMAL HIGH (ref 1.7–7.7)
Neutrophils Relative %: 83 %
Platelets: 386 10*3/uL (ref 150–400)
RBC: 3.85 MIL/uL — ABNORMAL LOW (ref 4.22–5.81)
RDW: 14.2 % (ref 11.5–15.5)
WBC: 21.9 10*3/uL — ABNORMAL HIGH (ref 4.0–10.5)
nRBC: 0 % (ref 0.0–0.2)

## 2019-01-12 LAB — PROCALCITONIN: Procalcitonin: 0.69 ng/mL

## 2019-01-12 LAB — CULTURE, RESPIRATORY W GRAM STAIN: Culture: NORMAL

## 2019-01-12 LAB — GLUCOSE, CAPILLARY
Glucose-Capillary: 145 mg/dL — ABNORMAL HIGH (ref 70–99)
Glucose-Capillary: 213 mg/dL — ABNORMAL HIGH (ref 70–99)
Glucose-Capillary: 289 mg/dL — ABNORMAL HIGH (ref 70–99)
Glucose-Capillary: 138 mg/dL — ABNORMAL HIGH (ref 70–99)

## 2019-01-12 LAB — PHOSPHORUS: Phosphorus: 2.7 mg/dL (ref 2.5–4.6)

## 2019-01-12 LAB — CULTURE, RESPIRATORY

## 2019-01-12 LAB — MAGNESIUM: Magnesium: 2 mg/dL (ref 1.7–2.4)

## 2019-01-12 MED ORDER — OXYBUTYNIN CHLORIDE 5 MG PO TABS
5.0000 mg | ORAL_TABLET | Freq: Three times a day (TID) | ORAL | Status: DC
  Administered 2019-01-12 – 2019-01-18 (×19): 5 mg via ORAL
  Filled 2019-01-12 (×19): qty 1

## 2019-01-12 MED ORDER — LORATADINE 10 MG PO TABS
10.0000 mg | ORAL_TABLET | Freq: Every day | ORAL | Status: DC
  Administered 2019-01-12 – 2019-01-19 (×8): 10 mg via ORAL
  Filled 2019-01-12 (×8): qty 1

## 2019-01-12 MED ORDER — MONTELUKAST SODIUM 10 MG PO TABS
10.0000 mg | ORAL_TABLET | Freq: Every day | ORAL | Status: DC
  Administered 2019-01-12 – 2019-01-18 (×7): 10 mg via ORAL
  Filled 2019-01-12 (×7): qty 1

## 2019-01-12 MED ORDER — FLUTICASONE PROPIONATE 50 MCG/ACT NA SUSP
2.0000 | Freq: Every day | NASAL | Status: DC
  Administered 2019-01-12 – 2019-01-19 (×8): 2 via NASAL
  Filled 2019-01-12: qty 16

## 2019-01-12 MED ORDER — PANTOPRAZOLE SODIUM 40 MG PO TBEC: 40.0000 mg | DELAYED_RELEASE_TABLET | Freq: Every morning | ORAL | Status: DC

## 2019-01-12 MED ORDER — POTASSIUM CHLORIDE CRYS ER 20 MEQ PO TBCR
40.0000 meq | EXTENDED_RELEASE_TABLET | ORAL | Status: AC
  Administered 2019-01-12 (×2): 40 meq via ORAL
  Filled 2019-01-12 (×2): qty 2

## 2019-01-12 MED ORDER — POTASSIUM CHLORIDE 10 MEQ/100ML IV SOLN
10.0000 meq | INTRAVENOUS | Status: AC
  Administered 2019-01-12 (×2): 10 meq via INTRAVENOUS
  Filled 2019-01-12 (×2): qty 100

## 2019-01-12 MED ORDER — ROSUVASTATIN CALCIUM 20 MG PO TABS
40.0000 mg | ORAL_TABLET | Freq: Every day | ORAL | Status: DC
  Administered 2019-01-12 – 2019-01-18 (×7): 40 mg via ORAL
  Filled 2019-01-12 (×7): qty 2

## 2019-01-12 MED ORDER — INSULIN ASPART 100 UNIT/ML ~~LOC~~ SOLN
3.0000 [IU] | Freq: Three times a day (TID) | SUBCUTANEOUS | Status: DC
  Administered 2019-01-12 – 2019-01-18 (×15): 3 [IU] via SUBCUTANEOUS

## 2019-01-12 NOTE — Progress Notes (Signed)
Inpatient Diabetes Program Recommendations  AACE/ADA: New Consensus Statement on Inpatient Glycemic Control (2015)  Target Ranges:  Prepandial:   less than 140 mg/dL      Peak postprandial:   less than 180 mg/dL (1-2 hours)      Critically ill patients:  140 - 180 mg/dL   Review of Glycemic Control  Diabetes history: DM 2 Outpatient Diabetes medications: Glimepiride 2 mg Daily, Metformin 500 mg BID Current orders for Inpatient glycemic control: Lantus 15 units, Novolog 0-15 units tid, Novolog 0-5 units qhs  Spoke with patient over the phone in relation to his glucose control at home. Discussed his glucose on admission, him being on steroids multiple times over the past few months. Discussed glucose control by possibly placing him on insulin at time of d/c. Patient has been on insulin in the past with vial and syringe and insulin pen.  Recommend patient being on Lantus 15 units Daily at home (covered by insurance) in addition to the orals he is already on at home. Patient to continue to check glucose bid at home and call PCP for titration if needed if glucose consistently below 100 mg/dl.  Thanks,  Christena Deem RN, MSN, BC-ADM Inpatient Diabetes Coordinator Team Pager [phone] (8a-5p)

## 2019-01-12 NOTE — Progress Notes (Addendum)
[facility]                                                                              Patient Demographics  Jeff LazierKarl Ray, is a 63 y.o. male, DOB - 08/24/1956, RUE:[id number]  Admit date - 01/09/2019   Admitting Physician Erick BlinksJehanzeb Memon, MD  Outpatient Primary MD for the patient is Jeff Ray, [name], MD  Outpatient specialists:   LOS - 3  days   Medical records reviewed and are as summarized below:    Chief Complaint  Patient presents with   Respiratory Distress       Brief summary   Jeff ImusKarl E Ray is a 63 y.o. male who [facility] with acute on chronic hypoxic and hypercapnic respiratory failure, likely due to COPD exacerbation (has had mutliple admissions for this in the past).  Was deemed to be a COVID-19 rule out per ID; therefore, required intubation [facility] (was on BiPAP prior). Patient was extubated on 01/10/2019, [facility] resumed care on 01/12/2019  COVID-19 results pending  Assessment & Plan    Principal Problem:   Acute on chronic respiratory failure with hypercapnia and hypoxia (HCC), suspected COVID, possible COPD exacerbation, HCAP -Patient had presented to [facility] ED with shortness of breath, that had progressed over the prior 5 days, chronic cough productive of clear sputum, no known sick contacts.   -In ED, noted to be hypercapnic and was placed on BiPAP, chest x-ray showed multifocal pneumonia.  Due to him being on BiPAP, aerosolization, he was ultimately intubated. -Influenza panel negative, RVP negative, strep antigen negative, urine Legionella antigen negative.  Blood cultures negative till date.  COVID-19 results pending -Patient was extubated on 01/10/2019, O2 sats 90% on 5 L -Wean O2 as tolerated, continue Breo, Incruse, wean prednisone -Continue Zithromax, cefepime IV.  If covid positive, will place on Plaquenil   Active Problems:   COPD exacerbation (HCC) -Diminished breath sounds, hypoxia O2 sats 90% on 5 L,  continue antibiotics, O2 as tolerated -Wean prednisone, continue Incruse, Breo -Added Claritin, Singulair, Flonase spray daily (patient is on Allegra, Singulair at home.)    Diabetic ketoacidosis with type 2 diabetes mellitus, uncontrolled (HCC) -Presented with DKA, was placed on insulin drip DKA protocol Hemoglobin A1c 10.0 on 12/09/2018 -Continue Lantus 15 units daily, NovoLog meal coverage 3 units 3 times daily AC, sliding scale insulin -Expect CBGs to improve after prednisone is weaned off  Hypokalemia Potassium 2.9, Mag 2.0  replaced K IV and oral.    HLD (hyperlipidemia) -Placed back on Crestor   Acute metabolic encephalopathy Now resolved, patient is alert and oriented, likely due to #1  GERD Add PPI  Generalized deconditioning PT OT evaluation  Patient Isolation: Contact + airborne HCP PPE: Wearing all recommended PPE CAPR + Gown + Gloves + Surgical Cap+ shoe care  Code Status: Full CODE STATUS DVT Prophylaxis:  Lovenox Family Communication: Discussed in detail with the patient, all imaging results, lab results explained to the patient's wife    Disposition Plan: likely in 24-48hrs   Time Spent in minutes in its  Procedures:  Intubation on 01/09/2019, extubated on 4/8  Consultants:   PCCM  Antimicrobials:   Anti-infectives (From admission, onward)   Start     Dose/Rate Route Frequency Ordered Stop   01/11/19 0821  azithromycin (ZITHROMAX) tablet 500 mg     500 mg Oral Daily 01/11/19 0822 01/14/19 0959   01/10/19 1000  azithromycin (ZITHROMAX) 500 mg in sodium chloride 0.9 % 250 mL IVPB  Status:  Discontinued     500 mg 250 mL/hr over 60 Minutes Intravenous Every 24 hours 01/10/19 0842 01/11/19 0822   01/10/19 0500  vancomycin (VANCOCIN) IVPB 750 mg/150 ml premix  Status:  Discontinued     750 mg 150 mL/hr over 60 Minutes Intravenous Every 12 hours 01/09/19 1655 01/10/19 0842   01/09/19 1700  vancomycin (VANCOCIN) IVPB 1000 mg/200 mL premix     1,000  mg 200 mL/hr over 60 Minutes Intravenous  Once 01/09/19 1655 01/09/19 1915   01/09/19 1700  ceFEPIme (MAXIPIME) 2 g in sodium chloride 0.9 % 100 mL IVPB     2 g 200 mL/hr over 30 Minutes Intravenous Every 12 hours 01/09/19 1655     01/09/19 1300  cefTRIAXone (ROCEPHIN) 2 g in sodium chloride 0.9 % 100 mL IVPB     2 g 200 mL/hr over 30 Minutes Intravenous  Once 01/09/19 1248 01/09/19 1341   01/09/19 1300  azithromycin (ZITHROMAX) 500 mg in sodium chloride 0.9 % 250 mL IVPB  Status:  Discontinued     500 mg 250 mL/hr over 60 Minutes Intravenous Every 24 hours 01/09/19 1248 01/09/19 2007         Medications  Scheduled Meds:  azithromycin  500 mg Oral Daily   enoxaparin (LOVENOX) injection  40 mg Subcutaneous Q24H   fluticasone furoate-vilanterol  1 puff Inhalation Daily   insulin aspart  0-15 Units Subcutaneous TID WC   insulin aspart  0-5 Units Subcutaneous QHS   insulin glargine  15 Units Subcutaneous Daily   mouth rinse  15 mL Mouth Rinse q12n4p   pantoprazole  40 mg Oral Daily   potassium chloride  40 mEq Oral Q4H   predniSONE  20 mg Oral Q breakfast   umeclidinium bromide  1 puff Inhalation Daily   Continuous Infusions:  sodium chloride 10 mL/hr at 01/11/19 1100   ceFEPime (MAXIPIME) IV Stopped (01/12/19 0700)   potassium chloride 10 mEq (01/12/19 1033)   PRN Meds:.sodium chloride, acetaminophen, albuterol, ondansetron (ZOFRAN) IV      Subjective:   Dublin Grayer was seen and examined today.  Denies any specific complaints, no fevers.  Feeling better from the time of admission.  Shortness of breath is improving.  No coughing during my encounter.  Patient denies dizziness, chest pain, abdominal pain, N/V/D/C, new weakness, numbess, tingling. No acute events overnight.    Objective:   Vitals:   01/11/19 1511 01/11/19 2000 01/12/19 0745 01/12/19 0851  BP: (!) 148/78 128/68 140/74   Pulse: 88 97 92 (!) 114  Resp: 20  (!) 23 (!) 27  Temp: 98.1 F (36.7  C) 98.1 F (36.7 C) 98.9 F (37.2 C)   TempSrc:  Oral Oral   SpO2: 96% 93% 99% 92%  Weight:      Height:        Intake/Output Summary (Last 24 hours) at 01/12/2019 1035 Last data filed at 01/12/2019 0831 Gross per 24 hour  Intake 120.14 ml  Output 900 ml  Net -779.86 ml     Wt Readings from Last 3 Encounters:  01/11/19 76.4 kg  12/09/18 83.8  kg  10/29/18 83.5 kg     Exam  General: Alert and oriented x 3, NAD  Eyes:   HEENT:  Atraumatic, normocephalic  Cardiovascular: S1 S2 auscultated,  Regular rate and rhythm.  Respiratory: Clear to auscultation bilaterally, no wheezing or rhonchi  Gastrointestinal: Soft, nontender, nondistended, + bowel sounds  Ext: no pedal edema bilaterally  Neuro: No new deficits  Musculoskeletal: No digital cyanosis, clubbing  Skin: No rashes  Psych: Normal affect and demeanor, alert and oriented x3    Data Reviewed:  I have personally reviewed following labs and imaging studies  Micro Results Recent Results (from the past 240 hour(s))  Blood culture (routine x 2)     Status: None (Preliminary result)   Collection Time: 01/09/19 12:06 PM  Result Value Ref Range Status   Specimen Description   Final    BLOOD RIGHT ARM UPPER Performed at [facility], [address], [place], [place] [address]    Special Requests   Final    BOTTLES DRAWN AEROBIC AND ANAEROBIC Blood Culture adequate volume Performed at [facility], [address], [place], [place] [address]    Culture  Setup Time   Final    GRAM POSITIVE RODS AEROBIC BOTTLE ONLY Gram Stain Report Called to,Read Back By and Verified With: Azucena C. AT [facility] AT 1020A ON [id number] BY THOMPSON S.    Culture   Final    CULTURE REINCUBATED FOR BETTER GROWTH Performed at [facility], [address], [place], [place] [address]    Report Status PENDING  Incomplete  Culture, blood (Routine X 2) w Reflex to ID Panel     Status: None (Preliminary result)   Collection Time:  01/09/19  2:15 PM  Result Value Ref Range Status   Specimen Description BLOOD RIGHT BRACHIAL  Final   Special Requests AEROBIC BOTTLE ONLY Blood Culture adequate volume  Final   Culture   Final    NO GROWTH 3 DAYS Performed at [facility], [address] [place], [place] [address]    Report Status PENDING  Incomplete  Respiratory Panel by PCR     Status: None   Collection Time: 01/09/19  3:58 PM  Result Value Ref Range Status   Adenovirus NOT DETECTED NOT DETECTED Final   Coronavirus 229E NOT DETECTED NOT DETECTED Final    Comment: (NOTE) The Coronavirus on the Respiratory Panel, DOES NOT test for the novel  Coronavirus (2019 nCoV)    Coronavirus HKU1 NOT DETECTED NOT DETECTED Final   Coronavirus NL63 NOT DETECTED NOT DETECTED Final   Coronavirus OC43 NOT DETECTED NOT DETECTED Final   Metapneumovirus NOT DETECTED NOT DETECTED Final   Rhinovirus / Enterovirus NOT DETECTED NOT DETECTED Final   Influenza A NOT DETECTED NOT DETECTED Final   Influenza B NOT DETECTED NOT DETECTED Final   Parainfluenza Virus 1 NOT DETECTED NOT DETECTED Final   Parainfluenza Virus 2 NOT DETECTED NOT DETECTED Final   Parainfluenza Virus 3 NOT DETECTED NOT DETECTED Final   Parainfluenza Virus 4 NOT DETECTED NOT DETECTED Final   Respiratory Syncytial Virus NOT DETECTED NOT DETECTED Final   Bordetella pertussis NOT DETECTED NOT DETECTED Final   Chlamydophila pneumoniae NOT DETECTED NOT DETECTED Final   Mycoplasma pneumoniae NOT DETECTED NOT DETECTED Final    Comment: Performed at [facility], [address] [place], [place] [address]  MRSA PCR Screening     Status: None   Collection Time: 01/10/19  2:11 AM  Result  Value Ref Range Status   MRSA by PCR NEGATIVE NEGATIVE Final    Comment:        The GeneXpert MRSA Assay (FDA approved for NASAL specimens only), is one component of a comprehensive MRSA colonization surveillance program. It is not intended to diagnose MRSA infection nor to guide  or monitor treatment for MRSA infections. Performed at [facility], [address] [place], [place] [address]   Culture, respiratory (non-expectorated)     Status: None   Collection Time: 01/10/19  8:49 AM  Result Value Ref Range Status   Specimen Description TRACHEAL ASPIRATE  Final   Special Requests NONE  Final   Gram Stain   Final    RARE WBC PRESENT,BOTH PMN AND MONONUCLEAR NO ORGANISMS SEEN    Culture   Final    RARE Consistent with normal respiratory flora. Performed at [facility], [address] [place], [place] [address]    Report Status 01/12/2019 FINAL  Final    Radiology Reports Dg Chest Port 1 View  Result Date: 01/12/2019 CLINICAL DATA:  Respiratory failure EXAM: PORTABLE CHEST 1 VIEW COMPARISON:  01/09/2019 FINDINGS: Heart is normal size. Diffuse airspace disease throughout the left lung, most confluent in the left upper lobe. This is unchanged. No confluent opacity on the right. Heart is normal size. IMPRESSION: Diffuse left lung airspace disease, unchanged since prior study. Electronically Signed   By: Charlett Nose M.D.   On: 01/12/2019 08:08   Dg Chest Portable 1 View  Result Date: 01/09/2019 CLINICAL DATA:  Hypoxia EXAM: PORTABLE CHEST 1 VIEW COMPARISON:  January 09, 2019 study obtained earlier in the day FINDINGS: Endotracheal tube tip is 4.0 cm above the carina. Nasogastric tube tip is in the stomach. Side port not well seen but probably near the gastroesophageal junction. No pneumothorax evident. : There is airspace consolidation throughout portions of the left upper and left lower lobe. There is mild right base atelectasis. The right lung is otherwise clear. Heart size and pulmonary vascularity are normal. No adenopathy. No bone lesions. IMPRESSION: Tube positions as described without pneumothorax. Extensive airspace opacity felt to represent multifocal pneumonia throughout left upper and lower lobes. Mild right base atelectasis. Heart size normal.  Electronically Signed   By: Bretta Bang III M.D.   On: 01/09/2019 18:22   Dg Chest Port 1 View  Result Date: 01/09/2019 CLINICAL DATA:  Decreased level of consciousness and shortness of breath EXAM: PORTABLE CHEST 1 VIEW COMPARISON:  12/09/2018 FINDINGS: Cardiac shadows within normal limits. Lungs are hyperinflated similar to that seen on the prior exam. Patchy infiltrate is noted in the left upper lobe and to a lesser degree in the left lower lobe consistent with multifocal pneumonia. No sizable effusion is seen. No bony abnormality is noted. IMPRESSION: Patchy multifocal pneumonia within the left lung as described. COPD. Electronically Signed   By: Alcide Clever M.D.   On: 01/09/2019 12:46    Lab Data:  CBC: Recent Labs  Lab 01/09/19 1206 01/09/19 2059 01/09/19 2113 01/10/19 0617 01/12/19 0328  WBC 25.9* 24.4*  --  21.2* 21.9*  NEUTROABS  --   --   --   --  18.2*  HGB 13.5 11.7* 11.9* 10.9* 10.6*  HCT 43.3 38.7* 35.0* 34.9* 32.9*  MCV 87.7 87.2  --  86.2 85.5  PLT 528* 426*  --  398 386   Basic Metabolic Panel: Recent Labs  Lab 01/10/19 0038 01/10/19 0617 01/10/19 0981 01/11/19 0319 01/12/19 1914  NA 134* 139 141 143 141  K 5.4* 3.6 3.5 3.7 2.9*  CL 91* 95* 98 102 99  CO2 24 32 32 31 32  GLUCOSE 401* 225* 143* 226* 174*  BUN 24* 22 22 22 16   CREATININE 1.13 1.02 1.02 1.02 0.85  CALCIUM 9.5 10.3 10.4* 9.7 9.4  MG  --  2.6*  --  2.1 2.0  PHOS  --  1.5*  --  2.7 2.7   GFR: Estimated Creatinine Clearance: 87.8 mL/min (by C-G formula based on SCr of 0.85 mg/dL). Liver Function Tests: No results for input(s): AST, ALT, ALKPHOS, BILITOT, PROT, ALBUMIN in the last 168 hours. No results for input(s): LIPASE, AMYLASE in the last 168 hours. No results for input(s): AMMONIA in the last 168 hours. Coagulation Profile: No results for input(s): INR, PROTIME in the last 168 hours. Cardiac Enzymes: Recent Labs  Lab 01/09/19 1206  TROPONINI 0.05*   BNP (last 3  results) No results for input(s): PROBNP in the last 8760 hours. HbA1C: No results for input(s): HGBA1C in the last 72 hours. CBG: Recent Labs  Lab 01/11/19 0813 01/11/19 1225 01/11/19 1506 01/11/19 2155 01/12/19 0736  GLUCAP 259* 215* 247* 162* 145*   Lipid Profile: Recent Labs    01/09/19 2059  TRIG 177*   Thyroid Function Tests: No results for input(s): TSH, T4TOTAL, FREET4, T3FREE, THYROIDAB in the last 72 hours. Anemia Panel: No results for input(s): VITAMINB12, FOLATE, FERRITIN, TIBC, IRON, RETICCTPCT in the last 72 hours. Urine analysis:    Component Value Date/Time   COLORURINE YELLOW 01/09/2019 1809   APPEARANCEUR HAZY (A) 01/09/2019 1809   LABSPEC 1.028 01/09/2019 1809   PHURINE 5.0 01/09/2019 1809   GLUCOSEU >=500 (A) 01/09/2019 1809   HGBUR NEGATIVE 01/09/2019 1809   BILIRUBINUR NEGATIVE 01/09/2019 1809   KETONESUR 80 (A) 01/09/2019 1809   PROTEINUR NEGATIVE 01/09/2019 1809   UROBILINOGEN 0.2 02/08/2008 1731   NITRITE NEGATIVE 01/09/2019 1809   LEUKOCYTESUR NEGATIVE 01/09/2019 1809     Keshawna Dix M.D. [facility] 01/12/2019, 10:35 AM  Pager: [phone] Between 7am to 7pm - call Pager - [phone]  After 7pm go to [email or web address] - password [email or web address]  Call night coverage person covering after 7pm

## 2019-01-12 NOTE — Progress Notes (Signed)
Pharmacy Antibiotic Note  Jeff Ray is a 63 y.o. male admitted on 01/09/2019 with pneumonia.  Pharmacy has been consulted for cefepime dosing.  Plan: Continue Cefepime 2gm IV q12h (day 4) F/u clinical course, LOT, and deescalation  Height: 5\' 9"  (175.3 cm) Weight: 168 lb 6.9 oz (76.4 kg) IBW/kg (Calculated) : 70.7  Temp (24hrs), Avg:98.4 F (36.9 C), Min:98.1 F (36.7 C), Max:98.9 F (37.2 C)  Recent Labs  Lab 01/09/19 1206 01/09/19 1300 01/09/19 1720 01/09/19 2059 01/10/19 0038 01/10/19 0617 01/10/19 0943 01/11/19 0319 01/12/19 0328  WBC 25.9*  --   --  24.4*  --  21.2*  --   --  21.9*  CREATININE 1.18  --   --  1.27* 1.13 1.02 1.02 1.02 0.85  LATICACIDVEN  --  2.5* 1.7  --   --   --   --   --   --     Estimated Creatinine Clearance: 87.8 mL/min (by C-G formula based on SCr of 0.85 mg/dL).    No Known Allergies  Antimicrobials this admission: 4/7 azithromycin x 1 4/7 ceftriaxone x 1 4/7cefepime >> 4/7 vancomycin >>4/8  Microbiology results: 4/7 BCx: NGTD 4/7 COVID:IP  4/7 resp PCR: neg  - rhinovirus NOT detected this admit - detected 4 months ago  4/7 flu: neg  4/8 MRSA: neg  4/8 strep: neg  4/8 leg: IP  4/8 Sputum cx: no orgs seen  Artie Takayama A. Jeanella Craze, PharmD, BCPS Clinical Pharmacist [place] Please utilize Amion for appropriate phone number to reach the unit pharmacist [facility])    01/12/2019 9:51 AM

## 2019-01-13 ENCOUNTER — Encounter (HOSPITAL_COMMUNITY): Payer: Self-pay

## 2019-01-13 LAB — CBC
HCT: 34.3 % — ABNORMAL LOW (ref 39.0–52.0)
Hemoglobin: 10.6 g/dL — ABNORMAL LOW (ref 13.0–17.0)
MCH: 26.6 pg (ref 26.0–34.0)
MCHC: 30.9 g/dL (ref 30.0–36.0)
MCV: 86 fL (ref 80.0–100.0)
Platelets: 330 10*3/uL (ref 150–400)
RBC: 3.99 MIL/uL — ABNORMAL LOW (ref 4.22–5.81)
RDW: 14.1 % (ref 11.5–15.5)
WBC: 24.5 10*3/uL — ABNORMAL HIGH (ref 4.0–10.5)
nRBC: 0 % (ref 0.0–0.2)

## 2019-01-13 LAB — BASIC METABOLIC PANEL
Anion gap: 13 (ref 5–15)
BUN: 12 mg/dL (ref 8–23)
CO2: 27 mmol/L (ref 22–32)
Calcium: 9.3 mg/dL (ref 8.9–10.3)
Chloride: 96 mmol/L — ABNORMAL LOW (ref 98–111)
Creatinine, Ser: 0.9 mg/dL (ref 0.61–1.24)
GFR calc Af Amer: >60 mL/min (ref >60)
GFR calc non Af Amer: >60 mL/min (ref >60)
Glucose, Bld: 135 mg/dL — ABNORMAL HIGH (ref 70–99)
Potassium: 3.8 mmol/L (ref 3.5–5.1)
Sodium: 136 mmol/L (ref 135–145)

## 2019-01-13 LAB — CULTURE, BLOOD (ROUTINE X 2): Special Requests: ADEQUATE

## 2019-01-13 LAB — GLUCOSE, CAPILLARY
Glucose-Capillary: 133 mg/dL — ABNORMAL HIGH (ref 70–99)
Glucose-Capillary: 137 mg/dL — ABNORMAL HIGH (ref 70–99)
Glucose-Capillary: 149 mg/dL — ABNORMAL HIGH (ref 70–99)
Glucose-Capillary: 212 mg/dL — ABNORMAL HIGH (ref 70–99)
Glucose-Capillary: 90 mg/dL (ref 70–99)

## 2019-01-13 NOTE — Progress Notes (Signed)
[facility]                                                                              Patient Demographics  Jeff LazierKarl Ray, is a 63 y.o. male, DOB - 06/14/1956, ZOX:[id number]  Admit date - 01/09/2019   Admitting Physician Erick BlinksJehanzeb Memon, MD  Outpatient Primary MD for the patient is Jeff MorganKnowlton, [name], MD  Outpatient specialists:   LOS - 4  days   Medical records reviewed and are as summarized below:    Chief Complaint  Patient presents with   Respiratory Distress       Brief summary   Jeff ImusKarl E Ray is a 63 y.o. male who [facility] with acute on chronic hypoxic and hypercapnic respiratory failure, likely due to COPD exacerbation (has had mutliple admissions for this in the past).  Was deemed to be a COVID-19 rule out per ID; therefore, [facility] intubation [facility] (was on Bi[facility]AP prior). Patient was extubated on 01/10/2019, [facility] resumed care on 01/12/2019  COVID-19 results pending  Assessment & Plan    Principal Problem:   Acute on chronic respiratory failure with hypercapnia and hypoxia (HCC), suspected COVID, possible COPD exacerbation, HCAP -Patient had [facility] to [facility] ED with shortness of breath, that had progressed over the prior 5 days, chronic cough productive of clear sputum, no known sick contacts.   -In ED, noted to be hypercapnic and was placed on BiPAP, chest x-Ray showed multifocal pneumonia.  Due to him being on BiPAP, aerosolization, he was ultimately intubated. -Influenza panel negative, RVP negative, strep antigen negative, urine Legionella antigen negative.  Blood cultures negative till date.  COVID-19 results pending, called micro lab to expedite the results, collected on 01/09/2019 -Patient was extubated on 01/10/2019, O2 sats 98% on 5L -Wean O2 as tolerated, continue Breo, Incruse, wean prednisone -Continue Zithromax, cefepime IV.  If covid positive, will place on Plaquenil   Active Problems:   COPD  exacerbation (HCC) -stable no wheezing, still hypoxic O2 sats 98% on 5 L, will wean down, normally at home on 2 to 3 L. - continue antibiotics, O2 as tolerated -Wean prednisone, continue Incruse, Breo -Continue Claritin, Singulair, Flonase spray daily (patient is on Allegra, Singulair at home.)    Diabetic ketoacidosis with type 2 diabetes mellitus, uncontrolled (HCC) -Presented with DKA, was placed on insulin drip DKA protocol Hemoglobin A1c 10.0 on 12/09/2018 -Continue Lantus 15 units daily, NovoLog meal coverage 3 units 3 times daily AC, sliding scale insulin -CBGs currently stable, continue current insulin regimen  Hypokalemia Potassium stable 3.8    HLD (hyperlipidemia) -Continue Crestor    Acute metabolic encephalopathy Now resolved, patient is alert and oriented, likely due to #1  GERD Continue PPI  Generalized deconditioning PT OT evaluation  Patient Isolation: Contact + airborne HCP PPE: Wearing all recommended PPE CAPR + Gown + Gloves + Surgical Cap+ shoe care  Code Status: Full CODE STATUS DVT Prophylaxis:  Lovenox Family Communication: Discussed in detail with the patient, all imaging results, lab results explained to the patient's wife    Disposition Plan: likely in 24 - 48hrs   Time  Spent in minutes: 25 mins   Procedures:  Intubation on 01/09/2019, extubated on 4/8  Consultants:   PCCM  Antimicrobials:   Anti-infectives (From admission, onward)   Start     Dose/Rate Route Frequency Ordered Stop   01/11/19 0821  azithromycin (ZITHROMAX) tablet 500 mg     500 mg Oral Daily 01/11/19 0822 01/13/19 0838   01/10/19 1000  azithromycin (ZITHROMAX) 500 mg in sodium chloride 0.9 % 250 mL IVPB  Status:  Discontinued     500 mg 250 mL/hr over 60 Minutes Intravenous Every 24 hours 01/10/19 0842 01/11/19 0822   01/10/19 0500  vancomycin (VANCOCIN) IVPB 750 mg/150 ml premix  Status:  Discontinued     750 mg 150 mL/hr over 60 Minutes Intravenous Every 12 hours  01/09/19 1655 01/10/19 0842   01/09/19 1700  vancomycin (VANCOCIN) IVPB 1000 mg/200 mL premix     1,000 mg 200 mL/hr over 60 Minutes Intravenous  Once 01/09/19 1655 01/09/19 1915   01/09/19 1700  ceFEPIme (MAXIPIME) 2 g in sodium chloride 0.9 % 100 mL IVPB     2 g 200 mL/hr over 30 Minutes Intravenous Every 12 hours 01/09/19 1655     01/09/19 1300  cefTRIAXone (ROCEPHIN) 2 g in sodium chloride 0.9 % 100 mL IVPB     2 g 200 mL/hr over 30 Minutes Intravenous  Once 01/09/19 1248 01/09/19 1341   01/09/19 1300  azithromycin (ZITHROMAX) 500 mg in sodium chloride 0.9 % 250 mL IVPB  Status:  Discontinued     500 mg 250 mL/hr over 60 Minutes Intravenous Every 24 hours 01/09/19 1248 01/09/19 2007         Medications  Scheduled Meds:  enoxaparin (LOVENOX) injection  40 mg Subcutaneous Q24H   fluticasone  2 spray Each Nare Daily   fluticasone furoate-vilanterol  1 puff Inhalation Daily   insulin aspart  0-15 Units Subcutaneous TID WC   insulin aspart  0-5 Units Subcutaneous QHS   insulin aspart  3 Units Subcutaneous TID WC   insulin glargine  15 Units Subcutaneous Daily   loratadine  10 mg Oral Daily   mouth rinse  15 mL Mouth Rinse q12n4p   montelukast  10 mg Oral QHS   oxybutynin  5 mg Oral TID   pantoprazole  40 mg Oral Daily   rosuvastatin  40 mg Oral QHS   umeclidinium bromide  1 puff Inhalation Daily   Continuous Infusions:  sodium chloride 10 mL/hr at 01/11/19 1100   ceFEPime (MAXIPIME) IV 2 g (01/13/19 0841)   PRN Meds:.sodium chloride, acetaminophen, albuterol, ondansetron (ZOFRAN) IV      Subjective:   Seng Larch was seen and examined today.  Doing better, no fevers or chills, asking when he is going home.  Shortness of breath is improving, still on 5 L O2.  Patient denies dizziness, chest pain, abdominal pain, N/V/D/C, new weakness, numbess, tingling. No acute events overnight.    Objective:   Vitals:   01/12/19 2300 01/13/19 0330 01/13/19 0806  01/13/19 1200  BP: (!) 126/94 136/62 132/67 (!) 116/50  Pulse: (!) 111 100 (!) 102 (!) 107  Resp: (!) 33 (!) 26 (!) 32 (!) 37  Temp: 98.3 F (36.8 C) 98.3 F (36.8 C) 98.2 F (36.8 C) 98.2 F (36.8 C)  TempSrc: Oral Oral Oral Oral  SpO2: 95% 90% 92% 90%  Weight:      Height:        Intake/Output Summary (Last 24 hours) at 01/13/2019 1557  Last data filed at 01/13/2019 0332 Gross per 24 hour  Intake --  Output 1500 ml  Net -1500 ml     Wt Readings from Last 3 Encounters:  01/11/19 76.4 kg  12/09/18 83.8 kg  10/29/18 83.5 kg    Physical Exam  General: Alert and oriented x 3, NAD  Eyes:   HEENT:    Cardiovascular: S1 S2 clear, no murmurs, RRR. No pedal edema b/l  Respiratory: Bilateral scattered rhonchi  Gastrointestinal: Soft, nontender, nondistended, NBS  Ext: no pedal edema bilaterally  Neuro: no new deficits  Musculoskeletal: No cyanosis, clubbing  Skin: No rashes  Psych: Normal affect and demeanor, alert and oriented x3    Data Reviewed:  I have personally reviewed following labs and imaging studies  Micro Results Recent Results (from the past 240 hour(s))  Blood culture (routine x 2)     Status: Abnormal   Collection Time: 01/09/19 12:06 PM  Result Value Ref Range Status   Specimen Description   Final    BLOOD RIGHT ARM UPPER Performed at [facility], [address] [place], [place] [address]    Special Requests   Final    BOTTLES DRAWN AEROBIC AND ANAEROBIC Blood Culture adequate volume Performed at [facility], [address] [place], [place] [address]    Culture  Setup Time   Final    GRAM POSITIVE RODS AEROBIC BOTTLE ONLY Gram Stain Report Called to,Read Back By and Verified With: [place] C. AT [facility] AT 1020A ON [id number] BY THOMPSON S.    Culture (A)  Final    DIPHTHEROIDS(CORYNEBACTERIUM SPECIES) Standardized susceptibility testing for this organism is not available. Performed at [facility], [address]  [place], [place] [address]    Report Status 01/13/2019 FINAL  Final  Culture, blood (Routine X 2) w Reflex to ID Panel     Status: None (Preliminary result)   Collection Time: 01/09/19  2:15 PM  Result Value Ref Range Status   Specimen Description BLOOD RIGHT BRACHIAL  Final   Special Requests AEROBIC BOTTLE ONLY Blood Culture adequate volume  Final   Culture   Final    NO GROWTH 4 DAYS Performed at [facility], [address] [place], [place] [address]    Report Status PENDING  Incomplete  Respiratory Panel by PCR     Status: None   Collection Time: 01/09/19  3:58 PM  Result Value Ref Range Status   Adenovirus NOT DETECTED NOT DETECTED Final   Coronavirus 229E NOT DETECTED NOT DETECTED Final    Comment: (NOTE) The Coronavirus on the Respiratory Panel, DOES NOT test for the novel  Coronavirus (2019 nCoV)    Coronavirus HKU1 NOT DETECTED NOT DETECTED Final   Coronavirus NL63 NOT DETECTED NOT DETECTED Final   Coronavirus OC43 NOT DETECTED NOT DETECTED Final   Metapneumovirus NOT DETECTED NOT DETECTED Final   Rhinovirus / Enterovirus NOT DETECTED NOT DETECTED Final   Influenza A NOT DETECTED NOT DETECTED Final   Influenza B NOT DETECTED NOT DETECTED Final   Parainfluenza Virus 1 NOT DETECTED NOT DETECTED Final   Parainfluenza Virus 2 NOT DETECTED NOT DETECTED Final   Parainfluenza Virus 3 NOT DETECTED NOT DETECTED Final   Parainfluenza Virus 4 NOT DETECTED NOT DETECTED Final   Respiratory Syncytial Virus NOT DETECTED NOT DETECTED Final   Bordetella pertussis NOT DETECTED NOT DETECTED Final   Chlamydophila pneumoniae NOT DETECTED NOT DETECTED Final   Mycoplasma pneumoniae NOT DETECTED NOT DETECTED Final  Comment: Performed at [facility], [address], [place], [place] [address]  MRSA PCR Screening     Status: None   Collection Time: 01/10/19  2:11 AM  Result Value Ref Range Status   MRSA by PCR NEGATIVE NEGATIVE Final    Comment:        The GeneXpert MRSA Assay  (FDA approved for NASAL specimens only), is one component of a comprehensive MRSA colonization surveillance program. It is not intended to diagnose MRSA infection nor to guide or monitor treatment for MRSA infections. Performed at [facility], [address], [place], [place] [address]   Culture, respiratory (non-expectorated)     Status: None   Collection Time: 01/10/19  8:49 AM  Result Value Ref Range Status   Specimen Description TRACHEAL ASPIRATE  Final   Special Requests NONE  Final   Gram Stain   Final    RARE WBC PRESENT,BOTH PMN AND MONONUCLEAR NO ORGANISMS SEEN    Culture   Final    RARE Consistent with normal respiratory flora. Performed at [facility], [address] [place], [place] [address]    Report Status 01/12/2019 FINAL  Final    Radiology Reports Dg Chest Port 1 View  Result Date: 01/12/2019 CLINICAL DATA:  Respiratory failure EXAM: PORTABLE CHEST 1 VIEW COMPARISON:  01/09/2019 FINDINGS: Heart is normal size. Diffuse airspace disease throughout the left lung, most confluent in the left upper lobe. This is unchanged. No confluent opacity on the right. Heart is normal size. IMPRESSION: Diffuse left lung airspace disease, unchanged since prior study. Electronically Signed   By: Charlett Nose M.D.   On: 01/12/2019 08:08   Dg Chest Portable 1 View  Result Date: 01/09/2019 CLINICAL DATA:  Hypoxia EXAM: PORTABLE CHEST 1 VIEW COMPARISON:  January 09, 2019 study obtained earlier in the day FINDINGS: Endotracheal tube tip is 4.0 cm above the carina. Nasogastric tube tip is in the stomach. Side port not well seen but probably near the gastroesophageal junction. No pneumothorax evident. : There is airspace consolidation throughout portions of the left upper and left lower lobe. There is mild right base atelectasis. The right lung is otherwise clear. Heart size and pulmonary vascularity are normal. No adenopathy. No bone lesions. IMPRESSION: Tube positions as  described without pneumothorax. Extensive airspace opacity felt to represent multifocal pneumonia throughout left upper and lower lobes. Mild right base atelectasis. Heart size normal. Electronically Signed   By: Bretta Bang III M.D.   On: 01/09/2019 18:22   Dg Chest Port 1 View  Result Date: 01/09/2019 CLINICAL DATA:  Decreased level of consciousness and shortness of breath EXAM: PORTABLE CHEST 1 VIEW COMPARISON:  12/09/2018 FINDINGS: Cardiac shadows within normal limits. Lungs are hyperinflated similar to that seen on the prior exam. Patchy infiltrate is noted in the left upper lobe and to a lesser degree in the left lower lobe consistent with multifocal pneumonia. No sizable effusion is seen. No bony abnormality is noted. IMPRESSION: Patchy multifocal pneumonia within the left lung as described. COPD. Electronically Signed   By: Alcide Clever M.D.   On: 01/09/2019 12:46    Lab Data:  CBC: Recent Labs  Lab 01/09/19 1206 01/09/19 2059 01/09/19 2113 01/10/19 0617 01/12/19 0328 01/13/19 0514  WBC 25.9* 24.4*  --  21.2* 21.9* 24.5*  NEUTROABS  --   --   --   --  18.2*  --   HGB 13.5 11.7* 11.9* 10.9* 10.6* 10.6*  HCT 43.3  38.7* 35.0* 34.9* 32.9* 34.3*  MCV 87.7 87.2  --  86.2 85.5 86.0  PLT 528* 426*  --  398 386 330   Basic Metabolic Panel: Recent Labs  Lab 01/10/19 0617 01/10/19 0943 01/11/19 0319 01/12/19 0328 01/13/19 0514  NA 139 141 143 141 136  K 3.6 3.5 3.7 2.9* 3.8  CL 95* 98 102 99 96*  CO2 32 32 31 32 27  GLUCOSE 225* 143* 226* 174* 135*  BUN CREATININE 1.02 1.02 1.02 0.85 0.90  CALCIUM 10.3 10.4* 9.7 9.4 9.3  MG 2.6*  --  2.1 2.0  --   PHOS 1.5*  --  2.7 2.7  --    GFR: Estimated Creatinine Clearance: 82.9 mL/min (by C-G formula based on SCr of 0.9 mg/dL). Liver Function Tests: No results for input(s): AST, ALT, ALKPHOS, BILITOT, PROT, ALBUMIN in the last 168 hours. No results for input(s): LIPASE, AMYLASE in the last 168 hours. No  results for input(s): AMMONIA in the last 168 hours. Coagulation Profile: No results for input(s): INR, PROTIME in the last 168 hours. Cardiac Enzymes: Recent Labs  Lab 01/09/19 1206  TROPONINI 0.05*   BNP (last 3 results) No results for input(s): PROBNP in the last 8760 hours. HbA1C: No results for input(s): HGBA1C in the last 72 hours. CBG: Recent Labs  Lab 01/12/19 1600 01/12/19 2003 01/13/19 0023 01/13/19 0828 01/13/19 1200  GLUCAP 289* 138* 90 137* 133*   Lipid Profile: No results for input(s): CHOL, HDL, LDLCALC, TRIG, CHOLHDL, LDLDIRECT in the last 72 hours. Thyroid Function Tests: No results for input(s): TSH, T4TOTAL, FREET4, T3FREE, THYROIDAB in the last 72 hours. Anemia Panel: No results for input(s): VITAMINB12, FOLATE, FERRITIN, TIBC, IRON, RETICCTPCT in the last 72 hours. Urine analysis:    Component Value Date/Time   COLORURINE YELLOW 01/09/2019 1809   APPEARANCEUR HAZY (A) 01/09/2019 1809   LABSPEC 1.028 01/09/2019 1809   PHURINE 5.0 01/09/2019 1809   GLUCOSEU >=500 (A) 01/09/2019 1809   HGBUR NEGATIVE 01/09/2019 1809   BILIRUBINUR NEGATIVE 01/09/2019 1809   KETONESUR 80 (A) 01/09/2019 1809   PROTEINUR NEGATIVE 01/09/2019 1809   UROBILINOGEN 0.2 02/08/2008 1731   NITRITE NEGATIVE 01/09/2019 1809   LEUKOCYTESUR NEGATIVE 01/09/2019 1809     Lissa Rowles M.D. [facility] 01/13/2019, 3:57 PM  Pager: [phone] Between 7am to 7pm - call Pager - [phone]  After 7pm go to [email or web address] - password [email or web address]  Call night coverage person covering after 7pm

## 2019-01-14 ENCOUNTER — Inpatient Hospital Stay (HOSPITAL_COMMUNITY): Payer: Medicare HMO

## 2019-01-14 LAB — BASIC METABOLIC PANEL
Anion gap: 12 (ref 5–15)
BUN: 12 mg/dL (ref 8–23)
CO2: 29 mmol/L (ref 22–32)
Calcium: 9.4 mg/dL (ref 8.9–10.3)
Chloride: 94 mmol/L — ABNORMAL LOW (ref 98–111)
Creatinine, Ser: 0.9 mg/dL (ref 0.61–1.24)
GFR calc Af Amer: >60 mL/min (ref >60)
GFR calc non Af Amer: >60 mL/min (ref >60)
Glucose, Bld: 127 mg/dL — ABNORMAL HIGH (ref 70–99)
Potassium: 3.8 mmol/L (ref 3.5–5.1)
Sodium: 135 mmol/L (ref 135–145)

## 2019-01-14 LAB — CBC
HCT: 33.9 % — ABNORMAL LOW (ref 39.0–52.0)
Hemoglobin: 10.4 g/dL — ABNORMAL LOW (ref 13.0–17.0)
MCH: 26.5 pg (ref 26.0–34.0)
MCHC: 30.7 g/dL (ref 30.0–36.0)
MCV: 86.3 fL (ref 80.0–100.0)
Platelets: 299 10*3/uL (ref 150–400)
RBC: 3.93 MIL/uL — ABNORMAL LOW (ref 4.22–5.81)
RDW: 14.1 % (ref 11.5–15.5)
WBC: 24.6 10*3/uL — ABNORMAL HIGH (ref 4.0–10.5)
nRBC: 0 % (ref 0.0–0.2)

## 2019-01-14 LAB — GLUCOSE, CAPILLARY
Glucose-Capillary: 125 mg/dL — ABNORMAL HIGH (ref 70–99)
Glucose-Capillary: 118 mg/dL — ABNORMAL HIGH (ref 70–99)
Glucose-Capillary: 123 mg/dL — ABNORMAL HIGH (ref 70–99)
Glucose-Capillary: 67 mg/dL — ABNORMAL LOW (ref 70–99)
Glucose-Capillary: 137 mg/dL — ABNORMAL HIGH (ref 70–99)

## 2019-01-14 LAB — CULTURE, BLOOD (ROUTINE X 2)
Culture: NO GROWTH
Special Requests: ADEQUATE

## 2019-01-14 LAB — NOVEL CORONAVIRUS, NAA (HOSP ORDER, SEND-OUT TO REF LAB; TAT 18-24 HRS): SARS-CoV-2, NAA: NOT DETECTED

## 2019-01-14 MED ORDER — ALPRAZOLAM 0.5 MG PO TABS
1.0000 mg | ORAL_TABLET | Freq: Once | ORAL | Status: AC
  Administered 2019-01-14: 1 mg via ORAL
  Filled 2019-01-14: qty 2

## 2019-01-14 MED ORDER — IPRATROPIUM-ALBUTEROL 0.5-2.5 (3) MG/3ML IN SOLN
3.0000 mL | RESPIRATORY_TRACT | Status: DC | PRN
  Administered 2019-01-14 – 2019-01-15 (×2): 3 mL via RESPIRATORY_TRACT
  Filled 2019-01-14: qty 3

## 2019-01-14 NOTE — Progress Notes (Signed)
Hypoglycemic Event  CBG: 67   Treatment: 8 oz juice/soda  Symptoms: None  Follow-up CBG: Time:2219 CBG Result:137  Possible Reasons for Event: Inadequate meal intake  Comments/MD notified:    Lisette Grinder Alyla Pietila

## 2019-01-14 NOTE — Progress Notes (Signed)
[facility]                                                                              Patient Demographics  Jeff Ray, is a 63 y.o. male, DOB - 02/28/1956, NWG:[id number]  Admit date - 01/09/2019   Admitting Physician Erick BlinksJehanzeb Memon, MD  Outpatient Primary MD for the patient is [name], [name], MD  Outpatient specialists:   LOS - 5  days   Medical records reviewed and are as summarized below:    Chief Complaint  Patient presents with  . Respiratory Distress       Brief summary   Jeff Ray is a 63 y.o. male who was admitted to [facility] with acute on chronic hypoxic and hypercapnic respiratory failure, likely due to COPD exacerbation (has had mutliple admissions for this in the past).  Was deemed to be a COVID-19 rule out per ID; therefore, required intubation [facility] (was on Bi[facility]AP prior). Patient was extubated on 01/10/2019, [facility] resumed care on 01/12/2019  COVID-19 results NEGATIVE  Assessment & Plan    Principal Problem:   Acute on chronic respiratory failure with hypercapnia and hypoxia (HCC), secondary to COPD exacerbation, HCAP -Patient had presented to any point hospital ED with shortness of breath, that had progressed over the prior 5 days, chronic cough productive of clear sputum, no known sick contacts.   -In ED, noted to be hypercapnic and was placed on BiPAP, chest x-ray showed multifocal pneumonia.  Due to him being on BiPAP, aerosolization, he was ultimately intubated. -Influenza panel negative, RVP negative, strep antigen negative, urine Legionella antigen negative.  Blood cultures negative till date.   - COVID-19 NEGATIVE  -Patient was extubated on 01/10/2019, O2 sats 93% on 5 L -Wean O2 as tolerated, continue Breo, Incruse, prednisone weaned off.  Continue IV cefepime -Repeat chest x-ray today showed left upper lobe and left lower lobe infiltrates compatible with pneumonia.   Active Problems:   COPD exacerbation (HCC)  -Currently no wheezing, wean O2 as tolerated, normally at home on 3 L, currently O2 sats 93% on 5 L -Weaned off prednisone, continue IV cefepime, O2 as tolerated, Incruse and Breo -Continue Claritin, Singulair, Flonase spray daily (patient is on Allegra, Singulair at home.)    Diabetic ketoacidosis with type 2 diabetes mellitus, uncontrolled (HCC) -Presented with DKA, was placed on insulin drip DKA protocol - Hemoglobin A1c 10.0 on 12/09/2018 -CBG stable, continue Lantus 15 units daily, NovoLog meal coverage 3 units 3 times daily AC, sliding scale insulin   Hypokalemia Potassium stable 3.8    HLD (hyperlipidemia) -Continue Crestor    Acute metabolic encephalopathy Now resolved, patient is alert and oriented, likely due to #1  GERD Continue PPI  Generalized deconditioning PT OT evaluation pending  Code Status: Full CODE STATUS DVT Prophylaxis:  Lovenox Family Communication: Discussed in detail with the patient, all imaging results, lab results explained to the patient's wife    Disposition Plan: likely in 24 - 48hrs, once O2 weaned down, PT OT evaluation pending  Time Spent in minutes: 25 mins   Procedures:  Intubation on 01/09/2019, extubated on 4/8  Consultants:  PCCM  Antimicrobials:   Anti-infectives (From admission, onward)   Start     Dose/Rate Route Frequency Ordered Stop   01/11/19 0821  azithromycin (ZITHROMAX) tablet 500 mg     500 mg Oral Daily 01/11/19 0822 01/13/19 0838   01/10/19 1000  azithromycin (ZITHROMAX) 500 mg in sodium chloride 0.9 % 250 mL IVPB  Status:  Discontinued     500 mg 250 mL/hr over 60 Minutes Intravenous Every 24 hours 01/10/19 0842 01/11/19 0822   01/10/19 0500  vancomycin (VANCOCIN) IVPB 750 mg/150 ml premix  Status:  Discontinued     750 mg 150 mL/hr over 60 Minutes Intravenous Every 12 hours 01/09/19 1655 01/10/19 0842   01/09/19 1700  vancomycin (VANCOCIN) IVPB 1000 mg/200 mL premix     1,000 mg 200 mL/hr over 60 Minutes  Intravenous  Once 01/09/19 1655 01/09/19 1915   01/09/19 1700  ceFEPIme (MAXIPIME) 2 g in sodium chloride 0.9 % 100 mL IVPB     2 g 200 mL/hr over 30 Minutes Intravenous Every 12 hours 01/09/19 1655     01/09/19 1300  cefTRIAXone (ROCEPHIN) 2 g in sodium chloride 0.9 % 100 mL IVPB     2 g 200 mL/hr over 30 Minutes Intravenous  Once 01/09/19 1248 01/09/19 1341   01/09/19 1300  azithromycin (ZITHROMAX) 500 mg in sodium chloride 0.9 % 250 mL IVPB  Status:  Discontinued     500 mg 250 mL/hr over 60 Minutes Intravenous Every 24 hours 01/09/19 1248 01/09/19 2007         Medications  Scheduled Meds: . enoxaparin (LOVENOX) injection  40 mg Subcutaneous Q24H  . fluticasone  2 spray Each Nare Daily  . fluticasone furoate-vilanterol  1 puff Inhalation Daily  . insulin aspart  0-15 Units Subcutaneous TID WC  . insulin aspart  0-5 Units Subcutaneous QHS  . insulin aspart  3 Units Subcutaneous TID WC  . insulin glargine  15 Units Subcutaneous Daily  . loratadine  10 mg Oral Daily  . mouth rinse  15 mL Mouth Rinse q12n4p  . montelukast  10 mg Oral QHS  . oxybutynin  5 mg Oral TID  . pantoprazole  40 mg Oral Daily  . rosuvastatin  40 mg Oral QHS  . umeclidinium bromide  1 puff Inhalation Daily   Continuous Infusions: . sodium chloride 10 mL/hr at 01/11/19 1100  . ceFEPime (MAXIPIME) IV 2 g (01/14/19 0850)   PRN Meds:.sodium chloride, albuterol      Subjective:   Jeff Ray was seen and examined today.  Feeling whole lot better, no fever or chills.  Still on 5 L O2.  No acute issues overnight.  Patient denies dizziness, chest pain, abdominal pain, N/V/D/C, new weakness, numbess, tingling.   Objective:   Vitals:   01/13/19 2300 01/14/19 0300 01/14/19 0800 01/14/19 1241  BP: 114/70 135/62 131/69 126/67  Pulse: (!) 105 (!) 106 (!) 102 (!) 105  Resp: 17 (!) 30 (!) 25 (!) 28  Temp: 98.5 F (36.9 C) 98.5 F (36.9 C) 98.2 F (36.8 C) 98.5 F (36.9 C)  TempSrc: Oral Oral Oral Oral   SpO2: 94% 90% 90% 93%  Weight:      Height:        Intake/Output Summary (Last 24 hours) at 01/14/2019 1438 Last data filed at 01/14/2019 0900 Gross per 24 hour  Intake -  Output 1550 ml  Net -1550 ml     Wt Readings from Last 3 Encounters:  01/11/19 76.4  kg  12/09/18 83.8 kg  10/29/18 83.5 kg   Physical Exam  General: Alert and oriented x 3, NAD  Eyes:   HEENT:    Cardiovascular: S1 S2 clear, RRR. No pedal edema b/l  Respiratory: bilateral scattered rhonchi  Gastrointestinal: Soft, nontender, nondistended, NBS  Ext: no pedal edema bilaterally  Neuro: no new deficits  Musculoskeletal: No cyanosis, clubbing  Skin: No rashes  Psych: Normal affect and demeanor, alert and oriented x3    Data Reviewed:  I have personally reviewed following labs and imaging studies  Micro Results Recent Results (from the past 240 hour(s))  Blood culture (routine x 2)     Status: Abnormal   Collection Time: 01/09/19 12:06 PM  Result Value Ref Range Status   Specimen Description   Final    BLOOD RIGHT ARM UPPER Performed at [facility], [address] [place], [place] [address]    Special Requests   Final    BOTTLES DRAWN AEROBIC AND ANAEROBIC Blood Culture adequate volume Performed at [facility], [address] [place], [place] [address]    Culture  Setup Time   Final    GRAM POSITIVE RODS AEROBIC BOTTLE ONLY Gram Stain Report Called to,Read Back By and Verified With: Gorey C. AT [facility] AT 1020A ON [id number] BY THOMPSON S.    Culture (A)  Final    DIPHTHEROIDS(CORYNEBACTERIUM SPECIES) Standardized susceptibility testing for this organism is not available. Performed at [facility], [address] [place], [place] [address]    Report Status 01/13/2019 FINAL  Final  Culture, blood (Routine X 2) w Reflex to ID Panel     Status: None   Collection Time: 01/09/19  2:15 PM  Result Value Ref Range Status   Specimen Description BLOOD RIGHT BRACHIAL  Final   Special  Requests AEROBIC BOTTLE ONLY Blood Culture adequate volume  Final   Culture   Final    NO GROWTH 5 DAYS Performed at [facility], [address] [place], [place] [address]    Report Status 01/14/2019 FINAL  Final  Respiratory Panel by PCR     Status: None   Collection Time: 01/09/19  3:58 PM  Result Value Ref Range Status   Adenovirus NOT DETECTED NOT DETECTED Final   Coronavirus 229E NOT DETECTED NOT DETECTED Final    Comment: (NOTE) The Coronavirus on the Respiratory Panel, DOES NOT test for the novel  Coronavirus (2019 nCoV)    Coronavirus HKU1 NOT DETECTED NOT DETECTED Final   Coronavirus NL63 NOT DETECTED NOT DETECTED Final   Coronavirus OC43 NOT DETECTED NOT DETECTED Final   Metapneumovirus NOT DETECTED NOT DETECTED Final   Rhinovirus / Enterovirus NOT DETECTED NOT DETECTED Final   Influenza A NOT DETECTED NOT DETECTED Final   Influenza B NOT DETECTED NOT DETECTED Final   Parainfluenza Virus 1 NOT DETECTED NOT DETECTED Final   Parainfluenza Virus 2 NOT DETECTED NOT DETECTED Final   Parainfluenza Virus 3 NOT DETECTED NOT DETECTED Final   Parainfluenza Virus 4 NOT DETECTED NOT DETECTED Final   Respiratory Syncytial Virus NOT DETECTED NOT DETECTED Final   Bordetella pertussis NOT DETECTED NOT DETECTED Final   Chlamydophila pneumoniae NOT DETECTED NOT DETECTED Final   Mycoplasma pneumoniae NOT DETECTED NOT DETECTED Final    Comment: Performed at [facility], [address], [place], [place] [address]  Novel Coronavirus, NAA (hospital order; send-out to ref lab)     Status: None   Collection Time: 01/09/19  4:19 PM  Result Value Ref Range Status   SARS-CoV-2, NAA NOT DETECTED NOT DETECTED Final    Comment: Negative (Not Detected) results do not exclude infection caused by SARS CoV 2 and should not be used as the sole basis for treatment or other patient management decisions. Optimum specimen types and timing for peak viral levels during infections caused  by SARS CoV 2  have not been determined. Collection of multiple specimens (types and time points) from the same patient may be necessary to detect the virus. Improper specimen collection and handling, sequence variability underlying assay primers and or probes, or the presence of organisms in  quantities less than the limit of detection of the assay may lead to false negative results. Positive and negative predictive values of testing are highly dependent on prevalence. False negative results are more likely when prevalence of disease is high. (NOTE) The expected result is Negative (Not Detected). The SARS CoV 2 test is intended for the presumptive qualitative  detection of nucleic acid from SARS CoV 2 in upper and lower  respir atory specimens. Testing methodology is real time RT PCR. Test results must be correlated with clinical presentation and  evaluated in the context of other laboratory and epidemiologic data.  Test performance can be affected because the epidemiology and  clinical spectrum of infection caused by SARS CoV 2 is not fully  known. For example, the optimum types of specimens to collect and  when during the course of infection these specimens are most likely  to contain detectable viral RNA may not be known. This test has not been Food and Drug Administration (FDA) cleared or  approved and has been authorized by FDA under an Emergency Use  Authorization (EUA). The test is only authorized for the duration of  the declaration that circumstances exist justifying the authorization  of emergency use of in vitro diagnostic tests for detection and or  diagnosis of SARS CoV 2 under Section 564(b)(1) of the Act, 21 U.S.C.  section [phone] 3(b)(1), unless the authorization is terminated or   revoked sooner. [facility] is certified under the  Clinical Laboratory Improvement Amendments of 1988 (CLIA), 42 U.S.C.  section [phone], to perform high complexity tests. Performed at [facility] CLIA [id number] [address], [place], [place] [address] Laboratory Director: Turner Daniels, MD Fact Sheet for Healthcare Providers  https://pope.com/ Fact Sheet for Patients  [email or web address] Performed at [facility], [address], [place], [place] [address]    Coronavirus Source NASOPHARYNGEAL  Final    Comment: Performed at [facility], [address], [place], [place] [address]  MRSA PCR Screening     Status: None   Collection Time: 01/10/19  2:11 AM  Result Value Ref Range Status   MRSA by PCR NEGATIVE NEGATIVE Final    Comment:        The GeneXpert MRSA Assay (FDA approved for NASAL specimens only), is one component of a comprehensive MRSA colonization surveillance program. It is not intended to diagnose MRSA infection nor to guide or monitor treatment for MRSA infections. Performed at [facility], [address], [place], [place] [address]   Culture, respiratory (non-expectorated)     Status: None   Collection Time: 01/10/19  8:49 AM  Result Value Ref Range Status   Specimen Description TRACHEAL ASPIRATE  Final   Special Requests NONE  Final   Gram Stain   Final  RARE WBC PRESENT,BOTH PMN AND MONONUCLEAR NO ORGANISMS SEEN    Culture   Final    RARE Consistent with normal respiratory flora. Performed at [facility], [address], [place], [place] [address]    Report Status 01/12/2019 FINAL  Final    Radiology Reports Dg Chest Port 1 View  Result Date: 01/14/2019 CLINICAL DATA:  Dyspnea. EXAM: PORTABLE CHEST 1 VIEW COMPARISON:  01/12/2019 FINDINGS: COPD with hyperinflation. Left upper lobe infiltrate unchanged. Left lower lobe infiltrate with progressive volume loss. No significant pleural effusion. Negative for heart failure. IMPRESSION: Left upper lobe and left lower lobe infiltrates compatible with pneumonia. Progressive volume loss in the  left lung base. No effusion. Electronically Signed   By: Marlan Palau M.D.   On: 01/14/2019 13:52   Dg Chest Port 1 View  Result Date: 01/12/2019 CLINICAL DATA:  Respiratory failure EXAM: PORTABLE CHEST 1 VIEW COMPARISON:  01/09/2019 FINDINGS: Heart is normal size. Diffuse airspace disease throughout the left lung, most confluent in the left upper lobe. This is unchanged. No confluent opacity on the right. Heart is normal size. IMPRESSION: Diffuse left lung airspace disease, unchanged since prior study. Electronically Signed   By: Charlett Nose M.D.   On: 01/12/2019 08:08   Dg Chest Portable 1 View  Result Date: 01/09/2019 CLINICAL DATA:  Hypoxia EXAM: PORTABLE CHEST 1 VIEW COMPARISON:  January 09, 2019 study obtained earlier in the day FINDINGS: Endotracheal tube tip is 4.0 cm above the carina. Nasogastric tube tip is in the stomach. Side port not well seen but probably near the gastroesophageal junction. No pneumothorax evident. : There is airspace consolidation throughout portions of the left upper and left lower lobe. There is mild right base atelectasis. The right lung is otherwise clear. Heart size and pulmonary vascularity are normal. No adenopathy. No bone lesions. IMPRESSION: Tube positions as described without pneumothorax. Extensive airspace opacity felt to represent multifocal pneumonia throughout left upper and lower lobes. Mild right base atelectasis. Heart size normal. Electronically Signed   By: Bretta Bang III M.D.   On: 01/09/2019 18:22   Dg Chest Port 1 View  Result Date: 01/09/2019 CLINICAL DATA:  Decreased level of consciousness and shortness of breath EXAM: PORTABLE CHEST 1 VIEW COMPARISON:  12/09/2018 FINDINGS: Cardiac shadows within normal limits. Lungs are hyperinflated similar to that seen on the prior exam. Patchy infiltrate is noted in the left upper lobe and to a lesser degree in the left lower lobe consistent with multifocal pneumonia. No sizable effusion is seen. No bony  abnormality is noted. IMPRESSION: Patchy multifocal pneumonia within the left lung as described. COPD. Electronically Signed   By: Alcide Clever M.D.   On: 01/09/2019 12:46    Lab Data:  CBC: Recent Labs  Lab 01/09/19 2059 01/09/19 2113 01/10/19 0617 01/12/19 0328 01/13/19 0514 01/14/19 0647  WBC 24.4*  --  21.2* 21.9* 24.5* 24.6*  NEUTROABS  --   --   --  18.2*  --   --   HGB 11.7* 11.9* 10.9* 10.6* 10.6* 10.4*  HCT 38.7* 35.0* 34.9* 32.9* 34.3* 33.9*  MCV 87.2  --  86.2 85.5 86.0 86.3  PLT 426*  --  398 386 330 299   Basic Metabolic Panel: Recent Labs  Lab 01/10/19 0617 01/10/19 0943 01/11/19 0319 01/12/19 0328 01/13/19 0514 01/14/19 0647  NA 139 141 143 141 136 135  K 3.6 3.5 3.7 2.9* 3.8 3.8  CL 95* 98 102 99 96* 94*  CO2 32  32 31 32 27 29  GLUCOSE 225* 143* 226* 174* 135* 127*  BUN CREATININE 1.02 1.02 1.02 0.85 0.90 0.90  CALCIUM 10.3 10.4* 9.7 9.4 9.3 9.4  MG 2.6*  --  2.1 2.0  --   --   PHOS 1.5*  --  2.7 2.7  --   --    GFR: Estimated Creatinine Clearance: 82.9 mL/min (by C-G formula based on SCr of 0.9 mg/dL). Liver Function Tests: No results for input(s): AST, ALT, ALKPHOS, BILITOT, PROT, ALBUMIN in the last 168 hours. No results for input(s): LIPASE, AMYLASE in the last 168 hours. No results for input(s): AMMONIA in the last 168 hours. Coagulation Profile: No results for input(s): INR, PROTIME in the last 168 hours. Cardiac Enzymes: Recent Labs  Lab 01/09/19 1206  TROPONINI 0.05*   BNP (last 3 results) No results for input(s): PROBNP in the last 8760 hours. HbA1C: No results for input(s): HGBA1C in the last 72 hours. CBG: Recent Labs  Lab 01/13/19 1200 01/13/19 1735 01/13/19 1953 01/14/19 0838 01/14/19 1203  GLUCAP 133* 212* 149* 125* 118*   Lipid Profile: No results for input(s): CHOL, HDL, LDLCALC, TRIG, CHOLHDL, LDLDIRECT in the last 72 hours. Thyroid Function Tests: No results for input(s): TSH, T4TOTAL, FREET4,  T3FREE, THYROIDAB in the last 72 hours. Anemia Panel: No results for input(s): VITAMINB12, FOLATE, FERRITIN, TIBC, IRON, RETICCTPCT in the last 72 hours. Urine analysis:    Component Value Date/Time   COLORURINE YELLOW 01/09/2019 1809   APPEARANCEUR HAZY (A) 01/09/2019 1809   LABSPEC 1.028 01/09/2019 1809   PHURINE 5.0 01/09/2019 1809   GLUCOSEU >=500 (A) 01/09/2019 1809   HGBUR NEGATIVE 01/09/2019 1809   BILIRUBINUR NEGATIVE 01/09/2019 1809   KETONESUR 80 (A) 01/09/2019 1809   PROTEINUR NEGATIVE 01/09/2019 1809   UROBILINOGEN 0.2 02/08/2008 1731   NITRITE NEGATIVE 01/09/2019 1809   LEUKOCYTESUR NEGATIVE 01/09/2019 1809     Liseth Wann M.D. [facility] 01/14/2019, 2:38 PM  Pager: [phone] Between 7am to 7pm - call Pager - [phone]  After 7pm go to [email or web address] - password [email or web address]  Call night coverage person covering after 7pm

## 2019-01-14 NOTE — Evaluation (Signed)
Physical Therapy Evaluation Patient Details Name: Jeff Ray MRN: [id number] DOB: 06/09/1956 Today's Date: 01/14/2019   History of Present Illness  Jeff NearingKarl E Wilsonis a 63 y.o.malewho [facility] with acute on chronic hypoxic and hypercapnic respiratory failure, likely due to COPD exacerbation (has had mutliple admissions for this in the past). Was deemed to be a COVID-19 rule out per ID; therefore, [facility] int[facility] (was on BiPAP prior). Extubated 01/10/19, on [place]; as of 4/12, Covid 19 not detected  Clinical Impression  Pt admitted with above diagnosis. Pt currently with functional limitations due to the deficits listed below (see PT Problem List). On supplemental O2 prior to admission, 2-3 liters, and mostly uses power chair for mobility; Presents with significantly decr functional capacity, with decr O2 sats to 80s with activity on 5 L supplemental O2; Required at least 5 minutes of rest to recover from getting up to EOB, and then to recover also from basic pivotting to the recliner;  Pt will benefit from skilled PT to increase their independence and safety with mobility to allow discharge to the venue listed below.       Follow Up Recommendations Home health PT;Supervision/Assistance - 24 hour    Equipment Recommendations  None recommended by PT    Recommendations for Other Services OT consult(Will order per protocol)     Precautions / Restrictions Precautions Precautions: Fall Precaution Comments: Desats very quickly Restrictions Weight Bearing Restrictions: No      Mobility  Bed Mobility Overal bed mobility: Needs Assistance Bed Mobility: Supine to Sit     Supine to sit: Mod assist     General bed mobility comments: light mod handheld assist to pull to sit; EXTREMELY short of breath once sitting up, needing approx 5 minutes to recover enough to talk  Transfers Overall transfer level: Needs assistance   Transfers: Stand Pivot  Transfers   Stand pivot transfers: Min guard       General transfer comment: Cues to self-monitor for activity tolerance; Closeguard for safety, but no need for assist; EXTREMELY fatigued after transfer; titrated O2 to 6L  Ambulation/Gait                Stairs            Wheelchair Mobility    Modified Rankin (Stroke Patients Only)       Balance                                             Pertinent Vitals/Pain Pain Assessment: No/denies pain    Home Living Family/patient expects to be discharged to:: Private residence Living Arrangements: Spouse/significant other(and 2 grandkids) Available Help at Discharge: Family;Available 24 hours/day Type of Home: Mobile home Home Access: Ramped entrance     Home Layout: One level Home Equipment: Wheelchair - power(Oxygen)      Prior Function Level of Independence: Independent with assistive device(s)         Comments: Very limited walking; mostly uses power chair; independent transfers to/from chair     Hand Dominance        Extremity/Trunk Assessment   Upper Extremity Assessment Upper Extremity Assessment: Overall WFL for tasks assessed    Lower Extremity Assessment Lower Extremity Assessment: Generalized weakness       Communication   Communication: Other (comment)(some DOE with talking)  Cognition Arousal/Alertness: Awake/alert  Behavior During Therapy: WFL for tasks assessed/performed Overall Cognitive Status: Within Functional Limits for tasks assessed                                        General Comments General comments (skin integrity, edema, etc.): O2 sats 86% post transfer bed to chair on 6 L    Exercises     Assessment/Plan    PT Assessment Patient needs continued PT services  PT Problem List Decreased strength;Decreased activity tolerance;Decreased balance;Decreased mobility;Decreased knowledge of use of DME;Cardiopulmonary status limiting  activity       PT Treatment Interventions DME instruction;Gait training;Functional mobility training;Therapeutic activities;Therapeutic exercise;Balance training;Patient/family education    PT Goals (Current goals can be found in the Care Plan section)  Acute Rehab PT Goals Patient Stated Goal: get home to his hobby of restoring a tractor PT Goal Formulation: With patient Time For Goal Achievement: 01/28/19 Potential to Achieve Goals: Fair    Frequency Min 3X/week   Barriers to discharge        Co-evaluation               AM-PAC PT "6 Clicks" Mobility  Outcome Measure Help needed turning from your back to your side while in a flat bed without using bedrails?: A Little Help needed moving from lying on your back to sitting on the side of a flat bed without using bedrails?: A Little Help needed moving to and from a bed to a chair (including a wheelchair)?: A Little Help needed standing up from a chair using your arms (e.g., wheelchair or bedside chair)?: A Little Help needed to walk in hospital room?: A Lot Help needed climbing 3-5 steps with a railing? : Total 6 Click Score: 15    End of Session Equipment Utilized During Treatment: Oxygen Activity Tolerance: (Limited by decr functional capacity) Patient left: in chair;with call bell/phone within reach;with chair alarm set   PT Visit Diagnosis: Other abnormalities of gait and mobility (R26.89);Other (comment)(decr functional capacity)    Time: 1429-1500 PT Time Calculation (min) (ACUTE ONLY): 31 min   Charges:   PT Evaluation $PT Eval Moderate Complexity: 1 Mod PT Treatments $Therapeutic Activity: 8-22 mins        Van Clines, PT  Acute Rehabilitation Services Pager [phone] Office [phone]   Levi Aland 01/14/2019, 3:41 PM

## 2019-01-14 NOTE — Progress Notes (Signed)
NURSING PROGRESS NOTE  Jeff Ray [id number] Transfer Data: 01/14/2019 12:56 PM Attending Provider: Cathren Harsh, MD WUJ:[id number], Brett Canales, MD Code Status: full  Jeff Ray is a 63 y.o. male patient transferred from 2W  -No acute distress noted.  -No complaints of shortness of breath.  -No complaints of chest pain.   Cardiac Monitoring: Box # 18 in place. Cardiac monitor yields:sinus tachycardia.  Blood pressure 126/67, pulse (!) 105, temperature 98.5 F (36.9 C), temperature source Oral, resp. rate (!) 28, height 5\' 9"  (1.753 m), weight 76.4 kg, SpO2 93 %.   IV Fluids:  IV in place, occlusive dsg intact without redness, IV cath forearm right, condition patent and no redness none.   Allergies:  Patient has no known allergies.  Past Medical History:   has a past medical history of Anxiety, Asthma, COPD (chronic obstructive pulmonary disease) (HCC), Diabetes mellitus, History of nuclear stress test (02/2016), Hypertension, and On home O2.  Past Surgical History:   has a past surgical history that includes Back surgery.  Social History:   reports that he quit smoking about 6 years ago. His smoking use included cigarettes. He smoked 0.00 packs per day for 10.00 years. He has never used smokeless tobacco. He reports that he does not drink alcohol or use drugs.  Skin: WNL  Patient/Family orientated to room. Information packet given to patient/family. Admission inpatient armband information verified with patient/family to include name and date of birth and placed on patient arm. Side rails up x 2, fall assessment and education completed with patient/family. Patient/family able to verbalize understanding of risk associated with falls and verbalized understanding to call for assistance before getting out of bed. Call light within reach. Patient/family able to voice and demonstrate understanding of unit orientation instructions.    Will continue to evaluate and treat per MD  orders.

## 2019-01-14 NOTE — Progress Notes (Signed)
Patient is SOB.  Patient 02 sats 85-86% on 5 LPM.  Elevated the head of bed and increased the oxygen from 5 to 6 LPM [place].  Patient lung sounds are diminished bilaterally.  Notified RT. RT recommended neb treatments to be ordered.  Notified C. Bodenheimer, NP.  Will continue to monitor the patient and notify as needed

## 2019-01-14 NOTE — Progress Notes (Signed)
Called to pt's bedside to assess for sob, increased wob, and desaturations.  Pt found on 6 lpm [place], Spo2 85-86%, HR 116, RR 24-25.  BS diminished bilaterally.  Pt placed on 10 lpm [place], prn Duoneb tx given.  After tx pt states he feels better.  HR 111, RR 20-22, BS unchanged, Spo2 94-96%.  Pt asking for anxiety medication.  RN notified.

## 2019-01-15 ENCOUNTER — Inpatient Hospital Stay (HOSPITAL_COMMUNITY): Payer: Medicare HMO

## 2019-01-15 DIAGNOSIS — J181 Lobar pneumonia, unspecified organism: Secondary | ICD-10-CM

## 2019-01-15 DIAGNOSIS — D72825 Bandemia: Secondary | ICD-10-CM

## 2019-01-15 LAB — CBC
HCT: 35.5 % — ABNORMAL LOW (ref 39.0–52.0)
Hemoglobin: 11.3 g/dL — ABNORMAL LOW (ref 13.0–17.0)
MCH: 27.7 pg (ref 26.0–34.0)
MCHC: 31.8 g/dL (ref 30.0–36.0)
MCV: 87 fL (ref 80.0–100.0)
Platelets: 268 10*3/uL (ref 150–400)
RBC: 4.08 MIL/uL — ABNORMAL LOW (ref 4.22–5.81)
RDW: 14.2 % (ref 11.5–15.5)
WBC: 26.5 10*3/uL — ABNORMAL HIGH (ref 4.0–10.5)
nRBC: 0 % (ref 0.0–0.2)

## 2019-01-15 LAB — GLUCOSE, CAPILLARY
Glucose-Capillary: 182 mg/dL — ABNORMAL HIGH (ref 70–99)
Glucose-Capillary: 194 mg/dL — ABNORMAL HIGH (ref 70–99)
Glucose-Capillary: 193 mg/dL — ABNORMAL HIGH (ref 70–99)
Glucose-Capillary: 163 mg/dL — ABNORMAL HIGH (ref 70–99)

## 2019-01-15 LAB — PROCALCITONIN: Procalcitonin: 0.57 ng/mL

## 2019-01-15 MED ORDER — IPRATROPIUM-ALBUTEROL 0.5-2.5 (3) MG/3ML IN SOLN
3.0000 mL | RESPIRATORY_TRACT | Status: DC
  Administered 2019-01-15 – 2019-01-16 (×7): 3 mL via RESPIRATORY_TRACT
  Filled 2019-01-15 (×7): qty 3

## 2019-01-15 MED ORDER — AZITHROMYCIN 250 MG PO TABS
250.0000 mg | ORAL_TABLET | Freq: Every day | ORAL | Status: DC
  Administered 2019-01-16: 250 mg via ORAL
  Filled 2019-01-15: qty 1

## 2019-01-15 MED ORDER — METHYLPREDNISOLONE SODIUM SUCC 125 MG IJ SOLR
80.0000 mg | Freq: Two times a day (BID) | INTRAMUSCULAR | Status: DC
  Administered 2019-01-15 – 2019-01-17 (×4): 80 mg via INTRAVENOUS
  Filled 2019-01-15 (×4): qty 2

## 2019-01-15 MED ORDER — ALPRAZOLAM 0.5 MG PO TABS
0.5000 mg | ORAL_TABLET | Freq: Three times a day (TID) | ORAL | Status: DC
  Administered 2019-01-15 – 2019-01-19 (×13): 0.5 mg via ORAL
  Filled 2019-01-15 (×13): qty 1

## 2019-01-15 MED ORDER — AZITHROMYCIN 500 MG PO TABS
500.0000 mg | ORAL_TABLET | Freq: Every day | ORAL | Status: AC
  Administered 2019-01-15: 500 mg via ORAL
  Filled 2019-01-15: qty 1

## 2019-01-15 MED ORDER — IPRATROPIUM-ALBUTEROL 0.5-2.5 (3) MG/3ML IN SOLN
3.0000 mL | Freq: Once | RESPIRATORY_TRACT | Status: AC
  Administered 2019-01-15: 3 mL via RESPIRATORY_TRACT
  Filled 2019-01-15: qty 3

## 2019-01-15 NOTE — Progress Notes (Signed)
Pharmacy Antibiotic Note  Jeff Ray is a 63 y.o. male admitted on 01/09/2019 with pneumonia.  Pharmacy has been consulted for cefepime dosing.  Plan: Discontinue Cefepime 2gm IV q12h after tonight's dose (day 7)   Height: 5\' 9"  (175.3 cm) Weight: 172 lb 9.9 oz (78.3 kg) IBW/kg (Calculated) : 70.7  Temp (24hrs), Avg:98.9 F (37.2 C), Min:98.3 F (36.8 C), Max:99.3 F (37.4 C)  Recent Labs  Lab 01/09/19 1300 01/09/19 1720  01/10/19 0617 01/10/19 0943 01/11/19 0319 01/12/19 0328 01/13/19 0514 01/14/19 0647 01/15/19 0428  WBC  --   --    < > 21.2*  --   --  21.9* 24.5* 24.6* 26.5*  CREATININE  --   --    < > 1.02 1.02 1.02 0.85 0.90 0.90  --   LATICACIDVEN 2.5* 1.7  --   --   --   --   --   --   --   --    < > = values in this interval not displayed.    Estimated Creatinine Clearance: 82.9 mL/min (by C-G formula based on SCr of 0.9 mg/dL).    No Known Allergies  Antimicrobials this admission: 4/7 azithromycin x 1 4/7 ceftriaxone x 1 4/7cefepime >> 4/13 4/7 vancomycin >>4/8  Microbiology results: 4/7 BCx: NGTD 4/7 COVID:IP  4/7 resp PCR: neg  - rhinovirus NOT detected this admit - detected 4 months ago  4/7 flu: neg  4/8 MRSA: neg  4/8 strep: neg  4/8 leg: IP  4/8 Sputum cx: no orgs seen  Luria Rosario A. Jeanella Craze, PharmD, BCPS Clinical Pharmacist [place] Please utilize Amion for appropriate phone number to reach the unit pharmacist [facility])    01/15/2019 11:36 AM

## 2019-01-15 NOTE — Evaluation (Signed)
Occupational Therapy Evaluation Patient Details Name: Jeff Ray MRN: [id number] DOB: 1956-06-12 Today's Date: 01/15/2019    History of Present Illness Jeff Yokoyama Wilsonis a 63 y.o.malewho was admitted to [facility] with acute on chronic hypoxic and hypercapnic respiratory failure, likely due to COPD exacerbation (has had mutliple admissions for this in the past). Was deemed to be a COVID-19 rule out per ID; therefore, required intubation prior to transfer to [facility] (was on BiPAP prior). Extubated 01/10/19, on [place]; as of 4/12, Covid 19 not detected   Clinical Impression   Pt typically uses a power wheelchair as his primary means of mobility, but can walk with a cane short distances. Pt sits on his BSC at sink and his wife bathes him. He can dress and toilet independently. His family assists with IADL. Pt presents with poor activity tolerance and currently requires 10L 02 to maintain Sp02 88-93%. He declined OOB to chair. Will follow acutely.    Follow Up Recommendations  Home health OT    Equipment Recommendations  None recommended by OT    Recommendations for Other Services       Precautions / Restrictions Precautions Precautions: Fall Precaution Comments: Desats very quickly Restrictions Weight Bearing Restrictions: No      Mobility Bed Mobility Overal bed mobility: Needs Assistance Bed Mobility: Supine to Sit;Sit to Supine     Supine to sit: Mod assist Sit to supine: Mod assist   General bed mobility comments: pulled up on therapist's hand to raise trunk, assisted LEs back into bed  Transfers                 General transfer comment: deferred    Balance Overall balance assessment: Needs assistance   Sitting balance-Leahy Scale: Good                                     ADL either performed or assessed with clinical judgement   ADL Overall ADL's : Needs assistance/impaired Eating/Feeding: Independent;Sitting   Grooming: Wash/dry hands;Sitting;Set  up   Upper Body Bathing: Minimal assistance;Sitting   Lower Body Bathing: Maximal assistance;Sitting/lateral leans   Upper Body Dressing : Set up;Sitting   Lower Body Dressing: Maximal assistance;Sitting/lateral leans                 General ADL Comments: pt declined activity beyond sitting EOB due to fatigue/dyspnea, pt currently on 10L 02     Vision Baseline Vision/History: Wears glasses Wears Glasses: Reading only Patient Visual Report: No change from baseline       Perception     Praxis      Pertinent Vitals/Pain Pain Assessment: No/denies pain     Hand Dominance Right   Extremity/Trunk Assessment Upper Extremity Assessment Upper Extremity Assessment: Overall WFL for tasks assessed   Lower Extremity Assessment Lower Extremity Assessment: Defer to PT evaluation       Communication Communication Communication: No difficulties   Cognition Arousal/Alertness: Awake/alert Behavior During Therapy: WFL for tasks assessed/performed Overall Cognitive Status: Within Functional Limits for tasks assessed                                     General Comments       Exercises     Shoulder Instructions      Home Living Family/patient expects to be discharged to:: Private residence  Living Arrangements: Spouse/significant other(2 young grandchildren) Available Help at Discharge: Family;Available 24 hours/day Type of Home: Mobile home Home Access: Ramped entrance     Home Layout: One level     Bathroom Shower/Tub: Chief Strategy OfficerTub/shower unit   Bathroom Toilet: Standard Bathroom Accessibility: No   Home Equipment: Wheelchair - power;Bedside commode;Shower seat;Cane - single point          Prior Functioning/Environment Level of Independence: Needs assistance  Gait / Transfers Assistance Needed: primarily uses his power w/c, walks short distances with a cane to rooms in which his power chair does not fit ADL's / Homemaking Assistance Needed: wife  assists him with sponge bathing and dressing and all IADL   Comments: pt typically uses 2-3 L 02        OT Problem List: Decreased activity tolerance;Cardiopulmonary status limiting activity      OT Treatment/Interventions: Self-care/ADL training;Energy conservation;DME and/or AE instruction;Patient/family education    OT Goals(Current goals can be found in the care plan section) Acute Rehab OT Goals Patient Stated Goal: get home to his hobby of restoring a tractor OT Goal Formulation: With patient Time For Goal Achievement: 01/29/19 Potential to Achieve Goals: Good ADL Goals Pt Will Perform Grooming: with set-up;sitting(at sink on BSC) Pt Will Perform Upper Body Dressing: with set-up;sitting Pt Will Perform Lower Body Dressing: with supervision;sit to/from stand Pt Will Transfer to Toilet: with supervision;ambulating(with cane) Pt Will Perform Toileting - Clothing Manipulation and hygiene: with supervision;sit to/from stand Additional ADL Goal #1: Pt will utilize energy conservation strategies and breathing techniques during ADL and mobility.  OT Frequency: Min 2X/week   Barriers to D/C:            Co-evaluation              AM-PAC OT "6 Clicks" Daily Activity     Outcome Measure Help from another person eating meals?: None Help from another person taking care of personal grooming?: A Little Help from another person toileting, which includes using toliet, bedpan, or urinal?: A Little Help from another person bathing (including washing, rinsing, drying)?: A Lot Help from another person to put on and taking off regular upper body clothing?: A Little Help from another person to put on and taking off regular lower body clothing?: A Lot 6 Click Score: 17   End of Session    Activity Tolerance: Patient limited by fatigue Patient left: in bed;with call bell/phone within reach;Other (comment)(with RT)  OT Visit Diagnosis: Other (comment)(decreased activity tolerance)                 Time: [id number] OT Time Calculation (min): 37 min Charges:  OT General Charges $OT Visit: 1 Visit OT Evaluation $OT Eval Moderate Complexity: 1 Mod OT Treatments $Self Care/Home Management : 8-22 mins  Martie RoundJulie Ileah Falkenstein, OTR/L Acute Rehabilitation Services Pager: [phone] Office: [phone]  Evern BioMayberry, Ketzaly Cardella Lynn 01/15/2019, 12:38 PM

## 2019-01-15 NOTE — Progress Notes (Signed)
PT Cancellation Note  Patient Details Name: Jeff Ray MRN: [id number] DOB: Nov 08, 1955   Cancelled Treatment:    Reason Eval/Treat Not Completed: Medical issues which prohibited therapy. Pt reports difficulty breathing. Notified respiratory who was on floor.   Angelina Ok [facility] 01/15/2019, 2:30 PM Skip Mayer PT Acute Rehabilitation Services Pager [phone] Office [phone]

## 2019-01-15 NOTE — Progress Notes (Signed)
PROGRESS NOTE  Jeff Ray AVW:[id number] DOB: 08/12/1956 DOA: 01/09/2019 PCP: Jeff MorganKnowlton, [name], MD   LOS: 6 days   Brief Narrative / Interim history: Jeff NearingKarl E Wilsonis a 63 y.o.malewho was admitted to [facility] with acute on chronic hypoxic and hypercapnic respiratory failure, likely due to COPD exacerbation (has had mutliple admissions for this in the past). Was deemed to be a COVID-19 rule out per ID; therefore, required intubation [facility] (was on BiPAP prior). Patient was extubated on 01/10/2019, TRH resumed care on 01/12/2019  COVID-19 results NEGATIVE   Subjective: Continues to endorse shortness of breath despite breathing treatment.  Desaturated to mid 80s on 8 L by high flow nasal cannula overnight.  Put on BiPAP.  Off BiPAP to high flow nasal cannula this morning.  Denies chest pain, nausea, vomiting or abdominal pain.  Assessment & Plan: Principal Problem:   Acute on chronic respiratory failure with hypercapnia (HCC) Active Problems:   COPD exacerbation (HCC)   HLD (hyperlipidemia)   BPH (benign prostatic hyperplasia)   Uncontrolled type 2 diabetes mellitus with hyperglycemia (HCC)   Pneumonia   Acute metabolic encephalopathy   Suspected Covid-19 Virus Infection   Diabetic ketoacidosis with coma associated with type 2 diabetes mellitus (HCC)  Acute on chronic respiratory failure with hypoxia and hypercapnia secondary to COPD exacerbation and multifocal pneumonia.  Chest x-Ray with LUL and LLL infiltrate.  Initial ABG with acute respiratory acidosis. -Intubated in ED out of concern for aerosolization for COVID-19 which returned negative later.   -Extubated to 5 L on 01/10/2019.  TRH assumed care on 4/10. -Influenza panel, RVP, strep antigen, urine Legionella, blood culture and COVID-19 negative. -Treated with brief prednisone taper, Breo, Incruse and DuoNeb. -We will start IV Solu-Medrol 80 mg twice daily and taper as appropriate -Completed 7 days of IV cefepime on  4/13 for multifocal pneumonia.   -Received 2 days of azithromycin 500 mg. May need more atypical coverage but will decide based on PCT -Repeat CXR on 4/12 with LUL and LLL infiltrates compatible with pneumonia.  This is similar to his CXR on admission. -Check procalcitonin-will decide antibiotic course based on this. -Wean oxygen as able.  Goal to maintain saturation between 88% and 92%. -Incentive spirometry and flutter valve  Leukocytosis: Likely due to steroid.  But cannot exclude infectious process -Check procalcitonin as above -Continue trending  Anxiety: On Xanax 1 mg 3 times daily at home. -Resume 0.5 mg 3 times daily  DKA/NIDDM-2: CBG fairly controlled.  DKA resolved. -Check A1c -CBG monitoring -Continue Lantus, mealtime and SSI  Acute metabolic encephalopathy: Resolved.  Likely due to the above.  Hypokalemia: Resolved.  Hyperlipidemia/GERD: Stable. -Continue home medications.   Scheduled Meds: . ALPRAZolam  0.5 mg Oral TID  . enoxaparin (LOVENOX) injection  40 mg Subcutaneous Q24H  . fluticasone  2 spray Each Nare Daily  . fluticasone furoate-vilanterol  1 puff Inhalation Daily  . insulin aspart  0-15 Units Subcutaneous TID WC  . insulin aspart  0-5 Units Subcutaneous QHS  . insulin aspart  3 Units Subcutaneous TID WC  . insulin glargine  15 Units Subcutaneous Daily  . ipratropium-albuterol  3 mL Nebulization Q4H WA  . loratadine  10 mg Oral Daily  . mouth rinse  15 mL Mouth Rinse q12n4p  . montelukast  10 mg Oral QHS  . oxybutynin  5 mg Oral TID  . pantoprazole  40 mg Oral Daily  . rosuvastatin  40 mg Oral QHS  . umeclidinium bromide  1 puff Inhalation Daily   Continuous Infusions: . sodium chloride 10 mL/hr at 01/11/19 1100  . ceFEPime (MAXIPIME) IV 2 g (01/15/19 0927)   PRN Meds:.sodium chloride, ipratropium-albuterol  DVT prophylaxis: Subcu Lovenox Code Status: Full code Family Communication: None at bedside Disposition Plan: Remains inpatient   Consultants:   None  Procedures:   ETT 4/7-4/8  Antimicrobials:  Cefepime 4/7-4/30  Azithromycin 4/7-4/8  Ceftriaxone 4/7  Vancomycin 4/7-4/8  Objective: Vitals:   01/15/19 0801 01/15/19 0900 01/15/19 1027 01/15/19 1112  BP:   (!) 122/58 123/71  Pulse: (!) 109 (!) 107 (!) 109   Resp: (!) 35 (!) 24 (!) 21   Temp: 98.9 F (37.2 C)     TempSrc: Oral     SpO2: 96% 98% 94%   Weight:      Height:        Intake/Output Summary (Last 24 hours) at 01/15/2019 1312 Last data filed at 01/15/2019 0534 Gross per 24 hour  Intake -  Output 750 ml  Net -750 ml   Filed Weights   01/10/19 0500 01/11/19 0500 01/15/19 0500  Weight: 77.4 kg 76.4 kg 78.3 kg    Examination:  GENERAL: Appears well. No acute distress.  EYES - vision grossly intact. Sclera anicteric.  NOSE- no gross deformity or drainage MOUTH - no oral lesions noted THROAT- no swelling or erythema LUNGS: On 10 L by HF [place].  Some increased work of breathing.  Poor aeration bilaterally. HEART: RRR. Heart sounds normal. ABD: Bowel sounds present. Soft. Non tender.  MSK/EXT: Moves extremities. No obvious deformity. SKIN: no apparent skin lesion.  NEURO: Awake, alert and oriented appropriately.  No gross deficit.  PSYCH: Calm. Normal affect.    Data Reviewed: I have independently reviewed following labs and imaging studies   CBC: Recent Labs  Lab 01/10/19 0617 01/12/19 0328 01/13/19 0514 01/14/19 0647 01/15/19 0428  WBC 21.2* 21.9* 24.5* 24.6* 26.5*  NEUTROABS  --  18.2*  --   --   --   HGB 10.9* 10.6* 10.6* 10.4* 11.3*  HCT 34.9* 32.9* 34.3* 33.9* 35.5*  MCV 86.2 85.5 86.0 86.3 87.0  PLT 398 386 330 299 268   Basic Metabolic Panel: Recent Labs  Lab 01/10/19 0617 01/10/19 0943 01/11/19 0319 01/12/19 0328 01/13/19 0514 01/14/19 0647  NA 139 141 143 141 136 135  K 3.6 3.5 3.7 2.9* 3.8 3.8  CL 95* 98 102 99 96* 94*  CO2 32 32 31 32 27 29  GLUCOSE 225* 143* 226* 174* 135* 127*  BUN CREATININE 1.02 1.02 1.02 0.85 0.90 0.90  CALCIUM 10.3 10.4* 9.7 9.4 9.3 9.4  MG 2.6*  --  2.1 2.0  --   --   PHOS 1.5*  --  2.7 2.7  --   --    GFR: Estimated Creatinine Clearance: 82.9 mL/min (by C-G formula based on SCr of 0.9 mg/dL). Liver Function Tests: No results for input(s): AST, ALT, ALKPHOS, BILITOT, PROT, ALBUMIN in the last 168 hours. No results for input(s): LIPASE, AMYLASE in the last 168 hours. No results for input(s): AMMONIA in the last 168 hours. Coagulation Profile: No results for input(s): INR, PROTIME in the last 168 hours. Cardiac Enzymes: Recent Labs  Lab 01/09/19 1206  TROPONINI 0.05*   BNP (last 3 results) No results for input(s): PROBNP in the last 8760 hours. HbA1C: No results for input(s): HGBA1C in the last 72 hours. CBG: Recent Labs  Lab 01/14/19  1713 01/14/19 2129 01/14/19 2219 01/15/19 0803 01/15/19 1154  GLUCAP 123* 67* 137* 182* 194*   Lipid Profile: No results for input(s): CHOL, HDL, LDLCALC, TRIG, CHOLHDL, LDLDIRECT in the last 72 hours. Thyroid Function Tests: No results for input(s): TSH, T4TOTAL, FREET4, T3FREE, THYROIDAB in the last 72 hours. Anemia Panel: No results for input(s): VITAMINB12, FOLATE, FERRITIN, TIBC, IRON, RETICCTPCT in the last 72 hours. Urine analysis:    Component Value Date/Time   COLORURINE YELLOW 01/09/2019 1809   APPEARANCEUR HAZY (A) 01/09/2019 1809   LABSPEC 1.028 01/09/2019 1809   PHURINE 5.0 01/09/2019 1809   GLUCOSEU >=500 (A) 01/09/2019 1809   HGBUR NEGATIVE 01/09/2019 1809   BILIRUBINUR NEGATIVE 01/09/2019 1809   KETONESUR 80 (A) 01/09/2019 1809   PROTEINUR NEGATIVE 01/09/2019 1809   UROBILINOGEN 0.2 02/08/2008 1731   NITRITE NEGATIVE 01/09/2019 1809   LEUKOCYTESUR NEGATIVE 01/09/2019 1809   Sepsis Labs: Invalid input(s): PROCALCITONIN, LACTICIDVEN  Recent Results (from the past 240 hour(s))  Blood culture (routine x 2)     Status: Abnormal   Collection Time: 01/09/19 12:06 PM   Result Value Ref Range Status   Specimen Description   Final    BLOOD RIGHT ARM UPPER Performed at [facility], [address] [place], [place] [address]    Special Requests   Final    BOTTLES DRAWN AEROBIC AND ANAEROBIC Blood Culture adequate volume Performed at [facility], [address], [place], [place] [address]    Culture  Setup Time   Final    GRAM POSITIVE RODS AEROBIC BOTTLE ONLY Gram Stain Report Called to,Read Back By and Verified With: Downard C. AT [facility] AT 1020A ON [id number] BY THOMPSON S.    Culture (A)  Final    DIPHTHEROIDS(CORYNEBACTERIUM SPECIES) Standardized susceptibility testing for this organism is not available. Performed at [facility], [address], [place], [place] [address]    Report Status 01/13/2019 FINAL  Final  Culture, blood (Routine X 2) w Reflex to ID Panel     Status: None   Collection Time: 01/09/19  2:15 PM  Result Value Ref Range Status   Specimen Description BLOOD RIGHT BRACHIAL  Final   Special Requests AEROBIC BOTTLE ONLY Blood Culture adequate volume  Final   Culture   Final    NO GROWTH 5 DAYS Performed at [facility], [address] [place], [place] [address]    Report Status 01/14/2019 FINAL  Final  Respiratory Panel by PCR     Status: None   Collection Time: 01/09/19  3:58 PM  Result Value Ref Range Status   Adenovirus NOT DETECTED NOT DETECTED Final   Coronavirus 229E NOT DETECTED NOT DETECTED Final    Comment: (NOTE) The Coronavirus on the Respiratory Panel, DOES NOT test for the novel  Coronavirus (2019 nCoV)    Coronavirus HKU1 NOT DETECTED NOT DETECTED Final   Coronavirus NL63 NOT DETECTED NOT DETECTED Final   Coronavirus OC43 NOT DETECTED NOT DETECTED Final   Metapneumovirus NOT DETECTED NOT DETECTED Final   Rhinovirus / Enterovirus NOT DETECTED NOT DETECTED Final   Influenza A NOT DETECTED NOT DETECTED Final   Influenza B NOT DETECTED NOT DETECTED Final   Parainfluenza Virus 1 NOT DETECTED NOT DETECTED  Final   Parainfluenza Virus 2 NOT DETECTED NOT DETECTED Final   Parainfluenza Virus 3 NOT DETECTED NOT DETECTED Final   Parainfluenza Virus 4 NOT DETECTED NOT DETECTED Final   Respiratory Syncytial Virus NOT DETECTED NOT DETECTED Final  Bordetella pertussis NOT DETECTED NOT DETECTED Final   Chlamydophila pneumoniae NOT DETECTED NOT DETECTED Final   Mycoplasma pneumoniae NOT DETECTED NOT DETECTED Final    Comment: Performed at [facility], [address], [place], [place] [address]  Novel Coronavirus, NAA (hospital order; send-out to ref lab)     Status: None   Collection Time: 01/09/19  4:19 PM  Result Value Ref Range Status   SARS-CoV-2, NAA NOT DETECTED NOT DETECTED Final    Comment: Negative (Not Detected) results do not exclude infection caused by SARS CoV 2 and should not be used as the sole basis for treatment or other patient management decisions. Optimum specimen types and timing for peak viral levels during infections caused  by SARS CoV 2 have not been determined. Collection of multiple specimens (types and time points) from the same patient may be necessary to detect the virus. Improper specimen collection and handling, sequence variability underlying assay primers and or probes, or the presence of organisms in  quantities less than the limit of detection of the assay may lead to false negative results. Positive and negative predictive values of testing are highly dependent on prevalence. False negative results are more likely when prevalence of disease is high. (NOTE) The expected result is Negative (Not Detected). The SARS CoV 2 test is intended for the presumptive qualitative  detection of nucleic acid from SARS CoV 2 in upper and lower  respir atory specimens. Testing methodology is real time RT PCR. Test results must be correlated with clinical presentation and  evaluated in the context of other laboratory and epidemiologic data.  Test performance can be affected  because the epidemiology and  clinical spectrum of infection caused by SARS CoV 2 is not fully  known. For example, the optimum types of specimens to collect and  when during the course of infection these specimens are most likely  to contain detectable viral RNA may not be known. This test has not been Food and Drug Administration (FDA) cleared or  approved and has been authorized by FDA under an Emergency Use  Authorization (EUA). The test is only authorized for the duration of  the declaration that circumstances exist justifying the authorization  of emergency use of in vitro diagnostic tests for detection and or  diagnosis of SARS CoV 2 under Section 564(b)(1) of the Act, 21 U.S.C.  section 3342316300 3(b)(1), unless the authorization is terminated or   revoked sooner. [facility] is certified under the  Clinical Laboratory Improvement Amendments of 1988 (CLIA), 42 U.S.C.  section [phone], to perform high complexity tests. Performed at [facility] CLIA [id number] [address], [place], [place] [address] Laboratory Director: Turner Daniels, MD Fact Sheet for Healthcare Providers  https://pope.com/ Fact Sheet for Patients  [email or web address] Performed at [facility], [address], [place], [place] [address]    Coronavirus Source NASOPHARYNGEAL  Final    Comment: Performed at [facility], [address], [place], [place] [address]  MRSA PCR Screening     Status: None   Collection Time: 01/10/19  2:11 AM  Result Value Ref Range Status   MRSA by PCR NEGATIVE NEGATIVE Final    Comment:        The GeneXpert MRSA Assay (FDA approved for NASAL specimens only), is one component of a comprehensive MRSA colonization surveillance program. It is not intended to diagnose MRSA infection nor to guide or monitor treatment for MRSA infections.  Performed at [facility], [address] [place], [place] [address]   Culture, respiratory (non-expectorated)     Status: None   Collection Time: 01/10/19  8:49 AM  Result Value Ref Range Status   Specimen Description TRACHEAL ASPIRATE  Final   Special Requests NONE  Final   Gram Stain   Final    RARE WBC PRESENT,BOTH PMN AND MONONUCLEAR NO ORGANISMS SEEN    Culture   Final    RARE Consistent with normal respiratory flora. Performed at [facility], [address], [place], [place] [address]    Report Status 01/12/2019 FINAL  Final      Radiology Studies: Dg Chest Port 1 View  Result Date: 01/15/2019 CLINICAL DATA:  Shortness of breath EXAM: PORTABLE CHEST 1 VIEW COMPARISON:  01/14/2019 FINDINGS: Cardiac shadows within normal limits. The right lung is again clear. Left lung again demonstrates persistent infiltrates in the left upper lobe and left base stable from the previous exam. No new focal abnormality is noted. IMPRESSION: Stable left-sided infiltrates when compare with the previous exam. Electronically Signed   By: Alcide Clever M.D.   On: 01/15/2019 03:12   Conal Shetley T. [facility] Pager [phone]  If 7PM-7AM, please contact night-coverage [email or web address] Password [email or web address] 01/15/2019, 1:12 PM

## 2019-01-15 NOTE — Progress Notes (Signed)
RT Note:   Walked into patient room, RN was present and explained that patient looked like he had increased WOB to her.  Patient is also tachypneic and complain of SOB.  Patient still had Bipap in room and was placed on it this morning at 3:00 am.  Feel at this time that patient would benefit from being placed back on Bipap.  RN has gone to get medications so he can go back on.  Will continue to monitor.

## 2019-01-15 NOTE — Care Management Important Message (Signed)
Important Message  Patient Details  Name: Jeff Ray MRN: [id number] Date of Birth: [birth year]   Medicare Important Message Given:  Yes    Dorena Bodo 01/15/2019, 4:23 PM

## 2019-01-15 NOTE — Progress Notes (Signed)
Patient is still experiencing SOB despite the duonebs being administered.  Patient is currently on 8 LPM HFLNC and 02 sat's  86-87%.  Patient lung sounds diminished bilaterally.   Patient breathing is very labored. Notified C. Bodenheimer, NP.  Will continue to monitor the patient and notify as needed.

## 2019-01-16 ENCOUNTER — Inpatient Hospital Stay (HOSPITAL_COMMUNITY): Payer: Medicare HMO

## 2019-01-16 LAB — CBC
HCT: 34.9 % — ABNORMAL LOW (ref 39.0–52.0)
Hemoglobin: 10.8 g/dL — ABNORMAL LOW (ref 13.0–17.0)
MCH: 27.1 pg (ref 26.0–34.0)
MCHC: 30.9 g/dL (ref 30.0–36.0)
MCV: 87.7 fL (ref 80.0–100.0)
Platelets: 255 10*3/uL (ref 150–400)
RBC: 3.98 MIL/uL — ABNORMAL LOW (ref 4.22–5.81)
RDW: 14.1 % (ref 11.5–15.5)
WBC: 29.4 10*3/uL — ABNORMAL HIGH (ref 4.0–10.5)
nRBC: 0 % (ref 0.0–0.2)

## 2019-01-16 LAB — MAGNESIUM: Magnesium: 2.3 mg/dL (ref 1.7–2.4)

## 2019-01-16 LAB — BASIC METABOLIC PANEL
Anion gap: 13 (ref 5–15)
BUN: 15 mg/dL (ref 8–23)
CO2: 27 mmol/L (ref 22–32)
Calcium: 9.6 mg/dL (ref 8.9–10.3)
Chloride: 97 mmol/L — ABNORMAL LOW (ref 98–111)
Creatinine, Ser: 1.02 mg/dL (ref 0.61–1.24)
GFR calc Af Amer: >60 mL/min (ref >60)
GFR calc non Af Amer: >60 mL/min (ref >60)
Glucose, Bld: 257 mg/dL — ABNORMAL HIGH (ref 70–99)
Potassium: 5.2 mmol/L — ABNORMAL HIGH (ref 3.5–5.1)
Sodium: 137 mmol/L (ref 135–145)

## 2019-01-16 LAB — GLUCOSE, CAPILLARY
Glucose-Capillary: 249 mg/dL — ABNORMAL HIGH (ref 70–99)
Glucose-Capillary: 278 mg/dL — ABNORMAL HIGH (ref 70–99)
Glucose-Capillary: 260 mg/dL — ABNORMAL HIGH (ref 70–99)
Glucose-Capillary: 170 mg/dL — ABNORMAL HIGH (ref 70–99)

## 2019-01-16 LAB — SARS CORONAVIRUS 2 BY RT PCR (HOSPITAL ORDER, PERFORMED IN ~~LOC~~ HOSPITAL LAB): SARS Coronavirus 2: NEGATIVE

## 2019-01-16 LAB — PROCALCITONIN: Procalcitonin: 0.44 ng/mL

## 2019-01-16 MED ORDER — BUPIVACAINE HCL (PF) 0.25 % IJ SOLN
INTRAMUSCULAR | Status: AC
  Filled 2019-01-16: qty 30

## 2019-01-16 MED ORDER — IPRATROPIUM-ALBUTEROL 0.5-2.5 (3) MG/3ML IN SOLN: 3.0000 mL | Freq: Four times a day (QID) | RESPIRATORY_TRACT | Status: DC

## 2019-01-16 MED ORDER — POVIDONE-IODINE 10 % EX OINT
TOPICAL_OINTMENT | CUTANEOUS | Status: AC
  Filled 2019-01-16: qty 28.35

## 2019-01-16 MED ORDER — BUPIVACAINE-EPINEPHRINE (PF) 0.25% -1:200000 IJ SOLN
INTRAMUSCULAR | Status: AC
  Filled 2019-01-16: qty 30

## 2019-01-16 MED ORDER — INSULIN GLARGINE 100 UNIT/ML ~~LOC~~ SOLN
25.0000 [IU] | Freq: Every day | SUBCUTANEOUS | Status: DC
  Administered 2019-01-17 – 2019-01-18 (×2): 25 [IU] via SUBCUTANEOUS
  Filled 2019-01-16 (×3): qty 0.25

## 2019-01-16 NOTE — Progress Notes (Signed)
RT Note:  RN called and patient wants to come off Bipap for awhile and be placed back on HFNC. I told the RN to go ahead and place machine on standby and place patient back on HFNC.  Will continue to monitor.

## 2019-01-16 NOTE — Progress Notes (Signed)
Physical Therapy Treatment Patient Details Name: Jeff Ray MRN: [id number] DOB: 12-20-1955 Today's Date: 01/16/2019    History of Present Illness Jeff Menzel Wilsonis a 63 y.o.malewho was admitted to [facility] with acute on chronic hypoxic and hypercapnic respiratory failure, likely due to COPD exacerbation (has had mutliple admissions for this in the past). Was deemed to be a COVID-19 rule out per ID; therefore, required intubation prior to transfer to [facility] (was on BiPAP prior). Extubated 01/10/19, on [place]; as of 4/12, Covid 19 not detected    PT Comments    Pt able to amb 8' with RW around bed to chair with significant increased time and freq rest breaks. Pt SpO2 dec into 80s on 10Lo2 via [place]. Pt remains very deconditioned and would cont to benefit from HHPT upon d/c. Acute PT to cont to follow.    Follow Up Recommendations  Home health PT;Supervision/Assistance - 24 hour     Equipment Recommendations  None recommended by PT    Recommendations for Other Services OT consult     Precautions / Restrictions Precautions Precautions: Fall Precaution Comments: desats very quickly even on 10Lo2 via [place] Restrictions Weight Bearing Restrictions: No    Mobility  Bed Mobility Overal bed mobility: Needs Assistance Bed Mobility: Supine to Sit     Supine to sit: Modified independent (Device/Increase time)     General bed mobility comments: HOB elevated, increased time and required rest once EOB  Transfers Overall transfer level: Needs assistance Equipment used: None Transfers: Sit to/from Stand Sit to Stand: Min guard         General transfer comment: pt pushed up from bed and didn't require physical assist but again required standing rest break once up with hands on RW  Ambulation/Gait Ambulation/Gait assistance: Min assist Gait Distance (Feet): 8 Feet(from one side of the bed to the other) Assistive device: Rolling walker (2 wheeled) Gait Pattern/deviations: Step-to pattern;Decreased  stride length;Trunk flexed Gait velocity: slow Gait velocity interpretation: <1.8 ft/sec, indicate of risk for recurrent falls General Gait Details: pt on 10L02, SpO2 dec to 84% during amb, pt required increased time and rest break s/p every 2 steps, minA for walker management. SPO2 inc to >90% s/p 1 min seated rest break   Stairs             Wheelchair Mobility    Modified Rankin (Stroke Patients Only)       Balance Overall balance assessment: Needs assistance   Sitting balance-Leahy Scale: Good     Standing balance support: Bilateral upper extremity supported Standing balance-Leahy Scale: Poor Standing balance comment: reliant on RW for energy conservation                            Cognition Arousal/Alertness: Awake/alert Behavior During Therapy: WFL for tasks assessed/performed Overall Cognitive Status: Within Functional Limits for tasks assessed                                 General Comments: tangental speech      Exercises      General Comments General comments (skin integrity, edema, etc.): SpO2 dropping into 80s during amb      Pertinent Vitals/Pain Pain Assessment: No/denies pain    Home Living                      Prior Function  PT Goals (current goals can now be found in the care plan section) Acute Rehab PT Goals Patient Stated Goal: go home Progress towards PT goals: Progressing toward goals    Frequency    Min 3X/week      PT Plan Current plan remains appropriate    Co-evaluation              AM-PAC PT "6 Clicks" Mobility   Outcome Measure  Help needed turning from your back to your side while in a flat bed without using bedrails?: A Little Help needed moving from lying on your back to sitting on the side of a flat bed without using bedrails?: A Little Help needed moving to and from a bed to a chair (including a wheelchair)?: A Little Help needed standing up from a chair  using your arms (e.g., wheelchair or bedside chair)?: A Little Help needed to walk in hospital room?: A Lot Help needed climbing 3-5 steps with a railing? : Total 6 Click Score: 15    End of Session Equipment Utilized During Treatment: Oxygen Activity Tolerance: Patient tolerated treatment well Patient left: in chair;with call bell/phone within reach;with chair alarm set Nurse Communication: Mobility status PT Visit Diagnosis: Other abnormalities of gait and mobility (R26.89);Other (comment)     Time: 8295-6213: 0744-0818 PT Time Calculation (min) (ACUTE ONLY): 34 min  Charges:  $Gait Training: 8-22 mins $Therapeutic Activity: 8-22 mins                     Jeff Ray, PT, DPT Acute Rehabilitation Services Pager #: [phone] Office #: [phone]    Jeff Ray 01/16/2019, 10:16 AM

## 2019-01-16 NOTE — Progress Notes (Signed)
Inpatient Diabetes Program Recommendations  AACE/ADA: New Consensus Statement on Inpatient Glycemic Control (2015)  Target Ranges:  Prepandial:   less than 140 mg/dL      Peak postprandial:   less than 180 mg/dL (1-2 hours)      Critically ill patients:  140 - 180 mg/dL   Lab Results  Component Value Date   GLUCAP 249 (H) 01/16/2019   HGBA1C 10.0 (H) 12/09/2018    Review of Glycemic Control  Diabetes history: DM 2 Outpatient Diabetes medications: Glimepiride 2 mg Daily, Metformin 500 mg BID Current orders for Inpatient glycemic control: Lantus 15 units, Novolog 0-15 units tid, Novolog 0-5 units qhs, Novolog 3 units tid meal coverage  Inpatient Diabetes Program Recommendations:    IV Solumedrol 80 mg Q12 started last night. Fasting glucose 249. May need to increase basal insulin while on high dose steroids. Lantus 20 units. May also need more meal coverage if postprandials increase. Basal insulin dose will need titration when steroids are decreased.  Thanks,  Christena Deem RN, MSN, BC-ADM Inpatient Diabetes Coordinator Team Pager [phone] (8a-5p)

## 2019-01-16 NOTE — Progress Notes (Signed)
Pt transferred to 2W20 for r/o covid-19.

## 2019-01-16 NOTE — Progress Notes (Signed)
Pt states he's feeling better

## 2019-01-16 NOTE — Progress Notes (Signed)
Communication sent to [id number]:[id number] called stated pt did a 3 beat run of vtach. Pt resting, no distress noted.

## 2019-01-16 NOTE — Progress Notes (Signed)
PROGRESS NOTE  Jeff Ray [name]:[id number] DOB: Feb 27, 1956 DOA: 01/09/2019 PCP: Gareth Morgan, MD   LOS: 7 days   Brief Narrative / Interim history: Jeff Ray a 63 y.o.malewho was admitted to [facility] with acute on chronic hypoxic and hypercapnic respiratory failure, likely due to COPD exacerbation  And pneumonia (has had mutliple admissions for this in the past).  -was intubated and admitted to ICU -Patient was extubated on 01/10/2019, [facility] resumed care on 01/12/2019 -Covid 19 PCR was negative -unfortunately continues to have high O2 needs on 8-10L HLNC  Subjective: -Overnight had respiratory distress, O2 sats dropped into the mid 80s despite 8 to 10 L nasal cannula was put on BiPAP last night  Assessment & Plan:  Acute on chronic respiratory failure with hypoxia and hypercapnia  -Due to COPD exacerbation and multifocal pneumonia  -Extubated to 5 L on 01/10/2019.  [facility] assumed care on 4/10. -Influenza panel, RVP, strep antigen, urine Legionella, blood culture and COVID-19 negative. -Completed 7 days of IV cefepime on 4/13 for multifocal pneumonia also treated with IV steroids and nebs  -Despite this continues to have very high O2 requirements , needing 8 to 10 L high flow nasal cannula, hence I retested him for Sars novel coronavirus which resulted negative, repeat CXR -recheck procalcitonin   Leukocytosis:  -likely worsened by steroids -check procalcitonin  Anxiety: On Xanax 1 mg 3 times daily at home. -Resume 0.5 mg 3 times daily  DKA/NIDDM-2: DKA resolved. -CBGs uncontrolled in the setting of IV steroids, increase Lantus -Continue meal coverage and sliding scale insulin  Acute metabolic encephalopathy:  -Resolved.  Likely due to the above.  Hypokalemia: Resolved.  Hyperlipidemia/GERD: Stable. -Continue home medications.  DVT prophylaxis: Subcu Lovenox Code Status: Full code Family Communication: None at bedside Disposition Plan: Remains inpatient  Scheduled Meds:  . ALPRAZolam  0.5 mg Oral TID  . azithromycin  250 mg Oral Daily  . enoxaparin (LOVENOX) injection  40 mg Subcutaneous Q24H  . fluticasone  2 spray Each Nare Daily  . fluticasone furoate-vilanterol  1 puff Inhalation Daily  . insulin aspart  0-15 Units Subcutaneous TID WC  . insulin aspart  0-5 Units Subcutaneous QHS  . insulin aspart  3 Units Subcutaneous TID WC  . insulin glargine  15 Units Subcutaneous Daily  . loratadine  10 mg Oral Daily  . mouth rinse  15 mL Mouth Rinse q12n4p  . methylPREDNISolone (SOLU-MEDROL) injection  80 mg Intravenous Q12H  . montelukast  10 mg Oral QHS  . oxybutynin  5 mg Oral TID  . pantoprazole  40 mg Oral Daily  . rosuvastatin  40 mg Oral QHS  . umeclidinium bromide  1 puff Inhalation Daily   Continuous Infusions: . sodium chloride 10 mL/hr at 01/11/19 1100   PRN Meds:.sodium chloride    Consultants:   None  Procedures:   ETT 4/7-4/8  Antimicrobials:  Cefepime 4/7-4/30  Azithromycin 4/7-4/8  Ceftriaxone 4/7  Vancomycin 4/7-4/8  Objective: Vitals:   01/16/19 0305 01/16/19 0426 01/16/19 0936 01/16/19 1314  BP:  110/67  135/68  Pulse:  97  93  Resp:  (!) 22    Temp:    97.9 F (36.6 C)  TempSrc:    Oral  SpO2: 95% 95% 95% 98%  Weight:  77.4 kg    Height:        Intake/Output Summary (Last 24 hours) at 01/16/2019 1429 Last data filed at 01/16/2019 0432 Gross per 24 hour  Intake -  Output 1000 ml  Net -1000 ml   Filed Weights   01/11/19 0500 01/15/19 0500 01/16/19 0426  Weight: 76.4 kg 78.3 kg 77.4 kg    Examination:  Gen: Awake alert, ill-appearing male sitting up in bed bed, mild distress HEENT: PERRLA, Neck supple, no JVD Lungs: Poor air movement bilaterally, no expiratory wheezes, few scattered rhonchi left base  CVS: RRR,No Gallops,Rubs or new Murmurs Abd: soft, Non tender, non distended, BS present Extremities: No edema Skin: no new rashes PSYCH: Calm. Normal affect.    Data Reviewed: I have  independently reviewed following labs and imaging studies   CBC: Recent Labs  Lab 01/12/19 0328 01/13/19 0514 01/14/19 0647 01/15/19 0428 01/16/19 0419  WBC 21.9* 24.5* 24.6* 26.5* 29.4*  NEUTROABS 18.2*  --   --   --   --   HGB 10.6* 10.6* 10.4* 11.3* 10.8*  HCT 32.9* 34.3* 33.9* 35.5* 34.9*  MCV 85.5 86.0 86.3 87.0 87.7  PLT 386 330 299 268 255   Basic Metabolic Panel: Recent Labs  Lab 01/10/19 0617  01/11/19 0319 01/12/19 0328 01/13/19 0514 01/14/19 0647 01/16/19 0419  NA 139   < > 143 141 136 135 137  K 3.6   < > 3.7 2.9* 3.8 3.8 5.2*  CL 95*   < > 102 99 96* 94* 97*  CO2 32   < > 31 32 27 29 27   GLUCOSE 225*   < > 226* 174* 135* 127* 257*  BUN 22   < > 22 16 12 12 15   CREATININE 1.02   < > 1.02 0.85 0.90 0.90 1.02  CALCIUM 10.3   < > 9.7 9.4 9.3 9.4 9.6  MG 2.6*  --  2.1 2.0  --   --   --   PHOS 1.5*  --  2.7 2.7  --   --   --    < > = values in this interval not displayed.   GFR: Estimated Creatinine Clearance: 73.2 mL/min (by C-G formula based on SCr of 1.02 mg/dL). Liver Function Tests: No results for input(s): AST, ALT, ALKPHOS, BILITOT, PROT, ALBUMIN in the last 168 hours. No results for input(s): LIPASE, AMYLASE in the last 168 hours. No results for input(s): AMMONIA in the last 168 hours. Coagulation Profile: No results for input(s): INR, PROTIME in the last 168 hours. Cardiac Enzymes: No results for input(s): CKTOTAL, CKMB, CKMBINDEX, TROPONINI in the last 168 hours. BNP (last 3 results) No results for input(s): PROBNP in the last 8760 hours. HbA1C: No results for input(s): HGBA1C in the last 72 hours. CBG: Recent Labs  Lab 01/15/19 1154 01/15/19 1647 01/15/19 2154 01/16/19 0811 01/16/19 1326  GLUCAP 194* 193* 163* 249* 278*   Lipid Profile: No results for input(s): CHOL, HDL, LDLCALC, TRIG, CHOLHDL, LDLDIRECT in the last 72 hours. Thyroid Function Tests: No results for input(s): TSH, T4TOTAL, FREET4, T3FREE, THYROIDAB in the last 72  hours. Anemia Panel: No results for input(s): VITAMINB12, FOLATE, FERRITIN, TIBC, IRON, RETICCTPCT in the last 72 hours. Urine analysis:    Component Value Date/Time   COLORURINE YELLOW 01/09/2019 1809   APPEARANCEUR HAZY (A) 01/09/2019 1809   LABSPEC 1.028 01/09/2019 1809   PHURINE 5.0 01/09/2019 1809   GLUCOSEU >=500 (A) 01/09/2019 1809   HGBUR NEGATIVE 01/09/2019 1809   BILIRUBINUR NEGATIVE 01/09/2019 1809   KETONESUR 80 (A) 01/09/2019 1809   PROTEINUR NEGATIVE 01/09/2019 1809   UROBILINOGEN 0.2 02/08/2008 1731   NITRITE NEGATIVE 01/09/2019 1809   LEUKOCYTESUR NEGATIVE 01/09/2019 1809  Sepsis Labs: Invalid input(s): PROCALCITONIN, LACTICIDVEN  Recent Results (from the past 240 hour(s))  Blood culture (routine x 2)     Status: Abnormal   Collection Time: 01/09/19 12:06 PM  Result Value Ref Range Status   Specimen Description   Final    BLOOD RIGHT ARM UPPER Performed at [facility], [address] [place], [place] [address]    Special Requests   Final    BOTTLES DRAWN AEROBIC AND ANAEROBIC Blood Culture adequate volume Performed at [facility], [address] [place], [place] [address]    Culture  Setup Time   Final    GRAM POSITIVE RODS AEROBIC BOTTLE ONLY Gram Stain Report Called to,Read Back By and Verified With: Teater C. AT [facility] AT 1020A ON [id number] BY THOMPSON S.    Culture (A)  Final    DIPHTHEROIDS(CORYNEBACTERIUM SPECIES) Standardized susceptibility testing for this organism is not available. Performed at [facility], [address], [place], [place] [address]    Report Status 01/13/2019 FINAL  Final  Culture, blood (Routine X 2) w Reflex to ID Panel     Status: None   Collection Time: 01/09/19  2:15 PM  Result Value Ref Range Status   Specimen Description BLOOD RIGHT BRACHIAL  Final   Special Requests AEROBIC BOTTLE ONLY Blood Culture adequate volume  Final   Culture   Final    NO GROWTH 5 DAYS Performed at [facility], [address] [place], [place] [address]    Report Status 01/14/2019 FINAL  Final  Respiratory Panel by PCR     Status: None   Collection Time: 01/09/19  3:58 PM  Result Value Ref Range Status   Adenovirus NOT DETECTED NOT DETECTED Final   Coronavirus 229E NOT DETECTED NOT DETECTED Final    Comment: (NOTE) The Coronavirus on the Respiratory Panel, DOES NOT test for the novel  Coronavirus (2019 nCoV)    Coronavirus HKU1 NOT DETECTED NOT DETECTED Final   Coronavirus NL63 NOT DETECTED NOT DETECTED Final   Coronavirus OC43 NOT DETECTED NOT DETECTED Final   Metapneumovirus NOT DETECTED NOT DETECTED Final   Rhinovirus / Enterovirus NOT DETECTED NOT DETECTED Final   Influenza A NOT DETECTED NOT DETECTED Final   Influenza B NOT DETECTED NOT DETECTED Final   Parainfluenza Virus 1 NOT DETECTED NOT DETECTED Final   Parainfluenza Virus 2 NOT DETECTED NOT DETECTED Final   Parainfluenza Virus 3 NOT DETECTED NOT DETECTED Final   Parainfluenza Virus 4 NOT DETECTED NOT DETECTED Final   Respiratory Syncytial Virus NOT DETECTED NOT DETECTED Final   Bordetella pertussis NOT DETECTED NOT DETECTED Final   Chlamydophila pneumoniae NOT DETECTED NOT DETECTED Final   Mycoplasma pneumoniae NOT DETECTED NOT DETECTED Final    Comment: Performed at [facility], [address], [place], [place] [address]  Novel Coronavirus, NAA (hospital order; send-out to ref lab)     Status: None   Collection Time: 01/09/19  4:19 PM  Result Value Ref Range Status   SARS-CoV-2, NAA NOT DETECTED NOT DETECTED Final    Comment: Negative (Not Detected) results do not exclude infection caused by SARS CoV 2 and should not be used as the sole basis for treatment or other patient management decisions. Optimum specimen types and timing for peak viral levels during infections caused  by SARS CoV 2 have not been determined. Collection of multiple specimens (types and time points) from the same patient may be necessary to detect the virus.  Improper  specimen collection and handling, sequence variability underlying assay primers and or probes, or the presence of organisms in  quantities less than the limit of detection of the assay may lead to false negative results. Positive and negative predictive values of testing are highly dependent on prevalence. False negative results are more likely when prevalence of disease is high. (NOTE) The expected result is Negative (Not Detected). The SARS CoV 2 test is intended for the presumptive qualitative  detection of nucleic acid from SARS CoV 2 in upper and lower  respir atory specimens. Testing methodology is real time RT PCR. Test results must be correlated with clinical presentation and  evaluated in the context of other laboratory and epidemiologic data.  Test performance can be affected because the epidemiology and  clinical spectrum of infection caused by SARS CoV 2 is not fully  known. For example, the optimum types of specimens to collect and  when during the course of infection these specimens are most likely  to contain detectable viral RNA may not be known. This test has not been Food and Drug Administration (FDA) cleared or  approved and has been authorized by FDA under an Emergency Use  Authorization (EUA). The test is only authorized for the duration of  the declaration that circumstances exist justifying the authorization  of emergency use of in vitro diagnostic tests for detection and or  diagnosis of SARS CoV 2 under Section 564(b)(1) of the Act, 21 U.S.C.  section 4025102057 3(b)(1), unless the authorization is terminated or   revoked sooner. [facility] is certified under the  Clinical Laboratory Improvement Amendments of 1988 (CLIA), 42 U.S.C.  section [phone], to perform high complexity tests. Performed at [facility] CLIA [id number] [address], [address], [address], [place], [place] [address] Laboratory Director: Turner Daniels, MD Fact  Sheet for Healthcare Providers  https://pope.com/ Fact Sheet for Patients  [email or web address] Performed at [facility], [address], [place], [place] [address]    Coronavirus Source NASOPHARYNGEAL  Final    Comment: Performed at [facility], [address] [place], [place] [address]  MRSA PCR Screening     Status: None   Collection Time: 01/10/19  2:11 AM  Result Value Ref Range Status   MRSA by PCR NEGATIVE NEGATIVE Final    Comment:        The GeneXpert MRSA Assay (FDA approved for NASAL specimens only), is one component of a comprehensive MRSA colonization surveillance program. It is not intended to diagnose MRSA infection nor to guide or monitor treatment for MRSA infections. Performed at [facility], [address] [place], [place] [address]   Culture, respiratory (non-expectorated)     Status: None   Collection Time: 01/10/19  8:49 AM  Result Value Ref Range Status   Specimen Description TRACHEAL ASPIRATE  Final   Special Requests NONE  Final   Gram Stain   Final    RARE WBC PRESENT,BOTH PMN AND MONONUCLEAR NO ORGANISMS SEEN    Culture   Final    RARE Consistent with normal respiratory flora. Performed at [facility], [address] [place], [place] [address]    Report Status 01/12/2019 FINAL  Final      Radiology Studies: No results found.  Zannie Cove, MD  01/16/2019, 2:29 PM

## 2019-01-16 NOTE — Progress Notes (Signed)
Zannie Cove MD paged to notify pt arrived to unit with c/o chest pain.

## 2019-01-17 LAB — BASIC METABOLIC PANEL
Anion gap: 10 (ref 5–15)
BUN: 22 mg/dL (ref 8–23)
CO2: 34 mmol/L — ABNORMAL HIGH (ref 22–32)
Calcium: 10.1 mg/dL (ref 8.9–10.3)
Chloride: 94 mmol/L — ABNORMAL LOW (ref 98–111)
Creatinine, Ser: 1.14 mg/dL (ref 0.61–1.24)
GFR calc Af Amer: >60 mL/min (ref >60)
GFR calc non Af Amer: >60 mL/min (ref >60)
Glucose, Bld: 241 mg/dL — ABNORMAL HIGH (ref 70–99)
Potassium: 5.1 mmol/L (ref 3.5–5.1)
Sodium: 138 mmol/L (ref 135–145)

## 2019-01-17 LAB — CBC
HCT: 35.1 % — ABNORMAL LOW (ref 39.0–52.0)
Hemoglobin: 10.7 g/dL — ABNORMAL LOW (ref 13.0–17.0)
MCH: 27 pg (ref 26.0–34.0)
MCHC: 30.5 g/dL (ref 30.0–36.0)
MCV: 88.6 fL (ref 80.0–100.0)
Platelets: 338 10*3/uL (ref 150–400)
RBC: 3.96 MIL/uL — ABNORMAL LOW (ref 4.22–5.81)
RDW: 14 % (ref 11.5–15.5)
WBC: 31.4 10*3/uL — ABNORMAL HIGH (ref 4.0–10.5)
nRBC: 0 % (ref 0.0–0.2)

## 2019-01-17 LAB — GLUCOSE, CAPILLARY
Glucose-Capillary: 259 mg/dL — ABNORMAL HIGH (ref 70–99)
Glucose-Capillary: 188 mg/dL — ABNORMAL HIGH (ref 70–99)
Glucose-Capillary: 155 mg/dL — ABNORMAL HIGH (ref 70–99)
Glucose-Capillary: 200 mg/dL — ABNORMAL HIGH (ref 70–99)

## 2019-01-17 LAB — HEMOGLOBIN A1C
Hgb A1c MFr Bld: 11.1 % — ABNORMAL HIGH (ref 4.8–5.6)
Mean Plasma Glucose: 272 mg/dL

## 2019-01-17 LAB — PROCALCITONIN: Procalcitonin: 0.31 ng/mL

## 2019-01-17 MED ORDER — METHYLPREDNISOLONE SODIUM SUCC 125 MG IJ SOLR
60.0000 mg | Freq: Two times a day (BID) | INTRAMUSCULAR | Status: DC
  Administered 2019-01-17 – 2019-01-19 (×5): 60 mg via INTRAVENOUS
  Filled 2019-01-17 (×6): qty 2

## 2019-01-17 MED ORDER — ALBUTEROL SULFATE HFA 108 (90 BASE) MCG/ACT IN AERS
1.0000 | INHALATION_SPRAY | RESPIRATORY_TRACT | Status: DC | PRN
  Administered 2019-01-18: 2 via RESPIRATORY_TRACT
  Filled 2019-01-17: qty 6.7

## 2019-01-17 NOTE — Progress Notes (Signed)
PROGRESS NOTE  Jeff Ray SNK:[id number] DOB: 10/28/1955 DOA: 01/09/2019 PCP: Gareth Morgan, MD   LOS: 8 days   Brief Narrative / Interim history: Kimberlee Jeff Wilsonis a 63 y.o.malewith history of asthma/COPD, chronic respiratory failure on 2 to 3 L home O2 at baseline, BPH, type 2 diabetes was admitted to any [facility] with acute on chronic hypoxic and hypercapnic Respiratory failure due to COPD exacerbation and pneumonia. -He was intubated and admitted to the ICU  -Extubated on 4/8, transferred to [facility] service 4/10 -COVID-19 PCR was negative -Unfortunately continues to have high O2 demands currently on 8 L high flow nasal cannula, down from 10L  Subjective: -No events overnight, has mild cough, overall feels that his breathing is improving -Reports that he is not very ambulatory at baseline, uses a power wheelchair  Assessment & Plan:  Acute on chronic respiratory failure with hypoxia and hypercapnia  -Due to COPD exacerbation and multifocal pneumonia  -Extubated to 5 L on 01/10/2019.  TRH assumed care on 4/10. -Influenza panel, RVP, strep antigen, urine Legionella, blood culture and COVID-19 negative. -Completed 7 days of IV cefepime on 4/13 for multifocal pneumonia also treated with IV steroids and nebs  -Despite this continues to have very high O2 requirements , needing 8 to 10 L high flow nasal cannula,  I retested him for [name] novel coronavirus which resulted negative, repeat CXR unchanged -Slight clinical improvement, procalcitonin level has trended down which argues against recurrent bacterial superinfection, suspect his leukocytosis is reactive and secondary to steroids -Cutdown IV Solu-Medrol, continue duo nebs for now  Leukocytosis:  -likely worsened by steroids, is afebrile, nontoxic -procalcitonin has trended down, monitor  Anxiety: On Xanax 1 mg 3 times daily at home. -Resume 0.5 mg 3 times daily  DKA/NIDDM-2: DKA resolved. -CBG's improving on higher doses of  Lantus, anticipate further improvement with decreasing steroid doses -Continue meal coverage and sliding scale insulin  Acute metabolic encephalopathy:  -Resolved.  Likely due to the above.  Hypokalemia:  -Resolved.  Hyperlipidemia/GERD: - Stable.  DVT prophylaxis: Subcu Lovenox Code Status: Full code Family Communication: None at bedside Disposition Plan: Remains in stepdown, hopefully home in few days  Scheduled Meds:  ALPRAZolam  0.5 mg Oral TID   enoxaparin (LOVENOX) injection  40 mg Subcutaneous Q24H   fluticasone  2 spray Each Nare Daily   fluticasone furoate-vilanterol  1 puff Inhalation Daily   insulin aspart  0-15 Units Subcutaneous TID WC   insulin aspart  0-5 Units Subcutaneous QHS   insulin aspart  3 Units Subcutaneous TID WC   insulin glargine  25 Units Subcutaneous Daily   loratadine  10 mg Oral Daily   mouth rinse  15 mL Mouth Rinse q12n4p   methylPREDNISolone (SOLU-MEDROL) injection  60 mg Intravenous Q12H   montelukast  10 mg Oral QHS   oxybutynin  5 mg Oral TID   pantoprazole  40 mg Oral Daily   rosuvastatin  40 mg Oral QHS   umeclidinium bromide  1 puff Inhalation Daily   Continuous Infusions:  sodium chloride 10 mL/hr at 01/11/19 1100   PRN Meds:.sodium chloride    Consultants:   None  Procedures:   ETT 4/7-4/8  Antimicrobials:  Cefepime 4/7-4/30  Azithromycin 4/7-4/8  Ceftriaxone 4/7  Vancomycin 4/7-4/8  Objective: Vitals:   01/16/19 1314 01/16/19 1907 01/16/19 2320 01/17/19 0856  BP: 135/68  117/69 136/65  Pulse: 93  88 92  Resp:   20 18  Temp: 97.9 F (36.6 C)  (!)  97.4 F (36.3 C) 98.5 F (36.9 C)  TempSrc: Oral  Oral Oral  SpO2: 98% 96% 96%   Weight:      Height:        Intake/Output Summary (Last 24 hours) at 01/17/2019 1100 Last data filed at 01/17/2019 1000 Gross per 24 hour  Intake 417 ml  Output 600 ml  Net -183 ml   Filed Weights   01/11/19 0500 01/15/19 0500 01/16/19 0426  Weight: 76.4  kg 78.3 kg 77.4 kg    Examination:  Gen: Wake alert, chronically ill-appearing male sitting up in bed, oriented x3, no distress today HEENT: PERRLA, Neck supple, no JVD Lungs: Poor air movement bilaterally few scattered rhonchi especially at the left base CVS: S1-S2/regular rate rhythm, tachycardic Abd: soft, Non tender, non distended, BS present Extremities: No edema Skin: no new rashes PSYCH: Calm. Normal affect.    Data Reviewed: I have independently reviewed following labs and imaging studies   CBC: Recent Labs  Lab 01/12/19 0328 01/13/19 0514 01/14/19 0647 01/15/19 0428 01/16/19 0419 01/17/19 0557  WBC 21.9* 24.5* 24.6* 26.5* 29.4* 31.4*  NEUTROABS 18.2*  --   --   --   --   --   HGB 10.6* 10.6* 10.4* 11.3* 10.8* 10.7*  HCT 32.9* 34.3* 33.9* 35.5* 34.9* 35.1*  MCV 85.5 86.0 86.3 87.0 87.7 88.6  PLT 386 330 299 268 255 338   Basic Metabolic Panel: Recent Labs  Lab 01/11/19 0319 01/12/19 0328 01/13/19 0514 01/14/19 0647 01/16/19 0419 01/16/19 1814 01/17/19 0557  NA 143 141 136 135 137  --  138  K 3.7 2.9* 3.8 3.8 5.2*  --  5.1  CL 102 99 96* 94* 97*  --  94*  CO2 31 32 --  34*  GLUCOSE 226* 174* 135* 127* 257*  --  241*  BUN --  22  CREATININE 1.02 0.85 0.90 0.90 1.02  --  1.14  CALCIUM 9.7 9.4 9.3 9.4 9.6  --  10.1  MG 2.1 2.0  --   --   --  2.3  --   PHOS 2.7 2.7  --   --   --   --   --    GFR: Estimated Creatinine Clearance: 65.5 mL/min (by C-G formula based on SCr of 1.14 mg/dL). Liver Function Tests: No results for input(s): AST, ALT, ALKPHOS, BILITOT, PROT, ALBUMIN in the last 168 hours. No results for input(s): LIPASE, AMYLASE in the last 168 hours. No results for input(s): AMMONIA in the last 168 hours. Coagulation Profile: No results for input(s): INR, PROTIME in the last 168 hours. Cardiac Enzymes: No results for input(s): CKTOTAL, CKMB, CKMBINDEX, TROPONINI in the last 168 hours. BNP (last 3 results) No results  for input(s): PROBNP in the last 8760 hours. HbA1C: Recent Labs    01/16/19 0419  HGBA1C 11.1*   CBG: Recent Labs  Lab 01/16/19 0811 01/16/19 1326 01/16/19 1646 01/16/19 2103 01/17/19 0854  GLUCAP 249* 278* 260* 170* 259*   Lipid Profile: No results for input(s): CHOL, HDL, LDLCALC, TRIG, CHOLHDL, LDLDIRECT in the last 72 hours. Thyroid Function Tests: No results for input(s): TSH, T4TOTAL, FREET4, T3FREE, THYROIDAB in the last 72 hours. Anemia Panel: No results for input(s): VITAMINB12, FOLATE, FERRITIN, TIBC, IRON, RETICCTPCT in the last 72 hours. Urine analysis:    Component Value Date/Time   COLORURINE YELLOW 01/09/2019 1809   APPEARANCEUR HAZY (A) 01/09/2019 1809   LABSPEC 1.028 01/09/2019  1809   PHURINE 5.0 01/09/2019 1809   GLUCOSEU >=500 (A) 01/09/2019 1809   HGBUR NEGATIVE 01/09/2019 1809   BILIRUBINUR NEGATIVE 01/09/2019 1809   KETONESUR 80 (A) 01/09/2019 1809   PROTEINUR NEGATIVE 01/09/2019 1809   UROBILINOGEN 0.2 02/08/2008 1731   NITRITE NEGATIVE 01/09/2019 1809   LEUKOCYTESUR NEGATIVE 01/09/2019 1809   Sepsis Labs: Invalid input(s): PROCALCITONIN, LACTICIDVEN  Recent Results (from the past 240 hour(s))  Blood culture (routine x 2)     Status: Abnormal   Collection Time: 01/09/19 12:06 PM  Result Value Ref Range Status   Specimen Description   Final    BLOOD RIGHT ARM UPPER Performed at [facility], [address] [place], [place] [address]    Special Requests   Final    BOTTLES DRAWN AEROBIC AND ANAEROBIC Blood Culture adequate volume Performed at [facility], [address] [place], [place] [address]    Culture  Setup Time   Final    GRAM POSITIVE RODS AEROBIC BOTTLE ONLY Gram Stain Report Called to,Read Back By and Verified With: Martone C. AT [facility] AT 1020A ON [id number] BY THOMPSON S.    Culture (A)  Final    DIPHTHEROIDS(CORYNEBACTERIUM SPECIES) Standardized susceptibility testing for this organism is not available. Performed at [facility], [address] [place], [place] [address]    Report Status 01/13/2019 FINAL  Final  Culture, blood (Routine X 2) w Reflex to ID Panel     Status: None   Collection Time: 01/09/19  2:15 PM  Result Value Ref Range Status   Specimen Description BLOOD RIGHT BRACHIAL  Final   Special Requests AEROBIC BOTTLE ONLY Blood Culture adequate volume  Final   Culture   Final    NO GROWTH 5 DAYS Performed at [facility], [address] [place], [place] [address]    Report Status 01/14/2019 FINAL  Final  Respiratory Panel by PCR     Status: None   Collection Time: 01/09/19  3:58 PM  Result Value Ref Range Status   Adenovirus NOT DETECTED NOT DETECTED Final   Coronavirus 229E NOT DETECTED NOT DETECTED Final    Comment: (NOTE) The Coronavirus on the Respiratory Panel, DOES NOT test for the novel  Coronavirus (2019 nCoV)    Coronavirus HKU1 NOT DETECTED NOT DETECTED Final   Coronavirus NL63 NOT DETECTED NOT DETECTED Final   Coronavirus OC43 NOT DETECTED NOT DETECTED Final   Metapneumovirus NOT DETECTED NOT DETECTED Final   Rhinovirus / Enterovirus NOT DETECTED NOT DETECTED Final   Influenza A NOT DETECTED NOT DETECTED Final   Influenza B NOT DETECTED NOT DETECTED Final   Parainfluenza Virus 1 NOT DETECTED NOT DETECTED Final   Parainfluenza Virus 2 NOT DETECTED NOT DETECTED Final   Parainfluenza Virus 3 NOT DETECTED NOT DETECTED Final   Parainfluenza Virus 4 NOT DETECTED NOT DETECTED Final   Respiratory Syncytial Virus NOT DETECTED NOT DETECTED Final   Bordetella pertussis NOT DETECTED NOT DETECTED Final   Chlamydophila pneumoniae NOT DETECTED NOT DETECTED Final   Mycoplasma pneumoniae NOT DETECTED NOT DETECTED Final    Comment: Performed at [facility], [address] [place], [place] [address]  Novel Coronavirus, NAA (hospital order; send-out to ref lab)     Status: None   Collection Time: 01/09/19  4:19 PM  Result Value Ref Range Status   SARS-CoV-2, NAA NOT DETECTED  NOT DETECTED Final    Comment: Negative (Not Detected) results do not exclude infection caused  by SARS CoV 2 and should not be used as the sole basis for treatment or other patient management decisions. Optimum specimen types and timing for peak viral levels during infections caused  by SARS CoV 2 have not been determined. Collection of multiple specimens (types and time points) from the same patient may be necessary to detect the virus. Improper specimen collection and handling, sequence variability underlying assay primers and or probes, or the presence of organisms in  quantities less than the limit of detection of the assay may lead to false negative results. Positive and negative predictive values of testing are highly dependent on prevalence. False negative results are more likely when prevalence of disease is high. (NOTE) The expected result is Negative (Not Detected). The SARS CoV 2 test is intended for the presumptive qualitative  detection of nucleic acid from SARS CoV 2 in upper and lower  respir atory specimens. Testing methodology is real time RT PCR. Test results must be correlated with clinical presentation and  evaluated in the context of other laboratory and epidemiologic data.  Test performance can be affected because the epidemiology and  clinical spectrum of infection caused by SARS CoV 2 is not fully  known. For example, the optimum types of specimens to collect and  when during the course of infection these specimens are most likely  to contain detectable viral RNA may not be known. This test has not been Food and Drug Administration (FDA) cleared or  approved and has been authorized by FDA under an Emergency Use  Authorization (EUA). The test is only authorized for the duration of  the declaration that circumstances exist justifying the authorization  of emergency use of in vitro diagnostic tests for detection and or  diagnosis of SARS CoV 2 under Section 564(b)(1) of  the Act, 21 U.S.C.  section [id number] 3(b)(1), unless the authorization is terminated or   revoked sooner. [facility] is certified under the  Clinical Laboratory Improvement Amendments of 1988 (CLIA), 42 U.S.C.  section 5615691278263a, to perform high complexity tests. Performed at [facility] CLIA [id number] [address], [address], [place], [place] [address] Laboratory Director: Turner DanielsJoseph H. Willman, MD Fact Sheet for Healthcare Providers  https://pope.com/https://www.fda.gov/media/136313/download Fact Sheet for Patients  BoilerBrush.com.cyhttps://www.fda.gov/media/136312/download Performed at [facility], [address] [place], [place] [address]    Coronavirus Source NASOPHARYNGEAL  Final    Comment: Performed at [facility], [address] [place], [place] [address]  MRSA PCR Screening     Status: None   Collection Time: 01/10/19  2:11 AM  Result Value Ref Range Status   MRSA by PCR NEGATIVE NEGATIVE Final    Comment:        The GeneXpert MRSA Assay (FDA approved for NASAL specimens only), is one component of a comprehensive MRSA colonization surveillance program. It is not intended to diagnose MRSA infection nor to guide or monitor treatment for MRSA infections. Performed at [facility], [address] [place], [place] [address]   Culture, respiratory (non-expectorated)     Status: None   Collection Time: 01/10/19  8:49 AM  Result Value Ref Range Status   Specimen Description TRACHEAL ASPIRATE  Final   Special Requests NONE  Final   Gram Stain   Final    RARE WBC PRESENT,BOTH PMN AND MONONUCLEAR NO ORGANISMS SEEN    Culture   Final    RARE Consistent with normal respiratory flora. Performed at [facility],  [address] [place], [place] [address]    Report Status 01/12/2019 FINAL  Final  SARS Coronavirus 2 [facility] order, Performed in [facility] hospital lab)     Status: None   Collection Time: 01/16/19  1:10 PM  Result Value Ref Range  Status   SARS Coronavirus 2 NEGATIVE NEGATIVE Final    Comment: (NOTE) If result is NEGATIVE SARS-CoV-2 target nucleic acids are NOT DETECTED. The SARS-CoV-2 RNA is generally detectable in upper and lower  respiratory specimens during the acute phase of infection. The lowest  concentration of SARS-CoV-2 viral copies this assay can detect is 250  copies / mL. A negative result does not preclude SARS-CoV-2 infection  and should not be used as the sole basis for treatment or other  patient management decisions.  A negative result may occur with  improper specimen collection / handling, submission of specimen other  than nasopharyngeal swab, presence of viral mutation(s) within the  areas targeted by this assay, and inadequate number of viral copies  (<250 copies / mL). A negative result must be combined with clinical  observations, patient history, and epidemiological information. If result is POSITIVE SARS-CoV-2 target nucleic acids are DETECTED. The SARS-CoV-2 RNA is generally detectable in upper and lower  respiratory specimens dur ing the acute phase of infection.  Positive  results are indicative of active infection with SARS-CoV-2.  Clinical  correlation with patient history and other diagnostic information is  necessary to determine patient infection status.  Positive results do  not rule out bacterial infection or co-infection with other viruses. If result is PRESUMPTIVE POSTIVE SARS-CoV-2 nucleic acids MAY BE PRESENT.   A presumptive positive result was obtained on the submitted specimen  and confirmed on repeat testing.  While 2019 novel coronavirus  (SARS-CoV-2) nucleic acids may be present in the submitted sample  additional confirmatory testing may be necessary for epidemiological  and / or clinical management purposes  to differentiate between  SARS-CoV-2 and other Sarbecovirus currently known to infect humans.  If clinically indicated additional testing with an alternate  test  methodology [phone]) is advised. The SARS-CoV-2 RNA is generally  detectable in upper and lower respiratory sp ecimens during the acute  phase of infection. The expected result is Negative. Fact Sheet for Patients:  BoilerBrush.com.cy Fact Sheet for Healthcare Providers: https://pope.com/ This test is not yet approved or cleared by the [place] FDA and has been authorized for detection and/or diagnosis of SARS-CoV-2 by FDA under an Emergency Use Authorization (EUA).  This EUA will remain in effect (meaning this test can be used) for the duration of the COVID-19 declaration under Section 564(b)(1) of the Act, 21 U.S.C. section 360bbb-3(b)(1), unless the authorization is terminated or revoked sooner. Performed at [facility], [address] [place], [place] [address]       Radiology Studies: Dg Chest [place] 1 View  Result Date: 01/16/2019 CLINICAL DATA:  63 year old male with pneumonia and acute on chronic respiratory failure with hypercapnia EXAM: PORTABLE CHEST 1 VIEW COMPARISON:  Multiple prior chest x-rays, most recently 01/15/2019 FINDINGS: Stable appearance of the chest without significant interval change over the last 24 hours. Persistent interstitial and airspace opacities in the left upper and left lower lobes. No evidence of new airspace opacity, pleural effusion, pneumothorax or pulmonary edema. Stable background hyperinflation, emphysematous changes and chronic bronchitic changes. IMPRESSION: Stable chest x-ray without significant interval change. Persistent left upper and left lower lobe airspace opacities may represent residual areas of infiltrate/pneumonia versus  developing pleuroparenchymal scarring. Electronically Signed   By: Malachy Moan M.D.   On: 01/16/2019 14:58    Zannie Cove, MD  01/17/2019, 11:00 AM

## 2019-01-17 NOTE — Progress Notes (Signed)
Occupational Therapy Treatment Patient Details Name: Jeff Ray MRN: [id number] DOB: [birth year] Today's Date: 01/17/2019    History of present illness Jeff Fahner Wilsonis a 63 y.o.malewho was admitted to [facility] with acute on chronic hypoxic and hypercapnic respiratory failure, likely due to COPD exacerbation (has had mutliple admissions for this in the past). Was deemed to be a COVID-19 rule out per ID; therefore, required intubation prior to transfer to [facility] (was on BiPAP prior). Extubated 01/10/19, on Bernie; as of 4/12, Covid 19 not detected   OT comments  Pt progressing towards established OT goals. However, continues to present with significant fatigue and requiring long rest breaks during ADLs and short distance mobility. Pt performing mobility to sink and then sitting to perform oral care after several minutes of seated rest with UEs and head resting on sink. Pt SpO2 >95% on 8-9L O2. Cues for purse lip breathing. Pt very motivated to participate in therapy despite fatigue and appreciated being able to see O2 monitor. Continue to recommend follow up therapy with HHOT and will continue to follow acutely as admitted.    Follow Up Recommendations  Home health OT    Equipment Recommendations  None recommended by OT    Recommendations for Other Services      Precautions / Restrictions Precautions Precautions: Fall Precaution Comments: desats very quickly even on 10Lo2 via [place] Restrictions Weight Bearing Restrictions: No       Mobility Bed Mobility Overal bed mobility: Needs Assistance Bed Mobility: Supine to Sit     Supine to sit: Modified independent (Device/Increase time)     General bed mobility comments: HOB elevated, increased time and required rest once EOB  Transfers Overall transfer level: Needs assistance Equipment used: None Transfers: Sit to/from Stand Sit to Stand: Min guard         General transfer comment: pt pushed up from bed and didn't require physical  assist    Balance Overall balance assessment: Needs assistance   Sitting balance-Leahy Scale: Good     Standing balance support: Bilateral upper extremity supported Standing balance-Leahy Scale: Poor Standing balance comment: Reliant on UE support                           ADL either performed or assessed with clinical judgement   ADL Overall ADL's : Needs assistance/impaired     Grooming: Sitting;Oral care;Minimal assistance Grooming Details (indicate cue type and reason): Min A for squeezing tooth paste Fatigues quickly and resting head on sink while seated on BSC. SpO2 98-95%% on 8-9L O2                 Toilet Transfer: Min guard;Ambulation;BSC Toilet Transfer Details (indicate cue type and reason): Min Guard A for safety. Able to push up without physical A         Functional mobility during ADLs: Min guard;Minimal assistance(using furnature and object to place hands on) General ADL Comments: Pt motivated to partciiapte in therapy. Performing grooming at sink . Required significant rest breaks for several minutes with head resting on sink     Vision       Perception     Praxis      Cognition Arousal/Alertness: Awake/alert Behavior During Therapy: WFL for tasks assessed/performed Overall Cognitive Status: Within Functional Limits for tasks assessed  General Comments: Difficult to assess due to fatigue. Following commands        Exercises     Shoulder Instructions       General Comments SpO2 dropping to 88% on 7L. Elevating to 8-9L O2 and pt SpO2 >95% during activity    Pertinent Vitals/ Pain       Pain Assessment: No/denies pain  Home Living                                          Prior Functioning/Environment              Frequency  Min 2X/week        Progress Toward Goals  OT Goals(current goals can now be found in the care plan section)  Progress  towards OT goals: Progressing toward goals  Acute Rehab OT Goals Patient Stated Goal: go home OT Goal Formulation: With patient Time For Goal Achievement: 01/29/19 Potential to Achieve Goals: Good ADL Goals Pt Will Perform Grooming: with set-up;sitting(at sink on BSC) Pt Will Perform Upper Body Dressing: with set-up;sitting Pt Will Perform Lower Body Dressing: with supervision;sit to/from stand Pt Will Transfer to Toilet: with supervision;ambulating(with cane) Pt Will Perform Toileting - Clothing Manipulation and hygiene: with supervision;sit to/from stand Additional ADL Goal #1: Pt will utilize energy conservation strategies and breathing techniques during ADL and mobility.  Plan Discharge plan remains appropriate    Co-evaluation                 AM-PAC OT "6 Clicks" Daily Activity     Outcome Measure   Help from another person eating meals?: None Help from another person taking care of personal grooming?: A Little Help from another person toileting, which includes using toliet, bedpan, or urinal?: A Little Help from another person bathing (including washing, rinsing, drying)?: A Lot Help from another person to put on and taking off regular upper body clothing?: A Little Help from another person to put on and taking off regular lower body clothing?: A Lot 6 Click Score: 17    End of Session Equipment Utilized During Treatment: Oxygen(8-9L)  OT Visit Diagnosis: Other (comment)(decreased activity tolerance)   Activity Tolerance Patient limited by fatigue   Patient Left in bed;with call bell/phone within reach   Nurse Communication Mobility status;Other (comment)(SpO2)        Time: [id number] OT Time Calculation (min): 52 min  Charges: OT General Charges $OT Visit: 1 Visit OT Treatments $Self Care/Home Management : 38-52 mins  Cyle Kenyon MSOT, OTR/L Acute Rehab Pager: [phone] Office: [phone]   Theodoro GristCharis M Arlicia Paquette 01/17/2019, 4:27 PM

## 2019-01-18 LAB — BASIC METABOLIC PANEL
Anion gap: 10 (ref 5–15)
BUN: 25 mg/dL — ABNORMAL HIGH (ref 8–23)
CO2: 32 mmol/L (ref 22–32)
Calcium: 9.9 mg/dL (ref 8.9–10.3)
Chloride: 94 mmol/L — ABNORMAL LOW (ref 98–111)
Creatinine, Ser: 0.99 mg/dL (ref 0.61–1.24)
GFR calc Af Amer: >60 mL/min (ref >60)
GFR calc non Af Amer: >60 mL/min (ref >60)
Glucose, Bld: 236 mg/dL — ABNORMAL HIGH (ref 70–99)
Potassium: 4.9 mmol/L (ref 3.5–5.1)
Sodium: 136 mmol/L (ref 135–145)

## 2019-01-18 LAB — CBC
HCT: 34.2 % — ABNORMAL LOW (ref 39.0–52.0)
Hemoglobin: 10.3 g/dL — ABNORMAL LOW (ref 13.0–17.0)
MCH: 26.4 pg (ref 26.0–34.0)
MCHC: 30.1 g/dL (ref 30.0–36.0)
MCV: 87.7 fL (ref 80.0–100.0)
Platelets: 291 10*3/uL (ref 150–400)
RBC: 3.9 MIL/uL — ABNORMAL LOW (ref 4.22–5.81)
RDW: 13.7 % (ref 11.5–15.5)
WBC: 26 10*3/uL — ABNORMAL HIGH (ref 4.0–10.5)
nRBC: 0 % (ref 0.0–0.2)

## 2019-01-18 LAB — GLUCOSE, CAPILLARY
Glucose-Capillary: 253 mg/dL — ABNORMAL HIGH (ref 70–99)
Glucose-Capillary: 263 mg/dL — ABNORMAL HIGH (ref 70–99)
Glucose-Capillary: 167 mg/dL — ABNORMAL HIGH (ref 70–99)
Glucose-Capillary: 196 mg/dL — ABNORMAL HIGH (ref 70–99)

## 2019-01-18 MED ORDER — INSULIN GLARGINE 100 UNIT/ML ~~LOC~~ SOLN
30.0000 [IU] | Freq: Every day | SUBCUTANEOUS | Status: DC
  Administered 2019-01-19: 30 [IU] via SUBCUTANEOUS
  Filled 2019-01-18 (×3): qty 0.3

## 2019-01-18 MED ORDER — OXYCODONE-ACETAMINOPHEN 5-325 MG PO TABS
2.0000 | ORAL_TABLET | Freq: Once | ORAL | Status: AC
  Administered 2019-01-18: 2 via ORAL
  Filled 2019-01-18: qty 2

## 2019-01-18 MED ORDER — OXYBUTYNIN CHLORIDE 5 MG PO TABS
5.0000 mg | ORAL_TABLET | Freq: Two times a day (BID) | ORAL | Status: DC
  Administered 2019-01-18 – 2019-01-19 (×2): 5 mg via ORAL
  Filled 2019-01-18 (×2): qty 1

## 2019-01-18 MED ORDER — FUROSEMIDE 10 MG/ML IJ SOLN
20.0000 mg | Freq: Once | INTRAMUSCULAR | Status: AC
  Administered 2019-01-18: 20 mg via INTRAVENOUS
  Filled 2019-01-18: qty 2

## 2019-01-18 MED ORDER — INSULIN ASPART 100 UNIT/ML ~~LOC~~ SOLN
5.0000 [IU] | Freq: Three times a day (TID) | SUBCUTANEOUS | Status: DC
  Administered 2019-01-18 – 2019-01-19 (×3): 5 [IU] via SUBCUTANEOUS

## 2019-01-18 NOTE — Plan of Care (Signed)
  Problem: Clinical Measurements: Goal: Respiratory complications will improve Outcome: Progressing   Problem: Coping: Goal: Level of anxiety will decrease Outcome: Progressing   

## 2019-01-18 NOTE — Progress Notes (Addendum)
Physical Therapy Treatment Patient Details Name: Jeff Ray MRN: [id number] DOB: 03/21/1956 Today's Date: 01/18/2019    History of Present Illness Jeff NearingKarl E Wilsonis a 63 y.o.malewho [facility] with acute on chronic hypoxic and hypercapnic respiratory failure, likely due to COPD exacerbation (has had mutliple admissions for this in the past). Was deemed to be a COVID-19 rule out per ID; therefore, required intubation prior to [facility] (was on BiPAP prior). Extubated 01/10/19, on [place]; as of 4/12, Covid 19 not detected. On 4/14 pt with incr O2 needs and placed on bipap. Pt transferred back to 2W and retested to COVID which was negative again.     PT Comments    Pt continues to demonstrate extremely poor tolerance to activity. Pt sat up on EOB and then had to rest ~5 minutes prior to standing. Pt stood ~ 30 sec most of which was with arms and head propped forward onto walker for respiratory support. Pt then had to sit in tripod position another 5 minutes before he could scoot up the edge of the bed. After another 5 minute rest pt able to return to supine in bed. Pt on 4.5L of O2 with SpO2 >93%. Nursing reported similar pattern when she assisted pt to bsc earlier. At this level I doubt pt will be able to tolerate much activity at home including being up in his power chair. Expect he will be primarily bed bound due to poor activity tolerance. Spoke with wife and she understands how limited he is and feels she can manage him at home. Does not want him to go to SNF due to coronavirus.  Follow Up Recommendations  Home health PT;Supervision/Assistance - 24 hour     Equipment Recommendations  Rolling walker with 5" wheels    Recommendations for Other Services       Precautions / Restrictions Precautions Precautions: Fall Precaution Comments: Very poor activity tolerance Restrictions Weight Bearing Restrictions: No    Mobility  Bed Mobility Overal bed mobility: Needs  Assistance Bed Mobility: Supine to Sit;Sit to Supine     Supine to sit: Supervision;HOB elevated Sit to supine: Min assist;HOB elevated   General bed mobility comments: Incr time to perform. Assist to bring legs back up into bed.  Transfers Overall transfer level: Needs assistance Equipment used: Rolling walker (2 wheeled) Transfers: Sit to/from Stand Sit to Stand: Min guard         General transfer comment: Pt stood from bed with walker. stood upright ~10 sec and then had to prop forward with forearms and head resting on walker for another 30 sec prior to returning to sitting.  Ambulation/Gait             General Gait Details: Unable due to fatigue.   Stairs             Wheelchair Mobility    Modified Rankin (Stroke Patients Only)       Balance Overall balance assessment: Needs assistance Sitting-balance support: Bilateral upper extremity supported Sitting balance-Leahy Scale: Fair Sitting balance - Comments: UE support (tripod) for respiratory status not balance   Standing balance support: Bilateral upper extremity supported Standing balance-Leahy Scale: Poor Standing balance comment: Propped on walker for respiratory status.                            Cognition Arousal/Alertness: Awake/alert Behavior During Therapy: WFL for tasks assessed/performed Overall Cognitive Status: Within Functional Limits for tasks  assessed                                 General Comments: Difficult to assess due to fatigue. Following commands      Exercises      General Comments General comments (skin integrity, edema, etc.): Pt on 4L of O2 with SpO2 >94%      Pertinent Vitals/Pain Pain Assessment: No/denies pain    Home Living                      Prior Function            PT Goals (current goals can now be found in the care plan section) Acute Rehab PT Goals Patient Stated Goal: go home Progress towards PT goals: Not  progressing toward goals - comment(poor activity tolerance)    Frequency    Min 3X/week      PT Plan Current plan remains appropriate    Co-evaluation              AM-PAC PT "6 Clicks" Mobility   Outcome Measure  Help needed turning from your back to your side while in a flat bed without using bedrails?: A Little Help needed moving from lying on your back to sitting on the side of a flat bed without using bedrails?: A Little Help needed moving to and from a bed to a chair (including a wheelchair)?: A Little Help needed standing up from a chair using your arms (e.g., wheelchair or bedside chair)?: A Little Help needed to walk in hospital room?: Total Help needed climbing 3-5 steps with a railing? : Total 6 Click Score: 14    End of Session Equipment Utilized During Treatment: Oxygen Activity Tolerance: Patient limited by fatigue Patient left: in bed;with call bell/phone within reach;with bed alarm set Nurse Communication: Mobility status PT Visit Diagnosis: Other abnormalities of gait and mobility (R26.89);Muscle weakness (generalized) (M62.81)     Time: 8185-6314 PT Time Calculation (min) (ACUTE ONLY): 30 min  Charges:  $Therapeutic Activity: 23-37 mins                     [facility] PT Acute Rehabilitation Services Pager [phone] Office [phone]    Angelina Ok [facility] 01/18/2019, 3:42 PM

## 2019-01-18 NOTE — Progress Notes (Signed)
Pt c/o of left sided chest pain with breathing.  States it is intermittent, holding his left breast.  Pt states it occurs with breathing, rates 8/10.  Appears uncomfortable when this occurs.  HR 86, BP 126/74, sats 100% on 4L [place].  Tele monitor appears sinus rhythm.  Provider on call notified, see new medication orders.

## 2019-01-18 NOTE — TOC Initial Note (Addendum)
Transition of Care [facility]) - Initial/Assessment Note    Patient Details  Name: Jeff Ray MRN: [id number] Date of Birth: 1956/05/23  Transition of Care [facility]) CM/SW Contact:    Leone Haven, RN Phone Number: 01/18/2019, 9:44 AM  Clinical Narrative:                 From home with wife and 2 grandchildren,he has home oxygen with [employer] 2-3 liters.  He also has a neb machine thur [employer], he has no problem getting his medications, he has a power w/chair at home, his wife will be his transportation hopefully at discharge, he has a PCP, Dr. Sudie Bailey.  NCM offered choice for HHPT, HHOT, he chose [facility], list in chart, referral given to [facility] with [facility], she will get back with NCM to see if they can take referral. [facility] called and stated they can take referral.   Expected Discharge Plan: Home w Home Health Services Barriers to Discharge: No Barriers Identified   Patient Goals and CMS Choice Patient states their goals for this hospitalization and ongoing recovery are:: (to get meals and take medicine on time)   Choice offered to / list presented to : Patient  Expected Discharge Plan and Services Expected Discharge Plan: Home w Home Health Services   Discharge Planning Services: CM Consult Post Acute Care Choice: Home Health Living arrangements for the past 2 months: Single Family Home                 DME Arranged: N/A DME Agency: NA HH Arranged: PT, OT HH Agency: [facility]  Prior Living Arrangements/Services Living arrangements for the past 2 months: Single Family Home Lives with:: Spouse Patient language and need for interpreter reviewed:: Yes Do you feel safe going back to the place where you live?: Yes      Need for Family Participation in Patient Care: Yes (Comment) Care giver support system in place?: Yes (comment)   Criminal Activity/Legal Involvement Pertinent to Current Situation/Hospitalization: No - Comment as needed  Activities of Daily  Living Home Assistive Devices/Equipment: Oxygen ADL Screening (condition at time of admission) Patient's cognitive ability adequate to safely complete daily activities?: Yes Is the patient deaf or have difficulty hearing?: No Does the patient have difficulty seeing, even when wearing glasses/contacts?: No Does the patient have difficulty concentrating, remembering, or making decisions?: No Patient able to express need for assistance with ADLs?: Yes Does the patient have difficulty dressing or bathing?: No Independently performs ADLs?: Yes (appropriate for developmental age) Does the patient have difficulty walking or climbing stairs?: Yes Weakness of Legs: Both Weakness of Arms/Hands: None  Permission Sought/Granted                  Emotional Assessment   Attitude/Demeanor/Rapport: Engaged Affect (typically observed): Accepting, Appropriate Orientation: : Oriented to Self, Oriented to Place, Oriented to  Time, Oriented to Situation   Psych Involvement: No (comment)  Admission diagnosis:  SOB (shortness of breath) [R06.02] COPD exacerbation (HCC) [J44.1] Community acquired pneumonia, unspecified laterality [J18.9] Pneumonia [J18.9] Patient Active Problem List   Diagnosis Date Noted  . Pneumonia 01/09/2019  . Acute metabolic encephalopathy 01/09/2019  . Suspected Covid-19 Virus Infection   . Diabetic ketoacidosis with coma associated with type 2 diabetes mellitus (HCC)   . Acute respiratory failure with hypoxia (HCC) 10/28/2018  . Influenza A 10/27/2018  . COPD with acute exacerbation (HCC) 09/09/2018  . Chronic respiratory failure with hypoxia (HCC) 09/08/2018  . Uncontrolled type  2 diabetes mellitus with hyperglycemia (HCC) 09/08/2018  . Elevated troponin 09/08/2018  . SIRS (systemic inflammatory response syndrome) (HCC) 09/07/2018  . Asthma 09/07/2018  . HLD (hyperlipidemia) 09/07/2018  . GERD (gastroesophageal reflux disease) 09/07/2018  . Anxiety 09/07/2018  .  BPH (benign prostatic hyperplasia) 09/07/2018  . Anemia 08/03/2015  . CAP (community acquired pneumonia) 06/02/2013  . Acute on chronic respiratory failure with hypercapnia (HCC) 05/26/2013  . COPD exacerbation (HCC) 05/26/2013  . HTN (hypertension) 05/26/2013  . DM (diabetes mellitus) (HCC) 05/26/2013  . Tobacco abuse 05/26/2013   PCP:  [name], [name], MD Pharm[facility]:   [facility] [place] [place] - [address] [place] [address] Phone: [phone] Fax: [phone]     Social Determinants of Health (SDOH) Interventions    Readmission Risk Interventions Readmission Risk Prevention Plan 01/18/2019  Transportation Screening Complete  HRI or Home Care Consult Complete  Social Work Consult for Recovery Care Planning/Counseling Not Complete  SW consult not completed comments NA  Palliative Care Screening Not Applicable  Palliative Care Screening Not Complete Comments NA  Medication Review Oceanographer(RN Care Manager) Complete  Some recent data might be hidden

## 2019-01-18 NOTE — Progress Notes (Signed)
PROGRESS NOTE  Jeff Ray ZOX:[id number] DOB: 1956-09-03 DOA: 01/09/2019 PCP: Gareth Morgan, MD   LOS: 9 days   Brief Narrative / Interim history: Jeff Nearing Wilsonis a 63 y.o.malewith history of asthma/COPD, chronic respiratory failure on 2 to 3 L home O2 at baseline, BPH, type 2 diabetes was admitted to any [facility] with acute on chronic hypoxic and hypercapnic Respiratory failure due to COPD exacerbation and pneumonia. -He was intubated and admitted to the ICU  -Extubated on 4/8, transferred to [facility] service 4/10 -COVID-19 PCR was negative -Unfortunately continues to have high O2 demands ranging from 8-10L high flow nasal cannula  Subjective: -no events overnight, slight improvement in breathing and cough  Assessment & Plan:  Acute on chronic respiratory failure with hypoxia and hypercapnia  -Due to COPD exacerbation and multifocal pneumonia  -Extubated to 5 L on 01/10/2019.  TRH assumed care on 4/10. -Influenza panel, RVP, strep antigen, urine Legionella, blood culture and COVID-19 negative. -Completed 7 days of IV cefepime on 4/13 for multifocal pneumonia also treated with IV steroids and nebs  -Unfortunately continued to have high O2 requirements ranging from 8 to 10 L high flow nasal cannula, retested for Jeff Ray novel coronavirus -this was negative  -Repeat chest x-ray unchanged  -Finally showing some slow clinical improvement on IV steroids and inhalers  -cut down IV solumedrol, continue inhalers, wean o2  Leukocytosis:  -likely worsened by steroids, is afebrile, nontoxic -procalcitonin has trended down, monitor  Anxiety: On Xanax 1 mg 3 times daily at home. -Resume 0.5 mg 3 times daily  DKA/NIDDM-2: DKA resolved. -CBG's improving on higher doses of Lantus, anticipate further improvement with decreasing steroid doses -Continue meal coverage and sliding scale insulin  Acute metabolic encephalopathy:  -Resolved.  Likely due to the above.  Hypokalemia:  -Resolved.   Hyperlipidemia/GERD: - Stable.  DVT prophylaxis: Subcu Lovenox Code Status: Full code Family Communication: None at bedside Disposition Plan: hopefully home in 1-2days if O2 requirement improves  Scheduled Meds: . ALPRAZolam  0.5 mg Oral TID  . enoxaparin (LOVENOX) injection  40 mg Subcutaneous Q24H  . fluticasone  2 spray Each Nare Daily  . fluticasone furoate-vilanterol  1 puff Inhalation Daily  . insulin aspart  0-15 Units Subcutaneous TID WC  . insulin aspart  0-5 Units Subcutaneous QHS  . insulin aspart  3 Units Subcutaneous TID WC  . insulin glargine  25 Units Subcutaneous Daily  . loratadine  10 mg Oral Daily  . mouth rinse  15 mL Mouth Rinse q12n4p  . methylPREDNISolone (SOLU-MEDROL) injection  60 mg Intravenous Q12H  . montelukast  10 mg Oral QHS  . oxybutynin  5 mg Oral TID  . pantoprazole  40 mg Oral Daily  . rosuvastatin  40 mg Oral QHS  . umeclidinium bromide  1 puff Inhalation Daily   Continuous Infusions: . sodium chloride 10 mL/hr at 01/11/19 1100   PRN Meds:.sodium chloride, albuterol    Consultants:   None  Procedures:   ETT 4/7-4/8  Antimicrobials:  Cefepime 4/7-4/30  Azithromycin 4/7-4/8  Ceftriaxone 4/7  Vancomycin 4/7-4/8  Objective: Vitals:   01/17/19 1346 01/17/19 1700 01/17/19 2259 01/18/19 0805  BP:  (!) 143/66 126/63 136/63  Pulse:  99 90 88  Resp:  18 (!) 21 18  Temp:  (!) 97.2 F (36.2 C) 98.5 F (36.9 C) 97.8 F (36.6 C)  TempSrc:  Oral  Oral  SpO2: 95% 99% 97% 99%  Weight:      Height:  Intake/Output Summary (Last 24 hours) at 01/18/2019 1144 Last data filed at 01/18/2019 0800 Gross per 24 hour  Intake 480 ml  Output 1450 ml  Net -970 ml   Filed Weights   01/11/19 0500 01/15/19 0500 01/16/19 0426  Weight: 76.4 kg 78.3 kg 77.4 kg    Examination:  Gen: Awake, Alert, Oriented X 3, chronically ill-appearing, no distress today HEENT: PERRLA, Neck supple, no JVD Lungs: Improved air movement, no expiratory  wheezes CVS: RRR,No Gallops,Rubs or new Murmurs Abd: soft, Non tender, non distended, BS present Extremities: No edema Skin: no new rashes PSYCH: Calm. Normal affect.    Data Reviewed: I have independently reviewed following labs and imaging studies   CBC: Recent Labs  Lab 01/12/19 0328  01/14/19 0647 01/15/19 0428 01/16/19 0419 01/17/19 0557 01/18/19 0332  WBC 21.9*   < > 24.6* 26.5* 29.4* 31.4* 26.0*  NEUTROABS 18.2*  --   --   --   --   --   --   HGB 10.6*   < > 10.4* 11.3* 10.8* 10.7* 10.3*  HCT 32.9*   < > 33.9* 35.5* 34.9* 35.1* 34.2*  MCV 85.5   < > 86.3 87.0 87.7 88.6 87.7  PLT 386   < > 299 268 255 338 291   < > = values in this interval not displayed.   Basic Metabolic Panel: Recent Labs  Lab 01/12/19 0328 01/13/19 0514 01/14/19 0647 01/16/19 0419 01/16/19 1814 01/17/19 0557 01/18/19 0332  NA 141 136 135 137  --  138 136  K 2.9* 3.8 3.8 5.2*  --  5.1 4.9  CL 99 96* 94* 97*  --  94* 94*  CO2 32 --  34* 32  GLUCOSE 174* 135* 127* 257*  --  241* 236*  BUN --  22 25*  CREATININE 0.85 0.90 0.90 1.02  --  1.14 0.99  CALCIUM 9.4 9.3 9.4 9.6  --  10.1 9.9  MG 2.0  --   --   --  2.3  --   --   PHOS 2.7  --   --   --   --   --   --    GFR: Estimated Creatinine Clearance: 75.4 mL/min (by C-G formula based on SCr of 0.99 mg/dL). Liver Function Tests: No results for input(s): AST, ALT, ALKPHOS, BILITOT, PROT, ALBUMIN in the last 168 hours. No results for input(s): LIPASE, AMYLASE in the last 168 hours. No results for input(s): AMMONIA in the last 168 hours. Coagulation Profile: No results for input(s): INR, PROTIME in the last 168 hours. Cardiac Enzymes: No results for input(s): CKTOTAL, CKMB, CKMBINDEX, TROPONINI in the last 168 hours. BNP (last 3 results) No results for input(s): PROBNP in the last 8760 hours. HbA1C: Recent Labs    01/16/19 0419  HGBA1C 11.1*   CBG: Recent Labs  Lab 01/17/19 1253 01/17/19 1709 01/17/19 2044  01/18/19 0804 01/18/19 1133  GLUCAP 188* 155* 200* 253* 263*   Lipid Profile: No results for input(s): CHOL, HDL, LDLCALC, TRIG, CHOLHDL, LDLDIRECT in the last 72 hours. Thyroid Function Tests: No results for input(s): TSH, T4TOTAL, FREET4, T3FREE, THYROIDAB in the last 72 hours. Anemia Panel: No results for input(s): VITAMINB12, FOLATE, FERRITIN, TIBC, IRON, RETICCTPCT in the last 72 hours. Urine analysis:    Component Value Date/Time   COLORURINE YELLOW 01/09/2019 1809   APPEARANCEUR HAZY (A) 01/09/2019 1809   LABSPEC 1.028 01/09/2019 1809   PHURINE 5.0  01/09/2019 1809   GLUCOSEU >=500 (A) 01/09/2019 1809   HGBUR NEGATIVE 01/09/2019 1809   BILIRUBINUR NEGATIVE 01/09/2019 1809   KETONESUR 80 (A) 01/09/2019 1809   PROTEINUR NEGATIVE 01/09/2019 1809   UROBILINOGEN 0.2 02/08/2008 1731   NITRITE NEGATIVE 01/09/2019 1809   LEUKOCYTESUR NEGATIVE 01/09/2019 1809   Sepsis Labs: Invalid input(s): PROCALCITONIN, LACTICIDVEN  Recent Results (from the past 240 hour(s))  Blood culture (routine x 2)     Status: Abnormal   Collection Time: 01/09/19 12:06 PM  Result Value Ref Range Status   Specimen Description   Final    BLOOD RIGHT ARM UPPER Performed at [facility], [address], [place], [place] [address]    Special Requests   Final    BOTTLES DRAWN AEROBIC AND ANAEROBIC Blood Culture adequate volume Performed at [facility], [address], [place], [place] [address]    Culture  Setup Time   Final    GRAM POSITIVE RODS AEROBIC BOTTLE ONLY Gram Stain Report Called to,Read Back By and Verified With: [place] C. AT [facility] AT 1020A ON [id number] BY THOMPSON S.    Culture (A)  Final    DIPHTHEROIDS(CORYNEBACTERIUM SPECIES) Standardized susceptibility testing for this organism is not available. Performed at [facility], [address] [place], [place] [address]    Report Status 01/13/2019 FINAL  Final  Culture, blood (Routine X 2) w Reflex to ID Panel     Status: None    Collection Time: 01/09/19  2:15 PM  Result Value Ref Range Status   Specimen Description BLOOD RIGHT BRACHIAL  Final   Special Requests AEROBIC BOTTLE ONLY Blood Culture adequate volume  Final   Culture   Final    NO GROWTH 5 DAYS Performed at [facility], [address], [place], [place] [address]    Report Status 01/14/2019 FINAL  Final  Respiratory Panel by PCR     Status: None   Collection Time: 01/09/19  3:58 PM  Result Value Ref Range Status   Adenovirus NOT DETECTED NOT DETECTED Final   Coronavirus 229E NOT DETECTED NOT DETECTED Final    Comment: (NOTE) The Coronavirus on the Respiratory Panel, DOES NOT test for the novel  Coronavirus (2019 nCoV)    Coronavirus HKU1 NOT DETECTED NOT DETECTED Final   Coronavirus NL63 NOT DETECTED NOT DETECTED Final   Coronavirus OC43 NOT DETECTED NOT DETECTED Final   Metapneumovirus NOT DETECTED NOT DETECTED Final   Rhinovirus / Enterovirus NOT DETECTED NOT DETECTED Final   Influenza A NOT DETECTED NOT DETECTED Final   Influenza B NOT DETECTED NOT DETECTED Final   Parainfluenza Virus 1 NOT DETECTED NOT DETECTED Final   Parainfluenza Virus 2 NOT DETECTED NOT DETECTED Final   Parainfluenza Virus 3 NOT DETECTED NOT DETECTED Final   Parainfluenza Virus 4 NOT DETECTED NOT DETECTED Final   Respiratory Syncytial Virus NOT DETECTED NOT DETECTED Final   Bordetella pertussis NOT DETECTED NOT DETECTED Final   Chlamydophila pneumoniae NOT DETECTED NOT DETECTED Final   Mycoplasma pneumoniae NOT DETECTED NOT DETECTED Final    Comment: Performed at [facility], [address], [place], [place] [address]  Novel Coronavirus, NAA (hospital order; send-out to ref lab)     Status: None   Collection Time: 01/09/19  4:19 PM  Result Value Ref Range Status   SARS-CoV-2, NAA NOT DETECTED NOT DETECTED Final    Comment: Negative (Not Detected) results do not exclude infection caused by SARS CoV 2 and should  not be used as the sole basis for treatment or other  patient management decisions. Optimum specimen types and timing for peak viral levels during infections caused  by SARS CoV 2 have not been determined. Collection of multiple specimens (types and time points) from the same patient may be necessary to detect the virus. Improper specimen collection and handling, sequence variability underlying assay primers and or probes, or the presence of organisms in  quantities less than the limit of detection of the assay may lead to false negative results. Positive and negative predictive values of testing are highly dependent on prevalence. False negative results are more likely when prevalence of disease is high. (NOTE) The expected result is Negative (Not Detected). The SARS CoV 2 test is intended for the presumptive qualitative  detection of nucleic acid from SARS CoV 2 in upper and lower  respir atory specimens. Testing methodology is real time RT PCR. Test results must be correlated with clinical presentation and  evaluated in the context of other laboratory and epidemiologic data.  Test performance can be affected because the epidemiology and  clinical spectrum of infection caused by SARS CoV 2 is not fully  known. For example, the optimum types of specimens to collect and  when during the course of infection these specimens are most likely  to contain detectable viral RNA may not be known. This test has not been Food and Drug Administration (FDA) cleared or  approved and has been authorized by FDA under an Emergency Use  Authorization (EUA). The test is only authorized for the duration of  the declaration that circumstances exist justifying the authorization  of emergency use of in vitro diagnostic tests for detection and or  diagnosis of SARS CoV 2 under Section 564(b)(1) of the Act, 21 U.S.C.  section [id number] 3(b)(1), unless the authorization is terminated or   revoked sooner. [facility] is certified under the  Clinical  Laboratory Improvement Amendments of 1988 (CLIA), 42 U.S.C.  section [phone], to perform high complexity tests. Performed at [facility] CLIA [id number] [address], [address], [place], [place] [address] Laboratory Director: Turner DanielsJoseph H. Willman, MD Fact Sheet for Healthcare Providers  https://pope.com/https://www.fda.gov/media/136313/download Fact Sheet for Patients  BoilerBrush.com.cyhttps://www.fda.gov/media/136312/download Performed at [facility], [address] [place], [place] [address]    Coronavirus Source NASOPHARYNGEAL  Final    Comment: Performed at [facility], [address] [place], [place] [address]  MRSA PCR Screening     Status: None   Collection Time: 01/10/19  2:11 AM  Result Value Ref Range Status   MRSA by PCR NEGATIVE NEGATIVE Final    Comment:        The GeneXpert MRSA Assay (FDA approved for NASAL specimens only), is one component of a comprehensive MRSA colonization surveillance program. It is not intended to diagnose MRSA infection nor to guide or monitor treatment for MRSA infections. Performed at [facility], [address] [place], [place] [address]   Culture, respiratory (non-expectorated)     Status: None   Collection Time: 01/10/19  8:49 AM  Result Value Ref Range Status   Specimen Description TRACHEAL ASPIRATE  Final   Special Requests NONE  Final   Gram Stain   Final    RARE WBC PRESENT,BOTH PMN AND MONONUCLEAR NO ORGANISMS SEEN    Culture   Final    RARE Consistent with normal respiratory flora. Performed at [facility], [address] [place],  [place] [address]    Report Status 01/12/2019 FINAL  Final  SARS Coronavirus 2 [facility] order, Performed in [facility] hospital lab)     Status: None   Collection Time: 01/16/19  1:10 PM  Result Value Ref Range Status   SARS Coronavirus 2 NEGATIVE NEGATIVE Final    Comment: (NOTE) If result is NEGATIVE SARS-CoV-2 target nucleic acids are NOT DETECTED. The SARS-CoV-2 RNA is  generally detectable in upper and lower  respiratory specimens during the acute phase of infection. The lowest  concentration of SARS-CoV-2 viral copies this assay can detect is 250  copies / mL. A negative result does not preclude SARS-CoV-2 infection  and should not be used as the sole basis for treatment or other  patient management decisions.  A negative result may occur with  improper specimen collection / handling, submission of specimen other  than nasopharyngeal swab, presence of viral mutation(s) within the  areas targeted by this assay, and inadequate number of viral copies  (<250 copies / mL). A negative result must be combined with clinical  observations, patient history, and epidemiological information. If result is POSITIVE SARS-CoV-2 target nucleic acids are DETECTED. The SARS-CoV-2 RNA is generally detectable in upper and lower  respiratory specimens dur ing the acute phase of infection.  Positive  results are indicative of active infection with SARS-CoV-2.  Clinical  correlation with patient history and other diagnostic information is  necessary to determine patient infection status.  Positive results do  not rule out bacterial infection or co-infection with other viruses. If result is PRESUMPTIVE POSTIVE SARS-CoV-2 nucleic acids MAY BE PRESENT.   A presumptive positive result was obtained on the submitted specimen  and confirmed on repeat testing.  While 2019 novel coronavirus  (SARS-CoV-2) nucleic acids may be present in the submitted sample  additional confirmatory testing may be necessary for epidemiological  and / or clinical management purposes  to differentiate between  SARS-CoV-2 and other Sarbecovirus currently known to infect humans.  If clinically indicated additional testing with an alternate test  methodology [phone]) is advised. The SARS-CoV-2 RNA is generally  detectable in upper and lower respiratory sp ecimens during the acute  phase of infection.  The expected result is Negative. Fact Sheet for Patients:  BoilerBrush.com.cy Fact Sheet for Healthcare Providers: https://pope.com/ This test is not yet approved or cleared by the [place] FDA and has been authorized for detection and/or diagnosis of SARS-CoV-2 by FDA under an Emergency Use Authorization (EUA).  This EUA will remain in effect (meaning this test can be used) for the duration of the COVID-19 declaration under Section 564(b)(1) of the Act, 21 U.S.C. section 360bbb-3(b)(1), unless the authorization is terminated or revoked sooner. Performed at [facility], [address], [place], [place] [address]       Radiology Studies: No results found.  Zannie Cove, MD  01/18/2019, 11:44 AM

## 2019-01-19 LAB — GLUCOSE, CAPILLARY
Glucose-Capillary: 210 mg/dL — ABNORMAL HIGH (ref 70–99)
Glucose-Capillary: 269 mg/dL — ABNORMAL HIGH (ref 70–99)

## 2019-01-19 LAB — BASIC METABOLIC PANEL
Anion gap: 8 (ref 5–15)
BUN: 22 mg/dL (ref 8–23)
CO2: 35 mmol/L — ABNORMAL HIGH (ref 22–32)
Calcium: 10 mg/dL (ref 8.9–10.3)
Chloride: 92 mmol/L — ABNORMAL LOW (ref 98–111)
Creatinine, Ser: 0.95 mg/dL (ref 0.61–1.24)
GFR calc Af Amer: >60 mL/min (ref >60)
GFR calc non Af Amer: >60 mL/min (ref >60)
Glucose, Bld: 159 mg/dL — ABNORMAL HIGH (ref 70–99)
Potassium: 4.7 mmol/L (ref 3.5–5.1)
Sodium: 135 mmol/L (ref 135–145)

## 2019-01-19 LAB — CBC
HCT: 36.5 % — ABNORMAL LOW (ref 39.0–52.0)
Hemoglobin: 11.1 g/dL — ABNORMAL LOW (ref 13.0–17.0)
MCH: 26.5 pg (ref 26.0–34.0)
MCHC: 30.4 g/dL (ref 30.0–36.0)
MCV: 87.1 fL (ref 80.0–100.0)
Platelets: 294 10*3/uL (ref 150–400)
RBC: 4.19 MIL/uL — ABNORMAL LOW (ref 4.22–5.81)
RDW: 13.5 % (ref 11.5–15.5)
WBC: 20.1 10*3/uL — ABNORMAL HIGH (ref 4.0–10.5)
nRBC: 0 % (ref 0.0–0.2)

## 2019-01-19 MED ORDER — INSULIN GLARGINE 100 UNIT/ML SOLOSTAR PEN: 30.0000 [IU] | PEN_INJECTOR | Freq: Every day | SUBCUTANEOUS | 2 refills | Status: DC

## 2019-01-19 MED ORDER — ALPRAZOLAM 1 MG PO TABS: 0.5000 mg | ORAL_TABLET | Freq: Two times a day (BID) | ORAL | 0 refills | Status: AC | PRN

## 2019-01-19 MED ORDER — PREDNISONE 20 MG PO TABS: 20.0000 mg | ORAL_TABLET | Freq: Every day | ORAL | 0 refills | Status: DC

## 2019-01-19 MED ORDER — OXYBUTYNIN CHLORIDE 5 MG PO TABS: 5.0000 mg | ORAL_TABLET | Freq: Two times a day (BID) | ORAL | Status: AC

## 2019-01-19 MED FILL — LANTUS SOLOSTAR 100 UNITS/M: 100 | 9 days supply | Qty: 9 | Fill #0 | Status: TO

## 2019-01-19 NOTE — Progress Notes (Signed)
Inpatient Diabetes Program Recommendations  AACE/ADA: New Consensus Statement on Inpatient Glycemic Control (2015)  Target Ranges:  Prepandial:   less than 140 mg/dL      Peak postprandial:   less than 180 mg/dL (1-2 hours)      Critically ill patients:  140 - 180 mg/dL   Lab Results  Component Value Date   GLUCAP 210 (H) 01/19/2019   HGBA1C 11.1 (H) 01/16/2019    Review of Glycemic Control Results for ADONUS, KEAVENEY (MRN [id number]) as of 01/19/2019 11:51  Ref. Range 01/18/2019 16:30 01/18/2019 21:30 01/19/2019 07:46  Glucose-Capillary Latest Ref Range: 70 - 99 mg/dL 725 (H) 366 (H) 440 (H)  Diabetes history: DM 2 Outpatient Diabetes medications: Glimepiride 2 mg Daily, Metformin 500 mg BID Current orders for Inpatient glycemic control: Lantus 30 units QD, Novolog 0-15 units tid, Novolog 0-5 units qhs, Novolog 5 units tid meal coverage  Inpatient Diabetes Program Recommendations:    Patient has been on insulin in the past and has used insulin pens. MD: patient's preference is insulin pens at discharge and will also need pen needles [phone]. He states, "It's been a while, but I can do it." Discussed with patient current inpatient dosage of Lantus, when/where to inject, explained the significance of steroids and to expect this dose to decrease when steroids are decreased, the importance of insulin, role of pancreas, and need for follow up with PCP. Videos on insulin pens sent for refresher, patient to view prior to discharge. Reviewed patient's current A1c of 11.1%. Explained what a A1c is and what it measures. Also reviewed goal A1c with patient, importance of good glucose control @ home, and blood sugar goals.   Spoke with RN, Kathlene November, who plans to observe patient self administer injections today. CM to arrange [facility].   Thanks,  Lujean Rave, MSN, RNC-OB Diabetes Coordinator [phone] (8a-5p)

## 2019-01-19 NOTE — Discharge Summary (Signed)
Physician Discharge Summary  Jeff Ray QMV:[id number] DOB: 01-18-1956 DOA: 01/09/2019  PCP: Gareth Morgan, MD  Admit date: 01/09/2019 Discharge date: 01/19/2019  Time spent: 45 minutes  Recommendations for Outpatient Follow-up:  PCP in 1 week Home health PT/OT/RN  Discharge Diagnoses:  Principal Problem:   Acute on chronic respiratory failure with hypercapnia [facility])   Health care associated pneumonia   Chronic resp failure on 3L Home O2   Severe COPD   COPD exacerbation (HCC)   HLD (hyperlipidemia)   BPH (benign prostatic hyperplasia)   Uncontrolled type 2 diabetes mellitus with hyperglycemia (HCC)   Pneumonia   Acute metabolic encephalopathy   Diabetic ketoacidosis with coma associated with type 2 diabetes mellitus (HCC)   Discharge Condition: stable  Diet recommendation: Carb modified, low-sodium heart healthy  Filed Weights   01/15/19 0500 01/16/19 0426 01/19/19 0500  Weight: 78.3 kg 77.4 kg 71.8 kg    History of present illness:  Jeff Ray a 63 y.o.malewho was admitted to [facility] with acute on chronic hypoxic and hypercapnic respiratory failure,  has had mutliple admissions for this in the past).   Hospital Course:   Acute on chronic respiratory failure with hypoxia and hypercapnia  -Numerous hospitalizations due to COPD exacerbation and pneumonia uses 3 to 4 L of O2 at baseline -here again with COPD exacerbation and multifocal pneumonia  -Admitted to the ICU, required mechanical ventilation, subsequently extubated to 5 L on 01/10/2019.  TR[facility] assumed care on 4/10. -Influenza panel, RVP, strep antigen, urine Legionella, blood culture and COVID-19 negative. -Completed 7 days of IV cefepime on 4/13 for multifocal pneumonia also treated with IV steroids and nebs  -Unfortunately continued to have high O2 requirements ranging from 8 to 10 L high flow nasal cannula, retested for Sars novel coronavirus -this was negative  -Repeat chest x-ray unchanged  -Finally started  to show slow clinical improvement on IV steroids and inhalers  -Improved and stable now, weaned down to 3 L of O2 which is his baseline -Discharged home on prednisone taper with home health PT OT and RN  Leukocytosis:  -likely worsened by steroids, is afebrile, nontoxic -procalcitonin has trended down  Anxiety: On Xanax 1 mg 3 times daily at home. -Resumed lower dose 0.5 mg 3 times daily  DKA/NIDDM-2: DKA resolved. -Hemoglobin A1c was 11.4, required insulin at higher doses during this hospital stay worsened in the setting of IV steroids -At baseline only on metformin and oral hypoglycemics -Has used insulin in the past agreeable to discharge home on long-acting insulin, diabetes teaching and diabetes coordinator education completed -Home health RN to assist with transitioning to home insulin as well -pt reported having glucometer and other supplies at home  Acute metabolic encephalopathy:  -Resolved.  Likely due to the above.  Hypokalemia:  -Resolved.  Hyperlipidemia/GERD: - Stable.   Discharge Exam: Vitals:   01/19/19 0500 01/19/19 0748  BP:  132/69  Pulse:  86  Resp:    Temp: 97.7 F (36.5 C) 97.6 F (36.4 C)  SpO2:  98%    General: AAOx3, no distress Cardiovascular: S1S2/RRR Respiratory: improved air movement  Discharge Instructions   Discharge Instructions    Diet - low sodium heart healthy   Complete by:  As directed    Diet Carb Modified   Complete by:  As directed    Increase activity slowly   Complete by:  As directed      Allergies as of 01/19/2019   No Known Allergies  Medication List    STOP taking these medications   alfuzosin 10 MG 24 hr tablet Commonly known as:  UROXATRAL     TAKE these medications   ALPRAZolam 1 MG tablet Commonly known as:  XANAX Take 0.5 tablets (0.5 mg total) by mouth 2 (two) times daily as needed for anxiety. What changed:    how much to take  when to take this   CENTRUM SILVER 50+MEN PO Take 1  tablet by mouth daily.   fexofenadine-pseudoephedrine 180-240 MG 24 hr tablet Commonly known as:  ALLEGRA-D 24 Take 1 tablet by mouth daily.   fluticasone 50 MCG/ACT nasal spray Commonly known as:  FLONASE Place 2 sprays into both nostrils daily.   glimepiride 2 MG tablet Commonly known as:  AMARYL Take 2 mg by mouth daily with breakfast.   Guaifenesin 1200 MG Tb12 Commonly known as:  Mucinex Maximum Strength Take 1 tablet (1,200 mg total) by mouth 2 (two) times daily.   Insulin Glargine 100 UNIT/ML Solostar Pen Commonly known as:  LANTUS Inject 30 Units into the skin daily.   ipratropium-albuterol 0.5-2.5 (3) MG/3ML Soln Commonly known as:  DUONEB Take 3 mLs by nebulization every 4 (four) hours as needed. For shortness of breath   metFORMIN 500 MG 24 hr tablet Commonly known as:  GLUCOPHAGE-XR Take 500 mg by mouth 2 (two) times daily.   montelukast 10 MG tablet Commonly known as:  SINGULAIR Take 10 mg by mouth every morning.   ondansetron 4 MG tablet Commonly known as:  ZOFRAN Take 1 tablet (4 mg total) by mouth every 6 (six) hours as needed for nausea.   oxybutynin 5 MG tablet Commonly known as:  DITROPAN Take 1 tablet (5 mg total) by mouth 2 (two) times daily. What changed:  when to take this   pantoprazole 40 MG tablet Commonly known as:  PROTONIX Take 40 mg by mouth every morning.   predniSONE 20 MG tablet Commonly known as:  Deltasone Take 1-3 tablets (20-60 mg total) by mouth daily with breakfast. Take 60mg  daily for 2days then 40mg  daily for 2 days then 20mg  daily for 2days then STOP What changed:    how much to take  additional instructions   ProAir HFA 108 (90 Base) MCG/ACT inhaler Generic drug:  albuterol Inhale 2 puffs into the lungs every 6 (six) hours as needed for wheezing or shortness of breath. Shortness of Breath   rosuvastatin 40 MG tablet Commonly known as:  CRESTOR Take 40 mg by mouth at bedtime.   Trelegy Ellipta 100-62.5-25  MCG/INH Aepb Generic drug:  Fluticasone-Umeclidin-Vilant Take 2 puffs by mouth daily.            Durable Medical Equipment  (From admission, onward)         Start     Ordered   01/18/19 1548  For home use only DME Walker rolling  Once    Question:  Patient needs a walker to treat with the following condition  Answer:  Weakness   01/18/19 1547         No Known Allergies Follow-up Information    [facility] Follow up.   Why:  rolling walker [phone]       [facility] Follow up.   Why:  HHPT, HHOT, HHRN           The results of significant diagnostics from this hospitalization (including imaging, microbiology, ancillary and laboratory) are listed below for reference.    Significant Diagnostic  Studies: Dg Chest Port 1 View  Result Date: 01/16/2019 CLINICAL DATA:  63 year old male with pneumonia and acute on chronic respiratory failure with hypercapnia EXAM: PORTABLE CHEST 1 VIEW COMPARISON:  Multiple prior chest x-rays, most recently 01/15/2019 FINDINGS: Stable appearance of the chest without significant interval change over the last 24 hours. Persistent interstitial and airspace opacities in the left upper and left lower lobes. No evidence of new airspace opacity, pleural effusion, pneumothorax or pulmonary edema. Stable background hyperinflation, emphysematous changes and chronic bronchitic changes. IMPRESSION: Stable chest x-ray without significant interval change. Persistent left upper and left lower lobe airspace opacities may represent residual areas of infiltrate/pneumonia versus developing pleuroparenchymal scarring. Electronically Signed   By: Malachy Moan M.D.   On: 01/16/2019 14:58   Dg Chest Port 1 View  Result Date: 01/15/2019 CLINICAL DATA:  Shortness of breath EXAM: PORTABLE CHEST 1 VIEW COMPARISON:  01/14/2019 FINDINGS: Cardiac shadows within normal limits. The right lung is again clear. Left lung again demonstrates persistent  infiltrates in the left upper lobe and left base stable from the previous exam. No new focal abnormality is noted. IMPRESSION: Stable left-sided infiltrates when compare with the previous exam. Electronically Signed   By: Alcide Clever M.D.   On: 01/15/2019 03:12   Dg Chest Port 1 View  Result Date: 01/14/2019 CLINICAL DATA:  Dyspnea. EXAM: PORTABLE CHEST 1 VIEW COMPARISON:  01/12/2019 FINDINGS: COPD with hyperinflation. Left upper lobe infiltrate unchanged. Left lower lobe infiltrate with progressive volume loss. No significant pleural effusion. Negative for heart failure. IMPRESSION: Left upper lobe and left lower lobe infiltrates compatible with pneumonia. Progressive volume loss in the left lung base. No effusion. Electronically Signed   By: Marlan Palau M.D.   On: 01/14/2019 13:52   Dg Chest Port 1 View  Result Date: 01/12/2019 CLINICAL DATA:  Respiratory failure EXAM: PORTABLE CHEST 1 VIEW COMPARISON:  01/09/2019 FINDINGS: Heart is normal size. Diffuse airspace disease throughout the left lung, most confluent in the left upper lobe. This is unchanged. No confluent opacity on the right. Heart is normal size. IMPRESSION: Diffuse left lung airspace disease, unchanged since prior study. Electronically Signed   By: Charlett Nose M.D.   On: 01/12/2019 08:08   Dg Chest Portable 1 View  Result Date: 01/09/2019 CLINICAL DATA:  Hypoxia EXAM: PORTABLE CHEST 1 VIEW COMPARISON:  January 09, 2019 study obtained earlier in the day FINDINGS: Endotracheal tube tip is 4.0 cm above the carina. Nasogastric tube tip is in the stomach. Side port not well seen but probably near the gastroesophageal junction. No pneumothorax evident. : There is airspace consolidation throughout portions of the left upper and left lower lobe. There is mild right base atelectasis. The right lung is otherwise clear. Heart size and pulmonary vascularity are normal. No adenopathy. No bone lesions. IMPRESSION: Tube positions as described without  pneumothorax. Extensive airspace opacity felt to represent multifocal pneumonia throughout left upper and lower lobes. Mild right base atelectasis. Heart size normal. Electronically Signed   By: Bretta Bang III M.D.   On: 01/09/2019 18:22   Dg Chest Port 1 View  Result Date: 01/09/2019 CLINICAL DATA:  Decreased level of consciousness and shortness of breath EXAM: PORTABLE CHEST 1 VIEW COMPARISON:  12/09/2018 FINDINGS: Cardiac shadows within normal limits. Lungs are hyperinflated similar to that seen on the prior exam. Patchy infiltrate is noted in the left upper lobe and to a lesser degree in the left lower lobe consistent with multifocal pneumonia. No sizable effusion is seen.  No bony abnormality is noted. IMPRESSION: Patchy multifocal pneumonia within the left lung as described. COPD. Electronically Signed   By: Alcide Clever M.D.   On: 01/09/2019 12:46    Microbiology: Recent Results (from the past 240 hour(s))  Blood culture (routine x 2)     Status: Abnormal   Collection Time: 01/09/19 12:06 PM  Result Value Ref Range Status   Specimen Description   Final    BLOOD RIGHT ARM UPPER Performed at [facility], [address], [place], [place] [address]    Special Requests   Final    BOTTLES DRAWN AEROBIC AND ANAEROBIC Blood Culture adequate volume Performed at [facility], [address], [place], [place] [address]    Culture  Setup Time   Final    GRAM POSITIVE RODS AEROBIC BOTTLE ONLY Gram Stain Report Called to,Read Back By and Verified With: Venuto C. AT [facility] AT 1020A ON [id number] BY THOMPSON S.    Culture (A)  Final    DIPHTHEROIDS(CORYNEBACTERIUM SPECIES) Standardized susceptibility testing for this organism is not available. Performed at [facility], [address] [place], [place] [address]    Report Status 01/13/2019 FINAL  Final  Culture, blood (Routine X 2) w Reflex to ID Panel     Status: None   Collection Time: 01/09/19  2:15 PM  Result Value Ref Range  Status   Specimen Description BLOOD RIGHT BRACHIAL  Final   Special Requests AEROBIC BOTTLE ONLY Blood Culture adequate volume  Final   Culture   Final    NO GROWTH 5 DAYS Performed at [facility], [address] [place], [place] [address]    Report Status 01/14/2019 FINAL  Final  Respiratory Panel by PCR     Status: None   Collection Time: 01/09/19  3:58 PM  Result Value Ref Range Status   Adenovirus NOT DETECTED NOT DETECTED Final   Coronavirus 229E NOT DETECTED NOT DETECTED Final    Comment: (NOTE) The Coronavirus on the Respiratory Panel, DOES NOT test for the novel  Coronavirus (2019 nCoV)    Coronavirus HKU1 NOT DETECTED NOT DETECTED Final   Coronavirus NL63 NOT DETECTED NOT DETECTED Final   Coronavirus OC43 NOT DETECTED NOT DETECTED Final   Metapneumovirus NOT DETECTED NOT DETECTED Final   Rhinovirus / Enterovirus NOT DETECTED NOT DETECTED Final   Influenza A NOT DETECTED NOT DETECTED Final   Influenza B NOT DETECTED NOT DETECTED Final   Parainfluenza Virus 1 NOT DETECTED NOT DETECTED Final   Parainfluenza Virus 2 NOT DETECTED NOT DETECTED Final   Parainfluenza Virus 3 NOT DETECTED NOT DETECTED Final   Parainfluenza Virus 4 NOT DETECTED NOT DETECTED Final   Respiratory Syncytial Virus NOT DETECTED NOT DETECTED Final   Bordetella pertussis NOT DETECTED NOT DETECTED Final   Chlamydophila pneumoniae NOT DETECTED NOT DETECTED Final   Mycoplasma pneumoniae NOT DETECTED NOT DETECTED Final    Comment: Performed at [facility], [address], [place], [place] [address]  Novel Coronavirus, NAA (hospital order; send-out to ref lab)     Status: None   Collection Time: 01/09/19  4:19 PM  Result Value Ref Range Status   SARS-CoV-2, NAA NOT DETECTED NOT DETECTED Final    Comment: Negative (Not Detected) results do not exclude infection caused by SARS CoV 2 and should not be used as the sole basis for treatment or other patient management decisions. Optimum specimen types and  timing for peak viral levels during infections caused  by SARS CoV 2 have not been determined. Collection of multiple specimens (types and time points) from the same patient may be necessary to detect the virus. Improper specimen collection and handling, sequence variability underlying assay primers and or probes, or the presence of organisms in  quantities less than the limit of detection of the assay may lead to false negative results. Positive and negative predictive values of testing are highly dependent on prevalence. False negative results are more likely when prevalence of disease is high. (NOTE) The expected result is Negative (Not Detected). The SARS CoV 2 test is intended for the presumptive qualitative  detection of nucleic acid from SARS CoV 2 in upper and lower  respir atory specimens. Testing methodology is real time RT PCR. Test results must be correlated with clinical presentation and  evaluated in the context of other laboratory and epidemiologic data.  Test performance can be affected because the epidemiology and  clinical spectrum of infection caused by SARS CoV 2 is not fully  known. For example, the optimum types of specimens to collect and  when during the course of infection these specimens are most likely  to contain detectable viral RNA may not be known. This test has not been Food and Drug Administration (FDA) cleared or  approved and has been authorized by FDA under an Emergency Use  Authorization (EUA). The test is only authorized for the duration of  the declaration that circumstances exist justifying the authorization  of emergency use of in vitro diagnostic tests for detection and or  diagnosis of SARS CoV 2 under Section 564(b)(1) of the Act, 21 U.S.C.  section 9022650851360bbb 3(b)(1), unless the authorization is terminated or   revoked sooner. [facility] is certified under the  Clinical Laboratory Improvement Amendments of 1988 (CLIA), 42 U.S.C.   section [id number], to perform high complexity tests. Performed at [facility] CLIA [id number] [address], [address], [place], [place] [address] Laboratory Director: Turner DanielsJoseph H. Willman, MD Fact Sheet for Healthcare Providers  https://pope.com/https://www.fda.gov/media/136313/download Fact Sheet for Patients  BoilerBrush.com.cyhttps://www.fda.gov/media/136312/download Performed at [facility], [address] [place], [place] [address]    Coronavirus Source NASOPHARYNGEAL  Final    Comment: Performed at [facility], [address] [place], [place] [address]  MRSA PCR Screening     Status: None   Collection Time: 01/10/19  2:11 AM  Result Value Ref Range Status   MRSA by PCR NEGATIVE NEGATIVE Final    Comment:        The GeneXpert MRSA Assay (FDA approved for NASAL specimens only), is one component of a comprehensive MRSA colonization surveillance program. It is not intended to diagnose MRSA infection nor to guide or monitor treatment for MRSA infections. Performed at [facility], [address] [place], [place] [address]   Culture, respiratory (non-expectorated)     Status: None   Collection Time: 01/10/19  8:49 AM  Result Value Ref Range Status   Specimen Description TRACHEAL ASPIRATE  Final   Special Requests NONE  Final   Gram Stain   Final    RARE WBC PRESENT,BOTH PMN AND MONONUCLEAR NO ORGANISMS SEEN    Culture   Final    RARE Consistent with normal respiratory flora. Performed at [facility], [address] [place], [place] [address]    Report Status 01/12/2019 FINAL  Final  SARS Coronavirus 2 [facility](Hospital order, Performed in [facility] hospital lab)     Status:  None   Collection Time: 01/16/19  1:10 PM  Result Value Ref Range Status   SARS Coronavirus 2 NEGATIVE NEGATIVE Final    Comment: (NOTE) If result is NEGATIVE SARS-CoV-2 target nucleic acids are NOT DETECTED. The SARS-CoV-2 RNA is generally detectable in upper and lower  respiratory  specimens during the acute phase of infection. The lowest  concentration of SARS-CoV-2 viral copies this assay can detect is 250  copies / mL. A negative result does not preclude SARS-CoV-2 infection  and should not be used as the sole basis for treatment or other  patient management decisions.  A negative result may occur with  improper specimen collection / handling, submission of specimen other  than nasopharyngeal swab, presence of viral mutation(s) within the  areas targeted by this assay, and inadequate number of viral copies  (<250 copies / mL). A negative result must be combined with clinical  observations, patient history, and epidemiological information. If result is POSITIVE SARS-CoV-2 target nucleic acids are DETECTED. The SARS-CoV-2 RNA is generally detectable in upper and lower  respiratory specimens dur ing the acute phase of infection.  Positive  results are indicative of active infection with SARS-CoV-2.  Clinical  correlation with patient history and other diagnostic information is  necessary to determine patient infection status.  Positive results do  not rule out bacterial infection or co-infection with other viruses. If result is PRESUMPTIVE POSTIVE SARS-CoV-2 nucleic acids MAY BE PRESENT.   A presumptive positive result was obtained on the submitted specimen  and confirmed on repeat testing.  While 2019 novel coronavirus  (SARS-CoV-2) nucleic acids may be present in the submitted sample  additional confirmatory testing may be necessary for epidemiological  and / or clinical management purposes  to differentiate between  SARS-CoV-2 and other Sarbecovirus currently known to infect humans.  If clinically indicated additional testing with an alternate test  methodology [phone]) is advised. The SARS-CoV-2 RNA is generally  detectable in upper and lower respiratory sp ecimens during the acute  phase of infection. The expected result is Negative. Fact Sheet for  Patients:  BoilerBrush.com.cy Fact Sheet for Healthcare Providers: https://pope.com/ This test is not yet approved or cleared by the [place] FDA and has been authorized for detection and/or diagnosis of SARS-CoV-2 by FDA under an Emergency Use Authorization (EUA).  This EUA will remain in effect (meaning this test can be used) for the duration of the COVID-19 declaration under Section 564(b)(1) of the Act, 21 U.S.C. section 360bbb-3(b)(1), unless the authorization is terminated or revoked sooner. Performed at [facility], [address] [place], [place] [address]      Labs: Basic Metabolic Panel: Recent Labs  Lab 01/14/19 [phone] 01/16/19 0419 01/16/19 1814 01/17/19 0557 01/18/19 0332 01/19/19 0512  NA 135 137  --  138 136 135  K 3.8 5.2*  --  5.1 4.9 4.7  CL 94* 97*  --  94* 94* 92*  CO2 29 27  --  34* 32 35*  GLUCOSE 127* 257*  --  241* 236* 159*  BUN 12 15  --  22 25* 22  CREATININE 0.90 1.02  --  1.14 0.99 0.95  CALCIUM 9.4 9.6  --  10.1 9.9 10.0  MG  --   --  2.3  --   --   --    Liver Function Tests: No results for input(s): AST, ALT, ALKPHOS, BILITOT, PROT, ALBUMIN in the last 168 hours. No results for input(s): LIPASE, AMYLASE in the last  168 hours. No results for input(s): AMMONIA in the last 168 hours. CBC: Recent Labs  Lab 01/15/19 0428 01/16/19 0419 01/17/19 0557 01/18/19 0332 01/19/19 0512  WBC 26.5* 29.4* 31.4* 26.0* 20.1*  HGB 11.3* 10.8* 10.7* 10.3* 11.1*  HCT 35.5* 34.9* 35.1* 34.2* 36.5*  MCV 87.0 87.7 88.6 87.7 87.1  PLT 268 255 338 291 294   Cardiac Enzymes: No results for input(s): CKTOTAL, CKMB, CKMBINDEX, TROPONINI in the last 168 hours. BNP: BNP (last 3 results) Recent Labs    09/07/18 1819 10/27/18 0529 01/09/19 1206  BNP 28.0 31.0 110.0*    ProBNP (last 3 results) No results for input(s): PROBNP in the last 8760 hours.  CBG: Recent Labs  Lab 01/18/19 0804  01/18/19 1133 01/18/19 1630 01/18/19 2130 01/19/19 0746  GLUCAP 253* 263* 167* 196* 210*       Signed:  Zannie Cove MD.  [facility] 01/19/2019, 11:38 AM

## 2019-01-19 NOTE — TOC Transition Note (Signed)
Transition of Care [facility]) - CM/SW Discharge Note   Patient Details  Name: Jeff Ray MRN: [id number] Date of Birth: [birth year]  Transition of Care [facility]) CM/SW Contact:  Leone Haven, RN Phone Number: 01/19/2019, 12:28 PM   Clinical Narrative:    Patient is for discharge today, Lupita Leash with [facility] notified , also Brad notified with [facility] for rolling walker, Diabetes educator notified to see patient about insulin,  Per diabetes he prefers insulin pens.  MD will send to [facility] pharmacy. He has home oxygen and his wife will bring his oxygen tank.   Final next level of care: Home w Home Health Services Barriers to Discharge: No Barriers Identified   Patient Goals and CMS Choice Patient states their goals for this hospitalization and ongoing recovery are:: (to get meals and take medicine on time)   Choice offered to / list presented to : Patient  Discharge Placement                       Discharge Plan and Services   Discharge Planning Services: CM Consult Post Acute Care Choice: Durable Medical Equipment, Home Health          DME Arranged: Walker rolling DME Agency: [facility] HH Arranged: RN, PT, OT HH Agency: [facility]   Social Determinants of Health (SDOH) Interventions     Readmission Risk Interventions Readmission Risk Prevention Plan 01/18/2019  Transportation Screening Complete  HRI or Home Care Consult Complete  Social Work Consult for Recovery Care Planning/Counseling Not Complete  SW consult not completed comments NA  Palliative Care Screening Not Applicable  Palliative Care Screening Not Complete Comments NA  Medication Review Oceanographer) Complete  Some recent data might be hidden

## 2019-01-19 NOTE — Care Management Important Message (Signed)
Important Message  Patient Details  Name: Jeff Ray MRN: [id number] Date of Birth: 1956/01/21   Medicare Important Message Given:  Yes    Leone Haven, RN 01/19/2019, 10:59 AM

## 2019-01-19 NOTE — Progress Notes (Signed)
Occupational Therapy Treatment Patient Details Name: Jeff Ray MRN: [id number] DOB: 10-31-1955 Today's Date: 01/19/2019    History of present illness Jeff Ray a 63 y.o.malewho was admitted to [facility] with acute on chronic hypoxic and hypercapnic respiratory failure, likely due to COPD exacerbation (has had mutliple admissions for this in the past). Was deemed to be a COVID-19 rule out per ID; therefore, required intubation prior to [facility] to [facility] (was on BiPAP prior). Extubated 01/10/19, on [place]; as of 4/12, Covid 19 not detected. On 4/14 pt with incr O2 needs and placed on bipap. Pt transferred back to 2W and retested to COVID which was negative again.    OT comments  Pt with improved activity tolerance but is not at his baseline. Educated on energy conservation and fall prevention. Pt educated on HEP using theraband - written information provided. Continue to recommend follow up with HHOT. Pt anticipates DC today. HR 116; SpO2 88-89 on 3L with minimal activity. Increased time required to recover from any activity. Nsg made aware.   Follow Up Recommendations  Home health OT    Equipment Recommendations  None recommended by OT    Recommendations for Other Services      Precautions / Restrictions Precautions Precautions: Fall Precaution Comments: Very poor activity tolerance Restrictions Weight Bearing Restrictions: No       Mobility Bed Mobility Overal bed mobility: Modified Independent                Transfers Overall transfer level: Needs assistance Equipment used: Rolling walker (2 wheeled) Transfers: Sit to/from Stand Sit to Stand: Min guard              Balance     Sitting balance-Jeff Ray: Good       Standing balance-Jeff Ray: Poor                             ADL either performed or assessed with clinical judgement   ADL       Grooming: Sitting;Set up   Upper Body Bathing: Set up;Sitting                           Functional mobility during ADLs: Min guard;Rolling walker General ADL Comments: Educatd pt on reduing risk of falls and energy soncervation. Pt states he is weaker than normal but that his wife will be able to assist. Pt has DME needed for home     Vision       Perception     Praxis      Cognition Arousal/Alertness: Awake/alert Behavior During Therapy: WFL for tasks assessed/performed Overall Cognitive Status: Within Functional Limits for tasks assessed                                          Exercises Exercises: Other exercises Other Exercises Other Exercises: Educated on HEP with use of theraband. Also educated on bed leel HEP for LE. Written handoutprovided   Shoulder Instructions       General Comments      Pertinent Vitals/ Pain       Pain Assessment: No/denies pain  Home Living  Prior Functioning/Environment              Frequency  Min 2X/week        Progress Toward Goals  OT Goals(current goals can now be found in the care plan section)  Progress towards OT goals: Progressing toward goals  Acute Rehab OT Goals Patient Stated Goal: go home today OT Goal Formulation: With patient Time For Goal Achievement: 01/29/19 Potential to Achieve Goals: Good ADL Goals Pt Will Perform Grooming: with set-up;sitting Pt Will Perform Upper Body Dressing: with set-up;sitting Pt Will Perform Lower Body Dressing: with supervision;sit to/from stand Pt Will Transfer to Toilet: with supervision;ambulating Pt Will Perform Toileting - Clothing Manipulation and hygiene: with supervision;sit to/from stand Additional ADL Goal #1: Pt will utilize energy conservation strategies and breathing techniques during ADL and mobility.  Plan Discharge plan remains appropriate    Co-evaluation                 AM-PAC OT "6 Clicks" Daily Activity     Outcome Measure   Help from another person  eating meals?: None Help from another person taking care of personal grooming?: None Help from another person toileting, which includes using toliet, bedpan, or urinal?: A Little Help from another person bathing (including washing, rinsing, drying)?: A Little Help from another person to put on and taking off regular upper body clothing?: A Little Help from another person to put on and taking off regular lower body clothing?: A Lot 6 Click Score: 19    End of Session Equipment Utilized During Treatment: Oxygen(3L)  OT Visit Diagnosis: Other (comment);Muscle weakness (generalized) (M62.81)(poor activity tolerance)   Activity Tolerance Patient tolerated treatment well   Patient Left in bed;with call bell/phone within reach   Nurse Communication Mobility status;Other (comment)        Time: [id number] OT Time Calculation (min): 30 min  Charges: OT General Charges $OT Visit: 1 Visit OT Treatments $Self Care/Home Management : 8-22 mins $Therapeutic Activity: 8-22 mins  Jeff Ray, OT/L   Acute OT Clinical Specialist Acute Rehabilitation Services Pager [phone] Office [phone]    Jeff Ray [name] 01/19/2019, 11:36 AM

## 2019-01-19 NOTE — Discharge Instructions (Signed)
Insulin Injection Instructions, Using Insulin Pens, Adult A subcutaneous injection is a shot of medicine that is injected into the layer of fat and tissue between skin and muscle. People with type 1 diabetes must take insulin because their bodies do not make it. People with type 2 diabetes may need to take insulin. There are many different types of insulin. The type of insulin that you take may determine how many injections you give yourself and when you need to give the injections. Supplies needed:  Soap and water to wash hands.  Your insulin pen.  A new, unused needle.  Alcohol wipes.  A disposal container that is meant for sharp items (sharps container), such as an empty plastic bottle with a cover. How to choose a site for injection The body absorbs insulin differently, depending on where the insulin is injected (injection site). It is best to inject insulin into the same body area each time (for example, always in the abdomen), but you should use a different spot in that area for each injection. Do not inject the insulin in the same spot each time. There are five main areas that can be used for injecting. These areas include:  Abdomen. This is the preferred area.  Front of thigh.  Upper, outer side of thigh.  Upper, outer side of arm.  Upper, outer part of buttock. How to use an insulin pen  First, follow the steps for Get ready, then continue with the steps for Inject the insulin. Get ready 1. Wash your hands with soap and water. If soap and water are not available, use hand sanitizer. 2. Before you give yourself an insulin injection, be sure to test your blood sugar level (blood glucose level) and write down that number. Follow any instructions from your health care provider about what to do if your blood glucose level is higher or lower than your normal range. 3. Check the expiration date and the type of insulin that is in the pen. 4. If you are using CLEAR insulin, check to  see that it is clear and free of clumps. 5. If you are using CLOUDY insulin, do not shake the pen to get the injection ready. Instead, get it ready in one of these ways: ? Gently roll the pen between your palms several times. ? Tip the pen up and down several times. 6. Remove the cap from the insulin pen. 7. Use an alcohol wipe to clean the rubber tip of the pen. 8. Remove the protective paper tab from the disposable needle. Do not let the needle touch anything. 9. Screw a new, unused needle onto the pen. 10. Remove the outer plastic needle cover. Do not throw away the outer plastic cover yet. ? If the pen uses a special safety needle, leave the inner needle shield in place. ? If the pen does not use a special safety needle, remove the inner plastic cover from the needle. 11. Follow the manufacturer's instructions to prime the insulin pen with the volume of insulin needed. Hold the pen with the needle pointing up, and push the button on the opposite end of the pen until a drop of insulin appears at the needle tip. If no insulin appears, repeat this step. 12. Turn the button (dial) to the number of units of insulin that you will be injecting. Inject the insulin 1. Use an alcohol wipe to clean the site where you will be injecting the needle. Let the site air-dry. 2. Hold the pen in the  palm of your writing hand like a pencil. 3. If directed by your health care provider, use your other hand to pinch and hold about an inch (2.5 cm) of skin at the injection site. Do not directly touch the cleaned part of the skin. 4. Gently but quickly, use your writing hand to put the needle straight into the skin. The needle should be at a 90-degree angle (perpendicular) to the skin. 5. When the needle is completely inserted into the skin, use your thumb or index finger of your writing hand to push the top button of the pen down all the way to inject the insulin. 6. Let go of the skin that you are pinching. Continue  to hold the pen in place with your writing hand. 7. Wait 10 seconds, then pull the needle straight out of the skin. This will allow all of the insulin to go from the pen and needle into your body. 8. Carefully put the larger (outer) plastic cover of the needle back over the needle, then unscrew the capped needle and discard it in a sharps container, such as an empty plastic bottle with a cover. 9. Put the plastic cap back on the insulin pen. How to throw away supplies  Discard all used needles in a puncture-proof sharps disposal container. You can ask your local pharmacy about where you can get this kind of disposal container, or you can use an empty plastic liquid laundry detergent bottle that has a cover.  Follow the disposal regulations for the area where you live. Do not use any needle more than one time.  Throw away empty disposable pens in the regular trash. Questions to ask your health care provider  How often should I be taking insulin?  How often should I check my blood glucose?  What amount of insulin should I be taking at each time?  What are the side effects?  What should I do if my blood glucose is too high?  What should I do if my blood glucose is too low?  What should I do if I forget to take my insulin?  What number should I call if I have questions? Where to find more information  American Diabetes Association (ADA): www.diabetes.org  American Association of Diabetes Educators (AADE) Patient Resources: https://www.diabeteseducator.org Summary  A subcutaneous injection is a shot of medicine that is injected into the layer of fat and tissue between skin and muscle.  Before you give yourself an insulin injection, be sure to test your blood sugar level (blood glucose level) and write down that number.  Check the expiration date and the type of insulin that is in the pen. The type of insulin that you take may determine how many injections you give yourself and when  you need to give the injections.  It is best to inject insulin into the same body area each time (for example, always in the abdomen), but you should use a different spot in that area for each injection. This information is not intended to replace advice given to you by your health care provider. Make sure you discuss any questions you have with your health care provider. Document Released: 10/24/2015 Document Revised: 10/10/2017 Document Reviewed: 10/24/2015 Elsevier Interactive Patient Education  2019 [employer] Diabetes Mellitus and Sick Day Management Blood sugar (glucose) can be difficult to control when you are sick. Common illnesses that can cause problems for people with diabetes (diabetes mellitus) include colds, fever, flu (influenza), nausea, vomiting, and diarrhea. These illnesses  can cause stress and loss of body fluids (dehydration), and those issues can cause blood glucose levels to increase. Because of this, it is very important to take your insulin and diabetes medicines and eat some form of carbohydrate when you are sick. You should make a plan for days when you are sick (sick day plan) as part of your diabetes management plan. You and your health care provider should make this plan in advance. The following guidelines are intended to help you manage an illness that lasts for about 24 hours or less. Your health care provider may also give you more specific instructions. What do I need to do to manage my blood glucose?   Check your blood glucose every 2-4 hours, or as often as told by your health care provider.  Know your sick day treatment goals. Your target blood glucose levels may be different when you are sick.  If you use insulin, take your usual dose. ? If your blood glucose continues to be too high, you may need to take an additional insulin dose as told by your health care provider.  If you use oral diabetes medicine, you may need to stop taking it if you are not able  to eat or drink normally. Ask your health care provider about whether you need to stop taking these medicines while you are sick.  If you use injectable hormone medicines other than insulin to control your diabetes, ask your health care provider about whether you need to stop taking these medicines while you are sick. What else can I do to manage my diabetes when I am sick? Check your ketones  If you have type 1 diabetes, check your urine ketones every 4 hours.  If you have type 2 diabetes, check your urine ketones as often as told by your health care provider. Drink fluids  Drink enough fluid to keep your urine clear or pale yellow. This is especially important if you have a fever, vomiting, or diarrhea. Those symptoms can lead to dehydration.  Follow any instructions from your health care provider about beverages to avoid. ? Do not drink alcohol, caffeine, or drinks that contain a lot of sugar. Take medicines as directed  Take-over-the-counter and prescription medicines only as told by your health care provider.  Check medicine labels for added sugars. Some medicines may contain sugar or types of sugars that can raise your blood glucose level. What foods can I eat when I am sick?  You need to eat some form of carbohydrates when you are sick. You should eat 45-50 grams (45-50 g) of carbohydrates every 3-4 hours until you feel better. All of the food choices below contain about 15 g of carbohydrates. Plan ahead and keep some of these foods around so you have them if you get sick.  4-6 oz (120-177 mL) carbonated beverage that contains sugar, such as regular (not diet) soda. You may be able to drink carbonated beverages more easily if you open the beverage and let it sit at room temperature for a few minutes before drinking.   of a twin frozen ice pop.  4 oz (120 g) regular gelatin.  4 oz (120 mL) fruit juice.  4 oz (120 g) ice cream or frozen yogurt.  2 oz (60 g) sherbet.  8 oz  (240 mL) clear broth or soup.  4 oz (120 g) regular custard.  4 oz (120 g) regular pudding.  8 oz (240 g) plain yogurt.  1 slice bread or toast.  6 saltine crackers.  5 vanilla wafers. Questions to ask your health care provider Consider asking the following questions so you know what to do on days when you are sick:  Should I adjust my diabetes medicines?  How often do I need to check my blood glucose?  What supplies do I need to manage my diabetes at home when I am sick?  What number can I call if I have questions?  What foods and drinks should I avoid? Contact a health care provider if:  You develop symptoms of diabetic ketoacidosis, such as: ? Fatigue. ? Weight loss. ? Excessive thirst. ? Light-headedness. ? Fruity or sweet-smelling breath. ? Excessive urination. ? Vision changes. ? Confusion or irritability. ? Nausea. ? Vomiting. ? Rapid breathing. ? Pain in the abdomen. ? Feeling flushed.  You are unable to drink fluids without vomiting.  You have any of the following for more than 6 hours: ? Nausea. ? Vomiting. ? Diarrhea.  Your blood glucose is at or above 240 mg/dL (16.113.3 mmol/L), even after you take an additional insulin dose.  You have a change in how you think, feel, or act (mental status).  You develop another serious illness.  You have been sick or have had a fever for 2 days or longer and you are not getting better. Get help right away if:  Your blood glucose is lower than 54 mg/dL (3.0 mmol/L).  You have difficulty breathing.  You have moderate or high ketone levels in your urine.  You used emergency glucagon to treat low blood glucose. Summary  Blood sugar (glucose) can be difficult to control when you are sick. Common illnesses that can cause problems for people with diabetes (diabetes mellitus) include colds, fever, flu (influenza), nausea, vomiting, and diarrhea.  Illnesses can cause stress and loss of body fluids (dehydration),  and those issues can cause blood glucose levels to increase.  Make a plan for days when you are sick (sick day plan) as part of your diabetes management plan. You and your health care provider should make this plan in advance.  It is very important to take your insulin and diabetes medicines and to eat some form of carbohydrate when you are sick.  Contact your health care provider if have problems managing your blood glucose levels when you are sick, or if you have been sick or had a fever for 2 days or longer and are not getting better. This information is not intended to replace advice given to you by your health care provider. Make sure you discuss any questions you have with your health care provider. Document Released: 09/23/2003 Document Revised: 06/18/2016 Document Reviewed: 06/18/2016 Elsevier Interactive Patient Education  2019 [employer] Inc. Blood Glucose Monitoring, Adult Monitoring your blood sugar (glucose) is an important part of managing your diabetes (diabetes mellitus). Blood glucose monitoring involves checking your blood glucose as often as directed and keeping a record (log) of your results over time. Checking your blood glucose regularly and keeping a blood glucose log can:  Help you and your health care provider adjust your diabetes management plan as needed, including your medicines or insulin.  Help you understand how food, exercise, illnesses, and medicines affect your blood glucose.  Let you know what your blood glucose is at any time. You can quickly find out if you have low blood glucose (hypoglycemia) or high blood glucose (hyperglycemia). Your health care provider will set individualized treatment goals for you. Your goals will be based on your age, other  medical conditions you have, and how you respond to diabetes treatment. Generally, the goal of treatment is to maintain the following blood glucose levels:  Before meals (preprandial): 80-130 mg/dL (9.4-0.7  mmol/L).  After meals (postprandial): below 180 mg/dL (10 mmol/L).  A1c level: less than 7%. Supplies needed:  Blood glucose meter.  Test strips for your meter. Each meter has its own strips. You must use the strips that came with your meter.  A needle to prick your finger (lancet). Do not use a lancet more than one time.  A device that holds the lancet (lancing device).  A journal or log book to write down your results. How to check your blood glucose  1. Wash your hands with soap and water. 2. Prick the side of your finger (not the tip) with the lancet. Use a different finger each time. 3. Gently rub the finger until a small drop of blood appears. 4. Follow instructions that come with your meter for inserting the test strip, applying blood to the strip, and using your blood glucose meter. 5. Write down your result and any notes. Some meters allow you to use areas of your body other than your finger (alternative sites) to test your blood. The most common alternative sites are:  Forearm.  Thigh.  Palm of the hand. If you think you may have hypoglycemia, or if you have a history of not knowing when your blood glucose is getting low (hypoglycemia unawareness), do not use alternative sites. Use your finger instead. Alternative sites may not be as accurate as the fingers, because blood flow is slower in these areas. This means that the result you get may be delayed, and it may be different from the result that you would get from your finger. Follow these instructions at home: Blood glucose log   Every time you check your blood glucose, write down your result. Also write down any notes about things that may be affecting your blood glucose, such as your diet and exercise for the day. This information can help you and your health care provider: ? Look for patterns in your blood glucose over time. ? Adjust your diabetes management plan as needed.  Check if your meter allows you to  download your records to a computer. Most glucose meters store a record of glucose readings in the meter. If you have type 1 diabetes:  Check your blood glucose 2 or more times a day.  Also check your blood glucose: ? Before every insulin injection. ? Before and after exercise. ? Before meals. ? 2 hours after a meal. ? Occasionally between 2:00 a.m. and 3:00 a.m., as directed. ? Before potentially dangerous tasks, like driving or using heavy machinery. ? At bedtime.  You may need to check your blood glucose more often, up to 6-10 times a day, if you: ? Use an insulin pump. ? Need multiple daily injections (MDI). ? Have diabetes that is not well-controlled. ? Are ill. ? Have a history of severe hypoglycemia. ? Have hypoglycemia unawareness. If you have type 2 diabetes:  If you take insulin or other diabetes medicines, check your blood glucose 2 or more times a day.  If you are on intensive insulin therapy, check your blood glucose 4 or more times a day. Occasionally, you may also need to check between 2:00 a.m. and 3:00 a.m., as directed.  Also check your blood glucose: ? Before and after exercise. ? Before potentially dangerous tasks, like driving or using heavy machinery.  You may need to check your blood glucose more often if: ? Your medicine is being adjusted. ? Your diabetes is not well-controlled. ? You are ill. General tips  Always keep your supplies with you.  If you have questions or need help, all blood glucose meters have a 24-hour "hotline" phone number that you can call. You may also contact your health care provider.  After you use a few boxes of test strips, adjust (calibrate) your blood glucose meter by following instructions that came with your meter. Contact a health care provider if:  Your blood glucose is at or above 240 mg/dL (16.1 mmol/L) for 2 days in a row.  You have been sick or have had a fever for 2 days or longer, and you are not getting  better.  You have any of the following problems for more than 6 hours: ? You cannot eat or drink. ? You have nausea or vomiting. ? You have diarrhea. Get help right away if:  Your blood glucose is lower than 54 mg/dL (3 mmol/L).  You become confused or you have trouble thinking clearly.  You have difficulty breathing.  You have moderate or large ketone levels in your urine. Summary  Monitoring your blood sugar (glucose) is an important part of managing your diabetes (diabetes mellitus).  Blood glucose monitoring involves checking your blood glucose as often as directed and keeping a record (log) of your results over time.  Your health care provider will set individualized treatment goals for you. Your goals will be based on your age, other medical conditions you have, and how you respond to diabetes treatment.  Every time you check your blood glucose, write down your result. Also write down any notes about things that may be affecting your blood glucose, such as your diet and exercise for the day. This information is not intended to replace advice given to you by your health care provider. Make sure you discuss any questions you have with your health care provider. Document Released: 09/23/2003 Document Revised: 08/01/2017 Document Reviewed: 03/01/2016 Elsevier Interactive Patient Education  2019 [employer]. This hospitalization your Hemoglobin A1c (average of your blood sugar over the past 2-3 months) is 11.1% (average of 270 blood sugar). This is higher than the recommended A1c of 7%. You have been on steroids which does contribute to higher blood sugars. Please make sure to follow up with your medical MD after discharge about your diabetes.

## 2019-01-25 ENCOUNTER — Inpatient Hospital Stay (HOSPITAL_COMMUNITY)
Admission: EM | Admit: 2019-01-25 | Discharge: 2019-02-01 | DRG: 190 | Disposition: A | Discharge status: Home Health | Payer: Medicare HMO | Attending: Family Medicine | Admitting: Family Medicine

## 2019-01-25 ENCOUNTER — Inpatient Hospital Stay: Payer: Self-pay

## 2019-01-25 ENCOUNTER — Emergency Department (HOSPITAL_COMMUNITY): Payer: Medicare HMO

## 2019-01-25 ENCOUNTER — Encounter (HOSPITAL_COMMUNITY): Payer: Self-pay

## 2019-01-25 ENCOUNTER — Other Ambulatory Visit: Payer: Self-pay

## 2019-01-25 ENCOUNTER — Inpatient Hospital Stay (HOSPITAL_COMMUNITY): Payer: Medicare HMO

## 2019-01-25 DIAGNOSIS — Z79899 Other long term (current) drug therapy: Secondary | ICD-10-CM

## 2019-01-25 DIAGNOSIS — Z87891 Personal history of nicotine dependence: Secondary | ICD-10-CM

## 2019-01-25 DIAGNOSIS — Z833 Family history of diabetes mellitus: Secondary | ICD-10-CM

## 2019-01-25 DIAGNOSIS — Z720 Tobacco use: Secondary | ICD-10-CM | POA: Diagnosis present

## 2019-01-25 DIAGNOSIS — K219 Gastro-esophageal reflux disease without esophagitis: Secondary | ICD-10-CM | POA: Diagnosis present

## 2019-01-25 DIAGNOSIS — J189 Pneumonia, unspecified organism: Secondary | ICD-10-CM | POA: Diagnosis present

## 2019-01-25 DIAGNOSIS — J440 Chronic obstructive pulmonary disease with acute lower respiratory infection: Principal | ICD-10-CM | POA: Diagnosis present

## 2019-01-25 DIAGNOSIS — D649 Anemia, unspecified: Secondary | ICD-10-CM | POA: Diagnosis present

## 2019-01-25 DIAGNOSIS — E1165 Type 2 diabetes mellitus with hyperglycemia: Secondary | ICD-10-CM | POA: Diagnosis not present

## 2019-01-25 DIAGNOSIS — E782 Mixed hyperlipidemia: Secondary | ICD-10-CM

## 2019-01-25 DIAGNOSIS — R7989 Other specified abnormal findings of blood chemistry: Secondary | ICD-10-CM | POA: Diagnosis not present

## 2019-01-25 DIAGNOSIS — F411 Generalized anxiety disorder: Secondary | ICD-10-CM | POA: Diagnosis present

## 2019-01-25 DIAGNOSIS — I2699 Other pulmonary embolism without acute cor pulmonale: Secondary | ICD-10-CM | POA: Diagnosis not present

## 2019-01-25 DIAGNOSIS — E119 Type 2 diabetes mellitus without complications: Secondary | ICD-10-CM

## 2019-01-25 DIAGNOSIS — F419 Anxiety disorder, unspecified: Secondary | ICD-10-CM | POA: Diagnosis not present

## 2019-01-25 DIAGNOSIS — J9620 Acute and chronic respiratory failure, unspecified whether with hypoxia or hypercapnia: Secondary | ICD-10-CM | POA: Diagnosis present

## 2019-01-25 DIAGNOSIS — J9611 Chronic respiratory failure with hypoxia: Secondary | ICD-10-CM

## 2019-01-25 DIAGNOSIS — J9622 Acute and chronic respiratory failure with hypercapnia: Secondary | ICD-10-CM | POA: Diagnosis not present

## 2019-01-25 DIAGNOSIS — J9621 Acute and chronic respiratory failure with hypoxia: Secondary | ICD-10-CM | POA: Diagnosis not present

## 2019-01-25 DIAGNOSIS — T380X5A Adverse effect of glucocorticoids and synthetic analogues, initial encounter: Secondary | ICD-10-CM | POA: Diagnosis present

## 2019-01-25 DIAGNOSIS — Z7951 Long term (current) use of inhaled steroids: Secondary | ICD-10-CM

## 2019-01-25 DIAGNOSIS — J441 Chronic obstructive pulmonary disease with (acute) exacerbation: Secondary | ICD-10-CM | POA: Diagnosis present

## 2019-01-25 DIAGNOSIS — R6510 Systemic inflammatory response syndrome (SIRS) of non-infectious origin without acute organ dysfunction: Secondary | ICD-10-CM | POA: Diagnosis present

## 2019-01-25 DIAGNOSIS — I10 Essential (primary) hypertension: Secondary | ICD-10-CM | POA: Diagnosis present

## 2019-01-25 DIAGNOSIS — Z20828 Contact with and (suspected) exposure to other viral communicable diseases: Secondary | ICD-10-CM | POA: Diagnosis present

## 2019-01-25 DIAGNOSIS — R740 Nonspecific elevation of levels of transaminase and lactic acid dehydrogenase [LDH]: Secondary | ICD-10-CM | POA: Diagnosis present

## 2019-01-25 DIAGNOSIS — Z9981 Dependence on supplemental oxygen: Secondary | ICD-10-CM | POA: Diagnosis not present

## 2019-01-25 DIAGNOSIS — E785 Hyperlipidemia, unspecified: Secondary | ICD-10-CM | POA: Diagnosis present

## 2019-01-25 DIAGNOSIS — E1169 Type 2 diabetes mellitus with other specified complication: Secondary | ICD-10-CM | POA: Diagnosis not present

## 2019-01-25 DIAGNOSIS — R0602 Shortness of breath: Secondary | ICD-10-CM | POA: Diagnosis present

## 2019-01-25 DIAGNOSIS — R0902 Hypoxemia: Secondary | ICD-10-CM | POA: Diagnosis not present

## 2019-01-25 DIAGNOSIS — Y95 Nosocomial condition: Secondary | ICD-10-CM | POA: Diagnosis present

## 2019-01-25 DIAGNOSIS — Z794 Long term (current) use of insulin: Secondary | ICD-10-CM | POA: Diagnosis not present

## 2019-01-25 HISTORY — DX: Pneumonia, unspecified organism: J18.9

## 2019-01-25 LAB — URINALYSIS, ROUTINE W REFLEX MICROSCOPIC
Bacteria, UA: NONE SEEN
Bilirubin Urine: NEGATIVE
Glucose, UA: 150 mg/dL — AB
Hgb urine dipstick: NEGATIVE
Ketones, ur: NEGATIVE mg/dL
Leukocytes,Ua: NEGATIVE
Nitrite: NEGATIVE
Protein, ur: 30 mg/dL — AB
Specific Gravity, Urine: 1.027 (ref 1.005–1.030)
pH: 7 (ref 5.0–8.0)

## 2019-01-25 LAB — CBC WITH DIFFERENTIAL/PLATELET
Basophils Absolute: 0 10*3/uL (ref 0.0–0.1)
Basophils Relative: 0 %
Eosinophils Absolute: 0.3 10*3/uL (ref 0.0–0.5)
Eosinophils Relative: 1 %
HCT: 46.9 % (ref 39.0–52.0)
Hemoglobin: 14.4 g/dL (ref 13.0–17.0)
Lymphocytes Relative: 7 %
Lymphs Abs: 2.1 10*3/uL (ref 0.7–4.0)
MCH: 26.7 pg (ref 26.0–34.0)
MCHC: 30.7 g/dL (ref 30.0–36.0)
MCV: 87 fL (ref 80.0–100.0)
Monocytes Absolute: 1.8 10*3/uL — ABNORMAL HIGH (ref 0.1–1.0)
Monocytes Relative: 6 %
Neutro Abs: 26.4 10*3/uL — ABNORMAL HIGH (ref 1.7–7.7)
Neutrophils Relative %: 86 %
Platelets: 304 10*3/uL (ref 150–400)
RBC: 5.39 MIL/uL (ref 4.22–5.81)
RDW: 14.5 % (ref 11.5–15.5)
WBC: 30.7 10*3/uL — ABNORMAL HIGH (ref 4.0–10.5)
nRBC: 0 % (ref 0.0–0.2)

## 2019-01-25 LAB — COMPREHENSIVE METABOLIC PANEL
ALT: 42 U/L (ref 0–44)
AST: 18 U/L (ref 15–41)
Albumin: 2.8 g/dL — ABNORMAL LOW (ref 3.5–5.0)
Alkaline Phosphatase: 152 U/L — ABNORMAL HIGH (ref 38–126)
Anion gap: 14 (ref 5–15)
BUN: 15 mg/dL (ref 8–23)
CO2: 30 mmol/L (ref 22–32)
Calcium: 9.9 mg/dL (ref 8.9–10.3)
Chloride: 89 mmol/L — ABNORMAL LOW (ref 98–111)
Creatinine, Ser: 0.94 mg/dL (ref 0.61–1.24)
GFR calc Af Amer: >60 mL/min (ref >60)
GFR calc non Af Amer: >60 mL/min (ref >60)
Glucose, Bld: 161 mg/dL — ABNORMAL HIGH (ref 70–99)
Potassium: 3.9 mmol/L (ref 3.5–5.1)
Sodium: 133 mmol/L — ABNORMAL LOW (ref 135–145)
Total Bilirubin: 0.6 mg/dL (ref 0.3–1.2)
Total Protein: 7.7 g/dL (ref 6.5–8.1)

## 2019-01-25 LAB — BLOOD GAS, ARTERIAL
Acid-Base Excess: 9.7 mmol/L — ABNORMAL HIGH (ref 0.0–2.0)
Acid-Base Excess: 9.3 mmol/L — ABNORMAL HIGH (ref 0.0–2.0)
Bicarbonate: 32.7 mmol/L — ABNORMAL HIGH (ref 20.0–28.0)
Bicarbonate: 32.3 mmol/L — ABNORMAL HIGH (ref 20.0–28.0)
FIO2: 100
FIO2: 44
O2 Saturation: 90.1 %
O2 Saturation: 89.6 %
Patient temperature: 37.8
Patient temperature: 37.8
pCO2 arterial: 48.4 mmHg — ABNORMAL HIGH (ref 32.0–48.0)
pCO2 arterial: 48.9 mmHg — ABNORMAL HIGH (ref 32.0–48.0)
pH, Arterial: 7.461 — ABNORMAL HIGH (ref 7.350–7.450)
pH, Arterial: 7.452 — ABNORMAL HIGH (ref 7.350–7.450)
pO2, Arterial: 60 mmHg — ABNORMAL LOW (ref 83.0–108.0)
pO2, Arterial: 59.7 mmHg — ABNORMAL LOW (ref 83.0–108.0)

## 2019-01-25 LAB — PROCALCITONIN: Procalcitonin: <0.1 ng/mL

## 2019-01-25 LAB — SARS CORONAVIRUS 2 BY RT PCR (HOSPITAL ORDER, PERFORMED IN ~~LOC~~ HOSPITAL LAB): SARS Coronavirus 2: NEGATIVE

## 2019-01-25 LAB — GLUCOSE, CAPILLARY
Glucose-Capillary: 177 mg/dL — ABNORMAL HIGH (ref 70–99)
Glucose-Capillary: 196 mg/dL — ABNORMAL HIGH (ref 70–99)
Glucose-Capillary: 240 mg/dL — ABNORMAL HIGH (ref 70–99)

## 2019-01-25 LAB — LACTIC ACID, PLASMA
Lactic Acid, Venous: 2.1 mmol/L (ref 0.5–1.9)
Lactic Acid, Venous: 2.3 mmol/L (ref 0.5–1.9)

## 2019-01-25 LAB — CBG MONITORING, ED: Glucose-Capillary: 173 mg/dL — ABNORMAL HIGH (ref 70–99)

## 2019-01-25 MED ORDER — FLUTICASONE PROPIONATE 50 MCG/ACT NA SUSP
2.0000 | Freq: Every day | NASAL | Status: DC
  Administered 2019-01-26 – 2019-02-01 (×7): 2 via NASAL
  Filled 2019-01-25 (×2): qty 16

## 2019-01-25 MED ORDER — MONTELUKAST SODIUM 10 MG PO TABS
10.0000 mg | ORAL_TABLET | Freq: Every day | ORAL | Status: DC
  Administered 2019-01-25 – 2019-01-31 (×7): 10 mg via ORAL
  Filled 2019-01-25 (×7): qty 1

## 2019-01-25 MED ORDER — SODIUM CHLORIDE 0.9 % IV BOLUS
500.0000 mL | Freq: Once | INTRAVENOUS | Status: AC
  Administered 2019-01-25: 500 mL via INTRAVENOUS

## 2019-01-25 MED ORDER — IPRATROPIUM-ALBUTEROL 0.5-2.5 (3) MG/3ML IN SOLN
3.0000 mL | RESPIRATORY_TRACT | Status: DC
  Administered 2019-01-25 – 2019-01-28 (×19): 3 mL via RESPIRATORY_TRACT
  Filled 2019-01-25 (×20): qty 3

## 2019-01-25 MED ORDER — VANCOMYCIN HCL IN DEXTROSE 1-5 GM/200ML-% IV SOLN: 1000.0000 mg | Freq: Once | INTRAVENOUS | Status: DC

## 2019-01-25 MED ORDER — CHLORHEXIDINE GLUCONATE CLOTH 2 % EX PADS
6.0000 | MEDICATED_PAD | Freq: Every day | CUTANEOUS | Status: DC
  Administered 2019-01-25 – 2019-01-31 (×7): 6 via TOPICAL

## 2019-01-25 MED ORDER — SODIUM CHLORIDE 0.9 % IV SOLN
1.0000 g | Freq: Once | INTRAVENOUS | Status: AC
  Administered 2019-01-25: 1 g via INTRAVENOUS
  Filled 2019-01-25: qty 1

## 2019-01-25 MED ORDER — IPRATROPIUM-ALBUTEROL 0.5-2.5 (3) MG/3ML IN SOLN
3.0000 mL | Freq: Once | RESPIRATORY_TRACT | Status: AC
  Administered 2019-01-25: 3 mL via RESPIRATORY_TRACT
  Filled 2019-01-25: qty 3

## 2019-01-25 MED ORDER — ALBUTEROL SULFATE HFA 108 (90 BASE) MCG/ACT IN AERS
INHALATION_SPRAY | RESPIRATORY_TRACT | Status: AC
  Administered 2019-01-25: 09:00:00 6 via RESPIRATORY_TRACT
  Filled 2019-01-25: qty 6.7

## 2019-01-25 MED ORDER — TRAZODONE HCL 50 MG PO TABS
25.0000 mg | ORAL_TABLET | Freq: Every evening | ORAL | Status: DC | PRN
  Administered 2019-01-26 – 2019-01-31 (×5): 25 mg via ORAL
  Filled 2019-01-25 (×5): qty 1

## 2019-01-25 MED ORDER — ENOXAPARIN SODIUM 40 MG/0.4ML ~~LOC~~ SOLN
40.0000 mg | SUBCUTANEOUS | Status: DC
  Administered 2019-01-25 – 2019-01-27 (×3): 40 mg via SUBCUTANEOUS
  Filled 2019-01-25 (×3): qty 0.4

## 2019-01-25 MED ORDER — ALBUTEROL SULFATE HFA 108 (90 BASE) MCG/ACT IN AERS
6.0000 | INHALATION_SPRAY | Freq: Once | RESPIRATORY_TRACT | Status: AC
  Administered 2019-01-25: 6 via RESPIRATORY_TRACT

## 2019-01-25 MED ORDER — ALBUTEROL SULFATE (2.5 MG/3ML) 0.083% IN NEBU
2.5000 mg | INHALATION_SOLUTION | Freq: Once | RESPIRATORY_TRACT | Status: AC
  Administered 2019-01-25: 2.5 mg via RESPIRATORY_TRACT
  Filled 2019-01-25: qty 3

## 2019-01-25 MED ORDER — PANTOPRAZOLE SODIUM 40 MG PO TBEC
40.0000 mg | DELAYED_RELEASE_TABLET | Freq: Every morning | ORAL | Status: DC
  Administered 2019-01-26 – 2019-02-01 (×7): 40 mg via ORAL
  Filled 2019-01-25 (×7): qty 1

## 2019-01-25 MED ORDER — INSULIN GLARGINE 100 UNIT/ML ~~LOC~~ SOLN
45.0000 [IU] | Freq: Every day | SUBCUTANEOUS | Status: DC
  Filled 2019-01-25: qty 0.45

## 2019-01-25 MED ORDER — METHYLPREDNISOLONE SODIUM SUCC 40 MG IJ SOLR
40.0000 mg | INTRAMUSCULAR | Status: DC
  Administered 2019-01-25 – 2019-01-29 (×22): 40 mg via INTRAVENOUS
  Filled 2019-01-25 (×22): qty 1

## 2019-01-25 MED ORDER — SODIUM CHLORIDE 0.9% FLUSH: 10.0000 mL | INTRAVENOUS | Status: DC | PRN

## 2019-01-25 MED ORDER — SODIUM CHLORIDE 0.9 % IV BOLUS
500.0000 mL | Freq: Once | INTRAVENOUS | Status: AC
  Administered 2019-01-25: 12:00:00 500 mL via INTRAVENOUS

## 2019-01-25 MED ORDER — VANCOMYCIN HCL 10 G IV SOLR
1500.0000 mg | Freq: Once | INTRAVENOUS | Status: AC
  Administered 2019-01-25: 1500 mg via INTRAVENOUS
  Filled 2019-01-25: qty 1500

## 2019-01-25 MED ORDER — INSULIN ASPART 100 UNIT/ML ~~LOC~~ SOLN
0.0000 [IU] | Freq: Three times a day (TID) | SUBCUTANEOUS | Status: DC
  Administered 2019-01-25: 4 [IU] via SUBCUTANEOUS
  Administered 2019-01-26: 20 [IU] via SUBCUTANEOUS
  Administered 2019-01-26: 7 [IU] via SUBCUTANEOUS
  Administered 2019-01-26: 3 [IU] via SUBCUTANEOUS
  Administered 2019-01-27: 7 [IU] via SUBCUTANEOUS
  Administered 2019-01-27: 3 [IU] via SUBCUTANEOUS
  Administered 2019-01-27: 7 [IU] via SUBCUTANEOUS
  Administered 2019-01-28 (×2): 11 [IU] via SUBCUTANEOUS
  Administered 2019-01-28: 3 [IU] via SUBCUTANEOUS
  Administered 2019-01-29 – 2019-01-30 (×3): 4 [IU] via SUBCUTANEOUS
  Administered 2019-01-30 – 2019-02-01 (×3): 3 [IU] via SUBCUTANEOUS

## 2019-01-25 MED ORDER — SODIUM CHLORIDE 0.9 % IV BOLUS
1000.0000 mL | Freq: Once | INTRAVENOUS | Status: AC
  Administered 2019-01-25: 1000 mL via INTRAVENOUS

## 2019-01-25 MED ORDER — VANCOMYCIN HCL IN DEXTROSE 750-5 MG/150ML-% IV SOLN
750.0000 mg | Freq: Two times a day (BID) | INTRAVENOUS | Status: DC
  Administered 2019-01-25: 750 mg via INTRAVENOUS
  Filled 2019-01-25 (×2): qty 150

## 2019-01-25 MED ORDER — ALBUTEROL SULFATE (2.5 MG/3ML) 0.083% IN NEBU: 5.0000 mg | INHALATION_SOLUTION | Freq: Once | RESPIRATORY_TRACT | Status: DC

## 2019-01-25 MED ORDER — SODIUM CHLORIDE 0.9 % IV SOLN
1.0000 g | Freq: Three times a day (TID) | INTRAVENOUS | Status: DC
  Administered 2019-01-25 – 2019-01-26 (×3): 1 g via INTRAVENOUS
  Filled 2019-01-25 (×3): qty 1

## 2019-01-25 MED ORDER — ONDANSETRON HCL 4 MG PO TABS: 4.0000 mg | ORAL_TABLET | Freq: Four times a day (QID) | ORAL | Status: DC | PRN

## 2019-01-25 MED ORDER — ROSUVASTATIN CALCIUM 20 MG PO TABS
40.0000 mg | ORAL_TABLET | Freq: Every day | ORAL | Status: DC
  Administered 2019-01-25 – 2019-01-31 (×7): 40 mg via ORAL
  Filled 2019-01-25 (×7): qty 2

## 2019-01-25 MED ORDER — SODIUM CHLORIDE 0.9% FLUSH
10.0000 mL | Freq: Two times a day (BID) | INTRAVENOUS | Status: DC
  Administered 2019-01-26 – 2019-01-27 (×3): 10 mL
  Administered 2019-01-27: 40 mL
  Administered 2019-01-28 – 2019-01-31 (×8): 10 mL

## 2019-01-25 MED ORDER — ALPRAZOLAM 0.5 MG PO TABS
0.5000 mg | ORAL_TABLET | Freq: Two times a day (BID) | ORAL | Status: DC | PRN
  Administered 2019-01-25 – 2019-02-01 (×11): 0.5 mg via ORAL
  Filled 2019-01-25 (×11): qty 1

## 2019-01-25 MED ORDER — IPRATROPIUM BROMIDE 0.02 % IN SOLN: 0.5000 mg | Freq: Once | RESPIRATORY_TRACT | Status: DC

## 2019-01-25 MED ORDER — ONDANSETRON HCL 4 MG/2ML IJ SOLN: 4.0000 mg | Freq: Four times a day (QID) | INTRAMUSCULAR | Status: DC | PRN

## 2019-01-25 MED ORDER — OXYBUTYNIN CHLORIDE 5 MG PO TABS
5.0000 mg | ORAL_TABLET | Freq: Two times a day (BID) | ORAL | Status: DC
  Administered 2019-01-25 – 2019-02-01 (×14): 5 mg via ORAL
  Filled 2019-01-25 (×14): qty 1

## 2019-01-25 NOTE — ED Notes (Signed)
Pt difficult IV stick. Required [place] IV insertion. Only able to obtain one site at this time

## 2019-01-25 NOTE — ED Notes (Signed)
CRITICAL VALUE ALERT  Critical Value:  Lactic Acid - 2.3  Date & Time Notied:  01/25/19    1058  Provider Notified: Dr Rubin Payor  Orders Received/Actions taken:

## 2019-01-25 NOTE — ED Provider Notes (Addendum)
[facility] EMERGENCY DEPARTMENT Provider Note   CSN: [id number] Arrival date & time: 01/25/19  4098    History   Chief Complaint Chief Complaint  Patient presents with   Shortness of Breath    HPI Jeff Ray is a 63 y.o. male.    Level 5 caveat due to respiratory distress. HPI Brought in by EMS for shortness of breath.  Recent had admission to the hospital for pneumonia and required intubation at time.  History of COPD and is on chronic oxygen.  While in the hospital had negative COVID testing x2.  States he had been doing better but now doing worse.  Currently having a cough.  Sats of 84% on 4 L per EMS.  Discharged from the hospital 5 days ago.  Somewhat difficult to get history from patient. Past Medical History:  Diagnosis Date   Anxiety    Asthma    COPD (chronic obstructive pulmonary disease) (HCC)    Diabetes mellitus    History of nuclear stress test 02/2016   low risk study   Hypertension    On home O2    2L N/C    Pneumonia     Patient Active Problem List   Diagnosis Date Noted   Pneumonia 01/09/2019   Acute metabolic encephalopathy 01/09/2019   Suspected Covid-19 Virus Infection    Diabetic ketoacidosis with coma associated with type 2 diabetes mellitus (HCC)    Acute respiratory failure with hypoxia (HCC) 10/28/2018   Influenza A 10/27/2018   COPD with acute exacerbation (HCC) 09/09/2018   Chronic respiratory failure with hypoxia (HCC) 09/08/2018   Uncontrolled type 2 diabetes mellitus with hyperglycemia (HCC) 09/08/2018   Elevated troponin 09/08/2018   SIRS (systemic inflammatory response syndrome) (HCC) 09/07/2018   Asthma 09/07/2018   HLD (hyperlipidemia) 09/07/2018   GERD (gastroesophageal reflux disease) 09/07/2018   Anxiety 09/07/2018   BPH (benign prostatic hyperplasia) 09/07/2018   Anemia 08/03/2015   CAP (community acquired pneumonia) 06/02/2013   Acute on chronic respiratory failure with hypercapnia (HCC)  05/26/2013   COPD exacerbation (HCC) 05/26/2013   HTN (hypertension) 05/26/2013   DM (diabetes mellitus) (HCC) 05/26/2013   Tobacco abuse 05/26/2013    Past Surgical History:  Procedure Laterality Date   BACK SURGERY          Home Medications    Prior to Admission medications   Medication Sig Start Date End Date Taking? Authorizing Provider  albuterol (PROAIR HFA) 108 (90 BASE) MCG/ACT inhaler Inhale 2 puffs into the lungs every 6 (six) hours as needed for wheezing or shortness of breath. Shortness of Breath    [provider]  ALPRAZolam (XANAX) 1 MG tablet Take 0.5 tablets (0.5 mg total) by mouth 2 (two) times daily as needed for anxiety. 01/19/19   Zannie Cove, MD  fexofenadine-pseudoephedrine (ALLEGRA-D 24) 180-240 MG 24 hr tablet Take 1 tablet by mouth daily.    [provider]  fluticasone (FLONASE) 50 MCG/ACT nasal spray Place 2 sprays into both nostrils daily. 09/30/17   [provider]  glimepiride (AMARYL) 2 MG tablet Take 2 mg by mouth daily with breakfast.    [provider]  Guaifenesin (MUCINEX MAXIMUM STRENGTH) 1200 MG TB12 Take 1 tablet (1,200 mg total) by mouth 2 (two) times daily. 12/11/18   Shon Hale, MD  Insulin Glargine (LANTUS) 100 UNIT/ML Solostar Pen Inject 30 Units into the skin daily. 01/19/19   Zannie Cove, MD  ipratropium-albuterol (DUONEB) 0.5-2.5 (3) MG/3ML SOLN Take 3 mLs by nebulization  every 4 (four) hours as needed. For shortness of breath    [provider]  metFORMIN (GLUCOPHAGE-XR) 500 MG 24 hr tablet Take 500 mg by mouth 2 (two) times daily.     [provider]  montelukast (SINGULAIR) 10 MG tablet Take 10 mg by mouth every morning.     [provider]  Multiple Vitamins-Minerals (CENTRUM SILVER 50+MEN PO) Take 1 tablet by mouth daily.    [provider]  ondansetron (ZOFRAN) 4 MG tablet Take 1 tablet (4 mg total) by mouth every 6 (six) hours as needed for  nausea. Patient not taking: Reported on 01/09/2019 12/11/18   Shon Hale, MD  oxybutynin (DITROPAN) 5 MG tablet Take 1 tablet (5 mg total) by mouth 2 (two) times daily. 01/19/19   Zannie Cove, MD  pantoprazole (PROTONIX) 40 MG tablet Take 40 mg by mouth every morning.     [provider]  predniSONE (DELTASONE) 20 MG tablet Take 1-3 tablets (20-60 mg total) by mouth daily with breakfast. Take  daily for 2days then  daily for 2 days then  daily for 2days then STOP 01/19/19   Zannie Cove, MD  rosuvastatin (CRESTOR) 40 MG tablet Take 40 mg by mouth at bedtime.  07/24/17   [provider]  TRELEGY ELLIPTA 100-62.5-25 MCG/INH AEPB Take 2 puffs by mouth daily. 11/17/18   [provider]    Family History Family History  Problem Relation Age of Onset   Diabetes Mother     Social History Social History   Tobacco Use   Smoking status: Former Smoker    Packs/day: 0.00    Years: 10.00    Pack years: 0.00    Types: Cigarettes    Last attempt to quit: 10/20/2012    Years since quitting: 6.2   Smokeless tobacco: Never Used  Substance Use Topics   Alcohol use: No    Alcohol/week: 0.0 standard drinks   Drug use: No     Allergies   Patient has no known allergies.   Review of Systems Review of Systems  Unable to perform ROS: Severe respiratory distress     Physical Exam Updated Vital Signs BP 114/72 Comment: Simultaneous filing. User may not have seen previous data.   Pulse (!) 110    Temp (!) 100.9 F (38.3 C) (Rectal)    Resp (!) 28    Ht  (1.753 m)    Wt 71.8 kg    SpO2 92%    BMI 23.38 kg/m   Physical Exam Vitals signs and nursing note reviewed.  Constitutional:      Comments: Sitting in bed with eyes closed.  Initially on nonrebreather.  HENT:     Head: Normocephalic.  Cardiovascular:     Rate and Rhythm: Tachycardia present.  Pulmonary:     Effort: Tachypnea present.     Comments: Somewhat decreased breath sounds.   Tachypnea.  No clear wheezes. Chest:     Chest wall: No tenderness.  Musculoskeletal:     Right lower leg: No edema.     Left lower leg: No edema.  Skin:    General: Skin is warm.  Neurological:     Comments: Sitting in bed with eyes closed.  However will open somewhat to conversation.  Does appear to have some mild confusion.      ED Treatments / Results  Labs (all labs ordered are listed, but only abnormal results are displayed) Labs Reviewed  BLOOD GAS, ARTERIAL - Abnormal; Notable for  the following components:      Result Value   pH, Arterial 7.461 (*)    pCO2 arterial 48.4 (*)    pO2, Arterial 60.0 (*)    Bicarbonate 32.7 (*)    Acid-Base Excess 9.7 (*)    All other components within normal limits  COMPREHENSIVE METABOLIC PANEL - Abnormal; Notable for the following components:   Sodium 133 (*)    Chloride 89 (*)    Glucose, Bld 161 (*)    Albumin 2.8 (*)    Alkaline Phosphatase 152 (*)    All other components within normal limits  CBC WITH DIFFERENTIAL/PLATELET - Abnormal; Notable for the following components:   WBC 30.7 (*)    Neutro Abs 26.4 (*)    Monocytes Absolute 1.8 (*)    All other components within normal limits  LACTIC ACID, PLASMA - Abnormal; Notable for the following components:   Lactic Acid, Venous 2.1 (*)    All other components within normal limits  BLOOD GAS, ARTERIAL - Abnormal; Notable for the following components:   pH, Arterial 7.452 (*)    pCO2 arterial 48.9 (*)    pO2, Arterial 59.7 (*)    Bicarbonate 32.3 (*)    Acid-Base Excess 9.3 (*)    Allens test (pass/fail) BRACHIAL ARTERY (*)    All other components within normal limits  CULTURE, BLOOD (ROUTINE X 2)  CULTURE, BLOOD (ROUTINE X 2)  MRSA PCR SCREENING  LACTIC ACID, PLASMA  URINALYSIS, ROUTINE W REFLEX MICROSCOPIC    EKG EKG Interpretation  Date/Time:  Thursday January 25 2019 07:18:55 EDT Ventricular Rate:  123 PR Interval:    QRS Duration: 123 QT Interval:  321 QTC  Calculation: 460 R Axis:   -157 Text Interpretation:  Sinus tachycardia Atrial premature complex RBBB and LPFB No significant change since last tracing Confirmed by Benjiman CorePickering, Shanee Batch [id number]) on 01/25/2019 7:54:51 AM   Radiology Dg Chest Portable 1 View  Result Date: 01/25/2019 CLINICAL DATA:  Acute on chronic respiratory failure. EXAM: PORTABLE CHEST 1 VIEW COMPARISON:  Single-view of the chest 01/16/2019, 01/15/2019 and 10/27/2018. FINDINGS: The lungs are emphysematous. Increased airspace disease is seen in the left upper lobe. There is basilar fibrosis, worse on the left. Heart size is normal. No pneumothorax. IMPRESSION: Increased left upper lobe airspace opacity worrisome for pneumonia superimposed on scar. Emphysema and basilar fibrosis, worse on the left. Electronically Signed   By: Drusilla Kannerhomas  Dalessio M.D.   On: 01/25/2019 07:48    Procedures Procedures (including critical care time)  Medications Ordered in ED Medications  vancomycin (VANCOCIN) 1,500 mg in sodium chloride 0.9 % 500 mL IVPB (1,500 mg Intravenous New Bag/Given 01/25/19 1003)  meropenem (MERREM) 1 g in sodium chloride 0.9 % 100 mL IVPB (has no administration in time range)  vancomycin (VANCOCIN) IVPB 750 mg/150 ml premix (has no administration in time range)  meropenem (MERREM) 1 g in sodium chloride 0.9 % 100 mL IVPB (0 g Intravenous Stopped 01/25/19 0942)  albuterol (VENTOLIN HFA) 108 (90 Base) MCG/ACT inhaler 6 puff (6 puffs Inhalation Given 01/25/19 [id number])     Initial Impression / Assessment and Plan / ED Course  I have reviewed the triage vital signs and the nursing notes.  Pertinent labs & imaging results that were available during my care of the patient were reviewed by me and considered in my medical decision making (see chart for details).       Patient presents with shortness of breath.  Worsening x-ray.  Recent admission to  the hospital for pneumonia and so was treated for healthcare associated pneumonia.   While in the hospital had 2- COVID tests.  ABG done and shows a hypoxemia and elevated PCO2, although the PCO2 is lower than previous.  He is now on 6 L with sats that are staying above 90.  Still has some mild confusion however.  Discussed with Dr. Everardo All from pulmonary critical care.  This point I think we can BiPAP as he is low risk with COVID with a negative testing.  Will discuss with hospitalist because if they are going to order a COVID test hospital not allow BiPAP.  Repeat ABG stable.  White count elevated but is currently on steroids.  Will admit to hospitalist.  CRITICAL CARE Performed by: Benjiman Core Total critical care time: 30 minutes Critical care time was exclusive of separately billable procedures and treating other patients. Critical care was necessary to treat or prevent imminent or life-threatening deterioration. Critical care was time spent personally by me on the following activities: development of treatment plan with patient and/or surrogate as well as nursing, discussions with consultants, evaluation of patient's response to treatment, examination of patient, obtaining history from patient or surrogate, ordering and performing treatments and interventions, ordering and review of laboratory studies, ordering and review of radiographic studies, pulse oximetry and re-evaluation of patient's condition.  Discussed with Dr. Laural Benes.  States he cannot take the patient until there is a new updated COVID test.  COVID testing is negative.    Final Clinical Impressions(s) / ED Diagnoses   Final diagnoses:  HCAP (healthcare-associated pneumonia)  Acute on chronic respiratory failure with hypoxia and hypercapnia [facility])    ED Discharge Orders    None       Benjiman Core, MD 01/25/19 1034    Benjiman Core, MD 01/25/19 1051    Benjiman Core, MD 01/25/19 1204

## 2019-01-25 NOTE — Progress Notes (Signed)
Patient had removed himself from the BIPAP mask, stating that he could not breathe and that he needed his oxygen on. RT placed patient back on BIPAP and explained to him the importance of keeping the mask on and instructed him not to remove without calling for some assistance. Patient appears to be tolerating at this time, RT will continue to monitor

## 2019-01-25 NOTE — Care Management (Signed)
Notified by Bonita Quin of [facility] that patient is active with home health RN services.

## 2019-01-25 NOTE — ED Notes (Addendum)
NRB removed. Pt placed on hi flow O2 @ 6 L. Pt is able to answer questions appropriately, although speech is difficult to understand

## 2019-01-25 NOTE — ED Notes (Signed)
Unable to start antibiotics due to waiting on lab to draw second set of blood cultures pt is a hard stick

## 2019-01-25 NOTE — ED Notes (Signed)
Date and time results received: 01/25/19 0848 (use smartphrase ".now" to insert current time)  Test: lac acid Critical Value: 2.1  Name of Provider Notified: pickering  Orders Received? Or Actions Taken?: see chart

## 2019-01-25 NOTE — Progress Notes (Signed)
Peripherally Inserted Central Catheter/Midline Placement  The IV Nurse has discussed with the patient and/or persons authorized to consent for the patient, the purpose of this procedure and the potential benefits and risks involved with this procedure.  The benefits include less needle sticks, lab draws from the catheter, and the patient may be discharged home with the catheter. Risks include, but not limited to, infection, bleeding, blood clot (thrombus formation), and puncture of an artery; nerve damage and irregular heartbeat and possibility to perform a PICC exchange if needed/ordered by physician.  Alternatives to this procedure were also discussed.  Bard Power PICC patient education guide, fact sheet on infection prevention and patient information card has been provided to patient /or left at bedside.    PICC/Midline Placement Documentation  PICC Double Lumen 01/25/19 PICC Right Basilic 44 cm 0 cm (Active)  Indication for Insertion or Continuance of Line Prolonged intravenous therapies 01/25/2019  5:35 PM  Exposed Catheter (cm) 0 cm 01/25/2019  5:35 PM  Site Assessment Clean;Dry;Intact 01/25/2019  5:35 PM  Lumen #1 Status Flushed;Blood return noted;Saline locked 01/25/2019  5:35 PM  Lumen #2 Status Flushed;Blood return noted;Saline locked 01/25/2019  5:35 PM  Dressing Type Transparent;Securing device 01/25/2019  5:35 PM  Dressing Status Clean;Dry;Intact;Antimicrobial disc in place 01/25/2019  5:35 PM  Dressing Change Due 02/01/19 01/25/2019  5:35 PM       Romie Jumper 01/25/2019, 5:41 PM

## 2019-01-25 NOTE — H&P (Signed)
History and Physical  Jeff Ray OIN:[id number] DOB: June 29, 1956 DOA: 01/25/2019  Referring physician: Rubin Payor MD  PCP: Gareth Morgan, MD   Chief Complaint: SOB   HPI: Jeff Ray is a 63 y.o. male smoker with home oxygen dependent COPD was recently discharged from [facility] from a prolonged hospitalization for respiratory failure requiring intubation in the ICU, extubated on 01/10/2019 and discharged 01/12/2019 on a prednisone taper.  He was diagnosed with a pneumonia at that time and was in diabetic ketoacidosis with a hemoglobin A1c near 12%.  His coronavirus testing was negative at the time.  He showed improvement on IV steroids and bronchodilators and was eventually weaned down to 3 L of oxygen and discharged home on a prednisone taper with home health PT OT and RN.  He apparently went home and has had progressive shortness of breath.  He was brought into the hospital by EMS for symptoms of chest pain and shortness of breath.  He was noted to have a pulse ox of 84% on 4 L/min.  He complained of severe shortness of breath.  He was placed on a nonrebreather by EMS.  When he arrived in the emergency department he subsequently placed on high flow nasal cannula oxygen and subsequently placed on BiPAP.  He had repeat COVID-19 testing done that was again negative.  He was started on broad-spectrum antibiotics for presumed nosocomial pneumonia.  His lactic acid was elevated at 2.3.  He was bolused IV fluids.  His WBC was markedly elevated at 30.7 however he had been on a steroid taper.  He was noted to have a left upper lobe pneumonia on chest x-ray and fibrotic changes were seen.  His ABG revealed a pH of 7.452, PCO2 48.9, PO2 59.7.  His blood glucose was 161.  Sodium was 133.  Potassium was 3.9.  His anion gap was 14.  The patient is being admitted to the ICU for high risk of decompensation and possible intubation.  The patient is critically ill at this time.  Review of Systems: Unable to  obtain as patient is on BiPAP and critically ill.   Past Medical History:  Diagnosis Date   Anxiety    Asthma    COPD (chronic obstructive pulmonary disease) (HCC)    Diabetes mellitus    History of nuclear stress test 02/2016   low risk study   Hypertension    On home O2    2L N/C    Pneumonia    Past Surgical History:  Procedure Laterality Date   BACK SURGERY     Social History:  reports that he quit smoking about 6 years ago. His smoking use included cigarettes. He smoked 0.00 packs per day for 10.00 years. He has never used smokeless tobacco. He reports that he does not drink alcohol or use drugs.  No Known Allergies  Family History  Problem Relation Age of Onset   Diabetes Mother     Prior to Admission medications   Medication Sig Start Date End Date Taking? Authorizing Provider  albuterol (PROAIR HFA) 108 (90 BASE) MCG/ACT inhaler Inhale 2 puffs into the lungs every 6 (six) hours as needed for wheezing or shortness of breath. Shortness of Breath    [provider]  ALPRAZolam (XANAX) 1 MG tablet Take 0.5 tablets (0.5 mg total) by mouth 2 (two) times daily as needed for anxiety. 01/19/19   Zannie Cove, MD  fexofenadine-pseudoephedrine (ALLEGRA-D 24) 180-240 MG 24 hr tablet Take 1 tablet by mouth  daily.    [provider]  fluticasone (FLONASE) 50 MCG/ACT nasal spray Place 2 sprays into both nostrils daily. 09/30/17   [provider]  glimepiride (AMARYL) 2 MG tablet Take 2 mg by mouth daily with breakfast.    [provider]  Guaifenesin (MUCINEX MAXIMUM STRENGTH) 1200 MG TB12 Take 1 tablet (1,200 mg total) by mouth 2 (two) times daily. 12/11/18   Shon Hale, MD  Insulin Glargine (LANTUS) 100 UNIT/ML Solostar Pen Inject 30 Units into the skin daily. 01/19/19   Zannie Cove, MD  ipratropium-albuterol (DUONEB) 0.5-2.5 (3) MG/3ML SOLN Take 3 mLs by nebulization every 4 (four) hours as needed. For shortness of breath     [provider]  metFORMIN (GLUCOPHAGE-XR) 500 MG 24 hr tablet Take 500 mg by mouth 2 (two) times daily.     [provider]  montelukast (SINGULAIR) 10 MG tablet Take 10 mg by mouth every morning.     [provider]  Multiple Vitamins-Minerals (CENTRUM SILVER 50+MEN PO) Take 1 tablet by mouth daily.    [provider]  ondansetron (ZOFRAN) 4 MG tablet Take 1 tablet (4 mg total) by mouth every 6 (six) hours as needed for nausea. Patient not taking: Reported on 01/09/2019 12/11/18   Shon Hale, MD  oxybutynin (DITROPAN) 5 MG tablet Take 1 tablet (5 mg total) by mouth 2 (two) times daily. 01/19/19   Zannie Cove, MD  pantoprazole (PROTONIX) 40 MG tablet Take 40 mg by mouth every morning.     [provider]  predniSONE (DELTASONE) 20 MG tablet Take 1-3 tablets (20-60 mg total) by mouth daily with breakfast. Take  daily for 2days then  daily for 2 days then  daily for 2days then STOP 01/19/19   Zannie Cove, MD  rosuvastatin (CRESTOR) 40 MG tablet Take 40 mg by mouth at bedtime.  07/24/17   [provider]  TRELEGY ELLIPTA 100-62.5-25 MCG/INH AEPB Take 2 puffs by mouth daily. 11/17/18   [provider]   Physical Exam: Vitals:   01/25/19 1030 01/25/19 1100 01/25/19 1232 01/25/19 1233  BP: 124/67 (!) 117/92    Pulse:    (!) 102  Resp: (!) 43 (!) 44  (!) 26  Temp:      TempSrc:      SpO2:   100% 100%  Weight:      Height:        General exam: Moderately built and nourished patient, lying comfortably supine on the gurney in no obvious distress.  Head, eyes and ENT: Nontraumatic and normocephalic. Pupils equally reacting to light and accommodation. Oral mucosa dry.  Neck: Supple. No JVD, carotid bruit or thyromegaly.  Lymphatics: No lymphadenopathy.  Respiratory system: poor air movement. Moderate to severe increased work of breathing. Tachypneic.   Cardiovascular system: tachycardic S1 and S2 heard, RRR. No  JVD, murmurs, gallops, clicks or pedal edema.  Gastrointestinal system: Abdomen is nondistended, soft and nontender. Normal bowel sounds heard. No organomegaly or masses appreciated.  Central nervous system: Alert and oriented. No focal neurological deficits.  Extremities: Symmetric 5 x 5 power. Peripheral pulses symmetrically felt.   Skin: No rashes or acute findings.  Musculoskeletal system: Negative exam.  Psychiatry: Pleasant and cooperative.  Labs on Admission:  Basic Metabolic Panel: Recent Labs  Lab 01/19/19 0512 01/25/19 0757  NA 135 133*  K 4.7 3.9  CL 92* 89*  CO2 35* 30  GLUCOSE 159* 161*  BUN 22 15  CREATININE 0.95 0.94  CALCIUM 10.0  9.9   Liver Function Tests: Recent Labs  Lab 01/25/19 0757  AST 18  ALT 42  ALKPHOS 152*  BILITOT 0.6  PROT 7.7  ALBUMIN 2.8*   No results for input(s): LIPASE, AMYLASE in the last 168 hours. No results for input(s): AMMONIA in the last 168 hours. CBC: Recent Labs  Lab 01/19/19 0512 01/25/19 0757  WBC 20.1* 30.7*  NEUTROABS  --  26.4*  HGB 11.1* 14.4  HCT 36.5* 46.9  MCV 87.1 87.0  PLT 294 304   Cardiac Enzymes: No results for input(s): CKTOTAL, CKMB, CKMBINDEX, TROPONINI in the last 168 hours.  BNP (last 3 results) No results for input(s): PROBNP in the last 8760 hours. CBG: Recent Labs  Lab 01/18/19 1630 01/18/19 2130 01/19/19 0746 01/19/19 1200  GLUCAP 167* 196* 210* 269*   Radiological Exams on Admission: Dg Chest Portable 1 View  Result Date: 01/25/2019 CLINICAL DATA:  Acute on chronic respiratory failure. EXAM: PORTABLE CHEST 1 VIEW COMPARISON:  Single-view of the chest 01/16/2019, 01/15/2019 and 10/27/2018. FINDINGS: The lungs are emphysematous. Increased airspace disease is seen in the left upper lobe. There is basilar fibrosis, worse on the left. Heart size is normal. No pneumothorax. IMPRESSION: Increased left upper lobe airspace opacity worrisome for pneumonia superimposed on scar. Emphysema  and basilar fibrosis, worse on the left. Electronically Signed   By: Drusilla Kanner M.D.   On: 01/25/2019 07:48   Assessment/Plan Principal Problem:   Acute and chronic respiratory failure (acute-on-chronic) (HCC) Active Problems:   Acute on chronic respiratory failure with hypercapnia (HCC)   COPD exacerbation (HCC)   HTN (hypertension)   DM (diabetes mellitus) (HCC)   Tobacco abuse   HLD (hyperlipidemia)   GERD (gastroesophageal reflux disease)   Anxiety   Chronic respiratory failure with hypoxia (HCC)   Uncontrolled type 2 diabetes mellitus with hyperglycemia (HCC)   COPD with acute exacerbation (HCC)   HCAP (healthcare-associated pneumonia)   On home O2   Elevated lactic acid level   1. Acute on chronic respiratory failure- patient is high risk for intubation.  The patient is critically ill at this time.  He is currently maintaining oxygenation on BiPAP therapy.  He is experiencing exacerbation of COPD likely secondary to nosocomial pneumonia.  His COVID-19 testing has been negative.  Respiratory virus panel is pending.  He is going to be treated for HCAP and COPD exacerbation.  The patient has had multiple recent admissions for respiratory failure and I think that a palliative medicine consult may benefit patient.  2. HCAP -he has been started on vancomycin and cefepime.  Follow cultures.  Continue supportive therapy. 3. COPD exacerbation-the patient does have significant advanced end-stage disease and high risk for intubation.  Because of this I am consulting the inpatient pulmonology team to follow along with his care.  IV steroids and IV antibiotics ordered.  Schedule DuoNeb treatments ordered. 4. Insulin requiring diabetes mellitus with hyperglycemia-we anticipate hyperglycemia associated with IV steroid use.  We have increase his basal insulin and have provided every 4 hour sliding scale coverage.  If we cannot control his blood sugars with this regimen we will start an IV  insulin infusion.  Add meal coverage when patient is no longer on n.p.o. status. 5. GAD-we resumed his home alprazolam as needed. 6. Sepsis secondary to nosocomial pneumonia- he has been bolused with IV fluids and we are following his lactic acid levels.  Procalcitonin pending.  His hemodynamics are holding stable but we will follow closely in the  ICU. 7. Essential hypertension-his blood pressures are soft and we are subsequently holding any antihypertensives at this time. 8. Tobacco abuse-the patient absolutely has to stop all tobacco use as he likely will not survive long if he continues.  Nicotine patch will be provided as needed for nicotine cravings while in the hospital. 9. GERD-Protonix ordered for GI protection.  DVT Prophylaxis: Lovenox Code Status: Full Family Communication: Wife Disposition Plan: ICU inpatient  Critical Care Time spent: 55 minutes   Kylie Gros Laural BenesJohnson, MD [facility] How to contact the [facility] Attending or Consulting provider 7A - 7P or covering provider during after hours 7P -7A, for this patient?  1. Check the care team in [facility] and look for a) attending/consulting [facility] provider listed and b) the [facility] team listed 2. Log into [email or web address] and use [facility] universal password to access. If you do not have the password, please contact the hospital operator. 3. Locate the [facility] provider you are looking for under [facility] and page to a number that you can be directly reached. 4. If you still have difficulty reaching the provider, please page the [facility] (Director on Call) for the Hospitalists listed on amion for assistance.

## 2019-01-25 NOTE — ED Triage Notes (Addendum)
Pt brought in by EMS. Original call was for CP. Sats were 84% on 4 L/min via. Pt SOB .NRB placed by EMS Recently dx with pneumonia and discharged from hospital on 01/19/19. Negative for covid x2 test

## 2019-01-25 NOTE — ED Notes (Signed)
Dr Pickering at bedside 

## 2019-01-25 NOTE — Progress Notes (Signed)
Pharmacy Antibiotic Note  Jeff Ray is a 63 y.o. male admitted on 01/25/2019 with pneumonia.  Pharmacy has been consulted for Vancomycin and Meropenem dosing.  Plan: Vancomycin 750 mg IV every 12 hours.  Goal trough 15-20 mcg/mL.  Meropenem 1000 mg IV every 8 hours. Monitor labs, c/s, and vanco levels as indicated.  Height: 5\' 9"  (175.3 cm) Weight: 158 lb 4.6 oz (71.8 kg) IBW/kg (Calculated) : 70.7  Temp (24hrs), Avg:100.9 F (38.3 C), Min:100.9 F (38.3 C), Max:100.9 F (38.3 C)  Recent Labs  Lab 01/19/19 0512 01/25/19 0757  WBC 20.1* 30.7*  CREATININE 0.95 0.94  LATICACIDVEN  --  2.1*    Estimated Creatinine Clearance: 79.4 mL/min (by C-G formula based on SCr of 0.94 mg/dL).    No Known Allergies  Antimicrobials this admission: Merrem 4/23 >>  Vanco 4/23 >>   Dose adjustments this admission: N/A  Microbiology results: 4/23 BCx: pending  4/23 MRSA PCR: pending  Thank you for allowing pharmacy to be a part of this patient's care.  Tad Moore 01/25/2019 10:28 AM

## 2019-01-26 ENCOUNTER — Inpatient Hospital Stay (HOSPITAL_COMMUNITY): Payer: Medicare HMO

## 2019-01-26 DIAGNOSIS — Z9981 Dependence on supplemental oxygen: Secondary | ICD-10-CM

## 2019-01-26 DIAGNOSIS — I10 Essential (primary) hypertension: Secondary | ICD-10-CM

## 2019-01-26 LAB — CBC WITH DIFFERENTIAL/PLATELET
Abs Immature Granulocytes: 0.27 10*3/uL — ABNORMAL HIGH (ref 0.00–0.07)
Basophils Absolute: 0.1 10*3/uL (ref 0.0–0.1)
Basophils Relative: 0 %
Eosinophils Absolute: 0 10*3/uL (ref 0.0–0.5)
Eosinophils Relative: 0 %
HCT: 35.3 % — ABNORMAL LOW (ref 39.0–52.0)
Hemoglobin: 10.6 g/dL — ABNORMAL LOW (ref 13.0–17.0)
Immature Granulocytes: 1 %
Lymphocytes Relative: 1 %
Lymphs Abs: 0.4 10*3/uL — ABNORMAL LOW (ref 0.7–4.0)
MCH: 26.6 pg (ref 26.0–34.0)
MCHC: 30 g/dL (ref 30.0–36.0)
MCV: 88.5 fL (ref 80.0–100.0)
Monocytes Absolute: 0.3 10*3/uL (ref 0.1–1.0)
Monocytes Relative: 1 %
Neutro Abs: 31.1 10*3/uL — ABNORMAL HIGH (ref 1.7–7.7)
Neutrophils Relative %: 97 %
Platelets: 233 10*3/uL (ref 150–400)
RBC: 3.99 MIL/uL — ABNORMAL LOW (ref 4.22–5.81)
RDW: 14.5 % (ref 11.5–15.5)
WBC: 32.2 10*3/uL — ABNORMAL HIGH (ref 4.0–10.5)
nRBC: 0 % (ref 0.0–0.2)

## 2019-01-26 LAB — GLUCOSE, CAPILLARY
Glucose-Capillary: 233 mg/dL — ABNORMAL HIGH (ref 70–99)
Glucose-Capillary: 249 mg/dL — ABNORMAL HIGH (ref 70–99)
Glucose-Capillary: 395 mg/dL — ABNORMAL HIGH (ref 70–99)
Glucose-Capillary: 148 mg/dL — ABNORMAL HIGH (ref 70–99)
Glucose-Capillary: 100 mg/dL — ABNORMAL HIGH (ref 70–99)

## 2019-01-26 LAB — COMPREHENSIVE METABOLIC PANEL
ALT: 30 U/L (ref 0–44)
AST: 12 U/L — ABNORMAL LOW (ref 15–41)
Albumin: 1.9 g/dL — ABNORMAL LOW (ref 3.5–5.0)
Alkaline Phosphatase: 133 U/L — ABNORMAL HIGH (ref 38–126)
Anion gap: 9 (ref 5–15)
BUN: 16 mg/dL (ref 8–23)
CO2: 26 mmol/L (ref 22–32)
Calcium: 8.9 mg/dL (ref 8.9–10.3)
Chloride: 98 mmol/L (ref 98–111)
Creatinine, Ser: 0.73 mg/dL (ref 0.61–1.24)
GFR calc Af Amer: >60 mL/min (ref >60)
GFR calc non Af Amer: >60 mL/min (ref >60)
Glucose, Bld: 252 mg/dL — ABNORMAL HIGH (ref 70–99)
Potassium: 4.4 mmol/L (ref 3.5–5.1)
Sodium: 133 mmol/L — ABNORMAL LOW (ref 135–145)
Total Bilirubin: 0.5 mg/dL (ref 0.3–1.2)
Total Protein: 5.7 g/dL — ABNORMAL LOW (ref 6.5–8.1)

## 2019-01-26 LAB — RESPIRATORY PANEL BY PCR

## 2019-01-26 LAB — BRAIN NATRIURETIC PEPTIDE: B Natriuretic Peptide: 187 pg/mL — ABNORMAL HIGH (ref 0.0–100.0)

## 2019-01-26 LAB — PROCALCITONIN: Procalcitonin: 0.3 ng/mL

## 2019-01-26 LAB — MAGNESIUM: Magnesium: 2.1 mg/dL (ref 1.7–2.4)

## 2019-01-26 MED ORDER — INSULIN ASPART 100 UNIT/ML ~~LOC~~ SOLN
10.0000 [IU] | Freq: Three times a day (TID) | SUBCUTANEOUS | Status: DC
  Administered 2019-01-26 – 2019-01-31 (×17): 10 [IU] via SUBCUTANEOUS

## 2019-01-26 MED ORDER — INSULIN GLARGINE 100 UNIT/ML ~~LOC~~ SOLN
40.0000 [IU] | Freq: Every day | SUBCUTANEOUS | Status: DC
  Administered 2019-01-26 – 2019-02-01 (×7): 40 [IU] via SUBCUTANEOUS
  Filled 2019-01-26 (×8): qty 0.4

## 2019-01-26 MED ORDER — DOXYCYCLINE HYCLATE 100 MG PO TABS
100.0000 mg | ORAL_TABLET | Freq: Two times a day (BID) | ORAL | Status: AC
  Administered 2019-01-26 – 2019-01-30 (×10): 100 mg via ORAL
  Filled 2019-01-26 (×10): qty 1

## 2019-01-26 NOTE — Progress Notes (Signed)
PROGRESS NOTE    Jeff Ray  ZOX:[id number]  DOB: [birth year]  DOA: 01/25/2019 PCP: Gareth Morgan, MD   Brief Admission Hx: 63 year old smoker with home oxygen dependent COPD recently discharged from [facility] for pneumonia and COPD exacerbation had been doing fairly well at home but suddenly developed acute shortness of breath and hypoxia.  He was admitted with acute on chronic respiratory failure.  MDM/Assessment & Plan:   1. Acute on chronic respiratory failure-suspect secondary to COPD exacerbation.  He seems to be responding very well to treatment.  He is off BiPAP now but it is standing by as he does have high risk for decompensation.  He remains on high flow nasal cannula.  He reports that he is breathing much better.  Plan to de-escalate antibiotics today. 2. COPD with acute exacerbation- he is on IV steroids antibiotics and neb treatments.  He reports that he is breathing better today.  He has been weaned off BiPAP and now on high flow nasal cannula and remains in the stepdown unit. 3. Insulin requiring diabetes mellitus with hyperglycemia-he is having some hyperglycemia associated with steroids however he never received his Lantus dose yesterday.  I have reordered for him to receive Lantus insulin this morning.  We have ordered prandial NovoLog coverage and supplemental sliding scale coverage.  Continue CBG testing 5 times per day. 4. GAD- resumed home alprazolam as needed. 5. Sepsis-has resolved with treatments. 6. Essential hypertension- his blood pressures have been soft and he is not on any antihypertensives at this time. 7. Tobacco abuse-nicotine patch provided.  He has been counseled on smoking cessation.  He verbalizes understanding. 8. GERD-Protonix ordered for GI protection.  DVT prophylaxis: Lovenox Code Status: Full Family Communication: Wife telephone Disposition Plan: Moved to stepdown unit  Consultants:  Pulmonology Dr. Juanetta Gosling  Procedures:     Antimicrobials:  Meropenem 4/23-4/24  Vancomycin 4/23-4/24  Doxycycline 4/24 -   Subjective: The patient is on high flow nasal cannula but is speaking and alert and says that he feels better.  He is now breathing is hardest yesterday.  He says that he was feeling well just up until he became acutely short of breath yesterday.  He said he had not resumed smoking since he was discharged from [facility].  Objective: Vitals:   01/26/19 0428 01/26/19 0500 01/26/19 0600 01/26/19 0745  BP:  124/78 120/73   Pulse:  (!) 102 78   Resp:  15 (!) 28   Temp:    (!) 97.4 F (36.3 C)  TempSrc:    Oral  SpO2: 93% 92% 92%   Weight:  74.1 kg    Height:        Intake/Output Summary (Last 24 hours) at 01/26/2019 0953 Last data filed at 01/26/2019 0500 Gross per 24 hour  Intake 2550.07 ml  Output 575 ml  Net 1975.07 ml   Filed Weights   01/25/19 0716 01/25/19 1700 01/26/19 0500  Weight: 71.8 kg 73.4 kg 74.1 kg     REVIEW OF SYSTEMS  As per history otherwise all reviewed and reported negative  Exam:  General exam: Chronically ill-appearing male he is lying in the bed he is on high flow nasal cannula he is in no apparent distress.  He is cooperating for examination. Respiratory system: Diffuse inspiratory and expiratory wheezing. No increased work of breathing. Cardiovascular system: S1 & S2 heard. No JVD, murmurs, gallops, clicks or pedal edema. Gastrointestinal system: Abdomen is nondistended, soft and nontender. Normal bowel sounds heard. Central  nervous system: Alert and oriented. No focal neurological deficits. Extremities: no CCE.  Data Reviewed: Basic Metabolic Panel: Recent Labs  Lab 01/25/19 0757 01/26/19 0542  NA 133* 133*  K 3.9 4.4  CL 89* 98  CO2 30 26  GLUCOSE 161* 252*  BUN 15 16  CREATININE 0.94 0.73  CALCIUM 9.9 8.9  MG  --  2.1   Liver Function Tests: Recent Labs  Lab 01/25/19 0757 01/26/19 0542  AST 18 12*  ALT 42 30  ALKPHOS 152* 133*  BILITOT 0.6  0.5  PROT 7.7 5.7*  ALBUMIN 2.8* 1.9*   No results for input(s): LIPASE, AMYLASE in the last 168 hours. No results for input(s): AMMONIA in the last 168 hours. CBC: Recent Labs  Lab 01/25/19 0757 01/26/19 0542  WBC 30.7* 32.2*  NEUTROABS 26.4* 31.1*  HGB 14.4 10.6*  HCT 46.9 35.3*  MCV 87.0 88.5  PLT 304 233   Cardiac Enzymes: No results for input(s): CKTOTAL, CKMB, CKMBINDEX, TROPONINI in the last 168 hours. CBG (last 3)  Recent Labs    01/25/19 2352 01/26/19 0447 01/26/19 0745  GLUCAP 240* 233* 249*   Recent Results (from the past 240 hour(s))  SARS Coronavirus 2 [facility](Hospital order, Performed in [facility] lab)     Status: None   Collection Time: 01/16/19  1:10 PM  Result Value Ref Range Status   SARS Coronavirus 2 NEGATIVE NEGATIVE Final    Comment: (NOTE) If result is NEGATIVE SARS-CoV-2 target nucleic acids are NOT DETECTED. The SARS-CoV-2 RNA is generally detectable in upper and lower  respiratory specimens during the acute phase of infection. The lowest  concentration of SARS-CoV-2 viral copies this assay can detect is 250  copies / mL. A negative result does not preclude SARS-CoV-2 infection  and should not be used as the sole basis for treatment or other  patient management decisions.  A negative result may occur with  improper specimen collection / handling, submission of specimen other  than nasopharyngeal swab, presence of viral mutation(s) within the  areas targeted by this assay, and inadequate number of viral copies  (<250 copies / mL). A negative result must be combined with clinical  observations, patient history, and epidemiological information. If result is POSITIVE SARS-CoV-2 target nucleic acids are DETECTED. The SARS-CoV-2 RNA is generally detectable in upper and lower  respiratory specimens dur ing the acute phase of infection.  Positive  results are indicative of active infection with SARS-CoV-2.  Clinical  correlation with patient  history and other diagnostic information is  necessary to determine patient infection status.  Positive results do  not rule out bacterial infection or co-infection with other viruses. If result is PRESUMPTIVE POSTIVE SARS-CoV-2 nucleic acids MAY BE PRESENT.   A presumptive positive result was obtained on the submitted specimen  and confirmed on repeat testing.  While 2019 novel coronavirus  (SARS-CoV-2) nucleic acids may be present in the submitted sample  additional confirmatory testing may be necessary for epidemiological  and / or clinical management purposes  to differentiate between  SARS-CoV-2 and other Sarbecovirus currently known to infect humans.  If clinically indicated additional testing with an alternate test  methodology [phone]([id number]) is advised. The SARS-CoV-2 RNA is generally  detectable in upper and lower respiratory sp ecimens during the acute  phase of infection. The expected result is Negative. Fact Sheet for Patients:  BoilerBrush.com.cyhttps://www.fda.gov/media/136312/download Fact Sheet for Healthcare Providers: https://pope.com/https://www.fda.gov/media/136313/download This test is not yet approved or cleared by the [place] FDA and  has been authorized for detection and/or diagnosis of SARS-CoV-2 by FDA under an Emergency Use Authorization (EUA).  This EUA will remain in effect (meaning this test can be used) for the duration of the COVID-19 declaration under Section 564(b)(1) of the Act, 21 U.S.C. section 360bbb-3(b)(1), unless the authorization is terminated or revoked sooner. Performed at [facility], [address], [place], [place] [address]   Blood Culture (routine x 2)     Status: None (Preliminary result)   Collection Time: 01/25/19  7:58 AM  Result Value Ref Range Status   Specimen Description RIGHT ANTECUBITAL  Final   Special Requests   Final    BOTTLES DRAWN AEROBIC AND ANAEROBIC Blood Culture adequate volume   Culture   Final    NO GROWTH < 24 HOURS Performed at [facility], [address] [place], [place] [address]    Report Status PENDING  Incomplete  Blood Culture (routine x 2)     Status: None (Preliminary result)   Collection Time: 01/25/19  9:01 AM  Result Value Ref Range Status   Specimen Description RIGHT ANTECUBITAL  Final   Special Requests   Final    BOTTLES DRAWN AEROBIC AND ANAEROBIC Blood Culture adequate volume   Culture   Final    NO GROWTH < 24 HOURS Performed at [facility], [address], [place], [place] [address]    Report Status PENDING  Incomplete  SARS Coronavirus 2 [facility] order, Performed in [facility] hospital lab)     Status: None   Collection Time: 01/25/19 10:51 AM  Result Value Ref Range Status   SARS Coronavirus 2 NEGATIVE NEGATIVE Final    Comment: (NOTE) If result is NEGATIVE SARS-CoV-2 target nucleic acids are NOT DETECTED. The SARS-CoV-2 RNA is generally detectable in upper and lower  respiratory specimens during the acute phase of infection. The lowest  concentration of SARS-CoV-2 viral copies this assay can detect is 250  copies / mL. A negative result does not preclude SARS-CoV-2 infection  and should not be used as the sole basis for treatment or other  patient management decisions.  A negative result may occur with  improper specimen collection / handling, submission of specimen other  than nasopharyngeal swab, presence of viral mutation(s) within the  areas targeted by this assay, and inadequate number of viral copies  (<250 copies / mL). A negative result must be combined with clinical  observations, patient history, and epidemiological information. If result is POSITIVE SARS-CoV-2 target nucleic acids are DETECTED. The SARS-CoV-2 RNA is generally detectable in upper and lower  respiratory specimens dur ing the acute phase of infection.  Positive  results are indicative of active infection with SARS-CoV-2.  Clinical  correlation with patient history and other diagnostic information is   necessary to determine patient infection status.  Positive results do  not rule out bacterial infection or co-infection with other viruses. If result is PRESUMPTIVE POSTIVE SARS-CoV-2 nucleic acids MAY BE PRESENT.   A presumptive positive result was obtained on the submitted specimen  and confirmed on repeat testing.  While 2019 novel coronavirus  (SARS-CoV-2) nucleic acids may be present in the submitted sample  additional confirmatory testing may be necessary for epidemiological  and / or clinical management purposes  to differentiate between  SARS-CoV-2 and other Sarbecovirus currently known to infect humans.  If clinically indicated additional testing with an alternate test  methodology [phone]) is advised. The SARS-CoV-2 RNA is generally  detectable in upper and lower  respiratory sp ecimens during the acute  phase of infection. The expected result is Negative. Fact Sheet for Patients:  BoilerBrush.com.cy Fact Sheet for Healthcare Providers: https://pope.com/ This test is not yet approved or cleared by the [place] FDA and has been authorized for detection and/or diagnosis of SARS-CoV-2 by FDA under an Emergency Use Authorization (EUA).  This EUA will remain in effect (meaning this test can be used) for the duration of the COVID-19 declaration under Section 564(b)(1) of the Act, 21 U.S.C. section 360bbb-3(b)(1), unless the authorization is terminated or revoked sooner. Performed at [facility], [address] [place], [place] [address]     Studies: Portable Chest 1 View  Result Date: 01/26/2019 CLINICAL DATA:  63 year old male with respiratory failure. Negative for COVID-19. EXAM: PORTABLE CHEST 1 VIEW COMPARISON:  01/25/2019 and earlier. FINDINGS: Portable AP upright view at 0523 hours. Underlying large lung volumes. Stable cardiac size and mediastinal contours. The right lung appears clear, with evidence of upper lung  emphysema. Right PICC line has been placed and tip is at the level of the lower SVC. Patchy and nodular left upper lung opacity has mildly regressed since yesterday. Stable left lung ventilation elsewhere. No pneumothorax or pleural effusion. IMPRESSION: 1. Right PICC line placed, tip at the lower SVC level. 2. Evidence of emphysema. Left upper lung opacity mildly regressed since yesterday. No new cardiopulmonary abnormality. Electronically Signed   By: Odessa Fleming M.D.   On: 01/26/2019 06:04   Dg Chest Portable 1 View  Result Date: 01/25/2019 CLINICAL DATA:  Acute on chronic respiratory failure. EXAM: PORTABLE CHEST 1 VIEW COMPARISON:  Single-view of the chest 01/16/2019, 01/15/2019 and 10/27/2018. FINDINGS: The lungs are emphysematous. Increased airspace disease is seen in the left upper lobe. There is basilar fibrosis, worse on the left. Heart size is normal. No pneumothorax. IMPRESSION: Increased left upper lobe airspace opacity worrisome for pneumonia superimposed on scar. Emphysema and basilar fibrosis, worse on the left. Electronically Signed   By: Drusilla Kanner M.D.   On: 01/25/2019 07:48   [place] Ekg Site Rite  Result Date: 01/25/2019 If Site Rite image not attached, placement could not be confirmed due to current cardiac rhythm.  Scheduled Meds: . Chlorhexidine Gluconate Cloth  6 each Topical Daily  . enoxaparin (LOVENOX) injection  40 mg Subcutaneous Q24H  . fluticasone  2 spray Each Nare Daily  . insulin aspart  0-20 Units Subcutaneous TID WC  . insulin aspart  10 Units Subcutaneous TID WC  . insulin glargine  40 Units Subcutaneous Daily  . ipratropium-albuterol  3 mL Nebulization Q4H  . methylPREDNISolone (SOLU-MEDROL) injection  40 mg Intravenous Q4H  . montelukast  10 mg Oral QHS  . oxybutynin  5 mg Oral BID  . pantoprazole  40 mg Oral q morning - 10a  . rosuvastatin  40 mg Oral QHS  . sodium chloride flush  10-40 mL Intracatheter Q12H   Continuous Infusions: . meropenem (MERREM)  IV 1 g (01/26/19 0933)  . vancomycin Stopped (01/25/19 2315)    Principal Problem:   Acute and chronic respiratory failure (acute-on-chronic) (HCC) Active Problems:   Acute on chronic respiratory failure with hypercapnia (HCC)   COPD exacerbation (HCC)   HTN (hypertension)   DM (diabetes mellitus) (HCC)   Tobacco abuse   HLD (hyperlipidemia)   GERD (gastroesophageal reflux disease)   Anxiety   Chronic respiratory failure with hypoxia (HCC)   Uncontrolled type 2 diabetes mellitus with hyperglycemia (HCC)   COPD with acute exacerbation (HCC)  HCAP (healthcare-associated pneumonia)   On home O2   Elevated lactic acid level  Critical CareTime spent: 31 mins  Standley Dakins, MD [facility] 01/26/2019, 9:53 AM    LOS: 1 day  How to contact the [facility] Attending or Consulting provider 7A - 7P or covering provider during after hours 7P -7A, for this patient?  1. Check the care team in [facility] and look for a) attending/consulting TRH provider listed and b) the [facility] team listed 2. Log into [email or web address] and use [place]'s universal password to access. If you do not have the password, please contact the hospital operator. 3. Locate the [facility] provider you are looking for under [facility] and page to a number that you can be directly reached. 4. If you still have difficulty reaching the provider, please page the [facility] (Director on Call) for the Hospitalists listed on amion for assistance.

## 2019-01-26 NOTE — Progress Notes (Signed)
Patient appears at some time in Past had CPAP/BiPAP. He stated he did know where machine was but still has Bag he uses for personals. He wears oxygen at home, he presently is on 6 liters. Will try to understand what and if he needs BiPAP for home sleep.

## 2019-01-26 NOTE — TOC Initial Note (Signed)
Transition of Care [facility]) - Initial/Assessment Note    Patient Details  Name: Jeff Ray MRN: [id number] Date of Birth: 03/22/1956  [facility] CM/SW Contact:    Shade Flood, LCSW Phone Number: 01/26/2019, 12:31 PM  Clinical Narrative:                  Met with pt today to assess for TOC needs. Pt reports that he lives with his wife and two grandchildren at home. Pt states he was recently hospitalized at [facility] and discharged home with [place] RN and PT but he says he hadn't seen PT yet. Pt uses O2 continuously at home. He mobilizes with a power wheelchair and only walks very limited distances. Pt's [name] is accommodated for his wheelchair and pt drives himself to appointments and the pharmacy. Pt reports that he sleeps on the couch at home. He does have a walker and a cane at home. He has a ramped entrance to his one level home. Pt states that he bathes sitting down in his tub. He feels like he has enough DME at home though he states he is hoping to get a new power wheelchair and also possible a condom catheter system at home. Per pt, he has an appointment scheduled with his PCP for Monday where he was hoping to follow up on these requests. Updated PCP on pt's admission. Current appointment is for 1:40 on Monday. TOC to update PCP office on Monday if pt remains hospitalized at that time.  Updated Linda from [facility] on above. Will follow.  Expected Discharge Plan: [place] Barriers to Discharge: Continued Medical Work up   Patient Goals and CMS Choice        Expected Discharge Plan and Services Expected Discharge Plan: [place]       Living arrangements for the past 2 months: [place]: [place] ([place])        Prior Living Arrangements/Services Living arrangements for the past 2 months: [place] Lives with:: Spouse, Minor  Children((64 and 85 year old grandchildren)) Patient language and need for interpreter reviewed:: Yes        Need for Family Participation in Patient Care: Yes (Comment) Care giver support system in place?: Yes (comment) Current home services: Home RN, Home PT Criminal Activity/Legal Involvement Pertinent to Current Situation/Hospitalization: No - Comment as needed  Activities of Daily Living Home Assistive Devices/Equipment: Oxygen ADL Screening (condition at time of admission) Patient's cognitive ability adequate to safely complete daily activities?: Yes Is the patient deaf or have difficulty hearing?: No Does the patient have difficulty seeing, even when wearing glasses/contacts?: No Does the patient have difficulty concentrating, remembering, or making decisions?: No Patient able to express need for assistance with ADLs?: Yes Does the patient have difficulty dressing or bathing?: No Independently performs ADLs?: Yes (appropriate for developmental age) Does the patient have difficulty walking or climbing stairs?: Yes Weakness of Legs: Both Weakness of Arms/Hands: None  Permission Sought/Granted                  Emotional Assessment   Attitude/Demeanor/Rapport: Engaged Affect (typically observed): Accepting, Pleasant Orientation: : Oriented to Self, Oriented to Place, Oriented to  Time, Oriented to Situation Alcohol / Substance Use: Not Applicable Psych  Involvement: No (comment)  Admission diagnosis:  Acute and chronic respiratory failure (acute-on-chronic) (HCC) [J96.20] HCAP (healthcare-associated pneumonia) [J18.9] Acute on chronic respiratory failure with hypoxia and hypercapnia ([place]) [P11.21, J96.22] Patient Active Problem List   Diagnosis Date Noted  . Acute and chronic respiratory failure (acute-on-chronic) ([place]) 01/25/2019  . HCAP (healthcare-associated pneumonia) 01/25/2019  . On home O2 01/25/2019  . Elevated lactic acid level 01/25/2019  . Pneumonia  01/09/2019  . Acute metabolic encephalopathy 62/44/6950  . Diabetic ketoacidosis with coma associated with type 2 diabetes mellitus ([place])   . Acute respiratory failure with hypoxia ([place]) 10/28/2018  . COPD with acute exacerbation ([place]) 09/09/2018  . Chronic respiratory failure with hypoxia ([place]) 09/08/2018  . Uncontrolled type 2 diabetes mellitus with hyperglycemia ([place]) 09/08/2018  . Elevated troponin 09/08/2018  . SIRS (systemic inflammatory response syndrome) ([place]) 09/07/2018  . Asthma 09/07/2018  . HLD (hyperlipidemia) 09/07/2018  . GERD (gastroesophageal reflux disease) 09/07/2018  . Anxiety 09/07/2018  . BPH (benign prostatic hyperplasia) 09/07/2018  . Anemia 08/03/2015  . Acute on chronic respiratory failure with hypercapnia ([place]) 05/26/2013  . COPD exacerbation ([place]) 05/26/2013  . HTN (hypertension) 05/26/2013  . DM (diabetes mellitus) ([place]) 05/26/2013  . Tobacco abuse 05/26/2013   PCP:  Lemmie Evens, MD Pharmacy:   [facility], [place] [address] Phone: [phone] Fax: [phone]  [facility] [place], [place] [address] [place] [address] Phone: [phone] Fax: [phone]     Social Determinants of Health (SDOH) Interventions    Readmission Risk Interventions Readmission Risk Prevention Plan 01/18/2019  Transportation Screening Complete  HRI or [place] Complete  Social Work Consult for [place] Planning/Counseling Not Complete  SW consult not completed comments NA  Palliative Care Screening Not Applicable  Palliative Care Screening Not Complete Comments NA  Medication Review (RN Care Manager) Complete  Some recent data might be hidden

## 2019-01-26 NOTE — Consult Note (Signed)
Consult requested by: [facility] hospitalist, Dr. Laural Benes Consult requested for: Acute on chronic respiratory failure  HPI: This is a 63 year old who is known to have severe COPD and is on home oxygen.  He was discharged from [facility] on the 10th after having had a prolonged hospitalization with respiratory failure requiring intubation and mechanical ventilation.  He had pneumonia at that time and also had diabetic ketoacidosis.  Coronavirus testing was negative.  He eventually was better on steroids bronchodilators oxygen and was discharged home.  He says that since he was discharged he had markedly increased shortness of breath felt very bad and called for help.  He was brought in by EMS and at that time had pulse ox of about 84% on 4 L.  He had a nonrebreather then a high flow nasal cannula then he was placed on BiPAP.  He had repeat COVID-19 testing that again was negative.  Lactate was elevated.  He was started on antibiotics had left upper lobe pneumonia had elevation of PCO2 and he was hypoxic.  He was critically ill on admission but says he feels substantially better this morning.  He did use BiPAP overnight.  He is known to have severe COPD at baseline.  He just had these severe critical illness as noted.  He is coughing a little bit.  He is not sure if he has had any fever.  He is not having any chest pain.  He is not having any abdominal symptoms.  He is urinating okay.  Past Medical History:  Diagnosis Date  . Anxiety   . Asthma   . COPD (chronic obstructive pulmonary disease) (HCC)   . Diabetes mellitus   . History of nuclear stress test 02/2016   low risk study  . Hypertension   . On home O2    2L N/C   . Pneumonia      Family History  Problem Relation Age of Onset  . Diabetes Mother      Social History   Socioeconomic History  . Marital status: Married    Spouse name: Not on file  . Number of children: Not on file  . Years of education: Not on file  . Highest  education level: Not on file  Occupational History  . Not on file  Social Needs  . Financial resource strain: Not on file  . Food insecurity:    Worry: Not on file    Inability: Not on file  . Transportation needs:    Medical: Not on file    Non-medical: Not on file  Tobacco Use  . Smoking status: Former Smoker    Packs/day: 0.00    Years: 10.00    Pack years: 0.00    Types: Cigarettes    Last attempt to quit: 10/20/2012    Years since quitting: 6.2  . Smokeless tobacco: Never Used  Substance and Sexual Activity  . Alcohol use: No    Alcohol/week: 0.0 standard drinks  . Drug use: No  . Sexual activity: Yes  Lifestyle  . Physical activity:    Days per week: Not on file    Minutes per session: Not on file  . Stress: Not on file  Relationships  . Social connections:    Talks on phone: Not on file    Gets together: Not on file    Attends religious service: Not on file    Active member of club or organization: Not on file    Attends meetings of clubs  or organizations: Not on file    Relationship status: Not on file  Other Topics Concern  . Not on file  Social History Narrative  . Not on file     ROS: Except as mentioned 10 point review of systems is negative    Objective: Vital signs in last 24 hours: Temp:  [97.6 F (36.4 C)-98.3 F (36.8 C)] 98.3 F (36.8 C) (04/24 0400) Pulse Rate:  [34-116] 78 (04/24 0600) Resp:  [15-44] 28 (04/24 0600) BP: (99-132)/(52-92) 120/73 (04/24 0600) SpO2:  [80 %-100 %] 92 % (04/24 0600) FiO2 (%):  [50 %-60 %] 50 % (04/23 2016) Weight:  [73.4 kg-74.1 kg] 74.1 kg (04/24 0500) Weight change:     Intake/Output from previous day: 04/23 0701 - 04/24 0700 In: 2550.1 [IV Piggyback:2550.1] Out: 575 [Urine:575]  PHYSICAL EXAM Constitutional: He is awake and alert no acute distress.  Eyes: Pupils react EOMI.  Ears nose mouth and throat: His mucous membranes are moist.  Neck is supple.  Hearing is grossly normal.  Cardiovascular: His  heart is regular with normal heart sounds minimal tachycardia when he moves around.  Respiratory: He has increased respiratory effort and a respiratory rate of around 30.  He has bilateral rhonchi and wheezing.  Gastrointestinal: His abdomen is soft with no masses.  Musculoskeletal: His muscle strength appears to be normal in the upper and lower extremities bilaterally.  Neurological: No focal abnormalities.  Psychiatric: He is mildly anxious  Lab Results: Basic Metabolic Panel: Recent Labs    01/25/19 0757 01/26/19 0542  NA 133* 133*  K 3.9 4.4  CL 89* 98  CO2 30 26  GLUCOSE 161* 252*  BUN 15 16  CREATININE 0.94 0.73  CALCIUM 9.9 8.9  MG  --  2.1   Liver Function Tests: Recent Labs    01/25/19 0757 01/26/19 0542  AST 18 12*  ALT 42 30  ALKPHOS 152* 133*  BILITOT 0.6 0.5  PROT 7.7 5.7*  ALBUMIN 2.8* 1.9*   No results for input(s): LIPASE, AMYLASE in the last 72 hours. No results for input(s): AMMONIA in the last 72 hours. CBC: Recent Labs    01/25/19 0757 01/26/19 0542  WBC 30.7* 32.2*  NEUTROABS 26.4* 31.1*  HGB 14.4 10.6*  HCT 46.9 35.3*  MCV 87.0 88.5  PLT 304 233   Cardiac Enzymes: No results for input(s): CKTOTAL, CKMB, CKMBINDEX, TROPONINI in the last 72 hours. BNP: No results for input(s): PROBNP in the last 72 hours. D-Dimer: No results for input(s): DDIMER in the last 72 hours. CBG: Recent Labs    01/25/19 1258 01/25/19 1700 01/25/19 1943 01/25/19 2352 01/26/19 0447 01/26/19 0745  GLUCAP 173* 177* 196* 240* 233* 249*   Hemoglobin A1C: No results for input(s): HGBA1C in the last 72 hours. Fasting Lipid Panel: No results for input(s): CHOL, HDL, LDLCALC, TRIG, CHOLHDL, LDLDIRECT in the last 72 hours. Thyroid Function Tests: No results for input(s): TSH, T4TOTAL, FREET4, T3FREE, THYROIDAB in the last 72 hours. Anemia Panel: No results for input(s): VITAMINB12, FOLATE, FERRITIN, TIBC, IRON, RETICCTPCT in the last 72 hours. Coagulation: No  results for input(s): LABPROT, INR in the last 72 hours. Urine Drug Screen: Drugs of Abuse  No results found for: LABOPIA, COCAINSCRNUR, LABBENZ, AMPHETMU, THCU, LABBARB  Alcohol Level: No results for input(s): ETH in the last 72 hours. Urinalysis: Recent Labs    01/25/19 0743  COLORURINE YELLOW  LABSPEC 1.027  PHURINE 7.0  GLUCOSEU 150*  HGBUR NEGATIVE  BILIRUBINUR NEGATIVE  KETONESUR  NEGATIVE  PROTEINUR 30*  NITRITE NEGATIVE  LEUKOCYTESUR NEGATIVE   Misc. Labs:   ABGS: Recent Labs    01/25/19 0950  PHART 7.452*  PO2ART 59.7*  HCO3 32.3*     MICROBIOLOGY: Recent Results (from the past 240 hour(s))  SARS Coronavirus 2 [facility] order, Performed in [facility] hospital lab)     Status: None   Collection Time: 01/16/19  1:10 PM  Result Value Ref Range Status   SARS Coronavirus 2 NEGATIVE NEGATIVE Final    Comment: (NOTE) If result is NEGATIVE SARS-CoV-2 target nucleic acids are NOT DETECTED. The SARS-CoV-2 RNA is generally detectable in upper and lower  respiratory specimens during the acute phase of infection. The lowest  concentration of SARS-CoV-2 viral copies this assay can detect is 250  copies / mL. A negative result does not preclude SARS-CoV-2 infection  and should not be used as the sole basis for treatment or other  patient management decisions.  A negative result may occur with  improper specimen collection / handling, submission of specimen other  than nasopharyngeal swab, presence of viral mutation(s) within the  areas targeted by this assay, and inadequate number of viral copies  (<250 copies / mL). A negative result must be combined with clinical  observations, patient history, and epidemiological information. If result is POSITIVE SARS-CoV-2 target nucleic acids are DETECTED. The SARS-CoV-2 RNA is generally detectable in upper and lower  respiratory specimens dur ing the acute phase of infection.  Positive  results are indicative of active  infection with SARS-CoV-2.  Clinical  correlation with patient history and other diagnostic information is  necessary to determine patient infection status.  Positive results do  not rule out bacterial infection or co-infection with other viruses. If result is PRESUMPTIVE POSTIVE SARS-CoV-2 nucleic acids MAY BE PRESENT.   A presumptive positive result was obtained on the submitted specimen  and confirmed on repeat testing.  While 2019 novel coronavirus  (SARS-CoV-2) nucleic acids may be present in the submitted sample  additional confirmatory testing may be necessary for epidemiological  and / or clinical management purposes  to differentiate between  SARS-CoV-2 and other Sarbecovirus currently known to infect humans.  If clinically indicated additional testing with an alternate test  methodology [phone]) is advised. The SARS-CoV-2 RNA is generally  detectable in upper and lower respiratory sp ecimens during the acute  phase of infection. The expected result is Negative. Fact Sheet for Patients:  BoilerBrush.com.cy Fact Sheet for Healthcare Providers: https://pope.com/ This test is not yet approved or cleared by the [place] FDA and has been authorized for detection and/or diagnosis of SARS-CoV-2 by FDA under an Emergency Use Authorization (EUA).  This EUA will remain in effect (meaning this test can be used) for the duration of the COVID-19 declaration under Section 564(b)(1) of the Act, 21 U.S.C. section 360bbb-3(b)(1), unless the authorization is terminated or revoked sooner. Performed at [facility], [address] [place], [place] [address]   Blood Culture (routine x 2)     Status: None (Preliminary result)   Collection Time: 01/25/19  7:58 AM  Result Value Ref Range Status   Specimen Description RIGHT ANTECUBITAL  Final   Special Requests   Final    BOTTLES DRAWN AEROBIC AND ANAEROBIC Blood Culture adequate volume    Culture   Final    NO GROWTH < 24 HOURS Performed at [facility], [address] [place], [place] [address]    Report Status PENDING  Incomplete  Blood Culture (routine x 2)     Status: None (Preliminary result)   Collection Time: 01/25/19  9:01 AM  Result Value Ref Range Status   Specimen Description RIGHT ANTECUBITAL  Final   Special Requests   Final    BOTTLES DRAWN AEROBIC AND ANAEROBIC Blood Culture adequate volume   Culture   Final    NO GROWTH < 24 HOURS Performed at [facility], [address] [place], [place] [address]    Report Status PENDING  Incomplete  SARS Coronavirus 2 [facility] order, Performed in [facility] hospital lab)     Status: None   Collection Time: 01/25/19 10:51 AM  Result Value Ref Range Status   SARS Coronavirus 2 NEGATIVE NEGATIVE Final    Comment: (NOTE) If result is NEGATIVE SARS-CoV-2 target nucleic acids are NOT DETECTED. The SARS-CoV-2 RNA is generally detectable in upper and lower  respiratory specimens during the acute phase of infection. The lowest  concentration of SARS-CoV-2 viral copies this assay can detect is 250  copies / mL. A negative result does not preclude SARS-CoV-2 infection  and should not be used as the sole basis for treatment or other  patient management decisions.  A negative result may occur with  improper specimen collection / handling, submission of specimen other  than nasopharyngeal swab, presence of viral mutation(s) within the  areas targeted by this assay, and inadequate number of viral copies  (<250 copies / mL). A negative result must be combined with clinical  observations, patient history, and epidemiological information. If result is POSITIVE SARS-CoV-2 target nucleic acids are DETECTED. The SARS-CoV-2 RNA is generally detectable in upper and lower  respiratory specimens dur ing the acute phase of infection.  Positive  results are indicative of active infection with SARS-CoV-2.  Clinical  correlation  with patient history and other diagnostic information is  necessary to determine patient infection status.  Positive results do  not rule out bacterial infection or co-infection with other viruses. If result is PRESUMPTIVE POSTIVE SARS-CoV-2 nucleic acids MAY BE PRESENT.   A presumptive positive result was obtained on the submitted specimen  and confirmed on repeat testing.  While 2019 novel coronavirus  (SARS-CoV-2) nucleic acids may be present in the submitted sample  additional confirmatory testing may be necessary for epidemiological  and / or clinical management purposes  to differentiate between  SARS-CoV-2 and other Sarbecovirus currently known to infect humans.  If clinically indicated additional testing with an alternate test  methodology [phone]) is advised. The SARS-CoV-2 RNA is generally  detectable in upper and lower respiratory sp ecimens during the acute  phase of infection. The expected result is Negative. Fact Sheet for Patients:  BoilerBrush.com.cy Fact Sheet for Healthcare Providers: https://pope.com/ This test is not yet approved or cleared by the [place] FDA and has been authorized for detection and/or diagnosis of SARS-CoV-2 by FDA under an Emergency Use Authorization (EUA).  This EUA will remain in effect (meaning this test can be used) for the duration of the COVID-19 declaration under Section 564(b)(1) of the Act, 21 U.S.C. section 360bbb-3(b)(1), unless the authorization is terminated or revoked sooner. Performed at [facility], [address], [place], [place] [address]     Studies/Results: Portable Chest 1 View  Result Date: 01/26/2019 CLINICAL DATA:  63 year old male with respiratory failure. Negative for COVID-19. EXAM: PORTABLE CHEST 1 VIEW COMPARISON:  01/25/2019 and earlier. FINDINGS: Portable AP upright view at 0523 hours. Underlying large lung volumes. Stable cardiac size and mediastinal  contours. The right lung appears clear, with evidence of upper lung emphysema. Right PICC line has been placed and tip is at the level of the lower SVC. Patchy and nodular left upper lung opacity has mildly regressed since yesterday. Stable left lung ventilation elsewhere. No pneumothorax or pleural effusion. IMPRESSION: 1. Right PICC line placed, tip at the lower SVC level. 2. Evidence of emphysema. Left upper lung opacity mildly regressed since yesterday. No new cardiopulmonary abnormality. Electronically Signed   By: Odessa Fleming M.D.   On: 01/26/2019 06:04   Dg Chest Portable 1 View  Result Date: 01/25/2019 CLINICAL DATA:  Acute on chronic respiratory failure. EXAM: PORTABLE CHEST 1 VIEW COMPARISON:  Single-view of the chest 01/16/2019, 01/15/2019 and 10/27/2018. FINDINGS: The lungs are emphysematous. Increased airspace disease is seen in the left upper lobe. There is basilar fibrosis, worse on the left. Heart size is normal. No pneumothorax. IMPRESSION: Increased left upper lobe airspace opacity worrisome for pneumonia superimposed on scar. Emphysema and basilar fibrosis, worse on the left. Electronically Signed   By: Drusilla Kanner M.D.   On: 01/25/2019 07:48   [place] Ekg Site Rite  Result Date: 01/25/2019 If Site Rite image not attached, placement could not be confirmed due to current cardiac rhythm.   Medications:  Prior to Admission:  Medications Prior to Admission  Medication Sig Dispense Refill Last Dose  . albuterol (PROAIR HFA) 108 (90 BASE) MCG/ACT inhaler Inhale 2 puffs into the lungs every 6 (six) hours as needed for wheezing or shortness of breath. Shortness of Breath   unknown  . ALPRAZolam (XANAX) 1 MG tablet Take 0.5 tablets (0.5 mg total) by mouth 2 (two) times daily as needed for anxiety.  0   . fexofenadine-pseudoephedrine (ALLEGRA-D 24) 180-240 MG 24 hr tablet Take 1 tablet by mouth daily.   01/09/2019 at Unknown time  . fluticasone (FLONASE) 50 MCG/ACT nasal spray Place 2 sprays  into both nostrils daily.   01/08/2019 at Unknown time  . glimepiride (AMARYL) 2 MG tablet Take 2 mg by mouth daily with breakfast.   01/08/2019 at Unknown time  . Guaifenesin (MUCINEX MAXIMUM STRENGTH) 1200 MG TB12 Take 1 tablet (1,200 mg total) by mouth 2 (two) times daily. 20 each 0 01/09/2019 at Unknown time  . Insulin Glargine (LANTUS) 100 UNIT/ML Solostar Pen Inject 30 Units into the skin daily. 15 mL 2   . ipratropium-albuterol (DUONEB) 0.5-2.5 (3) MG/3ML SOLN Take 3 mLs by nebulization every 4 (four) hours as needed. For shortness of breath   01/09/2019 at Unknown time  . metFORMIN (GLUCOPHAGE-XR) 500 MG 24 hr tablet Take 500 mg by mouth 2 (two) times daily.    01/09/2019 at Unknown time  . montelukast (SINGULAIR) 10 MG tablet Take 10 mg by mouth every morning.    01/08/2019 at Unknown time  . Multiple Vitamins-Minerals (CENTRUM SILVER 50+MEN PO) Take 1 tablet by mouth daily.   01/09/2019 at Unknown time  . oxybutynin (DITROPAN) 5 MG tablet Take 1 tablet (5 mg total) by mouth 2 (two) times daily.     . pantoprazole (PROTONIX) 40 MG tablet Take 40 mg by mouth every morning.    01/08/2019 at Unknown time  . predniSONE (DELTASONE) 20 MG tablet Take 1-3 tablets (20-60 mg total) by mouth daily with breakfast. Take 60mg  daily for 2days then 40mg  daily for 2 days then 20mg  daily for 2days then STOP 5 tablet 0   . rosuvastatin (CRESTOR) 40 MG tablet Take 40 mg by mouth at bedtime.  01/08/2019 at Unknown time  . TRELEGY ELLIPTA 100-62.5-25 MCG/INH AEPB Take 2 puffs by mouth daily.   01/08/2019 at Unknown time   Scheduled: . Chlorhexidine Gluconate Cloth  6 each Topical Daily  . enoxaparin (LOVENOX) injection  40 mg Subcutaneous Q24H  . fluticasone  2 spray Each Nare Daily  . insulin aspart  0-20 Units Subcutaneous TID WC  . insulin aspart  10 Units Subcutaneous TID WC  . insulin glargine  40 Units Subcutaneous Daily  . ipratropium-albuterol  3 mL Nebulization Q4H  . methylPREDNISolone (SOLU-MEDROL) injection  40  mg Intravenous Q4H  . montelukast  10 mg Oral QHS  . oxybutynin  5 mg Oral BID  . pantoprazole  40 mg Oral q morning - 10a  . rosuvastatin  40 mg Oral QHS  . sodium chloride flush  10-40 mL Intracatheter Q12H   Continuous: . meropenem (MERREM) IV Stopped (01/26/19 0259)  . vancomycin Stopped (01/25/19 2315)   WUJ:[id number]:ALPRAZolam, ondansetron **OR** ondansetron (ZOFRAN) IV, sodium chloride flush, traZODone  Assesment: He has acute on chronic hypoxic and hypercapnic respiratory failure and just had a prolonged hospitalization requiring intubation and mechanical ventilation.  Although he has improved he is still high risk.  He has chest x-ray evidence of pneumonia.  Some of this was likely present on the previous admission.  Chest x-ray this morning which I have personally reviewed looks a little bit better  He has significant COPD at baseline he has exacerbation  He has diabetes which has been uncontrolled Principal Problem:   Acute and chronic respiratory failure (acute-on-chronic) (HCC) Active Problems:   Acute on chronic respiratory failure with hypercapnia (HCC)   COPD exacerbation (HCC)   HTN (hypertension)   DM (diabetes mellitus) (HCC)   Tobacco abuse   HLD (hyperlipidemia)   GERD (gastroesophageal reflux disease)   Anxiety   Chronic respiratory failure with hypoxia (HCC)   Uncontrolled type 2 diabetes mellitus with hyperglycemia (HCC)   COPD with acute exacerbation (HCC)   HCAP (healthcare-associated pneumonia)   On home O2   Elevated lactic acid level    Plan: Continue current treatments.  BiPAP as needed by clinical measures.  Continue antibiotics steroids inhaled bronchodilators.  Close monitoring for increasing respiratory failure    LOS: 1 day   Fredirick Maudlindward L Aubrielle Stroud 01/26/2019, 8:14 AM

## 2019-01-27 LAB — COMPREHENSIVE METABOLIC PANEL
ALT: 26 U/L (ref 0–44)
AST: 15 U/L (ref 15–41)
Albumin: 1.7 g/dL — ABNORMAL LOW (ref 3.5–5.0)
Alkaline Phosphatase: 121 U/L (ref 38–126)
Anion gap: 6 (ref 5–15)
BUN: 18 mg/dL (ref 8–23)
CO2: 29 mmol/L (ref 22–32)
Calcium: 9.2 mg/dL (ref 8.9–10.3)
Chloride: 98 mmol/L (ref 98–111)
Creatinine, Ser: 0.79 mg/dL (ref 0.61–1.24)
GFR calc Af Amer: >60 mL/min (ref >60)
GFR calc non Af Amer: >60 mL/min (ref >60)
Glucose, Bld: 243 mg/dL — ABNORMAL HIGH (ref 70–99)
Potassium: 4.4 mmol/L (ref 3.5–5.1)
Sodium: 133 mmol/L — ABNORMAL LOW (ref 135–145)
Total Bilirubin: 0.1 mg/dL — ABNORMAL LOW (ref 0.3–1.2)
Total Protein: 5.3 g/dL — ABNORMAL LOW (ref 6.5–8.1)

## 2019-01-27 LAB — CBC WITH DIFFERENTIAL/PLATELET
Abs Immature Granulocytes: 0.38 10*3/uL — ABNORMAL HIGH (ref 0.00–0.07)
Basophils Absolute: 0 10*3/uL (ref 0.0–0.1)
Basophils Relative: 0 %
Eosinophils Absolute: 0 10*3/uL (ref 0.0–0.5)
Eosinophils Relative: 0 %
HCT: 31 % — ABNORMAL LOW (ref 39.0–52.0)
Hemoglobin: 9.5 g/dL — ABNORMAL LOW (ref 13.0–17.0)
Immature Granulocytes: 1 %
Lymphocytes Relative: 2 %
Lymphs Abs: 0.5 10*3/uL — ABNORMAL LOW (ref 0.7–4.0)
MCH: 26.8 pg (ref 26.0–34.0)
MCHC: 30.6 g/dL (ref 30.0–36.0)
MCV: 87.6 fL (ref 80.0–100.0)
Monocytes Absolute: 0.4 10*3/uL (ref 0.1–1.0)
Monocytes Relative: 1 %
Neutro Abs: 29.4 10*3/uL — ABNORMAL HIGH (ref 1.7–7.7)
Neutrophils Relative %: 96 %
Platelets: 260 10*3/uL (ref 150–400)
RBC: 3.54 MIL/uL — ABNORMAL LOW (ref 4.22–5.81)
RDW: 14.4 % (ref 11.5–15.5)
WBC: 30.6 10*3/uL — ABNORMAL HIGH (ref 4.0–10.5)
nRBC: 0 % (ref 0.0–0.2)

## 2019-01-27 LAB — GLUCOSE, CAPILLARY
Glucose-Capillary: 226 mg/dL — ABNORMAL HIGH (ref 70–99)
Glucose-Capillary: 205 mg/dL — ABNORMAL HIGH (ref 70–99)
Glucose-Capillary: 138 mg/dL — ABNORMAL HIGH (ref 70–99)
Glucose-Capillary: 173 mg/dL — ABNORMAL HIGH (ref 70–99)

## 2019-01-27 LAB — MAGNESIUM: Magnesium: 2.1 mg/dL (ref 1.7–2.4)

## 2019-01-27 LAB — BRAIN NATRIURETIC PEPTIDE: B Natriuretic Peptide: 121 pg/mL — ABNORMAL HIGH (ref 0.0–100.0)

## 2019-01-27 LAB — PROCALCITONIN: Procalcitonin: 0.22 ng/mL

## 2019-01-27 MED ORDER — GUAIFENESIN ER 600 MG PO TB12
600.0000 mg | ORAL_TABLET | Freq: Two times a day (BID) | ORAL | Status: DC
  Administered 2019-01-27 – 2019-02-01 (×11): 600 mg via ORAL
  Filled 2019-01-27 (×11): qty 1

## 2019-01-27 MED ORDER — LORATADINE 10 MG PO TABS
10.0000 mg | ORAL_TABLET | Freq: Every day | ORAL | Status: DC
  Administered 2019-01-27 – 2019-02-01 (×6): 10 mg via ORAL
  Filled 2019-01-27 (×6): qty 1

## 2019-01-27 MED ORDER — ALTEPLASE 2 MG IJ SOLR
2.0000 mg | Freq: Once | INTRAMUSCULAR | Status: AC
  Administered 2019-01-27: 2 mg
  Filled 2019-01-27: qty 2

## 2019-01-27 MED ORDER — BUDESONIDE 0.25 MG/2ML IN SUSP
0.2500 mg | Freq: Two times a day (BID) | RESPIRATORY_TRACT | Status: DC
  Administered 2019-01-27 – 2019-01-29 (×5): 0.25 mg via RESPIRATORY_TRACT
  Filled 2019-01-27 (×5): qty 2

## 2019-01-27 NOTE — Progress Notes (Signed)
Patient did well On BiPAP took off about 0430 will place on [facility] BiPAP tonight for HS.

## 2019-01-27 NOTE — Progress Notes (Signed)
DL PICC R upper arm flushed with one port noted occluded.  Dr Manus Rudd notified with order obtained for cathflo.  Will adminster and reassess.

## 2019-01-27 NOTE — Progress Notes (Signed)
Subjective: He says he feels better.  He used BiPAP last night.  He tells me that he had a BiPAP machine but lost it sometime when he moved.  He did not have a sleep study by history so it sounds like he was on BiPAP.  He uses oxygen at home.  He is now on 6 L.  He says he feels better but he is still not at baseline  Objective: Vital signs in last 24 hours: Temp:  [97.4 F (36.3 C)-98.6 F (37 C)] 97.5 F (36.4 C) (04/25 0754) Pulse Rate:  [91-112] 100 (04/25 0800) Resp:  [15-35] 19 (04/25 0800) BP: (99-143)/(47-74) 105/69 (04/25 0800) SpO2:  [91 %-99 %] 97 % (04/25 0800) FiO2 (%):  [40 %] 40 % (04/25 0405) Weight:  [75.8 kg] 75.8 kg (04/25 0405) Weight change: 4 kg Last BM Date: 01/26/19  Intake/Output from previous day: 04/24 0701 - 04/25 0700 In: 1120 [P.O.:1020; IV Piggyback:100] Out: 1001 [Urine:1000; Stool:1]  PHYSICAL EXAM General appearance: alert, cooperative and mild distress Resp: rhonchi bilaterally Cardio: regular rate and rhythm, S1, S2 normal, no murmur, click, rub or gallop GI: soft, non-tender; bowel sounds normal; no masses,  no organomegaly Extremities: extremities normal, atraumatic, no cyanosis or edema  Lab Results:  Results for orders placed or performed during the hospital encounter of 01/25/19 (from the past 48 hour(s))  Blood gas, arterial     Status: Abnormal   Collection Time: 01/25/19  9:50 AM  Result Value Ref Range   FIO2 44.00    pH, Arterial 7.452 (H) 7.350 - 7.450   pCO2 arterial 48.9 (H) 32.0 - 48.0 mmHg   pO2, Arterial 59.7 (L) 83.0 - 108.0 mmHg   Bicarbonate 32.3 (H) 20.0 - 28.0 mmol/L   Acid-Base Excess 9.3 (H) 0.0 - 2.0 mmol/L   O2 Saturation 89.6 %   Patient temperature 37.8    Sample type BRACHIAL ARTERY    Allens test (pass/fail) BRACHIAL ARTERY (A) PASS    Comment: Performed at [facility], [address] [place], [place] [address]  Lactic acid, plasma     Status: Abnormal   Collection Time: 01/25/19 10:17 AM  Result Value  Ref Range   Lactic Acid, Venous 2.3 (HH) 0.5 - 1.9 mmol/L    Comment: CRITICAL RESULT CALLED TO, READ BACK BY AND VERIFIED WITH: SHORE,L@[email or web address]  BY MATTHEWS, B 4.23.2020 Performed at [facility], [address] [place], [place] [address]   SARS Coronavirus 2 [facility] order, Performed in [facility] hospital lab)     Status: None   Collection Time: 01/25/19 10:51 AM  Result Value Ref Range   SARS Coronavirus 2 NEGATIVE NEGATIVE    Comment: (NOTE) If result is NEGATIVE SARS-CoV-2 target nucleic acids are NOT DETECTED. The SARS-CoV-2 RNA is generally detectable in upper and lower  respiratory specimens during the acute phase of infection. The lowest  concentration of SARS-CoV-2 viral copies this assay can detect is 250  copies / mL. A negative result does not preclude SARS-CoV-2 infection  and should not be used as the sole basis for treatment or other  patient management decisions.  A negative result may occur with  improper specimen collection / handling, submission of specimen other  than nasopharyngeal swab, presence of viral mutation(s) within the  areas targeted by this assay, and inadequate number of viral copies  (<250 copies / mL). A negative result must be combined with clinical  observations, patient history, and epidemiological information. If result is POSITIVE SARS-CoV-2 target nucleic  acids are DETECTED. The SARS-CoV-2 RNA is generally detectable in upper and lower  respiratory specimens dur ing the acute phase of infection.  Positive  results are indicative of active infection with SARS-CoV-2.  Clinical  correlation with patient history and other diagnostic information is  necessary to determine patient infection status.  Positive results do  not rule out bacterial infection or co-infection with other viruses. If result is PRESUMPTIVE POSTIVE SARS-CoV-2 nucleic acids MAY BE PRESENT.   A presumptive positive result was obtained on the submitted specimen  and confirmed  on repeat testing.  While 2019 novel coronavirus  (SARS-CoV-2) nucleic acids may be present in the submitted sample  additional confirmatory testing may be necessary for epidemiological  and / or clinical management purposes  to differentiate between  SARS-CoV-2 and other Sarbecovirus currently known to infect humans.  If clinically indicated additional testing with an alternate test  methodology [phone]) is advised. The SARS-CoV-2 RNA is generally  detectable in upper and lower respiratory sp ecimens during the acute  phase of infection. The expected result is Negative. Fact Sheet for Patients:  BoilerBrush.com.cy Fact Sheet for Healthcare Providers: https://pope.com/ This test is not yet approved or cleared by the [place] FDA and has been authorized for detection and/or diagnosis of SARS-CoV-2 by FDA under an Emergency Use Authorization (EUA).  This EUA will remain in effect (meaning this test can be used) for the duration of the COVID-19 declaration under Section 564(b)(1) of the Act, 21 U.S.C. section 360bbb-3(b)(1), unless the authorization is terminated or revoked sooner. Performed at [facility], [address] [place], [place] [address]   Respiratory Panel by PCR     Status: None   Collection Time: 01/25/19 12:32 PM  Result Value Ref Range   Adenovirus NOT DETECTED NOT DETECTED   Coronavirus 229E NOT DETECTED NOT DETECTED    Comment: (NOTE) The Coronavirus on the Respiratory Panel, DOES NOT test for the novel  Coronavirus (2019 nCoV)    Coronavirus HKU1 NOT DETECTED NOT DETECTED   Coronavirus NL63 NOT DETECTED NOT DETECTED   Coronavirus OC43 NOT DETECTED NOT DETECTED   Metapneumovirus NOT DETECTED NOT DETECTED   Rhinovirus / Enterovirus NOT DETECTED NOT DETECTED   Influenza A NOT DETECTED NOT DETECTED   Influenza B NOT DETECTED NOT DETECTED   Parainfluenza Virus 1 NOT DETECTED NOT DETECTED   Parainfluenza Virus 2  NOT DETECTED NOT DETECTED   Parainfluenza Virus 3 NOT DETECTED NOT DETECTED   Parainfluenza Virus 4 NOT DETECTED NOT DETECTED   Respiratory Syncytial Virus NOT DETECTED NOT DETECTED   Bordetella pertussis NOT DETECTED NOT DETECTED   Chlamydophila pneumoniae NOT DETECTED NOT DETECTED   Mycoplasma pneumoniae NOT DETECTED NOT DETECTED    Comment: Performed at [facility], [address] [place], [place] [address]  Procalcitonin - Baseline     Status: None   Collection Time: 01/25/19 12:46 PM  Result Value Ref Range   Procalcitonin <0.10 ng/mL    Comment:        Interpretation: PCT (Procalcitonin) <= 0.5 ng/mL: Systemic infection (sepsis) is not likely. Local bacterial infection is possible. (NOTE)       Sepsis PCT Algorithm           Lower Respiratory Tract                                      Infection PCT Algorithm    ----------------------------     ----------------------------  PCT < 0.25 ng/mL                PCT < 0.10 ng/mL         Strongly encourage             Strongly discourage   discontinuation of antibiotics    initiation of antibiotics    ----------------------------     -----------------------------       PCT 0.25 - 0.50 ng/mL            PCT 0.10 - 0.25 ng/mL               OR       >80% decrease in PCT            Discourage initiation of                                            antibiotics      Encourage discontinuation           of antibiotics    ----------------------------     -----------------------------         PCT >= 0.50 ng/mL              PCT 0.26 - 0.50 ng/mL               AND        <80% decrease in PCT             Encourage initiation of                                             antibiotics       Encourage continuation           of antibiotics    ----------------------------     -----------------------------        PCT >= 0.50 ng/mL                  PCT > 0.50 ng/mL               AND         increase in PCT                  Strongly  encourage                                      initiation of antibiotics    Strongly encourage escalation           of antibiotics                                     -----------------------------                                           PCT <= 0.25 ng/mL  OR                                        > 80% decrease in PCT                                     Discontinue / Do not initiate                                             antibiotics Performed at [facility], [address] [place], [place] [id number]   CBG monitoring, ED     Status: Abnormal   Collection Time: 01/25/19 12:58 PM  Result Value Ref Range   Glucose-Capillary 173 (H) 70 - 99 mg/dL  Glucose, capillary     Status: Abnormal   Collection Time: 01/25/19  5:00 PM  Result Value Ref Range   Glucose-Capillary 177 (H) 70 - 99 mg/dL  Glucose, capillary     Status: Abnormal   Collection Time: 01/25/19  7:43 PM  Result Value Ref Range   Glucose-Capillary 196 (H) 70 - 99 mg/dL  Glucose, capillary     Status: Abnormal   Collection Time: 01/25/19 11:52 PM  Result Value Ref Range   Glucose-Capillary 240 (H) 70 - 99 mg/dL  Glucose, capillary     Status: Abnormal   Collection Time: 01/26/19  4:47 AM  Result Value Ref Range   Glucose-Capillary 233 (H) 70 - 99 mg/dL  Procalcitonin     Status: None   Collection Time: 01/26/19  5:42 AM  Result Value Ref Range   Procalcitonin 0.30 ng/mL    Comment:        Interpretation: PCT (Procalcitonin) <= 0.5 ng/mL: Systemic infection (sepsis) is not likely. Local bacterial infection is possible. (NOTE)       Sepsis PCT Algorithm           Lower Respiratory Tract                                      Infection PCT Algorithm    ----------------------------     ----------------------------         PCT < 0.25 ng/mL                PCT < 0.10 ng/mL         Strongly encourage             Strongly discourage   discontinuation of antibiotics     initiation of antibiotics    ----------------------------     -----------------------------       PCT 0.25 - 0.50 ng/mL            PCT 0.10 - 0.25 ng/mL               OR       >80% decrease in PCT            Discourage initiation of  antibiotics      Encourage discontinuation           of antibiotics    ----------------------------     -----------------------------         PCT >= 0.50 ng/mL              PCT 0.26 - 0.50 ng/mL               AND        <80% decrease in PCT             Encourage initiation of                                             antibiotics       Encourage continuation           of antibiotics    ----------------------------     -----------------------------        PCT >= 0.50 ng/mL                  PCT > 0.50 ng/mL               AND         increase in PCT                  Strongly encourage                                      initiation of antibiotics    Strongly encourage escalation           of antibiotics                                     -----------------------------                                           PCT <= 0.25 ng/mL                                                 OR                                        > 80% decrease in PCT                                     Discontinue / Do not initiate                                             antibiotics Performed at [facility], [address] [place], [place] [address]   Comprehensive metabolic panel     Status: Abnormal   Collection Time: 01/26/19  5:42  AM  Result Value Ref Range   Sodium 133 (L) 135 - 145 mmol/L   Potassium 4.4 3.5 - 5.1 mmol/L   Chloride 98 98 - 111 mmol/L   CO2 26 22 - 32 mmol/L   Glucose, Bld 252 (H) 70 - 99 mg/dL   BUN 16 8 - 23 mg/dL   Creatinine, Ser 1.61 0.61 - 1.24 mg/dL   Calcium 8.9 8.9 - 09.6 mg/dL   Total Protein 5.7 (L) 6.5 - 8.1 g/dL   Albumin 1.9 (L) 3.5 - 5.0 g/dL   AST 12 (L) 15 - 41 U/L   ALT 30 0 - 44 U/L    Alkaline Phosphatase 133 (H) 38 - 126 U/L   Total Bilirubin 0.5 0.3 - 1.2 mg/dL   GFR calc non Af Amer >60 >60 mL/min   GFR calc Af Amer >60 >60 mL/min   Anion gap 9 5 - 15    Comment: Performed at [facility], [address], [place], [place] [address]  Magnesium     Status: None   Collection Time: 01/26/19  5:42 AM  Result Value Ref Range   Magnesium 2.1 1.7 - 2.4 mg/dL    Comment: Performed at [facility], [address], [place], [place] [address]  CBC WITH DIFFERENTIAL     Status: Abnormal   Collection Time: 01/26/19  5:42 AM  Result Value Ref Range   WBC 32.2 (H) 4.0 - 10.5 K/uL   RBC 3.99 (L) 4.22 - 5.81 MIL/uL   Hemoglobin 10.6 (L) 13.0 - 17.0 g/dL    Comment: REPEATED TO VERIFY   HCT 35.3 (L) 39.0 - 52.0 %   MCV 88.5 80.0 - 100.0 fL   MCH 26.6 26.0 - 34.0 pg   MCHC 30.0 30.0 - 36.0 g/dL   RDW 14.7 82.9 - 56.2 %   Platelets 233 150 - 400 K/uL   nRBC 0.0 0.0 - 0.2 %   Neutrophils Relative % 97 %   Neutro Abs 31.1 (H) 1.7 - 7.7 K/uL   Lymphocytes Relative 1 %   Lymphs Abs 0.4 (L) 0.7 - 4.0 K/uL   Monocytes Relative 1 %   Monocytes Absolute 0.3 0.1 - 1.0 K/uL   Eosinophils Relative 0 %   Eosinophils Absolute 0.0 0.0 - 0.5 K/uL   Basophils Relative 0 %   Basophils Absolute 0.1 0.0 - 0.1 K/uL   WBC Morphology TOXIC GRANULATION     Comment: VACUOLATED NEUTROPHILS   Immature Granulocytes 1 %   Abs Immature Granulocytes 0.27 (H) 0.00 - 0.07 K/uL    Comment: Performed at [facility], [address], [place], [place] [address]  Brain natriuretic peptide     Status: Abnormal   Collection Time: 01/26/19  5:42 AM  Result Value Ref Range   B Natriuretic Peptide 187.0 (H) 0.0 - 100.0 pg/mL    Comment: Performed at [facility], [address] [place], [place] [address]  Glucose, capillary     Status: Abnormal   Collection Time: 01/26/19  7:45 AM  Result Value Ref Range   Glucose-Capillary 249 (H) 70 - 99 mg/dL  Glucose, capillary     Status: Abnormal   Collection Time:  01/26/19 11:06 AM  Result Value Ref Range   Glucose-Capillary 395 (H) 70 - 99 mg/dL  Glucose, capillary     Status: Abnormal   Collection Time: 01/26/19  4:04 PM  Result Value Ref Range   Glucose-Capillary 148 (H) 70 - 99 mg/dL  Glucose, capillary  Status: Abnormal   Collection Time: 01/26/19  9:18 PM  Result Value Ref Range   Glucose-Capillary 100 (H) 70 - 99 mg/dL   Comment 1 Notify RN    Comment 2 Document in Chart   Procalcitonin     Status: None   Collection Time: 01/27/19  4:12 AM  Result Value Ref Range   Procalcitonin 0.22 ng/mL    Comment:        Interpretation: PCT (Procalcitonin) <= 0.5 ng/mL: Systemic infection (sepsis) is not likely. Local bacterial infection is possible. (NOTE)       Sepsis PCT Algorithm           Lower Respiratory Tract                                      Infection PCT Algorithm    ----------------------------     ----------------------------         PCT < 0.25 ng/mL                PCT < 0.10 ng/mL         Strongly encourage             Strongly discourage   discontinuation of antibiotics    initiation of antibiotics    ----------------------------     -----------------------------       PCT 0.25 - 0.50 ng/mL            PCT 0.10 - 0.25 ng/mL               OR       >80% decrease in PCT            Discourage initiation of                                            antibiotics      Encourage discontinuation           of antibiotics    ----------------------------     -----------------------------         PCT >= 0.50 ng/mL              PCT 0.26 - 0.50 ng/mL               AND        <80% decrease in PCT             Encourage initiation of                                             antibiotics       Encourage continuation           of antibiotics    ----------------------------     -----------------------------        PCT >= 0.50 ng/mL                  PCT > 0.50 ng/mL               AND         increase in PCT                  Strongly  encourage                                      initiation of antibiotics    Strongly encourage escalation           of antibiotics                                     -----------------------------                                           PCT <= 0.25 ng/mL                                                 OR                                        > 80% decrease in PCT                                     Discontinue / Do not initiate                                             antibiotics Performed at [facility], [address] [place], [place] [address]   Comprehensive metabolic panel     Status: Abnormal   Collection Time: 01/27/19  4:12 AM  Result Value Ref Range   Sodium 133 (L) 135 - 145 mmol/L   Potassium 4.4 3.5 - 5.1 mmol/L   Chloride 98 98 - 111 mmol/L   CO2 29 22 - 32 mmol/L   Glucose, Bld 243 (H) 70 - 99 mg/dL   BUN 18 8 - 23 mg/dL   Creatinine, Ser 1.91 0.61 - 1.24 mg/dL   Calcium 9.2 8.9 - 47.8 mg/dL   Total Protein 5.3 (L) 6.5 - 8.1 g/dL   Albumin 1.7 (L) 3.5 - 5.0 g/dL   AST 15 15 - 41 U/L   ALT 26 0 - 44 U/L   Alkaline Phosphatase 121 38 - 126 U/L   Total Bilirubin 0.1 (L) 0.3 - 1.2 mg/dL   GFR calc non Af Amer >60 >60 mL/min   GFR calc Af Amer >60 >60 mL/min   Anion gap 6 5 - 15    Comment: Performed at [facility], [address], [place], [place] [address]  Magnesium     Status: None   Collection Time: 01/27/19  4:12 AM  Result Value Ref Range   Magnesium 2.1 1.7 - 2.4 mg/dL    Comment: Performed at [facility], [address], [place], [place] [address]  CBC WITH DIFFERENTIAL     Status: Abnormal   Collection Time: 01/27/19  4:12 AM  Result Value Ref Range   WBC 30.6 (H) 4.0 - 10.5 K/uL   RBC 3.54 (  L) 4.22 - 5.81 MIL/uL   Hemoglobin 9.5 (L) 13.0 - 17.0 g/dL   HCT 16.1 (L) 09.6 - 04.5 %   MCV 87.6 80.0 - 100.0 fL   MCH 26.8 26.0 - 34.0 pg   MCHC 30.6 30.0 - 36.0 g/dL   RDW 40.9 81.1 - 91.4 %   Platelets 260 150 - 400 K/uL   nRBC 0.0 0.0 - 0.2 %    Neutrophils Relative % 96 %   Neutro Abs 29.4 (H) 1.7 - 7.7 K/uL   Lymphocytes Relative 2 %   Lymphs Abs 0.5 (L) 0.7 - 4.0 K/uL   Monocytes Relative 1 %   Monocytes Absolute 0.4 0.1 - 1.0 K/uL   Eosinophils Relative 0 %   Eosinophils Absolute 0.0 0.0 - 0.5 K/uL   Basophils Relative 0 %   Basophils Absolute 0.0 0.0 - 0.1 K/uL   WBC Morphology TOXIC GRANULATION     Comment: VACUOLATED NEUTROPHILS   Immature Granulocytes 1 %   Abs Immature Granulocytes 0.38 (H) 0.00 - 0.07 K/uL    Comment: Performed at [facility], [address], [place], [place] [address]  Brain natriuretic peptide     Status: Abnormal   Collection Time: 01/27/19  4:12 AM  Result Value Ref Range   B Natriuretic Peptide 121.0 (H) 0.0 - 100.0 pg/mL    Comment: Performed at [facility], [address] [place], [place] [address]  Glucose, capillary     Status: Abnormal   Collection Time: 01/27/19  7:53 AM  Result Value Ref Range   Glucose-Capillary 226 (H) 70 - 99 mg/dL    ABGS Recent Labs    01/25/19 0950  PHART 7.452*  PO2ART 59.7*  HCO3 32.3*   CULTURES Recent Results (from the past 240 hour(s))  Blood Culture (routine x 2)     Status: None (Preliminary result)   Collection Time: 01/25/19  7:58 AM  Result Value Ref Range Status   Specimen Description RIGHT ANTECUBITAL  Final   Special Requests   Final    BOTTLES DRAWN AEROBIC AND ANAEROBIC Blood Culture adequate volume   Culture   Final    NO GROWTH 2 DAYS Performed at [facility], [address] [place], [place] [address]    Report Status PENDING  Incomplete  Blood Culture (routine x 2)     Status: None (Preliminary result)   Collection Time: 01/25/19  9:01 AM  Result Value Ref Range Status   Specimen Description RIGHT ANTECUBITAL  Final   Special Requests   Final    BOTTLES DRAWN AEROBIC AND ANAEROBIC Blood Culture adequate volume   Culture   Final    NO GROWTH 2 DAYS Performed at [facility], [address] [place], [place] [address]     Report Status PENDING  Incomplete  SARS Coronavirus 2 [facility] order, Performed in [facility] hospital lab)     Status: None   Collection Time: 01/25/19 10:51 AM  Result Value Ref Range Status   SARS Coronavirus 2 NEGATIVE NEGATIVE Final    Comment: (NOTE) If result is NEGATIVE SARS-CoV-2 target nucleic acids are NOT DETECTED. The SARS-CoV-2 RNA is generally detectable in upper and lower  respiratory specimens during the acute phase of infection. The lowest  concentration of SARS-CoV-2 viral copies this assay can detect is 250  copies / mL. A negative result does not preclude SARS-CoV-2 infection  and should not be used as the sole basis for treatment or other  patient management decisions.  A  negative result may occur with  improper specimen collection / handling, submission of specimen other  than nasopharyngeal swab, presence of viral mutation(s) within the  areas targeted by this assay, and inadequate number of viral copies  (<250 copies / mL). A negative result must be combined with clinical  observations, patient history, and epidemiological information. If result is POSITIVE SARS-CoV-2 target nucleic acids are DETECTED. The SARS-CoV-2 RNA is generally detectable in upper and lower  respiratory specimens dur ing the acute phase of infection.  Positive  results are indicative of active infection with SARS-CoV-2.  Clinical  correlation with patient history and other diagnostic information is  necessary to determine patient infection status.  Positive results do  not rule out bacterial infection or co-infection with other viruses. If result is PRESUMPTIVE POSTIVE SARS-CoV-2 nucleic acids MAY BE PRESENT.   A presumptive positive result was obtained on the submitted specimen  and confirmed on repeat testing.  While 2019 novel coronavirus  (SARS-CoV-2) nucleic acids may be present in the submitted sample  additional confirmatory testing may be necessary for epidemiological  and /  or clinical management purposes  to differentiate between  SARS-CoV-2 and other Sarbecovirus currently known to infect humans.  If clinically indicated additional testing with an alternate test  methodology [phone]) is advised. The SARS-CoV-2 RNA is generally  detectable in upper and lower respiratory sp ecimens during the acute  phase of infection. The expected result is Negative. Fact Sheet for Patients:  BoilerBrush.com.cy Fact Sheet for Healthcare Providers: https://pope.com/ This test is not yet approved or cleared by the [place] FDA and has been authorized for detection and/or diagnosis of SARS-CoV-2 by FDA under an Emergency Use Authorization (EUA).  This EUA will remain in effect (meaning this test can be used) for the duration of the COVID-19 declaration under Section 564(b)(1) of the Act, 21 U.S.C. section 360bbb-3(b)(1), unless the authorization is terminated or revoked sooner. Performed at [facility], [address], [place], [place] [address]   Respiratory Panel by PCR     Status: None   Collection Time: 01/25/19 12:32 PM  Result Value Ref Range Status   Adenovirus NOT DETECTED NOT DETECTED Final   Coronavirus 229E NOT DETECTED NOT DETECTED Final    Comment: (NOTE) The Coronavirus on the Respiratory Panel, DOES NOT test for the novel  Coronavirus (2019 nCoV)    Coronavirus HKU1 NOT DETECTED NOT DETECTED Final   Coronavirus NL63 NOT DETECTED NOT DETECTED Final   Coronavirus OC43 NOT DETECTED NOT DETECTED Final   Metapneumovirus NOT DETECTED NOT DETECTED Final   Rhinovirus / Enterovirus NOT DETECTED NOT DETECTED Final   Influenza A NOT DETECTED NOT DETECTED Final   Influenza B NOT DETECTED NOT DETECTED Final   Parainfluenza Virus 1 NOT DETECTED NOT DETECTED Final   Parainfluenza Virus 2 NOT DETECTED NOT DETECTED Final   Parainfluenza Virus 3 NOT DETECTED NOT DETECTED Final   Parainfluenza Virus 4 NOT DETECTED NOT  DETECTED Final   Respiratory Syncytial Virus NOT DETECTED NOT DETECTED Final   Bordetella pertussis NOT DETECTED NOT DETECTED Final   Chlamydophila pneumoniae NOT DETECTED NOT DETECTED Final   Mycoplasma pneumoniae NOT DETECTED NOT DETECTED Final    Comment: Performed at [facility], [address], [place], [place] [address]   Studies/Results: Portable Chest 1 View  Result Date: 01/26/2019 CLINICAL DATA:  63 year old male with respiratory failure. Negative for COVID-19. EXAM: PORTABLE CHEST 1 VIEW COMPARISON:  01/25/2019 and earlier. FINDINGS: Portable AP upright view  at 0523 hours. Underlying large lung volumes. Stable cardiac size and mediastinal contours. The right lung appears clear, with evidence of upper lung emphysema. Right PICC line has been placed and tip is at the level of the lower SVC. Patchy and nodular left upper lung opacity has mildly regressed since yesterday. Stable left lung ventilation elsewhere. No pneumothorax or pleural effusion. IMPRESSION: 1. Right PICC line placed, tip at the lower SVC level. 2. Evidence of emphysema. Left upper lung opacity mildly regressed since yesterday. No new cardiopulmonary abnormality. Electronically Signed   By: Odessa Fleming M.D.   On: 01/26/2019 06:04   [place] Ekg Site Rite  Result Date: 01/25/2019 If Site Rite image not attached, placement could not be confirmed due to current cardiac rhythm.   Medications:  Prior to Admission:  Medications Prior to Admission  Medication Sig Dispense Refill Last Dose  . albuterol (PROAIR HFA) 108 (90 BASE) MCG/ACT inhaler Inhale 2 puffs into the lungs every 6 (six) hours as needed for wheezing or shortness of breath. Shortness of Breath   unknown  . ALPRAZolam (XANAX) 1 MG tablet Take 0.5 tablets (0.5 mg total) by mouth 2 (two) times daily as needed for anxiety.  0   . fexofenadine-pseudoephedrine (ALLEGRA-D 24) 180-240 MG 24 hr tablet Take 1 tablet by mouth daily.   01/09/2019 at Unknown time  . fluticasone  (FLONASE) 50 MCG/ACT nasal spray Place 2 sprays into both nostrils daily.   01/08/2019 at Unknown time  . glimepiride (AMARYL) 2 MG tablet Take 2 mg by mouth daily with breakfast.   01/08/2019 at Unknown time  . Guaifenesin (MUCINEX MAXIMUM STRENGTH) 1200 MG TB12 Take 1 tablet (1,200 mg total) by mouth 2 (two) times daily. 20 each 0 01/09/2019 at Unknown time  . Insulin Glargine (LANTUS) 100 UNIT/ML Solostar Pen Inject 30 Units into the skin daily. 15 mL 2   . ipratropium-albuterol (DUONEB) 0.5-2.5 (3) MG/3ML SOLN Take 3 mLs by nebulization every 4 (four) hours as needed. For shortness of breath   01/09/2019 at Unknown time  . metFORMIN (GLUCOPHAGE-XR) 500 MG 24 hr tablet Take 500 mg by mouth 2 (two) times daily.    01/09/2019 at Unknown time  . montelukast (SINGULAIR) 10 MG tablet Take 10 mg by mouth every morning.    01/08/2019 at Unknown time  . Multiple Vitamins-Minerals (CENTRUM SILVER 50+MEN PO) Take 1 tablet by mouth daily.   01/09/2019 at Unknown time  . oxybutynin (DITROPAN) 5 MG tablet Take 1 tablet (5 mg total) by mouth 2 (two) times daily.     . pantoprazole (PROTONIX) 40 MG tablet Take 40 mg by mouth every morning.    01/08/2019 at Unknown time  . predniSONE (DELTASONE) 20 MG tablet Take 1-3 tablets (20-60 mg total) by mouth daily with breakfast. Take 60mg  daily for 2days then 40mg  daily for 2 days then 20mg  daily for 2days then STOP 5 tablet 0   . rosuvastatin (CRESTOR) 40 MG tablet Take 40 mg by mouth at bedtime.    01/08/2019 at Unknown time  . TRELEGY ELLIPTA 100-62.5-25 MCG/INH AEPB Take 2 puffs by mouth daily.   01/08/2019 at Unknown time   Scheduled: . Chlorhexidine Gluconate Cloth  6 each Topical Daily  . doxycycline  100 mg Oral Q12H  . enoxaparin (LOVENOX) injection  40 mg Subcutaneous Q24H  . fluticasone  2 spray Each Nare Daily  . insulin aspart  0-20 Units Subcutaneous TID WC  . insulin aspart  10 Units Subcutaneous TID WC  .  insulin glargine  40 Units Subcutaneous Daily  .  ipratropium-albuterol  3 mL Nebulization Q4H  . methylPREDNISolone (SOLU-MEDROL) injection  40 mg Intravenous Q4H  . montelukast  10 mg Oral QHS  . oxybutynin  5 mg Oral BID  . pantoprazole  40 mg Oral q morning - 10a  . rosuvastatin  40 mg Oral QHS  . sodium chloride flush  10-40 mL Intracatheter Q12H   Continuous:  ZOX:[id number], ondansetron **OR** ondansetron (ZOFRAN) IV, sodium chloride flush, traZODone  Assesment: He was admitted with acute on chronic hypoxic and hypercapnic respiratory failure.  He had been in the hospital with pneumonia and required intubation and mechanical ventilation on that episode.  He still has what appears to be pneumonia on chest x-ray and is being treated for that.  He is known to have severe COPD at baseline and apparently had BiPAP in the past so that will need further investigation  He has diabetes and is on sliding scale. Principal Problem:   Acute and chronic respiratory failure (acute-on-chronic) (HCC) Active Problems:   Acute on chronic respiratory failure with hypercapnia (HCC)   COPD exacerbation (HCC)   HTN (hypertension)   DM (diabetes mellitus) (HCC)   Tobacco abuse   HLD (hyperlipidemia)   GERD (gastroesophageal reflux disease)   Anxiety   Chronic respiratory failure with hypoxia (HCC)   Uncontrolled type 2 diabetes mellitus with hyperglycemia (HCC)   COPD with acute exacerbation (HCC)   HCAP (healthcare-associated pneumonia)   On home O2   Elevated lactic acid level    Plan: To new antibiotics.  Continue BiPAP at night.  He will need case management involvement to see if we can get him BiPAP again at home.  He says he got this about 10 years ago so I will do some more investigation.    LOS: 2 days   Fredirick Maudlin 01/27/2019, 9:18 AM

## 2019-01-27 NOTE — Progress Notes (Signed)
PROGRESS NOTE    Jeff Ray  ZOX:[id number]  DOB: [birth year]  DOA: 01/25/2019 PCP: Gareth Morgan, MD   Brief Admission Hx: 63 year old smoker with home oxygen dependent COPD recently discharged from [facility] for pneumonia and COPD exacerbation had been doing fairly well at home but suddenly developed acute shortness of breath and hypoxia.  He was admitted with acute on chronic respiratory failure.  01/27/2019: Patient continues to improve.  Patient is on 4 L of supplemental oxygen.  Usually, patient is on 2 L of supplemental oxygen.  Will continue to optimize patient's care.  MDM/Assessment & Plan:   Acute on chronic respiratory failure-suspect secondary to COPD exacerbation.  He seems to be responding very well to treatment.  He is off BiPAP now but it is standing by as he does have high risk for decompensation.  He remains on high flow nasal cannula.  He reports that he is breathing much better.  Plan to de-escalate antibiotics today. 01/27/2019: Continue to treat underlying COPD with exacerbation.  Patient is slowly improving.  COPD with acute exacerbation- he is on IV steroids antibiotics and neb treatments.  He reports that he is breathing better today.  He has been weaned off BiPAP and now on high flow nasal cannula and remains in the stepdown unit. 01/27/2019: Slowly improving.  Optimize COPD treatment.  Insulin requiring diabetes mellitus with hyperglycemia-he is having some hyperglycemia associated with steroids however he never received his Lantus dose yesterday.  I have reordered for him to receive Lantus insulin this morning.  We have ordered prandial NovoLog coverage and supplemental sliding scale coverage.  Continue CBG testing 5 times per day. 09/28/2019: Continue to optimize.  Sepsis-has resolved with treatments (as per prior documentation).  Essential hypertension: Continue to optimize.  Tobacco abuse:  DVT prophylaxis: Lovenox Code Status: Full Family  Communication:  Disposition Plan: Moved to stepdown unit  Consultants:  Pulmonology Dr. Juanetta Gosling  Procedures:    Antimicrobials:  Meropenem 4/23-4/24  Vancomycin 4/23-4/24  Doxycycline 4/24 -   Subjective: No new complaints. Patient is slowly improving.  Objective: Vitals:   01/27/19 0739 01/27/19 0754 01/27/19 0800 01/27/19 0900  BP:   105/69 118/67  Pulse:  97 100 (!) 105  Resp:  (!) 23 19 (!) 23  Temp:  (!) 97.5 F (36.4 C)    TempSrc:  Oral    SpO2: 95% 97% 97% 94%  Weight:      Height:        Intake/Output Summary (Last 24 hours) at 01/27/2019 1102 Last data filed at 01/27/2019 0425 Gross per 24 hour  Intake 880 ml  Output 1001 ml  Net -121 ml   Filed Weights   01/25/19 1700 01/26/19 0500 01/27/19 0405  Weight: 73.4 kg 74.1 kg 75.8 kg     REVIEW OF SYSTEMS  As per history otherwise all reviewed and reported negative  Exam:  General exam: Not in any distress.  Awake and alert. Respiratory system: Decreased air entry.  Inspiratory and expiratory wheeze. Cardiovascular system: S1 & S2 heard.  Gastrointestinal system: Abdomen is nondistended, soft and nontender. Normal bowel sounds heard. Central nervous system: Alert and oriented. No focal neurological deficits. Extremities: no CCE.  Data Reviewed: Basic Metabolic Panel: Recent Labs  Lab 01/25/19 0757 01/26/19 0542 01/27/19 0412  NA 133* 133* 133*  K 3.9 4.4 4.4  CL 89* 98 98  CO2 GLUCOSE 161* 252* 243*  BUN CREATININE 0.94 0.73 0.79  CALCIUM 9.9 8.9 9.2  MG  --  2.1 2.1   Liver Function Tests: Recent Labs  Lab 01/25/19 0757 01/26/19 0542 01/27/19 0412  AST 18 12* 15  ALT 42 30 26  ALKPHOS 152* 133* 121  BILITOT 0.6 0.5 0.1*  PROT 7.7 5.7* 5.3*  ALBUMIN 2.8* 1.9* 1.7*   No results for input(s): LIPASE, AMYLASE in the last 168 hours. No results for input(s): AMMONIA in the last 168 hours. CBC: Recent Labs  Lab 01/25/19 0757 01/26/19 0542 01/27/19  0412  WBC 30.7* 32.2* 30.6*  NEUTROABS 26.4* 31.1* 29.4*  HGB 14.4 10.6* 9.5*  HCT 46.9 35.3* 31.0*  MCV 87.0 88.5 87.6  PLT 304 233 260   Cardiac Enzymes: No results for input(s): CKTOTAL, CKMB, CKMBINDEX, TROPONINI in the last 168 hours. CBG (last 3)  Recent Labs    01/26/19 1604 01/26/19 2118 01/27/19 0753  GLUCAP 148* 100* 226*   Recent Results (from the past 240 hour(s))  Blood Culture (routine x 2)     Status: None (Preliminary result)   Collection Time: 01/25/19  7:58 AM  Result Value Ref Range Status   Specimen Description RIGHT ANTECUBITAL  Final   Special Requests   Final    BOTTLES DRAWN AEROBIC AND ANAEROBIC Blood Culture adequate volume   Culture   Final    NO GROWTH 2 DAYS Performed at [facility], [address] [place], [place] [address]    Report Status PENDING  Incomplete  Blood Culture (routine x 2)     Status: None (Preliminary result)   Collection Time: 01/25/19  9:01 AM  Result Value Ref Range Status   Specimen Description RIGHT ANTECUBITAL  Final   Special Requests   Final    BOTTLES DRAWN AEROBIC AND ANAEROBIC Blood Culture adequate volume   Culture   Final    NO GROWTH 2 DAYS Performed at [facility], [address], [place], [place] [address]    Report Status PENDING  Incomplete  SARS Coronavirus 2 [facility] order, Performed in [facility] hospital lab)     Status: None   Collection Time: 01/25/19 10:51 AM  Result Value Ref Range Status   SARS Coronavirus 2 NEGATIVE NEGATIVE Final    Comment: (NOTE) If result is NEGATIVE SARS-CoV-2 target nucleic acids are NOT DETECTED. The SARS-CoV-2 RNA is generally detectable in upper and lower  respiratory specimens during the acute phase of infection. The lowest  concentration of SARS-CoV-2 viral copies this assay can detect is 250  copies / mL. A negative result does not preclude SARS-CoV-2 infection  and should not be used as the sole basis for treatment or other  patient management decisions.  A  negative result may occur with  improper specimen collection / handling, submission of specimen other  than nasopharyngeal swab, presence of viral mutation(s) within the  areas targeted by this assay, and inadequate number of viral copies  (<250 copies / mL). A negative result must be combined with clinical  observations, patient history, and epidemiological information. If result is POSITIVE SARS-CoV-2 target nucleic acids are DETECTED. The SARS-CoV-2 RNA is generally detectable in upper and lower  respiratory specimens dur ing the acute phase of infection.  Positive  results are indicative of active infection with SARS-CoV-2.  Clinical  correlation with patient history and other diagnostic information is  necessary to determine patient infection status.  Positive results do  not rule out bacterial infection or co-infection with other viruses. If result is PRESUMPTIVE POSTIVE SARS-CoV-2  nucleic acids MAY BE PRESENT.   A presumptive positive result was obtained on the submitted specimen  and confirmed on repeat testing.  While 2019 novel coronavirus  (SARS-CoV-2) nucleic acids may be present in the submitted sample  additional confirmatory testing may be necessary for epidemiological  and / or clinical management purposes  to differentiate between  SARS-CoV-2 and other Sarbecovirus currently known to infect humans.  If clinically indicated additional testing with an alternate test  methodology [phone]) is advised. The SARS-CoV-2 RNA is generally  detectable in upper and lower respiratory sp ecimens during the acute  phase of infection. The expected result is Negative. Fact Sheet for Patients:  BoilerBrush.com.cy Fact Sheet for Healthcare Providers: https://pope.com/ This test is not yet approved or cleared by the [place] FDA and has been authorized for detection and/or diagnosis of SARS-CoV-2 by FDA under an Emergency Use  Authorization (EUA).  This EUA will remain in effect (meaning this test can be used) for the duration of the COVID-19 declaration under Section 564(b)(1) of the Act, 21 U.S.C. section 360bbb-3(b)(1), unless the authorization is terminated or revoked sooner. Performed at [facility], [address], [place], [place] [address]   Respiratory Panel by PCR     Status: None   Collection Time: 01/25/19 12:32 PM  Result Value Ref Range Status   Adenovirus NOT DETECTED NOT DETECTED Final   Coronavirus 229E NOT DETECTED NOT DETECTED Final    Comment: (NOTE) The Coronavirus on the Respiratory Panel, DOES NOT test for the novel  Coronavirus (2019 nCoV)    Coronavirus HKU1 NOT DETECTED NOT DETECTED Final   Coronavirus NL63 NOT DETECTED NOT DETECTED Final   Coronavirus OC43 NOT DETECTED NOT DETECTED Final   Metapneumovirus NOT DETECTED NOT DETECTED Final   Rhinovirus / Enterovirus NOT DETECTED NOT DETECTED Final   Influenza A NOT DETECTED NOT DETECTED Final   Influenza B NOT DETECTED NOT DETECTED Final   Parainfluenza Virus 1 NOT DETECTED NOT DETECTED Final   Parainfluenza Virus 2 NOT DETECTED NOT DETECTED Final   Parainfluenza Virus 3 NOT DETECTED NOT DETECTED Final   Parainfluenza Virus 4 NOT DETECTED NOT DETECTED Final   Respiratory Syncytial Virus NOT DETECTED NOT DETECTED Final   Bordetella pertussis NOT DETECTED NOT DETECTED Final   Chlamydophila pneumoniae NOT DETECTED NOT DETECTED Final   Mycoplasma pneumoniae NOT DETECTED NOT DETECTED Final    Comment: Performed at [facility], [address], [place], [place] [address]    Studies: Portable Chest 1 View  Result Date: 01/26/2019 CLINICAL DATA:  63 year old male with respiratory failure. Negative for COVID-19. EXAM: PORTABLE CHEST 1 VIEW COMPARISON:  01/25/2019 and earlier. FINDINGS: Portable AP upright view at 0523 hours. Underlying large lung volumes. Stable cardiac size and mediastinal contours. The right lung appears clear,  with evidence of upper lung emphysema. Right PICC line has been placed and tip is at the level of the lower SVC. Patchy and nodular left upper lung opacity has mildly regressed since yesterday. Stable left lung ventilation elsewhere. No pneumothorax or pleural effusion. IMPRESSION: 1. Right PICC line placed, tip at the lower SVC level. 2. Evidence of emphysema. Left upper lung opacity mildly regressed since yesterday. No new cardiopulmonary abnormality. Electronically Signed   By: Odessa Fleming M.D.   On: 01/26/2019 06:04   [place] Ekg Site Rite  Result Date: 01/25/2019 If Site Rite image not attached, placement could not be confirmed due to current cardiac rhythm.  Scheduled Meds: . budesonide (PULMICORT) nebulizer  solution  0.25 mg Nebulization BID  . Chlorhexidine Gluconate Cloth  6 each Topical Daily  . doxycycline  100 mg Oral Q12H  . enoxaparin (LOVENOX) injection  40 mg Subcutaneous Q24H  . fluticasone  2 spray Each Nare Daily  . guaiFENesin  600 mg Oral BID  . insulin aspart  0-20 Units Subcutaneous TID WC  . insulin aspart  10 Units Subcutaneous TID WC  . insulin glargine  40 Units Subcutaneous Daily  . ipratropium-albuterol  3 mL Nebulization Q4H  . loratadine  10 mg Oral Daily  . methylPREDNISolone (SOLU-MEDROL) injection  40 mg Intravenous Q4H  . montelukast  10 mg Oral QHS  . oxybutynin  5 mg Oral BID  . pantoprazole  40 mg Oral q morning - 10a  . rosuvastatin  40 mg Oral QHS  . sodium chloride flush  10-40 mL Intracatheter Q12H   Continuous Infusions:   Principal Problem:   Acute and chronic respiratory failure (acute-on-chronic) (HCC) Active Problems:   Acute on chronic respiratory failure with hypercapnia (HCC)   COPD exacerbation (HCC)   HTN (hypertension)   DM (diabetes mellitus) (HCC)   Tobacco abuse   HLD (hyperlipidemia)   GERD (gastroesophageal reflux disease)   Anxiety   Chronic respiratory failure with hypoxia (HCC)   Uncontrolled type 2 diabetes mellitus with  hyperglycemia (HCC)   COPD with acute exacerbation (HCC)   HCAP (healthcare-associated pneumonia)   On home O2   Elevated lactic acid level   Barnetta ChapelSylvester I Katherine Tout, MD [facility] 01/27/2019, 11:02 AM    LOS: 2 days  How to contact the [facility] Attending or Consulting provider 7A - 7P or covering provider during after hours 7P -7A, for this patient?  1. Check the care team in [facility] and look for a) attending/consulting [facility] provider listed and b) the [facility] team listed 2. Log into [email or web address] and use [place]'s universal password to access. If you do not have the password, please contact the hospital operator. 3. Locate the [facility] provider you are looking for under [facility] and page to a number that you can be directly reached. 4. If you still have difficulty reaching the provider, please page the [facility] (Director on Call) for the Hospitalists listed on amion for assistance.

## 2019-01-28 LAB — COMPREHENSIVE METABOLIC PANEL
ALT: 32 U/L (ref 0–44)
AST: 17 U/L (ref 15–41)
Albumin: 1.9 g/dL — ABNORMAL LOW (ref 3.5–5.0)
Alkaline Phosphatase: 122 U/L (ref 38–126)
Anion gap: 5 (ref 5–15)
BUN: 24 mg/dL — ABNORMAL HIGH (ref 8–23)
CO2: 30 mmol/L (ref 22–32)
Calcium: 9.1 mg/dL (ref 8.9–10.3)
Chloride: 98 mmol/L (ref 98–111)
Creatinine, Ser: 0.9 mg/dL (ref 0.61–1.24)
GFR calc Af Amer: >60 mL/min (ref >60)
GFR calc non Af Amer: >60 mL/min (ref >60)
Glucose, Bld: 301 mg/dL — ABNORMAL HIGH (ref 70–99)
Potassium: 4.3 mmol/L (ref 3.5–5.1)
Sodium: 133 mmol/L — ABNORMAL LOW (ref 135–145)
Total Bilirubin: 0.3 mg/dL (ref 0.3–1.2)
Total Protein: 5.7 g/dL — ABNORMAL LOW (ref 6.5–8.1)

## 2019-01-28 LAB — CBC WITH DIFFERENTIAL/PLATELET
Abs Immature Granulocytes: 0.22 10*3/uL — ABNORMAL HIGH (ref 0.00–0.07)
Basophils Absolute: 0 10*3/uL (ref 0.0–0.1)
Basophils Relative: 0 %
Eosinophils Absolute: 0 10*3/uL (ref 0.0–0.5)
Eosinophils Relative: 0 %
HCT: 29.7 % — ABNORMAL LOW (ref 39.0–52.0)
Hemoglobin: 9.3 g/dL — ABNORMAL LOW (ref 13.0–17.0)
Immature Granulocytes: 1 %
Lymphocytes Relative: 1 %
Lymphs Abs: 0.4 10*3/uL — ABNORMAL LOW (ref 0.7–4.0)
MCH: 27 pg (ref 26.0–34.0)
MCHC: 31.3 g/dL (ref 30.0–36.0)
MCV: 86.3 fL (ref 80.0–100.0)
Monocytes Absolute: 0.5 10*3/uL (ref 0.1–1.0)
Monocytes Relative: 2 %
Neutro Abs: 24 10*3/uL — ABNORMAL HIGH (ref 1.7–7.7)
Neutrophils Relative %: 96 %
Platelets: 286 10*3/uL (ref 150–400)
RBC: 3.44 MIL/uL — ABNORMAL LOW (ref 4.22–5.81)
RDW: 14.6 % (ref 11.5–15.5)
WBC: 25.1 10*3/uL — ABNORMAL HIGH (ref 4.0–10.5)
nRBC: 0 % (ref 0.0–0.2)

## 2019-01-28 LAB — GLUCOSE, CAPILLARY
Glucose-Capillary: 206 mg/dL — ABNORMAL HIGH (ref 70–99)
Glucose-Capillary: 291 mg/dL — ABNORMAL HIGH (ref 70–99)
Glucose-Capillary: 297 mg/dL — ABNORMAL HIGH (ref 70–99)
Glucose-Capillary: 148 mg/dL — ABNORMAL HIGH (ref 70–99)
Glucose-Capillary: 86 mg/dL (ref 70–99)

## 2019-01-28 LAB — BRAIN NATRIURETIC PEPTIDE: B Natriuretic Peptide: 83 pg/mL (ref 0.0–100.0)

## 2019-01-28 NOTE — Progress Notes (Signed)
PROGRESS NOTE    Jeff Ray  ZDG:[id number]  DOB: 09/18/1956  DOA: 01/25/2019 PCP: [name], [name], MD   Brief Admission Hx: 63 year old smoker with home oxygen dependent COPD recently [facility] for pneumonia and COPD exacerbation had been doing fairly well at home but suddenly developed acute shortness of breath and hypoxia.  He was admitted with acute on chronic respiratory failure.  01/27/2019: Patient continues to improve.  Patient is on 4 L of supplemental oxygen.  Usually, patient is on 2 L of supplemental oxygen.  Will continue to optimize patient's care.  01/28/2019: Patient continues to improve.  Pulmonary input is appreciated.  Will titrate supplemental oxygen requirement downwards as tolerated to keep O2 sat greater than 91%.  Patient is currently on 4 L of supplemental oxygen and the O2 sat is 98%.  Transfer patient to stepdown unit.  Hemoglobin is dropped from 14 g/dL to 9.3 g/dL.  Will hold Lovenox.  Will check fecal occult blood.  MDM/Assessment & Plan:   Acute on chronic respiratory failure-suspect secondary to COPD exacerbation.  He seems to be responding very well to treatment.  He is off BiPAP now but it is standing by as he does have high risk for decompensation.  He remains on high flow nasal cannula.  He reports that he is breathing much better.  Plan to de-escalate antibiotics today. 01/28/2019: Continue to treat underlying COPD with exacerbation.  Patient is slowly improving.  See above.  COPD with acute exacerbation- he is on IV steroids antibiotics and neb treatments.  He reports that he is breathing better today.  He has been weaned off BiPAP and now on high flow nasal cannula and remains in the stepdown unit. 01/28/2019: Slowly improving.  Optimize COPD treatment.  Insulin requiring diabetes mellitus with hyperglycemia-he is having some hyperglycemia associated with steroids however he never received his Lantus dose yesterday.  I have reordered for  him to receive Lantus insulin this morning.  We have ordered prandial NovoLog coverage and supplemental sliding scale coverage.  Continue CBG testing 5 times per day. 09/28/2019: Continue to optimize.  Sepsis-has resolved with treatments (as per prior documentation).  Essential hypertension: Continue to optimize.  Tobacco abuse:  Anemia: Hemoglobin has dropped from 14 g/dL to 9.3 g/dL. Discontinue subcu Lovenox Check fecal occult blood.  DVT prophylaxis: Lovenox Code Status: Full Family Communication:  Disposition Plan: Moved to stepdown unit  Consultants:  Pulmonology Dr. Juanetta GoslingHawkins  Procedures:    Antimicrobials:  Meropenem 4/23-4/24  Vancomycin 4/23-4/24  Doxycycline 4/24 -   Subjective: No new complaints. Patient is slowly improving.  Objective: Vitals:   01/28/19 0757 01/28/19 0806 01/28/19 1104 01/28/19 1129  BP:      Pulse:   95   Resp:   17   Temp:   98 F (36.7 C)   TempSrc:   Oral   SpO2: 91% 97% 96% 93%  Weight:      Height:        Intake/Output Summary (Last 24 hours) at 01/28/2019 1149 Last data filed at 01/28/2019 0600 Gross per 24 hour  Intake 960 ml  Output 1200 ml  Net -240 ml   Filed Weights   01/26/19 0500 01/27/19 0405 01/28/19 0500  Weight: 74.1 kg 75.8 kg 76.9 kg     REVIEW OF SYSTEMS  As per history otherwise all reviewed and reported negative  Exam:  General exam: Not in any distress.  Awake and alert. Respiratory system: Decreased air entry.  Inspiratory and  expiratory wheeze. Cardiovascular system: S1 & S2 heard.  Gastrointestinal system: Abdomen is nondistended, soft and nontender. Normal bowel sounds heard. Central nervous system: Alert and oriented. No focal neurological deficits. Extremities: no CCE.  Data Reviewed: Basic Metabolic Panel: Recent Labs  Lab 01/25/19 0757 01/26/19 0542 01/27/19 0412 01/28/19 0640  NA 133* 133* 133* 133*  K 3.9 4.4 4.4 4.3  CL 89* 98 98 98  CO2 30 26 29 30   GLUCOSE 161*  252* 243* 301*  BUN 15 16 18  24*  CREATININE 0.94 0.73 0.79 0.90  CALCIUM 9.9 8.9 9.2 9.1  MG  --  2.1 2.1  --    Liver Function Tests: Recent Labs  Lab 01/25/19 0757 01/26/19 0542 01/27/19 0412 01/28/19 0640  AST 18 12* 15 17  ALT 42 30 26 32  ALKPHOS 152* 133* 121 122  BILITOT 0.6 0.5 0.1* 0.3  PROT 7.7 5.7* 5.3* 5.7*  ALBUMIN 2.8* 1.9* 1.7* 1.9*   No results for input(s): LIPASE, AMYLASE in the last 168 hours. No results for input(s): AMMONIA in the last 168 hours. CBC: Recent Labs  Lab 01/25/19 0757 01/26/19 0542 01/27/19 0412 01/28/19 0640  WBC 30.7* 32.2* 30.6* 25.1*  NEUTROABS 26.4* 31.1* 29.4* 24.0*  HGB 14.4 10.6* 9.5* 9.3*  HCT 46.9 35.3* 31.0* 29.7*  MCV 87.0 88.5 87.6 86.3  PLT 304 233 260 286   Cardiac Enzymes: No results for input(s): CKTOTAL, CKMB, CKMBINDEX, TROPONINI in the last 168 hours. CBG (last 3)  Recent Labs    01/28/19 0318 01/28/19 0730 01/28/19 1105  GLUCAP 206* 291* 297*   Recent Results (from the past 240 hour(s))  Blood Culture (routine x 2)     Status: None (Preliminary result)   Collection Time: 01/25/19  7:58 AM  Result Value Ref Range Status   Specimen Description RIGHT ANTECUBITAL  Final   Special Requests   Final    BOTTLES DRAWN AEROBIC AND ANAEROBIC Blood Culture adequate volume   Culture   Final    NO GROWTH 3 DAYS Performed at [facility], [address], [place], [place] [address]    Report Status PENDING  Incomplete  Blood Culture (routine x 2)     Status: None (Preliminary result)   Collection Time: 01/25/19  9:01 AM  Result Value Ref Range Status   Specimen Description RIGHT ANTECUBITAL  Final   Special Requests   Final    BOTTLES DRAWN AEROBIC AND ANAEROBIC Blood Culture adequate volume   Culture   Final    NO GROWTH 3 DAYS Performed at [facility], [address], [place], [place] [address]    Report Status PENDING  Incomplete  SARS Coronavirus 2 [facility] order, Performed in [facility] hospital lab)      Status: None   Collection Time: 01/25/19 10:51 AM  Result Value Ref Range Status   SARS Coronavirus 2 NEGATIVE NEGATIVE Final    Comment: (NOTE) If result is NEGATIVE SARS-CoV-2 target nucleic acids are NOT DETECTED. The SARS-CoV-2 RNA is generally detectable in upper and lower  respiratory specimens during the acute phase of infection. The lowest  concentration of SARS-CoV-2 viral copies this assay can detect is 250  copies / mL. A negative result does not preclude SARS-CoV-2 infection  and should not be used as the sole basis for treatment or other  patient management decisions.  A negative result may occur with  improper specimen collection / handling, submission of specimen other  than nasopharyngeal swab, presence of viral mutation(s)  within the  areas targeted by this assay, and inadequate number of viral copies  (<250 copies / mL). A negative result must be combined with clinical  observations, patient history, and epidemiological information. If result is POSITIVE SARS-CoV-2 target nucleic acids are DETECTED. The SARS-CoV-2 RNA is generally detectable in upper and lower  respiratory specimens dur ing the acute phase of infection.  Positive  results are indicative of active infection with SARS-CoV-2.  Clinical  correlation with patient history and other diagnostic information is  necessary to determine patient infection status.  Positive results do  not rule out bacterial infection or co-infection with other viruses. If result is PRESUMPTIVE POSTIVE SARS-CoV-2 nucleic acids MAY BE PRESENT.   A presumptive positive result was obtained on the submitted specimen  and confirmed on repeat testing.  While 2019 novel coronavirus  (SARS-CoV-2) nucleic acids may be present in the submitted sample  additional confirmatory testing may be necessary for epidemiological  and / or clinical management purposes  to differentiate between  SARS-CoV-2 and other Sarbecovirus currently known to  infect humans.  If clinically indicated additional testing with an alternate test  methodology [id number]) is advised. The SARS-CoV-2 RNA is generally  detectable in upper and lower respiratory sp ecimens during the acute  phase of infection. The expected result is Negative. Fact Sheet for Patients:  BoilerBrush.com.cy Fact Sheet for Healthcare Providers: https://pope.com/ This test is not yet approved or cleared by the [place] FDA and has been authorized for detection and/or diagnosis of SARS-CoV-2 by FDA under an Emergency Use Authorization (EUA).  This EUA will remain in effect (meaning this test can be used) for the duration of the COVID-19 declaration under Section 564(b)(1) of the Act, 21 U.S.C. section 360bbb-3(b)(1), unless the authorization is terminated or revoked sooner. Performed at [facility], [address] [place], [place] [address]   Respiratory Panel by PCR     Status: None   Collection Time: 01/25/19 12:32 PM  Result Value Ref Range Status   Adenovirus NOT DETECTED NOT DETECTED Final   Coronavirus 229E NOT DETECTED NOT DETECTED Final    Comment: (NOTE) The Coronavirus on the Respiratory Panel, DOES NOT test for the novel  Coronavirus (2019 nCoV)    Coronavirus HKU1 NOT DETECTED NOT DETECTED Final   Coronavirus NL63 NOT DETECTED NOT DETECTED Final   Coronavirus OC43 NOT DETECTED NOT DETECTED Final   Metapneumovirus NOT DETECTED NOT DETECTED Final   Rhinovirus / Enterovirus NOT DETECTED NOT DETECTED Final   Influenza A NOT DETECTED NOT DETECTED Final   Influenza B NOT DETECTED NOT DETECTED Final   Parainfluenza Virus 1 NOT DETECTED NOT DETECTED Final   Parainfluenza Virus 2 NOT DETECTED NOT DETECTED Final   Parainfluenza Virus 3 NOT DETECTED NOT DETECTED Final   Parainfluenza Virus 4 NOT DETECTED NOT DETECTED Final   Respiratory Syncytial Virus NOT DETECTED NOT DETECTED Final   Bordetella pertussis NOT  DETECTED NOT DETECTED Final   Chlamydophila pneumoniae NOT DETECTED NOT DETECTED Final   Mycoplasma pneumoniae NOT DETECTED NOT DETECTED Final    Comment: Performed at [facility], [address], [place], [place] [address]    Studies: No results found. Scheduled Meds: . budesonide (PULMICORT) nebulizer solution  0.25 mg Nebulization BID  . Chlorhexidine Gluconate Cloth  6 each Topical Daily  . doxycycline  100 mg Oral Q12H  . enoxaparin (LOVENOX) injection  40 mg Subcutaneous Q24H  . fluticasone  2 spray Each Nare Daily  . guaiFENesin  600 mg Oral BID  . insulin aspart  0-20 Units Subcutaneous TID WC  . insulin aspart  10 Units Subcutaneous TID WC  . insulin glargine  40 Units Subcutaneous Daily  . ipratropium-albuterol  3 mL Nebulization Q4H  . loratadine  10 mg Oral Daily  . methylPREDNISolone (SOLU-MEDROL) injection  40 mg Intravenous Q4H  . montelukast  10 mg Oral QHS  . oxybutynin  5 mg Oral BID  . pantoprazole  40 mg Oral q morning - 10a  . rosuvastatin  40 mg Oral QHS  . sodium chloride flush  10-40 mL Intracatheter Q12H   Continuous Infusions:   Principal Problem:   Acute and chronic respiratory failure (acute-on-chronic) (HCC) Active Problems:   Acute on chronic respiratory failure with hypercapnia (HCC)   COPD exacerbation (HCC)   HTN (hypertension)   DM (diabetes mellitus) (HCC)   Tobacco abuse   HLD (hyperlipidemia)   GERD (gastroesophageal reflux disease)   Anxiety   Chronic respiratory failure with hypoxia (HCC)   Uncontrolled type 2 diabetes mellitus with hyperglycemia (HCC)   COPD with acute exacerbation (HCC)   HCAP (healthcare-associated pneumonia)   On home O2   Elevated lactic acid level   Barnetta Chapel, MD [facility] 01/28/2019, 11:49 AM    LOS: 3 days  How to contact the [facility] Attending or Consulting provider 7A - 7P or covering provider during after hours 7P -7A, for this patient?  1. Check the care team in [facility] and look  for a) attending/consulting TR[facility] provider listed and b) the [facility] team listed 2. Log into [email or web address] and use [place]'s universal password to access. If you do not have the password, please contact the hospital operator. 3. Locate the [facility] provider you are looking for under [facility] and page to a number that you can be directly reached. 4. If you still have difficulty reaching the provider, please page the [facility] (Director on Call) for the Hospitalists listed on amion for assistance.

## 2019-01-28 NOTE — Progress Notes (Signed)
Cathflo initial 30 min dwell did not leave line patient.  Second 90 min dwell resulted in patient PICC.  PICC flushed with 40 ml (20 ml per lumen) with blood return present both ports.

## 2019-01-28 NOTE — Progress Notes (Signed)
Subjective: He says he feels better.  He did 14/6 BiPAP last night with 4 L and did well.  He has no other new complaints.  He is coughing up some sputum now.  He says his breathing is better.  He does not do a lot of walking at home and uses a power wheelchair.  Objective: Vital signs in last 24 hours: Temp:  [97.4 F (36.3 C)-98.2 F (36.8 C)] 97.4 F (36.3 C) (04/26 0729) Pulse Rate:  [91-108] 96 (04/26 0729) Resp:  [15-28] 26 (04/26 0729) BP: (102-127)/(58-75) 127/68 (04/26 0500) SpO2:  [82 %-100 %] 97 % (04/26 0806) Weight:  [76.9 kg] 76.9 kg (04/26 0500) Weight change: 1.1 kg Last BM Date: 01/26/19  Intake/Output from previous day: 04/25 0701 - 04/26 0700 In: 1320 [P.O.:1320] Out: 1200 [Urine:1200]  PHYSICAL EXAM General appearance: alert, cooperative and no distress Resp: rhonchi bilaterally Cardio: regular rate and rhythm, S1, S2 normal, no murmur, click, rub or gallop GI: soft, non-tender; bowel sounds normal; no masses,  no organomegaly Extremities: extremities normal, atraumatic, no cyanosis or edema  Lab Results:  Results for orders placed or performed during the hospital encounter of 01/25/19 (from the past 48 hour(s))  Glucose, capillary     Status: Abnormal   Collection Time: 01/26/19 11:06 AM  Result Value Ref Range   Glucose-Capillary 395 (H) 70 - 99 mg/dL  Glucose, capillary     Status: Abnormal   Collection Time: 01/26/19  4:04 PM  Result Value Ref Range   Glucose-Capillary 148 (H) 70 - 99 mg/dL  Glucose, capillary     Status: Abnormal   Collection Time: 01/26/19  9:18 PM  Result Value Ref Range   Glucose-Capillary 100 (H) 70 - 99 mg/dL   Comment 1 Notify RN    Comment 2 Document in Chart   Procalcitonin     Status: None   Collection Time: 01/27/19  4:12 AM  Result Value Ref Range   Procalcitonin 0.22 ng/mL    Comment:        Interpretation: PCT (Procalcitonin) <= 0.5 ng/mL: Systemic infection (sepsis) is not likely. Local bacterial infection  is possible. (NOTE)       Sepsis PCT Algorithm           Lower Respiratory Tract                                      Infection PCT Algorithm    ----------------------------     ----------------------------         PCT < 0.25 ng/mL                PCT < 0.10 ng/mL         Strongly encourage             Strongly discourage   discontinuation of antibiotics    initiation of antibiotics    ----------------------------     -----------------------------       PCT 0.25 - 0.50 ng/mL            PCT 0.10 - 0.25 ng/mL               OR       >80% decrease in PCT            Discourage initiation of  antibiotics      Encourage discontinuation           of antibiotics    ----------------------------     -----------------------------         PCT >= 0.50 ng/mL              PCT 0.26 - 0.50 ng/mL               AND        <80% decrease in PCT             Encourage initiation of                                             antibiotics       Encourage continuation           of antibiotics    ----------------------------     -----------------------------        PCT >= 0.50 ng/mL                  PCT > 0.50 ng/mL               AND         increase in PCT                  Strongly encourage                                      initiation of antibiotics    Strongly encourage escalation           of antibiotics                                     -----------------------------                                           PCT <= 0.25 ng/mL                                                 OR                                        > 80% decrease in PCT                                     Discontinue / Do not initiate                                             antibiotics Performed at [facility], [address] [place], [place] [address]   Comprehensive metabolic panel     Status: Abnormal   Collection Time: 01/27/19  4:12  AM  Result Value Ref Range   Sodium 133 (L)  135 - 145 mmol/L   Potassium 4.4 3.5 - 5.1 mmol/L   Chloride 98 98 - 111 mmol/L   CO2 29 22 - 32 mmol/L   Glucose, Bld 243 (H) 70 - 99 mg/dL   BUN 18 8 - 23 mg/dL   Creatinine, Ser 1.61 0.61 - 1.24 mg/dL   Calcium 9.2 8.9 - 09.6 mg/dL   Total Protein 5.3 (L) 6.5 - 8.1 g/dL   Albumin 1.7 (L) 3.5 - 5.0 g/dL   AST 15 15 - 41 U/L   ALT 26 0 - 44 U/L   Alkaline Phosphatase 121 38 - 126 U/L   Total Bilirubin 0.1 (L) 0.3 - 1.2 mg/dL   GFR calc non Af Amer >60 >60 mL/min   GFR calc Af Amer >60 >60 mL/min   Anion gap 6 5 - 15    Comment: Performed at [facility], [address] [place], [place] [address]  Magnesium     Status: None   Collection Time: 01/27/19  4:12 AM  Result Value Ref Range   Magnesium 2.1 1.7 - 2.4 mg/dL    Comment: Performed at [facility], [address], [place], [place] [address]  CBC WITH DIFFERENTIAL     Status: Abnormal   Collection Time: 01/27/19  4:12 AM  Result Value Ref Range   WBC 30.6 (H) 4.0 - 10.5 K/uL   RBC 3.54 (L) 4.22 - 5.81 MIL/uL   Hemoglobin 9.5 (L) 13.0 - 17.0 g/dL   HCT 14.7 (L) 82.9 - 56.2 %   MCV 87.6 80.0 - 100.0 fL   MCH 26.8 26.0 - 34.0 pg   MCHC 30.6 30.0 - 36.0 g/dL   RDW 13.0 86.5 - 78.4 %   Platelets 260 150 - 400 K/uL   nRBC 0.0 0.0 - 0.2 %   Neutrophils Relative % 96 %   Neutro Abs 29.4 (H) 1.7 - 7.7 K/uL   Lymphocytes Relative 2 %   Lymphs Abs 0.5 (L) 0.7 - 4.0 K/uL   Monocytes Relative 1 %   Monocytes Absolute 0.4 0.1 - 1.0 K/uL   Eosinophils Relative 0 %   Eosinophils Absolute 0.0 0.0 - 0.5 K/uL   Basophils Relative 0 %   Basophils Absolute 0.0 0.0 - 0.1 K/uL   WBC Morphology TOXIC GRANULATION     Comment: VACUOLATED NEUTROPHILS   Immature Granulocytes 1 %   Abs Immature Granulocytes 0.38 (H) 0.00 - 0.07 K/uL    Comment: Performed at [facility], [address] [place], [place] [address]  Brain natriuretic peptide     Status: Abnormal   Collection Time: 01/27/19  4:12 AM  Result Value Ref Range   B Natriuretic  Peptide 121.0 (H) 0.0 - 100.0 pg/mL    Comment: Performed at [facility], [address] [place], [place] [address]  Glucose, capillary     Status: Abnormal   Collection Time: 01/27/19  7:53 AM  Result Value Ref Range   Glucose-Capillary 226 (H) 70 - 99 mg/dL  Glucose, capillary     Status: Abnormal   Collection Time: 01/27/19 11:27 AM  Result Value Ref Range   Glucose-Capillary 205 (H) 70 - 99 mg/dL  Glucose, capillary     Status: Abnormal   Collection Time: 01/27/19  4:22 PM  Result Value Ref Range   Glucose-Capillary 138 (H) 70 - 99 mg/dL  Glucose, capillary     Status: Abnormal   Collection Time:  01/27/19 10:57 PM  Result Value Ref Range   Glucose-Capillary 173 (H) 70 - 99 mg/dL   Comment 1 Notify RN    Comment 2 Document in Chart   Glucose, capillary     Status: Abnormal   Collection Time: 01/28/19  3:18 AM  Result Value Ref Range   Glucose-Capillary 206 (H) 70 - 99 mg/dL   Comment 1 Notify RN    Comment 2 Document in Chart   Comprehensive metabolic panel     Status: Abnormal   Collection Time: 01/28/19  6:40 AM  Result Value Ref Range   Sodium 133 (L) 135 - 145 mmol/L   Potassium 4.3 3.5 - 5.1 mmol/L   Chloride 98 98 - 111 mmol/L   CO2 30 22 - 32 mmol/L   Glucose, Bld 301 (H) 70 - 99 mg/dL   BUN 24 (H) 8 - 23 mg/dL   Creatinine, Ser 1.61 0.61 - 1.24 mg/dL   Calcium 9.1 8.9 - 09.6 mg/dL   Total Protein 5.7 (L) 6.5 - 8.1 g/dL   Albumin 1.9 (L) 3.5 - 5.0 g/dL   AST 17 15 - 41 U/L   ALT 32 0 - 44 U/L   Alkaline Phosphatase 122 38 - 126 U/L   Total Bilirubin 0.3 0.3 - 1.2 mg/dL   GFR calc non Af Amer >60 >60 mL/min   GFR calc Af Amer >60 >60 mL/min   Anion gap 5 5 - 15    Comment: Performed at [facility], [address], [place], [place] [address]  CBC WITH DIFFERENTIAL     Status: Abnormal   Collection Time: 01/28/19  6:40 AM  Result Value Ref Range   WBC 25.1 (H) 4.0 - 10.5 K/uL    Comment: WHITE COUNT CONFIRMED ON SMEAR   RBC 3.44 (L) 4.22 - 5.81 MIL/uL    Hemoglobin 9.3 (L) 13.0 - 17.0 g/dL   HCT 98.1 (L) 19.1 - 47.8 %   MCV 86.3 80.0 - 100.0 fL   MCH 27.0 26.0 - 34.0 pg   MCHC 31.3 30.0 - 36.0 g/dL   RDW 29.5 62.1 - 30.8 %   Platelets 286 150 - 400 K/uL   nRBC 0.0 0.0 - 0.2 %   Neutrophils Relative % 96 %   Neutro Abs 24.0 (H) 1.7 - 7.7 K/uL   Lymphocytes Relative 1 %   Lymphs Abs 0.4 (L) 0.7 - 4.0 K/uL   Monocytes Relative 2 %   Monocytes Absolute 0.5 0.1 - 1.0 K/uL   Eosinophils Relative 0 %   Eosinophils Absolute 0.0 0.0 - 0.5 K/uL   Basophils Relative 0 %   Basophils Absolute 0.0 0.0 - 0.1 K/uL   WBC Morphology TOXIC GRANULATION     Comment: VACUOLATED NEUTROPHILS   Immature Granulocytes 1 %   Abs Immature Granulocytes 0.22 (H) 0.00 - 0.07 K/uL    Comment: Performed at [facility], [address], [place], [place] [address]  Brain natriuretic peptide     Status: None   Collection Time: 01/28/19  6:40 AM  Result Value Ref Range   B Natriuretic Peptide 83.0 0.0 - 100.0 pg/mL    Comment: Performed at [facility], [address] [place], [place] [address]  Glucose, capillary     Status: Abnormal   Collection Time: 01/28/19  7:30 AM  Result Value Ref Range   Glucose-Capillary 291 (H) 70 - 99 mg/dL    ABGS Recent Labs    01/25/19 0950  PHART 7.452*  PO2ART  59.7*  HCO3 32.3*   CULTURES Recent Results (from the past 240 hour(s))  Blood Culture (routine x 2)     Status: None (Preliminary result)   Collection Time: 01/25/19  7:58 AM  Result Value Ref Range Status   Specimen Description RIGHT ANTECUBITAL  Final   Special Requests   Final    BOTTLES DRAWN AEROBIC AND ANAEROBIC Blood Culture adequate volume   Culture   Final    NO GROWTH 3 DAYS Performed at [facility], [address] [place], [place] [address]    Report Status PENDING  Incomplete  Blood Culture (routine x 2)     Status: None (Preliminary result)   Collection Time: 01/25/19  9:01 AM  Result Value Ref Range Status   Specimen Description RIGHT  ANTECUBITAL  Final   Special Requests   Final    BOTTLES DRAWN AEROBIC AND ANAEROBIC Blood Culture adequate volume   Culture   Final    NO GROWTH 3 DAYS Performed at [facility], [address] [place], [place] [address]    Report Status PENDING  Incomplete  SARS Coronavirus 2 [facility] order, Performed in [facility] hospital lab)     Status: None   Collection Time: 01/25/19 10:51 AM  Result Value Ref Range Status   SARS Coronavirus 2 NEGATIVE NEGATIVE Final    Comment: (NOTE) If result is NEGATIVE SARS-CoV-2 target nucleic acids are NOT DETECTED. The SARS-CoV-2 RNA is generally detectable in upper and lower  respiratory specimens during the acute phase of infection. The lowest  concentration of SARS-CoV-2 viral copies this assay can detect is 250  copies / mL. A negative result does not preclude SARS-CoV-2 infection  and should not be used as the sole basis for treatment or other  patient management decisions.  A negative result may occur with  improper specimen collection / handling, submission of specimen other  than nasopharyngeal swab, presence of viral mutation(s) within the  areas targeted by this assay, and inadequate number of viral copies  (<250 copies / mL). A negative result must be combined with clinical  observations, patient history, and epidemiological information. If result is POSITIVE SARS-CoV-2 target nucleic acids are DETECTED. The SARS-CoV-2 RNA is generally detectable in upper and lower  respiratory specimens dur ing the acute phase of infection.  Positive  results are indicative of active infection with SARS-CoV-2.  Clinical  correlation with patient history and other diagnostic information is  necessary to determine patient infection status.  Positive results do  not rule out bacterial infection or co-infection with other viruses. If result is PRESUMPTIVE POSTIVE SARS-CoV-2 nucleic acids MAY BE PRESENT.   A presumptive positive result was obtained on the  submitted specimen  and confirmed on repeat testing.  While 2019 novel coronavirus  (SARS-CoV-2) nucleic acids may be present in the submitted sample  additional confirmatory testing may be necessary for epidemiological  and / or clinical management purposes  to differentiate between  SARS-CoV-2 and other Sarbecovirus currently known to infect humans.  If clinically indicated additional testing with an alternate test  methodology [id number]) is advised. The SARS-CoV-2 RNA is generally  detectable in upper and lower respiratory sp ecimens during the acute  phase of infection. The expected result is Negative. Fact Sheet for Patients:  BoilerBrush.com.cy Fact Sheet for Healthcare Providers: https://pope.com/ This test is not yet approved or cleared by the [place] FDA and has been authorized for detection and/or diagnosis of SARS-CoV-2 by FDA under an Emergency Use Authorization (  EUA).  This EUA will remain in effect (meaning this test can be used) for the duration of the COVID-19 declaration under Section 564(b)(1) of the Act, 21 U.S.C. section 360bbb-3(b)(1), unless the authorization is terminated or revoked sooner. Performed at [facility], [address] [place], [place] [address]   Respiratory Panel by PCR     Status: None   Collection Time: 01/25/19 12:32 PM  Result Value Ref Range Status   Adenovirus NOT DETECTED NOT DETECTED Final   Coronavirus 229E NOT DETECTED NOT DETECTED Final    Comment: (NOTE) The Coronavirus on the Respiratory Panel, DOES NOT test for the novel  Coronavirus (2019 nCoV)    Coronavirus HKU1 NOT DETECTED NOT DETECTED Final   Coronavirus NL63 NOT DETECTED NOT DETECTED Final   Coronavirus OC43 NOT DETECTED NOT DETECTED Final   Metapneumovirus NOT DETECTED NOT DETECTED Final   Rhinovirus / Enterovirus NOT DETECTED NOT DETECTED Final   Influenza A NOT DETECTED NOT DETECTED Final   Influenza B NOT DETECTED  NOT DETECTED Final   Parainfluenza Virus 1 NOT DETECTED NOT DETECTED Final   Parainfluenza Virus 2 NOT DETECTED NOT DETECTED Final   Parainfluenza Virus 3 NOT DETECTED NOT DETECTED Final   Parainfluenza Virus 4 NOT DETECTED NOT DETECTED Final   Respiratory Syncytial Virus NOT DETECTED NOT DETECTED Final   Bordetella pertussis NOT DETECTED NOT DETECTED Final   Chlamydophila pneumoniae NOT DETECTED NOT DETECTED Final   Mycoplasma pneumoniae NOT DETECTED NOT DETECTED Final    Comment: Performed at [facility], [address], [place], [place] [address]   Studies/Results: No results found.  Medications:  Prior to Admission:  Medications Prior to Admission  Medication Sig Dispense Refill Last Dose  . albuterol (PROAIR HFA) 108 (90 BASE) MCG/ACT inhaler Inhale 2 puffs into the lungs every 6 (six) hours as needed for wheezing or shortness of breath. Shortness of Breath   unknown  . ALPRAZolam (XANAX) 1 MG tablet Take 0.5 tablets (0.5 mg total) by mouth 2 (two) times daily as needed for anxiety.  0   . fexofenadine-pseudoephedrine (ALLEGRA-D 24) 180-240 MG 24 hr tablet Take 1 tablet by mouth daily.   01/09/2019 at Unknown time  . fluticasone (FLONASE) 50 MCG/ACT nasal spray Place 2 sprays into both nostrils daily.   01/08/2019 at Unknown time  . glimepiride (AMARYL) 2 MG tablet Take 2 mg by mouth daily with breakfast.   01/08/2019 at Unknown time  . Guaifenesin (MUCINEX MAXIMUM STRENGTH) 1200 MG TB12 Take 1 tablet (1,200 mg total) by mouth 2 (two) times daily. 20 each 0 01/09/2019 at Unknown time  . Insulin Glargine (LANTUS) 100 UNIT/ML Solostar Pen Inject 30 Units into the skin daily. 15 mL 2   . ipratropium-albuterol (DUONEB) 0.5-2.5 (3) MG/3ML SOLN Take 3 mLs by nebulization every 4 (four) hours as needed. For shortness of breath   01/09/2019 at Unknown time  . metFORMIN (GLUCOPHAGE-XR) 500 MG 24 hr tablet Take 500 mg by mouth 2 (two) times daily.    01/09/2019 at Unknown time  . montelukast  (SINGULAIR) 10 MG tablet Take 10 mg by mouth every morning.    01/08/2019 at Unknown time  . Multiple Vitamins-Minerals (CENTRUM SILVER 50+MEN PO) Take 1 tablet by mouth daily.   01/09/2019 at Unknown time  . oxybutynin (DITROPAN) 5 MG tablet Take 1 tablet (5 mg total) by mouth 2 (two) times daily.     . pantoprazole (PROTONIX) 40 MG tablet Take 40 mg by mouth every morning.  01/08/2019 at Unknown time  . predniSONE (DELTASONE) 20 MG tablet Take 1-3 tablets (20-60 mg total) by mouth daily with breakfast. Take  daily for 2days then  daily for 2 days then  daily for 2days then STOP 5 tablet 0   . rosuvastatin (CRESTOR) 40 MG tablet Take 40 mg by mouth at bedtime.    01/08/2019 at Unknown time  . TRELEGY ELLIPTA 100-62.5-25 MCG/INH AEPB Take 2 puffs by mouth daily.   01/08/2019 at Unknown time   Scheduled: . budesonide (PULMICORT) nebulizer solution  0.25 mg Nebulization BID  . Chlorhexidine Gluconate Cloth  6 each Topical Daily  . doxycycline  100 mg Oral Q12H  . enoxaparin (LOVENOX) injection  40 mg Subcutaneous Q24H  . fluticasone  2 spray Each Nare Daily  . guaiFENesin  600 mg Oral BID  . insulin aspart  0-20 Units Subcutaneous TID WC  . insulin aspart  10 Units Subcutaneous TID WC  . insulin glargine  40 Units Subcutaneous Daily  . ipratropium-albuterol  3 mL Nebulization Q4H  . loratadine  10 mg Oral Daily  . methylPREDNISolone (SOLU-MEDROL) injection  40 mg Intravenous Q4H  . montelukast  10 mg Oral QHS  . oxybutynin  5 mg Oral BID  . pantoprazole  40 mg Oral q morning - 10a  . rosuvastatin  40 mg Oral QHS  . sodium chloride flush  10-40 mL Intracatheter Q12H   Continuous:  [id number], ondansetron **OR** ondansetron (ZOFRAN) IV, sodium chloride flush, traZODone  Assesment: He was admitted with acute on chronic hypoxic and hypercapnic respiratory failure.  He is done well on BiPAP.  He apparently has had BiPAP at home but somehow has lost the machine.  He had been in the  hospital in [place] with pneumonia respiratory failure requiring intubation and mechanical ventilation and there was concern that he was going to need mechanical ventilation now but he has improved.  He is coughing up some sputum.  He has healthcare associated pneumonia on appropriate treatment.  He has severe COPD at baseline unchanged  He has diabetes and is on sliding scale  He apparently has very poor function of his lower extremities and is very weak.  He says at home he can walk short distances. Principal Problem:   Acute and chronic respiratory failure (acute-on-chronic) (HCC) Active Problems:   Acute on chronic respiratory failure with hypercapnia (HCC)   COPD exacerbation (HCC)   HTN (hypertension)   DM (diabetes mellitus) (HCC)   Tobacco abuse   HLD (hyperlipidemia)   GERD (gastroesophageal reflux disease)   Anxiety   Chronic respiratory failure with hypoxia (HCC)   Uncontrolled type 2 diabetes mellitus with hyperglycemia (HCC)   COPD with acute exacerbation (HCC)   HCAP (healthcare-associated pneumonia)   On home O2   Elevated lactic acid level    Plan: From a strictly pulmonary point of view I think he is okay to go out of the stepdown unit.  He could still do BiPAP at night.  I took the liberty of ordering a physical therapy consult.  He will need input from case management regarding BiPAP at home    LOS: 3 days   Fredirick Maudlin 01/28/2019, 9:02 AM

## 2019-01-28 NOTE — Progress Notes (Signed)
Patient on [facility] BiPAP 14/6 oxygen 4 lpm bled in. Patient having no problems.

## 2019-01-29 LAB — GLUCOSE, CAPILLARY
Glucose-Capillary: 110 mg/dL — ABNORMAL HIGH (ref 70–99)
Glucose-Capillary: 155 mg/dL — ABNORMAL HIGH (ref 70–99)
Glucose-Capillary: 158 mg/dL — ABNORMAL HIGH (ref 70–99)
Glucose-Capillary: 121 mg/dL — ABNORMAL HIGH (ref 70–99)

## 2019-01-29 LAB — CBC WITH DIFFERENTIAL/PLATELET
Abs Immature Granulocytes: 0.24 10*3/uL — ABNORMAL HIGH (ref 0.00–0.07)
Basophils Absolute: 0 10*3/uL (ref 0.0–0.1)
Basophils Relative: 0 %
Eosinophils Absolute: 0 10*3/uL (ref 0.0–0.5)
Eosinophils Relative: 0 %
HCT: 30.6 % — ABNORMAL LOW (ref 39.0–52.0)
Hemoglobin: 9.3 g/dL — ABNORMAL LOW (ref 13.0–17.0)
Immature Granulocytes: 1 %
Lymphocytes Relative: 2 %
Lymphs Abs: 0.5 10*3/uL — ABNORMAL LOW (ref 0.7–4.0)
MCH: 26.7 pg (ref 26.0–34.0)
MCHC: 30.4 g/dL (ref 30.0–36.0)
MCV: 87.9 fL (ref 80.0–100.0)
Monocytes Absolute: 0.6 10*3/uL (ref 0.1–1.0)
Monocytes Relative: 2 %
Neutro Abs: 23.2 10*3/uL — ABNORMAL HIGH (ref 1.7–7.7)
Neutrophils Relative %: 95 %
Platelets: 285 10*3/uL (ref 150–400)
RBC: 3.48 MIL/uL — ABNORMAL LOW (ref 4.22–5.81)
RDW: 14.5 % (ref 11.5–15.5)
WBC: 24.5 10*3/uL — ABNORMAL HIGH (ref 4.0–10.5)
nRBC: 0 % (ref 0.0–0.2)

## 2019-01-29 LAB — COMPREHENSIVE METABOLIC PANEL
ALT: 30 U/L (ref 0–44)
AST: 16 U/L (ref 15–41)
Albumin: 1.9 g/dL — ABNORMAL LOW (ref 3.5–5.0)
Alkaline Phosphatase: 116 U/L (ref 38–126)
Anion gap: 6 (ref 5–15)
BUN: 23 mg/dL (ref 8–23)
CO2: 29 mmol/L (ref 22–32)
Calcium: 9.3 mg/dL (ref 8.9–10.3)
Chloride: 102 mmol/L (ref 98–111)
Creatinine, Ser: 0.79 mg/dL (ref 0.61–1.24)
GFR calc Af Amer: >60 mL/min (ref >60)
GFR calc non Af Amer: >60 mL/min (ref >60)
Glucose, Bld: 98 mg/dL (ref 70–99)
Potassium: 4 mmol/L (ref 3.5–5.1)
Sodium: 137 mmol/L (ref 135–145)
Total Bilirubin: 0.3 mg/dL (ref 0.3–1.2)
Total Protein: 5.5 g/dL — ABNORMAL LOW (ref 6.5–8.1)

## 2019-01-29 LAB — BRAIN NATRIURETIC PEPTIDE: B Natriuretic Peptide: 130 pg/mL — ABNORMAL HIGH (ref 0.0–100.0)

## 2019-01-29 MED ORDER — IPRATROPIUM-ALBUTEROL 0.5-2.5 (3) MG/3ML IN SOLN
3.0000 mL | Freq: Four times a day (QID) | RESPIRATORY_TRACT | Status: DC
  Administered 2019-01-29 – 2019-02-01 (×11): 3 mL via RESPIRATORY_TRACT
  Filled 2019-01-29 (×10): qty 3

## 2019-01-29 MED ORDER — BUDESONIDE 0.5 MG/2ML IN SUSP
0.5000 mg | Freq: Two times a day (BID) | RESPIRATORY_TRACT | Status: DC
  Administered 2019-01-29 – 2019-02-01 (×6): 0.5 mg via RESPIRATORY_TRACT
  Filled 2019-01-29 (×6): qty 2

## 2019-01-29 MED ORDER — ARFORMOTEROL TARTRATE 15 MCG/2ML IN NEBU
15.0000 ug | INHALATION_SOLUTION | Freq: Two times a day (BID) | RESPIRATORY_TRACT | Status: DC
  Administered 2019-01-29 – 2019-02-01 (×6): 15 ug via RESPIRATORY_TRACT
  Filled 2019-01-29 (×6): qty 2

## 2019-01-29 MED ORDER — METHYLPREDNISOLONE SODIUM SUCC 125 MG IJ SOLR
60.0000 mg | Freq: Four times a day (QID) | INTRAMUSCULAR | Status: DC
  Administered 2019-01-29 – 2019-01-30 (×5): 60 mg via INTRAVENOUS
  Filled 2019-01-29 (×5): qty 2

## 2019-01-29 MED ORDER — CYCLOBENZAPRINE HCL 10 MG PO TABS
10.0000 mg | ORAL_TABLET | Freq: Three times a day (TID) | ORAL | Status: DC | PRN
  Administered 2019-01-29: 10 mg via ORAL
  Filled 2019-01-29: qty 1

## 2019-01-29 MED ORDER — HYDROCODONE-ACETAMINOPHEN 5-325 MG PO TABS
2.0000 | ORAL_TABLET | Freq: Once | ORAL | Status: AC
  Administered 2019-01-29: 2 via ORAL
  Filled 2019-01-29: qty 2

## 2019-01-29 NOTE — Evaluation (Signed)
Physical Therapy Evaluation Patient Details Name: Jeff Ray MRN: [id number] DOB: 11/13/1955 Today's Date: 01/29/2019   History of Present Illness   Jeff Ray is a 63 y.o. male smoker with home oxygen dependent COPD was recently discharged from [facility] from a prolonged hospitalization for respiratory failure requiring intubation in the ICU, extubated on 01/10/2019 and discharged 01/12/2019 on a prednisone taper.  He was diagnosed with a pneumonia at that time and was in diabetic ketoacidosis with a hemoglobin A1c near 12%.  His coronavirus testing was negative at the time.  He showed improvement on IV steroids and bronchodilators and was eventually weaned down to 3 L of oxygen and discharged home on a prednisone taper with home health PT OT and RN.    Clinical Impression  Patient requires much time before attempting functional tasks due to severe SOB with SpO2 desaturation from 92% down to 86% with exertion.  Patient limited to 3-4 steps to transfer to chair and tolerated sitting up after therapy - RN notified.  Patient will benefit from continued physical therapy in hospital and recommended venue below to increase strength, balance, endurance for safe ADLs and gait.    Follow Up Recommendations Home health PT;Supervision for mobility/OOB;Supervision - Intermittent    Equipment Recommendations  None recommended by PT    Recommendations for Other Services       Precautions / Restrictions Precautions Precautions: Fall Precaution Comments: Home O2 dependent Restrictions Weight Bearing Restrictions: No      Mobility  Bed Mobility Overal bed mobility: Modified Independent             General bed mobility comments: increased time  Transfers Overall transfer level: Needs assistance Equipment used: None Transfers: Sit to/from Stand;Stand Pivot Transfers Sit to Stand: Min guard Stand pivot transfers: Min guard       General transfer comment: slow labored  movement, increased time  Ambulation/Gait Ambulation/Gait assistance: Min guard;Min assist Gait Distance (Feet): 3 Feet Assistive device: None Gait Pattern/deviations: Decreased step length - right;Decreased step length - left;Decreased stride length Gait velocity: slow   General Gait Details: limited to 3-4 labored steps at bedside mostly due to SOB  Stairs            Wheelchair Mobility    Modified Rankin (Stroke Patients Only)       Balance Overall balance assessment: Needs assistance Sitting-balance support: Feet supported;No upper extremity supported Sitting balance-Leahy Scale: Good     Standing balance support: During functional activity;No upper extremity supported Standing balance-Leahy Scale: Fair Standing balance comment: without AD                             Pertinent Vitals/Pain Pain Assessment: No/denies pain    Home Living Family/patient expects to be discharged to:: Private residence Living Arrangements: Spouse/significant other Available Help at Discharge: Family;Available 24 hours/day Type of Home: Mobile home Home Access: Ramped entrance     Home Layout: One level Home Equipment: Wheelchair - power;Bedside commode;Shower seat;Cane - single point      Prior Function Level of Independence: Needs assistance   Gait / Transfers Assistance Needed: primarily uses his power w/c, walks short distances with a cane to rooms in which his power chair does not fit  ADL's / Homemaking Assistance Needed: assisted by spouse  Comments: Home O2 dependent, uses 2-3 LPM most of time, increases it to 4 LPM with increased activity     Hand Dominance  Dominant Hand: Right    Extremity/Trunk Assessment   Upper Extremity Assessment Upper Extremity Assessment: Generalized weakness    Lower Extremity Assessment Lower Extremity Assessment: Generalized weakness    Cervical / Trunk Assessment Cervical / Trunk Assessment: Normal   Communication   Communication: No difficulties  Cognition Arousal/Alertness: Awake/alert Behavior During Therapy: WFL for tasks assessed/performed Overall Cognitive Status: Within Functional Limits for tasks assessed                                        General Comments      Exercises     Assessment/Plan    PT Assessment    PT Problem List Decreased strength;Decreased activity tolerance;Decreased balance;Decreased mobility;Decreased knowledge of use of DME;Cardiopulmonary status limiting activity       PT Treatment Interventions Therapeutic exercise;Gait training;Stair training;Functional mobility training;Therapeutic activities;Patient/family education    PT Goals (Current goals can be found in the Care Plan section)  Acute Rehab PT Goals Patient Stated Goal: return home PT Goal Formulation: With patient Time For Goal Achievement: 02/05/19 Potential to Achieve Goals: Good    Frequency Min 3X/week   Barriers to discharge        Co-evaluation               AM-PAC PT "6 Clicks" Mobility  Outcome Measure Help needed turning from your back to your side while in a flat bed without using bedrails?: None Help needed moving from lying on your back to sitting on the side of a flat bed without using bedrails?: None Help needed moving to and from a bed to a chair (including a wheelchair)?: A Little Help needed standing up from a chair using your arms (e.g., wheelchair or bedside chair)?: A Little Help needed to walk in hospital room?: A Lot Help needed climbing 3-5 steps with a railing? : A Lot 6 Click Score: 18    End of Session Equipment Utilized During Treatment: Oxygen Activity Tolerance: Patient limited by fatigue(Patient limited by SOB) Patient left: in chair;with call bell/phone within reach Nurse Communication: Mobility status      Time: 8299-3716 PT Time Calculation (min) (ACUTE ONLY): 33 min   Charges:   PT Evaluation $PT Eval  Moderate Complexity: 1 Mod PT Treatments $Therapeutic Activity: 23-37 mins        12:36 PM, 01/29/19 Ocie Bob, MPT Physical Therapist with [facility] [phone] office [phone] mobile phone

## 2019-01-29 NOTE — Progress Notes (Signed)
Inpatient Diabetes Program Recommendations  AACE/ADA: New Consensus Statement on Inpatient Glycemic Control (2015)  Target Ranges:  Prepandial:   less than 140 mg/dL      Peak postprandial:   less than 180 mg/dL (1-2 hours)      Critically ill patients:  140 - 180 mg/dL   Lab Results  Component Value Date   GLUCAP 110 (H) 01/29/2019   HGBA1C 11.1 (H) 01/16/2019    Review of Glycemic Control Results for RASTUS, NEIDHART (MRN [id number]) as of 01/29/2019 10:47  Ref. Range 01/28/2019 07:30 01/28/2019 11:05 01/28/2019 16:35 01/28/2019 21:00 01/29/2019 08:10  Glucose-Capillary Latest Ref Range: 70 - 99 mg/dL 323 (H) 557 (H) 322 (H) 86 110 (H)   Diabetes history:DM 2 Outpatient Diabetes medications:Glimepiride 2 mg Daily, Metformin 500 mg BID, Lantus 30 units QD Current orders for Inpatient glycemic control:Lantus 40 units QD, Novolog 0-15 units tid, Novolog 0-5 units qhs, Novolog 10 units tid meal coverage  Solumedrol 60 mg Q6H  Inpatient Diabetes Program Recommendations:    While on steroids and if post prandials continue to exceed 180 mg/dL, consider increasing Novolog 15 to TID (assuming that patient is eating >50% of meal).  Thanks, Lujean Rave, MSN, RNC-OB Diabetes Coordinator [phone] (8a-5p)

## 2019-01-29 NOTE — Progress Notes (Signed)
PROGRESS NOTE  Jeff Ray:[id number] DOB: 03/06/1956 DOA: 01/25/2019 PCP: Gareth Morgan, MD  Brief History:  63 year old male with a history of COPD, anxiety, diabetes mellitus, hypertension, chronic respiratory failure on 2-3 L at home presenting with shortness of breath.   He was noted to have a pulse ox of 84% on 4 L/min.  He complained of severe shortness of breath.  He was placed on a nonrebreather by EMS.  When he arrived in the emergency department he subsequently placed on high flow nasal cannula oxygen and subsequently placed on BiPAP.  He had repeat COVID-19 testing done that was again negative.  He was started on broad-spectrum antibiotics for presumed nosocomial pneumonia.  His lactic acid was elevated at 2.3.  He was bolused IV fluids.  His WBC was markedly elevated at 30.7 however he had been on a steroid taper.  He was noted to have a left upper lobe opacity on chest x-ray and fibrotic changes were seen.  His ABG revealed a pH of 7.452, PCO2 48.9, PO2 59.7.   The patient was recently discharge from the hospital after a stay from 01/16/2019 through 01/20/2019 where he was treated for acute on chronic respiratory failure secondary to pneumonia.  He was intubated at that time.  Assessment/Plan: Acute on chronic respiratory failure with hypoxia and hypercapnia -Secondary COPD exacerbation -Patient is normally on 2-3 L at home -Initially presented on BiPAP -Wean oxygen for saturation 88-92%  COPD exacerbation -Increase Pulmicort -Start Brovana -Continue Solu-Medrol -Continue duo nebs  Diabetes mellitus type 2, uncontrolled with hyperglycemia -01/16/2019 hemoglobin A1c 11.1 -Continue Lantus 40 units daily -NovoLog sliding scale -Continue pre-meal insulin  SIRS -Sepsis ruled out -Procalcitonin peaked at 0.30  Hyperlipidemia -Continue statin  Tobacco abuse, in remission -Patient states he has not smoked in 6 years  Leukocytosis -Likely combination of  stress demargination and steroids  Anxiety -Continue home dose alprazolam       Disposition Plan:   Home in 1-2 days  Family Communication:   No Family at bedside  Consultants:  pulmonary  Code Status:  FULL   DVT Prophylaxis:   [place] Lovenox   Procedures: As Listed in Progress Note Above  Antibiotics: merrem 4/23>>>4/24 Doxy 4/24>>>       Subjective: Patient is breathing better but still has some shortness of breath with minimal exertion.  He has a nonproductive cough.  Denies any nausea, vomiting, diarrhea, bone pain, denies chest pain, headache, fevers, chills.  There is no hemoptysis.  Objective: Vitals:   01/29/19 0600 01/29/19 0738 01/29/19 0800 01/29/19 0807  BP: 130/72  132/70   Pulse: 95  96   Resp: (!) 30  (!) 24   Temp:    (!) 97.5 F (36.4 C)  TempSrc:    Oral  SpO2: 95% 92% 93%   Weight:      Height:        Intake/Output Summary (Last 24 hours) at 01/29/2019 1008 Last data filed at 01/29/2019 0500 Gross per 24 hour  Intake --  Output 1200 ml  Net -1200 ml   Weight change: -1.8 kg Exam:   General:  Pt is alert, follows commands appropriately, not in acute distress  HEENT: No icterus, No thrush, No neck mass, [place]/AT  Cardiovascular: RRR, S1/S2, no rubs, no gallops  Respiratory:  bilateral scattered rhonchi.  Bilateral expiratory wheeze.  Abdomen: Soft/+BS, non tender, non distended, no guarding  Extremities: No edema, No lymphangitis, No  petechiae, No rashes, no synovitis   Data Reviewed: I have personally reviewed following labs and imaging studies Basic Metabolic Panel: Recent Labs  Lab 01/25/19 0757 01/26/19 0542 01/27/19 0412 01/28/19 0640 01/29/19 0513  NA 133* 133* 133* 133* 137  K 3.9 4.4 4.4 4.3 4.0  CL 89* 98 98 98 102  CO2 30 26 29 30 29   GLUCOSE 161* 252* 243* 301* 98  BUN 15 16 18  24* 23  CREATININE 0.94 0.73 0.79 0.90 0.79  CALCIUM 9.9 8.9 9.2 9.1 9.3  MG  --  2.1 2.1  --   --    Liver Function  Tests: Recent Labs  Lab 01/25/19 0757 01/26/19 0542 01/27/19 0412 01/28/19 0640 01/29/19 0513  AST 18 12* 15 17 16   ALT 42 30 26 32 30  ALKPHOS 152* 133* 121 122 116  BILITOT 0.6 0.5 0.1* 0.3 0.3  PROT 7.7 5.7* 5.3* 5.7* 5.5*  ALBUMIN 2.8* 1.9* 1.7* 1.9* 1.9*   No results for input(s): LIPASE, AMYLASE in the last 168 hours. No results for input(s): AMMONIA in the last 168 hours. Coagulation Profile: No results for input(s): INR, PROTIME in the last 168 hours. CBC: Recent Labs  Lab 01/25/19 0757 01/26/19 0542 01/27/19 0412 01/28/19 0640 01/29/19 0513  WBC 30.7* 32.2* 30.6* 25.1* 24.5*  NEUTROABS 26.4* 31.1* 29.4* 24.0* 23.2*  HGB 14.4 10.6* 9.5* 9.3* 9.3*  HCT 46.9 35.3* 31.0* 29.7* 30.6*  MCV 87.0 88.5 87.6 86.3 87.9  PLT 304 233 260 286 285   Cardiac Enzymes: No results for input(s): CKTOTAL, CKMB, CKMBINDEX, TROPONINI in the last 168 hours. BNP: Invalid input(s): POCBNP CBG: Recent Labs  Lab 01/28/19 0730 01/28/19 1105 01/28/19 1635 01/28/19 2100 01/29/19 0810  GLUCAP 291* 297* 148* 86 110*   HbA1C: No results for input(s): HGBA1C in the last 72 hours. Urine analysis:    Component Value Date/Time   COLORURINE YELLOW 01/25/2019 0743   APPEARANCEUR HAZY (A) 01/25/2019 0743   LABSPEC 1.027 01/25/2019 0743   PHURINE 7.0 01/25/2019 0743   GLUCOSEU 150 (A) 01/25/2019 0743   HGBUR NEGATIVE 01/25/2019 0743   BILIRUBINUR NEGATIVE 01/25/2019 0743   KETONESUR NEGATIVE 01/25/2019 0743   PROTEINUR 30 (A) 01/25/2019 0743   UROBILINOGEN 0.2 02/08/2008 1731   NITRITE NEGATIVE 01/25/2019 0743   LEUKOCYTESUR NEGATIVE 01/25/2019 0743   Sepsis Labs: @LABRCNTIP (procalcitonin:4,lacticidven:4) ) Recent Results (from the past 240 hour(s))  Blood Culture (routine x 2)     Status: None (Preliminary result)   Collection Time: 01/25/19  7:58 AM  Result Value Ref Range Status   Specimen Description RIGHT ANTECUBITAL  Final   Special Requests   Final    BOTTLES DRAWN  AEROBIC AND ANAEROBIC Blood Culture adequate volume   Culture   Final    NO GROWTH 4 DAYS Performed at [facility], [address] [place], [place] [address]    Report Status PENDING  Incomplete  Blood Culture (routine x 2)     Status: None (Preliminary result)   Collection Time: 01/25/19  9:01 AM  Result Value Ref Range Status   Specimen Description RIGHT ANTECUBITAL  Final   Special Requests   Final    BOTTLES DRAWN AEROBIC AND ANAEROBIC Blood Culture adequate volume   Culture   Final    NO GROWTH 4 DAYS Performed at [facility], [address] [place], [place] [address]    Report Status PENDING  Incomplete  SARS Coronavirus 2 [facility](Hospital order, Performed in [facility] lab)  Status: None   Collection Time: 01/25/19 10:51 AM  Result Value Ref Range Status   SARS Coronavirus 2 NEGATIVE NEGATIVE Final    Comment: (NOTE) If result is NEGATIVE SARS-CoV-2 target nucleic acids are NOT DETECTED. The SARS-CoV-2 RNA is generally detectable in upper and lower  respiratory specimens during the acute phase of infection. The lowest  concentration of SARS-CoV-2 viral copies this assay can detect is 250  copies / mL. A negative result does not preclude SARS-CoV-2 infection  and should not be used as the sole basis for treatment or other  patient management decisions.  A negative result may occur with  improper specimen collection / handling, submission of specimen other  than nasopharyngeal swab, presence of viral mutation(s) within the  areas targeted by this assay, and inadequate number of viral copies  (<250 copies / mL). A negative result must be combined with clinical  observations, patient history, and epidemiological information. If result is POSITIVE SARS-CoV-2 target nucleic acids are DETECTED. The SARS-CoV-2 RNA is generally detectable in upper and lower  respiratory specimens dur ing the acute phase of infection.  Positive  results are indicative of active infection  with SARS-CoV-2.  Clinical  correlation with patient history and other diagnostic information is  necessary to determine patient infection status.  Positive results do  not rule out bacterial infection or co-infection with other viruses. If result is PRESUMPTIVE POSTIVE SARS-CoV-2 nucleic acids MAY BE PRESENT.   A presumptive positive result was obtained on the submitted specimen  and confirmed on repeat testing.  While 2019 novel coronavirus  (SARS-CoV-2) nucleic acids may be present in the submitted sample  additional confirmatory testing may be necessary for epidemiological  and / or clinical management purposes  to differentiate between  SARS-CoV-2 and other Sarbecovirus currently known to infect humans.  If clinically indicated additional testing with an alternate test  methodology [phone]) is advised. The SARS-CoV-2 RNA is generally  detectable in upper and lower respiratory sp ecimens during the acute  phase of infection. The expected result is Negative. Fact Sheet for Patients:  BoilerBrush.com.cy Fact Sheet for Healthcare Providers: https://pope.com/ This test is not yet approved or cleared by the [place] FDA and has been authorized for detection and/or diagnosis of SARS-CoV-2 by FDA under an Emergency Use Authorization (EUA).  This EUA will remain in effect (meaning this test can be used) for the duration of the COVID-19 declaration under Section 564(b)(1) of the Act, 21 U.S.C. section 360bbb-3(b)(1), unless the authorization is terminated or revoked sooner. Performed at [facility], [address] [place], [place] [address]   Respiratory Panel by PCR     Status: None   Collection Time: 01/25/19 12:32 PM  Result Value Ref Range Status   Adenovirus NOT DETECTED NOT DETECTED Final   Coronavirus 229E NOT DETECTED NOT DETECTED Final    Comment: (NOTE) The Coronavirus on the Respiratory Panel, DOES NOT test for the novel   Coronavirus (2019 nCoV)    Coronavirus HKU1 NOT DETECTED NOT DETECTED Final   Coronavirus NL63 NOT DETECTED NOT DETECTED Final   Coronavirus OC43 NOT DETECTED NOT DETECTED Final   Metapneumovirus NOT DETECTED NOT DETECTED Final   Rhinovirus / Enterovirus NOT DETECTED NOT DETECTED Final   Influenza A NOT DETECTED NOT DETECTED Final   Influenza B NOT DETECTED NOT DETECTED Final   Parainfluenza Virus 1 NOT DETECTED NOT DETECTED Final   Parainfluenza Virus 2 NOT DETECTED NOT DETECTED Final   Parainfluenza Virus 3 NOT DETECTED  NOT DETECTED Final   Parainfluenza Virus 4 NOT DETECTED NOT DETECTED Final   Respiratory Syncytial Virus NOT DETECTED NOT DETECTED Final   Bordetella pertussis NOT DETECTED NOT DETECTED Final   Chlamydophila pneumoniae NOT DETECTED NOT DETECTED Final   Mycoplasma pneumoniae NOT DETECTED NOT DETECTED Final    Comment: Performed at [facility], [address], [place], [place] [address]     Scheduled Meds:  arformoterol  15 mcg Nebulization BID   budesonide (PULMICORT) nebulizer solution  0.5 mg Nebulization BID   Chlorhexidine Gluconate Cloth  6 each Topical Daily   doxycycline  100 mg Oral Q12H   fluticasone  2 spray Each Nare Daily   guaiFENesin  600 mg Oral BID   insulin aspart  0-20 Units Subcutaneous TID WC   insulin aspart  10 Units Subcutaneous TID WC   insulin glargine  40 Units Subcutaneous Daily   ipratropium-albuterol  3 mL Nebulization Q6H WA   loratadine  10 mg Oral Daily   methylPREDNISolone (SOLU-MEDROL) injection  60 mg Intravenous Q6H   montelukast  10 mg Oral QHS   oxybutynin  5 mg Oral BID   pantoprazole  40 mg Oral q morning - 10a   rosuvastatin  40 mg Oral QHS   sodium chloride flush  10-40 mL Intracatheter Q12H   Continuous Infusions:  Procedures/Studies: Portable Chest 1 View  Result Date: 01/26/2019 CLINICAL DATA:  63 year old male with respiratory failure. Negative for COVID-19. EXAM: PORTABLE CHEST 1 VIEW  COMPARISON:  01/25/2019 and earlier. FINDINGS: Portable AP upright view at 0523 hours. Underlying large lung volumes. Stable cardiac size and mediastinal contours. The right lung appears clear, with evidence of upper lung emphysema. Right PICC line has been placed and tip is at the level of the lower SVC. Patchy and nodular left upper lung opacity has mildly regressed since yesterday. Stable left lung ventilation elsewhere. No pneumothorax or pleural effusion. IMPRESSION: 1. Right PICC line placed, tip at the lower SVC level. 2. Evidence of emphysema. Left upper lung opacity mildly regressed since yesterday. No new cardiopulmonary abnormality. Electronically Signed   By: Odessa Fleming M.D.   On: 01/26/2019 06:04   Dg Chest Portable 1 View  Result Date: 01/25/2019 CLINICAL DATA:  Acute on chronic respiratory failure. EXAM: PORTABLE CHEST 1 VIEW COMPARISON:  Single-view of the chest 01/16/2019, 01/15/2019 and 10/27/2018. FINDINGS: The lungs are emphysematous. Increased airspace disease is seen in the left upper lobe. There is basilar fibrosis, worse on the left. Heart size is normal. No pneumothorax. IMPRESSION: Increased left upper lobe airspace opacity worrisome for pneumonia superimposed on scar. Emphysema and basilar fibrosis, worse on the left. Electronically Signed   By: Drusilla Kanner M.D.   On: 01/25/2019 07:48   Dg Chest Port 1 View  Result Date: 01/16/2019 CLINICAL DATA:  63 year old male with pneumonia and acute on chronic respiratory failure with hypercapnia EXAM: PORTABLE CHEST 1 VIEW COMPARISON:  Multiple prior chest x-rays, most recently 01/15/2019 FINDINGS: Stable appearance of the chest without significant interval change over the last 24 hours. Persistent interstitial and airspace opacities in the left upper and left lower lobes. No evidence of new airspace opacity, pleural effusion, pneumothorax or pulmonary edema. Stable background hyperinflation, emphysematous changes and chronic bronchitic  changes. IMPRESSION: Stable chest x-ray without significant interval change. Persistent left upper and left lower lobe airspace opacities may represent residual areas of infiltrate/pneumonia versus developing pleuroparenchymal scarring. Electronically Signed   By: Malachy Moan M.D.   On: 01/16/2019  14:58   Dg Chest Port 1 View  Result Date: 01/15/2019 CLINICAL DATA:  Shortness of breath EXAM: PORTABLE CHEST 1 VIEW COMPARISON:  01/14/2019 FINDINGS: Cardiac shadows within normal limits. The right lung is again clear. Left lung again demonstrates persistent infiltrates in the left upper lobe and left base stable from the previous exam. No new focal abnormality is noted. IMPRESSION: Stable left-sided infiltrates when compare with the previous exam. Electronically Signed   By: Alcide Clever M.D.   On: 01/15/2019 03:12   Dg Chest Port 1 View  Result Date: 01/14/2019 CLINICAL DATA:  Dyspnea. EXAM: PORTABLE CHEST 1 VIEW COMPARISON:  01/12/2019 FINDINGS: COPD with hyperinflation. Left upper lobe infiltrate unchanged. Left lower lobe infiltrate with progressive volume loss. No significant pleural effusion. Negative for heart failure. IMPRESSION: Left upper lobe and left lower lobe infiltrates compatible with pneumonia. Progressive volume loss in the left lung base. No effusion. Electronically Signed   By: Marlan Palau M.D.   On: 01/14/2019 13:52   Dg Chest Port 1 View  Result Date: 01/12/2019 CLINICAL DATA:  Respiratory failure EXAM: PORTABLE CHEST 1 VIEW COMPARISON:  01/09/2019 FINDINGS: Heart is normal size. Diffuse airspace disease throughout the left lung, most confluent in the left upper lobe. This is unchanged. No confluent opacity on the right. Heart is normal size. IMPRESSION: Diffuse left lung airspace disease, unchanged since prior study. Electronically Signed   By: Charlett Nose M.D.   On: 01/12/2019 08:08   Dg Chest Portable 1 View  Result Date: 01/09/2019 CLINICAL DATA:  Hypoxia EXAM: PORTABLE  CHEST 1 VIEW COMPARISON:  January 09, 2019 study obtained earlier in the day FINDINGS: Endotracheal tube tip is 4.0 cm above the carina. Nasogastric tube tip is in the stomach. Side port not well seen but probably near the gastroesophageal junction. No pneumothorax evident. : There is airspace consolidation throughout portions of the left upper and left lower lobe. There is mild right base atelectasis. The right lung is otherwise clear. Heart size and pulmonary vascularity are normal. No adenopathy. No bone lesions. IMPRESSION: Tube positions as described without pneumothorax. Extensive airspace opacity felt to represent multifocal pneumonia throughout left upper and lower lobes. Mild right base atelectasis. Heart size normal. Electronically Signed   By: Bretta Bang III M.D.   On: 01/09/2019 18:22   Dg Chest Port 1 View  Result Date: 01/09/2019 CLINICAL DATA:  Decreased level of consciousness and shortness of breath EXAM: PORTABLE CHEST 1 VIEW COMPARISON:  12/09/2018 FINDINGS: Cardiac shadows within normal limits. Lungs are hyperinflated similar to that seen on the prior exam. Patchy infiltrate is noted in the left upper lobe and to a lesser degree in the left lower lobe consistent with multifocal pneumonia. No sizable effusion is seen. No bony abnormality is noted. IMPRESSION: Patchy multifocal pneumonia within the left lung as described. COPD. Electronically Signed   By: Alcide Clever M.D.   On: 01/09/2019 12:46   [place] Ekg Site Rite  Result Date: 01/25/2019 If Site Rite image not attached, placement could not be confirmed due to current cardiac rhythm.   Catarina Hartshorn, DO  [facility] Pager [phone]  If 7PM-7AM, please contact night-coverage [email or web address] Password [email or web address] 01/29/2019, 10:08 AM   LOS: 4 days

## 2019-01-29 NOTE — Progress Notes (Signed)
Patient is asleep resting very well. Will hold BiPAP and neb for now Saturation 96 on 3 lpm.

## 2019-01-29 NOTE — Progress Notes (Signed)
Patient stayed off BiPAP last night. He didn't seem to have any problems as he slept with o2 sats  on 3 lpm. No sleep apnea noted.

## 2019-01-29 NOTE — Clinical Social Work Note (Signed)
Rescheduled appointment with PCP for May 5.

## 2019-01-29 NOTE — Care Management Important Message (Signed)
Important Message  Patient Details  Name: Jeff Ray MRN: [id number] Date of Birth: September 24, 1956   Medicare Important Message Given:  Yes    Corey Harold 01/29/2019, 3:10 PM

## 2019-01-29 NOTE — Plan of Care (Signed)
  Problem: Acute Rehab PT Goals(only PT should resolve) Goal: Patient Will Perform Sitting Balance Outcome: Progressing Flowsheets (Taken 01/29/2019 1238) Patient will perform sitting balance: with supervision Goal: Patient Will Transfer Sit To/From Stand Outcome: Progressing Flowsheets (Taken 01/29/2019 1238) Patient will transfer sit to/from stand: with supervision Goal: Pt Will Ambulate Outcome: Progressing Flowsheets (Taken 01/29/2019 1238) Pt will Ambulate: 25 feet; with rolling walker; with supervision   12:39 PM, 01/29/19 Ocie Bob, MPT Physical Therapist with [facility] [phone] office [phone] mobile phone

## 2019-01-29 NOTE — Progress Notes (Signed)
Subjective: He says he feels okay.  He did not use BiPAP last night but he says he feels like he should have.  He is breathing better.  He is still coughing.  He is still weak.  Objective: Vital signs in last 24 hours: Temp:  [97.6 F (36.4 C)-98.4 F (36.9 C)] 97.7 F (36.5 C) (04/27 0400) Pulse Rate:  [81-104] 95 (04/27 0600) Resp:  [17-31] 30 (04/27 0600) BP: (100-147)/(50-118) 130/72 (04/27 0600) SpO2:  [78 %-100 %] 92 % (04/27 0738) Weight:  [75.1 kg] 75.1 kg (04/27 0400) Weight change: -1.8 kg Last BM Date: 01/26/19  Intake/Output from previous day: 04/26 0701 - 04/27 0700 In: 360 [P.O.:360] Out: 1200 [Urine:1200]  PHYSICAL EXAM General appearance: alert, cooperative and no distress Resp: rhonchi bilaterally Cardio: regular rate and rhythm, S1, S2 normal, no murmur, click, rub or gallop GI: soft, non-tender; bowel sounds normal; no masses,  no organomegaly Extremities: extremities normal, atraumatic, no cyanosis or edema  Lab Results:  Results for orders placed or performed during the hospital encounter of 01/25/19 (from the past 48 hour(s))  Glucose, capillary     Status: Abnormal   Collection Time: 01/27/19 11:27 AM  Result Value Ref Range   Glucose-Capillary 205 (H) 70 - 99 mg/dL  Glucose, capillary     Status: Abnormal   Collection Time: 01/27/19  4:22 PM  Result Value Ref Range   Glucose-Capillary 138 (H) 70 - 99 mg/dL  Glucose, capillary     Status: Abnormal   Collection Time: 01/27/19 10:57 PM  Result Value Ref Range   Glucose-Capillary 173 (H) 70 - 99 mg/dL   Comment 1 Notify RN    Comment 2 Document in Chart   Glucose, capillary     Status: Abnormal   Collection Time: 01/28/19  3:18 AM  Result Value Ref Range   Glucose-Capillary 206 (H) 70 - 99 mg/dL   Comment 1 Notify RN    Comment 2 Document in Chart   Comprehensive metabolic panel     Status: Abnormal   Collection Time: 01/28/19  6:40 AM  Result Value Ref Range   Sodium 133 (L) 135 - 145 mmol/L    Potassium 4.3 3.5 - 5.1 mmol/L   Chloride 98 98 - 111 mmol/L   CO2 30 22 - 32 mmol/L   Glucose, Bld 301 (H) 70 - 99 mg/dL   BUN 24 (H) 8 - 23 mg/dL   Creatinine, Ser 1.61 0.61 - 1.24 mg/dL   Calcium 9.1 8.9 - 09.6 mg/dL   Total Protein 5.7 (L) 6.5 - 8.1 g/dL   Albumin 1.9 (L) 3.5 - 5.0 g/dL   AST 17 15 - 41 U/L   ALT 32 0 - 44 U/L   Alkaline Phosphatase 122 38 - 126 U/L   Total Bilirubin 0.3 0.3 - 1.2 mg/dL   GFR calc non Af Amer >60 >60 mL/min   GFR calc Af Amer >60 >60 mL/min   Anion gap 5 5 - 15    Comment: Performed at [facility], [address], [place], [place] [address]  CBC WITH DIFFERENTIAL     Status: Abnormal   Collection Time: 01/28/19  6:40 AM  Result Value Ref Range   WBC 25.1 (H) 4.0 - 10.5 K/uL    Comment: WHITE COUNT CONFIRMED ON SMEAR   RBC 3.44 (L) 4.22 - 5.81 MIL/uL   Hemoglobin 9.3 (L) 13.0 - 17.0 g/dL   HCT 98.1 (L) 19.1 - 47.8 %   MCV 86.3 80.0 -  100.0 fL   MCH 27.0 26.0 - 34.0 pg   MCHC 31.3 30.0 - 36.0 g/dL   RDW 16.1 09.6 - 04.5 %   Platelets 286 150 - 400 K/uL   nRBC 0.0 0.0 - 0.2 %   Neutrophils Relative % 96 %   Neutro Abs 24.0 (H) 1.7 - 7.7 K/uL   Lymphocytes Relative 1 %   Lymphs Abs 0.4 (L) 0.7 - 4.0 K/uL   Monocytes Relative 2 %   Monocytes Absolute 0.5 0.1 - 1.0 K/uL   Eosinophils Relative 0 %   Eosinophils Absolute 0.0 0.0 - 0.5 K/uL   Basophils Relative 0 %   Basophils Absolute 0.0 0.0 - 0.1 K/uL   WBC Morphology TOXIC GRANULATION     Comment: VACUOLATED NEUTROPHILS   Immature Granulocytes 1 %   Abs Immature Granulocytes 0.22 (H) 0.00 - 0.07 K/uL    Comment: Performed at [facility], [address], [place], [place] [address]  Brain natriuretic peptide     Status: None   Collection Time: 01/28/19  6:40 AM  Result Value Ref Range   B Natriuretic Peptide 83.0 0.0 - 100.0 pg/mL    Comment: Performed at [facility], [address] [place], [place] [address]  Glucose, capillary     Status: Abnormal   Collection Time: 01/28/19   7:30 AM  Result Value Ref Range   Glucose-Capillary 291 (H) 70 - 99 mg/dL  Glucose, capillary     Status: Abnormal   Collection Time: 01/28/19 11:05 AM  Result Value Ref Range   Glucose-Capillary 297 (H) 70 - 99 mg/dL  Glucose, capillary     Status: Abnormal   Collection Time: 01/28/19  4:35 PM  Result Value Ref Range   Glucose-Capillary 148 (H) 70 - 99 mg/dL  Glucose, capillary     Status: None   Collection Time: 01/28/19  9:00 PM  Result Value Ref Range   Glucose-Capillary 86 70 - 99 mg/dL   Comment 1 Notify RN    Comment 2 Document in Chart   Comprehensive metabolic panel     Status: Abnormal   Collection Time: 01/29/19  5:13 AM  Result Value Ref Range   Sodium 137 135 - 145 mmol/L   Potassium 4.0 3.5 - 5.1 mmol/L   Chloride 102 98 - 111 mmol/L   CO2 29 22 - 32 mmol/L   Glucose, Bld 98 70 - 99 mg/dL   BUN 23 8 - 23 mg/dL   Creatinine, Ser 8.29 0.61 - 1.24 mg/dL   Calcium 9.3 8.9 - 56.2 mg/dL   Total Protein 5.5 (L) 6.5 - 8.1 g/dL   Albumin 1.9 (L) 3.5 - 5.0 g/dL   AST 16 15 - 41 U/L   ALT 30 0 - 44 U/L   Alkaline Phosphatase 116 38 - 126 U/L   Total Bilirubin 0.3 0.3 - 1.2 mg/dL   GFR calc non Af Amer >60 >60 mL/min   GFR calc Af Amer >60 >60 mL/min   Anion gap 6 5 - 15    Comment: Performed at [facility], [address], [place], [place] [address]  CBC WITH DIFFERENTIAL     Status: Abnormal   Collection Time: 01/29/19  5:13 AM  Result Value Ref Range   WBC 24.5 (H) 4.0 - 10.5 K/uL   RBC 3.48 (L) 4.22 - 5.81 MIL/uL   Hemoglobin 9.3 (L) 13.0 - 17.0 g/dL   HCT 57.8 (L) 46.9 - 62.9 %   MCV 87.9 80.0 -  100.0 fL   MCH 26.7 26.0 - 34.0 pg   MCHC 30.4 30.0 - 36.0 g/dL   RDW 81.1 91.4 - 78.2 %   Platelets 285 150 - 400 K/uL   nRBC 0.0 0.0 - 0.2 %   Neutrophils Relative % 95 %   Neutro Abs 23.2 (H) 1.7 - 7.7 K/uL   Lymphocytes Relative 2 %   Lymphs Abs 0.5 (L) 0.7 - 4.0 K/uL   Monocytes Relative 2 %   Monocytes Absolute 0.6 0.1 - 1.0 K/uL   Eosinophils Relative 0 %    Eosinophils Absolute 0.0 0.0 - 0.5 K/uL   Basophils Relative 0 %   Basophils Absolute 0.0 0.0 - 0.1 K/uL   Immature Granulocytes 1 %   Abs Immature Granulocytes 0.24 (H) 0.00 - 0.07 K/uL    Comment: Performed at [facility], [address], [place], [place] [address]  Brain natriuretic peptide     Status: Abnormal   Collection Time: 01/29/19  5:13 AM  Result Value Ref Range   B Natriuretic Peptide 130.0 (H) 0.0 - 100.0 pg/mL    Comment: Performed at [facility], [address] [place], [place] [address]    ABGS No results for input(s): PHART, PO2ART, TCO2, HCO3 in the last 72 hours.  Invalid input(s): PCO2 CULTURES Recent Results (from the past 240 hour(s))  Blood Culture (routine x 2)     Status: None (Preliminary result)   Collection Time: 01/25/19  7:58 AM  Result Value Ref Range Status   Specimen Description RIGHT ANTECUBITAL  Final   Special Requests   Final    BOTTLES DRAWN AEROBIC AND ANAEROBIC Blood Culture adequate volume   Culture   Final    NO GROWTH 3 DAYS Performed at [facility], [address], [place], [place] [address]    Report Status PENDING  Incomplete  Blood Culture (routine x 2)     Status: None (Preliminary result)   Collection Time: 01/25/19  9:01 AM  Result Value Ref Range Status   Specimen Description RIGHT ANTECUBITAL  Final   Special Requests   Final    BOTTLES DRAWN AEROBIC AND ANAEROBIC Blood Culture adequate volume   Culture   Final    NO GROWTH 3 DAYS Performed at [facility], [address] [place], [place] [address]    Report Status PENDING  Incomplete  SARS Coronavirus 2 [facility] order, Performed in [facility] hospital lab)     Status: None   Collection Time: 01/25/19 10:51 AM  Result Value Ref Range Status   SARS Coronavirus 2 NEGATIVE NEGATIVE Final    Comment: (NOTE) If result is NEGATIVE SARS-CoV-2 target nucleic acids are NOT DETECTED. The SARS-CoV-2 RNA is generally detectable in upper and lower  respiratory specimens  during the acute phase of infection. The lowest  concentration of SARS-CoV-2 viral copies this assay can detect is 250  copies / mL. A negative result does not preclude SARS-CoV-2 infection  and should not be used as the sole basis for treatment or other  patient management decisions.  A negative result may occur with  improper specimen collection / handling, submission of specimen other  than nasopharyngeal swab, presence of viral mutation(s) within the  areas targeted by this assay, and inadequate number of viral copies  (<250 copies / mL). A negative result must be combined with clinical  observations, patient history, and epidemiological information. If result is POSITIVE SARS-CoV-2 target nucleic acids are DETECTED. The SARS-CoV-2 RNA is generally detectable in  upper and lower  respiratory specimens dur ing the acute phase of infection.  Positive  results are indicative of active infection with SARS-CoV-2.  Clinical  correlation with patient history and other diagnostic information is  necessary to determine patient infection status.  Positive results do  not rule out bacterial infection or co-infection with other viruses. If result is PRESUMPTIVE POSTIVE SARS-CoV-2 nucleic acids MAY BE PRESENT.   A presumptive positive result was obtained on the submitted specimen  and confirmed on repeat testing.  While 2019 novel coronavirus  (SARS-CoV-2) nucleic acids may be present in the submitted sample  additional confirmatory testing may be necessary for epidemiological  and / or clinical management purposes  to differentiate between  SARS-CoV-2 and other Sarbecovirus currently known to infect humans.  If clinically indicated additional testing with an alternate test  methodology [phone]) is advised. The SARS-CoV-2 RNA is generally  detectable in upper and lower respiratory sp ecimens during the acute  phase of infection. The expected result is Negative. Fact Sheet for Patients:   BoilerBrush.com.cy Fact Sheet for Healthcare Providers: https://pope.com/ This test is not yet approved or cleared by the [place] FDA and has been authorized for detection and/or diagnosis of SARS-CoV-2 by FDA under an Emergency Use Authorization (EUA).  This EUA will remain in effect (meaning this test can be used) for the duration of the COVID-19 declaration under Section 564(b)(1) of the Act, 21 U.S.C. section 360bbb-3(b)(1), unless the authorization is terminated or revoked sooner. Performed at [facility], [address] [place], [place] [address]   Respiratory Panel by PCR     Status: None   Collection Time: 01/25/19 12:32 PM  Result Value Ref Range Status   Adenovirus NOT DETECTED NOT DETECTED Final   Coronavirus 229E NOT DETECTED NOT DETECTED Final    Comment: (NOTE) The Coronavirus on the Respiratory Panel, DOES NOT test for the novel  Coronavirus (2019 nCoV)    Coronavirus HKU1 NOT DETECTED NOT DETECTED Final   Coronavirus NL63 NOT DETECTED NOT DETECTED Final   Coronavirus OC43 NOT DETECTED NOT DETECTED Final   Metapneumovirus NOT DETECTED NOT DETECTED Final   Rhinovirus / Enterovirus NOT DETECTED NOT DETECTED Final   Influenza A NOT DETECTED NOT DETECTED Final   Influenza B NOT DETECTED NOT DETECTED Final   Parainfluenza Virus 1 NOT DETECTED NOT DETECTED Final   Parainfluenza Virus 2 NOT DETECTED NOT DETECTED Final   Parainfluenza Virus 3 NOT DETECTED NOT DETECTED Final   Parainfluenza Virus 4 NOT DETECTED NOT DETECTED Final   Respiratory Syncytial Virus NOT DETECTED NOT DETECTED Final   Bordetella pertussis NOT DETECTED NOT DETECTED Final   Chlamydophila pneumoniae NOT DETECTED NOT DETECTED Final   Mycoplasma pneumoniae NOT DETECTED NOT DETECTED Final    Comment: Performed at [facility], [address] [place], [place] [address]   Studies/Results: No results found.  Medications:  Prior to Admission:   Medications Prior to Admission  Medication Sig Dispense Refill Last Dose  . albuterol (PROAIR HFA) 108 (90 BASE) MCG/ACT inhaler Inhale 2 puffs into the lungs every 6 (six) hours as needed for wheezing or shortness of breath. Shortness of Breath   unknown  . ALPRAZolam (XANAX) 1 MG tablet Take 0.5 tablets (0.5 mg total) by mouth 2 (two) times daily as needed for anxiety.  0   . fexofenadine-pseudoephedrine (ALLEGRA-D 24) 180-240 MG 24 hr tablet Take 1 tablet by mouth daily.   01/09/2019 at Unknown time  . fluticasone (FLONASE) 50  MCG/ACT nasal spray Place 2 sprays into both nostrils daily.   01/08/2019 at Unknown time  . glimepiride (AMARYL) 2 MG tablet Take 2 mg by mouth daily with breakfast.   01/08/2019 at Unknown time  . Guaifenesin (MUCINEX MAXIMUM STRENGTH) 1200 MG TB12 Take 1 tablet (1,200 mg total) by mouth 2 (two) times daily. 20 each 0 01/09/2019 at Unknown time  . Insulin Glargine (LANTUS) 100 UNIT/ML Solostar Pen Inject 30 Units into the skin daily. 15 mL 2   . ipratropium-albuterol (DUONEB) 0.5-2.5 (3) MG/3ML SOLN Take 3 mLs by nebulization every 4 (four) hours as needed. For shortness of breath   01/09/2019 at Unknown time  . metFORMIN (GLUCOPHAGE-XR) 500 MG 24 hr tablet Take 500 mg by mouth 2 (two) times daily.    01/09/2019 at Unknown time  . montelukast (SINGULAIR) 10 MG tablet Take 10 mg by mouth every morning.    01/08/2019 at Unknown time  . Multiple Vitamins-Minerals (CENTRUM SILVER 50+MEN PO) Take 1 tablet by mouth daily.   01/09/2019 at Unknown time  . oxybutynin (DITROPAN) 5 MG tablet Take 1 tablet (5 mg total) by mouth 2 (two) times daily.     . pantoprazole (PROTONIX) 40 MG tablet Take 40 mg by mouth every morning.    01/08/2019 at Unknown time  . predniSONE (DELTASONE) 20 MG tablet Take 1-3 tablets (20-60 mg total) by mouth daily with breakfast. Take 60mg  daily for 2days then 40mg  daily for 2 days then 20mg  daily for 2days then STOP 5 tablet 0   . rosuvastatin (CRESTOR) 40 MG tablet Take  40 mg by mouth at bedtime.    01/08/2019 at Unknown time  . TRELEGY ELLIPTA 100-62.5-25 MCG/INH AEPB Take 2 puffs by mouth daily.   01/08/2019 at Unknown time   Scheduled: . budesonide (PULMICORT) nebulizer solution  0.25 mg Nebulization BID  . Chlorhexidine Gluconate Cloth  6 each Topical Daily  . doxycycline  100 mg Oral Q12H  . fluticasone  2 spray Each Nare Daily  . guaiFENesin  600 mg Oral BID  . insulin aspart  0-20 Units Subcutaneous TID WC  . insulin aspart  10 Units Subcutaneous TID WC  . insulin glargine  40 Units Subcutaneous Daily  . ipratropium-albuterol  3 mL Nebulization Q6H WA  . loratadine  10 mg Oral Daily  . methylPREDNISolone (SOLU-MEDROL) injection  40 mg Intravenous Q4H  . montelukast  10 mg Oral QHS  . oxybutynin  5 mg Oral BID  . pantoprazole  40 mg Oral q morning - 10a  . rosuvastatin  40 mg Oral QHS  . sodium chloride flush  10-40 mL Intracatheter Q12H   Continuous:  JYN:[id number]:ALPRAZolam, ondansetron **OR** ondansetron (ZOFRAN) IV, sodium chloride flush, traZODone  Assesment: He was admitted with acute on chronic hypoxic and hypercapnic respiratory failure.  He has just been in the hospital with pneumonia requiring intubation and mechanical ventilation.  He was negative for COVID-19.  He went home and soon after that began having more trouble with his breathing and eventually came back to the hospital.  He was treated with BiPAP and did not require intubation or ventilation.  At baseline he has severe COPD.  He had BiPAP at some point in the past.  I cannot find evidence of a sleep study and I do not think he has sleep apnea.  I think he was on BiPAP because of chronic CO2 retention.  He has healthcare associated pneumonia which is being appropriately treated  He has diabetes on  sliding scale Principal Problem:   Acute and chronic respiratory failure (acute-on-chronic) (HCC) Active Problems:   Acute on chronic respiratory failure with hypercapnia (HCC)   COPD  exacerbation (HCC)   HTN (hypertension)   DM (diabetes mellitus) (HCC)   Tobacco abuse   HLD (hyperlipidemia)   GERD (gastroesophageal reflux disease)   Anxiety   Chronic respiratory failure with hypoxia (HCC)   Uncontrolled type 2 diabetes mellitus with hyperglycemia (HCC)   COPD with acute exacerbation (HCC)   HCAP (healthcare-associated pneumonia)   On home O2   Elevated lactic acid level    Plan: Continue treatments.  I would like him on BiPAP at night.  See if we can get BiPAP machine for home use since he has lost his machine.    LOS: 4 days   Fredirick Maudlin 01/29/2019, 8:07 AM

## 2019-01-30 LAB — GLUCOSE, CAPILLARY
Glucose-Capillary: 122 mg/dL — ABNORMAL HIGH (ref 70–99)
Glucose-Capillary: 128 mg/dL — ABNORMAL HIGH (ref 70–99)
Glucose-Capillary: 158 mg/dL — ABNORMAL HIGH (ref 70–99)
Glucose-Capillary: 90 mg/dL (ref 70–99)

## 2019-01-30 LAB — CULTURE, BLOOD (ROUTINE X 2)
Culture: NO GROWTH
Culture: NO GROWTH
Special Requests: ADEQUATE
Special Requests: ADEQUATE

## 2019-01-30 LAB — CBC WITH DIFFERENTIAL/PLATELET
Abs Immature Granulocytes: 0.24 10*3/uL — ABNORMAL HIGH (ref 0.00–0.07)
Basophils Absolute: 0 10*3/uL (ref 0.0–0.1)
Basophils Relative: 0 %
Eosinophils Absolute: 0 10*3/uL (ref 0.0–0.5)
Eosinophils Relative: 0 %
HCT: 29.3 % — ABNORMAL LOW (ref 39.0–52.0)
Hemoglobin: 8.8 g/dL — ABNORMAL LOW (ref 13.0–17.0)
Immature Granulocytes: 1 %
Lymphocytes Relative: 2 %
Lymphs Abs: 0.4 10*3/uL — ABNORMAL LOW (ref 0.7–4.0)
MCH: 26.6 pg (ref 26.0–34.0)
MCHC: 30 g/dL (ref 30.0–36.0)
MCV: 88.5 fL (ref 80.0–100.0)
Monocytes Absolute: 0.6 10*3/uL (ref 0.1–1.0)
Monocytes Relative: 2 %
Neutro Abs: 24.3 10*3/uL — ABNORMAL HIGH (ref 1.7–7.7)
Neutrophils Relative %: 95 %
Platelets: 249 10*3/uL (ref 150–400)
RBC: 3.31 MIL/uL — ABNORMAL LOW (ref 4.22–5.81)
RDW: 14.6 % (ref 11.5–15.5)
WBC: 25.5 10*3/uL — ABNORMAL HIGH (ref 4.0–10.5)
nRBC: 0 % (ref 0.0–0.2)

## 2019-01-30 LAB — COMPREHENSIVE METABOLIC PANEL
ALT: 33 U/L (ref 0–44)
AST: 17 U/L (ref 15–41)
Albumin: 1.8 g/dL — ABNORMAL LOW (ref 3.5–5.0)
Alkaline Phosphatase: 105 U/L (ref 38–126)
Anion gap: 7 (ref 5–15)
BUN: 24 mg/dL — ABNORMAL HIGH (ref 8–23)
CO2: 28 mmol/L (ref 22–32)
Calcium: 9.2 mg/dL (ref 8.9–10.3)
Chloride: 101 mmol/L (ref 98–111)
Creatinine, Ser: 0.74 mg/dL (ref 0.61–1.24)
GFR calc Af Amer: >60 mL/min (ref >60)
GFR calc non Af Amer: >60 mL/min (ref >60)
Glucose, Bld: 158 mg/dL — ABNORMAL HIGH (ref 70–99)
Potassium: 4.1 mmol/L (ref 3.5–5.1)
Sodium: 136 mmol/L (ref 135–145)
Total Bilirubin: 0.3 mg/dL (ref 0.3–1.2)
Total Protein: 5.5 g/dL — ABNORMAL LOW (ref 6.5–8.1)

## 2019-01-30 MED ORDER — METHYLPREDNISOLONE SODIUM SUCC 125 MG IJ SOLR
60.0000 mg | Freq: Two times a day (BID) | INTRAMUSCULAR | Status: DC
  Administered 2019-01-30 – 2019-01-31 (×3): 60 mg via INTRAVENOUS
  Filled 2019-01-30 (×4): qty 2

## 2019-01-30 NOTE — Progress Notes (Signed)
Physical Therapy Treatment Patient Details Name: Jeff ImusKarl E Mcknight MRN: [id number] DOB: 11/18/1955 Today's Date: 01/30/2019    History of Present Illness  Jeff Ray is a 63 y.o. male smoker with home oxygen dependent COPD was [facility] hospitalization for respiratory failure requiring intubation in the ICU, extubated on 01/10/2019 and discharged 01/12/2019 on a prednisone taper.  He was diagnosed with a pneumonia at that time and was in diabetic ketoacidosis with a hemoglobin A1c near 12%.  His coronavirus testing was negative at the time.  He showed improvement on IV steroids and bronchodilators and was eventually weaned down to 3 L of oxygen and discharged home on a prednisone taper with home health PT OT and RN.    PT Comments    Pt supine in bed and willing to participate.  Pt able to complete all bed mobility and transfer training with min guard/A, just slow and labored.  Pt able to complete all transfer with O2 saturation from 97-99%.  Once sitting pt c/o SOB,  requested to increased O2A, increased to 6.5L for short duration.  Pt educated on diaphragmatic breathing to assist with good saturation levels.     Follow Up Recommendations  Home health PT;Supervision for mobility/OOB;Supervision - Intermittent     Equipment Recommendations  None recommended by PT    Recommendations for Other Services OT consult     Precautions / Restrictions Precautions Precautions: Fall Precaution Comments: Home O2 dependent    Mobility  Bed Mobility Overal bed mobility: Modified Independent Bed Mobility: Supine to Sit     Supine to sit: Supervision;HOB elevated     General bed mobility comments: increased time  Transfers Overall transfer level: Needs assistance Equipment used: None Transfers: Sit to/from UGI CorporationStand;Stand Pivot Transfers Sit to Stand: Min guard Stand pivot transfers: Min guard       General transfer comment: slow labored  movement, increased time  Ambulation/Gait Ambulation/Gait assistance: Min guard;Min assist Gait Distance (Feet): 3 Feet Assistive device: None Gait Pattern/deviations: Decreased step length - right;Decreased step length - left;Decreased stride length Gait velocity: slow   General Gait Details: limited to 3-4 labored steps at bedside mostly due to SOB   Stairs             Wheelchair Mobility    Modified Rankin (Stroke Patients Only)       Balance                                            Cognition Arousal/Alertness: Awake/alert Behavior During Therapy: WFL for tasks assessed/performed Overall Cognitive Status: Within Functional Limits for tasks assessed                                        Exercises      General Comments        Pertinent Vitals/Pain Pain Assessment: No/denies pain    Home Living                      Prior Function            PT Goals (current goals can now be found in the care plan section)      Frequency    Min 3X/week  PT Plan Current plan remains appropriate    Co-evaluation              AM-PAC PT "6 Clicks" Mobility   Outcome Measure  Help needed turning from your back to your side while in a flat bed without using bedrails?: None Help needed moving from lying on your back to sitting on the side of a flat bed without using bedrails?: None Help needed moving to and from a bed to a chair (including a wheelchair)?: A Little Help needed standing up from a chair using your arms (e.g., wheelchair or bedside chair)?: A Little Help needed to walk in hospital room?: A Lot Help needed climbing 3-5 steps with a railing? : A Lot 6 Click Score: 18    End of Session Equipment Utilized During Treatment: Oxygen Activity Tolerance: Patient limited by fatigue(Pt limited by SOB, requested to increased O2 to 6.5L due to SOB, O2 saturatin stayed at 97-99%) Patient left: in  chair;with call bell/phone within reach Nurse Communication: Mobility status PT Visit Diagnosis: Other abnormalities of gait and mobility (R26.89);Muscle weakness (generalized) (M62.81)     Time: 3825-0539 PT Time Calculation (min) (ACUTE ONLY): 15 min  Charges:  $Therapeutic Activity: 8-22 mins                     [address], LPTA; CBIS [phone]   Juel Burrow 01/30/2019, 1:05 PM

## 2019-01-30 NOTE — Progress Notes (Signed)
PROGRESS NOTE  Jeff Ray DJT:[id number] DOB: [birth year] DOA: 01/25/2019 PCP: Gareth Morgan, MD  Brief History:  63 year old male with a history of COPD, anxiety, diabetes mellitus, hypertension, chronic respiratory failure on 2-3 L at home presenting with shortness of breath.  He was noted to have a pulse ox of 84% on 4 L/min. He complained of severe shortness of breath. He was placed on a nonrebreather by EMS. When he arrived in the emergency department he subsequently placed on high flow nasal cannula oxygen and subsequently placed on BiPAP. He had repeat COVID-19 testing done that was again negative. He was started on broad-spectrum antibiotics for presumed nosocomial pneumonia. His lactic acid was elevated at 2.3. He was bolused IV fluids. His WBC was markedly elevated at 30.7 however he had been on a steroid taper. He was noted to have a left upper lobe opacity on chest x-ray and fibrotic changes were seen. His ABG revealed a pH of 7.452, PCO2 48.9, PO2 59.7.  The patient was recently discharge from the hospital after a stay from 01/16/2019 through 01/20/2019 where he was treated for acute on chronic respiratory failure secondary to pneumonia.  He was intubated at that time.  Assessment/Plan: Acute on chronic respiratory failure with hypoxia and hypercapnia -Secondary COPD exacerbation -Patient is normally on 2-3 L at home -Initially presented on BiPAP -Wean oxygen for saturation 88-92%  COPD exacerbation -Increased Pulmicort -Started Brovana -decrease/wean Solu-Medrol -Continue duo nebs -finished 5 days doxy  Diabetes mellitus type 2, uncontrolled with hyperglycemia -01/16/2019 hemoglobin A1c 11.1 -Continue Lantus 40 units daily -NovoLog sliding scale -Continue pre-meal insulin -CBGs controlled  SIRS -Sepsis ruled out -Procalcitonin peaked at 0.30  Hyperlipidemia -Continue statin  Tobacco abuse, in remission -Patient states he has not smoked  in 6 years  Leukocytosis -Likely combination of stress demargination and steroids -he remains afebrile and hemodynamically stable  Anxiety -Continue home dose alprazolam       Disposition Plan:   Home 4/29 or 4/30 if stable Family Communication:   No Family at bedside  Consultants:  pulmonary  Code Status:  FULL   DVT Prophylaxis:   [place] Lovenox   Procedures: As Listed in Progress Note Above  Antibiotics: merrem 4/23>>>4/24 Doxy 4/24>>>4/28      Subjective: Pt states he is breathing better, but still has mild dyspnea on exertion.  Patient denies fevers, chills, headache, chest pain, dyspnea, nausea, vomiting, diarrhea, abdominal pain, dysuria, hematuria, hematochezia, and melena.   Objective: Vitals:   01/30/19 1500 01/30/19 1600 01/30/19 1653 01/30/19 1700  BP: (!) 118/57 122/60  127/69  Pulse: 93 91  98  Resp: 18 (!) 25  20  Temp:   (!) 97.4 F (36.3 C)   TempSrc:   Oral   SpO2: 96% 100%  95%  Weight:      Height:        Intake/Output Summary (Last 24 hours) at 01/30/2019 1813 Last data filed at 01/30/2019 0941 Gross per 24 hour  Intake 250 ml  Output 225 ml  Net 25 ml   Weight change: 0.3 kg Exam:   General:  Pt is alert, follows commands appropriately, not in acute distress  HEENT: No icterus, No thrush, No neck mass, [place]/AT  Cardiovascular: RRR, S1/S2, no rubs, no gallops  Respiratory:bibasilar rales, no wheeze  Abdomen: Soft/+BS, non tender, non distended, no guarding  Extremities: No edema, No lymphangitis, No petechiae, No rashes, no synovitis   Data Reviewed: I  have personally reviewed following labs and imaging studies Basic Metabolic Panel: Recent Labs  Lab 01/26/19 0542 01/27/19 0412 01/28/19 0640 01/29/19 0513 01/30/19 0410  NA 133* 133* 133* 137 136  K 4.4 4.4 4.3 4.0 4.1  CL 98 98 98 102 101  CO2 GLUCOSE 252* 243* 301* 98 158*  BUN 16 18 24* 23 24*  CREATININE 0.73 0.79 0.90 0.79 0.74   CALCIUM 8.9 9.2 9.1 9.3 9.2  MG 2.1 2.1  --   --   --    Liver Function Tests: Recent Labs  Lab 01/26/19 0542 01/27/19 0412 01/28/19 0640 01/29/19 0513 01/30/19 0410  AST 12* ALT 30 26 32 30 33  ALKPHOS 133* 121 122 116 105  BILITOT 0.5 0.1* 0.3 0.3 0.3  PROT 5.7* 5.3* 5.7* 5.5* 5.5*  ALBUMIN 1.9* 1.7* 1.9* 1.9* 1.8*   No results for input(s): LIPASE, AMYLASE in the last 168 hours. No results for input(s): AMMONIA in the last 168 hours. Coagulation Profile: No results for input(s): INR, PROTIME in the last 168 hours. CBC: Recent Labs  Lab 01/26/19 0542 01/27/19 0412 01/28/19 0640 01/29/19 0513 01/30/19 0410  WBC 32.2* 30.6* 25.1* 24.5* 25.5*  NEUTROABS 31.1* 29.4* 24.0* 23.2* 24.3*  HGB 10.6* 9.5* 9.3* 9.3* 8.8*  HCT 35.3* 31.0* 29.7* 30.6* 29.3*  MCV 88.5 87.6 86.3 87.9 88.5  PLT 233 260 286 285 249   Cardiac Enzymes: No results for input(s): CKTOTAL, CKMB, CKMBINDEX, TROPONINI in the last 168 hours. BNP: Invalid input(s): POCBNP CBG: Recent Labs  Lab 01/29/19 2122 01/30/19 0332 01/30/19 0742 01/30/19 1115 01/30/19 1658  GLUCAP 121* 122* 128* 158* 90   HbA1C: No results for input(s): HGBA1C in the last 72 hours. Urine analysis:    Component Value Date/Time   COLORURINE YELLOW 01/25/2019 0743   APPEARANCEUR HAZY (A) 01/25/2019 0743   LABSPEC 1.027 01/25/2019 0743   PHURINE 7.0 01/25/2019 0743   GLUCOSEU 150 (A) 01/25/2019 0743   HGBUR NEGATIVE 01/25/2019 0743   BILIRUBINUR NEGATIVE 01/25/2019 0743   KETONESUR NEGATIVE 01/25/2019 0743   PROTEINUR 30 (A) 01/25/2019 0743   UROBILINOGEN 0.2 02/08/2008 1731   NITRITE NEGATIVE 01/25/2019 0743   LEUKOCYTESUR NEGATIVE 01/25/2019 0743   Sepsis Labs: (procalcitonin:4,lacticidven:4) ) Recent Results (from the past 240 hour(s))  Blood Culture (routine x 2)     Status: None   Collection Time: 01/25/19  7:58 AM  Result Value Ref Range Status   Specimen Description RIGHT  ANTECUBITAL  Final   Special Requests   Final    BOTTLES DRAWN AEROBIC AND ANAEROBIC Blood Culture adequate volume   Culture   Final    NO GROWTH 5 DAYS Performed at [facility], [address], [place], [place] [address]    Report Status 01/30/2019 FINAL  Final  Blood Culture (routine x 2)     Status: None   Collection Time: 01/25/19  9:01 AM  Result Value Ref Range Status   Specimen Description RIGHT ANTECUBITAL  Final   Special Requests   Final    BOTTLES DRAWN AEROBIC AND ANAEROBIC Blood Culture adequate volume   Culture   Final    NO GROWTH 5 DAYS Performed at [facility], [address] [place], [place] [address]    Report Status 01/30/2019 FINAL  Final  SARS Coronavirus 2 [facility] order, Performed in [facility] hospital lab)     Status: None   Collection Time: 01/25/19 10:51 AM  Result Value Ref Range Status   SARS Coronavirus 2 NEGATIVE NEGATIVE Final    Comment: (NOTE) If result is NEGATIVE SARS-CoV-2 target nucleic acids are NOT DETECTED. The SARS-CoV-2 RNA is generally detectable in upper and lower  respiratory specimens during the acute phase of infection. The lowest  concentration of SARS-CoV-2 viral copies this assay can detect is 250  copies / mL. A negative result does not preclude SARS-CoV-2 infection  and should not be used as the sole basis for treatment or other  patient management decisions.  A negative result may occur with  improper specimen collection / handling, submission of specimen other  than nasopharyngeal swab, presence of viral mutation(s) within the  areas targeted by this assay, and inadequate number of viral copies  (<250 copies / mL). A negative result must be combined with clinical  observations, patient history, and epidemiological information. If result is POSITIVE SARS-CoV-2 target nucleic acids are DETECTED. The SARS-CoV-2 RNA is generally detectable in upper and lower  respiratory specimens dur ing the acute phase of infection.   Positive  results are indicative of active infection with SARS-CoV-2.  Clinical  correlation with patient history and other diagnostic information is  necessary to determine patient infection status.  Positive results do  not rule out bacterial infection or co-infection with other viruses. If result is PRESUMPTIVE POSTIVE SARS-CoV-2 nucleic acids MAY BE PRESENT.   A presumptive positive result was obtained on the submitted specimen  and confirmed on repeat testing.  While 2019 novel coronavirus  (SARS-CoV-2) nucleic acids may be present in the submitted sample  additional confirmatory testing may be necessary for epidemiological  and / or clinical management purposes  to differentiate between  SARS-CoV-2 and other Sarbecovirus currently known to infect humans.  If clinically indicated additional testing with an alternate test  methodology [phone]([id number]) is advised. The SARS-CoV-2 RNA is generally  detectable in upper and lower respiratory sp ecimens during the acute  phase of infection. The expected result is Negative. Fact Sheet for Patients:  BoilerBrush.com.cyhttps://www.fda.gov/media/136312/download Fact Sheet for Healthcare Providers: https://pope.com/https://www.fda.gov/media/136313/download This test is not yet approved or cleared by the [place] FDA and has been authorized for detection and/or diagnosis of SARS-CoV-2 by FDA under an Emergency Use Authorization (EUA).  This EUA will remain in effect (meaning this test can be used) for the duration of the COVID-19 declaration under Section 564(b)(1) of the Act, 21 U.S.C. section 360bbb-3(b)(1), unless the authorization is terminated or revoked sooner. Performed at [facility], [address] [place], [place] [address]   Respiratory Panel by PCR     Status: None   Collection Time: 01/25/19 12:32 PM  Result Value Ref Range Status   Adenovirus NOT DETECTED NOT DETECTED Final   Coronavirus 229E NOT DETECTED NOT DETECTED Final    Comment: (NOTE) The Coronavirus  on the Respiratory Panel, DOES NOT test for the novel  Coronavirus (2019 nCoV)    Coronavirus HKU1 NOT DETECTED NOT DETECTED Final   Coronavirus NL63 NOT DETECTED NOT DETECTED Final   Coronavirus OC43 NOT DETECTED NOT DETECTED Final   Metapneumovirus NOT DETECTED NOT DETECTED Final   Rhinovirus / Enterovirus NOT DETECTED NOT DETECTED Final   Influenza A NOT DETECTED NOT DETECTED Final   Influenza B NOT DETECTED NOT DETECTED Final   Parainfluenza Virus 1 NOT DETECTED NOT DETECTED Final   Parainfluenza Virus 2 NOT DETECTED NOT DETECTED Final   Parainfluenza Virus 3 NOT DETECTED NOT DETECTED Final   Parainfluenza Virus 4 NOT DETECTED  NOT DETECTED Final   Respiratory Syncytial Virus NOT DETECTED NOT DETECTED Final   Bordetella pertussis NOT DETECTED NOT DETECTED Final   Chlamydophila pneumoniae NOT DETECTED NOT DETECTED Final   Mycoplasma pneumoniae NOT DETECTED NOT DETECTED Final    Comment: Performed at [facility], [address] [place], [place] [address]     Scheduled Meds:  arformoterol  15 mcg Nebulization BID   budesonide (PULMICORT) nebulizer solution  0.5 mg Nebulization BID   Chlorhexidine Gluconate Cloth  6 each Topical Daily   doxycycline  100 mg Oral Q12H   fluticasone  2 spray Each Nare Daily   guaiFENesin  600 mg Oral BID   insulin aspart  0-20 Units Subcutaneous TID WC   insulin aspart  10 Units Subcutaneous TID WC   insulin glargine  40 Units Subcutaneous Daily   ipratropium-albuterol  3 mL Nebulization Q6H WA   loratadine  10 mg Oral Daily   [START ON 01/31/2019] methylPREDNISolone (SOLU-MEDROL) injection  60 mg Intravenous Q12H   montelukast  10 mg Oral QHS   oxybutynin  5 mg Oral BID   pantoprazole  40 mg Oral q morning - 10a   rosuvastatin  40 mg Oral QHS   sodium chloride flush  10-40 mL Intracatheter Q12H   Continuous Infusions:  Procedures/Studies: Portable Chest 1 View  Result Date: 01/26/2019 CLINICAL DATA:  63 year old male  with respiratory failure. Negative for COVID-19. EXAM: PORTABLE CHEST 1 VIEW COMPARISON:  01/25/2019 and earlier. FINDINGS: Portable AP upright view at 0523 hours. Underlying large lung volumes. Stable cardiac size and mediastinal contours. The right lung appears clear, with evidence of upper lung emphysema. Right PICC line has been placed and tip is at the level of the lower SVC. Patchy and nodular left upper lung opacity has mildly regressed since yesterday. Stable left lung ventilation elsewhere. No pneumothorax or pleural effusion. IMPRESSION: 1. Right PICC line placed, tip at the lower SVC level. 2. Evidence of emphysema. Left upper lung opacity mildly regressed since yesterday. No new cardiopulmonary abnormality. Electronically Signed   By: Odessa Fleming M.D.   On: 01/26/2019 06:04   Dg Chest Portable 1 View  Result Date: 01/25/2019 CLINICAL DATA:  Acute on chronic respiratory failure. EXAM: PORTABLE CHEST 1 VIEW COMPARISON:  Single-view of the chest 01/16/2019, 01/15/2019 and 10/27/2018. FINDINGS: The lungs are emphysematous. Increased airspace disease is seen in the left upper lobe. There is basilar fibrosis, worse on the left. Heart size is normal. No pneumothorax. IMPRESSION: Increased left upper lobe airspace opacity worrisome for pneumonia superimposed on scar. Emphysema and basilar fibrosis, worse on the left. Electronically Signed   By: Drusilla Kanner M.D.   On: 01/25/2019 07:48   Dg Chest Port 1 View  Result Date: 01/16/2019 CLINICAL DATA:  63 year old male with pneumonia and acute on chronic respiratory failure with hypercapnia EXAM: PORTABLE CHEST 1 VIEW COMPARISON:  Multiple prior chest x-rays, most recently 01/15/2019 FINDINGS: Stable appearance of the chest without significant interval change over the last 24 hours. Persistent interstitial and airspace opacities in the left upper and left lower lobes. No evidence of new airspace opacity, pleural effusion, pneumothorax or pulmonary edema.  Stable background hyperinflation, emphysematous changes and chronic bronchitic changes. IMPRESSION: Stable chest x-ray without significant interval change. Persistent left upper and left lower lobe airspace opacities may represent residual areas of infiltrate/pneumonia versus developing pleuroparenchymal scarring. Electronically Signed   By: Malachy Moan M.D.   On: 01/16/2019 14:58   Dg Chest Port 1  View  Result Date: 01/15/2019 CLINICAL DATA:  Shortness of breath EXAM: PORTABLE CHEST 1 VIEW COMPARISON:  01/14/2019 FINDINGS: Cardiac shadows within normal limits. The right lung is again clear. Left lung again demonstrates persistent infiltrates in the left upper lobe and left base stable from the previous exam. No new focal abnormality is noted. IMPRESSION: Stable left-sided infiltrates when compare with the previous exam. Electronically Signed   By: Alcide Clever M.D.   On: 01/15/2019 03:12   Dg Chest Port 1 View  Result Date: 01/14/2019 CLINICAL DATA:  Dyspnea. EXAM: PORTABLE CHEST 1 VIEW COMPARISON:  01/12/2019 FINDINGS: COPD with hyperinflation. Left upper lobe infiltrate unchanged. Left lower lobe infiltrate with progressive volume loss. No significant pleural effusion. Negative for heart failure. IMPRESSION: Left upper lobe and left lower lobe infiltrates compatible with pneumonia. Progressive volume loss in the left lung base. No effusion. Electronically Signed   By: Marlan Palau M.D.   On: 01/14/2019 13:52   Dg Chest Port 1 View  Result Date: 01/12/2019 CLINICAL DATA:  Respiratory failure EXAM: PORTABLE CHEST 1 VIEW COMPARISON:  01/09/2019 FINDINGS: Heart is normal size. Diffuse airspace disease throughout the left lung, most confluent in the left upper lobe. This is unchanged. No confluent opacity on the right. Heart is normal size. IMPRESSION: Diffuse left lung airspace disease, unchanged since prior study. Electronically Signed   By: Charlett Nose M.D.   On: 01/12/2019 08:08   Dg Chest  Portable 1 View  Result Date: 01/09/2019 CLINICAL DATA:  Hypoxia EXAM: PORTABLE CHEST 1 VIEW COMPARISON:  January 09, 2019 study obtained earlier in the day FINDINGS: Endotracheal tube tip is 4.0 cm above the carina. Nasogastric tube tip is in the stomach. Side port not well seen but probably near the gastroesophageal junction. No pneumothorax evident. : There is airspace consolidation throughout portions of the left upper and left lower lobe. There is mild right base atelectasis. The right lung is otherwise clear. Heart size and pulmonary vascularity are normal. No adenopathy. No bone lesions. IMPRESSION: Tube positions as described without pneumothorax. Extensive airspace opacity felt to represent multifocal pneumonia throughout left upper and lower lobes. Mild right base atelectasis. Heart size normal. Electronically Signed   By: Bretta Bang III M.D.   On: 01/09/2019 18:22   Dg Chest Port 1 View  Result Date: 01/09/2019 CLINICAL DATA:  Decreased level of consciousness and shortness of breath EXAM: PORTABLE CHEST 1 VIEW COMPARISON:  12/09/2018 FINDINGS: Cardiac shadows within normal limits. Lungs are hyperinflated similar to that seen on the prior exam. Patchy infiltrate is noted in the left upper lobe and to a lesser degree in the left lower lobe consistent with multifocal pneumonia. No sizable effusion is seen. No bony abnormality is noted. IMPRESSION: Patchy multifocal pneumonia within the left lung as described. COPD. Electronically Signed   By: Alcide Clever M.D.   On: 01/09/2019 12:46   [place] Ekg Site Rite  Result Date: 01/25/2019 If Site Rite image not attached, placement could not be confirmed due to current cardiac rhythm.   Catarina Hartshorn, DO  [facility] Pager [phone]  If 7PM-7AM, please contact night-coverage [email or web address] Password [facility] 01/30/2019, 6:13 PM   LOS: 5 days

## 2019-01-30 NOTE — Progress Notes (Signed)
Subjective: He says he feels better.  He is still short of breath.  He is approaching baseline.  He did use BiPAP last night.  No new complaints.  Objective: Vital signs in last 24 hours: Temp:  [97.5 F (36.4 C)-98.2 F (36.8 C)] 97.5 F (36.4 C) (04/28 0738) Pulse Rate:  [81-103] 83 (04/28 0500) Resp:  [12-28] 27 (04/28 0500) BP: (107-144)/(60-82) 107/62 (04/28 0500) SpO2:  [93 %-100 %] 95 % (04/28 0735) Weight:  [75.4 kg] 75.4 kg (04/28 0500) Weight change: 0.3 kg Last BM Date: 01/26/19  Intake/Output from previous day: 04/27 0701 - 04/28 0700 In: 250 [P.O.:240; I.V.:10] Out: 875 [Urine:875]  PHYSICAL EXAM General appearance: alert, cooperative and no distress Resp: rhonchi bilaterally Cardio: regular rate and rhythm, S1, S2 normal, no murmur, click, rub or gallop GI: soft, non-tender; bowel sounds normal; no masses,  no organomegaly Extremities: extremities normal, atraumatic, no cyanosis or edema  Lab Results:  Results for orders placed or performed during the hospital encounter of 01/25/19 (from the past 48 hour(s))  Glucose, capillary     Status: Abnormal   Collection Time: 01/28/19 11:05 AM  Result Value Ref Range   Glucose-Capillary 297 (H) 70 - 99 mg/dL  Glucose, capillary     Status: Abnormal   Collection Time: 01/28/19  4:35 PM  Result Value Ref Range   Glucose-Capillary 148 (H) 70 - 99 mg/dL  Glucose, capillary     Status: None   Collection Time: 01/28/19  9:00 PM  Result Value Ref Range   Glucose-Capillary 86 70 - 99 mg/dL   Comment 1 Notify RN    Comment 2 Document in Chart   Comprehensive metabolic panel     Status: Abnormal   Collection Time: 01/29/19  5:13 AM  Result Value Ref Range   Sodium 137 135 - 145 mmol/L   Potassium 4.0 3.5 - 5.1 mmol/L   Chloride 102 98 - 111 mmol/L   CO2 29 22 - 32 mmol/L   Glucose, Bld 98 70 - 99 mg/dL   BUN 23 8 - 23 mg/dL   Creatinine, Ser 1.61 0.61 - 1.24 mg/dL   Calcium 9.3 8.9 - 09.6 mg/dL   Total Protein 5.5  (L) 6.5 - 8.1 g/dL   Albumin 1.9 (L) 3.5 - 5.0 g/dL   AST 16 15 - 41 U/L   ALT 30 0 - 44 U/L   Alkaline Phosphatase 116 38 - 126 U/L   Total Bilirubin 0.3 0.3 - 1.2 mg/dL   GFR calc non Af Amer >60 >60 mL/min   GFR calc Af Amer >60 >60 mL/min   Anion gap 6 5 - 15    Comment: Performed at [facility], [address] [place], [place] [address]  CBC WITH DIFFERENTIAL     Status: Abnormal   Collection Time: 01/29/19  5:13 AM  Result Value Ref Range   WBC 24.5 (H) 4.0 - 10.5 K/uL   RBC 3.48 (L) 4.22 - 5.81 MIL/uL   Hemoglobin 9.3 (L) 13.0 - 17.0 g/dL   HCT 98.1 (L) 19.1 - 47.8 %   MCV 87.9 80.0 - 100.0 fL   MCH 26.7 26.0 - 34.0 pg   MCHC 30.4 30.0 - 36.0 g/dL   RDW 29.5 62.1 - 30.8 %   Platelets 285 150 - 400 K/uL   nRBC 0.0 0.0 - 0.2 %   Neutrophils Relative % 95 %   Neutro Abs 23.2 (H) 1.7 - 7.7 K/uL   Lymphocytes Relative 2 %  Lymphs Abs 0.5 (L) 0.7 - 4.0 K/uL   Monocytes Relative 2 %   Monocytes Absolute 0.6 0.1 - 1.0 K/uL   Eosinophils Relative 0 %   Eosinophils Absolute 0.0 0.0 - 0.5 K/uL   Basophils Relative 0 %   Basophils Absolute 0.0 0.0 - 0.1 K/uL   Immature Granulocytes 1 %   Abs Immature Granulocytes 0.24 (H) 0.00 - 0.07 K/uL    Comment: Performed at [facility], [address] [place], [place] [address]  Brain natriuretic peptide     Status: Abnormal   Collection Time: 01/29/19  5:13 AM  Result Value Ref Range   B Natriuretic Peptide 130.0 (H) 0.0 - 100.0 pg/mL    Comment: Performed at [facility], [address], [place], [place] [address]  Glucose, capillary     Status: Abnormal   Collection Time: 01/29/19  8:10 AM  Result Value Ref Range   Glucose-Capillary 110 (H) 70 - 99 mg/dL  Glucose, capillary     Status: Abnormal   Collection Time: 01/29/19 11:31 AM  Result Value Ref Range   Glucose-Capillary 155 (H) 70 - 99 mg/dL  Glucose, capillary     Status: Abnormal   Collection Time: 01/29/19  4:49 PM  Result Value Ref Range   Glucose-Capillary 158 (H) 70  - 99 mg/dL  Glucose, capillary     Status: Abnormal   Collection Time: 01/29/19  9:22 PM  Result Value Ref Range   Glucose-Capillary 121 (H) 70 - 99 mg/dL  Glucose, capillary     Status: Abnormal   Collection Time: 01/30/19  3:32 AM  Result Value Ref Range   Glucose-Capillary 122 (H) 70 - 99 mg/dL  Comprehensive metabolic panel     Status: Abnormal   Collection Time: 01/30/19  4:10 AM  Result Value Ref Range   Sodium 136 135 - 145 mmol/L   Potassium 4.1 3.5 - 5.1 mmol/L   Chloride 101 98 - 111 mmol/L   CO2 28 22 - 32 mmol/L   Glucose, Bld 158 (H) 70 - 99 mg/dL   BUN 24 (H) 8 - 23 mg/dL   Creatinine, Ser 6.57 0.61 - 1.24 mg/dL   Calcium 9.2 8.9 - 84.6 mg/dL   Total Protein 5.5 (L) 6.5 - 8.1 g/dL   Albumin 1.8 (L) 3.5 - 5.0 g/dL   AST 17 15 - 41 U/L   ALT 33 0 - 44 U/L   Alkaline Phosphatase 105 38 - 126 U/L   Total Bilirubin 0.3 0.3 - 1.2 mg/dL   GFR calc non Af Amer >60 >60 mL/min   GFR calc Af Amer >60 >60 mL/min   Anion gap 7 5 - 15    Comment: Performed at [facility], [address] [place], [place] [address]  CBC WITH DIFFERENTIAL     Status: Abnormal   Collection Time: 01/30/19  4:10 AM  Result Value Ref Range   WBC 25.5 (H) 4.0 - 10.5 K/uL    Comment: WHITE COUNT CONFIRMED ON SMEAR   RBC 3.31 (L) 4.22 - 5.81 MIL/uL   Hemoglobin 8.8 (L) 13.0 - 17.0 g/dL   HCT 28.4 (L) 13.2 - 44.0 %   MCV 88.5 80.0 - 100.0 fL   MCH 26.6 26.0 - 34.0 pg   MCHC 30.0 30.0 - 36.0 g/dL   RDW 10.2 72.5 - 36.6 %   Platelets 249 150 - 400 K/uL   nRBC 0.0 0.0 - 0.2 %   Neutrophils Relative % 95 %  Neutro Abs 24.3 (H) 1.7 - 7.7 K/uL   Lymphocytes Relative 2 %   Lymphs Abs 0.4 (L) 0.7 - 4.0 K/uL   Monocytes Relative 2 %   Monocytes Absolute 0.6 0.1 - 1.0 K/uL   Eosinophils Relative 0 %   Eosinophils Absolute 0.0 0.0 - 0.5 K/uL   Basophils Relative 0 %   Basophils Absolute 0.0 0.0 - 0.1 K/uL   Immature Granulocytes 1 %   Abs Immature Granulocytes 0.24 (H) 0.00 - 0.07 K/uL    Comment:  Performed at [facility], [address] [place], [place] [address]  Glucose, capillary     Status: Abnormal   Collection Time: 01/30/19  7:42 AM  Result Value Ref Range   Glucose-Capillary 128 (H) 70 - 99 mg/dL    ABGS No results for input(s): PHART, PO2ART, TCO2, HCO3 in the last 72 hours.  Invalid input(s): PCO2 CULTURES Recent Results (from the past 240 hour(s))  Blood Culture (routine x 2)     Status: None (Preliminary result)   Collection Time: 01/25/19  7:58 AM  Result Value Ref Range Status   Specimen Description RIGHT ANTECUBITAL  Final   Special Requests   Final    BOTTLES DRAWN AEROBIC AND ANAEROBIC Blood Culture adequate volume   Culture   Final    NO GROWTH 4 DAYS Performed at [facility], [address], [place], [place] [address]    Report Status PENDING  Incomplete  Blood Culture (routine x 2)     Status: None (Preliminary result)   Collection Time: 01/25/19  9:01 AM  Result Value Ref Range Status   Specimen Description RIGHT ANTECUBITAL  Final   Special Requests   Final    BOTTLES DRAWN AEROBIC AND ANAEROBIC Blood Culture adequate volume   Culture   Final    NO GROWTH 4 DAYS Performed at [facility], [address], [place], [place] [address]    Report Status PENDING  Incomplete  SARS Coronavirus 2 [facility] order, Performed in [facility] hospital lab)     Status: None   Collection Time: 01/25/19 10:51 AM  Result Value Ref Range Status   SARS Coronavirus 2 NEGATIVE NEGATIVE Final    Comment: (NOTE) If result is NEGATIVE SARS-CoV-2 target nucleic acids are NOT DETECTED. The SARS-CoV-2 RNA is generally detectable in upper and lower  respiratory specimens during the acute phase of infection. The lowest  concentration of SARS-CoV-2 viral copies this assay can detect is 250  copies / mL. A negative result does not preclude SARS-CoV-2 infection  and should not be used as the sole basis for treatment or other  patient management decisions.  A negative result  may occur with  improper specimen collection / handling, submission of specimen other  than nasopharyngeal swab, presence of viral mutation(s) within the  areas targeted by this assay, and inadequate number of viral copies  (<250 copies / mL). A negative result must be combined with clinical  observations, patient history, and epidemiological information. If result is POSITIVE SARS-CoV-2 target nucleic acids are DETECTED. The SARS-CoV-2 RNA is generally detectable in upper and lower  respiratory specimens dur ing the acute phase of infection.  Positive  results are indicative of active infection with SARS-CoV-2.  Clinical  correlation with patient history and other diagnostic information is  necessary to determine patient infection status.  Positive results do  not rule out bacterial infection or co-infection with other viruses. If result is PRESUMPTIVE POSTIVE SARS-CoV-2 nucleic acids MAY BE PRESENT.  A presumptive positive result was obtained on the submitted specimen  and confirmed on repeat testing.  While 2019 novel coronavirus  (SARS-CoV-2) nucleic acids may be present in the submitted sample  additional confirmatory testing may be necessary for epidemiological  and / or clinical management purposes  to differentiate between  SARS-CoV-2 and other Sarbecovirus currently known to infect humans.  If clinically indicated additional testing with an alternate test  methodology [phone]) is advised. The SARS-CoV-2 RNA is generally  detectable in upper and lower respiratory sp ecimens during the acute  phase of infection. The expected result is Negative. Fact Sheet for Patients:  BoilerBrush.com.cy Fact Sheet for Healthcare Providers: https://pope.com/ This test is not yet approved or cleared by the [place] FDA and has been authorized for detection and/or diagnosis of SARS-CoV-2 by FDA under an Emergency Use Authorization (EUA).   This EUA will remain in effect (meaning this test can be used) for the duration of the COVID-19 declaration under Section 564(b)(1) of the Act, 21 U.S.C. section 360bbb-3(b)(1), unless the authorization is terminated or revoked sooner. Performed at [facility], [address] [place], [place] [address]   Respiratory Panel by PCR     Status: None   Collection Time: 01/25/19 12:32 PM  Result Value Ref Range Status   Adenovirus NOT DETECTED NOT DETECTED Final   Coronavirus 229E NOT DETECTED NOT DETECTED Final    Comment: (NOTE) The Coronavirus on the Respiratory Panel, DOES NOT test for the novel  Coronavirus (2019 nCoV)    Coronavirus HKU1 NOT DETECTED NOT DETECTED Final   Coronavirus NL63 NOT DETECTED NOT DETECTED Final   Coronavirus OC43 NOT DETECTED NOT DETECTED Final   Metapneumovirus NOT DETECTED NOT DETECTED Final   Rhinovirus / Enterovirus NOT DETECTED NOT DETECTED Final   Influenza A NOT DETECTED NOT DETECTED Final   Influenza B NOT DETECTED NOT DETECTED Final   Parainfluenza Virus 1 NOT DETECTED NOT DETECTED Final   Parainfluenza Virus 2 NOT DETECTED NOT DETECTED Final   Parainfluenza Virus 3 NOT DETECTED NOT DETECTED Final   Parainfluenza Virus 4 NOT DETECTED NOT DETECTED Final   Respiratory Syncytial Virus NOT DETECTED NOT DETECTED Final   Bordetella pertussis NOT DETECTED NOT DETECTED Final   Chlamydophila pneumoniae NOT DETECTED NOT DETECTED Final   Mycoplasma pneumoniae NOT DETECTED NOT DETECTED Final    Comment: Performed at [facility], [address], [place], [place] [address]   Studies/Results: No results found.  Medications:  Prior to Admission:  Medications Prior to Admission  Medication Sig Dispense Refill Last Dose  . albuterol (PROAIR HFA) 108 (90 BASE) MCG/ACT inhaler Inhale 2 puffs into the lungs every 6 (six) hours as needed for wheezing or shortness of breath. Shortness of Breath   unknown  . ALPRAZolam (XANAX) 1 MG tablet Take 0.5 tablets  (0.5 mg total) by mouth 2 (two) times daily as needed for anxiety.  0   . fexofenadine-pseudoephedrine (ALLEGRA-D 24) 180-240 MG 24 hr tablet Take 1 tablet by mouth daily.   01/09/2019 at Unknown time  . fluticasone (FLONASE) 50 MCG/ACT nasal spray Place 2 sprays into both nostrils daily.   01/08/2019 at Unknown time  . glimepiride (AMARYL) 2 MG tablet Take 2 mg by mouth daily with breakfast.   01/08/2019 at Unknown time  . Guaifenesin (MUCINEX MAXIMUM STRENGTH) 1200 MG TB12 Take 1 tablet (1,200 mg total) by mouth 2 (two) times daily. 20 each 0 01/09/2019 at Unknown time  . Insulin Glargine (LANTUS) 100  UNIT/ML Solostar Pen Inject 30 Units into the skin daily. 15 mL 2   . ipratropium-albuterol (DUONEB) 0.5-2.5 (3) MG/3ML SOLN Take 3 mLs by nebulization every 4 (four) hours as needed. For shortness of breath   01/09/2019 at Unknown time  . metFORMIN (GLUCOPHAGE-XR) 500 MG 24 hr tablet Take 500 mg by mouth 2 (two) times daily.    01/09/2019 at Unknown time  . montelukast (SINGULAIR) 10 MG tablet Take 10 mg by mouth every morning.    01/08/2019 at Unknown time  . Multiple Vitamins-Minerals (CENTRUM SILVER 50+MEN PO) Take 1 tablet by mouth daily.   01/09/2019 at Unknown time  . oxybutynin (DITROPAN) 5 MG tablet Take 1 tablet (5 mg total) by mouth 2 (two) times daily.     . pantoprazole (PROTONIX) 40 MG tablet Take 40 mg by mouth every morning.    01/08/2019 at Unknown time  . predniSONE (DELTASONE) 20 MG tablet Take 1-3 tablets (20-60 mg total) by mouth daily with breakfast. Take 60mg  daily for 2days then 40mg  daily for 2 days then 20mg  daily for 2days then STOP 5 tablet 0   . rosuvastatin (CRESTOR) 40 MG tablet Take 40 mg by mouth at bedtime.    01/08/2019 at Unknown time  . TRELEGY ELLIPTA 100-62.5-25 MCG/INH AEPB Take 2 puffs by mouth daily.   01/08/2019 at Unknown time   Scheduled: . arformoterol  15 mcg Nebulization BID  . budesonide (PULMICORT) nebulizer solution  0.5 mg Nebulization BID  . Chlorhexidine Gluconate  Cloth  6 each Topical Daily  . doxycycline  100 mg Oral Q12H  . fluticasone  2 spray Each Nare Daily  . guaiFENesin  600 mg Oral BID  . insulin aspart  0-20 Units Subcutaneous TID WC  . insulin aspart  10 Units Subcutaneous TID WC  . insulin glargine  40 Units Subcutaneous Daily  . ipratropium-albuterol  3 mL Nebulization Q6H WA  . loratadine  10 mg Oral Daily  . methylPREDNISolone (SOLU-MEDROL) injection  60 mg Intravenous Q6H  . montelukast  10 mg Oral QHS  . oxybutynin  5 mg Oral BID  . pantoprazole  40 mg Oral q morning - 10a  . rosuvastatin  40 mg Oral QHS  . sodium chloride flush  10-40 mL Intracatheter Q12H   Continuous:  WUJ:[id number]:ALPRAZolam, cyclobenzaprine, ondansetron **OR** ondansetron (ZOFRAN) IV, sodium chloride flush, traZODone  Assesment: He was admitted with acute on chronic hypercapnic and hypoxic respiratory failure.  This is related to his known severe COPD and to healthcare associated pneumonia.  [place] requiring [place]ubation and mechanical ventilation was discharged home doing better and then got worse fairly quickly.  He did require BiPAP here but has not required intubation or ventilation.  He has significant weakness and is going to have home health physical therapy  He may require BiPAP at home.  Case management is involved  He has diabetes on sliding scale Principal Problem:   Acute and chronic respiratory failure (acute-on-chronic) (HCC) Active Problems:   Acute on chronic respiratory failure with hypercapnia (HCC)   COPD exacerbation (HCC)   HTN (hypertension)   DM (diabetes mellitus) (HCC)   Tobacco abuse   HLD (hyperlipidemia)   GERD (gastroesophageal reflux disease)   Anxiety   Chronic respiratory failure with hypoxia (HCC)   Uncontrolled type 2 diabetes mellitus with hyperglycemia (HCC)   COPD with acute exacerbation (HCC)   HCAP (healthcare-associated pneumonia)   On home O2   Elevated lactic  acid  level    Plan: Continue treatments.  He is improving.    LOS: 5 days   Fredirick Maudlin 01/30/2019, 8:17 AM

## 2019-01-30 NOTE — Clinical Social Work Note (Signed)
Spoke with patient about using NIV at home per Dr request.  Pt OK with that.  States he used a CPAP years ago, but "it got lost."  States he is fine with referral to [facility] as he was referred to [facility] at the time of his d/c from [facility] a couple of weeks ago..  CSW contacted Olegario Messier, who will review chart and follow up with CSW as to how to proceed.

## 2019-01-31 LAB — GLUCOSE, CAPILLARY
Glucose-Capillary: 123 mg/dL — ABNORMAL HIGH (ref 70–99)
Glucose-Capillary: 94 mg/dL (ref 70–99)
Glucose-Capillary: 114 mg/dL — ABNORMAL HIGH (ref 70–99)
Glucose-Capillary: 140 mg/dL — ABNORMAL HIGH (ref 70–99)
Glucose-Capillary: 44 mg/dL — CL (ref 70–99)
Glucose-Capillary: 75 mg/dL (ref 70–99)
Glucose-Capillary: 240 mg/dL — ABNORMAL HIGH (ref 70–99)

## 2019-01-31 LAB — BLOOD GAS, ARTERIAL
Acid-Base Excess: 7 mmol/L — ABNORMAL HIGH (ref 0.0–2.0)
Bicarbonate: 30.2 mmol/L — ABNORMAL HIGH (ref 20.0–28.0)
FIO2: 36
O2 Saturation: 76.7 %
Patient temperature: 36.8
pCO2 arterial: 45.2 mmHg (ref 32.0–48.0)
pH, Arterial: 7.452 — ABNORMAL HIGH (ref 7.350–7.450)
pO2, Arterial: 42.3 mmHg — ABNORMAL LOW (ref 83.0–108.0)

## 2019-01-31 NOTE — Progress Notes (Signed)
Subjective: He says he feels okay.  He is still short of breath with exertion.  He has no other new complaints.  He has coughed up a little bit of sputum.  We are trying to arrange noninvasive ventilator.  I do not think what he had before was CPAP because he has not had a sleep study.  He does not have symptoms of sleep apnea and I do not suspect that he has sleep apnea.  Objective: Vital signs in last 24 hours: Temp:  [97.4 F (36.3 C)-98.2 F (36.8 C)] 98.2 F (36.8 C) (04/29 0400) Pulse Rate:  [85-101] 88 (04/29 0700) Resp:  [15-33] 20 (04/29 0700) BP: (103-141)/(52-79) 104/54 (04/29 0700) SpO2:  [91 %-100 %] 92 % (04/29 0744) Weight:  [74.9 kg] 74.9 kg (04/29 0500) Weight change: -0.5 kg Last BM Date: 01/29/19  Intake/Output from previous day: 04/28 0701 - 04/29 0700 In: 10 [I.V.:10] Out: 1400 [Urine:1400]  PHYSICAL EXAM General appearance: alert, cooperative and no distress Resp: rhonchi bilaterally Cardio: regular rate and rhythm, S1, S2 normal, no murmur, click, rub or gallop GI: soft, non-tender; bowel sounds normal; no masses,  no organomegaly Extremities: extremities normal, atraumatic, no cyanosis or edema  Lab Results:  Results for orders placed or performed during the hospital encounter of 01/25/19 (from the past 48 hour(s))  Glucose, capillary     Status: Abnormal   Collection Time: 01/29/19  8:10 AM  Result Value Ref Range   Glucose-Capillary 110 (H) 70 - 99 mg/dL  Glucose, capillary     Status: Abnormal   Collection Time: 01/29/19 11:31 AM  Result Value Ref Range   Glucose-Capillary 155 (H) 70 - 99 mg/dL  Glucose, capillary     Status: Abnormal   Collection Time: 01/29/19  4:49 PM  Result Value Ref Range   Glucose-Capillary 158 (H) 70 - 99 mg/dL  Glucose, capillary     Status: Abnormal   Collection Time: 01/29/19  9:22 PM  Result Value Ref Range   Glucose-Capillary 121 (H) 70 - 99 mg/dL  Glucose, capillary     Status: Abnormal   Collection Time:  01/30/19  3:32 AM  Result Value Ref Range   Glucose-Capillary 122 (H) 70 - 99 mg/dL  Comprehensive metabolic panel     Status: Abnormal   Collection Time: 01/30/19  4:10 AM  Result Value Ref Range   Sodium 136 135 - 145 mmol/L   Potassium 4.1 3.5 - 5.1 mmol/L   Chloride 101 98 - 111 mmol/L   CO2 28 22 - 32 mmol/L   Glucose, Bld 158 (H) 70 - 99 mg/dL   BUN 24 (H) 8 - 23 mg/dL   Creatinine, Ser 0.98 0.61 - 1.24 mg/dL   Calcium 9.2 8.9 - 11.9 mg/dL   Total Protein 5.5 (L) 6.5 - 8.1 g/dL   Albumin 1.8 (L) 3.5 - 5.0 g/dL   AST 17 15 - 41 U/L   ALT 33 0 - 44 U/L   Alkaline Phosphatase 105 38 - 126 U/L   Total Bilirubin 0.3 0.3 - 1.2 mg/dL   GFR calc non Af Amer >60 >60 mL/min   GFR calc Af Amer >60 >60 mL/min   Anion gap 7 5 - 15    Comment: Performed at [facility], [address], [place], [place] [address]  CBC WITH DIFFERENTIAL     Status: Abnormal   Collection Time: 01/30/19  4:10 AM  Result Value Ref Range   WBC 25.5 (H) 4.0 - 10.5 K/uL  Comment: WHITE COUNT CONFIRMED ON SMEAR   RBC 3.31 (L) 4.22 - 5.81 MIL/uL   Hemoglobin 8.8 (L) 13.0 - 17.0 g/dL   HCT 16.1 (L) 09.6 - 04.5 %   MCV 88.5 80.0 - 100.0 fL   MCH 26.6 26.0 - 34.0 pg   MCHC 30.0 30.0 - 36.0 g/dL   RDW 40.9 81.1 - 91.4 %   Platelets 249 150 - 400 K/uL   nRBC 0.0 0.0 - 0.2 %   Neutrophils Relative % 95 %   Neutro Abs 24.3 (H) 1.7 - 7.7 K/uL   Lymphocytes Relative 2 %   Lymphs Abs 0.4 (L) 0.7 - 4.0 K/uL   Monocytes Relative 2 %   Monocytes Absolute 0.6 0.1 - 1.0 K/uL   Eosinophils Relative 0 %   Eosinophils Absolute 0.0 0.0 - 0.5 K/uL   Basophils Relative 0 %   Basophils Absolute 0.0 0.0 - 0.1 K/uL   Immature Granulocytes 1 %   Abs Immature Granulocytes 0.24 (H) 0.00 - 0.07 K/uL    Comment: Performed at [facility], [address] [place], [place] [address]  Glucose, capillary     Status: Abnormal   Collection Time: 01/30/19  7:42 AM  Result Value Ref Range   Glucose-Capillary 128 (H) 70 - 99 mg/dL   Glucose, capillary     Status: Abnormal   Collection Time: 01/30/19 11:15 AM  Result Value Ref Range   Glucose-Capillary 158 (H) 70 - 99 mg/dL  Glucose, capillary     Status: None   Collection Time: 01/30/19  4:58 PM  Result Value Ref Range   Glucose-Capillary 90 70 - 99 mg/dL  Glucose, capillary     Status: Abnormal   Collection Time: 01/30/19 11:25 PM  Result Value Ref Range   Glucose-Capillary 123 (H) 70 - 99 mg/dL    ABGS No results for input(s): PHART, PO2ART, TCO2, HCO3 in the last 72 hours.  Invalid input(s): PCO2 CULTURES Recent Results (from the past 240 hour(s))  Blood Culture (routine x 2)     Status: None   Collection Time: 01/25/19  7:58 AM  Result Value Ref Range Status   Specimen Description RIGHT ANTECUBITAL  Final   Special Requests   Final    BOTTLES DRAWN AEROBIC AND ANAEROBIC Blood Culture adequate volume   Culture   Final    NO GROWTH 5 DAYS Performed at [facility], [address], [place], [place] [address]    Report Status 01/30/2019 FINAL  Final  Blood Culture (routine x 2)     Status: None   Collection Time: 01/25/19  9:01 AM  Result Value Ref Range Status   Specimen Description RIGHT ANTECUBITAL  Final   Special Requests   Final    BOTTLES DRAWN AEROBIC AND ANAEROBIC Blood Culture adequate volume   Culture   Final    NO GROWTH 5 DAYS Performed at [facility], [address] [place], [place] [address]    Report Status 01/30/2019 FINAL  Final  SARS Coronavirus 2 [facility] order, Performed in [facility] hospital lab)     Status: None   Collection Time: 01/25/19 10:51 AM  Result Value Ref Range Status   SARS Coronavirus 2 NEGATIVE NEGATIVE Final    Comment: (NOTE) If result is NEGATIVE SARS-CoV-2 target nucleic acids are NOT DETECTED. The SARS-CoV-2 RNA is generally detectable in upper and lower  respiratory specimens during the acute phase of infection. The lowest  concentration of SARS-CoV-2 viral copies this assay can detect  is 250   copies / mL. A negative result does not preclude SARS-CoV-2 infection  and should not be used as the sole basis for treatment or other  patient management decisions.  A negative result may occur with  improper specimen collection / handling, submission of specimen other  than nasopharyngeal swab, presence of viral mutation(s) within the  areas targeted by this assay, and inadequate number of viral copies  (<250 copies / mL). A negative result must be combined with clinical  observations, patient history, and epidemiological information. If result is POSITIVE SARS-CoV-2 target nucleic acids are DETECTED. The SARS-CoV-2 RNA is generally detectable in upper and lower  respiratory specimens dur ing the acute phase of infection.  Positive  results are indicative of active infection with SARS-CoV-2.  Clinical  correlation with patient history and other diagnostic information is  necessary to determine patient infection status.  Positive results do  not rule out bacterial infection or co-infection with other viruses. If result is PRESUMPTIVE POSTIVE SARS-CoV-2 nucleic acids MAY BE PRESENT.   A presumptive positive result was obtained on the submitted specimen  and confirmed on repeat testing.  While 2019 novel coronavirus  (SARS-CoV-2) nucleic acids may be present in the submitted sample  additional confirmatory testing may be necessary for epidemiological  and / or clinical management purposes  to differentiate between  SARS-CoV-2 and other Sarbecovirus currently known to infect humans.  If clinically indicated additional testing with an alternate test  methodology [phone]([id number]) is advised. The SARS-CoV-2 RNA is generally  detectable in upper and lower respiratory sp ecimens during the acute  phase of infection. The expected result is Negative. Fact Sheet for Patients:  BoilerBrush.com.cyhttps://www.fda.gov/media/136312/download Fact Sheet for Healthcare  Providers: https://pope.com/https://www.fda.gov/media/136313/download This test is not yet approved or cleared by the [place] FDA and has been authorized for detection and/or diagnosis of SARS-CoV-2 by FDA under an Emergency Use Authorization (EUA).  This EUA will remain in effect (meaning this test can be used) for the duration of the COVID-19 declaration under Section 564(b)(1) of the Act, 21 U.S.C. section 360bbb-3(b)(1), unless the authorization is terminated or revoked sooner. Performed at [facility], [address] [place], [place] [address]   Respiratory Panel by PCR     Status: None   Collection Time: 01/25/19 12:32 PM  Result Value Ref Range Status   Adenovirus NOT DETECTED NOT DETECTED Final   Coronavirus 229E NOT DETECTED NOT DETECTED Final    Comment: (NOTE) The Coronavirus on the Respiratory Panel, DOES NOT test for the novel  Coronavirus (2019 nCoV)    Coronavirus HKU1 NOT DETECTED NOT DETECTED Final   Coronavirus NL63 NOT DETECTED NOT DETECTED Final   Coronavirus OC43 NOT DETECTED NOT DETECTED Final   Metapneumovirus NOT DETECTED NOT DETECTED Final   Rhinovirus / Enterovirus NOT DETECTED NOT DETECTED Final   Influenza A NOT DETECTED NOT DETECTED Final   Influenza B NOT DETECTED NOT DETECTED Final   Parainfluenza Virus 1 NOT DETECTED NOT DETECTED Final   Parainfluenza Virus 2 NOT DETECTED NOT DETECTED Final   Parainfluenza Virus 3 NOT DETECTED NOT DETECTED Final   Parainfluenza Virus 4 NOT DETECTED NOT DETECTED Final   Respiratory Syncytial Virus NOT DETECTED NOT DETECTED Final   Bordetella pertussis NOT DETECTED NOT DETECTED Final   Chlamydophila pneumoniae NOT DETECTED NOT DETECTED Final   Mycoplasma pneumoniae NOT DETECTED NOT DETECTED Final    Comment: Performed at [facility], [address] [place], [place] [address]   Studies/Results:  No results found.  Medications:  Prior to Admission:  Medications Prior to Admission  Medication Sig Dispense Refill Last  Dose  . albuterol (PROAIR HFA) 108 (90 BASE) MCG/ACT inhaler Inhale 2 puffs into the lungs every 6 (six) hours as needed for wheezing or shortness of breath. Shortness of Breath   unknown  . ALPRAZolam (XANAX) 1 MG tablet Take 0.5 tablets (0.5 mg total) by mouth 2 (two) times daily as needed for anxiety.  0   . fexofenadine-pseudoephedrine (ALLEGRA-D 24) 180-240 MG 24 hr tablet Take 1 tablet by mouth daily.   01/09/2019 at Unknown time  . fluticasone (FLONASE) 50 MCG/ACT nasal spray Place 2 sprays into both nostrils daily.   01/08/2019 at Unknown time  . glimepiride (AMARYL) 2 MG tablet Take 2 mg by mouth daily with breakfast.   01/08/2019 at Unknown time  . Guaifenesin (MUCINEX MAXIMUM STRENGTH) 1200 MG TB12 Take 1 tablet (1,200 mg total) by mouth 2 (two) times daily. 20 each 0 01/09/2019 at Unknown time  . Insulin Glargine (LANTUS) 100 UNIT/ML Solostar Pen Inject 30 Units into the skin daily. 15 mL 2   . ipratropium-albuterol (DUONEB) 0.5-2.5 (3) MG/3ML SOLN Take 3 mLs by nebulization every 4 (four) hours as needed. For shortness of breath   01/09/2019 at Unknown time  . metFORMIN (GLUCOPHAGE-XR) 500 MG 24 hr tablet Take 500 mg by mouth 2 (two) times daily.    01/09/2019 at Unknown time  . montelukast (SINGULAIR) 10 MG tablet Take 10 mg by mouth every morning.    01/08/2019 at Unknown time  . Multiple Vitamins-Minerals (CENTRUM SILVER 50+MEN PO) Take 1 tablet by mouth daily.   01/09/2019 at Unknown time  . oxybutynin (DITROPAN) 5 MG tablet Take 1 tablet (5 mg total) by mouth 2 (two) times daily.     . pantoprazole (PROTONIX) 40 MG tablet Take 40 mg by mouth every morning.    01/08/2019 at Unknown time  . predniSONE (DELTASONE) 20 MG tablet Take 1-3 tablets (20-60 mg total) by mouth daily with breakfast. Take  daily for 2days then  daily for 2 days then  daily for 2days then STOP 5 tablet 0   . rosuvastatin (CRESTOR) 40 MG tablet Take 40 mg by mouth at bedtime.    01/08/2019 at Unknown time  . TRELEGY  ELLIPTA 100-62.5-25 MCG/INH AEPB Take 2 puffs by mouth daily.   01/08/2019 at Unknown time   Scheduled: . arformoterol  15 mcg Nebulization BID  . budesonide (PULMICORT) nebulizer solution  0.5 mg Nebulization BID  . Chlorhexidine Gluconate Cloth  6 each Topical Daily  . fluticasone  2 spray Each Nare Daily  . guaiFENesin  600 mg Oral BID  . insulin aspart  0-20 Units Subcutaneous TID WC  . insulin aspart  10 Units Subcutaneous TID WC  . insulin glargine  40 Units Subcutaneous Daily  . ipratropium-albuterol  3 mL Nebulization Q6H WA  . loratadine  10 mg Oral Daily  . methylPREDNISolone (SOLU-MEDROL) injection  60 mg Intravenous Q12H  . montelukast  10 mg Oral QHS  . oxybutynin  5 mg Oral BID  . pantoprazole  40 mg Oral q morning - 10a  . rosuvastatin  40 mg Oral QHS  . sodium chloride flush  10-40 mL Intracatheter Q12H   Continuous:  WUJ:[id number], cyclobenzaprine, ondansetron **OR** ondansetron (ZOFRAN) IV, sodium chloride flush, traZODone  Assesment: He was admitted with acute on chronic hypoxic and hypercapnic respiratory failure.  He had just been hospitalized with an episode  of acute on chronic respiratory failure requiring intubation and mechanical ventilation.  He did well initially at home and then developed increasing problems and came to the emergency department.  He has been treated with BiPAP and improved.  He is on antibiotics and improving.  At baseline he has severe end-stage COPD on home oxygen and previously on BiPAP  He has chronic hypoxic and hypercapnic respiratory failure and he will have a blood gas this morning.  He has healthcare associated pneumonia which is improving  He has diabetes and is on sliding scale  He has muscle weakness and deconditioning which is about at baseline Principal Problem:   Acute and chronic respiratory failure (acute-on-chronic) (HCC) Active Problems:   Acute on chronic respiratory failure with hypercapnia (HCC)   COPD  exacerbation (HCC)   HTN (hypertension)   DM (diabetes mellitus) (HCC)   Tobacco abuse   HLD (hyperlipidemia)   GERD (gastroesophageal reflux disease)   Anxiety   Chronic respiratory failure with hypoxia (HCC)   Uncontrolled type 2 diabetes mellitus with hyperglycemia (HCC)   COPD with acute exacerbation (HCC)   HCAP (healthcare-associated pneumonia)   On home O2   Elevated lactic acid level    Plan: Continue treatments.  He will have blood gas today.  I think he would benefit from noninvasive ventilator.  He has end-stage COPD has been in and out of the hospital on multiple occasions required intubation on multiple occasions.  He has elevated PCO2 and has chronic hypoxic and hypercapnic respiratory failure.  He would require the use of NIV both at night routinely and during the day particularly during periods of exacerbation.  NIV would be used to treat his high PCO2 and reduce his risk of exacerbations hospitalizations intubation.  BiPAP is not an option at home because of its functional limitations and the severity of his condition.  If he is not able to use  NIV this could lead to his death    LOS: 6 days   Fredirick Maudlin 01/31/2019, 7:57 AM

## 2019-01-31 NOTE — TOC Progression Note (Signed)
Transition of Care [facility]) - Progression Note    Patient Details  Name: Jeff Ray MRN: [id number] Date of Birth: 03-11-1956  Transition of Care [facility]) CM/SW Contact  Elliot Gault, LCSW Phone Number: 01/31/2019, 1:01 PM  Clinical Narrative:     Pt does not meet blood gas criteria for Bipap/NIV at home. Pt needs to have blood gas level of 52 or higher. Updated Dr. Juanetta Gosling. LCSW attempting to find out if the level has to be drawn within a certain number of hours/days prior to the order for home BIPAP as pt had a pCO2 level of 59 on 4/7 during his admission at [facility]. Will follow.  Expected Discharge Plan: Home w Home Health Services Barriers to Discharge: Continued Medical Work up  Expected Discharge Plan and Services Expected Discharge Plan: Home w Home Health Services       Living arrangements for the past 2 months: Skilled Nursing Facility                             [facility] Agency: [facility]         Social Determinants of Health (SDOH) Interventions    Readmission Risk Interventions Readmission Risk Prevention Plan 01/26/2019 01/18/2019  Transportation Screening Complete Complete  HRI or Home Care Consult Complete Complete  Social Work Consult for Recovery Care Planning/Counseling Complete Not Complete  SW consult not completed comments - NA  Palliative Care Screening Not Applicable Not Applicable  Palliative Care Screening Not Complete Comments - NA  Medication Review Oceanographer) - Complete  Some recent data might be hidden

## 2019-01-31 NOTE — Progress Notes (Addendum)
CRITICAL VALUE ALERT  Critical Value:  CBG 44  Date & Time Notied:  01/31/19 @ 1619  Provider Notified: Standing Orders per protocol  Orders Received/Actions taken: Placed order set for hypoglycemia protocol. Gave patient 8 oz orange juice. Was able to drink all of it. Will check blood sugar at 1630.  Rechecked blood sugar at 1630: reading was 75. Will continue to monitor. And will monitor how much he eats for dinner. Will not give sliding scale insulin. Blood sugar check again at 2200.

## 2019-01-31 NOTE — Progress Notes (Signed)
Physical Therapy Treatment Patient Details Name: Jeff ImusKarl E Gosling MRN: [id number] DOB: 03/01/1956 Today's Date: 01/31/2019    History of Present Illness  Jeff Ray is a 63 y.o. male smoker with home oxygen dependent COPD was [facility] from a prolonged hospitalization for respiratory failure requiring intubation in the ICU, extubated on 01/10/2019 and discharged 01/12/2019 on a prednisone taper.  He was diagnosed with a pneumonia at that time and was in diabetic ketoacidosis with a hemoglobin A1c near 12%.  His coronavirus testing was negative at the time.  He showed improvement on IV steroids and bronchodilators and was eventually weaned down to 3 L of oxygen and discharged home on a prednisone taper with home health PT OT and RN.    PT Comments    Pt limited by reports of SOB with all movements.  Able to keep O2 saturation above 88% the whole session though did require increase initially to 7L O2A prior transfer and 8L O2A following for short duration to resolve SOB symptoms.  Pt able to complete bed mobility modified independent, just increased time to complete with labored movements.  Min A with STS and able to stand stable wihtout AD.  Able to make few steps over to chair due to LE weakness, fatigue and c/o SOB.  EOS pt left in chair with call bell within reach.  Average O2 saturation at 94-95% through session except for those listed above.   Follow Up Recommendations  Home health PT;Supervision for mobility/OOB;Supervision - Intermittent     Equipment Recommendations  None recommended by PT    Recommendations for Other Services OT consult     Precautions / Restrictions Precautions Precautions: Fall Precaution Comments: Home O2 dependent Restrictions Weight Bearing Restrictions: No    Mobility  Bed Mobility Overal bed mobility: Modified Independent Bed Mobility: Supine to Sit     Supine to sit: Supervision;HOB elevated     General bed  mobility comments: increased time  Transfers Overall transfer level: Needs assistance Equipment used: None;Rolling walker (2 wheeled) Transfers: Sit to/from Stand Sit to Stand: Min guard         General transfer comment: slow labored movement, increased time  Ambulation/Gait Ambulation/Gait assistance: Min guard Gait Distance (Feet): 5 Feet Assistive device: None Gait Pattern/deviations: Decreased step length - right;Decreased step length - left;Decreased stride length Gait velocity: slow   General Gait Details: limited to 4-5 labored steps at bedside mostly due to SOB   Stairs             Wheelchair Mobility    Modified Rankin (Stroke Patients Only)       Balance                                            Cognition Arousal/Alertness: Awake/alert Behavior During Therapy: WFL for tasks assessed/performed Overall Cognitive Status: Within Functional Limits for tasks assessed                                        Exercises      General Comments        Pertinent Vitals/Pain Pain Assessment: No/denies pain    Home Living  Prior Function            PT Goals (current goals can now be found in the care plan section)      Frequency    Min 3X/week      PT Plan Current plan remains appropriate    Co-evaluation              AM-PAC PT "6 Clicks" Mobility   Outcome Measure  Help needed turning from your back to your side while in a flat bed without using bedrails?: None Help needed moving from lying on your back to sitting on the side of a flat bed without using bedrails?: None Help needed moving to and from a bed to a chair (including a wheelchair)?: A Little Help needed standing up from a chair using your arms (e.g., wheelchair or bedside chair)?: A Little Help needed to walk in hospital room?: A Lot Help needed climbing 3-5 steps with a railing? : A Lot 6 Click Score:  18    End of Session Equipment Utilized During Treatment: Oxygen Activity Tolerance: Patient limited by fatigue(Limited by SOB wiht O2 sat as low at 88% following transfer, by at 94% with 6L atEOS) Patient left: in chair;with call bell/phone within reach Nurse Communication: Mobility status PT Visit Diagnosis: Other abnormalities of gait and mobility (R26.89);Muscle weakness (generalized) (M62.81)     Time: 1430-1450 PT Time Calculation (min) (ACUTE ONLY): 20 min  Charges:  $Therapeutic Activity: 8-22 mins                    [address] LPTA; CBIS [phone]   Juel Burrow 01/31/2019, 3:30 PM

## 2019-01-31 NOTE — Progress Notes (Addendum)
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  He was diagnosed with pneumonia.  MDM/Assessment & Plan:   1. Acute on chronic respiratory failure with hypoxia and hypercapnia-he is slowly improving with treatments.  He needs BiPAP therapy at home.  Dr. Juanetta Gosling with pulmonology is working closely with the care management team to try to make arrangements for him to have home BiPAP.  Without BiPAP at home he likely would die from respiratory failure.  He urgently needs this therapy at home.  Morning ABG indicates that CO2 improves with BiPAP therapy. 2. COPD exacerbation- the patient is being treated with Pulmicort and Brovana.  He is on IV steroids and nebulizer treatments for bronchodilators.  He completed a course of antibiotics. 3. Type 2 diabetes mellitus poorly controlled with hyperglycemia as evidenced by hemoglobin A1c greater than 11%.  Continue Lantus and meal coverage.  Continue to monitor CBGs.  Sugars have been fairly well controlled. 4. Hyperlipidemia-continue statin therapy. 5. Tobacco abuse- the patient reports that he quit all tobacco use 6 years ago. 6. Generalized anxiety disorder- he is on alprazolam for anxiety which seems to control his symptoms.  DVT prophylaxis: Lovenox Code Status: Full Family Communication: Spoke with sister by telephone for update, unable to reach wife by telephone Disposition Plan: If we are able to arrange home BiPAP possibly discharge tomorrow   Consultants:  pulmonology  Procedures:    Antimicrobials:  Meropenem 4/23-4/24  Doxycycline 4/24-4/28  Subjective: Patient reports that he is feeling a little better today he is less short of breath than  he was initially.  He remains weak.  Objective: Vitals:   01/31/19 0700 01/31/19 0744 01/31/19 0800 01/31/19 0900  BP: (!) 104/54  131/70   Pulse: 88  95   Resp: 20  18   Temp:  98.4 F (36.9 C)    TempSrc:  Oral    SpO2: 99% 92% 94% 95%  Weight:      Height:        Intake/Output Summary (Last 24 hours) at 01/31/2019 0950 Last data filed at 01/31/2019 0500 Gross per 24 hour  Intake -  Output 1400 ml  Net -1400 ml   Filed Weights   01/29/19 0400 01/30/19 0500 01/31/19 0500  Weight: 75.1 kg 75.4 kg 74.9 kg     REVIEW OF SYSTEMS  As per history otherwise all reviewed and reported negative  Exam:  General exam: Chronically ill-appearing male he is lying in the bed he is awake and alert in no apparent distress. Respiratory system: Poor air movement bilateral.  No increased work of breathing. Cardiovascular system: S1 & S2 heard. No JVD, murmurs, gallops, clicks or pedal edema. Gastrointestinal system: Abdomen is nondistended, soft and nontender. Normal bowel sounds heard. Central nervous system: Alert and oriented. No focal neurological deficits. Extremities: No cyanosis but does have clubbing.  Data Reviewed: Basic Metabolic Panel: Recent Labs  Lab 01/26/19 0542 01/27/19 0412 01/28/19 0640 01/29/19 0513 01/30/19 0410  NA 133* 133* 133* 137 136  K 4.4 4.4 4.3 4.0 4.1  CL 98 98 98 102 101  CO2 GLUCOSE 252* 243* 301* 98 158*  BUN 16 18 24* 23 24*  CREATININE 0.73 0.79 0.90 0.79 0.74  CALCIUM 8.9 9.2 9.1 9.3 9.2  MG 2.1 2.1  --   --   --    Liver Function Tests: Recent Labs  Lab 01/26/19 0542 01/27/19 0412 01/28/19 0640 01/29/19 0513 01/30/19 0410  AST 12* 15 17 16 17   ALT 30 26 32 30 33  ALKPHOS 133* 121 122 116 105  BILITOT 0.5 0.1* 0.3 0.3 0.3  PROT 5.7* 5.3* 5.7* 5.5* 5.5*  ALBUMIN 1.9* 1.7* 1.9* 1.9* 1.8*   No results for input(s): LIPASE, AMYLASE in the last 168 hours. No results for input(s): AMMONIA in the last 168 hours.  CBC: Recent Labs  Lab 01/26/19 0542 01/27/19 0412 01/28/19 0640 01/29/19 0513 01/30/19 0410  WBC 32.2* 30.6* 25.1* 24.5* 25.5*  NEUTROABS 31.1* 29.4* 24.0* 23.2* 24.3*  HGB 10.6* 9.5* 9.3* 9.3* 8.8*  HCT 35.3* 31.0* 29.7* 30.6* 29.3*  MCV 88.5 87.6 86.3 87.9 88.5  PLT 233 260 286 285 249   Cardiac Enzymes: No results for input(s): CKTOTAL, CKMB, CKMBINDEX, TROPONINI in the last 168 hours. CBG (last 3)  Recent Labs    01/30/19 1115 01/30/19 1658 01/30/19 2325  GLUCAP 158* 90 123*   Recent Results (from the past 240 hour(s))  Blood Culture (routine x 2)     Status: None   Collection Time: 01/25/19  7:58 AM  Result Value Ref Range Status   Specimen Description RIGHT ANTECUBITAL  Final   Special Requests   Final    BOTTLES DRAWN AEROBIC AND ANAEROBIC Blood Culture adequate volume   Culture   Final    NO GROWTH 5 DAYS Performed at [facility], [address] [place], [place] [address]    Report Status 01/30/2019 FINAL  Final  Blood Culture (routine x 2)     Status: None   Collection Time: 01/25/19  9:01 AM  Result Value Ref Range Status   Specimen Description RIGHT ANTECUBITAL  Final   Special Requests   Final    BOTTLES DRAWN AEROBIC AND ANAEROBIC Blood Culture adequate volume   Culture   Final    NO GROWTH 5 DAYS Performed at [facility], [address], [place], [place] [address]    Report Status 01/30/2019 FINAL  Final  SARS Coronavirus 2 [facility] order, Performed in [facility] hospital lab)     Status: None   Collection Time: 01/25/19 10:51 AM  Result Value Ref Range Status   SARS Coronavirus 2 NEGATIVE NEGATIVE Final    Comment: (NOTE) If result is NEGATIVE SARS-CoV-2 target nucleic acids are NOT DETECTED. The SARS-CoV-2 RNA is generally detectable in upper and lower  respiratory specimens during the acute phase of infection. The lowest  concentration of SARS-CoV-2 viral copies this assay can detect is 250  copies / mL. A negative result does not  preclude SARS-CoV-2 infection  and should not be used as the sole basis for treatment or other  patient management decisions.  A negative result may occur with  improper specimen collection / handling, submission of specimen other  than nasopharyngeal swab, presence of viral mutation(s) within the  areas targeted by this assay, and inadequate number of viral copies  (<250 copies / mL). A negative result must be combined with clinical  observations, patient history, and epidemiological information. If result is POSITIVE SARS-CoV-2 target nucleic acids are DETECTED. The SARS-CoV-2 RNA is generally detectable in upper and lower  respiratory specimens dur ing the acute phase of infection.  Positive  results are indicative of active infection with SARS-CoV-2.  Clinical  correlation with patient history and other diagnostic information is  necessary to determine patient infection status.  Positive results do  not rule out bacterial infection or co-infection with other viruses. If result is PRESUMPTIVE POSTIVE SARS-CoV-2 nucleic acids MAY BE PRESENT.   A presumptive positive result was obtained on the submitted specimen  and confirmed on repeat testing.  While 2019 novel coronavirus  (SARS-CoV-2) nucleic acids may be present in the submitted sample  additional confirmatory testing may be necessary for epidemiological  and / or clinical management purposes  to differentiate between  SARS-CoV-2 and other Sarbecovirus currently known to infect humans.  If clinically indicated additional testing with an alternate test  methodology [phone]) is advised. The SARS-CoV-2 RNA is generally  detectable in upper and lower respiratory sp ecimens during the acute  phase of infection. The expected result is Negative. Fact Sheet for Patients:  BoilerBrush.com.cy Fact Sheet for Healthcare Providers: https://pope.com/ This test is not yet approved or cleared by  the [place] FDA and has been authorized for detection and/or diagnosis of SARS-CoV-2 by FDA under an Emergency Use Authorization (EUA).  This EUA will remain in effect (meaning this test can be used) for the duration of the COVID-19 declaration under Section 564(b)(1) of the Act, 21 U.S.C. section 360bbb-3(b)(1), unless the authorization is terminated or revoked sooner. Performed at [facility], [address] [place], [place] [address]   Respiratory Panel by PCR     Status: None   Collection Time: 01/25/19 12:32 PM  Result Value Ref Range Status   Adenovirus NOT DETECTED NOT DETECTED Final   Coronavirus 229E NOT DETECTED NOT DETECTED Final    Comment: (NOTE) The Coronavirus on the Respiratory Panel, DOES NOT test for the novel  Coronavirus (2019 nCoV)    Coronavirus HKU1 NOT DETECTED NOT DETECTED Final   Coronavirus NL63 NOT DETECTED NOT DETECTED Final   Coronavirus OC43 NOT DETECTED NOT DETECTED Final   Metapneumovirus NOT DETECTED NOT DETECTED Final   Rhinovirus / Enterovirus NOT DETECTED NOT DETECTED Final   Influenza A NOT DETECTED NOT DETECTED Final   Influenza B NOT DETECTED NOT DETECTED Final   Parainfluenza Virus 1 NOT DETECTED NOT DETECTED Final   Parainfluenza Virus 2 NOT DETECTED NOT DETECTED Final   Parainfluenza Virus 3 NOT DETECTED NOT DETECTED Final   Parainfluenza Virus 4 NOT DETECTED NOT DETECTED Final   Respiratory Syncytial Virus NOT DETECTED NOT DETECTED Final   Bordetella pertussis NOT DETECTED NOT DETECTED Final   Chlamydophila pneumoniae NOT DETECTED NOT DETECTED Final   Mycoplasma pneumoniae NOT DETECTED NOT DETECTED Final    Comment: Performed at [facility], [address] [place], [place] [address]     Studies: No results found.   Scheduled Meds: . arformoterol  15 mcg Nebulization BID  . budesonide (PULMICORT) nebulizer solution  0.5 mg Nebulization BID  . Chlorhexidine Gluconate Cloth  6 each Topical Daily  . fluticasone  2  spray Each Nare Daily  . guaiFENesin  600 mg Oral BID  . insulin aspart  0-20 Units Subcutaneous TID WC  . insulin aspart  10 Units Subcutaneous TID WC  . insulin glargine  40 Units Subcutaneous Daily  . ipratropium-albuterol  3 mL Nebulization Q6H WA  . loratadine  10 mg Oral Daily  . methylPREDNISolone (SOLU-MEDROL) injection  60 mg Intravenous Q12H  . montelukast  10 mg Oral QHS  . oxybutynin  5 mg Oral BID  . pantoprazole  40 mg Oral q morning -  10a  . rosuvastatin  40 mg Oral QHS  . sodium chloride flush  10-40 mL Intracatheter Q12H   Continuous Infusions:  Principal Problem:   Acute and chronic respiratory failure (acute-on-chronic) (HCC) Active Problems:   Acute on chronic respiratory failure with hypercapnia (HCC)   COPD exacerbation (HCC)   HTN (hypertension)   DM (diabetes mellitus) (HCC)   Tobacco abuse   HLD (hyperlipidemia)   GERD (gastroesophageal reflux disease)   Anxiety   Chronic respiratory failure with hypoxia (HCC)   Uncontrolled type 2 diabetes mellitus with hyperglycemia (HCC)   COPD with acute exacerbation (HCC)   HCAP (healthcare-associated pneumonia)   On home O2   Elevated lactic acid level  Critical Care Time spent: 33 minutes   Standley Dakinslanford Ehren Berisha, MD [facility] 01/31/2019, 9:50 AM    LOS: 6 days  How to contact the [facility] Attending or Consulting provider 7A - 7P or covering provider during after hours 7P -7A, for this patient?  1. Check the care team in [facility] and look for a) attending/consulting TR[facility] provider listed and b) the [facility] team listed 2. Log into [email or web address] and use [place]'s universal password to access. If you do not have the password, please contact the hospital operator. 3. Locate the [facility] provider you are looking for under [facility] and page to a number that you can be directly reached. 4. If you still have difficulty reaching the provider, please page the [facility] (Director on Call) for the Hospitalists listed on amion for  assistance.

## 2019-02-01 ENCOUNTER — Encounter (HOSPITAL_COMMUNITY): Payer: Self-pay | Admitting: *Deleted

## 2019-02-01 ENCOUNTER — Emergency Department (HOSPITAL_COMMUNITY): Payer: Medicare HMO

## 2019-02-01 ENCOUNTER — Other Ambulatory Visit: Payer: Self-pay

## 2019-02-01 ENCOUNTER — Observation Stay (HOSPITAL_COMMUNITY): Payer: Medicare HMO

## 2019-02-01 ENCOUNTER — Inpatient Hospital Stay (HOSPITAL_COMMUNITY)
Admission: EM | Admit: 2019-02-01 | Discharge: 2019-02-05 | DRG: 190 | Disposition: A | Discharge status: Home Health | Payer: Medicare HMO | Attending: Family Medicine | Admitting: Family Medicine

## 2019-02-01 DIAGNOSIS — Z87891 Personal history of nicotine dependence: Secondary | ICD-10-CM

## 2019-02-01 DIAGNOSIS — E785 Hyperlipidemia, unspecified: Secondary | ICD-10-CM | POA: Diagnosis present

## 2019-02-01 DIAGNOSIS — F411 Generalized anxiety disorder: Secondary | ICD-10-CM | POA: Diagnosis present

## 2019-02-01 DIAGNOSIS — Z7952 Long term (current) use of systemic steroids: Secondary | ICD-10-CM

## 2019-02-01 DIAGNOSIS — R0602 Shortness of breath: Secondary | ICD-10-CM | POA: Diagnosis not present

## 2019-02-01 DIAGNOSIS — J9621 Acute and chronic respiratory failure with hypoxia: Secondary | ICD-10-CM | POA: Diagnosis present

## 2019-02-01 DIAGNOSIS — J441 Chronic obstructive pulmonary disease with (acute) exacerbation: Secondary | ICD-10-CM | POA: Diagnosis not present

## 2019-02-01 DIAGNOSIS — Z794 Long term (current) use of insulin: Secondary | ICD-10-CM

## 2019-02-01 DIAGNOSIS — I2699 Other pulmonary embolism without acute cor pulmonale: Secondary | ICD-10-CM | POA: Clinically undetermined

## 2019-02-01 DIAGNOSIS — E1165 Type 2 diabetes mellitus with hyperglycemia: Secondary | ICD-10-CM | POA: Diagnosis present

## 2019-02-01 DIAGNOSIS — E119 Type 2 diabetes mellitus without complications: Secondary | ICD-10-CM

## 2019-02-01 DIAGNOSIS — I10 Essential (primary) hypertension: Secondary | ICD-10-CM | POA: Diagnosis present

## 2019-02-01 DIAGNOSIS — J9620 Acute and chronic respiratory failure, unspecified whether with hypoxia or hypercapnia: Secondary | ICD-10-CM | POA: Diagnosis present

## 2019-02-01 DIAGNOSIS — F419 Anxiety disorder, unspecified: Secondary | ICD-10-CM | POA: Diagnosis present

## 2019-02-01 DIAGNOSIS — Z79899 Other long term (current) drug therapy: Secondary | ICD-10-CM

## 2019-02-01 DIAGNOSIS — K219 Gastro-esophageal reflux disease without esophagitis: Secondary | ICD-10-CM | POA: Diagnosis present

## 2019-02-01 DIAGNOSIS — R0902 Hypoxemia: Secondary | ICD-10-CM

## 2019-02-01 DIAGNOSIS — E162 Hypoglycemia, unspecified: Secondary | ICD-10-CM

## 2019-02-01 DIAGNOSIS — Z833 Family history of diabetes mellitus: Secondary | ICD-10-CM

## 2019-02-01 DIAGNOSIS — Z9981 Dependence on supplemental oxygen: Secondary | ICD-10-CM

## 2019-02-01 DIAGNOSIS — E1169 Type 2 diabetes mellitus with other specified complication: Secondary | ICD-10-CM

## 2019-02-01 LAB — CBC
HCT: 41.7 % (ref 39.0–52.0)
Hemoglobin: 12.5 g/dL — ABNORMAL LOW (ref 13.0–17.0)
MCH: 26.9 pg (ref 26.0–34.0)
MCHC: 30 g/dL (ref 30.0–36.0)
MCV: 89.7 fL (ref 80.0–100.0)
Platelets: 242 10*3/uL (ref 150–400)
RBC: 4.65 MIL/uL (ref 4.22–5.81)
RDW: 15.2 % (ref 11.5–15.5)
WBC: 26.7 10*3/uL — ABNORMAL HIGH (ref 4.0–10.5)
nRBC: 0.1 % (ref 0.0–0.2)

## 2019-02-01 LAB — BASIC METABOLIC PANEL
Anion gap: 10 (ref 5–15)
BUN: 22 mg/dL (ref 8–23)
CO2: 31 mmol/L (ref 22–32)
Calcium: 9.8 mg/dL (ref 8.9–10.3)
Chloride: 98 mmol/L (ref 98–111)
Creatinine, Ser: 1.01 mg/dL (ref 0.61–1.24)
GFR calc Af Amer: >60 mL/min (ref >60)
GFR calc non Af Amer: >60 mL/min (ref >60)
Glucose, Bld: 69 mg/dL — ABNORMAL LOW (ref 70–99)
Potassium: 4.2 mmol/L (ref 3.5–5.1)
Sodium: 139 mmol/L (ref 135–145)

## 2019-02-01 LAB — BLOOD GAS, ARTERIAL
Acid-Base Excess: 6.1 mmol/L — ABNORMAL HIGH (ref 0.0–2.0)
Bicarbonate: 29.5 mmol/L — ABNORMAL HIGH (ref 20.0–28.0)
Delivery systems: POSITIVE
Drawn by: 41977
Expiratory PAP: 6
FIO2: 60
Inspiratory PAP: 12
O2 Saturation: 96 %
Patient temperature: 37
RATE: 8 resp/min
pCO2 arterial: 47.7 mmHg (ref 32.0–48.0)
pH, Arterial: 7.421 (ref 7.350–7.450)
pO2, Arterial: 89.2 mmHg (ref 83.0–108.0)

## 2019-02-01 LAB — GLUCOSE, CAPILLARY
Glucose-Capillary: 169 mg/dL — ABNORMAL HIGH (ref 70–99)
Glucose-Capillary: 149 mg/dL — ABNORMAL HIGH (ref 70–99)
Glucose-Capillary: 124 mg/dL — ABNORMAL HIGH (ref 70–99)

## 2019-02-01 LAB — CBG MONITORING, ED
Glucose-Capillary: 56 mg/dL — ABNORMAL LOW (ref 70–99)
Glucose-Capillary: 73 mg/dL (ref 70–99)
Glucose-Capillary: 93 mg/dL (ref 70–99)
Glucose-Capillary: 84 mg/dL (ref 70–99)

## 2019-02-01 MED ORDER — FLUTICASONE-UMECLIDIN-VILANT 100-62.5-25 MCG/INH IN AEPB: 2.0000 | INHALATION_SPRAY | Freq: Every day | RESPIRATORY_TRACT | Status: DC

## 2019-02-01 MED ORDER — PREDNISONE 20 MG PO TABS: ORAL_TABLET | ORAL | 0 refills | Status: DC

## 2019-02-01 MED ORDER — ACETAMINOPHEN 325 MG PO TABS
650.0000 mg | ORAL_TABLET | Freq: Four times a day (QID) | ORAL | Status: DC | PRN
  Administered 2019-02-04 – 2019-02-05 (×3): 650 mg via ORAL
  Filled 2019-02-01 (×3): qty 2

## 2019-02-01 MED ORDER — FLUTICASONE FUROATE-VILANTEROL 100-25 MCG/INH IN AEPB
1.0000 | INHALATION_SPRAY | Freq: Every day | RESPIRATORY_TRACT | Status: DC
  Administered 2019-02-03 – 2019-02-05 (×3): 1 via RESPIRATORY_TRACT
  Filled 2019-02-01: qty 28

## 2019-02-01 MED ORDER — GUAIFENESIN ER 600 MG PO TB12
1200.0000 mg | ORAL_TABLET | Freq: Two times a day (BID) | ORAL | Status: DC
  Administered 2019-02-02 – 2019-02-05 (×7): 1200 mg via ORAL
  Filled 2019-02-01 (×7): qty 2

## 2019-02-01 MED ORDER — INSULIN ASPART 100 UNIT/ML ~~LOC~~ SOLN
8.0000 [IU] | Freq: Three times a day (TID) | SUBCUTANEOUS | Status: DC
  Administered 2019-02-01: 8 [IU] via SUBCUTANEOUS

## 2019-02-01 MED ORDER — PANTOPRAZOLE SODIUM 40 MG PO TBEC
40.0000 mg | DELAYED_RELEASE_TABLET | Freq: Every morning | ORAL | Status: DC
  Administered 2019-02-02 – 2019-02-05 (×4): 40 mg via ORAL
  Filled 2019-02-01 (×4): qty 1

## 2019-02-01 MED ORDER — PREDNISONE 20 MG PO TABS
60.0000 mg | ORAL_TABLET | Freq: Every day | ORAL | Status: DC
  Administered 2019-02-02 – 2019-02-05 (×4): 60 mg via ORAL
  Filled 2019-02-01 (×4): qty 3

## 2019-02-01 MED ORDER — ROSUVASTATIN CALCIUM 20 MG PO TABS
40.0000 mg | ORAL_TABLET | Freq: Every day | ORAL | Status: DC
  Administered 2019-02-02 – 2019-02-04 (×3): 40 mg via ORAL
  Filled 2019-02-01: qty 1
  Filled 2019-02-01 (×3): qty 2

## 2019-02-01 MED ORDER — INSULIN GLARGINE 100 UNIT/ML ~~LOC~~ SOLN
15.0000 [IU] | Freq: Every day | SUBCUTANEOUS | Status: DC
  Filled 2019-02-01 (×4): qty 0.15

## 2019-02-01 MED ORDER — HEPARIN SODIUM (PORCINE) 5000 UNIT/ML IJ SOLN
5000.0000 [IU] | Freq: Three times a day (TID) | INTRAMUSCULAR | Status: DC
  Administered 2019-02-02 – 2019-02-05 (×10): 5000 [IU] via SUBCUTANEOUS
  Filled 2019-02-01 (×10): qty 1

## 2019-02-01 MED ORDER — DEXTROSE-NACL 5-0.45 % IV SOLN
INTRAVENOUS | Status: DC
  Administered 2019-02-01 – 2019-02-02 (×2): via INTRAVENOUS

## 2019-02-01 MED ORDER — GLUCAGON HCL RDNA (DIAGNOSTIC) 1 MG IJ SOLR
1.0000 mg | Freq: Once | INTRAMUSCULAR | Status: AC
  Administered 2019-02-01: 1 mg via INTRAMUSCULAR
  Filled 2019-02-01: qty 1

## 2019-02-01 MED ORDER — ACETAMINOPHEN 650 MG RE SUPP: 650.0000 mg | Freq: Four times a day (QID) | RECTAL | Status: DC | PRN

## 2019-02-01 MED ORDER — FLUTICASONE PROPIONATE 50 MCG/ACT NA SUSP
2.0000 | Freq: Every day | NASAL | Status: DC
  Administered 2019-02-02 – 2019-02-05 (×4): 2 via NASAL
  Filled 2019-02-01 (×2): qty 16

## 2019-02-01 MED ORDER — LORATADINE 10 MG PO TABS
10.0000 mg | ORAL_TABLET | Freq: Every day | ORAL | Status: DC
  Administered 2019-02-02 – 2019-02-05 (×4): 10 mg via ORAL
  Filled 2019-02-01 (×4): qty 1

## 2019-02-01 MED ORDER — IPRATROPIUM-ALBUTEROL 0.5-2.5 (3) MG/3ML IN SOLN
3.0000 mL | Freq: Once | RESPIRATORY_TRACT | Status: AC
  Administered 2019-02-01: 3 mL via RESPIRATORY_TRACT
  Filled 2019-02-01: qty 3

## 2019-02-01 MED ORDER — OXYBUTYNIN CHLORIDE 5 MG PO TABS
5.0000 mg | ORAL_TABLET | Freq: Two times a day (BID) | ORAL | Status: DC
  Administered 2019-02-02 – 2019-02-05 (×7): 5 mg via ORAL
  Filled 2019-02-01 (×9): qty 1

## 2019-02-01 MED ORDER — UMECLIDINIUM BROMIDE 62.5 MCG/INH IN AEPB
1.0000 | INHALATION_SPRAY | Freq: Every day | RESPIRATORY_TRACT | Status: DC
  Administered 2019-02-03 – 2019-02-05 (×3): 1 via RESPIRATORY_TRACT
  Filled 2019-02-01: qty 7

## 2019-02-01 MED ORDER — ALPRAZOLAM 0.5 MG PO TABS
0.5000 mg | ORAL_TABLET | Freq: Two times a day (BID) | ORAL | Status: DC | PRN
  Administered 2019-02-02 – 2019-02-05 (×7): 0.5 mg via ORAL
  Filled 2019-02-01 (×7): qty 1

## 2019-02-01 MED ORDER — MONTELUKAST SODIUM 10 MG PO TABS
10.0000 mg | ORAL_TABLET | Freq: Every morning | ORAL | Status: DC
  Administered 2019-02-02 – 2019-02-05 (×4): 10 mg via ORAL
  Filled 2019-02-01 (×4): qty 1

## 2019-02-01 MED ORDER — ALBUTEROL SULFATE (2.5 MG/3ML) 0.083% IN NEBU
3.0000 mL | INHALATION_SOLUTION | Freq: Four times a day (QID) | RESPIRATORY_TRACT | Status: DC | PRN
  Administered 2019-02-03: 12:00:00 3 mL via RESPIRATORY_TRACT
  Filled 2019-02-01: qty 3

## 2019-02-01 MED ORDER — METFORMIN HCL ER 500 MG PO TB24: 1000.0000 mg | ORAL_TABLET | Freq: Two times a day (BID) | ORAL | Status: AC

## 2019-02-01 MED ORDER — IPRATROPIUM-ALBUTEROL 0.5-2.5 (3) MG/3ML IN SOLN
3.0000 mL | Freq: Four times a day (QID) | RESPIRATORY_TRACT | Status: DC
  Administered 2019-02-01 – 2019-02-02 (×4): 3 mL via RESPIRATORY_TRACT
  Filled 2019-02-01 (×4): qty 3

## 2019-02-01 MED ORDER — ADULT MULTIVITAMIN W/MINERALS CH
1.0000 | ORAL_TABLET | Freq: Every day | ORAL | Status: DC
  Administered 2019-02-02 – 2019-02-05 (×4): 1 via ORAL
  Filled 2019-02-01 (×4): qty 1

## 2019-02-01 MED ORDER — SODIUM CHLORIDE 0.9 % IV SOLN: INTRAVENOUS | Status: DC

## 2019-02-01 NOTE — ED Provider Notes (Signed)
9:10 PM-he is alert and conversant.  He complains of being cold at this time.  He has IV access at that time.  The patient denies fever, cough or vomiting.  The patient was seen and admitted and discharged earlier today.  He was felt to need BiPAP, but unable to have it at home today.  He had to come back because of trouble breathing.  Patient has hypoxia, requiring placement on BiPAP.  He improved with normal oxygenation on BiPAP.  Hypoglycemia treated with oral glucose and IV glucagon.  Medical decision making-patient unstable, with hyperglycemia, and hypoxia.  Patient will require hospitalization, for stabilization, prior to consideration for discharge again.  Patient required D5 half-normal saline, for persistent hypoglycemia.  Doubt acute bacterial pneumonia, or Covid-19 infection.  Suspect primary process is COPD exacerbation.  Persistent WBC elevation, likely related to aggressive steroid treatment in the last several weeks for he has respiratory distress.  9:24 PM-Consult complete with hospitalist. Patient case explained and discussed.  He agrees to admit patient for further evaluation and treatment. Call ended at 9:35 PM       Mancel Bale, MD 02/01/19 2227

## 2019-02-01 NOTE — Progress Notes (Signed)
He says he feels better and hopes to go home soon.  No new complaints.  He was hypoglycemic yesterday.  He is apparently not qualified for BiPAP based on insurance company criteria but he does have access to a BiPAP machine at home and I have encouraged him to use that.  I will plan to follow more peripherally.  Thanks for allowing me to participate in his care

## 2019-02-01 NOTE — ED Notes (Signed)
Pt provided cup of Orange Juice to increase glucose level.

## 2019-02-01 NOTE — TOC Transition Note (Signed)
Transition of Care [facility]) - CM/SW Discharge Note   Patient Details  Name: Jeff Ray MRN: [id number] Date of Birth: 06-15-1956  [facility] CM/SW Contact:  Shade Flood, LCSW Phone Number: 02/01/2019, 1:21 PM   Clinical Narrative:     Pt stable for dc today. Met with pt who is looking forward to getting home. HH to follow. Ultimately arranged for pt to get NIV at home. [facility] will bring to pt tomorrow. Dr. Luan Pulling updated.   Final next level of care: [place] Barriers to Discharge: Barriers Resolved   Patient Goals and CMS Choice        Discharge Placement                       Discharge Plan and Services                DME Arranged: NIV DME Agency: [facility] Date DME Agency Contacted: 01/30/19   Representative spoke with at DME Agency: [place] Arranged: RN, PT, OT, Respirator Therapy [place] Agency: [place] ([place]) Date [facility] Agency Contacted: 02/01/19 Time [place]: 1000 Representative spoke with at [place]: [place] ([place]) Interventions     Readmission Risk Interventions Readmission Risk Prevention Plan 01/26/2019 01/18/2019  Transportation Screening Complete Complete  HRI or [place] Complete Complete  Social Work Consult for [place] Planning/Counseling Complete Not Complete  SW consult not completed comments - NA  Palliative Care Screening Not Applicable Not Applicable  Palliative Care Screening Not Complete Comments - NA  Medication Review Press photographer) - Complete  Some recent data might be hidden

## 2019-02-01 NOTE — ED Provider Notes (Addendum)
[facility] EMERGENCY DEPARTMENT Provider Note   CSN: [id number] Arrival date & time: 02/01/19  1836    History   Chief Complaint Chief Complaint  Patient presents with  . Shortness of Breath    HPI Jeff Ray is a 63 y.o. male.     HPI Patient discharged from the hospital around 3 hours ago.  States he was doing better when he was discharged but wife made him anxious when he got home.  States that she was asking him to do things that he could not do.  States he started having more shortness of breath.  Patient reportedly has sats of 96% for EMS.  Has a history of end-stage COPD.  Reportedly feeling better upon arrival.  On my exam states he is feeling much better, however a few minutes later became much more short of breath again.  Sats went down into the 70s at this time.  Started on higher level of oxygen.  Still somewhat quiet breath sounds.  Started on BiPAP in the ER.  Has not had increase in his cough.  Patient has end-stage COPD and is supposed to be started on Bipap or NIV at home tomorrow. Past Medical History:  Diagnosis Date  . Anxiety   . Asthma   . COPD (chronic obstructive pulmonary disease) (HCC)   . Diabetes mellitus   . History of nuclear stress test 02/2016   low risk study  . Hypertension   . On home O2    2L N/C   . Pneumonia     Patient Active Problem List   Diagnosis Date Noted  . Acute on chronic respiratory failure (HCC) 02/01/2019  . Acute and chronic respiratory failure (acute-on-chronic) (HCC) 01/25/2019  . HCAP (healthcare-associated pneumonia) 01/25/2019  . On home O2 01/25/2019  . Elevated lactic acid level 01/25/2019  . Pneumonia 01/09/2019  . Acute metabolic encephalopathy 01/09/2019  . Diabetic ketoacidosis with coma associated with type 2 diabetes mellitus (HCC)   . Acute respiratory failure with hypoxia (HCC) 10/28/2018  . COPD with acute exacerbation (HCC) 09/09/2018  . Chronic respiratory failure with hypoxia (HCC) 09/08/2018   . Uncontrolled type 2 diabetes mellitus with hyperglycemia (HCC) 09/08/2018  . Elevated troponin 09/08/2018  . SIRS (systemic inflammatory response syndrome) (HCC) 09/07/2018  . Asthma 09/07/2018  . HLD (hyperlipidemia) 09/07/2018  . GERD (gastroesophageal reflux disease) 09/07/2018  . Anxiety 09/07/2018  . BPH (benign prostatic hyperplasia) 09/07/2018  . Anemia 08/03/2015  . Acute on chronic respiratory failure with hypercapnia (HCC) 05/26/2013  . COPD exacerbation (HCC) 05/26/2013  . HTN (hypertension) 05/26/2013  . DM (diabetes mellitus) (HCC) 05/26/2013  . Tobacco abuse 05/26/2013    Past Surgical History:  Procedure Laterality Date  . BACK SURGERY          Home Medications    Prior to Admission medications   Medication Sig Start Date End Date Taking? Authorizing Provider  albuterol (PROAIR HFA) 108 (90 BASE) MCG/ACT inhaler Inhale 2 puffs into the lungs every 6 (six) hours as needed for wheezing or shortness of breath. Shortness of Breath   Yes [provider]  ALPRAZolam (XANAX) 1 MG tablet Take 0.5 tablets (0.5 mg total) by mouth 2 (two) times daily as needed for anxiety. 01/19/19  Yes Zannie CoveJoseph, Preetha, MD  fexofenadine-pseudoephedrine (ALLEGRA-D 24) 180-240 MG 24 hr tablet Take 1 tablet by mouth daily.   Yes [provider]  fluticasone (FLONASE) 50 MCG/ACT nasal spray Place 2 sprays into both nostrils daily. 09/30/17  Yes [provider]  glimepiride (AMARYL) 2 MG tablet Take 2 mg by mouth daily with breakfast.   Yes [provider]  Guaifenesin (MUCINEX MAXIMUM STRENGTH) 1200 MG TB12 Take 1 tablet (1,200 mg total) by mouth 2 (two) times daily. 12/11/18  Yes Emokpae, Courage, MD  Insulin Glargine (LANTUS) 100 UNIT/ML Solostar Pen Inject 30 Units into the skin daily. 01/19/19  Yes Zannie Cove, MD  ipratropium-albuterol (DUONEB) 0.5-2.5 (3) MG/3ML SOLN Take 3 mLs by nebulization every 4 (four) hours as needed. For shortness of breath   Yes  [provider]  metFORMIN (GLUCOPHAGE-XR) 500 MG 24 hr tablet Take 2 tablets (1,000 mg total) by mouth 2 (two) times daily. 02/01/19  Yes Johnson, Clanford L, MD  montelukast (SINGULAIR) 10 MG tablet Take 10 mg by mouth every morning.    Yes [provider]  Multiple Vitamins-Minerals (CENTRUM SILVER 50+MEN PO) Take 1 tablet by mouth daily.   Yes [provider]  oxybutynin (DITROPAN) 5 MG tablet Take 1 tablet (5 mg total) by mouth 2 (two) times daily. 01/19/19  Yes Zannie Cove, MD  pantoprazole (PROTONIX) 40 MG tablet Take 40 mg by mouth every morning.    Yes [provider]  predniSONE (DELTASONE) 20 MG tablet Take 3 PO QAM x5days, 2 PO QAM x5days, 1 PO QAM x5days 02/01/19  Yes Johnson, Clanford L, MD  rosuvastatin (CRESTOR) 40 MG tablet Take 40 mg by mouth at bedtime.  07/24/17  Yes [provider]  TRELEGY ELLIPTA 100-62.5-25 MCG/INH AEPB Take 2 puffs by mouth daily. 11/17/18  Yes [provider]    Family History Family History  Problem Relation Age of Onset  . Diabetes Mother     Social History Social History   Tobacco Use  . Smoking status: Former Smoker    Packs/day: 0.00    Years: 10.00    Pack years: 0.00    Types: Cigarettes    Last attempt to quit: 10/20/2012    Years since quitting: 6.2  . Smokeless tobacco: Never Used  Substance Use Topics  . Alcohol use: No    Alcohol/week: 0.0 standard drinks  . Drug use: No     Allergies   Patient has no known allergies.   Review of Systems Review of Systems  Constitutional: Negative for appetite change.  HENT: Negative for congestion.   Respiratory: Positive for shortness of breath.   Cardiovascular: Negative for chest pain and leg swelling.  Gastrointestinal: Negative for abdominal pain.  Genitourinary: Negative for flank pain.  Musculoskeletal: Negative for back pain.  Skin: Negative for rash.  Neurological: Negative for weakness.  Psychiatric/Behavioral: The  patient is nervous/anxious.      Physical Exam Updated Vital Signs BP 104/62   Pulse 95   Temp (!) 97.4 F (36.3 C) (Axillary)   Resp (!) 28   Ht 6' (1.829 m)   Wt 75.4 kg   SpO2 100%   BMI 22.54 kg/m   Physical Exam Vitals signs and nursing note reviewed.  HENT:     Head: Normocephalic.  Cardiovascular:     Comments: Tachycardia Pulmonary:     Comments: Decreased breath sounds throughout. Chest:     Chest wall: No tenderness.  Musculoskeletal:     Right lower leg: He exhibits no tenderness.     Left lower leg: He exhibits no tenderness.  Skin:    General: Skin is warm.  Neurological:     Mental Status: He is alert.  ED Treatments / Results  Labs (all labs ordered are listed, but only abnormal results are displayed) Labs Reviewed  BASIC METABOLIC PANEL - Abnormal; Notable for the following components:      Result Value   Glucose, Bld 69 (*)    All other components within normal limits  CBC - Abnormal; Notable for the following components:   WBC 26.7 (*)    Hemoglobin 12.5 (*)    All other components within normal limits  BLOOD GAS, ARTERIAL - Abnormal; Notable for the following components:   Bicarbonate 29.5 (*)    Acid-Base Excess 6.1 (*)    All other components within normal limits  BASIC METABOLIC PANEL - Abnormal; Notable for the following components:   Glucose, Bld 66 (*)    All other components within normal limits  CBC - Abnormal; Notable for the following components:   WBC 24.3 (*)    RBC 3.77 (*)    Hemoglobin 10.1 (*)    HCT 34.0 (*)    MCHC 29.7 (*)    All other components within normal limits  GLUCOSE, CAPILLARY - Abnormal; Notable for the following components:   Glucose-Capillary 50 (*)    All other components within normal limits  GLUCOSE, CAPILLARY - Abnormal; Notable for the following components:   Glucose-Capillary 46 (*)    All other components within normal limits  GLUCOSE, CAPILLARY - Abnormal; Notable for the following  components:   Glucose-Capillary 187 (*)    All other components within normal limits  GLUCOSE, CAPILLARY - Abnormal; Notable for the following components:   Glucose-Capillary 203 (*)    All other components within normal limits  GLUCOSE, CAPILLARY - Abnormal; Notable for the following components:   Glucose-Capillary 235 (*)    All other components within normal limits  GLUCOSE, CAPILLARY - Abnormal; Notable for the following components:   Glucose-Capillary 222 (*)    All other components within normal limits  GLUCOSE, CAPILLARY - Abnormal; Notable for the following components:   Glucose-Capillary 216 (*)    All other components within normal limits  GLUCOSE, CAPILLARY - Abnormal; Notable for the following components:   Glucose-Capillary 175 (*)    All other components within normal limits  GLUCOSE, CAPILLARY - Abnormal; Notable for the following components:   Glucose-Capillary 164 (*)    All other components within normal limits  GLUCOSE, CAPILLARY - Abnormal; Notable for the following components:   Glucose-Capillary 159 (*)    All other components within normal limits  GLUCOSE, CAPILLARY - Abnormal; Notable for the following components:   Glucose-Capillary 207 (*)    All other components within normal limits  GLUCOSE, CAPILLARY - Abnormal; Notable for the following components:   Glucose-Capillary 194 (*)    All other components within normal limits  CBG MONITORING, ED - Abnormal; Notable for the following components:   Glucose-Capillary 56 (*)    All other components within normal limits  GLUCOSE, CAPILLARY  GLUCOSE, CAPILLARY  GLUCOSE, CAPILLARY  GLUCOSE, CAPILLARY  CBG MONITORING, ED  CBG MONITORING, ED  CBG MONITORING, ED    EKG EKG Interpretation  Date/Time:  Thursday February 01 2019 18:49:59 EDT Ventricular Rate:  120 PR Interval:    QRS Duration: 121 QT Interval:  317 QTC Calculation: 448 R Axis:   141 Text Interpretation:  Sinus tachycardia Ventricular  premature complex RBBB and LPFB Nonspecific T abnormalities, lateral leads Artifact in lead(s) I III aVR aVL V1 No significant change since last tracing Confirmed by Benjiman Core [phone]) on  02/01/2019 7:19:55 PM   Radiology Ct Chest Wo Contrast  Result Date: 02/01/2019 CLINICAL DATA:  63 year old male with respiratory failure, unresolved left upper lobe opacity since 01/09/2019. Negative for COVID-19 X3 over the course of this month. EXAM: CT CHEST WITHOUT CONTRAST TECHNIQUE: Multidetector CT imaging of the chest was performed following the standard protocol without IV contrast. COMPARISON:  Portable chest earlier today, and earlier. Chest CT 06/03/2011. FINDINGS: Cardiovascular: No cardiomegaly or pericardial effusion. Calcified coronary artery atherosclerosis and/or stents. Lesser Calcified aortic atherosclerosis. Vascular patency is not evaluated in the absence of IV contrast. Mediastinum/Nodes: Reactive appearing prevascular lymph nodes in the superior mediastinum are individually up to 8 millimeter short axis. Other mediastinal nodes are stable since 2012 and within normal limits. Hilar lymph nodes not well evaluated in the absence of contrast. Lungs/Pleura: Moderate to severe chronic centrilobular emphysema. Major airways are patent. Respiratory motion artifact most pronounced in the right lower lung. No acute right lung opacity other than perhaps mildly increased peribronchial thickening since 2012. Confluent abnormal in the anterior and posterior left upper lobe opacity in the form of consolidation and nodular septal thickening amidst severe emphysema. Possible cavitation demonstrated on series 4, image 37. Consolidation continues into the superior segment of the left lower lobe and also the medial basal segment of the left lower lobe with air bronchograms. No definite cavitation in the left lower lobe. Central left airways remain patent. Superimposed mild reticulonodular distal peribronchial  opacity in the lingula. No pleural effusion. Upper Abdomen: Negative visible noncontrast liver, spleen, pancreas, adrenal glands, kidneys, and bowel in the upper abdomen. Musculoskeletal: Osteopenia. No acute osseous abnormality identified. IMPRESSION: 1. Severe Emphysema (ICD10-J43.9) with superimposed Multilobar Left Lung Pneumonia and suspicion of cavitation/necrosis in the left upper lobe. Consolidated superior and medial basal segments of the left lower lobe. Possible early infection in the lingula. No pleural effusion. 2. Reactive appearing superior mediastinal lymph nodes. 3. No acute findings in the right lung. 4. Calcified coronary artery atherosclerosis. Electronically Signed   By: Odessa Fleming M.D.   On: 02/01/2019 23:29   Dg Chest Portable 1 View  Result Date: 02/01/2019 CLINICAL DATA:  63 year old male with respiratory failure, negative for COVID-19. X3 over the course of this month. EXAM: PORTABLE CHEST 1 VIEW COMPARISON:  01/26/2019 and earlier. FINDINGS: Portable AP upright view at 1912 hours. Right PICC line has been removed. Continued coarse and confluent opacity in the left upper lobe, progressed compared to 01/26/2019. Superimposed pulmonary hyperinflation and evidence of emphysema. No pneumothorax or pleural effusion. Normal cardiac size and mediastinal contours. Visualized tracheal air column is within normal limits. IMPRESSION: Chronic lung disease with unresolved confluent left upper lobe opacity which is mildly progressed since 01/26/2019. This was first demonstrated on 01/09/2019, and was new from 12/09/2018 arguing against neoplasm and favoring an infectious/inflammatory etiology. No pleural effusion or new cardiopulmonary abnormality. Electronically Signed   By: Odessa Fleming M.D.   On: 02/01/2019 19:30    Procedures Procedures (including critical care time)  Medications Ordered in ED Medications  rosuvastatin (CRESTOR) tablet 40 mg (40 mg Oral Given 02/02/19 2151)  ALPRAZolam (XANAX)  tablet 0.5 mg (0.5 mg Oral Given 02/02/19 2151)  predniSONE (DELTASONE) tablet 60 mg (60 mg Oral Given 02/02/19 0816)  pantoprazole (PROTONIX) EC tablet 40 mg (40 mg Oral Given 02/02/19 0900)  oxybutynin (DITROPAN) tablet 5 mg (5 mg Oral Given 02/02/19 2151)  multivitamin with minerals tablet 1 tablet (1 tablet Oral Given 02/02/19 0900)  albuterol (PROVENTIL) (2.5 MG/3ML) 0.083%  nebulizer solution 3 mL (has no administration in time range)  loratadine (CLARITIN) tablet 10 mg (10 mg Oral Given 02/02/19 0900)  fluticasone (FLONASE) 50 MCG/ACT nasal spray 2 spray (2 sprays Each Nare Given 02/02/19 0900)  guaiFENesin (MUCINEX) 12 hr tablet 1,200 mg (1,200 mg Oral Given 02/02/19 2151)  montelukast (SINGULAIR) tablet 10 mg (10 mg Oral Given 02/02/19 0900)  heparin injection 5,000 Units (5,000 Units Subcutaneous Given 02/03/19 0522)  acetaminophen (TYLENOL) tablet 650 mg (has no administration in time range)    Or  acetaminophen (TYLENOL) suppository 650 mg (has no administration in time range)  fluticasone furoate-vilanterol (BREO ELLIPTA) 100-25 MCG/INH 1 puff (1 puff Inhalation Not Given 02/02/19 1200)    And  umeclidinium bromide (INCRUSE ELLIPTA) 62.5 MCG/INH 1 puff (1 puff Inhalation Not Given 02/02/19 1200)  Chlorhexidine Gluconate Cloth 2 % PADS 6 each (6 each Topical Given 02/03/19 0522)  chlorhexidine (PERIDEX) 0.12 % solution 15 mL (15 mLs Mouth Rinse Given 02/02/19 2152)  MEDLINE mouth rinse (15 mLs Mouth Rinse Given 02/02/19 1600)  sodium chloride flush (NS) 0.9 % injection 10-40 mL (10 mLs Intracatheter Given 02/02/19 2153)  sodium chloride flush (NS) 0.9 % injection 10-40 mL (has no administration in time range)  insulin aspart (novoLOG) injection 0-9 Units (3 Units Subcutaneous Given 02/02/19 1703)  insulin aspart (novoLOG) injection 0-5 Units (0 Units Subcutaneous Not Given 02/02/19 2156)  ipratropium-albuterol (DUONEB) 0.5-2.5 (3) MG/3ML nebulizer solution 3 mL (3 mLs Inhalation Given 02/03/19 0355)   ipratropium-albuterol (DUONEB) 0.5-2.5 (3) MG/3ML nebulizer solution 3 mL (3 mLs Nebulization Given 02/01/19 2007)  glucagon (human recombinant) (GLUCAGEN) injection 1 mg (1 mg Intramuscular Given 02/01/19 2000)  dextrose 50 % solution 25 mL (25 mLs Intravenous Given 02/02/19 0645)  dextrose 50 % solution (  Duplicate 02/02/19 0721)     Initial Impression / Assessment and Plan / ED Course  I have reviewed the triage vital signs and the nursing notes.  Pertinent labs & imaging results that were available during my care of the patient were reviewed by me and considered in my medical decision making (see chart for details).  Clinical Course as of Feb 03 655  Thu Feb 01, 2019  2115 Normal except mild bicarb elevation  Blood gas, arterial(!) [EW]  2117 Normal except glucose low  Basic metabolic panel(!) [EW]  2117 Normal except white count high, hemoglobin low  CBC(!) [EW]  2118 Persistent left upper lobe infiltrate, possibly mildly progressed.  Image is reviewed by me.  DG Chest Portable 1 View [EW]    Clinical Course User Index [EW] Mancel Bale, MD       Patient with shortness of breath.  Just got out of the hospital around 3 hours before arrival.  Initially sounded somewhat panicky but while in the ER had sats that went to 70% on nasal cannula.  Improved with nonrebreather.  X-ray showed mildly worsening x-ray.  However no fevers.  White count is elevated with been on steroids.  Not currently on antibiotics.  While in the ER had hypoglycemia with sugar of 50.  Glucagon and orals given.  I think patient may likely require admission to hospital.  If possible we will get CT for pulmonary embolism, but patient is a difficult IV access.  Care turned over to Dr. Effie Shy.  Final Clinical Impressions(s) / ED Diagnoses   Final diagnoses:  Acute on chronic respiratory failure with hypoxia (HCC)  Hypoglycemia  Hypoxia    ED Discharge Orders    None  Benjiman Core, MD 02/01/19  Vassie Loll, MD 02/03/19 [phone]

## 2019-02-01 NOTE — H&P (Addendum)
TRH H&P   Patient Demographics:    Jeff Ray, is a 63 y.o. male  MRN: [id number]   DOB - 1955/12/06  Admit Date - 02/01/2019  Outpatient Primary MD for the patient is Gareth Morgan, MD  Referring MD/NP/PA: Dr Effie Shy  Outpatient Specialists: Pulm Dr Juanetta Gosling  Patient coming from: Home  Chief Complaint  Patient presents with  . Shortness of Breath      HPI:    Jeff Ray  is a 63 y.o. male, past medical history of end-stage COPD, recent multiple hospitalization and intubation at [facility] earlier this month, and another prolonged hospitalization here at any pain, patient was discharged home this afternoon secondary to acute on chronic hypoxic respiratory failure and COPD exacerbation, patient ports he was doing better when he was discharged home, then report he was anxious, then started to have shortness of breath, with increased work of breathing, which prompted him to call EMS, apparently saturation was 96% by EMS, patient denies fever, chills, chest pain, reports air hunger and dyspnea, denies cough or productive sputum. - in ED patient extremely anxious, tachypneic in the mid 30s, ABG showing no CO2 retention, he comes hypoxic nasal cannula and he is mainly in a mouth breather, and this does resolve with BiPAP, he is extremely anxious, and does not want his BiPAP mask to be removed, chest x-ray showing chronic left upper lung opacity, no significant labs abnormalities, I was called to admit.    Review of systems:    In addition to the HPI above,  No Fever-chills, No Headache, No changes with Vision or hearing, No problems swallowing food or Liquids, No Chest pain, Cough he reports  Shortness of Breath, No Abdominal pain, No Nausea or Vommitting, Bowel movements are regular, No Blood in stool or Urine, No dysuria, No new skin rashes or bruises, No new joints  pains-aches,  No new weakness, tingling, numbness in any extremity, No recent weight gain or loss, No polyuria, polydypsia or polyphagia, No significant Mental Stressors.  A full 10 point Review of Systems was done, except as stated above, all other Review of Systems were negative.   With Past History of the following :    Past Medical History:  Diagnosis Date  . Anxiety   . Asthma   . COPD (chronic obstructive pulmonary disease) (HCC)   . Diabetes mellitus   . History of nuclear stress test 02/2016   low risk study  . Hypertension   . On home O2    2L N/C   . Pneumonia       Past Surgical History:  Procedure Laterality Date  . BACK SURGERY        Social History:     Social History   Tobacco Use  . Smoking status: Former Smoker    Packs/day: 0.00    Years: 10.00    Pack years: 0.00  Types: Cigarettes    Last attempt to quit: 10/20/2012    Years since quitting: 6.2  . Smokeless tobacco: Never Used  Substance Use Topics  . Alcohol use: No    Alcohol/week: 0.0 standard drinks     Lives - at home  Mobility - with cane and mobile wheel chair     Family History :     Family History  Problem Relation Age of Onset  . Diabetes Mother      Home Medications:   Prior to Admission medications   Medication Sig Start Date End Date Taking? Authorizing Provider  albuterol (PROAIR HFA) 108 (90 BASE) MCG/ACT inhaler Inhale 2 puffs into the lungs every 6 (six) hours as needed for wheezing or shortness of breath. Shortness of Breath    [provider]  ALPRAZolam (XANAX) 1 MG tablet Take 0.5 tablets (0.5 mg total) by mouth 2 (two) times daily as needed for anxiety. 01/19/19   Zannie Cove, MD  fexofenadine-pseudoephedrine (ALLEGRA-D 24) 180-240 MG 24 hr tablet Take 1 tablet by mouth daily.    [provider]  fluticasone (FLONASE) 50 MCG/ACT nasal spray Place 2 sprays into both nostrils daily. 09/30/17   [provider]  glimepiride  (AMARYL) 2 MG tablet Take 2 mg by mouth daily with breakfast.    [provider]  Guaifenesin (MUCINEX MAXIMUM STRENGTH) 1200 MG TB12 Take 1 tablet (1,200 mg total) by mouth 2 (two) times daily. 12/11/18   Shon Hale, MD  Insulin Glargine (LANTUS) 100 UNIT/ML Solostar Pen Inject 30 Units into the skin daily. 01/19/19   Zannie Cove, MD  ipratropium-albuterol (DUONEB) 0.5-2.5 (3) MG/3ML SOLN Take 3 mLs by nebulization every 4 (four) hours as needed. For shortness of breath    [provider]  metFORMIN (GLUCOPHAGE-XR) 500 MG 24 hr tablet Take 2 tablets (1,000 mg total) by mouth 2 (two) times daily. 02/01/19   Johnson, Clanford L, MD  montelukast (SINGULAIR) 10 MG tablet Take 10 mg by mouth every morning.     [provider]  Multiple Vitamins-Minerals (CENTRUM SILVER 50+MEN PO) Take 1 tablet by mouth daily.    [provider]  oxybutynin (DITROPAN) 5 MG tablet Take 1 tablet (5 mg total) by mouth 2 (two) times daily. 01/19/19   Zannie Cove, MD  pantoprazole (PROTONIX) 40 MG tablet Take 40 mg by mouth every morning.     [provider]  predniSONE (DELTASONE) 20 MG tablet Take 3 PO QAM x5days, 2 PO QAM x5days, 1 PO QAM x5days 02/01/19   Johnson, Clanford L, MD  rosuvastatin (CRESTOR) 40 MG tablet Take 40 mg by mouth at bedtime.  07/24/17   [provider]  TRELEGY ELLIPTA 100-62.5-25 MCG/INH AEPB Take 2 puffs by mouth daily. 11/17/18   [provider]     Allergies:    No Known Allergies   Physical Exam:   Vitals  Blood pressure 117/64, pulse (!) 111, temperature 97.7 F (36.5 C), temperature source Oral, resp. rate (!) 39, height 6' (1.829 m), weight 73.1 kg, SpO2 100 %.   1. General frail, chronically ill-appearing male, extremely anxious, laying in bed anxious, tachypneic  2.  Anxious, normal insight, Not Suicidal or Homicidal, Awake Alert, Oriented X 3.  3. No F.N deficits, ALL C.Nerves Intact, Strength 5/5 all 4  extremities, Sensation intact all 4 extremities, Plantars down going.  4. Ears and Eyes appear Normal, Conjunctivae clear, PERRLA. Moist Oral Mucosa.  5. Supple Neck, No JVD, No cervical lymphadenopathy appriciated,  No Carotid Bruits.  6. Symmetrical Chest wall movement, Good air movement bilaterally, minimal scattered wheezing, tachypneic with some use of accessory muscles  7.  Tachycardic, No Gallops, Rubs or Murmurs, No Parasternal Heave.  8. Positive Bowel Sounds, Abdomen Soft, No tenderness, No organomegaly appriciated,No rebound -guarding or rigidity.  9.  No Cyanosis, Normal Skin Turgor, No Skin Rash or Bruise.  10. Good muscle tone,  joints appear normal , no effusions, Normal ROM.  11. No Palpable Lymph Nodes in Neck or Axillae     Data Review:    CBC Recent Labs  Lab 01/26/19 0542 01/27/19 0412 01/28/19 0640 01/29/19 0513 01/30/19 0410 02/01/19 1937  WBC 32.2* 30.6* 25.1* 24.5* 25.5* 26.7*  HGB 10.6* 9.5* 9.3* 9.3* 8.8* 12.5*  HCT 35.3* 31.0* 29.7* 30.6* 29.3* 41.7  PLT 233 260 286 285 249 242  MCV 88.5 87.6 86.3 87.9 88.5 89.7  MCH 26.6 26.8 27.0 26.7 26.6 26.9  MCHC 30.0 30.6 31.3 30.4 30.0 30.0  RDW 14.5 14.4 14.6 14.5 14.6 15.2  LYMPHSABS 0.4* 0.5* 0.4* 0.5* 0.4*  --   MONOABS 0.3 0.4 0.5 0.6 0.6  --   EOSABS 0.0 0.0 0.0 0.0 0.0  --   BASOSABS 0.1 0.0 0.0 0.0 0.0  --    ------------------------------------------------------------------------------------------------------------------  Chemistries  Recent Labs  Lab 01/26/19 0542 01/27/19 0412 01/28/19 0640 01/29/19 0513 01/30/19 0410 02/01/19 1937  NA 133* 133* 133* 137 136 139  K 4.4 4.4 4.3 4.0 4.1 4.2  CL 98 98 98 102 101 98  CO2 26 29 30 29 28 31   GLUCOSE 252* 243* 301* 98 158* 69*  BUN 16 18 24* 23 24* 22  CREATININE 0.73 0.79 0.90 0.79 0.74 1.01  CALCIUM 8.9 9.2 9.1 9.3 9.2 9.8  MG 2.1 2.1  --   --   --   --   AST 12* 15 17 16 17   --   ALT 30 26 32 30 33  --   ALKPHOS 133* 121 122  116 105  --   BILITOT 0.5 0.1* 0.3 0.3 0.3  --    ------------------------------------------------------------------------------------------------------------------ estimated creatinine clearance is 76.4 mL/min (by C-G formula based on SCr of 1.01 mg/dL). ------------------------------------------------------------------------------------------------------------------ No results for input(s): TSH, T4TOTAL, T3FREE, THYROIDAB in the last 72 hours.  Invalid input(s): FREET3  Coagulation profile No results for input(s): INR, PROTIME in the last 168 hours. ------------------------------------------------------------------------------------------------------------------- No results for input(s): DDIMER in the last 72 hours. -------------------------------------------------------------------------------------------------------------------  Cardiac Enzymes No results for input(s): CKMB, TROPONINI, MYOGLOBIN in the last 168 hours.  Invalid input(s): CK ------------------------------------------------------------------------------------------------------------------    Component Value Date/Time   BNP 130.0 (H) 01/29/2019 0513     ---------------------------------------------------------------------------------------------------------------  Urinalysis    Component Value Date/Time   COLORURINE YELLOW 01/25/2019 0743   APPEARANCEUR HAZY (A) 01/25/2019 0743   LABSPEC 1.027 01/25/2019 0743   PHURINE 7.0 01/25/2019 0743   GLUCOSEU 150 (A) 01/25/2019 0743   HGBUR NEGATIVE 01/25/2019 0743   BILIRUBINUR NEGATIVE 01/25/2019 0743   KETONESUR NEGATIVE 01/25/2019 0743   PROTEINUR 30 (A) 01/25/2019 0743   UROBILINOGEN 0.2 02/08/2008 1731   NITRITE NEGATIVE 01/25/2019 0743   LEUKOCYTESUR NEGATIVE 01/25/2019 0743    ----------------------------------------------------------------------------------------------------------------   Imaging Results:    Dg Chest Portable 1 View  Result Date:  02/01/2019 CLINICAL DATA:  63 year old male with respiratory failure, negative for COVID-19. X3 over the course of this month. EXAM: PORTABLE CHEST 1 VIEW COMPARISON:  01/26/2019 and earlier. FINDINGS: Portable AP upright view at 1912  hours. Right PICC line has been removed. Continued coarse and confluent opacity in the left upper lobe, progressed compared to 01/26/2019. Superimposed pulmonary hyperinflation and evidence of emphysema. No pneumothorax or pleural effusion. Normal cardiac size and mediastinal contours. Visualized tracheal air column is within normal limits. IMPRESSION: Chronic lung disease with unresolved confluent left upper lobe opacity which is mildly progressed since 01/26/2019. This was first demonstrated on 01/09/2019, and was new from 12/09/2018 arguing against neoplasm and favoring an infectious/inflammatory etiology. No pleural effusion or new cardiopulmonary abnormality. Electronically Signed   By: Odessa Fleming M.D.   On: 02/01/2019 19:30    My personal review of EKG: Rhythm NSR, Rate  120 /min, QTc 448 , no Acute ST changes   Assessment & Plan:    Active Problems:   COPD exacerbation (HCC)   DM (diabetes mellitus) (HCC)   GERD (gastroesophageal reflux disease)   Anxiety   On home O2   Acute on chronic respiratory failure (HCC)    Acute on Chronic respiratory failure with hypoxia -Patient with known history of advanced COPD, with recent prolonged hospital stay, he was discharged home earlier today, with recommendation to continue his home oxygen, and he was supposed to have BiPAP delivered to his house by tomorrow, he became hypoxic, tachypneic so he came back to ED. -Patient extremely anxious in ED, became hypoxic on nasal cannula as he is mainly breathing through his mouth, (actually he became hypoxic 82% on 3 L nasal cannula which did resolve after initiation of BiPAP ) this did resolve on BiPAP, so he will will continue BiPAP overnight, he can be discharged tomorrow once  BiPAP delivered to his house, he has minimal wheezing, no indication for IV steroids, continue with home dose prednisone. -We will obtain CT chest for further evaluation left upper lobe opacity on imaging  COPD exacerbation -Treated with Pulmicort and Brovana, continue with home dose prednisone, will start on Combivent  Type 2 diabetes mellitus with hyperglycemia -I have lowered his Lantus from 30 to 15 units to start tomorrow, we will keep him on CBG every 2 hours to prevent further hypoglycemia, hold Amaryl and metformin -Glycemia most likely as he received his morning Lantus without eating  Hyperlipidemia -Continue with statin therapy  Anxiety disorder -Continue with his home meds,  DVT Prophylaxis   Lovenox - SCDs  AM Labs Ordered, also please review Full Orders  Family Communication: Admission, patients condition and plan of care including tests being ordered have been discussed with the patient who indicate understanding and agree with the plan and Code Status.  Code Status Full  Likely DC to  Home  Condition GUARDED    Consults called: none  Admission status: observation   Time spent in minutes : 60 minutes   Huey Bienenstock M.D on 02/01/2019 at 9:58 PM  Between 7am to 7pm - Pager - [phone]. After 7pm go to [email or web address] - password [facility]  [facility] - Office  [phone]

## 2019-02-01 NOTE — Progress Notes (Signed)
Merdis Delay, NP paged to get PICC line order. Pt has #22 to R hand that is barely in (catheter is exposed almost 1 inch), having hypoglycemia and receiving IVF. Pt had PICC line this past admit d/t being a hard stick. Waiting for call back/orders. Will continue to monitor pt

## 2019-02-01 NOTE — Progress Notes (Signed)
Per home health services, pt is to go home with condom catheter. Home health has ordered for supplies for pt to have at home. Pt given discharge instructions. Pt verbalizes understanding of discharge instructions. PICC line removed without complications and covered with gauze and tegaderm dressing.

## 2019-02-01 NOTE — ED Notes (Signed)
Patient transported to CT 

## 2019-02-01 NOTE — Discharge Instructions (Signed)
Acute Respiratory Distress Syndrome, Adult ° °Acute respiratory distress syndrome is a life-threatening condition in which fluid collects in the lungs. This prevents the lungs from filling with air and passing oxygen into the blood. This can cause the lungs and other vital organs to fail. The condition usually develops following an infection, illness, surgery, or injury. °What are the causes? °This condition may be caused by: °· An infection, such as sepsis or pneumonia. °· A serious injury to the head or chest. °· Severe bleeding from an injury. °· A major surgery. °· Breathing in harmful chemicals or smoke. °· Blood transfusions. °· A blood clot in the lungs. °· Breathing in vomit (aspiration). °· Near-drowning. °· Inflammation of the pancreas (pancreatitis). °· A drug overdose. °What are the signs or symptoms? °Sudden shortness of breath and rapid breathing are the main symptoms of this condition. Other symptoms may include: °· A fast or irregular heartbeat. °· Skin, lips, or fingernails that look blue (cyanosis). °· Confusion. °· Tiredness or loss of energy. °· Chest pain, particularly while taking a breath. °· Coughing. °· Restlessness or anxiety. °· Fever. This is usually present if there is an underlying infection, such as pneumonia. °How is this diagnosed? °This condition is diagnosed based on: °· Your symptoms. °· Medical history. °· A physical exam. During the exam, your health care provider will listen to your heart and check for crackling or wheezing sounds in your lungs. °You may also have other tests to confirm the diagnosis and measure how well your lungs are working. These may include: °· Measuring the amount of oxygen in your blood. Your health care provider will use two methods to do this procedure: °? A small device (pulse oximeter) that is placed on your finger, earlobe, or toe. °? An arterial blood gas test. A sample of blood is taken from an artery and tested for oxygen levels. °· Blood  tests. °· Chest X-rays or CT scans to look for fluid in the lungs. °· Taking a sample of your sputum to test for infection. °· Heart test, such as an echocardiogram or electrocardiogram. This is done to rule out any heart problems (such as heart failure) that may be causing your symptoms. °· Bronchoscopy. During this test, a thin, flexible tube with a light is passed into the mouth or nose, down the windpipe, and into the lungs. °How is this treated? °Treatment depends on the cause of your condition. The goal is to support you while your lungs heal and the underlying cause is treated. Treatment may include: °· Oxygen therapy. This may be done through: °? A tube in your nose or a face mask. °? A ventilator. This device helps move air into and out of your lungs through a breathing tube that is inserted into your mouth or nose. °· Continuous positive airway pressure (CPAP). This treatment uses mild air pressure to keep the airways open. A mask or other device will be placed over your nose or mouth. °· Tracheostomy. During this procedure, a small cut is made in your neck to create an opening to your windpipe. A breathing tube is placed directly into your windpipe. The breathing tube is connected to a ventilator. This is done if you have problems with your airway or if you need a ventilator for a long period of time. °· Positioning you to lie on your stomach (prone position). °· Medicines, such as: °? Sedatives to help you relax. °? Blood pressure medicines. °? Antibiotics to treat infection. °? Blood   thinners to prevent blood clots. ? Diuretics to help prevent excess fluid.  Fluids and nutrients given through an IV tube.  Wearing compression stockings on your legs to prevent blood clots.  Extra corporeal membrane oxygenation (ECMO). This treatment takes blood outside your body, adds oxygen, and removes carbon dioxide. The blood is then returned to your body. This treatment is only used in severe cases. Follow  these instructions at home:  Take over-the-counter and prescription medicines only as told by your health care provider.  Do not use any products that contain nicotine or tobacco, such as cigarettes and e-cigarettes. If you need help quitting, ask your health care provider.  Limit alcohol intake to no more than 1 drink per day for nonpregnant women and 2 drinks per day for men. One drink equals 12 oz of beer, 5 oz of wine, or 1 oz of hard liquor.  Ask friends and family to help you if daily activities make you tired.  Attend any pulmonary rehabilitation as told by your health care provider. This may include: ? Education about your condition. ? Exercises. ? Breathing training. ? Counseling. ? Learning techniques to conserve energy. ? Nutrition counseling.  Keep all follow-up visits as told by your health care provider. This is important. Contact a health care provider if:  You become short of breath during activity or while resting.  You develop a cough that does not go away.  You have a fever.  Your symptoms do not get better or they get worse.  You become anxious or depressed. Get help right away if:  You have sudden shortness of breath.  You develop sudden chest pain that does not go away.  You develop a rapid heart rate.  You develop swelling or pain in one of your legs.  You cough up blood.  You have trouble breathing.  Your skin, lips, or fingernails turn blue. These symptoms may represent a serious problem that is an emergency. Do not wait to see if the symptoms will go away. Get medical help right away. Call your local emergency services (911 in the [place] Do not drive yourself to the hospital. Summary  Acute respiratory distress syndrome is a life-threatening condition in which fluid collects in the lungs, which leads the lungs and other vital organs to fail.  This condition usually develops following an infection, illness, surgery, or injury.  Sudden  shortness of breath and rapid breathing are the main symptoms of acute respiratory distress syndrome.  Treatment may include oxygen therapy, continuous positive airway pressure (CPAP), tracheostomy, lying on your stomach (prone position), medicines, fluids and nutrients given through an IV tube, compression stockings, and extra corporeal membrane oxygenation (ECMO). This information is not intended to replace advice given to you by your health care provider. Make sure you discuss any questions you have with your health care provider. Document Released: 09/20/2005 Document Revised: 09/06/2016 Document Reviewed: 09/06/2016 Elsevier Interactive Patient Education  2019 [employer]   Healthcare-Associated Pneumonia  Healthcare-associated pneumonia is a lung infection that a person can get when in a health care setting or during certain procedures. The infection causes air sacs inside the lungs to fill with pus or fluid. Healthcare-associated pneumonia is usually caused by bacteria that are common in health care settings. These bacteria may be resistant to some antibiotic medicines. What are the causes? This condition is caused by bacteria that get into your lungs. You can get this condition if you:  Breathe in droplets from an infected person's cough  or sneeze.  Touch something that an infected person coughed or sneezed on and then touch your mouth, nose, or eyes.  Have a bacterial infection somewhere else in your body, if the bacteria spread to your lungs through your blood. What increases the risk? This condition is more likely to develop in people who:  Have a disease that weakens their body's defense system (immune system) or their ability to cough out germs.  Are older than age 64.  Having trouble swallowing.  Use a feeding or breathing tube.  Have a cold or the flu.  Have an IV tube inserted in a vein.  Have surgery.  Have a bed sore.  Live in a long-term care facility,  such as a nursing home.  Were in the hospital for two or more days in the past 3 months.  Received hemodialysis in the past 30 days. What are the signs or symptoms? Symptoms of this condition include:  Fever.  Chills.  Cough.  Shortness of breath.  Wheezing or crackling sounds when breathing. How is this diagnosed? This condition may be diagnosed based on:  Your symptoms.  A chest X-ray.  A measurement of the amount of oxygen in your blood. How is this treated? This condition is treated with antibiotics. Your health care provider may take a sample of cells (culture) from your throat to determine what type of bacteria is in your lungs and change your antibiotic based on the results. If you have bacteria in your blood, trouble breathing, or a low oxygen level, you may need to be treated at the hospital. At the hospital, you will be given antibiotics through an IV tube. You may also be given oxygen or breathing treatments. Follow these instructions at home: Medicine  Take your antibiotic medicine as told by your health care provider. Do not stop taking the antibiotic even if you start to feel better.  Take over-the-counter and other prescription medicines only as told by your health care provider. Activity  Rest at home until you feel better.  Return to your normal activities as told by your health care provider. Ask your health care provider what activities are safe for you. General instructions   Drink enough fluid to keep your urine clear or pale yellow.  Do not use any products that contain nicotine or tobacco, such as cigarettes and e-cigarettes. If you need help quitting, ask your health care provider.  Limit alcohol intake to no more than 1 drink per day for nonpregnant women and 2 drinks per day for men. One drink equals 12 oz of beer, 5 oz of wine, or 1 oz of hard liquor.  Keep all follow-up visits as told by your health care provider. This is important. How is  this prevented? Actions that I can take To lower your risk of getting this condition again:  Do not smoke. This includes e-cigarettes.  Do not drink too much alcohol.  Keep your immune system healthy by eating well and getting enough sleep.  Get a flu shot every year (annually).  Get a pneumonia vaccination if: ? You are older than age 33. ? You smoke. ? You have a long-lasting condition like lung disease.  Exercise your lungs by taking deep breaths, walking, and using an incentive spirometer as directed.  Wash your hands often with soap and water. If you cannot get to a sink to wash your hands, use an alcohol-based hand cleaner.  Make sure your health care providers are washing their hands. If  you do not see them wash their hands, ask them to do so.  When you are in a health care facility, avoid touching your eyes, nose, and mouth.  Avoid touching any surface near where people have coughed or sneezed.  Stand away from sick people when they are coughing or sneezing.  Wear a mask if you cannot avoid exposure to people who are sick.  Clean all surfaces often with a disinfectant cleaner, especially if someone is sick at home or work.  Precautions of my health care team Hospitals, nursing homes, and other health care facilities take special care to try to prevent healthcare-associated pneumonia. To do this, your health care team may:  Clean their hands with soap and water or with alcohol-based hand sanitizer before and after seeing patients.  Wear gloves or masks during treatment.  Sanitize medical instruments, tubes, other equipment, and surfaces in patient rooms.  Raise (elevate) the head of your hospital bed so you are not lying flat. The head of the bed may be elevated 30 degrees or more.  Have you sit up and move around as soon as possible after surgery.  Only insert a breathing tube if needed.  Do these things for you if you have a breathing tube: ? Clean the inside  of your mouth regularly. ? Remove the breathing tube as soon as it is no longer needed. Contact a health care provider if:  Your symptoms do not get better or they get worse.  Your symptoms come back after you have finished taking your antibiotics. Get help right away if:  You have trouble breathing.  You have confusion or difficulty thinking. This information is not intended to replace advice given to you by your health care provider. Make sure you discuss any questions you have with your health care provider. Document Released: 02/10/2016 Document Revised: 07/06/2016 Document Reviewed: 06/18/2016 Elsevier Interactive Patient Education  2019 [employer]  CPAP and BPAP Information CPAP and BPAP are methods of helping a person breathe with the use of air pressure. CPAP stands for "continuous positive airway pressure." BPAP stands for "bi-level positive airway pressure." In both methods, air is blown through your nose or mouth and into your air passages to help you breathe well. CPAP and BPAP use different amounts of pressure to blow air. With CPAP, the amount of pressure stays the same while you breathe in and out. With BPAP, the amount of pressure is increased when you breathe in (inhale) so that you can take larger breaths. Your health care provider will recommend whether CPAP or BPAP would be more helpful for you. Why are CPAP and BPAP treatments used? CPAP or BPAP can be helpful if you have:  Sleep apnea.  Chronic obstructive pulmonary disease (COPD).  Heart failure.  Medical conditions that weaken the muscles of the chest including muscular dystrophy, or neurological diseases such as amyotrophic lateral sclerosis (ALS).  Other problems that cause breathing to be weak, abnormal, or difficult. CPAP is most commonly used for obstructive sleep apnea (OSA) to keep the airways from collapsing when the muscles relax during sleep. How is CPAP or BPAP administered? Both CPAP and BPAP  are provided by a small machine with a flexible plastic tube that attaches to a plastic mask. You wear the mask. Air is blown through the mask into your nose or mouth. The amount of pressure that is used to blow the air can be adjusted on the machine. Your health care provider will determine the pressure setting  that should be used based on your individual needs. When should CPAP or BPAP be used? In most cases, the mask only needs to be worn during sleep. Generally, the mask needs to be worn throughout the night and during any daytime naps. People with certain medical conditions may also need to wear the mask at other times when they are awake. Follow instructions from your health care provider about when to use the machine. What are some tips for using the mask?   Because the mask needs to be snug, some people feel trapped or closed-in (claustrophobic) when first using the mask. If you feel this way, you may need to get used to the mask. One way to do this is by holding the mask loosely over your nose or mouth and then gradually applying the mask more snugly. You can also gradually increase the amount of time that you use the mask.  Masks are available in various types and sizes. Some fit over your mouth and nose while others fit over just your nose. If your mask does not fit well, talk with your health care provider about getting a different one.  If you are using a mask that fits over your nose and you tend to breathe through your mouth, a chin strap may be applied to help keep your mouth closed.  The CPAP and BPAP machines have alarms that may sound if the mask comes off or develops a leak.  If you have trouble with the mask, it is very important that you talk with your health care provider about finding a way to make the mask easier to tolerate. Do not stop using the mask. Stopping the use of the mask could have a negative impact on your health. What are some tips for using the machine?  Place  your CPAP or BPAP machine on a secure table or stand near an electrical outlet.  Know where the on/off switch is located on the machine.  Follow instructions from your health care provider about how to set the pressure on your machine and when you should use it.  Do not eat or drink while the CPAP or BPAP machine is on. Food or fluids could get pushed into your lungs by the pressure of the CPAP or BPAP.  Do not smoke. Tobacco smoke residue can damage the machine.  For home use, CPAP and BPAP machines can be rented or purchased through home health care companies. Many different brands of machines are available. Renting a machine before purchasing may help you find out which particular machine works well for you.  Keep the CPAP or BPAP machine and attachments clean. Ask your health care provider for specific instructions. Get help right away if:  You have redness or open areas around your nose or mouth where the mask fits.  You have trouble using the CPAP or BPAP machine.  You cannot tolerate wearing the CPAP or BPAP mask.  You have pain, discomfort, and bloating in your abdomen. Summary  CPAP and BPAP are methods of helping a person breathe with the use of air pressure.  Both CPAP and BPAP are provided by a small machine with a flexible plastic tube that attaches to a plastic mask.  If you have trouble with the mask, it is very important that you talk with your health care provider about finding a way to make the mask easier to tolerate. This information is not intended to replace advice given to you by your health care provider.  Make sure you discuss any questions you have with your health care provider. Document Released: 06/18/2004 Document Revised: 05/23/2018 Document Reviewed: 08/09/2016 Elsevier Interactive Patient Education  2019 [employer]    IMPORTANT INFORMATION: PAY CLOSE ATTENTION   PHYSICIAN DISCHARGE INSTRUCTIONS  Follow with Primary care provider  Gareth Morgan, MD  and other consultants as instructed your Hospitalist Physician  SEEK MEDICAL CARE OR RETURN TO EMERGENCY ROOM IF SYMPTOMS COME BACK, WORSEN OR NEW PROBLEM DEVELOPS.   Please note: You were cared for by a hospitalist during your hospital stay. Every effort will be made to forward records to your primary care provider.  You can request that your primary care provider send for your hospital records if they have not received them.  Once you are discharged, your primary care physician will handle any further medical issues. Please note that NO REFILLS for any discharge medications will be authorized once you are discharged, as it is imperative that you return to your primary care physician (or establish a relationship with a primary care physician if you do not have one) for your post hospital discharge needs so that they can reassess your need for medications and monitor your lab values.  Please get a complete blood count and chemistry panel checked by your Primary MD at your next visit, and again as instructed by your Primary MD.  Get Medicines reviewed and adjusted: Please take all your medications with you for your next visit with your Primary MD  Laboratory/radiological data: Please request your Primary MD to go over all hospital tests and procedure/radiological results at the follow up, please ask your primary care provider to get all Hospital records sent to his/her office.  In some cases, they will be blood work, cultures and biopsy results pending at the time of your discharge. Please request that your primary care provider follow up on these results.  If you are diabetic, please bring your blood sugar readings with you to your follow up appointment with primary care.    Please call and make your follow up appointments as soon as possible.    Also Note the following: If you experience worsening of your admission symptoms, develop shortness of breath, life threatening emergency,  suicidal or homicidal thoughts you must seek medical attention immediately by calling 911 or calling your MD immediately  if symptoms less severe.  You must read complete instructions/literature along with all the possible adverse reactions/side effects for all the Medicines you take and that have been prescribed to you. Take any new Medicines after you have completely understood and accpet all the possible adverse reactions/side effects.   Do not drive when taking Pain medications or sleeping medications (Benzodiazepines)  Do not take more than prescribed Pain, Sleep and Anxiety Medications. It is not advisable to combine anxiety,sleep and pain medications without talking with your primary care practitioner  Special Instructions: If you have smoked or chewed Tobacco  in the last 2 yrs please stop smoking, stop any regular Alcohol  and or any Recreational drug use.  Wear Seat belts while driving.

## 2019-02-01 NOTE — ED Triage Notes (Signed)
Pt was just discharged from [facility] approximately 3 hours ago. Pt reports he felt much better when he was sent home earlier. When he arrived home his wife was making him very anxious and he starting panicking and having SOB. EMS was called by his wife per pt's request. EMS reported as pt was removed from the scene and the atmosphere became calm, pt reported he was feeling a lot better. Pt denies SOB at this time. Pt calm, cooperative, even, unlabored breathing.

## 2019-02-01 NOTE — Discharge Summary (Addendum)
Physician Discharge Summary  Jeff Ray ZOX:[id number] DOB: [birth year] DOA: 01/25/2019  PCP: Gareth Morgan, MD Pulmonary: Juanetta Gosling  Admit date: 01/25/2019 Discharge date: 02/01/2019  Admitted From:  Home  Disposition: Home   Recommendations for Outpatient Follow-up:  1. Follow up with PCP in 1 weeks 2. Follow up with pulmonary in 1-2 weeks  3. Please check CBC and BMP in 1-2 weeks to follow up  Home Health: RN, Respiratory Care  Discharge Condition: STABLE   CODE STATUS: FULL    Brief Hospitalization Summary: Please see all hospital notes, images, labs for full details of the hospitalization. HPI: Jeff Ray is a 63 y.o. male smoker with home oxygen dependent COPD was recently discharged from [facility] from a prolonged hospitalization for respiratory failure requiring intubation in the ICU, extubated on 01/10/2019 and discharged 01/12/2019 on a prednisone taper.  He was diagnosed with a pneumonia at that time and was in diabetic ketoacidosis with a hemoglobin A1c near 12%.  His coronavirus testing was negative at the time.  He showed improvement on IV steroids and bronchodilators and was eventually weaned down to 3 L of oxygen and discharged home on a prednisone taper with home health PT OT and RN.  He apparently went home and has had progressive shortness of breath.  He was brought into the hospital by EMS for symptoms of chest pain and shortness of breath.  He was noted to have a pulse ox of 84% on 4 L/min.  He complained of severe shortness of breath.  He was placed on a nonrebreather by EMS.  When he arrived in the emergency department he subsequently placed on high flow nasal cannula oxygen and subsequently placed on BiPAP.  He had repeat COVID-19 testing done that was again negative.  He was started on broad-spectrum antibiotics for presumed nosocomial pneumonia.  His lactic acid was elevated at 2.3.  He was bolused IV fluids.  His WBC was markedly elevated at 30.7 however he  had been on a steroid taper.  He was noted to have a left upper lobe pneumonia on chest x-ray and fibrotic changes were seen.  His ABG revealed a pH of 7.452, PCO2 48.9, PO2 59.7.  His blood glucose was 161.  Sodium was 133.  Potassium was 3.9.  His anion gap was 14.  The patient is being admitted to the ICU for high risk of decompensation and possible intubation.  The patient is critically ill at this time.  Brief Admission Hx: 63 year old male smoker with severe end-stage home oxygen dependent COPD who was recently discharged from a long hospitalization at [facility] presented with progressive shortness of breath and respiratory failure.  He was diagnosed with pneumonia.  MDM/Assessment & Plan:   1. Acute on chronic respiratory failure with hypoxia and hypercapnia-he has improved with treatments.  He would benefit from BiPAP therapy at home.  Dr. Juanetta Gosling with pulmonology has followed him.  Pt says that a bipap machine was donated to him by a family member that recently expired and patient now has access to the bipap machine and equipment which he was encouraged to use.  He was encouraged to follow up with Dr. Juanetta Gosling for outpatient pulmonary care.   2. COPD exacerbation- the patient was treated with Pulmicort and Brovana, IV steroids and nebulizer treatments.  He completed a full course of antibiotics. 3. Type 2 diabetes mellitus poorly controlled with hyperglycemia as evidenced by hemoglobin A1c greater than 11%.  resume home treatment regimen.  Continue  to monitor CBGs.   4. Hypoglycemia - likely from small amount of carbs eaten during meal and receiving full meal coverage and SSI.  This has been corrected and his insulin doses adjusted.   5. Hyperlipidemia-continue statin therapy. 6. Tobacco abuse- the patient reports that he quit all tobacco use 6 years ago. 7. Generalized anxiety disorder- he is on alprazolam for anxiety which seems to control his symptoms.  DVT prophylaxis: Lovenox Code  Status: Full Family Communication: Spoke with sister by telephone for update Disposition Plan: Home   Consultants:  pulmonology  Procedures:    Antimicrobials:  Meropenem 4/23-4/24  Doxycycline 4/24-4/28  Discharge Diagnoses:  Principal Problem:   Acute and chronic respiratory failure (acute-on-chronic) (HCC) Active Problems:   Acute on chronic respiratory failure with hypercapnia (HCC)   COPD exacerbation (HCC)   HTN (hypertension)   DM (diabetes mellitus) (HCC)   Tobacco abuse   HLD (hyperlipidemia)   GERD (gastroesophageal reflux disease)   Anxiety   Chronic respiratory failure with hypoxia (HCC)   Uncontrolled type 2 diabetes mellitus with hyperglycemia (HCC)   COPD with acute exacerbation (HCC)   HCAP (healthcare-associated pneumonia)   On home O2   Elevated lactic acid level   Discharge Instructions: Discharge Instructions    Call MD for:  extreme fatigue   Complete by:  As directed    Call MD for:  persistant dizziness or light-headedness   Complete by:  As directed    Increase activity slowly   Complete by:  As directed      Allergies as of 02/01/2019   No Known Allergies     Medication List    TAKE these medications   ALPRAZolam 1 MG tablet Commonly known as:  XANAX Take 0.5 tablets (0.5 mg total) by mouth 2 (two) times daily as needed for anxiety.   CENTRUM SILVER 50+MEN PO Take 1 tablet by mouth daily.   fexofenadine-pseudoephedrine 180-240 MG 24 hr tablet Commonly known as:  ALLEGRA-D 24 Take 1 tablet by mouth daily.   fluticasone 50 MCG/ACT nasal spray Commonly known as:  FLONASE Place 2 sprays into both nostrils daily.   glimepiride 2 MG tablet Commonly known as:  AMARYL Take 2 mg by mouth daily with breakfast.   Guaifenesin 1200 MG Tb12 Commonly known as:  Mucinex Maximum Strength Take 1 tablet (1,200 mg total) by mouth 2 (two) times daily.   Insulin Glargine 100 UNIT/ML Solostar Pen Commonly known as:  LANTUS Inject  30 Units into the skin daily.   ipratropium-albuterol 0.5-2.5 (3) MG/3ML Soln Commonly known as:  DUONEB Take 3 mLs by nebulization every 4 (four) hours as needed. For shortness of breath   metFORMIN 500 MG 24 hr tablet Commonly known as:  GLUCOPHAGE-XR Take 2 tablets (1,000 mg total) by mouth 2 (two) times daily. What changed:  how much to take   montelukast 10 MG tablet Commonly known as:  SINGULAIR Take 10 mg by mouth every morning.   oxybutynin 5 MG tablet Commonly known as:  DITROPAN Take 1 tablet (5 mg total) by mouth 2 (two) times daily.   pantoprazole 40 MG tablet Commonly known as:  PROTONIX Take 40 mg by mouth every morning.   predniSONE 20 MG tablet Commonly known as:  DELTASONE Take 3 PO QAM x5days, 2 PO QAM x5days, 1 PO QAM x5days What changed:    how much to take  how to take this  when to take this  additional instructions   ProAir HFA 108 (90  Base) MCG/ACT inhaler Generic drug:  albuterol Inhale 2 puffs into the lungs every 6 (six) hours as needed for wheezing or shortness of breath. Shortness of Breath   rosuvastatin 40 MG tablet Commonly known as:  CRESTOR Take 40 mg by mouth at bedtime.   Trelegy Ellipta 100-62.5-25 MCG/INH Aepb Generic drug:  Fluticasone-Umeclidin-Vilant Take 2 puffs by mouth daily.      Follow-up Information    Gareth Morgan, MD Follow up on 02/06/2019.   Specialty:  Family Medicine Why:  Tuesday at 11:20 with the Dr Benay Pillow information: [address] [place] [address] [phone]        Kari Baars, MD. Schedule an appointment as soon as possible for a visit in 1 week(s).   Specialty:  Pulmonary Disease Why:  Hospital Follow Up  Contact information: [address] [place] [address] [phone]          No Known Allergies Allergies as of 02/01/2019   No Known Allergies     Medication List    TAKE these medications   ALPRAZolam 1 MG tablet Commonly known as:  XANAX Take 0.5  tablets (0.5 mg total) by mouth 2 (two) times daily as needed for anxiety.   CENTRUM SILVER 50+MEN PO Take 1 tablet by mouth daily.   fexofenadine-pseudoephedrine 180-240 MG 24 hr tablet Commonly known as:  ALLEGRA-D 24 Take 1 tablet by mouth daily.   fluticasone 50 MCG/ACT nasal spray Commonly known as:  FLONASE Place 2 sprays into both nostrils daily.   glimepiride 2 MG tablet Commonly known as:  AMARYL Take 2 mg by mouth daily with breakfast.   Guaifenesin 1200 MG Tb12 Commonly known as:  Mucinex Maximum Strength Take 1 tablet (1,200 mg total) by mouth 2 (two) times daily.   Insulin Glargine 100 UNIT/ML Solostar Pen Commonly known as:  LANTUS Inject 30 Units into the skin daily.   ipratropium-albuterol 0.5-2.5 (3) MG/3ML Soln Commonly known as:  DUONEB Take 3 mLs by nebulization every 4 (four) hours as needed. For shortness of breath   metFORMIN 500 MG 24 hr tablet Commonly known as:  GLUCOPHAGE-XR Take 2 tablets (1,000 mg total) by mouth 2 (two) times daily. What changed:  how much to take   montelukast 10 MG tablet Commonly known as:  SINGULAIR Take 10 mg by mouth every morning.   oxybutynin 5 MG tablet Commonly known as:  DITROPAN Take 1 tablet (5 mg total) by mouth 2 (two) times daily.   pantoprazole 40 MG tablet Commonly known as:  PROTONIX Take 40 mg by mouth every morning.   predniSONE 20 MG tablet Commonly known as:  DELTASONE Take 3 PO QAM x5days, 2 PO QAM x5days, 1 PO QAM x5days What changed:    how much to take  how to take this  when to take this  additional instructions   ProAir HFA 108 (90 Base) MCG/ACT inhaler Generic drug:  albuterol Inhale 2 puffs into the lungs every 6 (six) hours as needed for wheezing or shortness of breath. Shortness of Breath   rosuvastatin 40 MG tablet Commonly known as:  CRESTOR Take 40 mg by mouth at bedtime.   Trelegy Ellipta 100-62.5-25 MCG/INH Aepb Generic drug:  Fluticasone-Umeclidin-Vilant Take 2  puffs by mouth daily.       Procedures/Studies: Portable Chest 1 View  Result Date: 01/26/2019 CLINICAL DATA:  63 year old male with respiratory failure. Negative for COVID-19. EXAM: PORTABLE CHEST 1 VIEW COMPARISON:  01/25/2019 and earlier. FINDINGS: Portable AP upright view at  0523 hours. Underlying large lung volumes. Stable cardiac size and mediastinal contours. The right lung appears clear, with evidence of upper lung emphysema. Right PICC line has been placed and tip is at the level of the lower SVC. Patchy and nodular left upper lung opacity has mildly regressed since yesterday. Stable left lung ventilation elsewhere. No pneumothorax or pleural effusion. IMPRESSION: 1. Right PICC line placed, tip at the lower SVC level. 2. Evidence of emphysema. Left upper lung opacity mildly regressed since yesterday. No new cardiopulmonary abnormality. Electronically Signed   By: Odessa FlemingH  Hall M.D.   On: 01/26/2019 06:04   Dg Chest Portable 1 View  Result Date: 01/25/2019 CLINICAL DATA:  Acute on chronic respiratory failure. EXAM: PORTABLE CHEST 1 VIEW COMPARISON:  Single-view of the chest 01/16/2019, 01/15/2019 and 10/27/2018. FINDINGS: The lungs are emphysematous. Increased airspace disease is seen in the left upper lobe. There is basilar fibrosis, worse on the left. Heart size is normal. No pneumothorax. IMPRESSION: Increased left upper lobe airspace opacity worrisome for pneumonia superimposed on scar. Emphysema and basilar fibrosis, worse on the left. Electronically Signed   By: Drusilla Kannerhomas  Dalessio M.D.   On: 01/25/2019 07:48   Dg Chest Port 1 View  Result Date: 01/16/2019 CLINICAL DATA:  63 year old male with pneumonia and acute on chronic respiratory failure with hypercapnia EXAM: PORTABLE CHEST 1 VIEW COMPARISON:  Multiple prior chest x-rays, most recently 01/15/2019 FINDINGS: Stable appearance of the chest without significant interval change over the last 24 hours. Persistent interstitial and airspace  opacities in the left upper and left lower lobes. No evidence of new airspace opacity, pleural effusion, pneumothorax or pulmonary edema. Stable background hyperinflation, emphysematous changes and chronic bronchitic changes. IMPRESSION: Stable chest x-ray without significant interval change. Persistent left upper and left lower lobe airspace opacities may represent residual areas of infiltrate/pneumonia versus developing pleuroparenchymal scarring. Electronically Signed   By: Malachy MoanHeath  McCullough M.D.   On: 01/16/2019 14:58   Dg Chest Port 1 View  Result Date: 01/15/2019 CLINICAL DATA:  Shortness of breath EXAM: PORTABLE CHEST 1 VIEW COMPARISON:  01/14/2019 FINDINGS: Cardiac shadows within normal limits. The right lung is again clear. Left lung again demonstrates persistent infiltrates in the left upper lobe and left base stable from the previous exam. No new focal abnormality is noted. IMPRESSION: Stable left-sided infiltrates when compare with the previous exam. Electronically Signed   By: Alcide CleverMark  Lukens M.D.   On: 01/15/2019 03:12   Dg Chest Port 1 View  Result Date: 01/14/2019 CLINICAL DATA:  Dyspnea. EXAM: PORTABLE CHEST 1 VIEW COMPARISON:  01/12/2019 FINDINGS: COPD with hyperinflation. Left upper lobe infiltrate unchanged. Left lower lobe infiltrate with progressive volume loss. No significant pleural effusion. Negative for heart failure. IMPRESSION: Left upper lobe and left lower lobe infiltrates compatible with pneumonia. Progressive volume loss in the left lung base. No effusion. Electronically Signed   By: Marlan Palauharles  Clark M.D.   On: 01/14/2019 13:52   Dg Chest Port 1 View  Result Date: 01/12/2019 CLINICAL DATA:  Respiratory failure EXAM: PORTABLE CHEST 1 VIEW COMPARISON:  01/09/2019 FINDINGS: Heart is normal size. Diffuse airspace disease throughout the left lung, most confluent in the left upper lobe. This is unchanged. No confluent opacity on the right. Heart is normal size. IMPRESSION: Diffuse  left lung airspace disease, unchanged since prior study. Electronically Signed   By: Charlett NoseKevin  Dover M.D.   On: 01/12/2019 08:08   Dg Chest Portable 1 View  Result Date: 01/09/2019 CLINICAL DATA:  Hypoxia EXAM: PORTABLE  CHEST 1 VIEW COMPARISON:  January 09, 2019 study obtained earlier in the day FINDINGS: Endotracheal tube tip is 4.0 cm above the carina. Nasogastric tube tip is in the stomach. Side port not well seen but probably near the gastroesophageal junction. No pneumothorax evident. : There is airspace consolidation throughout portions of the left upper and left lower lobe. There is mild right base atelectasis. The right lung is otherwise clear. Heart size and pulmonary vascularity are normal. No adenopathy. No bone lesions. IMPRESSION: Tube positions as described without pneumothorax. Extensive airspace opacity felt to represent multifocal pneumonia throughout left upper and lower lobes. Mild right base atelectasis. Heart size normal. Electronically Signed   By: Bretta Bang III M.D.   On: 01/09/2019 18:22   Dg Chest Port 1 View  Result Date: 01/09/2019 CLINICAL DATA:  Decreased level of consciousness and shortness of breath EXAM: PORTABLE CHEST 1 VIEW COMPARISON:  12/09/2018 FINDINGS: Cardiac shadows within normal limits. Lungs are hyperinflated similar to that seen on the prior exam. Patchy infiltrate is noted in the left upper lobe and to a lesser degree in the left lower lobe consistent with multifocal pneumonia. No sizable effusion is seen. No bony abnormality is noted. IMPRESSION: Patchy multifocal pneumonia within the left lung as described. COPD. Electronically Signed   By: Alcide Clever M.D.   On: 01/09/2019 12:46   [place] Ekg Site Rite  Result Date: 01/25/2019 If Site Rite image not attached, placement could not be confirmed due to current cardiac rhythm.    Subjective: Pt says that he is feeling much better and is breathing better, he has a bipap available for him at home, given to him as  a donation by a family member, he wants to go home.    Discharge Exam: Vitals:   02/01/19 0856 02/01/19 0900  BP:    Pulse:  (!) 104  Resp:  (!) 22  Temp:    SpO2: 100% 100%   Vitals:   02/01/19 0846 02/01/19 0851 02/01/19 0856 02/01/19 0900  BP:      Pulse:    (!) 104  Resp:    (!) 22  Temp:      TempSrc:      SpO2: 95% 100% 100% 100%  Weight:      Height:       General: Pt is alert, awake, not in acute distress Cardiovascular:  Normal S1/S2 +, no rubs, no gallops Respiratory: poor air movement, no wheezing, no rhonchi Abdominal: Soft, NT, ND, bowel sounds + Extremities: no edema, no cyanosis   The results of significant diagnostics from this hospitalization (including imaging, microbiology, ancillary and laboratory) are listed below for reference.     Microbiology: Recent Results (from the past 240 hour(s))  Blood Culture (routine x 2)     Status: None   Collection Time: 01/25/19  7:58 AM  Result Value Ref Range Status   Specimen Description RIGHT ANTECUBITAL  Final   Special Requests   Final    BOTTLES DRAWN AEROBIC AND ANAEROBIC Blood Culture adequate volume   Culture   Final    NO GROWTH 5 DAYS Performed at [facility], [address], [place], [place] [address]    Report Status 01/30/2019 FINAL  Final  Blood Culture (routine x 2)     Status: None   Collection Time: 01/25/19  9:01 AM  Result Value Ref Range Status   Specimen Description RIGHT ANTECUBITAL  Final   Special Requests   Final    BOTTLES  DRAWN AEROBIC AND ANAEROBIC Blood Culture adequate volume   Culture   Final    NO GROWTH 5 DAYS Performed at [facility], [address], [place], [place] [address]    Report Status 01/30/2019 FINAL  Final  SARS Coronavirus 2 [facility] order, Performed in [facility] hospital lab)     Status: None   Collection Time: 01/25/19 10:51 AM  Result Value Ref Range Status   SARS Coronavirus 2 NEGATIVE NEGATIVE Final    Comment: (NOTE) If result is  NEGATIVE SARS-CoV-2 target nucleic acids are NOT DETECTED. The SARS-CoV-2 RNA is generally detectable in upper and lower  respiratory specimens during the acute phase of infection. The lowest  concentration of SARS-CoV-2 viral copies this assay can detect is 250  copies / mL. A negative result does not preclude SARS-CoV-2 infection  and should not be used as the sole basis for treatment or other  patient management decisions.  A negative result may occur with  improper specimen collection / handling, submission of specimen other  than nasopharyngeal swab, presence of viral mutation(s) within the  areas targeted by this assay, and inadequate number of viral copies  (<250 copies / mL). A negative result must be combined with clinical  observations, patient history, and epidemiological information. If result is POSITIVE SARS-CoV-2 target nucleic acids are DETECTED. The SARS-CoV-2 RNA is generally detectable in upper and lower  respiratory specimens dur ing the acute phase of infection.  Positive  results are indicative of active infection with SARS-CoV-2.  Clinical  correlation with patient history and other diagnostic information is  necessary to determine patient infection status.  Positive results do  not rule out bacterial infection or co-infection with other viruses. If result is PRESUMPTIVE POSTIVE SARS-CoV-2 nucleic acids MAY BE PRESENT.   A presumptive positive result was obtained on the submitted specimen  and confirmed on repeat testing.  While 2019 novel coronavirus  (SARS-CoV-2) nucleic acids may be present in the submitted sample  additional confirmatory testing may be necessary for epidemiological  and / or clinical management purposes  to differentiate between  SARS-CoV-2 and other Sarbecovirus currently known to infect humans.  If clinically indicated additional testing with an alternate test  methodology [phone]) is advised. The SARS-CoV-2 RNA is generally  detectable  in upper and lower respiratory sp ecimens during the acute  phase of infection. The expected result is Negative. Fact Sheet for Patients:  BoilerBrush.com.cy Fact Sheet for Healthcare Providers: https://pope.com/ This test is not yet approved or cleared by the [place] FDA and has been authorized for detection and/or diagnosis of SARS-CoV-2 by FDA under an Emergency Use Authorization (EUA).  This EUA will remain in effect (meaning this test can be used) for the duration of the COVID-19 declaration under Section 564(b)(1) of the Act, 21 U.S.C. section 360bbb-3(b)(1), unless the authorization is terminated or revoked sooner. Performed at [facility], [address] [place], [place] [address]   Respiratory Panel by PCR     Status: None   Collection Time: 01/25/19 12:32 PM  Result Value Ref Range Status   Adenovirus NOT DETECTED NOT DETECTED Final   Coronavirus 229E NOT DETECTED NOT DETECTED Final    Comment: (NOTE) The Coronavirus on the Respiratory Panel, DOES NOT test for the novel  Coronavirus (2019 nCoV)    Coronavirus HKU1 NOT DETECTED NOT DETECTED Final   Coronavirus NL63 NOT DETECTED NOT DETECTED Final   Coronavirus OC43 NOT DETECTED NOT DETECTED Final   Metapneumovirus NOT DETECTED NOT  DETECTED Final   Rhinovirus / Enterovirus NOT DETECTED NOT DETECTED Final   Influenza A NOT DETECTED NOT DETECTED Final   Influenza B NOT DETECTED NOT DETECTED Final   Parainfluenza Virus 1 NOT DETECTED NOT DETECTED Final   Parainfluenza Virus 2 NOT DETECTED NOT DETECTED Final   Parainfluenza Virus 3 NOT DETECTED NOT DETECTED Final   Parainfluenza Virus 4 NOT DETECTED NOT DETECTED Final   Respiratory Syncytial Virus NOT DETECTED NOT DETECTED Final   Bordetella pertussis NOT DETECTED NOT DETECTED Final   Chlamydophila pneumoniae NOT DETECTED NOT DETECTED Final   Mycoplasma pneumoniae NOT DETECTED NOT DETECTED Final    Comment: Performed at  [facility], [address], [place], [place] [address]     Labs: BNP (last 3 results) Recent Labs    01/27/19 0412 01/28/19 0640 01/29/19 0513  BNP 121.0* 83.0 130.0*   Basic Metabolic Panel: Recent Labs  Lab 01/26/19 0542 01/27/19 0412 01/28/19 0640 01/29/19 0513 01/30/19 0410  NA 133* 133* 133* 137 136  K 4.4 4.4 4.3 4.0 4.1  CL 98 98 98 102 101  CO2 26 29 30 29 28   GLUCOSE 252* 243* 301* 98 158*  BUN 16 18 24* 23 24*  CREATININE 0.73 0.79 0.90 0.79 0.74  CALCIUM 8.9 9.2 9.1 9.3 9.2  MG 2.1 2.1  --   --   --    Liver Function Tests: Recent Labs  Lab 01/26/19 0542 01/27/19 0412 01/28/19 0640 01/29/19 0513 01/30/19 0410  AST 12* 15 17 16 17   ALT 30 26 32 30 33  ALKPHOS 133* 121 122 116 105  BILITOT 0.5 0.1* 0.3 0.3 0.3  PROT 5.7* 5.3* 5.7* 5.5* 5.5*  ALBUMIN 1.9* 1.7* 1.9* 1.9* 1.8*   No results for input(s): LIPASE, AMYLASE in the last 168 hours. No results for input(s): AMMONIA in the last 168 hours. CBC: Recent Labs  Lab 01/26/19 0542 01/27/19 0412 01/28/19 0640 01/29/19 0513 01/30/19 0410  WBC 32.2* 30.6* 25.1* 24.5* 25.5*  NEUTROABS 31.1* 29.4* 24.0* 23.2* 24.3*  HGB 10.6* 9.5* 9.3* 9.3* 8.8*  HCT 35.3* 31.0* 29.7* 30.6* 29.3*  MCV 88.5 87.6 86.3 87.9 88.5  PLT 233 260 286 285 249   Cardiac Enzymes: No results for input(s): CKTOTAL, CKMB, CKMBINDEX, TROPONINI in the last 168 hours. BNP: Invalid input(s): POCBNP CBG: Recent Labs  Lab 01/31/19 1615 01/31/19 1633 01/31/19 2110 02/01/19 0311 02/01/19 0738  GLUCAP 44* 75 240* 169* 149*   D-Dimer No results for input(s): DDIMER in the last 72 hours. Hgb A1c No results for input(s): HGBA1C in the last 72 hours. Lipid Profile No results for input(s): CHOL, HDL, LDLCALC, TRIG, CHOLHDL, LDLDIRECT in the last 72 hours. Thyroid function studies No results for input(s): TSH, T4TOTAL, T3FREE, THYROIDAB in the last 72 hours.  Invalid input(s): FREET3 Anemia work up No results for  input(s): VITAMINB12, FOLATE, FERRITIN, TIBC, IRON, RETICCTPCT in the last 72 hours. Urinalysis    Component Value Date/Time   COLORURINE YELLOW 01/25/2019 0743   APPEARANCEUR HAZY (A) 01/25/2019 0743   LABSPEC 1.027 01/25/2019 0743   PHURINE 7.0 01/25/2019 0743   GLUCOSEU 150 (A) 01/25/2019 0743   HGBUR NEGATIVE 01/25/2019 0743   BILIRUBINUR NEGATIVE 01/25/2019 0743   KETONESUR NEGATIVE 01/25/2019 0743   PROTEINUR 30 (A) 01/25/2019 0743   UROBILINOGEN 0.2 02/08/2008 1731   NITRITE NEGATIVE 01/25/2019 0743   LEUKOCYTESUR NEGATIVE 01/25/2019 0743   Sepsis Labs Invalid input(s): PROCALCITONIN,  WBC,  LACTICIDVEN Microbiology Recent Results (from  the past 240 hour(s))  Blood Culture (routine x 2)     Status: None   Collection Time: 01/25/19  7:58 AM  Result Value Ref Range Status   Specimen Description RIGHT ANTECUBITAL  Final   Special Requests   Final    BOTTLES DRAWN AEROBIC AND ANAEROBIC Blood Culture adequate volume   Culture   Final    NO GROWTH 5 DAYS Performed at [facility], [address], [place], [place] [address]    Report Status 01/30/2019 FINAL  Final  Blood Culture (routine x 2)     Status: None   Collection Time: 01/25/19  9:01 AM  Result Value Ref Range Status   Specimen Description RIGHT ANTECUBITAL  Final   Special Requests   Final    BOTTLES DRAWN AEROBIC AND ANAEROBIC Blood Culture adequate volume   Culture   Final    NO GROWTH 5 DAYS Performed at [facility], [address], [place], [place] [address]    Report Status 01/30/2019 FINAL  Final  SARS Coronavirus 2 [facility] order, Performed in [facility] hospital lab)     Status: None   Collection Time: 01/25/19 10:51 AM  Result Value Ref Range Status   SARS Coronavirus 2 NEGATIVE NEGATIVE Final    Comment: (NOTE) If result is NEGATIVE SARS-CoV-2 target nucleic acids are NOT DETECTED. The SARS-CoV-2 RNA is generally detectable in upper and lower  respiratory specimens during the acute phase of  infection. The lowest  concentration of SARS-CoV-2 viral copies this assay can detect is 250  copies / mL. A negative result does not preclude SARS-CoV-2 infection  and should not be used as the sole basis for treatment or other  patient management decisions.  A negative result may occur with  improper specimen collection / handling, submission of specimen other  than nasopharyngeal swab, presence of viral mutation(s) within the  areas targeted by this assay, and inadequate number of viral copies  (<250 copies / mL). A negative result must be combined with clinical  observations, patient history, and epidemiological information. If result is POSITIVE SARS-CoV-2 target nucleic acids are DETECTED. The SARS-CoV-2 RNA is generally detectable in upper and lower  respiratory specimens dur ing the acute phase of infection.  Positive  results are indicative of active infection with SARS-CoV-2.  Clinical  correlation with patient history and other diagnostic information is  necessary to determine patient infection status.  Positive results do  not rule out bacterial infection or co-infection with other viruses. If result is PRESUMPTIVE POSTIVE SARS-CoV-2 nucleic acids MAY BE PRESENT.   A presumptive positive result was obtained on the submitted specimen  and confirmed on repeat testing.  While 2019 novel coronavirus  (SARS-CoV-2) nucleic acids may be present in the submitted sample  additional confirmatory testing may be necessary for epidemiological  and / or clinical management purposes  to differentiate between  SARS-CoV-2 and other Sarbecovirus currently known to infect humans.  If clinically indicated additional testing with an alternate test  methodology [id number]) is advised. The SARS-CoV-2 RNA is generally  detectable in upper and lower respiratory sp ecimens during the acute  phase of infection. The expected result is Negative. Fact Sheet for Patients:   BoilerBrush.com.cy Fact Sheet for Healthcare Providers: https://pope.com/ This test is not yet approved or cleared by the [place] FDA and has been authorized for detection and/or diagnosis of SARS-CoV-2 by FDA under an Emergency Use Authorization (EUA).  This EUA will remain in effect (meaning this test can  be used) for the duration of the COVID-19 declaration under Section 564(b)(1) of the Act, 21 U.S.C. section 360bbb-3(b)(1), unless the authorization is terminated or revoked sooner. Performed at [facility], [address], [place], [place] [address]   Respiratory Panel by PCR     Status: None   Collection Time: 01/25/19 12:32 PM  Result Value Ref Range Status   Adenovirus NOT DETECTED NOT DETECTED Final   Coronavirus 229E NOT DETECTED NOT DETECTED Final    Comment: (NOTE) The Coronavirus on the Respiratory Panel, DOES NOT test for the novel  Coronavirus (2019 nCoV)    Coronavirus HKU1 NOT DETECTED NOT DETECTED Final   Coronavirus NL63 NOT DETECTED NOT DETECTED Final   Coronavirus OC43 NOT DETECTED NOT DETECTED Final   Metapneumovirus NOT DETECTED NOT DETECTED Final   Rhinovirus / Enterovirus NOT DETECTED NOT DETECTED Final   Influenza A NOT DETECTED NOT DETECTED Final   Influenza B NOT DETECTED NOT DETECTED Final   Parainfluenza Virus 1 NOT DETECTED NOT DETECTED Final   Parainfluenza Virus 2 NOT DETECTED NOT DETECTED Final   Parainfluenza Virus 3 NOT DETECTED NOT DETECTED Final   Parainfluenza Virus 4 NOT DETECTED NOT DETECTED Final   Respiratory Syncytial Virus NOT DETECTED NOT DETECTED Final   Bordetella pertussis NOT DETECTED NOT DETECTED Final   Chlamydophila pneumoniae NOT DETECTED NOT DETECTED Final   Mycoplasma pneumoniae NOT DETECTED NOT DETECTED Final    Comment: Performed at [facility], [address] [place], [place] [address]   Time coordinating discharge:  34 minutes   SIGNED:  Standley Dakins,  MD  [facility] 02/01/2019, 10:08 AM How to contact the [facility] Attending or Consulting provider 7A - 7P or covering provider during after hours 7P -7A, for this patient?  1. Check the care team in [facility] and look for a) attending/consulting TR[facility] provider listed and b) the [facility] team listed 2. Log into [email or web address] and use [facility]'s universal password to access. If you do not have the password, please contact the hospital operator. 3. Locate the [facility] provider you are looking for under [facility] and page to a number that you can be directly reached. 4. If you still have difficulty reaching the provider, please page the [facility] (Director on Call) for the Hospitalists listed on amion for assistance.

## 2019-02-01 NOTE — Progress Notes (Signed)
Patient transported from ED to CT then to ICU 6 without any complications.

## 2019-02-01 NOTE — ED Notes (Signed)
Hospitalist at bedside 

## 2019-02-02 ENCOUNTER — Encounter (HOSPITAL_COMMUNITY): Payer: Self-pay

## 2019-02-02 DIAGNOSIS — E1169 Type 2 diabetes mellitus with other specified complication: Secondary | ICD-10-CM | POA: Diagnosis not present

## 2019-02-02 DIAGNOSIS — K219 Gastro-esophageal reflux disease without esophagitis: Secondary | ICD-10-CM | POA: Diagnosis present

## 2019-02-02 DIAGNOSIS — I2699 Other pulmonary embolism without acute cor pulmonale: Secondary | ICD-10-CM | POA: Diagnosis present

## 2019-02-02 DIAGNOSIS — Z79899 Other long term (current) drug therapy: Secondary | ICD-10-CM | POA: Diagnosis not present

## 2019-02-02 DIAGNOSIS — R0602 Shortness of breath: Secondary | ICD-10-CM | POA: Diagnosis present

## 2019-02-02 DIAGNOSIS — I10 Essential (primary) hypertension: Secondary | ICD-10-CM | POA: Diagnosis present

## 2019-02-02 DIAGNOSIS — Z9981 Dependence on supplemental oxygen: Secondary | ICD-10-CM | POA: Diagnosis not present

## 2019-02-02 DIAGNOSIS — Z87891 Personal history of nicotine dependence: Secondary | ICD-10-CM | POA: Diagnosis not present

## 2019-02-02 DIAGNOSIS — J441 Chronic obstructive pulmonary disease with (acute) exacerbation: Secondary | ICD-10-CM | POA: Diagnosis present

## 2019-02-02 DIAGNOSIS — E785 Hyperlipidemia, unspecified: Secondary | ICD-10-CM | POA: Diagnosis present

## 2019-02-02 DIAGNOSIS — F411 Generalized anxiety disorder: Secondary | ICD-10-CM | POA: Diagnosis present

## 2019-02-02 DIAGNOSIS — F419 Anxiety disorder, unspecified: Secondary | ICD-10-CM | POA: Diagnosis not present

## 2019-02-02 DIAGNOSIS — J9622 Acute and chronic respiratory failure with hypercapnia: Secondary | ICD-10-CM | POA: Diagnosis not present

## 2019-02-02 DIAGNOSIS — J9621 Acute and chronic respiratory failure with hypoxia: Secondary | ICD-10-CM | POA: Diagnosis present

## 2019-02-02 DIAGNOSIS — Z7952 Long term (current) use of systemic steroids: Secondary | ICD-10-CM | POA: Diagnosis not present

## 2019-02-02 DIAGNOSIS — Z794 Long term (current) use of insulin: Secondary | ICD-10-CM | POA: Diagnosis not present

## 2019-02-02 DIAGNOSIS — E1165 Type 2 diabetes mellitus with hyperglycemia: Secondary | ICD-10-CM | POA: Diagnosis present

## 2019-02-02 DIAGNOSIS — Z833 Family history of diabetes mellitus: Secondary | ICD-10-CM | POA: Diagnosis not present

## 2019-02-02 LAB — GLUCOSE, CAPILLARY
Glucose-Capillary: 72 mg/dL (ref 70–99)
Glucose-Capillary: 72 mg/dL (ref 70–99)
Glucose-Capillary: 78 mg/dL (ref 70–99)
Glucose-Capillary: 50 mg/dL — ABNORMAL LOW (ref 70–99)
Glucose-Capillary: 86 mg/dL (ref 70–99)
Glucose-Capillary: 187 mg/dL — ABNORMAL HIGH (ref 70–99)
Glucose-Capillary: 46 mg/dL — ABNORMAL LOW (ref 70–99)
Glucose-Capillary: 203 mg/dL — ABNORMAL HIGH (ref 70–99)
Glucose-Capillary: 235 mg/dL — ABNORMAL HIGH (ref 70–99)
Glucose-Capillary: 222 mg/dL — ABNORMAL HIGH (ref 70–99)
Glucose-Capillary: 216 mg/dL — ABNORMAL HIGH (ref 70–99)
Glucose-Capillary: 175 mg/dL — ABNORMAL HIGH (ref 70–99)
Glucose-Capillary: 164 mg/dL — ABNORMAL HIGH (ref 70–99)

## 2019-02-02 LAB — BASIC METABOLIC PANEL
Anion gap: 9 (ref 5–15)
BUN: 21 mg/dL (ref 8–23)
CO2: 28 mmol/L (ref 22–32)
Calcium: 9 mg/dL (ref 8.9–10.3)
Chloride: 102 mmol/L (ref 98–111)
Creatinine, Ser: 0.82 mg/dL (ref 0.61–1.24)
GFR calc Af Amer: >60 mL/min (ref >60)
GFR calc non Af Amer: >60 mL/min (ref >60)
Glucose, Bld: 66 mg/dL — ABNORMAL LOW (ref 70–99)
Potassium: 4.7 mmol/L (ref 3.5–5.1)
Sodium: 139 mmol/L (ref 135–145)

## 2019-02-02 LAB — CBC
HCT: 34 % — ABNORMAL LOW (ref 39.0–52.0)
Hemoglobin: 10.1 g/dL — ABNORMAL LOW (ref 13.0–17.0)
MCH: 26.8 pg (ref 26.0–34.0)
MCHC: 29.7 g/dL — ABNORMAL LOW (ref 30.0–36.0)
MCV: 90.2 fL (ref 80.0–100.0)
Platelets: 175 10*3/uL (ref 150–400)
RBC: 3.77 MIL/uL — ABNORMAL LOW (ref 4.22–5.81)
RDW: 15.3 % (ref 11.5–15.5)
WBC: 24.3 10*3/uL — ABNORMAL HIGH (ref 4.0–10.5)
nRBC: 0 % (ref 0.0–0.2)

## 2019-02-02 MED ORDER — INSULIN ASPART 100 UNIT/ML ~~LOC~~ SOLN
0.0000 [IU] | Freq: Every day | SUBCUTANEOUS | Status: DC
  Administered 2019-02-03: 3 [IU] via SUBCUTANEOUS
  Administered 2019-02-04: 22:00:00 2 [IU] via SUBCUTANEOUS

## 2019-02-02 MED ORDER — ORAL CARE MOUTH RINSE
15.0000 mL | Freq: Two times a day (BID) | OROMUCOSAL | Status: DC
  Administered 2019-02-02 – 2019-02-05 (×6): 15 mL via OROMUCOSAL

## 2019-02-02 MED ORDER — IPRATROPIUM-ALBUTEROL 0.5-2.5 (3) MG/3ML IN SOLN
3.0000 mL | RESPIRATORY_TRACT | Status: DC
  Administered 2019-02-02 – 2019-02-05 (×16): 3 mL via RESPIRATORY_TRACT
  Filled 2019-02-02 (×16): qty 3

## 2019-02-02 MED ORDER — CHLORHEXIDINE GLUCONATE CLOTH 2 % EX PADS
6.0000 | MEDICATED_PAD | Freq: Every day | CUTANEOUS | Status: DC
  Administered 2019-02-02 – 2019-02-05 (×4): 6 via TOPICAL

## 2019-02-02 MED ORDER — CHLORHEXIDINE GLUCONATE 0.12 % MT SOLN
15.0000 mL | Freq: Two times a day (BID) | OROMUCOSAL | Status: DC
  Administered 2019-02-02 – 2019-02-05 (×7): 15 mL via OROMUCOSAL
  Filled 2019-02-02 (×6): qty 15

## 2019-02-02 MED ORDER — DEXTROSE 50 % IV SOLN
25.0000 mL | Freq: Once | INTRAVENOUS | Status: AC
  Administered 2019-02-02: 25 mL via INTRAVENOUS

## 2019-02-02 MED ORDER — INSULIN ASPART 100 UNIT/ML ~~LOC~~ SOLN
0.0000 [IU] | Freq: Three times a day (TID) | SUBCUTANEOUS | Status: DC
  Administered 2019-02-02: 17:00:00 3 [IU] via SUBCUTANEOUS
  Administered 2019-02-03: 5 [IU] via SUBCUTANEOUS
  Administered 2019-02-03: 12:00:00 2 [IU] via SUBCUTANEOUS
  Administered 2019-02-04: 11:00:00 3 [IU] via SUBCUTANEOUS
  Administered 2019-02-04: 7 [IU] via SUBCUTANEOUS
  Administered 2019-02-05: 3 [IU] via SUBCUTANEOUS
  Administered 2019-02-05: 2 [IU] via SUBCUTANEOUS

## 2019-02-02 MED ORDER — SODIUM CHLORIDE 0.9% FLUSH
10.0000 mL | Freq: Two times a day (BID) | INTRAVENOUS | Status: DC
  Administered 2019-02-02 – 2019-02-04 (×6): 10 mL
  Administered 2019-02-05: 11:00:00 20 mL

## 2019-02-02 MED ORDER — DEXTROSE 50 % IV SOLN
INTRAVENOUS | Status: AC
  Filled 2019-02-02: qty 50

## 2019-02-02 MED ORDER — SODIUM CHLORIDE 0.9% FLUSH: 10.0000 mL | INTRAVENOUS | Status: DC | PRN

## 2019-02-02 NOTE — Progress Notes (Signed)
Peripherally Inserted Central Catheter/Midline Placement  The IV Nurse has discussed with the patient and/or persons authorized to consent for the patient, the purpose of this procedure and the potential benefits and risks involved with this procedure.  The benefits include less needle sticks, lab draws from the catheter, and the patient may be discharged home with the catheter. Risks include, but not limited to, infection, bleeding, blood clot (thrombus formation), and puncture of an artery; nerve damage and irregular heartbeat and possibility to perform a PICC exchange if needed/ordered by physician.  Alternatives to this procedure were also discussed.  Bard Power PICC patient education guide, fact sheet on infection prevention and patient information card has been provided to patient /or left at bedside.    PICC/Midline Placement Documentation  PICC Double Lumen 02/02/19 PICC Right Brachial 44 cm 0 cm (Active)  Indication for Insertion or Continuance of Line Poor Vasculature-patient has had multiple peripheral attempts or PIVs lasting less than 24 hours 02/02/2019  1:00 PM  Exposed Catheter (cm) 0 cm 02/02/2019  1:00 PM  Site Assessment Clean;Dry;Intact 02/02/2019  1:00 PM  Lumen #1 Status Flushed;Saline locked;Blood return noted 02/02/2019  1:00 PM  Lumen #2 Status Flushed;Saline locked;Blood return noted 02/02/2019  1:00 PM  Dressing Type Transparent;Securing device 02/02/2019  1:00 PM  Dressing Status Clean;Dry;Intact;Antimicrobial disc in place 02/02/2019  1:00 PM  Dressing Change Due 02/09/19 02/02/2019  1:00 PM       Franne Grip Renee 02/02/2019, 1:34 PM

## 2019-02-02 NOTE — Progress Notes (Addendum)
Inpatient Diabetes Program Recommendations  AACE/ADA: New Consensus Statement on Inpatient Glycemic Control (2015)  Target Ranges:  Prepandial:   less than 140 mg/dL      Peak postprandial:   less than 180 mg/dL (1-2 hours)      Critically ill patients:  140 - 180 mg/dL   Results for Jeff Ray, TURNQUIST (MRN [id number]) as of 02/02/2019 07:37  Ref. Range 02/01/2019 03:11 02/01/2019 07:38 02/01/2019 11:17 02/01/2019 19:50 02/01/2019 20:15 02/01/2019 21:14 02/01/2019 22:28  Glucose-Capillary Latest Ref Range: 70 - 99 mg/dL 117 (H) 356 (H)  11 units NOVOLOG  124 (H)    40 units LANTUS given at 10am 56 (L) 73 93 84   Results for Jeff Ray, [name] (MRN [id number]) as of 02/02/2019 10:02  Ref. Range 02/01/2019 23:22 02/02/2019 02:04 02/02/2019 04:25 02/02/2019 06:38 02/02/2019 07:35 02/02/2019 09:43  Glucose-Capillary Latest Ref Range: 70 - 99 mg/dL 72 72 78 50 (L) 86 46 (L)     Admit with: SOB/ COPD  History: DM, End-Stage COPD  Home DM Meds: Lantus 30 units Daily       Amaryl 2 mg Daily       Metformin 1000 mg BID  Current Orders: None     Solumedrol stopped--Last dose given yesterday (04/30) at Midnight.  Now getting Prednisone 60 mg Daily.  Discharged home yesterday and readmitted again last PM.  Note patient with Mild Hypoglycemia yesterday at 8pm and again today at 6:30am.  Severe Hypoglycemia at 9:43am today.  Note Lantus has been discontinued.     MD- Note patient discharged home yesterday and readmitted later that evening.  Patient does not currently have orders for Novolog Correction scale (SSI).  If CBGs rise later today, Please consider placing orders for Novolog Sensitive Correction Scale/ SSI (0-9 units) TID AC + HS since Lantus has been stopped      --Will follow patient during hospitalization--  Ambrose Finland RN, MSN, CDE Diabetes Coordinator Inpatient Glycemic Control Team Team Pager: [phone] (8a-5p)

## 2019-02-02 NOTE — Progress Notes (Signed)
Hypoglycemic Event  CBG: 46  Treatment: 8 oz orange juice PO and graham crackers  Symptoms: Lethargic  Follow-up CBG: Time: 1015 CBG Result: 187  Possible Reasons for Event:  Comments/MD notified: Hypoglycemic standing orders   Jeff Ray

## 2019-02-02 NOTE — Progress Notes (Signed)
PROGRESS NOTE  CACHE BILLS  UJW:[id number]  DOB: 17-Jul-1956  DOA: 02/01/2019 PCP: Gareth Morgan, MD  Brief Admission Hx: 63 y/o male with end stage COPD just discharged yesterday after being treated with acute on chronic respiratory failure/COPD exacerbation returned after ED became more short of breath and anxious at home.  His BiPAP was not delivered to his home and he did not have it available at home.  The plan was to discharge him home when his BiPAP was available at home.  MDM/Assessment & Plan:   1. Acute on chronic respiratory failure with hypoxia- he does well when he is able to use BiPAP when needed.  He is feeling better now.  Arrangements are being made for him to have a BiPAP delivered to his home.  Case management says that he should be able to have it delivered on Monday.  He will have to stay in the hospital until that time continue current treatments. 2. COPD exacerbation- continue prednisone and current bronchodilators, BiPAP available. 3. Type 2 diabetes mellitus, insulin requiring- apparently he had taken his Amaryl at home and now he is having hypoglycemia.  We have held his basal insulin and given him some dextrose infusion and holding Lantus temporarily.  Continue sliding scale coverage. 4. GAD-alprazolam ordered as needed for symptoms. 5. Hyperlipidemia-continue statin therapy  DVT prophylaxis: Lovenox Code Status: Full Family Communication: Wife telephone Disposition Plan: Unfortunately he will not be able to have his BiPAP delivered to home until Monday   Subjective: Patient says he feels much better after he had gotten some BiPAP treatment.  Objective: Vitals:   02/02/19 1300 02/02/19 1400 02/02/19 1534 02/02/19 1536  BP: 106/63 112/67  104/68  Pulse: (!) 105 97  93  Resp: (!) 26 (!) 30  (!) 33  Temp:      TempSrc:      SpO2: 99% 98% 100% 100%  Weight:      Height:        Intake/Output Summary (Last 24 hours) at 02/02/2019 1629 Last data filed at  02/02/2019 1300 Gross per 24 hour  Intake 532.5 ml  Output 1400 ml  Net -867.5 ml   Filed Weights   02/01/19 1854  Weight: 73.1 kg     REVIEW OF SYSTEMS  As per history otherwise all reviewed and reported negative  Exam:  General exam: Sitting in the bed awake alert in no apparent distress eating breakfast. Respiratory system: Unchanged poor air movement bilaterally. No increased work of breathing. Cardiovascular system: S1 & S2 heard. No JVD, murmurs, gallops, clicks or pedal edema. Gastrointestinal system: Abdomen is nondistended, soft and nontender. Normal bowel sounds heard. Central nervous system: Alert and oriented. No focal neurological deficits. Extremities: no CCE.  Data Reviewed: Basic Metabolic Panel: Recent Labs  Lab 01/27/19 0412 01/28/19 0640 01/29/19 0513 01/30/19 0410 02/01/19 1937 02/02/19 0509  NA 133* 133* 137 136 139 139  K 4.4 4.3 4.0 4.1 4.2 4.7  CL 98 98 102 101 98 102  CO2 GLUCOSE 243* 301* 98 158* 69* 66*  BUN 18 24* 23 24* 22 21  CREATININE 0.79 0.90 0.79 0.74 1.01 0.82  CALCIUM 9.2 9.1 9.3 9.2 9.8 9.0  MG 2.1  --   --   --   --   --    Liver Function Tests: Recent Labs  Lab 01/27/19 0412 01/28/19 0640 01/29/19 0513 01/30/19 0410  AST ALT 26 32 30  33  ALKPHOS 121 122 116 105  BILITOT 0.1* 0.3 0.3 0.3  PROT 5.3* 5.7* 5.5* 5.5*  ALBUMIN 1.7* 1.9* 1.9* 1.8*   No results for input(s): LIPASE, AMYLASE in the last 168 hours. No results for input(s): AMMONIA in the last 168 hours. CBC: Recent Labs  Lab 01/27/19 0412 01/28/19 0640 01/29/19 0513 01/30/19 0410 02/01/19 1937 02/02/19 0509  WBC 30.6* 25.1* 24.5* 25.5* 26.7* 24.3*  NEUTROABS 29.4* 24.0* 23.2* 24.3*  --   --   HGB 9.5* 9.3* 9.3* 8.8* 12.5* 10.1*  HCT 31.0* 29.7* 30.6* 29.3* 41.7 34.0*  MCV 87.6 86.3 87.9 88.5 89.7 90.2  PLT 260 286 285 249 242 175   Cardiac Enzymes: No results for input(s): CKTOTAL, CKMB, CKMBINDEX, TROPONINI in  the last 168 hours. CBG (last 3)  Recent Labs    02/02/19 1137 02/02/19 1408 02/02/19 1609  GLUCAP 203* 235* 222*   Recent Results (from the past 240 hour(s))  Blood Culture (routine x 2)     Status: None   Collection Time: 01/25/19  7:58 AM  Result Value Ref Range Status   Specimen Description RIGHT ANTECUBITAL  Final   Special Requests   Final    BOTTLES DRAWN AEROBIC AND ANAEROBIC Blood Culture adequate volume   Culture   Final    NO GROWTH 5 DAYS Performed at [facility], [address], [place], [place] [address]    Report Status 01/30/2019 FINAL  Final  Blood Culture (routine x 2)     Status: None   Collection Time: 01/25/19  9:01 AM  Result Value Ref Range Status   Specimen Description RIGHT ANTECUBITAL  Final   Special Requests   Final    BOTTLES DRAWN AEROBIC AND ANAEROBIC Blood Culture adequate volume   Culture   Final    NO GROWTH 5 DAYS Performed at [facility], [address] [place], [place] [address]    Report Status 01/30/2019 FINAL  Final  SARS Coronavirus 2 [facility] order, Performed in [facility] hospital lab)     Status: None   Collection Time: 01/25/19 10:51 AM  Result Value Ref Range Status   SARS Coronavirus 2 NEGATIVE NEGATIVE Final    Comment: (NOTE) If result is NEGATIVE SARS-CoV-2 target nucleic acids are NOT DETECTED. The SARS-CoV-2 RNA is generally detectable in upper and lower  respiratory specimens during the acute phase of infection. The lowest  concentration of SARS-CoV-2 viral copies this assay can detect is 250  copies / mL. A negative result does not preclude SARS-CoV-2 infection  and should not be used as the sole basis for treatment or other  patient management decisions.  A negative result may occur with  improper specimen collection / handling, submission of specimen other  than nasopharyngeal swab, presence of viral mutation(s) within the  areas targeted by this assay, and inadequate number of viral copies  (<250 copies / mL).  A negative result must be combined with clinical  observations, patient history, and epidemiological information. If result is POSITIVE SARS-CoV-2 target nucleic acids are DETECTED. The SARS-CoV-2 RNA is generally detectable in upper and lower  respiratory specimens dur ing the acute phase of infection.  Positive  results are indicative of active infection with SARS-CoV-2.  Clinical  correlation with patient history and other diagnostic information is  necessary to determine patient infection status.  Positive results do  not rule out bacterial infection or co-infection with other viruses. If result is PRESUMPTIVE POSTIVE SARS-CoV-2 nucleic acids MAY BE PRESENT.  A presumptive positive result was obtained on the submitted specimen  and confirmed on repeat testing.  While 2019 novel coronavirus  (SARS-CoV-2) nucleic acids may be present in the submitted sample  additional confirmatory testing may be necessary for epidemiological  and / or clinical management purposes  to differentiate between  SARS-CoV-2 and other Sarbecovirus currently known to infect humans.  If clinically indicated additional testing with an alternate test  methodology [phone]) is advised. The SARS-CoV-2 RNA is generally  detectable in upper and lower respiratory sp ecimens during the acute  phase of infection. The expected result is Negative. Fact Sheet for Patients:  BoilerBrush.com.cy Fact Sheet for Healthcare Providers: https://pope.com/ This test is not yet approved or cleared by the [place] FDA and has been authorized for detection and/or diagnosis of SARS-CoV-2 by FDA under an Emergency Use Authorization (EUA).  This EUA will remain in effect (meaning this test can be used) for the duration of the COVID-19 declaration under Section 564(b)(1) of the Act, 21 U.S.C. section 360bbb-3(b)(1), unless the authorization is terminated or revoked sooner.  Performed at [facility], [address], [place], [place] [address]   Respiratory Panel by PCR     Status: None   Collection Time: 01/25/19 12:32 PM  Result Value Ref Range Status   Adenovirus NOT DETECTED NOT DETECTED Final   Coronavirus 229E NOT DETECTED NOT DETECTED Final    Comment: (NOTE) The Coronavirus on the Respiratory Panel, DOES NOT test for the novel  Coronavirus (2019 nCoV)    Coronavirus HKU1 NOT DETECTED NOT DETECTED Final   Coronavirus NL63 NOT DETECTED NOT DETECTED Final   Coronavirus OC43 NOT DETECTED NOT DETECTED Final   Metapneumovirus NOT DETECTED NOT DETECTED Final   Rhinovirus / Enterovirus NOT DETECTED NOT DETECTED Final   Influenza A NOT DETECTED NOT DETECTED Final   Influenza B NOT DETECTED NOT DETECTED Final   Parainfluenza Virus 1 NOT DETECTED NOT DETECTED Final   Parainfluenza Virus 2 NOT DETECTED NOT DETECTED Final   Parainfluenza Virus 3 NOT DETECTED NOT DETECTED Final   Parainfluenza Virus 4 NOT DETECTED NOT DETECTED Final   Respiratory Syncytial Virus NOT DETECTED NOT DETECTED Final   Bordetella pertussis NOT DETECTED NOT DETECTED Final   Chlamydophila pneumoniae NOT DETECTED NOT DETECTED Final   Mycoplasma pneumoniae NOT DETECTED NOT DETECTED Final    Comment: Performed at [facility], [address], [place], [place] [address]     Studies: Ct Chest Wo Contrast  Result Date: 02/01/2019 CLINICAL DATA:  63 year old male with respiratory failure, unresolved left upper lobe opacity since 01/09/2019. Negative for COVID-19 X3 over the course of this month. EXAM: CT CHEST WITHOUT CONTRAST TECHNIQUE: Multidetector CT imaging of the chest was performed following the standard protocol without IV contrast. COMPARISON:  Portable chest earlier today, and earlier. Chest CT 06/03/2011. FINDINGS: Cardiovascular: No cardiomegaly or pericardial effusion. Calcified coronary artery atherosclerosis and/or stents. Lesser Calcified aortic atherosclerosis. Vascular  patency is not evaluated in the absence of IV contrast. Mediastinum/Nodes: Reactive appearing prevascular lymph nodes in the superior mediastinum are individually up to 8 millimeter short axis. Other mediastinal nodes are stable since 2012 and within normal limits. Hilar lymph nodes not well evaluated in the absence of contrast. Lungs/Pleura: Moderate to severe chronic centrilobular emphysema. Major airways are patent. Respiratory motion artifact most pronounced in the right lower lung. No acute right lung opacity other than perhaps mildly increased peribronchial thickening since 2012. Confluent abnormal in the anterior and posterior left upper  lobe opacity in the form of consolidation and nodular septal thickening amidst severe emphysema. Possible cavitation demonstrated on series 4, image 37. Consolidation continues into the superior segment of the left lower lobe and also the medial basal segment of the left lower lobe with air bronchograms. No definite cavitation in the left lower lobe. Central left airways remain patent. Superimposed mild reticulonodular distal peribronchial opacity in the lingula. No pleural effusion. Upper Abdomen: Negative visible noncontrast liver, spleen, pancreas, adrenal glands, kidneys, and bowel in the upper abdomen. Musculoskeletal: Osteopenia. No acute osseous abnormality identified. IMPRESSION: 1. Severe Emphysema (ICD10-J43.9) with superimposed Multilobar Left Lung Pneumonia and suspicion of cavitation/necrosis in the left upper lobe. Consolidated superior and medial basal segments of the left lower lobe. Possible early infection in the lingula. No pleural effusion. 2. Reactive appearing superior mediastinal lymph nodes. 3. No acute findings in the right lung. 4. Calcified coronary artery atherosclerosis. Electronically Signed   By: Odessa FlemingH  Hall M.D.   On: 02/01/2019 23:29   Dg Chest Portable 1 View  Result Date: 02/01/2019 CLINICAL DATA:  63 year old male with respiratory failure,  negative for COVID-19. X3 over the course of this month. EXAM: PORTABLE CHEST 1 VIEW COMPARISON:  01/26/2019 and earlier. FINDINGS: Portable AP upright view at 1912 hours. Right PICC line has been removed. Continued coarse and confluent opacity in the left upper lobe, progressed compared to 01/26/2019. Superimposed pulmonary hyperinflation and evidence of emphysema. No pneumothorax or pleural effusion. Normal cardiac size and mediastinal contours. Visualized tracheal air column is within normal limits. IMPRESSION: Chronic lung disease with unresolved confluent left upper lobe opacity which is mildly progressed since 01/26/2019. This was first demonstrated on 01/09/2019, and was new from 12/09/2018 arguing against neoplasm and favoring an infectious/inflammatory etiology. No pleural effusion or new cardiopulmonary abnormality. Electronically Signed   By: Odessa FlemingH  Hall M.D.   On: 02/01/2019 19:30     Scheduled Meds: . chlorhexidine  15 mL Mouth Rinse BID  . Chlorhexidine Gluconate Cloth  6 each Topical Q0600  . fluticasone  2 spray Each Nare Daily  . fluticasone furoate-vilanterol  1 puff Inhalation Daily   And  . umeclidinium bromide  1 puff Inhalation Daily  . guaiFENesin  1,200 mg Oral BID  . heparin  5,000 Units Subcutaneous Q8H  . insulin aspart  0-5 Units Subcutaneous QHS  . insulin aspart  0-9 Units Subcutaneous TID WC  . ipratropium-albuterol  3 mL Inhalation QID  . loratadine  10 mg Oral Daily  . mouth rinse  15 mL Mouth Rinse q12n4p  . montelukast  10 mg Oral q morning - 10a  . multivitamin with minerals  1 tablet Oral Daily  . oxybutynin  5 mg Oral BID  . pantoprazole  40 mg Oral q morning - 10a  . predniSONE  60 mg Oral Q breakfast  . rosuvastatin  40 mg Oral QHS  . sodium chloride flush  10-40 mL Intracatheter Q12H   Continuous Infusions:  Active Problems:   COPD exacerbation (HCC)   DM (diabetes mellitus) (HCC)   GERD (gastroesophageal reflux disease)   Anxiety   On home O2    Acute on chronic respiratory failure (HCC)   Critical careTime spent: 31 mins  Standley Dakinslanford Trask Vosler, MD [facility] 02/02/2019, 4:29 PM    LOS: 0 days  How to contact the [facility] Attending or Consulting provider 7A - 7P or covering provider during after hours 7P -7A, for this patient?  1. Check the care team in [facility] and look  for a) attending/consulting [place] provider listed and b) the [facility] team listed 2. Log into [email or web address] and use [place]'s universal password to access. If you do not have the password, please contact the hospital operator. 3. Locate the [facility] provider you are looking for under [facility] and page to a number that you can be directly reached. 4. If you still have difficulty reaching the provider, please page the [facility] (Director on Call) for the Hospitalists listed on amion for assistance.

## 2019-02-02 NOTE — Care Management Obs Status (Signed)
MEDICARE OBSERVATION STATUS NOTIFICATION   Patient Details  Name: Jeff Ray MRN: [id number] Date of Birth: 1956-03-30   Medicare Observation Status Notification Given:  Other (see comment)    Corey Harold 02/02/2019, 3:40 PM

## 2019-02-02 NOTE — TOC Progression Note (Signed)
Transition of Care [facility]) - Progression Note    Patient Details  Name: MARCH STEYER MRN: [id number] Date of Birth: [birth year]  Transition of Care [facility]) CM/SW Contact  Shade Flood, LCSW Phone Number: 02/02/2019, 11:44 AM  Clinical Narrative:     Pt returned to the hospital three hours after dc yesterday. Pt reports that he was anxious and having trouble breathing.  Met with pt and discussed with MD as well as Blake Divine from [place]. Plan is for dc home today. Bipap to be delivered at 4:00pm today. Spoke with pt's wife by phone this AM to update. Discussed strategies to help manage pt's needs at home. She expressed appreciation. She will come and pick up pt at dc.  Pt will follow up with PCP and Dr. Luan Pulling as previously arranged.     1:42 Per MD, pt will now need to remain hospitalized as his blood sugars are a concern. Updated pt's wife. Updated MD that bipap can only be delivered M-F. Will follow up Monday.    Expected Discharge Plan and Services                                                 Social Determinants of Health (SDOH) Interventions    Readmission Risk Interventions Readmission Risk Prevention Plan 01/26/2019 01/18/2019  Transportation Screening Complete Complete  HRI or [place] Complete Complete  Social Work Consult for [place] Planning/Counseling Complete Not Complete  SW consult not completed comments - NA  Palliative Care Screening Not Applicable Not Applicable  Palliative Care Screening Not Complete Comments - NA  Medication Review (RN Care Manager) - Complete  Some recent data might be hidden

## 2019-02-03 ENCOUNTER — Encounter (HOSPITAL_COMMUNITY): Payer: Self-pay

## 2019-02-03 DIAGNOSIS — J9622 Acute and chronic respiratory failure with hypercapnia: Secondary | ICD-10-CM

## 2019-02-03 LAB — GLUCOSE, CAPILLARY
Glucose-Capillary: 117 mg/dL — ABNORMAL HIGH (ref 70–99)
Glucose-Capillary: 148 mg/dL — ABNORMAL HIGH (ref 70–99)
Glucose-Capillary: 159 mg/dL — ABNORMAL HIGH (ref 70–99)
Glucose-Capillary: 179 mg/dL — ABNORMAL HIGH (ref 70–99)
Glucose-Capillary: 194 mg/dL — ABNORMAL HIGH (ref 70–99)
Glucose-Capillary: 207 mg/dL — ABNORMAL HIGH (ref 70–99)
Glucose-Capillary: 252 mg/dL — ABNORMAL HIGH (ref 70–99)
Glucose-Capillary: 255 mg/dL — ABNORMAL HIGH (ref 70–99)

## 2019-02-03 NOTE — Progress Notes (Signed)
PROGRESS NOTE    Jeff Ray  JOI:[id number]  DOB: [birth year]  DOA: 02/01/2019 PCP: Gareth Morgan, MD   Brief Admission Hx: 63 y/o male with end stage COPD just discharged yesterday after being treated with acute on chronic respiratory failure/COPD exacerbation returned after ED became more short of breath and anxious at home.  His BiPAP was not delivered to his home and he did not have it available at home.  The plan was to discharge him home when his BiPAP was available at home.  MDM/Assessment & Plan:   1. Acute on chronic respiratory failure with hypoxia - Pt says he feels better.  When he uses his bipap his symptoms remain fairly well controlled.  He will have a bipap delivered to his home on 5/4 and then we will discharge him on 5/4 because he needs to be present when it is delivered. Continue current treatments for now.  2. COPD exacerbation - Pt has severe COPD.  HE is being managed with prednisone and bronchodilators and bipap.  3. Type 2 DM with hyperglycemia - supplemental sliding scale coverage ordered and CBG is controlled. No further hypoglycemia.  4. GAD - he uses alprazolam as needed for symptoms.  5. Hyperlipidemia - continue home statin therapy.    DVT prophylaxis: lovenox  Code Status: full  Family Communication: wife telephone  Disposition Plan: dc home 5/4 when home bipap arranged   Subjective: Pt says he feels much better today.  He uses bipap at night and sometimes during day. He denies SOB or chest pain.   Objective: Vitals:   02/03/19 1400 02/03/19 1500 02/03/19 1600 02/03/19 1619  BP: 109/64 108/66 110/68   Pulse: (!) 112 (!) 106 (!) 101   Resp: (!) 25  17   Temp:    97.7 F (36.5 C)  TempSrc:    Oral  SpO2: (!) 87%  96%   Weight:      Height:        Intake/Output Summary (Last 24 hours) at 02/03/2019 1627 Last data filed at 02/03/2019 1500 Gross per 24 hour  Intake 260 ml  Output 1700 ml  Net -1440 ml   Filed Weights   02/01/19 1854  02/03/19 0413  Weight: 73.1 kg 75.4 kg     REVIEW OF SYSTEMS  As per history otherwise all reviewed and reported negative  Exam:  General exam: off bipap on  sitting in bed, NAD.  Respiratory system: BBS poor air movement. No increased work of breathing. Cardiovascular system: S1 & S2 heard. No JVD, murmurs, gallops, clicks or pedal edema. Gastrointestinal system: Abdomen is nondistended, soft and nontender. Normal bowel sounds heard. Central nervous system: Alert and oriented. No focal neurological deficits. Extremities: no CCE.  Data Reviewed: Basic Metabolic Panel: Recent Labs  Lab 01/28/19 0640 01/29/19 0513 01/30/19 0410 02/01/19 1937 02/02/19 0509  NA 133* 137 136 139 139  K 4.3 4.0 4.1 4.2 4.7  CL 98 102 101 98 102  CO2 30 29 28 31 28   GLUCOSE 301* 98 158* 69* 66*  BUN 24* 23 24* 22 21  CREATININE 0.90 0.79 0.74 1.01 0.82  CALCIUM 9.1 9.3 9.2 9.8 9.0   Liver Function Tests: Recent Labs  Lab 01/28/19 0640 01/29/19 0513 01/30/19 0410  AST 17 16 17   ALT 32 30 33  ALKPHOS 122 116 105  BILITOT 0.3 0.3 0.3  PROT 5.7* 5.5* 5.5*  ALBUMIN 1.9* 1.9* 1.8*   No results for input(s): LIPASE, AMYLASE in the last  168 hours. No results for input(s): AMMONIA in the last 168 hours. CBC: Recent Labs  Lab 01/28/19 0640 01/29/19 0513 01/30/19 0410 02/01/19 1937 02/02/19 0509  WBC 25.1* 24.5* 25.5* 26.7* 24.3*  NEUTROABS 24.0* 23.2* 24.3*  --   --   HGB 9.3* 9.3* 8.8* 12.5* 10.1*  HCT 29.7* 30.6* 29.3* 41.7 34.0*  MCV 86.3 87.9 88.5 89.7 90.2  PLT 286 285 249 242 175   Cardiac Enzymes: No results for input(s): CKTOTAL, CKMB, CKMBINDEX, TROPONINI in the last 168 hours. CBG (last 3)  Recent Labs    02/03/19 0948 02/03/19 1155 02/03/19 1620  GLUCAP 148* 179* 252*   Recent Results (from the past 240 hour(s))  Blood Culture (routine x 2)     Status: None   Collection Time: 01/25/19  7:58 AM  Result Value Ref Range Status   Specimen Description RIGHT  ANTECUBITAL  Final   Special Requests   Final    BOTTLES DRAWN AEROBIC AND ANAEROBIC Blood Culture adequate volume   Culture   Final    NO GROWTH 5 DAYS Performed at [facility], [address] [place], [place] [address]    Report Status 01/30/2019 FINAL  Final  Blood Culture (routine x 2)     Status: None   Collection Time: 01/25/19  9:01 AM  Result Value Ref Range Status   Specimen Description RIGHT ANTECUBITAL  Final   Special Requests   Final    BOTTLES DRAWN AEROBIC AND ANAEROBIC Blood Culture adequate volume   Culture   Final    NO GROWTH 5 DAYS Performed at [facility], [address] [place], [place] [address]    Report Status 01/30/2019 FINAL  Final  SARS Coronavirus 2 [facility](Hospital order, Performed in [facility] hospital lab)     Status: None   Collection Time: 01/25/19 10:51 AM  Result Value Ref Range Status   SARS Coronavirus 2 NEGATIVE NEGATIVE Final    Comment: (NOTE) If result is NEGATIVE SARS-CoV-2 target nucleic acids are NOT DETECTED. The SARS-CoV-2 RNA is generally detectable in upper and lower  respiratory specimens during the acute phase of infection. The lowest  concentration of SARS-CoV-2 viral copies this assay can detect is 250  copies / mL. A negative result does not preclude SARS-CoV-2 infection  and should not be used as the sole basis for treatment or other  patient management decisions.  A negative result may occur with  improper specimen collection / handling, submission of specimen other  than nasopharyngeal swab, presence of viral mutation(s) within the  areas targeted by this assay, and inadequate number of viral copies  (<250 copies / mL). A negative result must be combined with clinical  observations, patient history, and epidemiological information. If result is POSITIVE SARS-CoV-2 target nucleic acids are DETECTED. The SARS-CoV-2 RNA is generally detectable in upper and lower  respiratory specimens dur ing the acute phase of infection.   Positive  results are indicative of active infection with SARS-CoV-2.  Clinical  correlation with patient history and other diagnostic information is  necessary to determine patient infection status.  Positive results do  not rule out bacterial infection or co-infection with other viruses. If result is PRESUMPTIVE POSTIVE SARS-CoV-2 nucleic acids MAY BE PRESENT.   A presumptive positive result was obtained on the submitted specimen  and confirmed on repeat testing.  While 2019 novel coronavirus  (SARS-CoV-2) nucleic acids may be present in the submitted sample  additional confirmatory testing may be necessary for epidemiological  and / or clinical management purposes  to differentiate between  SARS-CoV-2 and other Sarbecovirus currently known to infect humans.  If clinically indicated additional testing with an alternate test  methodology [phone]) is advised. The SARS-CoV-2 RNA is generally  detectable in upper and lower respiratory sp ecimens during the acute  phase of infection. The expected result is Negative. Fact Sheet for Patients:  BoilerBrush.com.cy Fact Sheet for Healthcare Providers: https://pope.com/ This test is not yet approved or cleared by the [place] FDA and has been authorized for detection and/or diagnosis of SARS-CoV-2 by FDA under an Emergency Use Authorization (EUA).  This EUA will remain in effect (meaning this test can be used) for the duration of the COVID-19 declaration under Section 564(b)(1) of the Act, 21 U.S.C. section 360bbb-3(b)(1), unless the authorization is terminated or revoked sooner. Performed at [facility], [address], [place], [place] [address]   Respiratory Panel by PCR     Status: None   Collection Time: 01/25/19 12:32 PM  Result Value Ref Range Status   Adenovirus NOT DETECTED NOT DETECTED Final   Coronavirus 229E NOT DETECTED NOT DETECTED Final    Comment: (NOTE) The Coronavirus  on the Respiratory Panel, DOES NOT test for the novel  Coronavirus (2019 nCoV)    Coronavirus HKU1 NOT DETECTED NOT DETECTED Final   Coronavirus NL63 NOT DETECTED NOT DETECTED Final   Coronavirus OC43 NOT DETECTED NOT DETECTED Final   Metapneumovirus NOT DETECTED NOT DETECTED Final   Rhinovirus / Enterovirus NOT DETECTED NOT DETECTED Final   Influenza A NOT DETECTED NOT DETECTED Final   Influenza B NOT DETECTED NOT DETECTED Final   Parainfluenza Virus 1 NOT DETECTED NOT DETECTED Final   Parainfluenza Virus 2 NOT DETECTED NOT DETECTED Final   Parainfluenza Virus 3 NOT DETECTED NOT DETECTED Final   Parainfluenza Virus 4 NOT DETECTED NOT DETECTED Final   Respiratory Syncytial Virus NOT DETECTED NOT DETECTED Final   Bordetella pertussis NOT DETECTED NOT DETECTED Final   Chlamydophila pneumoniae NOT DETECTED NOT DETECTED Final   Mycoplasma pneumoniae NOT DETECTED NOT DETECTED Final    Comment: Performed at [facility], [address], [place], [place] [address]     Studies: Ct Chest Wo Contrast  Result Date: 02/01/2019 CLINICAL DATA:  63 year old male with respiratory failure, unresolved left upper lobe opacity since 01/09/2019. Negative for COVID-19 X3 over the course of this month. EXAM: CT CHEST WITHOUT CONTRAST TECHNIQUE: Multidetector CT imaging of the chest was performed following the standard protocol without IV contrast. COMPARISON:  Portable chest earlier today, and earlier. Chest CT 06/03/2011. FINDINGS: Cardiovascular: No cardiomegaly or pericardial effusion. Calcified coronary artery atherosclerosis and/or stents. Lesser Calcified aortic atherosclerosis. Vascular patency is not evaluated in the absence of IV contrast. Mediastinum/Nodes: Reactive appearing prevascular lymph nodes in the superior mediastinum are individually up to 8 millimeter short axis. Other mediastinal nodes are stable since 2012 and within normal limits. Hilar lymph nodes not well evaluated in the absence of  contrast. Lungs/Pleura: Moderate to severe chronic centrilobular emphysema. Major airways are patent. Respiratory motion artifact most pronounced in the right lower lung. No acute right lung opacity other than perhaps mildly increased peribronchial thickening since 2012. Confluent abnormal in the anterior and posterior left upper lobe opacity in the form of consolidation and nodular septal thickening amidst severe emphysema. Possible cavitation demonstrated on series 4, image 37. Consolidation continues into the superior segment of the left lower lobe and also the medial basal segment of the left  lower lobe with air bronchograms. No definite cavitation in the left lower lobe. Central left airways remain patent. Superimposed mild reticulonodular distal peribronchial opacity in the lingula. No pleural effusion. Upper Abdomen: Negative visible noncontrast liver, spleen, pancreas, adrenal glands, kidneys, and bowel in the upper abdomen. Musculoskeletal: Osteopenia. No acute osseous abnormality identified. IMPRESSION: 1. Severe Emphysema (ICD10-J43.9) with superimposed Multilobar Left Lung Pneumonia and suspicion of cavitation/necrosis in the left upper lobe. Consolidated superior and medial basal segments of the left lower lobe. Possible early infection in the lingula. No pleural effusion. 2. Reactive appearing superior mediastinal lymph nodes. 3. No acute findings in the right lung. 4. Calcified coronary artery atherosclerosis. Electronically Signed   By: Odessa Fleming M.D.   On: 02/01/2019 23:29   Dg Chest Portable 1 View  Result Date: 02/01/2019 CLINICAL DATA:  63 year old male with respiratory failure, negative for COVID-19. X3 over the course of this month. EXAM: PORTABLE CHEST 1 VIEW COMPARISON:  01/26/2019 and earlier. FINDINGS: Portable AP upright view at 1912 hours. Right PICC line has been removed. Continued coarse and confluent opacity in the left upper lobe, progressed compared to 01/26/2019. Superimposed  pulmonary hyperinflation and evidence of emphysema. No pneumothorax or pleural effusion. Normal cardiac size and mediastinal contours. Visualized tracheal air column is within normal limits. IMPRESSION: Chronic lung disease with unresolved confluent left upper lobe opacity which is mildly progressed since 01/26/2019. This was first demonstrated on 01/09/2019, and was new from 12/09/2018 arguing against neoplasm and favoring an infectious/inflammatory etiology. No pleural effusion or new cardiopulmonary abnormality. Electronically Signed   By: Odessa Fleming M.D.   On: 02/01/2019 19:30     Scheduled Meds: . chlorhexidine  15 mL Mouth Rinse BID  . Chlorhexidine Gluconate Cloth  6 each Topical Q0600  . fluticasone  2 spray Each Nare Daily  . fluticasone furoate-vilanterol  1 puff Inhalation Daily   And  . umeclidinium bromide  1 puff Inhalation Daily  . guaiFENesin  1,200 mg Oral BID  . heparin  5,000 Units Subcutaneous Q8H  . insulin aspart  0-5 Units Subcutaneous QHS  . insulin aspart  0-9 Units Subcutaneous TID WC  . ipratropium-albuterol  3 mL Inhalation Q4H  . loratadine  10 mg Oral Daily  . mouth rinse  15 mL Mouth Rinse q12n4p  . montelukast  10 mg Oral q morning - 10a  . multivitamin with minerals  1 tablet Oral Daily  . oxybutynin  5 mg Oral BID  . pantoprazole  40 mg Oral q morning - 10a  . predniSONE  60 mg Oral Q breakfast  . rosuvastatin  40 mg Oral QHS  . sodium chloride flush  10-40 mL Intracatheter Q12H   Continuous Infusions:  Active Problems:   COPD exacerbation (HCC)   DM (diabetes mellitus) (HCC)   GERD (gastroesophageal reflux disease)   Anxiety   Acute and chronic respiratory failure (acute-on-chronic) (HCC)   On home O2   Acute on chronic respiratory failure (HCC)   Time spent:   Standley Dakins, MD [facility] 02/03/2019, 4:27 PM    LOS: 1 day  How to contact the [facility] Attending or Consulting provider 7A - 7P or covering provider during after hours 7P  -7A, for this patient?  1. Check the care team in [facility] and look for a) attending/consulting TRH provider listed and b) the [facility] team listed 2. Log into [email or web address] and use [place]'s universal password to access. If you do not have the password, please contact the hospital  operator. 3. Locate the [facility] provider you are looking for under [facility] and page to a number that you can be directly reached. 4. If you still have difficulty reaching the provider, please page the [facility] (Director on Call) for the Hospitalists listed on amion for assistance.

## 2019-02-04 LAB — GLUCOSE, CAPILLARY
Glucose-Capillary: 130 mg/dL — ABNORMAL HIGH (ref 70–99)
Glucose-Capillary: 82 mg/dL (ref 70–99)
Glucose-Capillary: 227 mg/dL — ABNORMAL HIGH (ref 70–99)
Glucose-Capillary: 303 mg/dL — ABNORMAL HIGH (ref 70–99)
Glucose-Capillary: 238 mg/dL — ABNORMAL HIGH (ref 70–99)

## 2019-02-04 MED ORDER — INSULIN GLARGINE 100 UNIT/ML ~~LOC~~ SOLN
14.0000 [IU] | Freq: Every day | SUBCUTANEOUS | Status: DC
  Administered 2019-02-04: 14 [IU] via SUBCUTANEOUS
  Filled 2019-02-04 (×2): qty 0.14

## 2019-02-04 NOTE — Progress Notes (Signed)
PROGRESS NOTE KOA BIDERMAN  GHW:[id number]  DOB: May 09, 1956  DOA: 02/01/2019 PCP: Gareth Morgan, MD  Brief Admission Hx: 63 y/o male with end stage COPD just discharged yesterday after being treated with acute on chronic respiratory failure/COPD exacerbation returned after ED became more short of breath and anxious at home.  His BiPAP was not delivered to his home and he did not have it available at home.  The plan was to discharge him home when his BiPAP was available at home.  MDM/Assessment & Plan:   1. Acute on chronic respiratory failure with hypoxia - Pt says he feels better.  When he uses his bipap his symptoms remain fairly well controlled.  He will have a bipap delivered to his home on 5/4 and then we will discharge him on 5/4 because he needs to be present when it is delivered. Continue current treatments for now.  2. COPD exacerbation - Pt has severe COPD.  He is being managed with prednisone and bronchodilators and bipap.  3. Type 2 DM with hyperglycemia - supplemental sliding scale coverage ordered and CBG is controlled. No further hypoglycemia.  4. GAD - he uses alprazolam as needed for symptoms.  5. Hyperlipidemia - continue home statin therapy.    DVT prophylaxis: lovenox  Code Status: full  Family Communication: wife telephone  Disposition Plan: dc home 5/4 when home bipap arranged   Subjective: Pt says he has chest pain symptoms with coughing sometimes and taking a deep breath, he has been having this for several weeks he reports.  He says that he treats with ibuprofen at home and it resolves the symptoms.  Objective: Vitals:   02/04/19 0900 02/04/19 1000 02/04/19 1100 02/04/19 1101  BP: (!) 96/56 117/78 (!) 105/59   Pulse: (!) 122 (!) 102 (!) 102   Resp: (!) 24 (!) 26 18   Temp:    98.6 F (37 C)  TempSrc:    Oral  SpO2: 94% 93% 95%   Weight:      Height:        Intake/Output Summary (Last 24 hours) at 02/04/2019 1138 Last data filed at 02/04/2019 7893 Gross  per 24 hour  Intake 260 ml  Output 1650 ml  Net -1390 ml   Filed Weights   02/01/19 1854 02/03/19 0413 02/04/19 0500  Weight: 73.1 kg 75.4 kg 75.8 kg     REVIEW OF SYSTEMS  As per history otherwise all reviewed and reported negative  Exam:  General exam: off bipap on [place] sitting in bed, NAD.  Respiratory system: BBS poor air movement, slightly improved. No increased work of breathing. Cardiovascular system: S1 & S2 heard. No JVD, murmurs, gallops, clicks or pedal edema. Gastrointestinal system: Abdomen is nondistended, soft and nontender. Normal bowel sounds heard. Central nervous system: Alert and oriented. No focal neurological deficits. Extremities: no CCE.  Data Reviewed: Basic Metabolic Panel: Recent Labs  Lab 01/29/19 0513 01/30/19 0410 02/01/19 1937 02/02/19 0509  NA 137 136 139 139  K 4.0 4.1 4.2 4.7  CL 102 101 98 102  CO2 29 28 31 28   GLUCOSE 98 158* 69* 66*  BUN 23 24* 22 21  CREATININE 0.79 0.74 1.01 0.82  CALCIUM 9.3 9.2 9.8 9.0   Liver Function Tests: Recent Labs  Lab 01/29/19 0513 01/30/19 0410  AST 16 17  ALT 30 33  ALKPHOS 116 105  BILITOT 0.3 0.3  PROT 5.5* 5.5*  ALBUMIN 1.9* 1.8*   No results for input(s): LIPASE, AMYLASE in  the last 168 hours. No results for input(s): AMMONIA in the last 168 hours. CBC: Recent Labs  Lab 01/29/19 0513 01/30/19 0410 02/01/19 1937 02/02/19 0509  WBC 24.5* 25.5* 26.7* 24.3*  NEUTROABS 23.2* 24.3*  --   --   HGB 9.3* 8.8* 12.5* 10.1*  HCT 30.6* 29.3* 41.7 34.0*  MCV 87.9 88.5 89.7 90.2  PLT 285 249 242 175   Cardiac Enzymes: No results for input(s): CKTOTAL, CKMB, CKMBINDEX, TROPONINI in the last 168 hours. CBG (last 3)  Recent Labs    02/04/19 0240 02/04/19 0754 02/04/19 1059  GLUCAP 130* 82 227*   Recent Results (from the past 240 hour(s))  Respiratory Panel by PCR     Status: None   Collection Time: 01/25/19 12:32 PM  Result Value Ref Range Status   Adenovirus NOT DETECTED NOT  DETECTED Final   Coronavirus 229E NOT DETECTED NOT DETECTED Final    Comment: (NOTE) The Coronavirus on the Respiratory Panel, DOES NOT test for the novel  Coronavirus (2019 nCoV)    Coronavirus HKU1 NOT DETECTED NOT DETECTED Final   Coronavirus NL63 NOT DETECTED NOT DETECTED Final   Coronavirus OC43 NOT DETECTED NOT DETECTED Final   Metapneumovirus NOT DETECTED NOT DETECTED Final   Rhinovirus / Enterovirus NOT DETECTED NOT DETECTED Final   Influenza A NOT DETECTED NOT DETECTED Final   Influenza B NOT DETECTED NOT DETECTED Final   Parainfluenza Virus 1 NOT DETECTED NOT DETECTED Final   Parainfluenza Virus 2 NOT DETECTED NOT DETECTED Final   Parainfluenza Virus 3 NOT DETECTED NOT DETECTED Final   Parainfluenza Virus 4 NOT DETECTED NOT DETECTED Final   Respiratory Syncytial Virus NOT DETECTED NOT DETECTED Final   Bordetella pertussis NOT DETECTED NOT DETECTED Final   Chlamydophila pneumoniae NOT DETECTED NOT DETECTED Final   Mycoplasma pneumoniae NOT DETECTED NOT DETECTED Final    Comment: Performed at [facility], [address] [place], [place] [address]     Studies: No results found.   Scheduled Meds: . chlorhexidine  15 mL Mouth Rinse BID  . Chlorhexidine Gluconate Cloth  6 each Topical Q0600  . fluticasone  2 spray Each Nare Daily  . fluticasone furoate-vilanterol  1 puff Inhalation Daily   And  . umeclidinium bromide  1 puff Inhalation Daily  . guaiFENesin  1,200 mg Oral BID  . heparin  5,000 Units Subcutaneous Q8H  . insulin aspart  0-5 Units Subcutaneous QHS  . insulin aspart  0-9 Units Subcutaneous TID WC  . ipratropium-albuterol  3 mL Inhalation Q4H  . loratadine  10 mg Oral Daily  . mouth rinse  15 mL Mouth Rinse q12n4p  . montelukast  10 mg Oral q morning - 10a  . multivitamin with minerals  1 tablet Oral Daily  . oxybutynin  5 mg Oral BID  . pantoprazole  40 mg Oral q morning - 10a  . predniSONE  60 mg Oral Q breakfast  . rosuvastatin  40 mg Oral QHS   . sodium chloride flush  10-40 mL Intracatheter Q12H   Continuous Infusions:  Active Problems:   COPD exacerbation (HCC)   DM (diabetes mellitus) (HCC)   GERD (gastroesophageal reflux disease)   Anxiety   Acute and chronic respiratory failure (acute-on-chronic) (HCC)   On home O2   Acute on chronic respiratory failure (HCC)  Time spent:   Standley Dakinslanford Harriette Tovey, MD [facility] 02/04/2019, 11:38 AM    LOS: 2 days  How to contact the [facility] Attending or Consulting  provider [place] or covering provider during after hours [place], for this patient?  1. Check the care team in [facility] and look for a) attending/consulting TRH provider listed and b) the [facility] team listed 2. Log into [email or web address] and use [place]'s universal password to access. If you do not have the password, please contact the hospital operator. 3. Locate the [facility] provider you are looking for under [facility] and page to a number that you can be directly reached. 4. If you still have difficulty reaching the provider, please page the [facility] (Director on Call) for the Hospitalists listed on amion for assistance.

## 2019-02-05 ENCOUNTER — Inpatient Hospital Stay (HOSPITAL_COMMUNITY): Payer: Medicare HMO

## 2019-02-05 DIAGNOSIS — I2699 Other pulmonary embolism without acute cor pulmonale: Secondary | ICD-10-CM

## 2019-02-05 LAB — GLUCOSE, CAPILLARY
Glucose-Capillary: 234 mg/dL — ABNORMAL HIGH (ref 70–99)
Glucose-Capillary: 185 mg/dL — ABNORMAL HIGH (ref 70–99)
Glucose-Capillary: 219 mg/dL — ABNORMAL HIGH (ref 70–99)

## 2019-02-05 MED ORDER — APIXABAN 5 MG PO TABS: 5.0000 mg | ORAL_TABLET | Freq: Two times a day (BID) | ORAL | Status: DC

## 2019-02-05 MED ORDER — IOHEXOL 350 MG/ML SOLN
100.0000 mL | Freq: Once | INTRAVENOUS | Status: AC | PRN
  Administered 2019-02-05: 10:00:00 100 mL via INTRAVENOUS

## 2019-02-05 MED ORDER — APIXABAN 5 MG PO TABS
10.0000 mg | ORAL_TABLET | Freq: Two times a day (BID) | ORAL | Status: DC
  Administered 2019-02-05: 10 mg via ORAL
  Filled 2019-02-05: qty 2

## 2019-02-05 MED ORDER — FEXOFENADINE-PSEUDOEPHED ER 180-240 MG PO TB24: 1.0000 | ORAL_TABLET | Freq: Every day | ORAL | Status: AC | PRN

## 2019-02-05 MED ORDER — APIXABAN 5 MG PO TABS: ORAL_TABLET | ORAL | 0 refills | Status: AC

## 2019-02-05 MED ORDER — PREDNISONE 20 MG PO TABS: ORAL_TABLET | ORAL | 0 refills | Status: DC

## 2019-02-05 MED ORDER — INSULIN ASPART 100 UNIT/ML ~~LOC~~ SOLN
3.0000 [IU] | Freq: Three times a day (TID) | SUBCUTANEOUS | Status: DC
  Administered 2019-02-05 (×2): 3 [IU] via SUBCUTANEOUS

## 2019-02-05 MED ORDER — INSULIN GLARGINE 100 UNIT/ML SOLOSTAR PEN: 15.0000 [IU] | PEN_INJECTOR | Freq: Every day | SUBCUTANEOUS | 2 refills | Status: AC

## 2019-02-05 NOTE — Care Management Important Message (Signed)
Important Message  Patient Details  Name: Jeff Ray MRN: [id number] Date of Birth: 13-Dec-1955   Medicare Important Message Given:  Yes    Corey Harold 02/05/2019, 2:28 PM

## 2019-02-05 NOTE — TOC Transition Note (Addendum)
Transition of Care [facility]) - CM/SW Discharge Note   Patient Details  Name: Jeff Ray MRN: [id number] Date of Birth: Feb 23, 1956  Transition of Care [facility]) CM/SW Contact:  Shade Flood, LCSW Phone Number: 02/05/2019, 10:58 AM   Clinical Narrative:     Pt stable for dc today per MD. Blake Divine at [place] says the NIV will be delivered between 5-6pm this evening. Message left for [facility] at [place] to update on pt's dc as HH will follow for RN and PT. Updated MD and RN. Also spoke with pt's wife by phone to update. Pt has established follow up appointments with PCP and Dr. Luan Pulling. There are no other TOC needs for dc.  12:45 Notified by MD that pt will be discharging on Eliquis. Met with pt to provide coupon for first 30 day supply free. Pt put it in his duffel bag.   Final next level of care: [place] Barriers to Discharge: Barriers Resolved   Patient Goals and CMS Choice        Discharge Placement                       Discharge Plan and Services                              Date [facility] Agency Contacted: 02/05/19 Time HH Agency Contacted: 1100    Social Determinants of Health (SDOH) Interventions     Readmission Risk Interventions Readmission Risk Prevention Plan 01/26/2019 01/18/2019  Transportation Screening Complete Complete  HRI or [place] Complete Complete  Social Work Consult for [place] Planning/Counseling Complete Not Complete  SW consult not completed comments - NA  Palliative Care Screening Not Applicable Not Applicable  Palliative Care Screening Not Complete Comments - NA  Medication Review Press photographer) - Complete  Some recent data might be hidden

## 2019-02-05 NOTE — Discharge Summary (Signed)
Physician Discharge Summary  Jeff Ray:[id number] DOB: 08/25/1956 DOA: 02/01/2019  PCP: Gareth Morgan, MD Pulmonologist: Dr. Juanetta Gosling  Admit date: 02/01/2019 Discharge date: 02/05/2019  Admitted From:  Home  Disposition:  Home   Recommendations for Outpatient Follow-up:  1. Follow up with PCP in 1 weeks 2. Follow up with Pulmonology in 1 week 3. Home bipap has been arranged.  Home Health: RN, Respiratory care, Home bipap arranged  Discharge Condition: STABLE   CODE STATUS: FULL    Brief Hospitalization Summary: Please see all hospital notes, images, labs for full details of the hospitalization. Dr. Teena Ray HPI:  Jeff Ray  is a 63 y.o. male, past medical history of end-stage COPD, recent multiple hospitalization and intubation at [facility] earlier this month, and another prolonged hospitalization here at any pain, patient was discharged home this afternoon secondary to acute on chronic hypoxic respiratory failure and COPD exacerbation, patient ports he was doing better when he was discharged home, then report he was anxious, then started to have shortness of breath, with increased work of breathing, which prompted him to call EMS, apparently saturation was 96% by EMS, patient denies fever, chills, chest pain, reports air hunger and dyspnea, denies cough or productive sputum. - in ED patient extremely anxious, tachypneic in the mid 30s, ABG showing no CO2 retention, he comes hypoxic nasal cannula and he is mainly in a mouth breather, and this does resolve with BiPAP, he is extremely anxious, and does not want his BiPAP mask to be removed, chest x-ray showing chronic left upper lung opacity, no significant labs abnormalities, I was called to admit.  Brief Admission Hx: 63 y/o male with end stage COPD just discharged yesterday after being treatedwith acute on chronic respiratory failure/COPD exacerbation returned after ED became more short of breath and anxious at home.  His BiPAP was not delivered to his home and he did not have it available at home. The plan was to discharge him home when his BiPAP was available at home.  MDM/Assessment & Plan:   1. Acute on chronic respiratory failure with hypoxia - Pt says he feels better.  When he uses his bipap his symptoms remain fairly well controlled.  He will have a bipap delivered to his home on 5/4 and then we will discharge him on 5/4 because he needs to be present when it is delivered. Continue current treatments for now.   Because the patient had complained of pleuritic chest pain, CTA was tested and patient was found to have small bilateral PEs.  He has been started on treatment and feeling better. 2. Bilateral pulmonary emboli - Pt was having pleuritic chest wall pain, when we were finally able to get his CTA done, he was found to have small bilateral PEs.  He has been started on apixaban. I asked pharmacy to dose. He was instructed to take apixaban 10 mg BID thru 02/11/19, then 5 mg BID after that.  He should be treated for at least 3 months.  Bleeding precautions have been given to patient.  He should follow up with his PCP and pulmonologist in 1 week.  3. COPD exacerbation - Pt has severe COPD.  He is being managed with prednisone and bronchodilators and bipap.   He was given a prednisone taper - 40 mg daily x 5 followed by 20 mg daily x 5.   4. Type 2 DM with hyperglycemia - supplemental sliding scale coverage ordered and CBG is controlled. No further hypoglycemia.  Discontinued  glimepiride, continue metformin and reduced lantus to 15 units.  Monitor blood sugar closely and further reduce insulin if he has any recurrent blood sugar less than 100.  5. GAD - he uses alprazolam as needed for symptoms.  6. Hyperlipidemia - continue home statin therapy.    DVT prophylaxis: apixaban Code Status: full  Family Communication: wife telephone  Disposition Plan:  home 5/4 with home bipap, home health   Discharge  Diagnoses:  Active Problems:   COPD exacerbation (HCC)   DM (diabetes mellitus) (HCC)   GERD (gastroesophageal reflux disease)   Anxiety   Acute and chronic respiratory failure (acute-on-chronic) (HCC)   On home O2   Acute on chronic respiratory failure (HCC)   Bilateral pulmonary embolism [facility])  Discharge Instructions: Discharge Instructions    Call MD for:  difficulty breathing, headache or visual disturbances   Complete by:  As directed    Call MD for:  extreme fatigue   Complete by:  As directed    Call MD for:  persistant dizziness or light-headedness   Complete by:  As directed    Increase activity slowly   Complete by:  As directed      Allergies as of 02/05/2019   No Known Allergies     Medication List    STOP taking these medications   glimepiride 2 MG tablet Commonly known as:  AMARYL     TAKE these medications   ALPRAZolam 1 MG tablet Commonly known as:  XANAX Take 0.5 tablets (0.5 mg total) by mouth 2 (two) times daily as needed for anxiety.   apixaban 5 MG Tabs tablet Commonly known as:  ELIQUIS Take 2 tabs po BID thru 5/10, then 1 po BID starting 5/11   CENTRUM SILVER 50+MEN PO Take 1 tablet by mouth daily.   fexofenadine-pseudoephedrine 180-240 MG 24 hr tablet Commonly known as:  ALLEGRA-D 24 Take 1 tablet by mouth daily as needed (allergies). What changed:    when to take this  reasons to take this   fluticasone 50 MCG/ACT nasal spray Commonly known as:  FLONASE Place 2 sprays into both nostrils daily.   Guaifenesin 1200 MG Tb12 Commonly known as:  Mucinex Maximum Strength Take 1 tablet (1,200 mg total) by mouth 2 (two) times daily.   Insulin Glargine 100 UNIT/ML Solostar Pen Commonly known as:  LANTUS Inject 15 Units into the skin at bedtime. What changed:    how much to take  when to take this   ipratropium-albuterol 0.5-2.5 (3) MG/3ML Soln Commonly known as:  DUONEB Take 3 mLs by nebulization every 4 (four) hours as needed.  For shortness of breath   metFORMIN 500 MG 24 hr tablet Commonly known as:  GLUCOPHAGE-XR Take 2 tablets (1,000 mg total) by mouth 2 (two) times daily.   montelukast 10 MG tablet Commonly known as:  SINGULAIR Take 10 mg by mouth every morning.   oxybutynin 5 MG tablet Commonly known as:  DITROPAN Take 1 tablet (5 mg total) by mouth 2 (two) times daily.   pantoprazole 40 MG tablet Commonly known as:  PROTONIX Take 40 mg by mouth every morning.   predniSONE 20 MG tablet Commonly known as:  DELTASONE Take 2 tabs QAM x 5 days, then 1 tab QAM x 5 days What changed:  additional instructions   ProAir HFA 108 (90 Base) MCG/ACT inhaler Generic drug:  albuterol Inhale 2 puffs into the lungs every 6 (six) hours as needed for wheezing or shortness of breath. Shortness of  Breath   rosuvastatin 40 MG tablet Commonly known as:  CRESTOR Take 40 mg by mouth at bedtime.   Trelegy Ellipta 100-62.5-25 MCG/INH Aepb Generic drug:  Fluticasone-Umeclidin-Vilant Take 2 puffs by mouth daily.      Follow-up Information    Gareth Morgan, MD. Schedule an appointment as soon as possible for a visit in 1 week(s).   Specialty:  Family Medicine Contact information: [address] [place] [address] [phone]        Kari Baars, MD. Schedule an appointment as soon as possible for a visit in 1 week(s).   Specialty:  Pulmonary Disease Why:  Hospital Follow Up Contact information: [address] [place] [address] [phone]          No Known Allergies Allergies as of 02/05/2019   No Known Allergies     Medication List    STOP taking these medications   glimepiride 2 MG tablet Commonly known as:  AMARYL     TAKE these medications   ALPRAZolam 1 MG tablet Commonly known as:  XANAX Take 0.5 tablets (0.5 mg total) by mouth 2 (two) times daily as needed for anxiety.   apixaban 5 MG Tabs tablet Commonly known as:  ELIQUIS Take 2 tabs po BID thru 5/10, then  1 po BID starting 5/11   CENTRUM SILVER 50+MEN PO Take 1 tablet by mouth daily.   fexofenadine-pseudoephedrine 180-240 MG 24 hr tablet Commonly known as:  ALLEGRA-D 24 Take 1 tablet by mouth daily as needed (allergies). What changed:    when to take this  reasons to take this   fluticasone 50 MCG/ACT nasal spray Commonly known as:  FLONASE Place 2 sprays into both nostrils daily.   Guaifenesin 1200 MG Tb12 Commonly known as:  Mucinex Maximum Strength Take 1 tablet (1,200 mg total) by mouth 2 (two) times daily.   Insulin Glargine 100 UNIT/ML Solostar Pen Commonly known as:  LANTUS Inject 15 Units into the skin at bedtime. What changed:    how much to take  when to take this   ipratropium-albuterol 0.5-2.5 (3) MG/3ML Soln Commonly known as:  DUONEB Take 3 mLs by nebulization every 4 (four) hours as needed. For shortness of breath   metFORMIN 500 MG 24 hr tablet Commonly known as:  GLUCOPHAGE-XR Take 2 tablets (1,000 mg total) by mouth 2 (two) times daily.   montelukast 10 MG tablet Commonly known as:  SINGULAIR Take 10 mg by mouth every morning.   oxybutynin 5 MG tablet Commonly known as:  DITROPAN Take 1 tablet (5 mg total) by mouth 2 (two) times daily.   pantoprazole 40 MG tablet Commonly known as:  PROTONIX Take 40 mg by mouth every morning.   predniSONE 20 MG tablet Commonly known as:  DELTASONE Take 2 tabs QAM x 5 days, then 1 tab QAM x 5 days What changed:  additional instructions   ProAir HFA 108 (90 Base) MCG/ACT inhaler Generic drug:  albuterol Inhale 2 puffs into the lungs every 6 (six) hours as needed for wheezing or shortness of breath. Shortness of Breath   rosuvastatin 40 MG tablet Commonly known as:  CRESTOR Take 40 mg by mouth at bedtime.   Trelegy Ellipta 100-62.5-25 MCG/INH Aepb Generic drug:  Fluticasone-Umeclidin-Vilant Take 2 puffs by mouth daily.       Procedures/Studies: Ct Chest Wo Contrast  Result Date:  02/01/2019 CLINICAL DATA:  63 year old male with respiratory failure, unresolved left upper lobe opacity since 01/09/2019. Negative for COVID-19 X3 over  the course of this month. EXAM: CT CHEST WITHOUT CONTRAST TECHNIQUE: Multidetector CT imaging of the chest was performed following the standard protocol without IV contrast. COMPARISON:  Portable chest earlier today, and earlier. Chest CT 06/03/2011. FINDINGS: Cardiovascular: No cardiomegaly or pericardial effusion. Calcified coronary artery atherosclerosis and/or stents. Lesser Calcified aortic atherosclerosis. Vascular patency is not evaluated in the absence of IV contrast. Mediastinum/Nodes: Reactive appearing prevascular lymph nodes in the superior mediastinum are individually up to 8 millimeter short axis. Other mediastinal nodes are stable since 2012 and within normal limits. Hilar lymph nodes not well evaluated in the absence of contrast. Lungs/Pleura: Moderate to severe chronic centrilobular emphysema. Major airways are patent. Respiratory motion artifact most pronounced in the right lower lung. No acute right lung opacity other than perhaps mildly increased peribronchial thickening since 2012. Confluent abnormal in the anterior and posterior left upper lobe opacity in the form of consolidation and nodular septal thickening amidst severe emphysema. Possible cavitation demonstrated on series 4, image 37. Consolidation continues into the superior segment of the left lower lobe and also the medial basal segment of the left lower lobe with air bronchograms. No definite cavitation in the left lower lobe. Central left airways remain patent. Superimposed mild reticulonodular distal peribronchial opacity in the lingula. No pleural effusion. Upper Abdomen: Negative visible noncontrast liver, spleen, pancreas, adrenal glands, kidneys, and bowel in the upper abdomen. Musculoskeletal: Osteopenia. No acute osseous abnormality identified. IMPRESSION: 1. Severe Emphysema  (ICD10-J43.9) with superimposed Multilobar Left Lung Pneumonia and suspicion of cavitation/necrosis in the left upper lobe. Consolidated superior and medial basal segments of the left lower lobe. Possible early infection in the lingula. No pleural effusion. 2. Reactive appearing superior mediastinal lymph nodes. 3. No acute findings in the right lung. 4. Calcified coronary artery atherosclerosis. Electronically Signed   By: Odessa FlemingH  Hall M.D.   On: 02/01/2019 23:29   Ct Angio Chest Pe W And/or Wo Contrast  Result Date: 02/05/2019 CLINICAL DATA:  Recent discharge from [facility] on 02/01/2019 now with recurrent severe shortness of breath and mid chest pain. EXAM: CT ANGIOGRAPHY CHEST WITH CONTRAST TECHNIQUE: Multidetector CT imaging of the chest was performed using the standard protocol during bolus administration of intravenous contrast. Multiplanar CT image reconstructions and MIPs were obtained to evaluate the vascular anatomy. CONTRAST:  100mL OMNIPAQUE IOHEXOL 350 MG/ML SOLN COMPARISON:  02/01/2019 FINDINGS: Vascular Findings: There is adequate opacification of the pulmonary arterial system with the main pulmonary artery measuring 598 Hounsfield units. Mixed short-segment occlusive though predominantly nonocclusive pulmonary emboli are seen at the peripheral aspect of the left lower lobe pulmonary artery (image 54, series 5), extending to involve several adjacent segmental and subsegmental pulmonary arteries (coronal images 89 through 96, series 8). There is a very minimal amount of nonocclusive pulmonary embolism seen at the bifurcation of the right interlobar pulmonary artery (image 64, series 5) as well as the bifurcation of the right middle lobar pulmonary artery (image 70, series 5). Overall bilateral pulmonary embolism clot burden is deemed small in volume without definitive enlargement of the caliber the main pulmonary artery or definitive interventricular septal bowing. Normal heart size. Coronary artery  calcifications. No pericardial effusion. Atherosclerotic plaque within a normal caliber thoracic aorta. No definite evidence of thoracic aortic dissection or periaortic stranding on this nongated examination. Bovine configuration of the aortic arch. The branch vessels of the aortic arch appear patent throughout their imaged courses. Review of the MIP images confirms the above findings. ---------------------------------------------------------------------------------- Nonvascular Findings: Mediastinum/Lymph Nodes: Mediastinal and  left hilar lymphadenopathy with index subcarinal nodal conglomeration measuring 1.3 cm in greatest short axis diameter (image 54, series 5 and index left hilar lymph node measuring 1.1 cm (image 52, series 5). Prominent prevascular lymph node measures 0.9 cm in greatest short axis diameter (image 42). No axillary lymphadenopathy. Lungs/Pleura: Redemonstrated severe atypical predominant mixed centrilobular and paraseptal emphysematous change, most severely affecting the left lung apex. Additionally, there is age-indeterminate consolidative masslike opacities involving the posteromedial aspect of both the left upper (representative image 49, series 7 and lower (image 101, series 7) lobes with associated air bronchograms. The right lung remains well aerated however note is made of short-segment occlusive debris within several basilar segmental bronchi (images 115 and 122, series 7), new compared to the 01/2019 examination. No pleural effusion or pneumothorax. Upper abdomen: Limited early arterial phase evaluation of the upper abdomen is unremarkable. Musculoskeletal: No acute or aggressive osseous abnormalities. Degenerative change of the lower cervical spine is suspected though incompletely evaluated. Regional soft tissues appear normal. Normal appearance of the thyroid gland. IMPRESSION: 1. Examination is positive for small volume bilateral pulmonary embolism, left greater than right, without  CT evidence of right-sided heart strain. 2. Severe apical predominant emphysematous change, most severely affecting the left lung apex. Emphysema (ICD10-J43.9). 3. Redemonstrated consolidative masslike left upper and lower lobe airspace opacities with associated air bronchograms, potentially infectious in etiology though underlying discrete lesions are not excluded on the basis of this examination. Follow-up chest CT in 4-6 weeks after treatment is recommended to ensure resolution. 4. Mediastinal and left hilar lymphadenopathy, again, potentially reactive in etiology. Attention on recommended follow-up is advised. 5. Occlusive debris within right lower lobe segmental and subsegmental bronchi without associated airspace opacity, new compared to the 02/01/2019 examination, nonspecific though could be seen in the setting of aspiration. Clinical correlation is advised. 6. Coronary artery calcifications. Aortic Atherosclerosis (ICD10-I70.0). Electronically Signed   By: Simonne Come M.D.   On: 02/05/2019 10:25   Dg Chest Portable 1 View  Result Date: 02/01/2019 CLINICAL DATA:  63 year old male with respiratory failure, negative for COVID-19. X3 over the course of this month. EXAM: PORTABLE CHEST 1 VIEW COMPARISON:  01/26/2019 and earlier. FINDINGS: Portable AP upright view at 1912 hours. Right PICC line has been removed. Continued coarse and confluent opacity in the left upper lobe, progressed compared to 01/26/2019. Superimposed pulmonary hyperinflation and evidence of emphysema. No pneumothorax or pleural effusion. Normal cardiac size and mediastinal contours. Visualized tracheal air column is within normal limits. IMPRESSION: Chronic lung disease with unresolved confluent left upper lobe opacity which is mildly progressed since 01/26/2019. This was first demonstrated on 01/09/2019, and was new from 12/09/2018 arguing against neoplasm and favoring an infectious/inflammatory etiology. No pleural effusion or new  cardiopulmonary abnormality. Electronically Signed   By: Odessa Fleming M.D.   On: 02/01/2019 19:30   Portable Chest 1 View  Result Date: 01/26/2019 CLINICAL DATA:  63 year old male with respiratory failure. Negative for COVID-19. EXAM: PORTABLE CHEST 1 VIEW COMPARISON:  01/25/2019 and earlier. FINDINGS: Portable AP upright view at 0523 hours. Underlying large lung volumes. Stable cardiac size and mediastinal contours. The right lung appears clear, with evidence of upper lung emphysema. Right PICC line has been placed and tip is at the level of the lower SVC. Patchy and nodular left upper lung opacity has mildly regressed since yesterday. Stable left lung ventilation elsewhere. No pneumothorax or pleural effusion. IMPRESSION: 1. Right PICC line placed, tip at the lower SVC level. 2. Evidence of emphysema.  Left upper lung opacity mildly regressed since yesterday. No new cardiopulmonary abnormality. Electronically Signed   By: Odessa Fleming M.D.   On: 01/26/2019 06:04   Dg Chest Portable 1 View  Result Date: 01/25/2019 CLINICAL DATA:  Acute on chronic respiratory failure. EXAM: PORTABLE CHEST 1 VIEW COMPARISON:  Single-view of the chest 01/16/2019, 01/15/2019 and 10/27/2018. FINDINGS: The lungs are emphysematous. Increased airspace disease is seen in the left upper lobe. There is basilar fibrosis, worse on the left. Heart size is normal. No pneumothorax. IMPRESSION: Increased left upper lobe airspace opacity worrisome for pneumonia superimposed on scar. Emphysema and basilar fibrosis, worse on the left. Electronically Signed   By: Drusilla Kanner M.D.   On: 01/25/2019 07:48   Dg Chest Port 1 View  Result Date: 01/16/2019 CLINICAL DATA:  63 year old male with pneumonia and acute on chronic respiratory failure with hypercapnia EXAM: PORTABLE CHEST 1 VIEW COMPARISON:  Multiple prior chest x-rays, most recently 01/15/2019 FINDINGS: Stable appearance of the chest without significant interval change over the last 24 hours.  Persistent interstitial and airspace opacities in the left upper and left lower lobes. No evidence of new airspace opacity, pleural effusion, pneumothorax or pulmonary edema. Stable background hyperinflation, emphysematous changes and chronic bronchitic changes. IMPRESSION: Stable chest x-ray without significant interval change. Persistent left upper and left lower lobe airspace opacities may represent residual areas of infiltrate/pneumonia versus developing pleuroparenchymal scarring. Electronically Signed   By: Malachy Moan M.D.   On: 01/16/2019 14:58   Dg Chest Port 1 View  Result Date: 01/15/2019 CLINICAL DATA:  Shortness of breath EXAM: PORTABLE CHEST 1 VIEW COMPARISON:  01/14/2019 FINDINGS: Cardiac shadows within normal limits. The right lung is again clear. Left lung again demonstrates persistent infiltrates in the left upper lobe and left base stable from the previous exam. No new focal abnormality is noted. IMPRESSION: Stable left-sided infiltrates when compare with the previous exam. Electronically Signed   By: Alcide Clever M.D.   On: 01/15/2019 03:12   Dg Chest Port 1 View  Result Date: 01/14/2019 CLINICAL DATA:  Dyspnea. EXAM: PORTABLE CHEST 1 VIEW COMPARISON:  01/12/2019 FINDINGS: COPD with hyperinflation. Left upper lobe infiltrate unchanged. Left lower lobe infiltrate with progressive volume loss. No significant pleural effusion. Negative for heart failure. IMPRESSION: Left upper lobe and left lower lobe infiltrates compatible with pneumonia. Progressive volume loss in the left lung base. No effusion. Electronically Signed   By: Marlan Palau M.D.   On: 01/14/2019 13:52   Dg Chest Port 1 View  Result Date: 01/12/2019 CLINICAL DATA:  Respiratory failure EXAM: PORTABLE CHEST 1 VIEW COMPARISON:  01/09/2019 FINDINGS: Heart is normal size. Diffuse airspace disease throughout the left lung, most confluent in the left upper lobe. This is unchanged. No confluent opacity on the right. Heart  is normal size. IMPRESSION: Diffuse left lung airspace disease, unchanged since prior study. Electronically Signed   By: Charlett Nose M.D.   On: 01/12/2019 08:08   Dg Chest Portable 1 View  Result Date: 01/09/2019 CLINICAL DATA:  Hypoxia EXAM: PORTABLE CHEST 1 VIEW COMPARISON:  January 09, 2019 study obtained earlier in the day FINDINGS: Endotracheal tube tip is 4.0 cm above the carina. Nasogastric tube tip is in the stomach. Side port not well seen but probably near the gastroesophageal junction. No pneumothorax evident. : There is airspace consolidation throughout portions of the left upper and left lower lobe. There is mild right base atelectasis. The right lung is otherwise clear. Heart size and pulmonary vascularity  are normal. No adenopathy. No bone lesions. IMPRESSION: Tube positions as described without pneumothorax. Extensive airspace opacity felt to represent multifocal pneumonia throughout left upper and lower lobes. Mild right base atelectasis. Heart size normal. Electronically Signed   By: Bretta Bang III M.D.   On: 01/09/2019 18:22   Dg Chest Port 1 View  Result Date: 01/09/2019 CLINICAL DATA:  Decreased level of consciousness and shortness of breath EXAM: PORTABLE CHEST 1 VIEW COMPARISON:  12/09/2018 FINDINGS: Cardiac shadows within normal limits. Lungs are hyperinflated similar to that seen on the prior exam. Patchy infiltrate is noted in the left upper lobe and to a lesser degree in the left lower lobe consistent with multifocal pneumonia. No sizable effusion is seen. No bony abnormality is noted. IMPRESSION: Patchy multifocal pneumonia within the left lung as described. COPD. Electronically Signed   By: Alcide Clever M.D.   On: 01/09/2019 12:46   [place] Ekg Site Rite  Result Date: 01/25/2019 If Site Rite image not attached, placement could not be confirmed due to current cardiac rhythm.     Subjective: Pt says that he is feeling better and he is breathing better and he feels that he  can manage at home now that he will have home bipap available.  His pleuritic chest wall pain is better.    Discharge Exam: Vitals:   02/05/19 0740 02/05/19 1132  BP:    Pulse: (!) 104   Resp: (!) 24 17  Temp: 98.3 F (36.8 C) 98.3 F (36.8 C)  SpO2: 93%    Vitals:   02/05/19 0600 02/05/19 0717 02/05/19 0740 02/05/19 1132  BP: (!) 107/55     Pulse: (!) 104 (!) 105 (!) 104   Resp: (!) 22 (!) 24 (!) 24 17  Temp:   98.3 F (36.8 C) 98.3 F (36.8 C)  TempSrc:   Oral Oral  SpO2: 95% 100% 93%   Weight:      Height:       General exam: off bipap on [place] sitting in bed, NAD.  Respiratory system: BBS poor air movement, slightly improved. No increased work of breathing. Cardiovascular system: S1 & S2 heard. No JVD, murmurs, gallops, clicks or pedal edema. Gastrointestinal system: Abdomen is nondistended, soft and nontender. Normal bowel sounds heard. Central nervous system: Alert and oriented. No focal neurological deficits. Extremities: no CCE.   The results of significant diagnostics from this hospitalization (including imaging, microbiology, ancillary and laboratory) are listed below for reference.     Microbiology: No results found for this or any previous visit (from the past 240 hour(s)).   Labs: BNP (last 3 results) Recent Labs    01/27/19 0412 01/28/19 0640 01/29/19 0513  BNP 121.0* 83.0 130.0*   Basic Metabolic Panel: Recent Labs  Lab 01/30/19 0410 02/01/19 1937 02/02/19 0509  NA 136 139 139  K 4.1 4.2 4.7  CL 101 98 102  CO2 GLUCOSE 158* 69* 66*  BUN 24* 22 21  CREATININE 0.74 1.01 0.82  CALCIUM 9.2 9.8 9.0   Liver Function Tests: Recent Labs  Lab 01/30/19 0410  AST 17  ALT 33  ALKPHOS 105  BILITOT 0.3  PROT 5.5*  ALBUMIN 1.8*   No results for input(s): LIPASE, AMYLASE in the last 168 hours. No results for input(s): AMMONIA in the last 168 hours. CBC: Recent Labs  Lab 01/30/19 0410 02/01/19 1937 02/02/19 0509  WBC 25.5* 26.7*  24.3*  NEUTROABS 24.3*  --   --  HGB 8.8* 12.5* 10.1*  HCT 29.3* 41.7 34.0*  MCV 88.5 89.7 90.2  PLT 249 242 175   Cardiac Enzymes: No results for input(s): CKTOTAL, CKMB, CKMBINDEX, TROPONINI in the last 168 hours. BNP: Invalid input(s): POCBNP CBG: Recent Labs  Lab 02/04/19 1626 02/04/19 2124 02/05/19 0317 02/05/19 0738 02/05/19 1133  GLUCAP 303* 238* 234* 185* 219*   D-Dimer No results for input(s): DDIMER in the last 72 hours. Hgb A1c No results for input(s): HGBA1C in the last 72 hours. Lipid Profile No results for input(s): CHOL, HDL, LDLCALC, TRIG, CHOLHDL, LDLDIRECT in the last 72 hours. Thyroid function studies No results for input(s): TSH, T4TOTAL, T3FREE, THYROIDAB in the last 72 hours.  Invalid input(s): FREET3 Anemia work up No results for input(s): VITAMINB12, FOLATE, FERRITIN, TIBC, IRON, RETICCTPCT in the last 72 hours. Urinalysis    Component Value Date/Time   COLORURINE YELLOW 01/25/2019 0743   APPEARANCEUR HAZY (A) 01/25/2019 0743   LABSPEC 1.027 01/25/2019 0743   PHURINE 7.0 01/25/2019 0743   GLUCOSEU 150 (A) 01/25/2019 0743   HGBUR NEGATIVE 01/25/2019 0743   BILIRUBINUR NEGATIVE 01/25/2019 0743   KETONESUR NEGATIVE 01/25/2019 0743   PROTEINUR 30 (A) 01/25/2019 0743   UROBILINOGEN 0.2 02/08/2008 1731   NITRITE NEGATIVE 01/25/2019 0743   LEUKOCYTESUR NEGATIVE 01/25/2019 0743   Sepsis Labs Invalid input(s): PROCALCITONIN,  WBC,  LACTICIDVEN Microbiology No results found for this or any previous visit (from the past 240 hour(s)).  Time coordinating discharge: 35 minutes   SIGNED:  Standley Dakins, MD  [facility] 02/05/2019, 1:08 PM How to contact the [facility] Attending or Consulting provider 7A - 7P or covering provider during after hours 7P -7A, for this patient?  1. Check the care team in [facility] and look for a) attending/consulting [facility] provider listed and b) the [facility] team listed 2. Log into [email or web address] and use [facility]'s universal  password to access. If you do not have the password, please contact the hospital operator. 3. Locate the [facility] provider you are looking for under [facility] and page to a number that you can be directly reached. 4. If you still have difficulty reaching the provider, please page the [facility] (Director on Call) for the Hospitalists listed on amion for assistance.

## 2019-02-05 NOTE — Progress Notes (Signed)
ANTICOAGULATION CONSULT NOTE -  Consult  Pharmacy Consult for eliquis Indication: pulmonary embolus  No Known Allergies  Patient Measurements: Height: 6' (182.9 cm) Weight: 158 lb 4.6 oz (71.8 kg) IBW/kg (Calculated) : 77.6  Vital Signs: Temp: 98.3 F (36.8 C) (05/04 0740) Temp Source: Oral (05/04 0740) BP: 107/55 (05/04 0600) Pulse Rate: 104 (05/04 0740)  Labs: No results for input(s): HGB, HCT, PLT, APTT, LABPROT, INR, HEPARINUNFRC, HEPRLOWMOCWT, CREATININE, CKTOTAL, CKMB, TROPONINI in the last 72 hours.  Estimated Creatinine Clearance: 92.4 mL/min (by C-G formula based on SCr of 0.82 mg/dL).   Medications:  Medications Prior to Admission  Medication Sig Dispense Refill Last Dose  . albuterol (PROAIR HFA) 108 (90 BASE) MCG/ACT inhaler Inhale 2 puffs into the lungs every 6 (six) hours as needed for wheezing or shortness of breath. Shortness of Breath   02/01/2019 at Unknown time  . ALPRAZolam (XANAX) 1 MG tablet Take 0.5 tablets (0.5 mg total) by mouth 2 (two) times daily as needed for anxiety.  0 02/01/2019 at Unknown time  . fexofenadine-pseudoephedrine (ALLEGRA-D 24) 180-240 MG 24 hr tablet Take 1 tablet by mouth daily.   02/01/2019 at Unknown time  . fluticasone (FLONASE) 50 MCG/ACT nasal spray Place 2 sprays into both nostrils daily.   02/01/2019 at Unknown time  . glimepiride (AMARYL) 2 MG tablet Take 2 mg by mouth daily with breakfast.   Past Week at Unknown time  . Guaifenesin (MUCINEX MAXIMUM STRENGTH) 1200 MG TB12 Take 1 tablet (1,200 mg total) by mouth 2 (two) times daily. 20 each 0 02/01/2019 at Unknown time  . Insulin Glargine (LANTUS) 100 UNIT/ML Solostar Pen Inject 30 Units into the skin daily. 15 mL 2 Past Week at Unknown time  . ipratropium-albuterol (DUONEB) 0.5-2.5 (3) MG/3ML SOLN Take 3 mLs by nebulization every 4 (four) hours as needed. For shortness of breath   02/01/2019 at Unknown time  . metFORMIN (GLUCOPHAGE-XR) 500 MG 24 hr tablet Take 2 tablets (1,000 mg  total) by mouth 2 (two) times daily.     . montelukast (SINGULAIR) 10 MG tablet Take 10 mg by mouth every morning.    02/01/2019 at Unknown time  . Multiple Vitamins-Minerals (CENTRUM SILVER 50+MEN PO) Take 1 tablet by mouth daily.   02/01/2019 at Unknown time  . oxybutynin (DITROPAN) 5 MG tablet Take 1 tablet (5 mg total) by mouth 2 (two) times daily.   02/01/2019 at Unknown time  . pantoprazole (PROTONIX) 40 MG tablet Take 40 mg by mouth every morning.    02/01/2019 at Unknown time  . predniSONE (DELTASONE) 20 MG tablet Take 3 PO QAM x5days, 2 PO QAM x5days, 1 PO QAM x5days 30 tablet 0 02/01/2019 at Unknown time  . rosuvastatin (CRESTOR) 40 MG tablet Take 40 mg by mouth at bedtime.    02/01/2019 at Unknown time  . TRELEGY ELLIPTA 100-62.5-25 MCG/INH AEPB Take 2 puffs by mouth daily.   02/01/2019 at Unknown time   Scheduled:  . apixaban  10 mg Oral BID   Followed by  . [START ON 02/12/2019] apixaban  5 mg Oral BID  . chlorhexidine  15 mL Mouth Rinse BID  . Chlorhexidine Gluconate Cloth  6 each Topical Q0600  . fluticasone  2 spray Each Nare Daily  . fluticasone furoate-vilanterol  1 puff Inhalation Daily   And  . umeclidinium bromide  1 puff Inhalation Daily  . guaiFENesin  1,200 mg Oral BID  . insulin aspart  0-5 Units Subcutaneous QHS  . insulin aspart  0-9 Units Subcutaneous TID WC  . insulin aspart  3 Units Subcutaneous TID WC  . insulin glargine  14 Units Subcutaneous QHS  . ipratropium-albuterol  3 mL Inhalation Q4H  . loratadine  10 mg Oral Daily  . mouth rinse  15 mL Mouth Rinse q12n4p  . montelukast  10 mg Oral q morning - 10a  . multivitamin with minerals  1 tablet Oral Daily  . oxybutynin  5 mg Oral BID  . pantoprazole  40 mg Oral q morning - 10a  . predniSONE  60 mg Oral Q breakfast  . rosuvastatin  40 mg Oral QHS  . sodium chloride flush  10-40 mL Intracatheter Q12H   Infusions:   PRN: acetaminophen **OR** acetaminophen, albuterol, ALPRAZolam, sodium chloride  flush Anti-infectives (From admission, onward)   None      Assessment: 63 year old male whom developed bilateral pulmonary embolism, pharmacy has been consulted for eliquis dosing.  Goal of Therapy:  anticoagulation with apixaban for treatment dosing of bilateral PE  Monitor platelets by anticoagulation protocol: Yes   Plan:  Apixaban 10mg  po bid x 7 days followed by apixaban 5mg  po bid thereafter Following MD to determine length of AC therapy  Will monitor for s&s of bleeding  Gerre Pebbles [place]alah Asberry 02/05/2019,11:29 AM

## 2019-02-05 NOTE — Progress Notes (Addendum)
Inpatient Diabetes Program Recommendations  AACE/ADA: New Consensus Statement on Inpatient Glycemic Control (2015)  Target Ranges:  Prepandial:   less than 140 mg/dL      Peak postprandial:   less than 180 mg/dL (1-2 hours)      Critically ill patients:  140 - 180 mg/dL   Results for BRYNDON, KUSIAK (MRN [id number]) as of 02/05/2019 06:54  Ref. Range 02/04/2019 02:40 02/04/2019 07:54 02/04/2019 10:59 02/04/2019 16:26 02/04/2019 21:24  Glucose-Capillary Latest Ref Range: 70 - 99 mg/dL 865 (H) 82 784 (H)  3 units NOVOLOG  303 (H)  7 units NOVOLOG  238 (H)  2 units NOVOLOG +  14 units LANTUS   Results for IZAN, ROSSELOT (MRN [id number]) as of 02/05/2019 06:54  Ref. Range 02/05/2019 03:17  Glucose-Capillary Latest Ref Range: 70 - 99 mg/dL 132 (H)     Home DM Meds: Lantus 30 units Daily                             Amaryl 2 mg Daily                             Metformin 1000 mg BID  Current Orders: Lantus 14 units QHS      Novolog Sensitive Correction Scale/ SSI (0-9 units) TID AC + HS     Patient had issues with Hypoglycemia on 05/01--No documented Hypoglycemia since 05/01.  Now getting Lantus and Novolog.  Continues on Prednisone 60 mg Daily.  Afternoon CBGs elevated yesterday possibly due to the Prednisone.   MD- If patient eating well, please consider adding Novolog Meal Coverage to current insulin regimen:  Novolog 3 units TID with meals  (Please add the following Hold Parameters: Hold if pt eats <50% of meal, Hold if pt NPO)     --Will follow patient during hospitalization--  Ambrose Finland RN, MSN, CDE Diabetes Coordinator Inpatient Glycemic Control Team Team Pager: [phone] (8a-5p)

## 2019-02-05 NOTE — Discharge Instructions (Signed)
Chronic Respiratory Failure  Respiratory failure is a condition in which the lungs do not work well and the breathing (respiratory) system fails. When respiratory failure occurs, it becomes difficult for the lungs to get enough oxygen or to eliminate carbon dioxide or to do both duties. If the lungs do not work properly, the heart, brain, and other body systems do not get enough oxygen. Respiratory failure is life-threatening if it is not treated. Respiratory failure can be acute or chronic. Acute respiratory failure is sudden and severe and requires emergency medical treatment. Chronic respiratory failure happens over time, usually due to a medical condition that gets worse. What are the causes? This condition may be caused by any problem that affects the heart or lungs. Causes include:  Chronic bronchitis and emphysema (COPD).  Pulmonary fibrosis.  Water in the lungs due to heart failure, lung injury, or infection (pulmonary edema).  Asthma.  Nerve or muscle diseases that make chest movements difficult, such as Leta Baptist disease or Guillain-Barre syndrome.  A collapsed lung (pneumothorax).  Pulmonary hypertension.  Chronic sleep apnea.  Pneumonia.  Obesity.  A blood clot in a lung (pulmonary embolism).  Trauma to the chest that makes breathing difficult. What increases the risk? You are more likely to develop this condition if:  You are a smoker, or have a history of smoking.  You have a weak immune system.  You have a family history of breathing problems or lung disease.  You have a long term lung disease such as COPD. What are the signs or symptoms? Symptoms of this condition include:  Shortness of breath with or without activity.  Difficulty breathing.  Wheezing.  A fast or irregular heartbeat (arrhythmia).  Chest pain or tightness.  A bluish color to the fingernail or toenail beds (cyanosis).  Confusion.  Drowsiness.  Extreme fatigue, especially with  minimal activity. How is this diagnosed? This condition may be diagnosed based on:  Your medical history.  A physical exam.  Other tests, such as: ? A chest X-ray. ? A CT scan of your lungs. ? Blood tests, such as an arterial blood gas test. This test is done to check if you have enough oxygen in your blood. ? An electrocardiogram. This test records the electrical activity of your heart. ? An echocardiogram. This test uses sound waves to produce an image of your heart.  A check of your blood pressure, heart rate, breathing rate, and blood oxygen level. How is this treated? Treatment for this condition depends on the cause. Treatment can include the following:  Getting oxygen through a nasal cannula. This is a tube that goes in your nose.  Getting oxygen through a face mask.  Receiving noninvasive positive pressure ventilation. This is a method of breathing support in which a machine blows air into your lungs through a mask. The machine allows you to breathe on your own. It helps the body take in oxygen and eliminate carbon dioxide.  Using a ventilator. This is a breathing machine that delivers oxygen to the lungs through a breathing tube that is put into the trachea. This machine is used when you can no longer breathe well enough on your own.  Medicines to help with breathing, such as: ? Medicines that open up and relax air passages, such as bronchodilators. These may be given through a device that turns liquid medicines into a mist you can breathe in (nebulizer). These medicines help with breathing. ? Diuretics. These medicines get rid of extra fluid out  of your lungs, which can help you breathe better. ? Steroid medicines. These decrease inflammation in the lungs. ? Antibiotic medicines. These may be given to treat a bacterial infection, such as pneumonia.  Pulmonary rehabilitation. This is an exercise program that strengthens the muscles in your chest and helps you learn breathing  techniques in order to manage your condition. Follow these instructions at home: Medicines  Take over-the-counter and prescription medicines only as told by your health care provider.  If you were prescribed an antibiotic medicine, take it as told by your health care provider. Do not stop taking the antibiotic even if you start to feel better. General instructions  Use oxygen therapy and pulmonary rehabilitation if directed to by your health care provider. If you require home oxygen therapy, ask your health care provider whether you should purchase a pulse oximeter to measure your oxygen level at home.  Work with your health care provider to create a plan to help you deal with your condition. Follow this plan.  Do not use any products that contain nicotine or tobacco, such as cigarettes and e-cigarettes. If you need help quitting, ask your health care provider.  Avoid exposure to irritants that make your breathing problems worse. These include smoke, chemicals, and fumes.  Stay active, but balance activity with periods of rest. Exercise and physical activity will help you maintain your ability to do things you want to do.  Stay up to date on all vaccines, especially yearly influenza and pneumonia vaccines.  Avoid people who are sick as well as crowded places during the flu season.  Keep all follow-up visits as told by your health care provider. This is important. Contact a health care provider if:  Your shortness of breath gets worse and you cannot do the things you used to do.  You have increased mucus (sputum), wheezing, coughing, or loss of energy.  You are on oxygen therapy and you are starting to need more.  You need to use your medicines more often.  You have a fever. Get help right away if:  Your shortness of breath becomes worse.  You are unable to say more than a few words without having to catch your breath.  You develop chest pain or  tightness. Summary  Respiratory failure is a condition in which the lungs do not work well and the breathing system fails.  This condition can be very serious and is often life-threatening.  This condition is diagnosed with tests and can be treated with medicines or oxygen.  Contact a health care provider if your shortness of breath gets worse or if you need to use your oxygen or medicines more often than before. This information is not intended to replace advice given to you by your health care provider. Make sure you discuss any questions you have with your health care provider. Document Released: 09/20/2005 Document Revised: 10/01/2016 Document Reviewed: 10/01/2016 Elsevier Interactive Patient Education  2019 [employer] Inc[employer]     Preventing Hypoglycemia Hypoglycemia occurs when the level of sugar (glucose) in the blood is too low. Hypoglycemia can happen in people who do or do not have diabetes (diabetes mellitus). It can develop quickly, and it can be a medical emergency. For most people with diabetes, a blood glucose level below 70 mg/dL (3.9 mmol/L) is considered hypoglycemia. Glucose is a type of sugar that provides the body's main source of energy. Certain hormones (insulin and glucagon) control the level of glucose in the blood. Insulin lowers blood glucose, and  glucagon increases blood glucose. Hypoglycemia can result from having too much insulin in the bloodstream, or from not eating enough food that contains glucose. Your risk for hypoglycemia is higher:  If you take insulin or diabetes medicines to help lower your blood glucose or help your body make more insulin.  If you skip or delay a meal or snack.  If you are ill.  During and after exercise. You can prevent hypoglycemia by working with your health care provider to adjust your meal plan as needed and by taking other precautions. How can hypoglycemia affect me? Mild symptoms Mild hypoglycemia may not cause any symptoms.  If you do have symptoms, they may include:  Hunger.  Anxiety.  Sweating and feeling clammy.  Dizziness or feeling light-headed.  Sleepiness.  Nausea.  Increased heart rate.  Headache.  Blurry vision.  Irritability.  Tingling or numbness around the mouth, lips, or tongue.  A change in coordination.  Restless sleep. If mild hypoglycemia is not recognized and treated, it can quickly become moderate or severe hypoglycemia. Moderate symptoms Moderate hypoglycemia can cause:  Mental confusion and poor judgment.  Behavior changes.  Weakness.  Irregular heartbeat. Severe symptoms Severe hypoglycemia is a medical emergency. It can cause:  Fainting.  Seizures.  Loss of consciousness (coma).  Death. What nutrition changes can be made?  Work with your health care provider or diet and nutrition specialist (dietitian) to make a healthy meal plan that is right for you. Follow your meal plan carefully.  Eat meals at regular times.  If recommended by your health care provider, have snacks between meals.  Donot skip or delay meals or snacks. You can be at risk for hypoglycemia if you are not getting enough carbohydrates. What lifestyle changes can be made?   Work closely with your health care provider to manage your blood glucose. Make sure you know: ? Your goal blood glucose levels. ? How and when to check your blood glucose. ? The symptoms of hypoglycemia. It is important to treat it right away to keep it from becoming severe.  Do not drink alcohol on an empty stomach.  When you are ill, check your blood glucose more often than usual. Follow your sick day plan whenever you cannot eat or drink normally. Make this plan in advance with your health care provider.  Always check your blood glucose before, during, and after exercise. How is this treated? This condition can often be treated by immediately eating or drinking something that contains sugar, such  as:  Fruit juice, 4-6 oz (120-150 mL).  Regular (not diet) soda, 4-6 oz (120-150 mL).  Low-fat milk, 4 oz (120 mL).  Several pieces of hard candy.  Sugar or honey, 1 Tbsp (15 mL). Treating hypoglycemia if you have diabetes If you are alert and able to swallow safely, follow the 15:15 rule:  Take 15 grams of a rapid-acting carbohydrate. Talk with your health care provider about how much you should take.  Rapid-acting options include: ? Glucose pills (take 15 grams). ? 6-8 pieces of hard candy. ? 4-6 oz (120-150 mL) of fruit juice. ? 4-6 oz (120-150 mL) of regular (not diet) soda.  Check your blood glucose 15 minutes after you take the carbohydrate.  If the repeat blood glucose level is still at or below 70 mg/dL (3.9 mmol/L), take 15 grams of a carbohydrate again.  If your blood glucose level does not increase above 70 mg/dL (3.9 mmol/L) after 3 tries, seek emergency medical care.  After  your blood glucose level returns to normal, eat a meal or a snack within 1 hour. Treating severe hypoglycemia Severe hypoglycemia is when your blood glucose level is at or below 54 mg/dL (3 mmol/L). Severe hypoglycemia is a medical emergency. Get medical help right away. If you have severe hypoglycemia and you cannot eat or drink, you may need an injection of glucagon. A family member or close friend should learn how to check your blood glucose and how to give you a glucagon injection. Ask your health care provider if you need to have an emergency glucagon injection kit available. Severe hypoglycemia may need to be treated in a hospital. The treatment may include getting glucose through an IV. You may also need treatment for the cause of your hypoglycemia. Where to find more information  American Diabetes Association: www.diabetes.[employer] Diabetes and Digestive and Kidney Diseases: [employer] Contact a health care provider if:  You have problems keeping your blood  glucose in your target range.  You have frequent episodes of hypoglycemia. Get help right away if:  You continue to have hypoglycemia symptoms after eating or drinking something containing glucose.  Your blood glucose level is at or below 54 mg/dL (3 mmol/L).  You faint.  You have a seizure. These symptoms may represent a serious problem that is an emergency. Do not wait to see if the symptoms will go away. Get medical help right away. Call your local emergency services (911 in the [place] Summary  Know the symptoms of hypoglycemia, and when you are at risk for it (such as during exercise or when you are sick). Check your blood glucose often when you are at risk for hypoglycemia.  Hypoglycemia can develop quickly, and it can be dangerous if it is not treated right away. If you have a history of severe hypoglycemia, make sure you know how to use your glucagon injection kit.  Make sure you know how to treat hypoglycemia. Keep a carbohydrate snack available when you may be at risk for hypoglycemia. This information is not intended to replace advice given to you by your health care provider. Make sure you discuss any questions you have with your health care provider. Document Released: 05/18/2017 Document Revised: 03/13/2018 Document Reviewed: 05/18/2017 Elsevier Interactive Patient Education  2019 [employer] Inc.    Pulmonary Embolism  A pulmonary embolism (PE) is a sudden blockage or decrease of blood flow in one lung or both lungs. Most blockages come from a blood clot that forms in a lower leg, thigh, or arm vein (deep vein thrombosis, DVT) and travels to the lungs. A clot is blood that has thickened into a gel or solid. PE is a dangerous and life-threatening condition that needs to be treated right away. What are the causes? This condition is usually caused by a blood clot that forms in a vein and moves to the lungs. In rare cases, it may be caused by air, fat, part of a tumor, or other  tissue that moves through the veins and into the lungs. What increases the risk? The following factors may make you more likely to develop this condition:  Traumatic injury, such as breaking a hip or leg.  Spinal cord injury.  Orthopedic surgery, especially hip or knee replacement.  Any major surgery.  Stroke.  Having DVT.  Blood clots or blood clotting disease.  Long-term (chronic) lung or heart disease.  Taking medicines that contain estrogen. These include birth control pills and hormone replacement therapy.  Cancer  and chemotherapy.  Having a central venous catheter.  Pregnancy and the period of time after delivery (postpartum).  Being older than age 42.  Being overweight.  Smoking. What are the signs or symptoms? Symptoms of this condition usually start suddenly and include:  Shortness of breath during activity or at rest.  Coughing or coughing up blood or blood-tinged mucus.  Chest pain that is often worse with deep breaths.  Rapid or irregular heartbeat.  Feeling light-headed or dizzy.  Fainting.  Feeling anxious.  Fever.  Sweating.  Pain and swelling in a leg. This is a symptom of DVT, which can lead to PE. How is this diagnosed? This condition may be diagnosed based on:  Your medical history.  A physical exam.  Blood tests.  CT pulmonary angiogram. This test checks blood flow in and around your lungs.  Ventilation-perfusion scan, also called a lung VQ scan. This test measures air flow and blood flow to the lungs.  Ultrasound of the legs. How is this treated? Treatment for this condition depends on many factors, such as the cause of your PE, your risk for bleeding or developing more clots, and other medical conditions you have. Treatment aims to remove, dissolve, or stop blood clots from forming or growing larger. Treatment may include:  Medicines, such as: ? Blood thinning medicines (anticoagulants) to stop clots from forming or  growing. ? Medicines that dissolve clots (thrombolytics).  Procedures, such as: ? Using a flexible tube to remove a blood clot (embolectomy) or deliver medicine to destroy it (catheter-directed thrombolysis). ? Inserting a filter into a large vein that carries blood to the heart (inferior vena cava). This filter (vena cava filter) catches blood clots before they reach the lungs. ? Surgery to remove the clot (surgical embolectomy). This is rare. You may need a combination of immediate, long-term (up to 3 months after diagnosis), and extended (more than 3 months after diagnosis) treatments. Your treatment may continue for several months (maintenance therapy). You and your health care provider will work together to choose the treatment program that is best for you. Follow these instructions at home: Medicines  Take over-the-counter and prescription medicines only as told by your health care provider.  If you are taking an anticoagulant medicine: ? Take the medicine every day at the same time each day. ? Understand what foods and drugs interact with your medicine. ? Understand the side effects of this medicine, including excessive bruising or bleeding. Ask your health care provider or pharmacist about other side effects. General instructions  Wear a medical alert bracelet or carry a medical alert card that says you have had a PE and lists what medicines you take.  Ask your health care provider when you may return to your normal activities. Avoid sitting or lying for a long time without moving.  Maintain a healthy weight. Ask your health care provider what weight is healthy for you.  Do not use any products that contain nicotine or tobacco, such as cigarettes and e-cigarettes. If you need help quitting, ask your health care provider.  Talk with your health care provider about any travel plans. It is important to make sure that you are still able to take your medicine while on trips.  Keep all  follow-up visits as told by your health care provider. This is important. Contact a health care provider if:  You missed a dose of your blood thinner medicine. Get help right away if:  You have: ? New or increased pain, swelling,  warmth, or redness in an arm or leg. ? Numbness or tingling in an arm or leg. ? Shortness of breath during activity or at rest. ? A fever. ? Chest pain. ? A rapid or irregular heartbeat. ? A severe headache. ? Vision changes. ? A serious fall or accident, or you hit your head. ? Stomach (abdominal) pain. ? Blood in your vomit, stool, or urine. ? A cut that will not stop bleeding.  You cough up blood.  You feel light-headed or dizzy.  You cannot move your arms or legs.  You are confused or have memory loss. These symptoms may represent a serious problem that is an emergency. Do not wait to see if the symptoms will go away. Get medical help right away. Call your local emergency services (911 in the [place] Do not drive yourself to the hospital. Summary  A pulmonary embolism (PE) is a sudden blockage or decrease of blood flow in one lung or both lungs. PE is a dangerous and life-threatening condition that needs to be treated right away.  Treatments for this condition usually include medicines to thin your blood (anticoagulants) or medicines to break apart blood clots (thrombolytics).  If you are given blood thinners, it is important to take the medicine every single day at the same time each day.  If you have signs of PE or DVT, call your local emergency services (911 in the [place] This information is not intended to replace advice given to you by your health care provider. Make sure you discuss any questions you have with your health care provider. Document Released: 09/17/2000 Document Revised: 05/05/2018 Document Reviewed: 11/03/2017 Elsevier Interactive Patient Education  2019 [employer] Inc.    CPAP and BPAP Information CPAP and BPAP are methods  of helping a person breathe with the use of air pressure. CPAP stands for "continuous positive airway pressure." BPAP stands for "bi-level positive airway pressure." In both methods, air is blown through your nose or mouth and into your air passages to help you breathe well. CPAP and BPAP use different amounts of pressure to blow air. With CPAP, the amount of pressure stays the same while you breathe in and out. With BPAP, the amount of pressure is increased when you breathe in (inhale) so that you can take larger breaths. Your health care provider will recommend whether CPAP or BPAP would be more helpful for you. Why are CPAP and BPAP treatments used? CPAP or BPAP can be helpful if you have:  Sleep apnea.  Chronic obstructive pulmonary disease (COPD).  Heart failure.  Medical conditions that weaken the muscles of the chest including muscular dystrophy, or neurological diseases such as amyotrophic lateral sclerosis (ALS).  Other problems that cause breathing to be weak, abnormal, or difficult. CPAP is most commonly used for obstructive sleep apnea (OSA) to keep the airways from collapsing when the muscles relax during sleep. How is CPAP or BPAP administered? Both CPAP and BPAP are provided by a small machine with a flexible plastic tube that attaches to a plastic mask. You wear the mask. Air is blown through the mask into your nose or mouth. The amount of pressure that is used to blow the air can be adjusted on the machine. Your health care provider will determine the pressure setting that should be used based on your individual needs. When should CPAP or BPAP be used? In most cases, the mask only needs to be worn during sleep. Generally, the mask needs to be worn throughout the night  and during any daytime naps. People with certain medical conditions may also need to wear the mask at other times when they are awake. Follow instructions from your health care provider about when to use the  machine. What are some tips for using the mask?   Because the mask needs to be snug, some people feel trapped or closed-in (claustrophobic) when first using the mask. If you feel this way, you may need to get used to the mask. One way to do this is by holding the mask loosely over your nose or mouth and then gradually applying the mask more snugly. You can also gradually increase the amount of time that you use the mask.  Masks are available in various types and sizes. Some fit over your mouth and nose while others fit over just your nose. If your mask does not fit well, talk with your health care provider about getting a different one.  If you are using a mask that fits over your nose and you tend to breathe through your mouth, a chin strap may be applied to help keep your mouth closed.  The CPAP and BPAP machines have alarms that may sound if the mask comes off or develops a leak.  If you have trouble with the mask, it is very important that you talk with your health care provider about finding a way to make the mask easier to tolerate. Do not stop using the mask. Stopping the use of the mask could have a negative impact on your health. What are some tips for using the machine?  Place your CPAP or BPAP machine on a secure table or stand near an electrical outlet.  Know where the on/off switch is located on the machine.  Follow instructions from your health care provider about how to set the pressure on your machine and when you should use it.  Do not eat or drink while the CPAP or BPAP machine is on. Food or fluids could get pushed into your lungs by the pressure of the CPAP or BPAP.  Do not smoke. Tobacco smoke residue can damage the machine.  For home use, CPAP and BPAP machines can be rented or purchased through home health care companies. Many different brands of machines are available. Renting a machine before purchasing may help you find out which particular machine works well for  you.  Keep the CPAP or BPAP machine and attachments clean. Ask your health care provider for specific instructions. Get help right away if:  You have redness or open areas around your nose or mouth where the mask fits.  You have trouble using the CPAP or BPAP machine.  You cannot tolerate wearing the CPAP or BPAP mask.  You have pain, discomfort, and bloating in your abdomen. Summary  CPAP and BPAP are methods of helping a person breathe with the use of air pressure.  Both CPAP and BPAP are provided by a small machine with a flexible plastic tube that attaches to a plastic mask.  If you have trouble with the mask, it is very important that you talk with your health care provider about finding a way to make the mask easier to tolerate. This information is not intended to replace advice given to you by your health care provider. Make sure you discuss any questions you have with your health care provider. Document Released: 06/18/2004 Document Revised: 05/23/2018 Document Reviewed: 08/09/2016 Elsevier Interactive Patient Education  2019 [employer] Inc.    IMPORTANT INFORMATION: PAY CLOSE  ATTENTION   PHYSICIAN DISCHARGE INSTRUCTIONS  Follow with Primary care provider  Lemmie Evens, MD  and other consultants as instructed your Hospitalist Physician  SEEK MEDICAL CARE OR RETURN TO EMERGENCY ROOM IF SYMPTOMS COME BACK, WORSEN OR NEW PROBLEM DEVELOPS.   Please note: You were cared for by a hospitalist during your hospital stay. Every effort will be made to forward records to your primary care provider.  You can request that your primary care provider send for your hospital records if they have not received them.  Once you are discharged, your primary care physician will handle any further medical issues. Please note that NO REFILLS for any discharge medications will be authorized once you are discharged, as it is imperative that you return to your primary care physician (or establish a  relationship with a primary care physician if you do not have one) for your post hospital discharge needs so that they can reassess your need for medications and monitor your lab values.  Please get a complete blood count and chemistry panel checked by your Primary MD at your next visit, and again as instructed by your Primary MD.  Get Medicines reviewed and adjusted: Please take all your medications with you for your next visit with your Primary MD  Laboratory/radiological data: Please request your Primary MD to go over all hospital tests and procedure/radiological results at the follow up, please ask your primary care provider to get all Hospital records sent to his/her office.  In some cases, they will be blood work, cultures and biopsy results pending at the time of your discharge. Please request that your primary care provider follow up on these results.  If you are diabetic, please bring your blood sugar readings with you to your follow up appointment with primary care.    Please call and make your follow up appointments as soon as possible.    Also Note the following: If you experience worsening of your admission symptoms, develop shortness of breath, life threatening emergency, suicidal or homicidal thoughts you must seek medical attention immediately by calling 911 or calling your MD immediately  if symptoms less severe.  You must read complete instructions/literature along with all the possible adverse reactions/side effects for all the Medicines you take and that have been prescribed to you. Take any new Medicines after you have completely understood and accpet all the possible adverse reactions/side effects.   Do not drive when taking Pain medications or sleeping medications (Benzodiazepines)  Do not take more than prescribed Pain, Sleep and Anxiety Medications. It is not advisable to combine anxiety,sleep and pain medications without talking with your primary care  practitioner  Special Instructions: If you have smoked or chewed Tobacco  in the last 2 yrs please stop smoking, stop any regular Alcohol  and or any Recreational drug use.  Wear Seat belts while driving.

## 2019-02-07 ENCOUNTER — Emergency Department (HOSPITAL_COMMUNITY): Payer: Medicare HMO

## 2019-02-07 ENCOUNTER — Other Ambulatory Visit: Payer: Self-pay

## 2019-02-07 ENCOUNTER — Encounter (HOSPITAL_COMMUNITY): Payer: Self-pay | Admitting: *Deleted

## 2019-02-07 ENCOUNTER — Inpatient Hospital Stay (HOSPITAL_COMMUNITY)
Admission: EM | Admit: 2019-02-07 | Discharge: 2019-02-10 | DRG: 208 | Disposition: A | Discharge status: Home Health | Payer: Medicare HMO | Attending: Internal Medicine | Admitting: Internal Medicine

## 2019-02-07 DIAGNOSIS — Z87891 Personal history of nicotine dependence: Secondary | ICD-10-CM | POA: Diagnosis not present

## 2019-02-07 DIAGNOSIS — R000 Tachycardia, unspecified: Secondary | ICD-10-CM | POA: Diagnosis present

## 2019-02-07 DIAGNOSIS — Z7952 Long term (current) use of systemic steroids: Secondary | ICD-10-CM | POA: Diagnosis not present

## 2019-02-07 DIAGNOSIS — J440 Chronic obstructive pulmonary disease with acute lower respiratory infection: Secondary | ICD-10-CM | POA: Diagnosis present

## 2019-02-07 DIAGNOSIS — Z86711 Personal history of pulmonary embolism: Secondary | ICD-10-CM

## 2019-02-07 DIAGNOSIS — R634 Abnormal weight loss: Secondary | ICD-10-CM | POA: Diagnosis present

## 2019-02-07 DIAGNOSIS — I10 Essential (primary) hypertension: Secondary | ICD-10-CM | POA: Diagnosis present

## 2019-02-07 DIAGNOSIS — F419 Anxiety disorder, unspecified: Secondary | ICD-10-CM | POA: Diagnosis present

## 2019-02-07 DIAGNOSIS — R079 Chest pain, unspecified: Secondary | ICD-10-CM

## 2019-02-07 DIAGNOSIS — Z515 Encounter for palliative care: Secondary | ICD-10-CM | POA: Diagnosis not present

## 2019-02-07 DIAGNOSIS — K219 Gastro-esophageal reflux disease without esophagitis: Secondary | ICD-10-CM | POA: Diagnosis present

## 2019-02-07 DIAGNOSIS — R945 Abnormal results of liver function studies: Secondary | ICD-10-CM | POA: Diagnosis not present

## 2019-02-07 DIAGNOSIS — R0609 Other forms of dyspnea: Secondary | ICD-10-CM | POA: Diagnosis not present

## 2019-02-07 DIAGNOSIS — E1165 Type 2 diabetes mellitus with hyperglycemia: Secondary | ICD-10-CM | POA: Diagnosis not present

## 2019-02-07 DIAGNOSIS — Z79899 Other long term (current) drug therapy: Secondary | ICD-10-CM

## 2019-02-07 DIAGNOSIS — E1169 Type 2 diabetes mellitus with other specified complication: Secondary | ICD-10-CM | POA: Diagnosis not present

## 2019-02-07 DIAGNOSIS — I2699 Other pulmonary embolism without acute cor pulmonale: Secondary | ICD-10-CM | POA: Diagnosis not present

## 2019-02-07 DIAGNOSIS — I700 Atherosclerosis of aorta: Secondary | ICD-10-CM | POA: Diagnosis present

## 2019-02-07 DIAGNOSIS — Z794 Long term (current) use of insulin: Secondary | ICD-10-CM

## 2019-02-07 DIAGNOSIS — R0602 Shortness of breath: Secondary | ICD-10-CM | POA: Diagnosis present

## 2019-02-07 DIAGNOSIS — E119 Type 2 diabetes mellitus without complications: Secondary | ICD-10-CM

## 2019-02-07 DIAGNOSIS — Z20828 Contact with and (suspected) exposure to other viral communicable diseases: Secondary | ICD-10-CM | POA: Diagnosis present

## 2019-02-07 DIAGNOSIS — E43 Unspecified severe protein-calorie malnutrition: Secondary | ICD-10-CM | POA: Diagnosis present

## 2019-02-07 DIAGNOSIS — I2510 Atherosclerotic heart disease of native coronary artery without angina pectoris: Secondary | ICD-10-CM | POA: Diagnosis present

## 2019-02-07 DIAGNOSIS — J9621 Acute and chronic respiratory failure with hypoxia: Secondary | ICD-10-CM | POA: Diagnosis present

## 2019-02-07 DIAGNOSIS — I248 Other forms of acute ischemic heart disease: Secondary | ICD-10-CM | POA: Diagnosis present

## 2019-02-07 DIAGNOSIS — Z7951 Long term (current) use of inhaled steroids: Secondary | ICD-10-CM

## 2019-02-07 DIAGNOSIS — Z7901 Long term (current) use of anticoagulants: Secondary | ICD-10-CM

## 2019-02-07 DIAGNOSIS — Z9981 Dependence on supplemental oxygen: Secondary | ICD-10-CM

## 2019-02-07 DIAGNOSIS — G8929 Other chronic pain: Secondary | ICD-10-CM | POA: Diagnosis present

## 2019-02-07 DIAGNOSIS — Z6820 Body mass index (BMI) 20.0-20.9, adult: Secondary | ICD-10-CM | POA: Diagnosis not present

## 2019-02-07 DIAGNOSIS — J441 Chronic obstructive pulmonary disease with (acute) exacerbation: Principal | ICD-10-CM | POA: Diagnosis present

## 2019-02-07 DIAGNOSIS — Z833 Family history of diabetes mellitus: Secondary | ICD-10-CM

## 2019-02-07 DIAGNOSIS — R0789 Other chest pain: Secondary | ICD-10-CM | POA: Diagnosis not present

## 2019-02-07 DIAGNOSIS — J189 Pneumonia, unspecified organism: Secondary | ICD-10-CM | POA: Diagnosis present

## 2019-02-07 DIAGNOSIS — I4510 Unspecified right bundle-branch block: Secondary | ICD-10-CM | POA: Diagnosis present

## 2019-02-07 DIAGNOSIS — Z7189 Other specified counseling: Secondary | ICD-10-CM

## 2019-02-07 LAB — BLOOD GAS, ARTERIAL
Acid-Base Excess: 6.6 mmol/L — ABNORMAL HIGH (ref 0.0–2.0)
Bicarbonate: 30 mmol/L — ABNORMAL HIGH (ref 20.0–28.0)
FIO2: 40
O2 Saturation: 92.3 %
Patient temperature: 37
pCO2 arterial: 44.3 mmHg (ref 32.0–48.0)
pH, Arterial: 7.455 — ABNORMAL HIGH (ref 7.350–7.450)
pO2, Arterial: 66.5 mmHg — ABNORMAL LOW (ref 83.0–108.0)

## 2019-02-07 LAB — COMPREHENSIVE METABOLIC PANEL
ALT: 78 U/L — ABNORMAL HIGH (ref 0–44)
AST: 19 U/L (ref 15–41)
Albumin: 1.8 g/dL — ABNORMAL LOW (ref 3.5–5.0)
Alkaline Phosphatase: 161 U/L — ABNORMAL HIGH (ref 38–126)
Anion gap: 9 (ref 5–15)
BUN: 18 mg/dL (ref 8–23)
CO2: 28 mmol/L (ref 22–32)
Calcium: 9.2 mg/dL (ref 8.9–10.3)
Chloride: 95 mmol/L — ABNORMAL LOW (ref 98–111)
Creatinine, Ser: 0.78 mg/dL (ref 0.61–1.24)
GFR calc Af Amer: >60 mL/min (ref >60)
GFR calc non Af Amer: >60 mL/min (ref >60)
Glucose, Bld: 257 mg/dL — ABNORMAL HIGH (ref 70–99)
Potassium: 4.6 mmol/L (ref 3.5–5.1)
Sodium: 132 mmol/L — ABNORMAL LOW (ref 135–145)
Total Bilirubin: 0.5 mg/dL (ref 0.3–1.2)
Total Protein: 6.4 g/dL — ABNORMAL LOW (ref 6.5–8.1)

## 2019-02-07 LAB — CBC
HCT: 33.1 % — ABNORMAL LOW (ref 39.0–52.0)
Hemoglobin: 10.4 g/dL — ABNORMAL LOW (ref 13.0–17.0)
MCH: 27 pg (ref 26.0–34.0)
MCHC: 31.4 g/dL (ref 30.0–36.0)
MCV: 86 fL (ref 80.0–100.0)
Platelets: 190 10*3/uL (ref 150–400)
RBC: 3.85 MIL/uL — ABNORMAL LOW (ref 4.22–5.81)
RDW: 15.2 % (ref 11.5–15.5)
WBC: 23.4 10*3/uL — ABNORMAL HIGH (ref 4.0–10.5)
nRBC: 0 % (ref 0.0–0.2)

## 2019-02-07 LAB — SARS CORONAVIRUS 2 BY RT PCR (HOSPITAL ORDER, PERFORMED IN ~~LOC~~ HOSPITAL LAB): SARS Coronavirus 2: NEGATIVE

## 2019-02-07 MED ORDER — LORATADINE 10 MG PO TABS
10.0000 mg | ORAL_TABLET | Freq: Every day | ORAL | Status: DC
  Administered 2019-02-08 – 2019-02-10 (×3): 10 mg via ORAL
  Filled 2019-02-07 (×3): qty 1

## 2019-02-07 MED ORDER — IPRATROPIUM BROMIDE HFA 17 MCG/ACT IN AERS
2.0000 | INHALATION_SPRAY | Freq: Once | RESPIRATORY_TRACT | Status: DC
  Filled 2019-02-07: qty 12.9

## 2019-02-07 MED ORDER — GUAIFENESIN ER 600 MG PO TB12
1200.0000 mg | ORAL_TABLET | Freq: Two times a day (BID) | ORAL | Status: DC
  Administered 2019-02-08 – 2019-02-10 (×6): 1200 mg via ORAL
  Filled 2019-02-07 (×6): qty 2

## 2019-02-07 MED ORDER — INSULIN ASPART 100 UNIT/ML ~~LOC~~ SOLN
0.0000 [IU] | Freq: Three times a day (TID) | SUBCUTANEOUS | Status: DC
  Administered 2019-02-08: 3 [IU] via SUBCUTANEOUS

## 2019-02-07 MED ORDER — FLUTICASONE PROPIONATE 50 MCG/ACT NA SUSP
2.0000 | Freq: Every day | NASAL | Status: DC
  Administered 2019-02-08 – 2019-02-10 (×3): 2 via NASAL
  Filled 2019-02-07: qty 16

## 2019-02-07 MED ORDER — ACETAMINOPHEN 325 MG PO TABS: 650.0000 mg | ORAL_TABLET | Freq: Four times a day (QID) | ORAL | Status: DC | PRN

## 2019-02-07 MED ORDER — METFORMIN HCL ER 500 MG PO TB24
1000.0000 mg | ORAL_TABLET | Freq: Two times a day (BID) | ORAL | Status: DC
  Administered 2019-02-09 – 2019-02-10 (×3): 1000 mg via ORAL
  Filled 2019-02-07 (×4): qty 2

## 2019-02-07 MED ORDER — ACETAMINOPHEN 650 MG RE SUPP: 650.0000 mg | Freq: Four times a day (QID) | RECTAL | Status: DC | PRN

## 2019-02-07 MED ORDER — METHYLPREDNISOLONE SODIUM SUCC 125 MG IJ SOLR
80.0000 mg | Freq: Three times a day (TID) | INTRAMUSCULAR | Status: DC
  Administered 2019-02-08 (×2): 80 mg via INTRAVENOUS
  Filled 2019-02-07 (×2): qty 2

## 2019-02-07 MED ORDER — SODIUM CHLORIDE 0.9 % IV SOLN
2.0000 g | Freq: Once | INTRAVENOUS | Status: AC
  Administered 2019-02-07: 2 g via INTRAVENOUS
  Filled 2019-02-07: qty 2

## 2019-02-07 MED ORDER — ALBUTEROL SULFATE (2.5 MG/3ML) 0.083% IN NEBU: 2.5000 mg | INHALATION_SOLUTION | Freq: Four times a day (QID) | RESPIRATORY_TRACT | Status: DC | PRN

## 2019-02-07 MED ORDER — ORAL CARE MOUTH RINSE
15.0000 mL | Freq: Two times a day (BID) | OROMUCOSAL | Status: DC
  Administered 2019-02-08 – 2019-02-09 (×4): 15 mL via OROMUCOSAL

## 2019-02-07 MED ORDER — CHLORHEXIDINE GLUCONATE 0.12 % MT SOLN
15.0000 mL | Freq: Two times a day (BID) | OROMUCOSAL | Status: DC
  Administered 2019-02-08 – 2019-02-10 (×5): 15 mL via OROMUCOSAL
  Filled 2019-02-07 (×5): qty 15

## 2019-02-07 MED ORDER — APIXABAN 5 MG PO TABS: 5.0000 mg | ORAL_TABLET | Freq: Two times a day (BID) | ORAL | Status: DC

## 2019-02-07 MED ORDER — PANTOPRAZOLE SODIUM 40 MG PO TBEC
40.0000 mg | DELAYED_RELEASE_TABLET | Freq: Every morning | ORAL | Status: DC
  Administered 2019-02-08 – 2019-02-10 (×3): 40 mg via ORAL
  Filled 2019-02-07 (×3): qty 1

## 2019-02-07 MED ORDER — SODIUM CHLORIDE 0.9% FLUSH
3.0000 mL | Freq: Two times a day (BID) | INTRAVENOUS | Status: DC
  Administered 2019-02-08 – 2019-02-10 (×5): 3 mL via INTRAVENOUS

## 2019-02-07 MED ORDER — ALBUTEROL SULFATE (2.5 MG/3ML) 0.083% IN NEBU: 2.5000 mg | INHALATION_SOLUTION | Freq: Four times a day (QID) | RESPIRATORY_TRACT | Status: DC

## 2019-02-07 MED ORDER — MONTELUKAST SODIUM 10 MG PO TABS
10.0000 mg | ORAL_TABLET | Freq: Every morning | ORAL | Status: DC
  Administered 2019-02-08 – 2019-02-10 (×3): 10 mg via ORAL
  Filled 2019-02-07 (×3): qty 1

## 2019-02-07 MED ORDER — ADULT MULTIVITAMIN W/MINERALS CH
1.0000 | ORAL_TABLET | Freq: Every day | ORAL | Status: DC
  Administered 2019-02-08 – 2019-02-10 (×3): 1 via ORAL
  Filled 2019-02-07 (×3): qty 1

## 2019-02-07 MED ORDER — METHYLPREDNISOLONE SODIUM SUCC 125 MG IJ SOLR
125.0000 mg | Freq: Once | INTRAMUSCULAR | Status: AC
  Administered 2019-02-07: 20:00:00 125 mg via INTRAVENOUS
  Filled 2019-02-07 (×2): qty 2

## 2019-02-07 MED ORDER — ALBUTEROL SULFATE HFA 108 (90 BASE) MCG/ACT IN AERS
4.0000 | INHALATION_SPRAY | Freq: Once | RESPIRATORY_TRACT | Status: AC
  Administered 2019-02-07: 4 via RESPIRATORY_TRACT

## 2019-02-07 MED ORDER — SODIUM CHLORIDE 0.9 % IV SOLN
250.0000 mL | INTRAVENOUS | Status: DC | PRN
  Administered 2019-02-09: 22:00:00 250 mL via INTRAVENOUS

## 2019-02-07 MED ORDER — SODIUM CHLORIDE 0.9% FLUSH
3.0000 mL | INTRAVENOUS | Status: DC | PRN
  Administered 2019-02-09: 3 mL via INTRAVENOUS
  Filled 2019-02-07: qty 3

## 2019-02-07 MED ORDER — INSULIN ASPART 100 UNIT/ML ~~LOC~~ SOLN
0.0000 [IU] | Freq: Every day | SUBCUTANEOUS | Status: DC
  Administered 2019-02-08: 2 [IU] via SUBCUTANEOUS
  Administered 2019-02-08: 3 [IU] via SUBCUTANEOUS

## 2019-02-07 MED ORDER — APIXABAN 5 MG PO TABS
10.0000 mg | ORAL_TABLET | Freq: Two times a day (BID) | ORAL | Status: DC
  Administered 2019-02-08 – 2019-02-10 (×6): 10 mg via ORAL
  Filled 2019-02-07 (×6): qty 2

## 2019-02-07 MED ORDER — ALBUTEROL SULFATE HFA 108 (90 BASE) MCG/ACT IN AERS
6.0000 | INHALATION_SPRAY | Freq: Once | RESPIRATORY_TRACT | Status: AC
  Administered 2019-02-07: 6 via RESPIRATORY_TRACT
  Filled 2019-02-07: qty 6.7

## 2019-02-07 MED ORDER — OXYBUTYNIN CHLORIDE 5 MG PO TABS
5.0000 mg | ORAL_TABLET | Freq: Two times a day (BID) | ORAL | Status: DC
  Administered 2019-02-08 – 2019-02-10 (×6): 5 mg via ORAL
  Filled 2019-02-07 (×6): qty 1

## 2019-02-07 MED ORDER — PRO-STAT SUGAR FREE PO LIQD
30.0000 mL | Freq: Two times a day (BID) | ORAL | Status: DC
  Administered 2019-02-08 – 2019-02-10 (×6): 30 mL via ORAL
  Filled 2019-02-07 (×7): qty 30

## 2019-02-07 MED ORDER — ALPRAZOLAM 0.5 MG PO TABS
0.5000 mg | ORAL_TABLET | Freq: Two times a day (BID) | ORAL | Status: DC | PRN
  Administered 2019-02-09 – 2019-02-10 (×2): 0.5 mg via ORAL
  Filled 2019-02-07 (×2): qty 1

## 2019-02-07 MED ORDER — INSULIN GLARGINE 100 UNIT/ML ~~LOC~~ SOLN
15.0000 [IU] | Freq: Every day | SUBCUTANEOUS | Status: DC
  Administered 2019-02-08 – 2019-02-09 (×3): 15 [IU] via SUBCUTANEOUS
  Filled 2019-02-07 (×5): qty 0.15

## 2019-02-07 MED ORDER — FLUTICASONE-UMECLIDIN-VILANT 100-62.5-25 MCG/INH IN AEPB: 2.0000 | INHALATION_SPRAY | Freq: Every day | RESPIRATORY_TRACT | Status: DC

## 2019-02-07 MED ORDER — VANCOMYCIN HCL 10 G IV SOLR
1500.0000 mg | Freq: Once | INTRAVENOUS | Status: AC
  Administered 2019-02-08: 1500 mg via INTRAVENOUS
  Filled 2019-02-07 (×2): qty 1500

## 2019-02-07 NOTE — H&P (Addendum)
TRH H&P    Patient Demographics:    Jeff Ray, is a 63 y.o. male  MRN: [id number]  DOB - 1955-11-01  Admit Date - 02/07/2019  Referring MD/NP/PA:   Alphonzo Lemmings  Outpatient Primary MD for the patient is Lemmie Evens, MD  Patient coming from:  home  Chief complaint-  dyspnea   HPI:    Jeff Ray  is a 63 y.o. male,w severe Copd on home o2, h/o PE, Dm2, Hypertension, w recent admission for acute respiratory failure/ PE, pneumonia,  apparently c/o acute dyspnea starting earlier today.  Pt tried nebulizer treatment at home without success.  Admits to cough with clear/ grey sputum.  Pt notes left sided chest pain >2 months. Unchanged. Pt states that he has been compliant with Eliquis.  Pt denies fever, chills, palp, n/v, abd pain, diarrhea, brbpr, black stool, dysuria, hematuria, wt loss.    In ED,  T 97.8  P 107  Bp 102/70  pox 83% on 3L per home health nurse.    CXR IMPRESSION: Stable changes in the left upper lobe when compare with the prior CT examination from 2 days previous.  Na 132, K 4.6, Bun 18, Creatinine 0.78  Glucose 257 Ast 19, Alt 78  Alk phos 161, T. Bili 0.5 Alb 1.8  Wbc 23.4, Hgb 10.4, Plt 190  Ph 7.455, Pco2 44.3, Po2 66.5 (prev 89.2)  Covid negative   Pt received albuterol HFA 10 puffs, atrovent HFA 4 puffs, solumedrol 175m iv x1 without improvement   Pt will be admitted for acute hypoxic respiratory failure on chronic respiratory failure secondary to Copd exacerbation, tachycardia, leukoctyosis, abnormal liver function, chronic chest pain.       Review of systems:    In addition to the HPI above,  No Fever-chills, No Headache, No changes with Vision or hearing, No problems swallowing food or Liquids, No Abdominal pain, No Nausea or Vomiting, bowel movements are regular, No Blood in stool or Urine, No dysuria, No new skin rashes or bruises, No new joints  pains-aches,  No new weakness, tingling, numbness in any extremity, + weight  Loss, (wt 83.8 kg on 12/11/18) No polyuria, polydypsia or polyphagia, No significant Mental Stressors.  All other systems reviewed and are negative.    Past History of the following :    Past Medical History:  Diagnosis Date  . Anxiety   . Asthma   . COPD (chronic obstructive pulmonary disease) ([place]   . Diabetes mellitus   . History of nuclear stress test 02/2016   low risk study  . Hypertension   . On home O2    2L N/C   . Pneumonia       Past Surgical History:  Procedure Laterality Date  . BACK SURGERY        Social History:      Social History   Tobacco Use  . Smoking status: Former Smoker    Packs/day: 0.00    Years: 10.00    Pack years: 0.00    Types: Cigarettes  Last attempt to quit: 10/20/2012    Years since quitting: 6.3  . Smokeless tobacco: Never Used  Substance Use Topics  . Alcohol use: No    Alcohol/week: 0.0 standard drinks       Family History :     Family History  Problem Relation Age of Onset  . Diabetes Mother        Home Medications:   Prior to Admission medications   Medication Sig Start Date End Date Taking? Authorizing Provider  albuterol (PROAIR HFA) 108 (90 BASE) MCG/ACT inhaler Inhale 2 puffs into the lungs every 6 (six) hours as needed for wheezing or shortness of breath. Shortness of Breath   Yes [provider]  ALPRAZolam (XANAX) 1 MG tablet Take 0.5 tablets (0.5 mg total) by mouth 2 (two) times daily as needed for anxiety. 01/19/19  Yes Domenic Polite, MD  apixaban (ELIQUIS) 5 MG TABS tablet Take 2 tabs po BID thru 5/10, then 1 po BID starting 5/11 Patient taking differently: Take 5-10 mg by mouth See admin instructions. Take 2 tabs by mouth twice daily thru 5/10, then 1 tab twice daily starting 5/11 02/05/19  Yes Johnson, Clanford L, MD  fexofenadine-pseudoephedrine (ALLEGRA-D 24) 180-240 MG 24 hr tablet Take 1 tablet by mouth daily  as needed (allergies). 02/05/19  Yes Johnson, Clanford L, MD  fluticasone (FLONASE) 50 MCG/ACT nasal spray Place 2 sprays into both nostrils daily. 09/30/17  Yes [provider]  Guaifenesin (MUCINEX MAXIMUM STRENGTH) 1200 MG TB12 Take 1 tablet (1,200 mg total) by mouth 2 (two) times daily. 12/11/18  Yes Emokpae, Courage, MD  Insulin Glargine (LANTUS) 100 UNIT/ML Solostar Pen Inject 15 Units into the skin at bedtime. 02/05/19  Yes Johnson, Clanford L, MD  ipratropium-albuterol (DUONEB) 0.5-2.5 (3) MG/3ML SOLN Take 3 mLs by nebulization every 4 (four) hours as needed. For shortness of breath   Yes [provider]  metFORMIN (GLUCOPHAGE-XR) 500 MG 24 hr tablet Take 2 tablets (1,000 mg total) by mouth 2 (two) times daily. 02/01/19  Yes Johnson, Clanford L, MD  montelukast (SINGULAIR) 10 MG tablet Take 10 mg by mouth every morning.    Yes [provider]  Multiple Vitamins-Minerals (CENTRUM SILVER 50+MEN PO) Take 1 tablet by mouth daily.   Yes [provider]  oxybutynin (DITROPAN) 5 MG tablet Take 1 tablet (5 mg total) by mouth 2 (two) times daily. 01/19/19  Yes Domenic Polite, MD  pantoprazole (PROTONIX) 40 MG tablet Take 40 mg by mouth every morning.    Yes [provider]  predniSONE (DELTASONE) 20 MG tablet Take 2 tabs QAM x 5 days, then 1 tab QAM x 5 days Patient taking differently: Take 20-40 mg by mouth See admin instructions. Take 2 tabs QAM x 5 days, then 1 tab QAM x 5 days starting on 02/05/2019 02/05/19  Yes Johnson, Clanford L, MD  rosuvastatin (CRESTOR) 40 MG tablet Take 40 mg by mouth at bedtime.  07/24/17  Yes [provider]  TRELEGY ELLIPTA 100-62.5-25 MCG/INH AEPB Take 2 puffs by mouth daily. 11/17/18  Yes [provider]     Allergies:    No Known Allergies   Physical Exam:   Vitals  Blood pressure 127/75, pulse (!) 107, temperature 97.8 F (36.6 C), temperature source Oral, resp. rate (!) 33, height '6\' 2"'  (1.88 m), weight  72.6 kg, SpO2 96 %.  1.  General: axox3  2. Psychiatric: euthymic  3. Neurologic: cn2-12 intact, reflexes 2+ symmetric, diffuse with no clonus, motor  5/5 in all 4 ext  4. HEENMT:  Anicteric, pupils 1.60m symmetric, direct, consensual, near intact Neck: no jvd, no bruit, no adenopathy  5. Respiratory : + bilateral exp wheezing, no crackles  6. Cardiovascular : Borderline tachycardia, s1, s2, no m/g/r  7. Gastrointestinal:  Abd: soft, nt, nd, +bs, no hsm  8. Skin:  Ext: no c/c/e, no rash  Condom catheter in place  9.Musculoskeletal:  Good ROM,  No adenopathy    Data Review:    CBC Recent Labs  Lab 02/01/19 1937 02/02/19 0509 02/07/19 1948  WBC 26.7* 24.3* 23.4*  HGB 12.5* 10.1* 10.4*  HCT 41.7 34.0* 33.1*  PLT 242 175 190  MCV 89.7 90.2 86.0  MCH 26.9 26.8 27.0  MCHC 30.0 29.7* 31.4  RDW 15.2 15.3 15.2   ------------------------------------------------------------------------------------------------------------------  Results for orders placed or performed during the hospital encounter of 02/07/19 (from the past 48 hour(s))  SARS Coronavirus 2 (CEPHEID - Performed in [facility]), Hosp Order     Status: None   Collection Time: 02/07/19  7:47 PM  Result Value Ref Range   SARS Coronavirus 2 NEGATIVE NEGATIVE    Comment: (NOTE) If result is NEGATIVE SARS-CoV-2 target nucleic acids are NOT DETECTED. The SARS-CoV-2 RNA is generally detectable in upper and lower  respiratory specimens during the acute phase of infection. The lowest  concentration of SARS-CoV-2 viral copies this assay can detect is 250  copies / mL. A negative result does not preclude SARS-CoV-2 infection  and should not be used as the sole basis for treatment or other  patient management decisions.  A negative result may occur with  improper specimen collection / handling, submission of specimen other  than nasopharyngeal swab, presence of viral mutation(s) within the  areas  targeted by this assay, and inadequate number of viral copies  (<250 copies / mL). A negative result must be combined with clinical  observations, patient history, and epidemiological information. If result is POSITIVE SARS-CoV-2 target nucleic acids are DETECTED. The SARS-CoV-2 RNA is generally detectable in upper and lower  respiratory specimens dur ing the acute phase of infection.  Positive  results are indicative of active infection with SARS-CoV-2.  Clinical  correlation with patient history and other diagnostic information is  necessary to determine patient infection status.  Positive results do  not rule out bacterial infection or co-infection with other viruses. If result is PRESUMPTIVE POSTIVE SARS-CoV-2 nucleic acids MAY BE PRESENT.   A presumptive positive result was obtained on the submitted specimen  and confirmed on repeat testing.  While 2019 novel coronavirus  (SARS-CoV-2) nucleic acids may be present in the submitted sample  additional confirmatory testing may be necessary for epidemiological  and / or clinical management purposes  to differentiate between  SARS-CoV-2 and other Sarbecovirus currently known to infect humans.  If clinically indicated additional testing with an alternate test  methodology [phone] is advised. The SARS-CoV-2 RNA is generally  detectable in upper and lower respiratory sp ecimens during the acute  phase of infection. The expected result is Negative. Fact Sheet for Patients:  hStrictlyIdeas.noFact Sheet for Healthcare Providers: hBankingDealers.co.zaThis test is not yet approved or cleared by the [place] and has been authorized for detection and/or diagnosis of SARS-CoV-2 by FDA under an Emergency Use Authorization (EUA).  This EUA will remain in effect (meaning this test can be used) for the duration of the COVID-19 declaration under Section 564(b)(1) of the Act, 21 U.S.C. section  360bbb-3(b)(1), unless the authorization is terminated or revoked sooner. Performed at [facility], [address], [place], [place] [address]   CBC     Status: Abnormal   Collection Time: 02/07/19  7:48 PM  Result Value Ref Range   WBC 23.4 (H) 4.0 - 10.5 K/uL   RBC 3.85 (L) 4.22 - 5.81 MIL/uL   Hemoglobin 10.4 (L) 13.0 - 17.0 g/dL   HCT 33.1 (L) 39.0 - 52.0 %   MCV 86.0 80.0 - 100.0 fL   MCH 27.0 26.0 - 34.0 pg   MCHC 31.4 30.0 - 36.0 g/dL   RDW 15.2 11.5 - 15.5 %   Platelets 190 150 - 400 K/uL   nRBC 0.0 0.0 - 0.2 %    Comment: Performed at [facility], [address] [place], [place] [address]  Comprehensive metabolic panel     Status: Abnormal   Collection Time: 02/07/19  7:48 PM  Result Value Ref Range   Sodium 132 (L) 135 - 145 mmol/L   Potassium 4.6 3.5 - 5.1 mmol/L   Chloride 95 (L) 98 - 111 mmol/L   CO2 28 22 - 32 mmol/L   Glucose, Bld 257 (H) 70 - 99 mg/dL   BUN 18 8 - 23 mg/dL   Creatinine, Ser 0.78 0.61 - 1.24 mg/dL   Calcium 9.2 8.9 - 10.3 mg/dL   Total Protein 6.4 (L) 6.5 - 8.1 g/dL   Albumin 1.8 (L) 3.5 - 5.0 g/dL   AST 19 15 - 41 U/L   ALT 78 (H) 0 - 44 U/L   Alkaline Phosphatase 161 (H) 38 - 126 U/L   Total Bilirubin 0.5 0.3 - 1.2 mg/dL   GFR calc non Af Amer >60 >60 mL/min   GFR calc Af Amer >60 >60 mL/min   Anion gap 9 5 - 15    Comment: Performed at [facility], [address] [place], [place] [address]  Blood gas, arterial     Status: Abnormal   Collection Time: 02/07/19  8:05 PM  Result Value Ref Range   FIO2 40.00    pH, Arterial 7.455 (H) 7.350 - 7.450   pCO2 arterial 44.3 32.0 - 48.0 mmHg   pO2, Arterial 66.5 (L) 83.0 - 108.0 mmHg   Bicarbonate 30.0 (H) 20.0 - 28.0 mmol/L   Acid-Base Excess 6.6 (H) 0.0 - 2.0 mmol/L   O2 Saturation 92.3 %   Patient temperature 37.0    Allens test (pass/fail) PASS PASS    Comment: Performed at [facility], [address], [place], [place] [address]    Chemistries  Recent Labs  Lab 02/01/19 1937  02/02/19 0509 02/07/19 1948  NA 139 139 132*  K 4.2 4.7 4.6  CL 98 102 95*  CO2 '31 28 28  ' GLUCOSE 69* 66* 257*  BUN '22 21 18  ' CREATININE 1.01 0.82 0.78  CALCIUM 9.8 9.0 9.2  AST  --   --  19  ALT  --   --  78*  ALKPHOS  --   --  161*  BILITOT  --   --  0.5   ------------------------------------------------------------------------------------------------------------------  ------------------------------------------------------------------------------------------------------------------ GFR: Estimated Creatinine Clearance: 95.8 mL/min (by C-G formula based on SCr of 0.78 mg/dL). Liver Function Tests: Recent Labs  Lab 02/07/19 1948  AST 19  ALT 78*  ALKPHOS 161*  BILITOT 0.5  PROT 6.4*  ALBUMIN 1.8*   No results for input(s): LIPASE, AMYLASE in the last 168 hours. No results for input(s): AMMONIA in the last 168  hours. Coagulation Profile: No results for input(s): INR, PROTIME in the last 168 hours. Cardiac Enzymes: No results for input(s): CKTOTAL, CKMB, CKMBINDEX, TROPONINI in the last 168 hours. BNP (last 3 results) No results for input(s): PROBNP in the last 8760 hours. HbA1C: No results for input(s): HGBA1C in the last 72 hours. CBG: Recent Labs  Lab 02/04/19 1626 02/04/19 2124 02/05/19 0317 02/05/19 0738 02/05/19 1133  GLUCAP 303* 238* 234* 185* 219*   Lipid Profile: No results for input(s): CHOL, HDL, LDLCALC, TRIG, CHOLHDL, LDLDIRECT in the last 72 hours. Thyroid Function Tests: No results for input(s): TSH, T4TOTAL, FREET4, T3FREE, THYROIDAB in the last 72 hours. Anemia Panel: No results for input(s): VITAMINB12, FOLATE, FERRITIN, TIBC, IRON, RETICCTPCT in the last 72 hours.  --------------------------------------------------------------------------------------------------------------- Urine analysis:    Component Value Date/Time   COLORURINE YELLOW 01/25/2019 0743   APPEARANCEUR HAZY (A) 01/25/2019 0743   LABSPEC 1.027 01/25/2019 0743   PHURINE  7.0 01/25/2019 0743   GLUCOSEU 150 (A) 01/25/2019 0743   HGBUR NEGATIVE 01/25/2019 0743   BILIRUBINUR NEGATIVE 01/25/2019 0743   KETONESUR NEGATIVE 01/25/2019 0743   PROTEINUR 30 (A) 01/25/2019 0743   UROBILINOGEN 0.2 02/08/2008 1731   NITRITE NEGATIVE 01/25/2019 0743   LEUKOCYTESUR NEGATIVE 01/25/2019 0743      Imaging Results:    Dg Chest Port 1 View  Result Date: 02/07/2019 CLINICAL DATA:  Shortness of breath EXAM: PORTABLE CHEST 1 VIEW COMPARISON:  02/05/2019 FINDINGS: Cardiac shadow is within normal limits. The right lung is hyperaerated consistent with COPD and stable from the previous exam. Chronic consolidative densities are noted in the left upper lobe stable from the prior exam. No new focal abnormality is noted. IMPRESSION: Stable changes in the left upper lobe when compare with the prior CT examination from 2 days previous. Electronically Signed   By: Inez Catalina M.D.   On: 02/07/2019 20:32    ekg ST at 110, borderline LAD, RBBB   Assessment & Plan:    Principal Problem:   COPD exacerbation ([place]) Active Problems:   HTN (hypertension)   DM (diabetes mellitus) ([facility])   Tachycardia   Abnormal liver function  Acute on Chronic hypoxic respiratory failure secondary to Copd exacerbation ddx h/o PE, pneumonia Solumedrol 78m iv q8h vanco iv, cefepime iv pharmacy to dose Cont Trelegy 1puff qday Cont Singulair 129mpo qday Cont Mucinex Albuterol neb qid and q6h prn sob Bipap at nite Consider pulmonary consultation in AM to evaluate, as well as abnormal CT 02/05/19  Consolidation w air bronchogram LUL prior CT chest 02/05/19 Blood culture x2 abx as above Please repeat CT chest in 4-6 weeks  Abnormal liver function / Wt loss (83kg=> 73kg since March 2020) ddx Crestor Hold Crestor  Check acute hepatitis panel Check CT abdomen pelvis Check cmp in AM  H/o PE Cont Eliquis pharmacy to dose  Tachycardia ? Secondary to breathing tx, ddx h/o PE Tele Trop I q6h x3 DC  Allegra D  Chronic chest pain (hx of coronary calcifications on CT chest) EF >55% on prior echo 12/2018 Tele Check TSH Trop I q6h x3 Cardiology consult for chronic chest pain  Dm2 fsbs qc an qhs,  Cont Lantus 15 units [place] qhs Cont Metformin 100022mo bid ISS  Allerghic rhinitis Cont Singulair 68m75m qday Cont Flonase NS Allegra 180mg74mqday (stop pseudoephedrine due to tachycardia)  Severe protein calorie malnutrition Start Prostat 30mL 35mid  Anxiety Cont Xanax prn, would try to taper this eventually  Gerd [place]  PPI   DVT Prophylaxis-   Eliquis  AM Labs Ordered, also please review Full Orders  Family Communication: Admission, patients condition and plan of care including tests being ordered have been discussed with the patient  who indicate understanding and agree with the plan and Code Status.  Code Status:  FULL CODE  Admission status: Inpatient: Based on patients clinical presentation and evaluation of above clinical data, I have made determination that patient meets Inpatient criteria at this time.  Pt has acute respiratory failure on chronic hypoxic respiratory failure And will require iv steroids as well as iv abx, for Copd exacerbation (failure of oral prednisone), ? Consolidation of LUL, due to his severe lung disease as evinced by prior intubation in the past he will likely require >2 nites stay and therefore inpatient status. Depending upon w/up of chest pain, this will probably contribute to inpatient status.   Time spent in minutes :  70   Jani Gravel M.D on 02/07/2019 at 10:28 PM

## 2019-02-07 NOTE — ED Provider Notes (Signed)
[facility] EMERGENCY DEPARTMENT Provider Note   CSN: [id number] Arrival date & time: 02/07/19  1936    History   Chief Complaint Chief Complaint  Patient presents with   Shortness of Breath    HPI Jeff Ray is a 63 y.o. male.     Patient with hx end stage copd, c/o increased sob in the past couple of days since hospital d/c. Symptoms gradual onset, severe, slowly worsening, persistent. Pt in and out of hospital in past couple months with acute on chronic resp failure. Pt has home bipap/cpap w some relief. Occasional non prod cough. No fever or chills. No chest pain or discomfort. No leg pain or swelling. Uses neb prn. Non smoker. Compliant w home meds. No known covid + exposure.     The history is provided by the patient.  Shortness of Breath  Associated symptoms: cough   Associated symptoms: no abdominal pain, no chest pain, no fever, no headaches, no neck pain, no rash, no sore throat and no vomiting     Past Medical History:  Diagnosis Date   Anxiety    Asthma    COPD (chronic obstructive pulmonary disease) (HCC)    Diabetes mellitus    History of nuclear stress test 02/2016   low risk study   Hypertension    On home O2    2L N/C    Pneumonia     Patient Active Problem List   Diagnosis Date Noted   Bilateral pulmonary embolism (HCC) 02/05/2019   Acute on chronic respiratory failure (HCC) 02/01/2019   Acute and chronic respiratory failure (acute-on-chronic) (HCC) 01/25/2019   HCAP (healthcare-associated pneumonia) 01/25/2019   On home O2 01/25/2019   Elevated lactic acid level 01/25/2019   Pneumonia 01/09/2019   Acute metabolic encephalopathy 01/09/2019   Diabetic ketoacidosis with coma associated with type 2 diabetes mellitus (HCC)    Acute respiratory failure with hypoxia (HCC) 10/28/2018   COPD with acute exacerbation (HCC) 09/09/2018   Chronic respiratory failure with hypoxia (HCC) 09/08/2018   Uncontrolled type 2 diabetes  mellitus with hyperglycemia (HCC) 09/08/2018   Elevated troponin 09/08/2018   SIRS (systemic inflammatory response syndrome) (HCC) 09/07/2018   Asthma 09/07/2018   HLD (hyperlipidemia) 09/07/2018   GERD (gastroesophageal reflux disease) 09/07/2018   Anxiety 09/07/2018   BPH (benign prostatic hyperplasia) 09/07/2018   Anemia 08/03/2015   Acute on chronic respiratory failure with hypercapnia (HCC) 05/26/2013   COPD exacerbation (HCC) 05/26/2013   HTN (hypertension) 05/26/2013   DM (diabetes mellitus) (HCC) 05/26/2013   Tobacco abuse 05/26/2013    Past Surgical History:  Procedure Laterality Date   BACK SURGERY          Home Medications    Prior to Admission medications   Medication Sig Start Date End Date Taking? Authorizing Provider  albuterol (PROAIR HFA) 108 (90 BASE) MCG/ACT inhaler Inhale 2 puffs into the lungs every 6 (six) hours as needed for wheezing or shortness of breath. Shortness of Breath    [provider]  ALPRAZolam (XANAX) 1 MG tablet Take 0.5 tablets (0.5 mg total) by mouth 2 (two) times daily as needed for anxiety. 01/19/19   Zannie Cove, MD  apixaban Everlene Balls) 5 MG TABS tablet Take 2 tabs po BID thru 5/10, then 1 po BID starting 5/11 02/05/19   Johnson, Clanford L, MD  fexofenadine-pseudoephedrine (ALLEGRA-D 24) 180-240 MG 24 hr tablet Take 1 tablet by mouth daily as needed (allergies). 02/05/19   Cleora Fleet, MD  fluticasone Aleda Grana)  50 MCG/ACT nasal spray Place 2 sprays into both nostrils daily. 09/30/17   [provider]  Guaifenesin (MUCINEX MAXIMUM STRENGTH) 1200 MG TB12 Take 1 tablet (1,200 mg total) by mouth 2 (two) times daily. 12/11/18   Shon Hale, MD  Insulin Glargine (LANTUS) 100 UNIT/ML Solostar Pen Inject 15 Units into the skin at bedtime. 02/05/19   Johnson, Clanford L, MD  ipratropium-albuterol (DUONEB) 0.5-2.5 (3) MG/3ML SOLN Take 3 mLs by nebulization every 4 (four) hours as needed. For shortness of  breath    [provider]  metFORMIN (GLUCOPHAGE-XR) 500 MG 24 hr tablet Take 2 tablets (1,000 mg total) by mouth 2 (two) times daily. 02/01/19   Johnson, Clanford L, MD  montelukast (SINGULAIR) 10 MG tablet Take 10 mg by mouth every morning.     [provider]  Multiple Vitamins-Minerals (CENTRUM SILVER 50+MEN PO) Take 1 tablet by mouth daily.    [provider]  oxybutynin (DITROPAN) 5 MG tablet Take 1 tablet (5 mg total) by mouth 2 (two) times daily. 01/19/19   Zannie Cove, MD  pantoprazole (PROTONIX) 40 MG tablet Take 40 mg by mouth every morning.     [provider]  predniSONE (DELTASONE) 20 MG tablet Take 2 tabs QAM x 5 days, then 1 tab QAM x 5 days 02/05/19   Laural Benes, Clanford L, MD  rosuvastatin (CRESTOR) 40 MG tablet Take 40 mg by mouth at bedtime.  07/24/17   [provider]  TRELEGY ELLIPTA 100-62.5-25 MCG/INH AEPB Take 2 puffs by mouth daily. 11/17/18   [provider]    Family History Family History  Problem Relation Age of Onset   Diabetes Mother     Social History Social History   Tobacco Use   Smoking status: Former Smoker    Packs/day: 0.00    Years: 10.00    Pack years: 0.00    Types: Cigarettes    Last attempt to quit: 10/20/2012    Years since quitting: 6.3   Smokeless tobacco: Never Used  Substance Use Topics   Alcohol use: No    Alcohol/week: 0.0 standard drinks   Drug use: No     Allergies   Patient has no known allergies.   Review of Systems Review of Systems  Constitutional: Negative for fever.  HENT: Negative for sore throat.   Eyes: Negative for redness.  Respiratory: Positive for cough and shortness of breath.   Cardiovascular: Negative for chest pain and leg swelling.  Gastrointestinal: Negative for abdominal pain, diarrhea and vomiting.  Endocrine: Negative for polyuria.  Genitourinary: Negative for flank pain.  Musculoskeletal: Negative for back pain and neck pain.  Skin:  Negative for rash.  Neurological: Negative for headaches.  Hematological: Does not bruise/bleed easily.  Psychiatric/Behavioral: Negative for confusion.     Physical Exam Updated Vital Signs Ht 1.88 m ( )    Wt 72.6 kg    BMI 20.54 kg/m   Physical Exam Vitals signs and nursing note reviewed.  Constitutional:      Appearance: Normal appearance. He is well-developed.  HENT:     Head: Atraumatic.     Nose: Nose normal.     Mouth/Throat:     Mouth: Mucous membranes are moist.     Pharynx: Oropharynx is clear.  Eyes:     General: No scleral icterus.    Conjunctiva/sclera: Conjunctivae normal.  Neck:     Musculoskeletal: Normal range of motion and neck supple. No neck rigidity.     Trachea: No  tracheal deviation.  Cardiovascular:     Rate and Rhythm: Regular rhythm. Tachycardia present.     Pulses: Normal pulses.     Heart sounds: Normal heart sounds. No murmur. No friction rub. No gallop.   Pulmonary:     Effort: Respiratory distress present. No accessory muscle usage.     Breath sounds: Wheezing present.     Comments: Decreased air movement bil.  Abdominal:     General: Bowel sounds are normal. There is no distension.     Palpations: Abdomen is soft.     Tenderness: There is no abdominal tenderness. There is no guarding.  Genitourinary:    Comments: No cva tenderness. Musculoskeletal:        General: No swelling or tenderness.     Right lower leg: No edema.     Left lower leg: No edema.  Skin:    General: Skin is warm and dry.     Findings: No rash.  Neurological:     Mental Status: He is alert and oriented to person, place, and time.     Comments: Alert, speech clear.   Psychiatric:        Mood and Affect: Mood normal.      ED Treatments / Results  Labs (all labs ordered are listed, but only abnormal results are displayed) Results for orders placed or performed during the hospital encounter of 02/07/19  SARS Coronavirus 2 (CEPHEID - Performed in [facility] hospital lab), [facility] Order  Result Value Ref Range   SARS Coronavirus 2 NEGATIVE NEGATIVE  CBC  Result Value Ref Range   WBC 23.4 (H) 4.0 - 10.5 K/uL   RBC 3.85 (L) 4.22 - 5.81 MIL/uL   Hemoglobin 10.4 (L) 13.0 - 17.0 g/dL   HCT 16.1 (L) 09.6 - 04.5 %   MCV 86.0 80.0 - 100.0 fL   MCH 27.0 26.0 - 34.0 pg   MCHC 31.4 30.0 - 36.0 g/dL   RDW 40.9 81.1 - 91.4 %   Platelets 190 150 - 400 K/uL   nRBC 0.0 0.0 - 0.2 %  Blood gas, arterial  Result Value Ref Range   FIO2 40.00    pH, Arterial 7.455 (H) 7.350 - 7.450   pCO2 arterial 44.3 32.0 - 48.0 mmHg   pO2, Arterial 66.5 (L) 83.0 - 108.0 mmHg   Bicarbonate 30.0 (H) 20.0 - 28.0 mmol/L   Acid-Base Excess 6.6 (H) 0.0 - 2.0 mmol/L   O2 Saturation 92.3 %   Patient temperature 37.0    Allens test (pass/fail) PASS PASS  Comprehensive metabolic panel  Result Value Ref Range   Sodium 132 (L) 135 - 145 mmol/L   Potassium 4.6 3.5 - 5.1 mmol/L   Chloride 95 (L) 98 - 111 mmol/L   CO2 28 22 - 32 mmol/L   Glucose, Bld 257 (H) 70 - 99 mg/dL   BUN 18 8 - 23 mg/dL   Creatinine, Ser 7.82 0.61 - 1.24 mg/dL   Calcium 9.2 8.9 - 95.6 mg/dL   Total Protein 6.4 (L) 6.5 - 8.1 g/dL   Albumin 1.8 (L) 3.5 - 5.0 g/dL   AST 19 15 - 41 U/L   ALT 78 (H) 0 - 44 U/L   Alkaline Phosphatase 161 (H) 38 - 126 U/L   Total Bilirubin 0.5 0.3 - 1.2 mg/dL   GFR calc non Af Amer >60 >60 mL/min   GFR calc Af Amer >60 >60 mL/min   Anion gap 9 5 - 15  Ct Chest Wo Contrast  Result Date: 02/01/2019 CLINICAL DATA:  63 year old male with respiratory failure, unresolved left upper lobe opacity since 01/09/2019. Negative for COVID-19 X3 over the course of this month. EXAM: CT CHEST WITHOUT CONTRAST TECHNIQUE: Multidetector CT imaging of the chest was performed following the standard protocol without IV contrast. COMPARISON:  Portable chest earlier today, and earlier. Chest CT 06/03/2011. FINDINGS: Cardiovascular: No cardiomegaly or pericardial effusion. Calcified coronary  artery atherosclerosis and/or stents. Lesser Calcified aortic atherosclerosis. Vascular patency is not evaluated in the absence of IV contrast. Mediastinum/Nodes: Reactive appearing prevascular lymph nodes in the superior mediastinum are individually up to 8 millimeter short axis. Other mediastinal nodes are stable since 2012 and within normal limits. Hilar lymph nodes not well evaluated in the absence of contrast. Lungs/Pleura: Moderate to severe chronic centrilobular emphysema. Major airways are patent. Respiratory motion artifact most pronounced in the right lower lung. No acute right lung opacity other than perhaps mildly increased peribronchial thickening since 2012. Confluent abnormal in the anterior and posterior left upper lobe opacity in the form of consolidation and nodular septal thickening amidst severe emphysema. Possible cavitation demonstrated on series 4, image 37. Consolidation continues into the superior segment of the left lower lobe and also the medial basal segment of the left lower lobe with air bronchograms. No definite cavitation in the left lower lobe. Central left airways remain patent. Superimposed mild reticulonodular distal peribronchial opacity in the lingula. No pleural effusion. Upper Abdomen: Negative visible noncontrast liver, spleen, pancreas, adrenal glands, kidneys, and bowel in the upper abdomen. Musculoskeletal: Osteopenia. No acute osseous abnormality identified. IMPRESSION: 1. Severe Emphysema (ICD10-J43.9) with superimposed Multilobar Left Lung Pneumonia and suspicion of cavitation/necrosis in the left upper lobe. Consolidated superior and medial basal segments of the left lower lobe. Possible early infection in the lingula. No pleural effusion. 2. Reactive appearing superior mediastinal lymph nodes. 3. No acute findings in the right lung. 4. Calcified coronary artery atherosclerosis. Electronically Signed   By: Odessa Fleming M.D.   On: 02/01/2019 23:29   Ct Angio Chest Pe W  And/or Wo Contrast  Result Date: 02/05/2019 CLINICAL DATA:  Recent discharge from [facility] on 02/01/2019 now with recurrent severe shortness of breath and mid chest pain. EXAM: CT ANGIOGRAPHY CHEST WITH CONTRAST TECHNIQUE: Multidetector CT imaging of the chest was performed using the standard protocol during bolus administration of intravenous contrast. Multiplanar CT image reconstructions and MIPs were obtained to evaluate the vascular anatomy. CONTRAST:  OMNIPAQUE IOHEXOL 350 MG/ML SOLN COMPARISON:  02/01/2019 FINDINGS: Vascular Findings: There is adequate opacification of the pulmonary arterial system with the main pulmonary artery measuring 598 Hounsfield units. Mixed short-segment occlusive though predominantly nonocclusive pulmonary emboli are seen at the peripheral aspect of the left lower lobe pulmonary artery (image 54, series 5), extending to involve several adjacent segmental and subsegmental pulmonary arteries (coronal images 89 through 96, series 8). There is a very minimal amount of nonocclusive pulmonary embolism seen at the bifurcation of the right interlobar pulmonary artery (image 64, series 5) as well as the bifurcation of the right middle lobar pulmonary artery (image 70, series 5). Overall bilateral pulmonary embolism clot burden is deemed small in volume without definitive enlargement of the caliber the main pulmonary artery or definitive interventricular septal bowing. Normal heart size. Coronary artery calcifications. No pericardial effusion. Atherosclerotic plaque within a normal caliber thoracic aorta. No definite evidence of thoracic aortic dissection or periaortic stranding on this nongated examination. Bovine configuration of the aortic arch. The  branch vessels of the aortic arch appear patent throughout their imaged courses. Review of the MIP images confirms the above findings. ---------------------------------------------------------------------------------- Nonvascular  Findings: Mediastinum/Lymph Nodes: Mediastinal and left hilar lymphadenopathy with index subcarinal nodal conglomeration measuring 1.3 cm in greatest short axis diameter (image 54, series 5 and index left hilar lymph node measuring 1.1 cm (image 52, series 5). Prominent prevascular lymph node measures 0.9 cm in greatest short axis diameter (image 42). No axillary lymphadenopathy. Lungs/Pleura: Redemonstrated severe atypical predominant mixed centrilobular and paraseptal emphysematous change, most severely affecting the left lung apex. Additionally, there is age-indeterminate consolidative masslike opacities involving the posteromedial aspect of both the left upper (representative image 49, series 7 and lower (image 101, series 7) lobes with associated air bronchograms. The right lung remains well aerated however note is made of short-segment occlusive debris within several basilar segmental bronchi (images 115 and 122, series 7), new compared to the 01/2019 examination. No pleural effusion or pneumothorax. Upper abdomen: Limited early arterial phase evaluation of the upper abdomen is unremarkable. Musculoskeletal: No acute or aggressive osseous abnormalities. Degenerative change of the lower cervical spine is suspected though incompletely evaluated. Regional soft tissues appear normal. Normal appearance of the thyroid gland. IMPRESSION: 1. Examination is positive for small volume bilateral pulmonary embolism, left greater than right, without CT evidence of right-sided heart strain. 2. Severe apical predominant emphysematous change, most severely affecting the left lung apex. Emphysema (ICD10-J43.9). 3. Redemonstrated consolidative masslike left upper and lower lobe airspace opacities with associated air bronchograms, potentially infectious in etiology though underlying discrete lesions are not excluded on the basis of this examination. Follow-up chest CT in 4-6 weeks after treatment is recommended to ensure  resolution. 4. Mediastinal and left hilar lymphadenopathy, again, potentially reactive in etiology. Attention on recommended follow-up is advised. 5. Occlusive debris within right lower lobe segmental and subsegmental bronchi without associated airspace opacity, new compared to the 02/01/2019 examination, nonspecific though could be seen in the setting of aspiration. Clinical correlation is advised. 6. Coronary artery calcifications. Aortic Atherosclerosis (ICD10-I70.0). Electronically Signed   By: Simonne Come M.D.   On: 02/05/2019 10:25   Dg Chest Portable 1 View  Result Date: 02/01/2019 CLINICAL DATA:  63 year old male with respiratory failure, negative for COVID-19. X3 over the course of this month. EXAM: PORTABLE CHEST 1 VIEW COMPARISON:  01/26/2019 and earlier. FINDINGS: Portable AP upright view at 1912 hours. Right PICC line has been removed. Continued coarse and confluent opacity in the left upper lobe, progressed compared to 01/26/2019. Superimposed pulmonary hyperinflation and evidence of emphysema. No pneumothorax or pleural effusion. Normal cardiac size and mediastinal contours. Visualized tracheal air column is within normal limits. IMPRESSION: Chronic lung disease with unresolved confluent left upper lobe opacity which is mildly progressed since 01/26/2019. This was first demonstrated on 01/09/2019, and was new from 12/09/2018 arguing against neoplasm and favoring an infectious/inflammatory etiology. No pleural effusion or new cardiopulmonary abnormality. Electronically Signed   By: Odessa Fleming M.D.   On: 02/01/2019 19:30   Portable Chest 1 View  Result Date: 01/26/2019 CLINICAL DATA:  63 year old male with respiratory failure. Negative for COVID-19. EXAM: PORTABLE CHEST 1 VIEW COMPARISON:  01/25/2019 and earlier. FINDINGS: Portable AP upright view at 0523 hours. Underlying large lung volumes. Stable cardiac size and mediastinal contours. The right lung appears clear, with evidence of upper lung  emphysema. Right PICC line has been placed and tip is at the level of the lower SVC. Patchy and nodular left upper lung opacity has mildly  regressed since yesterday. Stable left lung ventilation elsewhere. No pneumothorax or pleural effusion. IMPRESSION: 1. Right PICC line placed, tip at the lower SVC level. 2. Evidence of emphysema. Left upper lung opacity mildly regressed since yesterday. No new cardiopulmonary abnormality. Electronically Signed   By: Odessa Fleming M.D.   On: 01/26/2019 06:04   Dg Chest Portable 1 View  Result Date: 01/25/2019 CLINICAL DATA:  Acute on chronic respiratory failure. EXAM: PORTABLE CHEST 1 VIEW COMPARISON:  Single-view of the chest 01/16/2019, 01/15/2019 and 10/27/2018. FINDINGS: The lungs are emphysematous. Increased airspace disease is seen in the left upper lobe. There is basilar fibrosis, worse on the left. Heart size is normal. No pneumothorax. IMPRESSION: Increased left upper lobe airspace opacity worrisome for pneumonia superimposed on scar. Emphysema and basilar fibrosis, worse on the left. Electronically Signed   By: Drusilla Kanner M.D.   On: 01/25/2019 07:48   Dg Chest Port 1 View  Result Date: 01/16/2019 CLINICAL DATA:  63 year old male with pneumonia and acute on chronic respiratory failure with hypercapnia EXAM: PORTABLE CHEST 1 VIEW COMPARISON:  Multiple prior chest x-rays, most recently 01/15/2019 FINDINGS: Stable appearance of the chest without significant interval change over the last 24 hours. Persistent interstitial and airspace opacities in the left upper and left lower lobes. No evidence of new airspace opacity, pleural effusion, pneumothorax or pulmonary edema. Stable background hyperinflation, emphysematous changes and chronic bronchitic changes. IMPRESSION: Stable chest x-ray without significant interval change. Persistent left upper and left lower lobe airspace opacities may represent residual areas of infiltrate/pneumonia versus developing  pleuroparenchymal scarring. Electronically Signed   By: Malachy Moan M.D.   On: 01/16/2019 14:58   Dg Chest Port 1 View  Result Date: 01/15/2019 CLINICAL DATA:  Shortness of breath EXAM: PORTABLE CHEST 1 VIEW COMPARISON:  01/14/2019 FINDINGS: Cardiac shadows within normal limits. The right lung is again clear. Left lung again demonstrates persistent infiltrates in the left upper lobe and left base stable from the previous exam. No new focal abnormality is noted. IMPRESSION: Stable left-sided infiltrates when compare with the previous exam. Electronically Signed   By: Alcide Clever M.D.   On: 01/15/2019 03:12   Dg Chest Port 1 View  Result Date: 01/14/2019 CLINICAL DATA:  Dyspnea. EXAM: PORTABLE CHEST 1 VIEW COMPARISON:  01/12/2019 FINDINGS: COPD with hyperinflation. Left upper lobe infiltrate unchanged. Left lower lobe infiltrate with progressive volume loss. No significant pleural effusion. Negative for heart failure. IMPRESSION: Left upper lobe and left lower lobe infiltrates compatible with pneumonia. Progressive volume loss in the left lung base. No effusion. Electronically Signed   By: Marlan Palau M.D.   On: 01/14/2019 13:52   Dg Chest Port 1 View  Result Date: 01/12/2019 CLINICAL DATA:  Respiratory failure EXAM: PORTABLE CHEST 1 VIEW COMPARISON:  01/09/2019 FINDINGS: Heart is normal size. Diffuse airspace disease throughout the left lung, most confluent in the left upper lobe. This is unchanged. No confluent opacity on the right. Heart is normal size. IMPRESSION: Diffuse left lung airspace disease, unchanged since prior study. Electronically Signed   By: Charlett Nose M.D.   On: 01/12/2019 08:08   Dg Chest Portable 1 View  Result Date: 01/09/2019 CLINICAL DATA:  Hypoxia EXAM: PORTABLE CHEST 1 VIEW COMPARISON:  January 09, 2019 study obtained earlier in the day FINDINGS: Endotracheal tube tip is 4.0 cm above the carina. Nasogastric tube tip is in the stomach. Side port not well seen but  probably near the gastroesophageal junction. No pneumothorax evident. : There is  airspace consolidation throughout portions of the left upper and left lower lobe. There is mild right base atelectasis. The right lung is otherwise clear. Heart size and pulmonary vascularity are normal. No adenopathy. No bone lesions. IMPRESSION: Tube positions as described without pneumothorax. Extensive airspace opacity felt to represent multifocal pneumonia throughout left upper and lower lobes. Mild right base atelectasis. Heart size normal. Electronically Signed   By: Bretta BangWilliam  Woodruff III M.D.   On: 01/09/2019 18:22   Dg Chest Port 1 View  Result Date: 01/09/2019 CLINICAL DATA:  Decreased level of consciousness and shortness of breath EXAM: PORTABLE CHEST 1 VIEW COMPARISON:  12/09/2018 FINDINGS: Cardiac shadows within normal limits. Lungs are hyperinflated similar to that seen on the prior exam. Patchy infiltrate is noted in the left upper lobe and to a lesser degree in the left lower lobe consistent with multifocal pneumonia. No sizable effusion is seen. No bony abnormality is noted. IMPRESSION: Patchy multifocal pneumonia within the left lung as described. COPD. Electronically Signed   By: Alcide CleverMark  Lukens M.D.   On: 01/09/2019 12:46   [place] Ekg Site Rite  Result Date: 01/25/2019 If Site Rite image not attached, placement could not be confirmed due to current cardiac rhythm.   EKG EKG Interpretation  Date/Time:  Wednesday Feb 07 2019 19:43:57 EDT Ventricular Rate:  110 PR Interval:    QRS Duration: 106 QT Interval:  332 QTC Calculation: 450 R Axis:   -122 Text Interpretation:  Sinus tachycardia Right bundle branch block Left posterior fasicular block No significant change since last tracing Confirmed by Cathren LaineSteinl, Chareese Sergent ([id number]) on 02/07/2019 7:46:06 PM   Radiology Dg Chest Port 1 View  Result Date: 02/07/2019 CLINICAL DATA:  Shortness of breath EXAM: PORTABLE CHEST 1 VIEW COMPARISON:  02/05/2019 FINDINGS: Cardiac  shadow is within normal limits. The right lung is hyperaerated consistent with COPD and stable from the previous exam. Chronic consolidative densities are noted in the left upper lobe stable from the prior exam. No new focal abnormality is noted. IMPRESSION: Stable changes in the left upper lobe when compare with the prior CT examination from 2 days previous. Electronically Signed   By: Alcide CleverMark  Lukens M.D.   On: 02/07/2019 20:32    Procedures Procedures (including critical care time)  Medications Ordered in ED Medications  albuterol (VENTOLIN HFA) 108 (90 Base) MCG/ACT inhaler 6 puff (has no administration in time range)  ipratropium (ATROVENT HFA) inhaler 2 puff (has no administration in time range)     Initial Impression / Assessment and Plan / ED Course  I have reviewed the triage vital signs and the nursing notes.  Pertinent labs & imaging results that were available during my care of the patient were reviewed by me and considered in my medical decision making (see chart for details).  Iv ns. Continuous pulse ox and monitor. o2 [place]. Stat pcxr and labs.   At home reportedly with increased o2 requirement, increased dyspnea compared to baseline of chronic dyspnea.   Albuterol mdi 6 puffs. atrovent mdi.   Solumedrol iv.   Reviewed nursing notes and prior charts for additional history.   Jeff Ray was evaluated in Emergency Department on 02/07/2019 for the symptoms described in the history of present illness. He was evaluated in the context of the global COVID-19 pandemic, which necessitated consideration that the patient might be at risk for infection with the SARS-CoV-2 virus that causes COVID-19. Institutional protocols and algorithms that pertain to the evaluation of patients at risk for COVID-19 are  in a state of rapid change based on information released by regulatory bodies including the CDC and federal and state organizations. These policies and algorithms were followed during the  patient's care in the ED.  Additional albuterol/atrovent tx.  Air exchange is improved from initial. Dyspnea persists.   Labs reviewed by me - wbc elev, c/w prior. agb appears relatively c/w prior.  covid neg.   CXR reviewed by me - no pna.   Suspect acute on chronic resp failure/end-stage copd/copd exacerbation - will admit to hospitalists.    Final Clinical Impressions(s) / ED Diagnoses   Final diagnoses:  None    ED Discharge Orders    None       Cathren Laine, MD 02/07/19 2118

## 2019-02-07 NOTE — ED Triage Notes (Signed)
Per pt with sob since this morning.  Pt denies pain.   Reported that [facility] nurse stated pt's sats 83 % on 3 L/M, [facility] had pt on 6 L/M and sats at 97%.  Reported that pt was just discharged to home on Monday. A&O per EMS

## 2019-02-08 ENCOUNTER — Inpatient Hospital Stay (HOSPITAL_COMMUNITY): Payer: Medicare HMO

## 2019-02-08 DIAGNOSIS — J441 Chronic obstructive pulmonary disease with (acute) exacerbation: Principal | ICD-10-CM

## 2019-02-08 DIAGNOSIS — I2699 Other pulmonary embolism without acute cor pulmonale: Secondary | ICD-10-CM

## 2019-02-08 DIAGNOSIS — R0609 Other forms of dyspnea: Secondary | ICD-10-CM

## 2019-02-08 DIAGNOSIS — R0789 Other chest pain: Secondary | ICD-10-CM

## 2019-02-08 DIAGNOSIS — Z515 Encounter for palliative care: Secondary | ICD-10-CM

## 2019-02-08 DIAGNOSIS — R079 Chest pain, unspecified: Secondary | ICD-10-CM

## 2019-02-08 LAB — GLUCOSE, CAPILLARY
Glucose-Capillary: 211 mg/dL — ABNORMAL HIGH (ref 70–99)
Glucose-Capillary: 233 mg/dL — ABNORMAL HIGH (ref 70–99)
Glucose-Capillary: 242 mg/dL — ABNORMAL HIGH (ref 70–99)
Glucose-Capillary: 285 mg/dL — ABNORMAL HIGH (ref 70–99)
Glucose-Capillary: 94 mg/dL (ref 70–99)

## 2019-02-08 LAB — ECHOCARDIOGRAM LIMITED
Height: 74 in
Weight: 2553.81 oz

## 2019-02-08 LAB — COMPREHENSIVE METABOLIC PANEL
ALT: 66 U/L — ABNORMAL HIGH (ref 0–44)
AST: 17 U/L (ref 15–41)
Albumin: 1.8 g/dL — ABNORMAL LOW (ref 3.5–5.0)
Alkaline Phosphatase: 150 U/L — ABNORMAL HIGH (ref 38–126)
Anion gap: 12 (ref 5–15)
BUN: 22 mg/dL (ref 8–23)
CO2: 26 mmol/L (ref 22–32)
Calcium: 9.3 mg/dL (ref 8.9–10.3)
Chloride: 96 mmol/L — ABNORMAL LOW (ref 98–111)
Creatinine, Ser: 0.82 mg/dL (ref 0.61–1.24)
GFR calc Af Amer: >60 mL/min (ref >60)
GFR calc non Af Amer: >60 mL/min (ref >60)
Glucose, Bld: 243 mg/dL — ABNORMAL HIGH (ref 70–99)
Potassium: 4.6 mmol/L (ref 3.5–5.1)
Sodium: 134 mmol/L — ABNORMAL LOW (ref 135–145)
Total Bilirubin: 0.8 mg/dL (ref 0.3–1.2)
Total Protein: 6.5 g/dL (ref 6.5–8.1)

## 2019-02-08 LAB — LIPID PANEL
Cholesterol: 140 mg/dL (ref 0–200)
HDL: 39 mg/dL — ABNORMAL LOW (ref >40)
LDL Cholesterol: 82 mg/dL (ref 0–99)
Total CHOL/HDL Ratio: 3.6 RATIO
Triglycerides: 96 mg/dL (ref <150)
VLDL: 19 mg/dL (ref 0–40)

## 2019-02-08 LAB — CBC
HCT: 33.2 % — ABNORMAL LOW (ref 39.0–52.0)
Hemoglobin: 10 g/dL — ABNORMAL LOW (ref 13.0–17.0)
MCH: 26.4 pg (ref 26.0–34.0)
MCHC: 30.1 g/dL (ref 30.0–36.0)
MCV: 87.6 fL (ref 80.0–100.0)
Platelets: 219 10*3/uL (ref 150–400)
RBC: 3.79 MIL/uL — ABNORMAL LOW (ref 4.22–5.81)
RDW: 15.1 % (ref 11.5–15.5)
WBC: 22.9 10*3/uL — ABNORMAL HIGH (ref 4.0–10.5)
nRBC: 0 % (ref 0.0–0.2)

## 2019-02-08 LAB — TROPONIN I
Troponin I: 0.11 ng/mL (ref <0.03)
Troponin I: 0.1 ng/mL (ref <0.03)
Troponin I: 0.09 ng/mL (ref <0.03)

## 2019-02-08 LAB — TSH: TSH: 0.41 u[IU]/mL (ref 0.350–4.500)

## 2019-02-08 LAB — MRSA PCR SCREENING: MRSA by PCR: NEGATIVE

## 2019-02-08 MED ORDER — ROSUVASTATIN CALCIUM 20 MG PO TABS
40.0000 mg | ORAL_TABLET | Freq: Every day | ORAL | Status: DC
  Administered 2019-02-08 – 2019-02-09 (×2): 40 mg via ORAL
  Filled 2019-02-08 (×2): qty 2

## 2019-02-08 MED ORDER — METHYLPREDNISOLONE SODIUM SUCC 125 MG IJ SOLR
60.0000 mg | Freq: Two times a day (BID) | INTRAMUSCULAR | Status: DC
  Administered 2019-02-08 – 2019-02-09 (×2): 60 mg via INTRAVENOUS
  Filled 2019-02-08 (×2): qty 2

## 2019-02-08 MED ORDER — ASPIRIN EC 325 MG PO TBEC
325.0000 mg | DELAYED_RELEASE_TABLET | Freq: Once | ORAL | Status: AC
  Administered 2019-02-08: 01:00:00 325 mg via ORAL
  Filled 2019-02-08: qty 1

## 2019-02-08 MED ORDER — ATORVASTATIN CALCIUM 40 MG PO TABS
80.0000 mg | ORAL_TABLET | Freq: Every day | ORAL | Status: DC
  Administered 2019-02-08: 01:00:00 80 mg via ORAL
  Filled 2019-02-08: qty 2

## 2019-02-08 MED ORDER — LEVALBUTEROL HCL 0.63 MG/3ML IN NEBU
0.6300 mg | INHALATION_SOLUTION | Freq: Four times a day (QID) | RESPIRATORY_TRACT | Status: DC
  Administered 2019-02-08 – 2019-02-09 (×6): 0.63 mg via RESPIRATORY_TRACT
  Filled 2019-02-08 (×6): qty 3

## 2019-02-08 MED ORDER — IPRATROPIUM BROMIDE 0.02 % IN SOLN
0.5000 mg | Freq: Four times a day (QID) | RESPIRATORY_TRACT | Status: DC
  Administered 2019-02-08 – 2019-02-09 (×6): 0.5 mg via RESPIRATORY_TRACT
  Filled 2019-02-08 (×6): qty 2.5

## 2019-02-08 MED ORDER — IOHEXOL 300 MG/ML  SOLN
100.0000 mL | Freq: Once | INTRAMUSCULAR | Status: AC | PRN
  Administered 2019-02-08: 14:00:00 100 mL via INTRAVENOUS

## 2019-02-08 MED ORDER — UMECLIDINIUM BROMIDE 62.5 MCG/INH IN AEPB
2.0000 | INHALATION_SPRAY | Freq: Every day | RESPIRATORY_TRACT | Status: DC
  Administered 2019-02-08 – 2019-02-10 (×3): 2 via RESPIRATORY_TRACT
  Filled 2019-02-08: qty 7

## 2019-02-08 MED ORDER — CARVEDILOL 3.125 MG PO TABS
3.1250 mg | ORAL_TABLET | Freq: Two times a day (BID) | ORAL | Status: DC
  Administered 2019-02-08: 3.125 mg via ORAL
  Filled 2019-02-08: qty 1

## 2019-02-08 MED ORDER — INSULIN ASPART 100 UNIT/ML ~~LOC~~ SOLN
3.0000 [IU] | Freq: Three times a day (TID) | SUBCUTANEOUS | Status: DC
  Administered 2019-02-08 – 2019-02-09 (×4): 3 [IU] via SUBCUTANEOUS

## 2019-02-08 MED ORDER — FLUTICASONE FUROATE-VILANTEROL 100-25 MCG/INH IN AEPB
2.0000 | INHALATION_SPRAY | Freq: Every day | RESPIRATORY_TRACT | Status: DC
  Administered 2019-02-08 – 2019-02-10 (×3): 2 via RESPIRATORY_TRACT
  Filled 2019-02-08: qty 28

## 2019-02-08 MED ORDER — SODIUM CHLORIDE 0.9 % IV SOLN
2.0000 g | Freq: Three times a day (TID) | INTRAVENOUS | Status: DC
  Administered 2019-02-08 – 2019-02-10 (×7): 2 g via INTRAVENOUS
  Filled 2019-02-08 (×6): qty 2

## 2019-02-08 MED ORDER — NITROGLYCERIN 2 % TD OINT
0.5000 [in_us] | TOPICAL_OINTMENT | Freq: Three times a day (TID) | TRANSDERMAL | Status: DC
  Filled 2019-02-08: qty 1

## 2019-02-08 MED ORDER — VANCOMYCIN HCL IN DEXTROSE 750-5 MG/150ML-% IV SOLN
750.0000 mg | Freq: Three times a day (TID) | INTRAVENOUS | Status: DC
  Administered 2019-02-08 (×2): 750 mg via INTRAVENOUS
  Filled 2019-02-08 (×2): qty 150

## 2019-02-08 MED ORDER — INSULIN ASPART 100 UNIT/ML ~~LOC~~ SOLN
0.0000 [IU] | Freq: Three times a day (TID) | SUBCUTANEOUS | Status: DC
  Administered 2019-02-08: 5 [IU] via SUBCUTANEOUS
  Administered 2019-02-09: 2 [IU] via SUBCUTANEOUS
  Administered 2019-02-09: 5 [IU] via SUBCUTANEOUS
  Administered 2019-02-09: 10:00:00 2 [IU] via SUBCUTANEOUS

## 2019-02-08 NOTE — Consult Note (Addendum)
Cardiology Consult    Patient ID: Jeff Ray; [id number]; 1956-08-28   Admit date: 02/07/2019 Date of Consult: 02/08/2019  Primary Care Provider: Lemmie Evens, MD Primary Cardiologist: Carlyle Dolly, MD   Patient Profile    Jeff Ray is a 63 y.o. male with past medical history of COPD, chronic hypoxic respiratory failure, HTN, HLD, Type 2 DM, and anxiety who is being seen today for the evaluation of chest pain at the request of Dr. Maudie Mercury.   History of Present Illness    Jeff Ray was last examined by Dr. Harl Bowie in 04/2016 for chest pain and given his multiple cardiac risk factors, a Dobutamine Myoview was obtained and showed attenuation artifact but no ischemic zones, overall being a low-risk study. He has not been evaluated by Cardiology since.   He has experienced recurrent admissions over the past several months for acute hypoxic respiratory failure in the setting of COPD exacerbation and PNA. Most recent was from 4/30 - 02/05/2019 for evaluation of worsening dyspnea and was found to have bilateral pulmonary emboli and started on Eliquis for anticoagulation.   He presented back to [facility] ED on 02/07/2019 for worsening dyspnea and was hypoxic with saturations at 83% on 3L [place]. Reported having an intermittent nonproductive cough but denied any fever or chills. He did report having constant left sided chest discomfort for the past 2 months which was not exacerbated with exertion.   Initial labs show WBC 23.4, Hgb 10.4, platelets 190, Na+ 132, K+ 4.6, and creatinine 0.78. AST 19, ALT 78, and Alk Phos 161. TSH 0.410. COVID-19 testing negative. Initial and cyclic troponin values have been flat at 0.11 and 0.10 thus far (has been elevated dating back to 2016). CXR showed known COPD with chronic consolidate densities along LUL. EKG shows sinus tachycardia, HR 110, with RBBB and LPFB with no acute changes.  Repeat this AM does show slight ST elevation in Lead V2.   He was started on IV  Cefepime and Vancomycin at the time of admission which being continued on Eliquis for recently diagnosed PE. Was also started on Coreg 3.149m BID and Atorvastatin 833mdaily after reports of chest discomfort (was on Crestor 4077maily as an outpatient so unclear why this was not just restarted).    Past Medical History:  Diagnosis Date  . Anxiety   . Asthma   . COPD (chronic obstructive pulmonary disease) ([facility] . Diabetes mellitus   . History of nuclear stress test 02/2016   low risk study  . Hypertension   . On home O2    2L [place]   . Pneumonia     Past Surgical History:  Procedure Laterality Date  . BACK SURGERY       Home Medications:  Prior to Admission medications   Medication Sig Start Date End Date Taking? Authorizing Provider  albuterol (PROAIR HFA) 108 (90 BASE) MCG/ACT inhaler Inhale 2 puffs into the lungs every 6 (six) hours as needed for wheezing or shortness of breath. Shortness of Breath   Yes [provider]  ALPRAZolam (XANAX) 1 MG tablet Take 0.5 tablets (0.5 mg total) by mouth 2 (two) times daily as needed for anxiety. 01/19/19  Yes JosDomenic PoliteD  apixaban (ELIQUIS) 5 MG TABS tablet Take 2 tabs po BID thru 5/10, then 1 po BID starting 5/11 Patient taking differently: Take 5-10 mg by mouth See admin instructions. Take 2 tabs by mouth twice daily thru 5/10, then 1 tab twice  daily starting 5/11 02/05/19  Yes Johnson, Clanford L, MD  fexofenadine-pseudoephedrine (ALLEGRA-D 24) 180-240 MG 24 hr tablet Take 1 tablet by mouth daily as needed (allergies). 02/05/19  Yes Johnson, Clanford L, MD  fluticasone (FLONASE) 50 MCG/ACT nasal spray Place 2 sprays into both nostrils daily. 09/30/17  Yes [provider]  Guaifenesin (MUCINEX MAXIMUM STRENGTH) 1200 MG TB12 Take 1 tablet (1,200 mg total) by mouth 2 (two) times daily. 12/11/18  Yes Emokpae, Courage, MD  Insulin Glargine (LANTUS) 100 UNIT/ML Solostar Pen Inject 15 Units into the skin at bedtime. 02/05/19  Yes  Johnson, Clanford L, MD  ipratropium-albuterol (DUONEB) 0.5-2.5 (3) MG/3ML SOLN Take 3 mLs by nebulization every 4 (four) hours as needed. For shortness of breath   Yes [provider]  metFORMIN (GLUCOPHAGE-XR) 500 MG 24 hr tablet Take 2 tablets (1,000 mg total) by mouth 2 (two) times daily. 02/01/19  Yes Johnson, Clanford L, MD  montelukast (SINGULAIR) 10 MG tablet Take 10 mg by mouth every morning.    Yes [provider]  Multiple Vitamins-Minerals (CENTRUM SILVER 50+MEN PO) Take 1 tablet by mouth daily.   Yes [provider]  oxybutynin (DITROPAN) 5 MG tablet Take 1 tablet (5 mg total) by mouth 2 (two) times daily. 01/19/19  Yes Domenic Polite, MD  pantoprazole (PROTONIX) 40 MG tablet Take 40 mg by mouth every morning.    Yes [provider]  predniSONE (DELTASONE) 20 MG tablet Take 2 tabs QAM x 5 days, then 1 tab QAM x 5 days Patient taking differently: Take 20-40 mg by mouth See admin instructions. Take 2 tabs QAM x 5 days, then 1 tab QAM x 5 days starting on 02/05/2019 02/05/19  Yes Johnson, Clanford L, MD  rosuvastatin (CRESTOR) 40 MG tablet Take 40 mg by mouth at bedtime.  07/24/17  Yes [provider]  TRELEGY ELLIPTA 100-62.5-25 MCG/INH AEPB Take 2 puffs by mouth daily. 11/17/18  Yes [provider]    Inpatient Medications: Scheduled Meds: . apixaban  10 mg Oral BID   Followed by  . [START ON 02/12/2019] apixaban  5 mg Oral BID  . chlorhexidine  15 mL Mouth Rinse BID  . feeding supplement (PRO-STAT SUGAR FREE 64)  30 mL Oral BID  . fluticasone  2 spray Each Nare Daily  . fluticasone furoate-vilanterol  2 puff Inhalation Daily   And  . umeclidinium bromide  2 puff Inhalation Daily  . guaiFENesin  1,200 mg Oral BID  . insulin aspart  0-5 Units Subcutaneous QHS  . insulin aspart  0-9 Units Subcutaneous TID WC  . insulin glargine  15 Units Subcutaneous QHS  . ipratropium  2 puff Inhalation Once  . ipratropium  2 puff Inhalation Once   . ipratropium  0.5 mg Nebulization Q6H  . levalbuterol  0.63 mg Nebulization Q6H  . loratadine  10 mg Oral Daily  . mouth rinse  15 mL Mouth Rinse q12n4p  . metFORMIN  1,000 mg Oral BID WC  . methylPREDNISolone (SOLU-MEDROL) injection  60 mg Intravenous Q12H  . montelukast  10 mg Oral q morning - 10a  . multivitamin with minerals  1 tablet Oral Daily  . oxybutynin  5 mg Oral BID  . pantoprazole  40 mg Oral q morning - 10a  . rosuvastatin  40 mg Oral QHS  . sodium chloride flush  3 mL Intravenous Q12H   Continuous Infusions: . sodium chloride    . ceFEPime (MAXIPIME) IV 2 g (02/08/19 0852)  .  vancomycin     PRN Meds: sodium chloride, acetaminophen **OR** acetaminophen, ALPRAZolam, sodium chloride flush  Allergies:   No Known Allergies  Social History:   Social History   Socioeconomic History  . Marital status: Married    Spouse name: Not on file  . Number of children: Not on file  . Years of education: Not on file  . Highest education level: Not on file  Occupational History  . Not on file  Social Needs  . Financial resource strain: Not on file  . Food insecurity:    Worry: Not on file    Inability: Not on file  . Transportation needs:    Medical: Not on file    Non-medical: Not on file  Tobacco Use  . Smoking status: Former Smoker    Packs/day: 0.00    Years: 10.00    Pack years: 0.00    Types: Cigarettes    Last attempt to quit: 10/20/2012    Years since quitting: 6.3  . Smokeless tobacco: Never Used  Substance and Sexual Activity  . Alcohol use: No    Alcohol/week: 0.0 standard drinks  . Drug use: No  . Sexual activity: Yes  Lifestyle  . Physical activity:    Days per week: Not on file    Minutes per session: Not on file  . Stress: Not on file  Relationships  . Social connections:    Talks on phone: Not on file    Gets together: Not on file    Attends religious service: Not on file    Active member of club or organization: Not on file    Attends  meetings of clubs or organizations: Not on file    Relationship status: Not on file  . Intimate partner violence:    Fear of current or ex partner: Not on file    Emotionally abused: Not on file    Physically abused: Not on file    Forced sexual activity: Not on file  Other Topics Concern  . Not on file  Social History Narrative  . Not on file     Family History:    Family History  Problem Relation Age of Onset  . Diabetes Mother       Review of Systems    General:  No chills, fever, night sweats or weight changes.  Cardiovascular:  No edema, orthopnea, palpitations, paroxysmal nocturnal dyspnea. Positive for chest pain and dyspnea on exertion.  Dermatological: No rash, lesions/masses Respiratory: Positive for cough and dyspnea. Urologic: No hematuria, dysuria Abdominal:   No nausea, vomiting, diarrhea, bright red blood per rectum, melena, or hematemesis Neurologic:  No visual changes, wkns, changes in mental status. All other systems reviewed and are otherwise negative except as noted above.  Physical Exam/Data    Vitals:   02/08/19 0540 02/08/19 0816 02/08/19 0849 02/08/19 0855  BP: 103/67   117/68  Pulse: 100   92  Resp: (!) 28     Temp: (!) 97.3 F (36.3 C)     TempSrc: Oral     SpO2: 98% 90% 97%   Weight: 72.4 kg     Height:        Intake/Output Summary (Last 24 hours) at 02/08/2019 0947 Last data filed at 02/08/2019 0540 Gross per 24 hour  Intake 629.21 ml  Output 540 ml  Net 89.21 ml   Filed Weights   02/07/19 1941 02/08/19 0201 02/08/19 0540  Weight: 72.6 kg 69.3 kg 72.4 kg   Body mass  index is 20.49 kg/m.   General: Pleasant male appearing in NAD Psych: Normal affect. Neuro: Alert and oriented X 3. Moves all extremities spontaneously. HEENT: Normal  Neck: Supple without bruits or JVD. Lungs:  Resp regular and unlabored. On 4L [place].  Prolonged expiratory phase.  Left-sided pleural friction rub noted. Heart: RRR no s3, s4, or murmurs. Abdomen:  Soft, non-tender, non-distended, BS + x 4.  Extremities: No clubbing, cyanosis or edema. DP/PT/Radials 2+ and equal bilaterally.    Labs/Studies     Relevant CV Studies:  NST: 02/2016  No diagnostic ST segment changes noted during dobutamine infusion. Rare PVC.  Small to medium sized, mild to moderate intensity, inferior defect that is fixed, most prominent at rest and suggestive of attenuation artifact, less likely scar. No large ischemic zones are noted.  This is a low risk study.  Nuclear stress EF: 61%.  Echocardiogram: 12/11/2018 IMPRESSIONS   1. The left ventricle has hyperdynamic systolic function, with an ejection fraction of >65%. The cavity size was normal. Left ventricular diastolic parameters were normal No evidence of left ventricular regional wall motion abnormalities.  2. The right ventricle has normal systolic function. The cavity was normal. There is no increase in right ventricular wall thickness.  3. The mitral valve is normal in structure.  4. The tricuspid valve is normal in structure.  5. The aortic valve is tricuspid.  6. The aortic root is normal in size and structure.  7. The inferior vena cava was dilated in size with >50% respiratory variability.  8. No evidence of left ventricular regional wall motion abnormalities.  Laboratory Data:  Chemistry Recent Labs  Lab 02/02/19 0509 02/07/19 1948 02/08/19 0527  NA 139 132* 134*  K 4.7 4.6 4.6  CL 102 95* 96*  CO2 '28 28 26  ' GLUCOSE 66* 257* 243*  BUN '21 18 22  ' CREATININE 0.82 0.78 0.82  CALCIUM 9.0 9.2 9.3  GFRNONAA >60 >60 >60  GFRAA >60 >60 >60  ANIONGAP '9 9 12    ' Recent Labs  Lab 02/07/19 1948 02/08/19 0527  PROT 6.4* 6.5  ALBUMIN 1.8* 1.8*  AST 19 17  ALT 78* 66*  ALKPHOS 161* 150*  BILITOT 0.5 0.8   Hematology Recent Labs  Lab 02/02/19 0509 02/07/19 1948 02/08/19 0527  WBC 24.3* 23.4* 22.9*  RBC 3.77* 3.85* 3.79*  HGB 10.1* 10.4* 10.0*  HCT 34.0* 33.1* 33.2*  MCV 90.2  86.0 87.6  MCH 26.8 27.0 26.4  MCHC 29.7* 31.4 30.1  RDW 15.3 15.2 15.1  PLT 175 190 219   Cardiac Enzymes Recent Labs  Lab 02/08/19 0013 02/08/19 0527  TROPONINI 0.11* 0.10*   No results for input(s): TROPIPOC in the last 168 hours.  BNPNo results for input(s): BNP, PROBNP in the last 168 hours.  DDimer No results for input(s): DDIMER in the last 168 hours.  Radiology/Studies:  Ct Angio Chest Pe W And/or Wo Contrast  Result Date: 02/05/2019 CLINICAL DATA:  Recent discharge from [facility] on 02/01/2019 now with recurrent severe shortness of breath and mid chest pain. EXAM: CT ANGIOGRAPHY CHEST WITH CONTRAST TECHNIQUE: Multidetector CT imaging of the chest was performed using the standard protocol during bolus administration of intravenous contrast. Multiplanar CT image reconstructions and MIPs were obtained to evaluate the vascular anatomy. CONTRAST:  168m OMNIPAQUE IOHEXOL 350 MG/ML SOLN COMPARISON:  02/01/2019 FINDINGS: Vascular Findings: There is adequate opacification of the pulmonary arterial system with the main pulmonary artery measuring 598 Hounsfield units. Mixed short-segment occlusive though  predominantly nonocclusive pulmonary emboli are seen at the peripheral aspect of the left lower lobe pulmonary artery (image 54, series 5), extending to involve several adjacent segmental and subsegmental pulmonary arteries (coronal images 89 through 96, series 8). There is a very minimal amount of nonocclusive pulmonary embolism seen at the bifurcation of the right interlobar pulmonary artery (image 64, series 5) as well as the bifurcation of the right middle lobar pulmonary artery (image 70, series 5). Overall bilateral pulmonary embolism clot burden is deemed small in volume without definitive enlargement of the caliber the main pulmonary artery or definitive interventricular septal bowing. Normal heart size. Coronary artery calcifications. No pericardial effusion. Atherosclerotic plaque within  a normal caliber thoracic aorta. No definite evidence of thoracic aortic dissection or periaortic stranding on this nongated examination. Bovine configuration of the aortic arch. The branch vessels of the aortic arch appear patent throughout their imaged courses. Review of the MIP images confirms the above findings. ---------------------------------------------------------------------------------- Nonvascular Findings: Mediastinum/Lymph Nodes: Mediastinal and left hilar lymphadenopathy with index subcarinal nodal conglomeration measuring 1.3 cm in greatest short axis diameter (image 54, series 5 and index left hilar lymph node measuring 1.1 cm (image 52, series 5). Prominent prevascular lymph node measures 0.9 cm in greatest short axis diameter (image 42). No axillary lymphadenopathy. Lungs/Pleura: Redemonstrated severe atypical predominant mixed centrilobular and paraseptal emphysematous change, most severely affecting the left lung apex. Additionally, there is age-indeterminate consolidative masslike opacities involving the posteromedial aspect of both the left upper (representative image 49, series 7 and lower (image 101, series 7) lobes with associated air bronchograms. The right lung remains well aerated however note is made of short-segment occlusive debris within several basilar segmental bronchi (images 115 and 122, series 7), new compared to the 01/2019 examination. No pleural effusion or pneumothorax. Upper abdomen: Limited early arterial phase evaluation of the upper abdomen is unremarkable. Musculoskeletal: No acute or aggressive osseous abnormalities. Degenerative change of the lower cervical spine is suspected though incompletely evaluated. Regional soft tissues appear normal. Normal appearance of the thyroid gland. IMPRESSION: 1. Examination is positive for small volume bilateral pulmonary embolism, left greater than right, without CT evidence of right-sided heart strain. 2. Severe apical predominant  emphysematous change, most severely affecting the left lung apex. Emphysema (ICD10-J43.9). 3. Redemonstrated consolidative masslike left upper and lower lobe airspace opacities with associated air bronchograms, potentially infectious in etiology though underlying discrete lesions are not excluded on the basis of this examination. Follow-up chest CT in 4-6 weeks after treatment is recommended to ensure resolution. 4. Mediastinal and left hilar lymphadenopathy, again, potentially reactive in etiology. Attention on recommended follow-up is advised. 5. Occlusive debris within right lower lobe segmental and subsegmental bronchi without associated airspace opacity, new compared to the 02/01/2019 examination, nonspecific though could be seen in the setting of aspiration. Clinical correlation is advised. 6. Coronary artery calcifications. Aortic Atherosclerosis (ICD10-I70.0). Electronically Signed   By: Sandi Mariscal M.D.   On: 02/05/2019 10:25   Dg Chest Port 1 View  Result Date: 02/07/2019 CLINICAL DATA:  Shortness of breath EXAM: PORTABLE CHEST 1 VIEW COMPARISON:  02/05/2019 FINDINGS: Cardiac shadow is within normal limits. The right lung is hyperaerated consistent with COPD and stable from the previous exam. Chronic consolidative densities are noted in the left upper lobe stable from the prior exam. No new focal abnormality is noted. IMPRESSION: Stable changes in the left upper lobe when compare with the prior CT examination from 2 days previous. Electronically Signed   By: Inez Catalina  M.D.   On: 02/07/2019 20:32     Assessment & Plan    1. Pleuritic Chest Pain/ Elevated Troponin Values - he reports symptoms for the past 2 months which have been constant since onset and are typically worse with deep breathing. Has also been reproducible on palpation.  - he does have multiple cardiac risk factors including HLD, Type 2 DM, an former tobacco use. Last ischemic evaluation was a Dobutamine Myoview in 2017 which  showed attenuation artifact but no ischemic zones, overall being a low-risk study. Echocardiogram in 12/2018 showed a preserved EF of > 65% with no regional WMA. Cyclic enzymes this admission have been flat at 0.11 and 0.10 (similar to prior values as they have been mildly elevated since 2016) which is most consistent with demand ischemia in the setting of his known COPD.  - a repeat echo has been ordered by the admitting team. Would change this to a limited study to reassess EF and to evaluate for right heart strain given known PE as he just had a complete echo less than 2 months ago. Not on ASA given the need for anticoagulation. Was just started on Coreg by the admitting team this morning but this is not an ideal medication for him in the setting of COPD and chronic respiratory failure. Will discontinue for now and discuss with Dr. Domenic Polite. If restarting BB, consider Bisoprolol.  Overall, would not anticipate repeat stress testing at this time given his respiratory status. Unless EF found to be reduced, would favor medical management for now.   2. HTN - BP has been well-controlled at 102/59 - 127/78 within the past 24 hours. He was not on any anti-hypertensive medications PTA. Would continue to follow.  3. HLD - FLP shows total cholesterol of 140, HDL 39, and LDL 82. LFT's (AST 19 and ALT 78) slightly elevated at time of admission and will need to follow. Will switch back from Atorvastatin to his PTA Crestor 22m daily.   4. Type 2 DM - Hgb A1c elevated to 11.1 in 01/2019. He has been continued on PTA Lantus, Metformin, and SSI by the admitting team.   5. Recent Diagnosis of Bilateral PE - CTA on 5/4 showed small volume bilateral pulmonary emboli.  - on Eliquis for anticoagulation. Anticipate limited echo as outlined above but no evidence of right heart strain by CTA.   6. Acute on Chronic Hypoxic Respiratory Failure/COPD - WBC elevated to 23.4 at admission. COVID testing negative. He has been  started on IV Cefepime and Vancomycin per the admitting team for likely PNA.    For questions or updates, please contact CNuevoPlease consult [email or web address] for contact info under Cardiology/STEMI.  Signed, BErma Heritage PA-C 02/08/2019, 9:47 AM Pager: [phone]  Attending note:  Patient seen and examined.  I reviewed his records.  I agree with above documentation by Ms. Strader PA-C.  Physical examination modified to reflect my findings.  Mr. WGrittonpresents with atypical chest pain in the setting of COPD with chronic hypoxic respiratory failure and recent diagnosis of bilateral pulmonary emboli.  He describes a sharp, lancinating left-sided discomfort in the left lower chest that is nonexertional, worse when he lays on his left side, also worse with cough.  On examination this morning he is in no distress.  Currently afebrile.  Heart rate 90-100 range in sinus rhythm/sinus tachycardia by telemetry which I personally reviewed.  Occasional PVCs noted.  Lungs exhibit decreased breath sounds with prolonged expiratory phase, also left-sided  pleural friction rub.  Cardiac exam with RRR no gallop.  Work reveals potassium 4.6, BUN 22, creatinine 0.82, AST 16, ALT 66, troponin I 0.11 and 0.10, LDL 82, WBC 22.9, hemoglobin 10.0, platelets 219, TSH 0.410, SARS coronavirus 2 negative.   I personally reviewed his recent tracing which shows sinus tachycardia with prolonged PR interval and right bundle branch block.  Follow-up chest x-ray reports stable findings in comparison to recent abnormal chest CT reported above.  Atypical chest pain with left-sided pleural friction rub inconsistent with cardiac etiology.  Most likely inflammatory in association with known COPD, possible pneumonia, and recently diagnosed bilateral pulmonary emboli.  The mild, flat elevation in troponin I is not suggestive of ACS, likely demand ischemia.  Agree with follow-up echocardiogram to assess cardiac structure  and function in light of recent bilateral pulmonary emboli. Do not anticipate any further cardiac ischemic testing however at this time  Satira Sark, M.D., F.A.C.C. .

## 2019-02-08 NOTE — Progress Notes (Signed)
   Progress Note  Patient Name: Jeff Ray Date of Encounter: 02/08/2019  Follow-up echocardiogram shows LVEF approximately 55%.  Right ventricle is moderately dilated with moderately reduced contraction.  Unable to assess RVSP.  At this point no further cardiac ischemic work-up is planned.  Do not anticipate aspirin given use of Eliquis for anticoagulation with recent diagnosis of bilateral pulmonary emboli.  Continue statin therapy, previously on Crestor with recent LDL 82.  Would hold off beta-blocker at this time unless PVCs increase in frequency.  In that case would consider bisoprolol since he has COPD.  [facility] will sign off.  Signed, Nona Dell, MD  02/08/2019, 4:09 PM

## 2019-02-08 NOTE — Progress Notes (Signed)
*  PRELIMINARY RESULTS* Echocardiogram 2D Echocardiogram LIMITED has been performed.  Jeff Ray 02/08/2019, 1:23 PM

## 2019-02-08 NOTE — Progress Notes (Signed)
PROGRESS NOTE    Jeff Ray  YTK:[id number] DOB: [birth year] DOA: 02/07/2019 PCP: Gareth Morgan, MD   Brief Narrative:  Per HPI: Jeff Ray  is a 63 y.o. male,w severe Copd on home o2, h/o PE, Dm2, Hypertension, w recent admission for acute respiratory failure/ PE, pneumonia,  apparently c/o acute dyspnea starting earlier today.  Pt tried nebulizer treatment at home without success.  Admits to cough with clear/ grey sputum.  Pt notes left sided chest pain >2 months. Unchanged. Pt states that he has been compliant with Eliquis.  Pt denies fever, chills, palp, n/v, abd pain, diarrhea, brbpr, black stool, dysuria, hematuria, wt loss.   Pt will be admitted for acute hypoxic respiratory failure on chronic respiratory failure secondary to Copd exacerbation, tachycardia, leukoctyosis, abnormal liver function, chronic chest pain.   Patient was admitted with acute on chronic hypoxemic respiratory failure secondary to COPD exacerbation along with findings of pneumonia on CT chest.  He is also noted to have recent diagnosis of pulmonary embolus and was noted to have some demand ischemia which was evaluated by cardiology with plans for limited echo at this time.  Assessment & Plan:   Principal Problem:   COPD exacerbation (HCC) Active Problems:   HTN (hypertension)   DM (diabetes mellitus) (HCC)   Tachycardia   Abnormal liver function   Severe protein-calorie malnutrition (HCC)   Weight loss, unintentional   Chest pain   Acute on Chronic hypoxic respiratory failure secondary to Copd exacerbation ddx h/o PE, pneumonia -Decrease Solu-Medrol to 60 mg twice daily-continue current breathing treatments -We will consider pulmonology evaluation should patient not show improvement -Maintain on Eliquis for PE -2D echocardiogram pending  Consolidation w air bronchogram LUL prior CT chest 02/05/19 Blood culture x2 abx as below -We will order procalcitonin Please repeat CT chest in 4-6 weeks   Abnormal liver function / Wt loss (83kg=> 73kg since March 2020) ddx Crestor Hold Crestor  Check acute hepatitis panel pending Check CT abdomen pelvis pending Check cmp in AM  H/o PE Cont Eliquis pharmacy to dose  Tachycardia ? Secondary to breathing tx, ddx h/o PE Tele Trop I q6h x3 DC Allegra D - changed albuterol to Xopenex and will consider bisoprolol as needed  Chronic chest pain (hx of coronary calcifications on CT chest) EF >55% on prior echo 12/2018, repeat limited echo pending Tele Check TSH Trop I q6h x3 Cardiology consult for chronic chest pain appreciated and appears to be atypical  Dm2 fsbs qc an qhs,  Cont Lantus 15 units Ferriday qhs Cont Metformin 1000mg  po bid ISS-we will increase as needed -Noted hyperglycemia, but steroids weaned today  Allerghic rhinitis Cont Singulair 10mg  po qday Cont Flonase NS Allegra 180mg  po qday (stop pseudoephedrine due to tachycardia)  Severe protein calorie malnutrition Start Prostat 35mL po bid  Anxiety Cont Xanax prn, would try to taper this eventually  Gerd Cont PPI   DVT prophylaxis:Eliquis Code Status: Full Family Communication: None at bedside Disposition Plan: Continue ongoing treatment for pneumonia-await MRSA nares to taper antibiotics.  Appreciate cardiology evaluation with limited echo pending.  Wean steroids as tolerated.   Consultants:   Cardiology  Procedures:   None  Antimicrobials:   Vancomycin and cefepime 5/6->   Subjective: Patient seen and evaluated today with no new acute complaints or concerns. No acute concerns or events noted overnight.  He continues to have some sharp left-sided chest pain that does not appear to be exertional in nature.  He is still struggling  with shortness of breath.  Objective: Vitals:   02/08/19 0540 02/08/19 0816 02/08/19 0849 02/08/19 0855  BP: 103/67   117/68  Pulse: 100   92  Resp: (!) 28     Temp: (!) 97.3 F (36.3 C)     TempSrc: Oral      SpO2: 98% 90% 97%   Weight: 72.4 kg     Height:        Intake/Output Summary (Last 24 hours) at 02/08/2019 1048 Last data filed at 02/08/2019 0540 Gross per 24 hour  Intake 629.21 ml  Output 540 ml  Net 89.21 ml   Filed Weights   02/07/19 1941 02/08/19 0201 02/08/19 0540  Weight: 72.6 kg 69.3 kg 72.4 kg    Examination:  General exam: Appears calm and comfortable  Respiratory system: Clear to auscultation. Respiratory effort normal.  Currently on CPAP Cardiovascular system: S1 & S2 heard, RRR. No JVD, murmurs, rubs, gallops or clicks. No pedal edema. Gastrointestinal system: Abdomen is nondistended, soft and nontender. No organomegaly or masses felt. Normal bowel sounds heard. Central nervous system: Alert and oriented. No focal neurological deficits. Extremities: Symmetric 5 x 5 power. Skin: No rashes, lesions or ulcers Psychiatry: Judgement and insight appear normal. Mood & affect appropriate.     Data Reviewed: I have personally reviewed following labs and imaging studies  CBC: Recent Labs  Lab 02/01/19 1937 02/02/19 0509 02/07/19 1948 02/08/19 0527  WBC 26.7* 24.3* 23.4* 22.9*  HGB 12.5* 10.1* 10.4* 10.0*  HCT 41.7 34.0* 33.1* 33.2*  MCV 89.7 90.2 86.0 87.6  PLT 242 175 190 219   Basic Metabolic Panel: Recent Labs  Lab 02/01/19 1937 02/02/19 0509 02/07/19 1948 02/08/19 0527  NA 139 139 132* 134*  K 4.2 4.7 4.6 4.6  CL 98 102 95* 96*  CO2 GLUCOSE 69* 66* 257* 243*  BUN CREATININE 1.01 0.82 0.78 0.82  CALCIUM 9.8 9.0 9.2 9.3   GFR: Estimated Creatinine Clearance: 93.2 mL/min (by C-G formula based on SCr of 0.82 mg/dL). Liver Function Tests: Recent Labs  Lab 02/07/19 1948 02/08/19 0527  AST 19 17  ALT 78* 66*  ALKPHOS 161* 150*  BILITOT 0.5 0.8  PROT 6.4* 6.5  ALBUMIN 1.8* 1.8*   No results for input(s): LIPASE, AMYLASE in the last 168 hours. No results for input(s): AMMONIA in the last 168 hours. Coagulation Profile:  No results for input(s): INR, PROTIME in the last 168 hours. Cardiac Enzymes: Recent Labs  Lab 02/08/19 0013 02/08/19 0527  TROPONINI 0.11* 0.10*   BNP (last 3 results) No results for input(s): PROBNP in the last 8760 hours. HbA1C: No results for input(s): HGBA1C in the last 72 hours. CBG: Recent Labs  Lab 02/05/19 0317 02/05/19 0738 02/05/19 1133 02/08/19 0020 02/08/19 0735  GLUCAP 234* 185* 219* 285* 242*   Lipid Profile: Recent Labs    02/08/19 0527  CHOL 140  HDL 39*  LDLCALC 82  TRIG 96  CHOLHDL 3.6   Thyroid Function Tests: Recent Labs    02/08/19 0013  TSH 0.410   Anemia Panel: No results for input(s): VITAMINB12, FOLATE, FERRITIN, TIBC, IRON, RETICCTPCT in the last 72 hours. Sepsis Labs: No results for input(s): PROCALCITON, LATICACIDVEN in the last 168 hours.  Recent Results (from the past 240 hour(s))  SARS Coronavirus 2 (CEPHEID - Performed in [facility] hospital lab), Hosp Order     Status: None   Collection Time: 02/07/19  7:47 PM  Result Value Ref Range Status   SARS Coronavirus 2 NEGATIVE NEGATIVE Final    Comment: (NOTE) If result is NEGATIVE SARS-CoV-2 target nucleic acids are NOT DETECTED. The SARS-CoV-2 RNA is generally detectable in upper and lower  respiratory specimens during the acute phase of infection. The lowest  concentration of SARS-CoV-2 viral copies this assay can detect is 250  copies / mL. A negative result does not preclude SARS-CoV-2 infection  and should not be used as the sole basis for treatment or other  patient management decisions.  A negative result may occur with  improper specimen collection / handling, submission of specimen other  than nasopharyngeal swab, presence of viral mutation(s) within the  areas targeted by this assay, and inadequate number of viral copies  (<250 copies / mL). A negative result must be combined with clinical  observations, patient history, and epidemiological information. If result is  POSITIVE SARS-CoV-2 target nucleic acids are DETECTED. The SARS-CoV-2 RNA is generally detectable in upper and lower  respiratory specimens dur ing the acute phase of infection.  Positive  results are indicative of active infection with SARS-CoV-2.  Clinical  correlation with patient history and other diagnostic information is  necessary to determine patient infection status.  Positive results do  not rule out bacterial infection or co-infection with other viruses. If result is PRESUMPTIVE POSTIVE SARS-CoV-2 nucleic acids MAY BE PRESENT.   A presumptive positive result was obtained on the submitted specimen  and confirmed on repeat testing.  While 2019 novel coronavirus  (SARS-CoV-2) nucleic acids may be present in the submitted sample  additional confirmatory testing may be necessary for epidemiological  and / or clinical management purposes  to differentiate between  SARS-CoV-2 and other Sarbecovirus currently known to infect humans.  If clinically indicated additional testing with an alternate test  methodology [phone]) is advised. The SARS-CoV-2 RNA is generally  detectable in upper and lower respiratory sp ecimens during the acute  phase of infection. The expected result is Negative. Fact Sheet for Patients:  BoilerBrush.com.cy Fact Sheet for Healthcare Providers: https://pope.com/ This test is not yet approved or cleared by the [place] FDA and has been authorized for detection and/or diagnosis of SARS-CoV-2 by FDA under an Emergency Use Authorization (EUA).  This EUA will remain in effect (meaning this test can be used) for the duration of the COVID-19 declaration under Section 564(b)(1) of the Act, 21 U.S.C. section 360bbb-3(b)(1), unless the authorization is terminated or revoked sooner. Performed at [facility], [address] [place], [place] [address]   Culture, blood (Routine X 2) w Reflex to ID Panel     Status:  None (Preliminary result)   Collection Time: 02/08/19 12:13 AM  Result Value Ref Range Status   Specimen Description BLOOD LEFT ANTECUBITAL  Final   Special Requests   Final    BOTTLES DRAWN AEROBIC AND ANAEROBIC Blood Culture adequate volume   Culture   Final    NO GROWTH < 12 HOURS Performed at [facility], [address], [place], [place] [address]    Report Status PENDING  Incomplete  Culture, blood (Routine X 2) w Reflex to ID Panel     Status: None (Preliminary result)   Collection Time: 02/08/19 12:19 AM  Result Value Ref Range Status   Specimen Description BLOOD RIGHT HAND  Final   Special Requests   Final    BOTTLES DRAWN AEROBIC AND ANAEROBIC Blood Culture adequate volume   Culture   Final    NO  GROWTH < 12 HOURS Performed at [facility], [address] [place], [place] [address]    Report Status PENDING  Incomplete         Radiology Studies: Dg Chest Port 1 View  Result Date: 02/07/2019 CLINICAL DATA:  Shortness of breath EXAM: PORTABLE CHEST 1 VIEW COMPARISON:  02/05/2019 FINDINGS: Cardiac shadow is within normal limits. The right lung is hyperaerated consistent with COPD and stable from the previous exam. Chronic consolidative densities are noted in the left upper lobe stable from the prior exam. No new focal abnormality is noted. IMPRESSION: Stable changes in the left upper lobe when compare with the prior CT examination from 2 days previous. Electronically Signed   By: Alcide CleverMark  Lukens M.D.   On: 02/07/2019 20:32        Scheduled Meds: . apixaban  10 mg Oral BID   Followed by  . [START ON 02/12/2019] apixaban  5 mg Oral BID  . chlorhexidine  15 mL Mouth Rinse BID  . feeding supplement (PRO-STAT SUGAR FREE 64)  30 mL Oral BID  . fluticasone  2 spray Each Nare Daily  . fluticasone furoate-vilanterol  2 puff Inhalation Daily   And  . umeclidinium bromide  2 puff Inhalation Daily  . guaiFENesin  1,200 mg Oral BID  . insulin aspart  0-5 Units Subcutaneous QHS  .  insulin aspart  0-9 Units Subcutaneous TID WC  . insulin glargine  15 Units Subcutaneous QHS  . ipratropium  2 puff Inhalation Once  . ipratropium  2 puff Inhalation Once  . ipratropium  0.5 mg Nebulization Q6H  . levalbuterol  0.63 mg Nebulization Q6H  . loratadine  10 mg Oral Daily  . mouth rinse  15 mL Mouth Rinse q12n4p  . metFORMIN  1,000 mg Oral BID WC  . methylPREDNISolone (SOLU-MEDROL) injection  60 mg Intravenous Q12H  . montelukast  10 mg Oral q morning - 10a  . multivitamin with minerals  1 tablet Oral Daily  . oxybutynin  5 mg Oral BID  . pantoprazole  40 mg Oral q morning - 10a  . rosuvastatin  40 mg Oral QHS  . sodium chloride flush  3 mL Intravenous Q12H   Continuous Infusions: . sodium chloride    . ceFEPime (MAXIPIME) IV 2 g (02/08/19 0852)  . vancomycin       LOS: 1 day    Time spent: 30 minutes    Kieana Livesay Hoover BrunetteD Berkleigh Beckles, DO [facility] Pager [phone]  If 7PM-7AM, please contact night-coverage [email or web address] Password [email or web address] 02/08/2019, 10:48 AM

## 2019-02-08 NOTE — Progress Notes (Addendum)
CRITICAL VALUE ALERT  Critical Value:  0.11 with pleuritic chest pain  Date & Time Notied:  02/08/2019 0110  Provider Notified: Pearson Grippe, md  Orders Received/Actions taken: new orders received

## 2019-02-08 NOTE — Progress Notes (Addendum)
Patient ID: Jeff Ray, male   DOB: 1956/03/11, 63 y.o.   MRN: [id number]                                                                 XCOVER                                                                                                                                                                                                            Patient Demographics:    Jeff Ray, is a 63 y.o. male, DOB - 1956-03-20, [id number]  Admit date - 02/07/2019   Admitting Physician Pearson Grippe, MD  Outpatient Primary MD for the patient is Gareth Morgan, MD  LOS - 1   CC: chest pain   Subjective:    Jeff Ray today has per RN chest pain, left sided, no radiation, sharp,  Has been going on for months. Might be worse tonight.  Troponin 0.11.  + dyspnea.  Pt denies palpitations, n/v.     Assessment  & Plan :    Principal Problem:   COPD exacerbation (HCC) Active Problems:   HTN (hypertension)   DM (diabetes mellitus) (HCC)   Tachycardia   Abnormal liver function   Severe protein-calorie malnutrition (HCC)   Weight loss, unintentional   Chest pain ddx demand ischemia, r/o ACS in a patient w significant risk factors for CAD (Dm2, Hypertension, + coronary artery calcifications on CTA chest 02/05/19) Tele Trop I q6h x3 Check lipid Repeat 12 lead EKG STAT Check cardiac echo Cont Eliquis pharmacy to dose Start Aspirin 325mg  po x1 Start Lipitor 80mg  po qhs Start ntp 1/2 inch tid  Start carvedilol 3.125mg  po bid Cardiology consult ordered in computer, appreciate input   EKG reviewed ST at 105, RAD, nl int, RBBB,  t inversion in v1-4 (no change from prior EKG)  Code Status :  FULL CODE  Family Communication  : w patient  Disposition Plan  :  home  Barriers For Discharge :   Consults  :  Cardiology consult ordered  Procedures  :      DVT Prophylaxis  :   Eliquis  Lab Results  Component Value Date   PLT 190 02/07/2019    Antibiotics  :      Anti-infectives (From  admission, onward)  Start     Dose/Rate Route Frequency Ordered Stop   02/08/19 0000  ceFEPIme (MAXIPIME) 2 g in sodium chloride 0.9 % 100 mL IVPB     2 g 200 mL/hr over 30 Minutes Intravenous  Once 02/07/19 2335 02/08/19 0025   02/08/19 0000  vancomycin (VANCOCIN) 1,500 mg in sodium chloride 0.9 % 500 mL IVPB     1,500 mg 250 mL/hr over 120 Minutes Intravenous  Once 02/07/19 2336          Objective:   Vitals:   02/07/19 2234 02/07/19 2306 02/08/19 0126 02/08/19 0201  BP:  110/78 108/72   Pulse: (!) 105 (!) 108 (!) 103 (!) 104  Resp: (!) 26 (!) 28  (!) 22  Temp:  98.3 F (36.8 C)    TempSrc:  Oral    SpO2: 92% 92%  94%  Weight:      Height:        Wt Readings from Last 3 Encounters:  02/07/19 72.6 kg  02/05/19 71.8 kg  02/01/19 73.1 kg    No intake or output data in the 24 hours ending 02/08/19 0207   Physical Exam  Awake Alert, Oriented X 3,  Heent: anicteric Neck: no jvd Heart: rrr s1, s2, slight chest tenderness with palpation Lung: + bilateral exp wheezing, no crackles Abd: soft Ext: no c/c/e    Data Review:    CBC Recent Labs  Lab 02/01/19 1937 02/02/19 0509 02/07/19 1948  WBC 26.7* 24.3* 23.4*  HGB 12.5* 10.1* 10.4*  HCT 41.7 34.0* 33.1*  PLT 242 175 190  MCV 89.7 90.2 86.0  MCH 26.9 26.8 27.0  MCHC 30.0 29.7* 31.4  RDW 15.2 15.3 15.2    Chemistries  Recent Labs  Lab 02/01/19 1937 02/02/19 0509 02/07/19 1948  NA 139 139 132*  K 4.2 4.7 4.6  CL 98 102 95*  CO2 GLUCOSE 69* 66* 257*  BUN CREATININE 1.01 0.82 0.78  CALCIUM 9.8 9.0 9.2  AST  --   --  19  ALT  --   --  78*  ALKPHOS  --   --  161*  BILITOT  --   --  0.5   ------------------------------------------------------------------------------------------------------------------ No results for input(s): CHOL, HDL, LDLCALC, TRIG, CHOLHDL, LDLDIRECT in the last 72 hours.  Lab Results  Component Value Date   HGBA1C 11.1 (H) 01/16/2019    ------------------------------------------------------------------------------------------------------------------ Recent Labs    02/08/19 0013  TSH 0.410   ------------------------------------------------------------------------------------------------------------------ No results for input(s): VITAMINB12, FOLATE, FERRITIN, TIBC, IRON, RETICCTPCT in the last 72 hours.  Coagulation profile No results for input(s): INR, PROTIME in the last 168 hours.  No results for input(s): DDIMER in the last 72 hours.  Cardiac Enzymes Recent Labs  Lab 02/08/19 0013  TROPONINI 0.11*   ------------------------------------------------------------------------------------------------------------------    Component Value Date/Time   BNP 130.0 (H) 01/29/2019 0513    Inpatient Medications  Scheduled Meds:  albuterol  2.5 mg Nebulization QID   apixaban  10 mg Oral BID   Followed by   Melene Muller ON 02/12/2019] apixaban  5 mg Oral BID   atorvastatin  80 mg Oral q1800   chlorhexidine  15 mL Mouth Rinse BID   feeding supplement (PRO-STAT SUGAR FREE 64)  30 mL Oral BID   fluticasone  2 spray Each Nare Daily   Fluticasone-Umeclidin-Vilant  2 puff Oral Daily   guaiFENesin  1,200 mg Oral BID   insulin aspart  0-5 Units Subcutaneous QHS  insulin aspart  0-9 Units Subcutaneous TID WC   insulin glargine  15 Units Subcutaneous QHS   ipratropium  2 puff Inhalation Once   ipratropium  2 puff Inhalation Once   loratadine  10 mg Oral Daily   mouth rinse  15 mL Mouth Rinse q12n4p   metFORMIN  1,000 mg Oral BID WC   methylPREDNISolone (SOLU-MEDROL) injection  80 mg Intravenous Q8H   montelukast  10 mg Oral q morning - 10a   multivitamin with minerals  1 tablet Oral Daily   nitroGLYCERIN  0.5 inch Topical Q8H   oxybutynin  5 mg Oral BID   pantoprazole  40 mg Oral q morning - 10a   sodium chloride flush  3 mL Intravenous Q12H   Continuous Infusions:  sodium chloride      vancomycin 1,500 mg (02/08/19 0059)   PRN Meds:.sodium chloride, acetaminophen **OR** acetaminophen, albuterol, ALPRAZolam, sodium chloride flush  Micro Results Recent Results (from the past 240 hour(s))  SARS Coronavirus 2 (CEPHEID - Performed [facility] lab), Hosp Order     Status: None   Collection Time: 02/07/19  7:47 PM  Result Value Ref Range Status   SARS Coronavirus 2 NEGATIVE NEGATIVE Final    Comment: (NOTE) If result is NEGATIVE SARS-CoV-2 target nucleic acids are NOT DETECTED. The SARS-CoV-2 RNA is generally detectable in upper and lower  respiratory specimens during the acute phase of infection. The lowest  concentration of SARS-CoV-2 viral copies this assay can detect is 250  copies / mL. A negative result does not preclude SARS-CoV-2 infection  and should not be used as the sole basis for treatment or other  patient management decisions.  A negative result may occur with  improper specimen collection / handling, submission of specimen other  than nasopharyngeal swab, presence of viral mutation(s) within the  areas targeted by this assay, and inadequate number of viral copies  (<250 copies / mL). A negative result must be combined with clinical  observations, patient history, and epidemiological information. If result is POSITIVE SARS-CoV-2 target nucleic acids are DETECTED. The SARS-CoV-2 RNA is generally detectable in upper and lower  respiratory specimens dur ing the acute phase of infection.  Positive  results are indicative of active infection with SARS-CoV-2.  Clinical  correlation with patient history and other diagnostic information is  necessary to determine patient infection status.  Positive results do  not rule out bacterial infection or co-infection with other viruses. If result is PRESUMPTIVE POSTIVE SARS-CoV-2 nucleic acids MAY BE PRESENT.   A presumptive positive result was obtained on the submitted specimen  and confirmed on repeat  testing.  While 2019 novel coronavirus  (SARS-CoV-2) nucleic acids may be present in the submitted sample  additional confirmatory testing may be necessary for epidemiological  and / or clinical management purposes  to differentiate between  SARS-CoV-2 and other Sarbecovirus currently known to infect humans.  If clinically indicated additional testing with an alternate test  methodology [phone]([id number]) is advised. The SARS-CoV-2 RNA is generally  detectable in upper and lower respiratory sp ecimens during the acute  phase of infection. The expected result is Negative. Fact Sheet for Patients:  BoilerBrush.com.cyhttps://www.fda.gov/media/136312/download Fact Sheet for Healthcare Providers: https://pope.com/https://www.fda.gov/media/136313/download This test is not yet approved or cleared by the [place] FDA and has been authorized for detection and/or diagnosis of SARS-CoV-2 by FDA under an Emergency Use Authorization (EUA).  This EUA will remain in effect (meaning this test can be used) for the duration of  the COVID-19 declaration under Section 564(b)(1) of the Act, 21 U.S.C. section 360bbb-3(b)(1), unless the authorization is terminated or revoked sooner. Performed at [facility], [address] [place], [place] [address]     Radiology Reports Ct Chest Wo Contrast  Result Date: 02/01/2019 CLINICAL DATA:  63 year old male with respiratory failure, unresolved left upper lobe opacity since 01/09/2019. Negative for COVID-19 X3 over the course of this month. EXAM: CT CHEST WITHOUT CONTRAST TECHNIQUE: Multidetector CT imaging of the chest was performed following the standard protocol without IV contrast. COMPARISON:  Portable chest earlier today, and earlier. Chest CT 06/03/2011. FINDINGS: Cardiovascular: No cardiomegaly or pericardial effusion. Calcified coronary artery atherosclerosis and/or stents. Lesser Calcified aortic atherosclerosis. Vascular patency is not evaluated in the absence of IV contrast. Mediastinum/Nodes:  Reactive appearing prevascular lymph nodes in the superior mediastinum are individually up to 8 millimeter short axis. Other mediastinal nodes are stable since 2012 and within normal limits. Hilar lymph nodes not well evaluated in the absence of contrast. Lungs/Pleura: Moderate to severe chronic centrilobular emphysema. Major airways are patent. Respiratory motion artifact most pronounced in the right lower lung. No acute right lung opacity other than perhaps mildly increased peribronchial thickening since 2012. Confluent abnormal in the anterior and posterior left upper lobe opacity in the form of consolidation and nodular septal thickening amidst severe emphysema. Possible cavitation demonstrated on series 4, image 37. Consolidation continues into the superior segment of the left lower lobe and also the medial basal segment of the left lower lobe with air bronchograms. No definite cavitation in the left lower lobe. Central left airways remain patent. Superimposed mild reticulonodular distal peribronchial opacity in the lingula. No pleural effusion. Upper Abdomen: Negative visible noncontrast liver, spleen, pancreas, adrenal glands, kidneys, and bowel in the upper abdomen. Musculoskeletal: Osteopenia. No acute osseous abnormality identified. IMPRESSION: 1. Severe Emphysema (ICD10-J43.9) with superimposed Multilobar Left Lung Pneumonia and suspicion of cavitation/necrosis in the left upper lobe. Consolidated superior and medial basal segments of the left lower lobe. Possible early infection in the lingula. No pleural effusion. 2. Reactive appearing superior mediastinal lymph nodes. 3. No acute findings in the right lung. 4. Calcified coronary artery atherosclerosis. Electronically Signed   By: Odessa Fleming M.D.   On: 02/01/2019 23:29   Ct Angio Chest Pe W And/or Wo Contrast  Result Date: 02/05/2019 CLINICAL DATA:  Recent discharge from [facility] on 02/01/2019 now with recurrent severe shortness of breath and mid  chest pain. EXAM: CT ANGIOGRAPHY CHEST WITH CONTRAST TECHNIQUE: Multidetector CT imaging of the chest was performed using the standard protocol during bolus administration of intravenous contrast. Multiplanar CT image reconstructions and MIPs were obtained to evaluate the vascular anatomy. CONTRAST:  OMNIPAQUE IOHEXOL 350 MG/ML SOLN COMPARISON:  02/01/2019 FINDINGS: Vascular Findings: There is adequate opacification of the pulmonary arterial system with the main pulmonary artery measuring 598 Hounsfield units. Mixed short-segment occlusive though predominantly nonocclusive pulmonary emboli are seen at the peripheral aspect of the left lower lobe pulmonary artery (image 54, series 5), extending to involve several adjacent segmental and subsegmental pulmonary arteries (coronal images 89 through 96, series 8). There is a very minimal amount of nonocclusive pulmonary embolism seen at the bifurcation of the right interlobar pulmonary artery (image 64, series 5) as well as the bifurcation of the right middle lobar pulmonary artery (image 70, series 5). Overall bilateral pulmonary embolism clot burden is deemed small in volume without definitive enlargement of the caliber the main pulmonary artery or definitive interventricular septal bowing.  Normal heart size. Coronary artery calcifications. No pericardial effusion. Atherosclerotic plaque within a normal caliber thoracic aorta. No definite evidence of thoracic aortic dissection or periaortic stranding on this nongated examination. Bovine configuration of the aortic arch. The branch vessels of the aortic arch appear patent throughout their imaged courses. Review of the MIP images confirms the above findings. ---------------------------------------------------------------------------------- Nonvascular Findings: Mediastinum/Lymph Nodes: Mediastinal and left hilar lymphadenopathy with index subcarinal nodal conglomeration measuring 1.3 cm in greatest short axis  diameter (image 54, series 5 and index left hilar lymph node measuring 1.1 cm (image 52, series 5). Prominent prevascular lymph node measures 0.9 cm in greatest short axis diameter (image 42). No axillary lymphadenopathy. Lungs/Pleura: Redemonstrated severe atypical predominant mixed centrilobular and paraseptal emphysematous change, most severely affecting the left lung apex. Additionally, there is age-indeterminate consolidative masslike opacities involving the posteromedial aspect of both the left upper (representative image 49, series 7 and lower (image 101, series 7) lobes with associated air bronchograms. The right lung remains well aerated however note is made of short-segment occlusive debris within several basilar segmental bronchi (images 115 and 122, series 7), new compared to the 01/2019 examination. No pleural effusion or pneumothorax. Upper abdomen: Limited early arterial phase evaluation of the upper abdomen is unremarkable. Musculoskeletal: No acute or aggressive osseous abnormalities. Degenerative change of the lower cervical spine is suspected though incompletely evaluated. Regional soft tissues appear normal. Normal appearance of the thyroid gland. IMPRESSION: 1. Examination is positive for small volume bilateral pulmonary embolism, left greater than right, without CT evidence of right-sided heart strain. 2. Severe apical predominant emphysematous change, most severely affecting the left lung apex. Emphysema (ICD10-J43.9). 3. Redemonstrated consolidative masslike left upper and lower lobe airspace opacities with associated air bronchograms, potentially infectious in etiology though underlying discrete lesions are not excluded on the basis of this examination. Follow-up chest CT in 4-6 weeks after treatment is recommended to ensure resolution. 4. Mediastinal and left hilar lymphadenopathy, again, potentially reactive in etiology. Attention on recommended follow-up is advised. 5. Occlusive debris  within right lower lobe segmental and subsegmental bronchi without associated airspace opacity, new compared to the 02/01/2019 examination, nonspecific though could be seen in the setting of aspiration. Clinical correlation is advised. 6. Coronary artery calcifications. Aortic Atherosclerosis (ICD10-I70.0). Electronically Signed   By: Simonne Come M.D.   On: 02/05/2019 10:25   Dg Chest Port 1 View  Result Date: 02/07/2019 CLINICAL DATA:  Shortness of breath EXAM: PORTABLE CHEST 1 VIEW COMPARISON:  02/05/2019 FINDINGS: Cardiac shadow is within normal limits. The right lung is hyperaerated consistent with COPD and stable from the previous exam. Chronic consolidative densities are noted in the left upper lobe stable from the prior exam. No new focal abnormality is noted. IMPRESSION: Stable changes in the left upper lobe when compare with the prior CT examination from 2 days previous. Electronically Signed   By: Alcide Clever M.D.   On: 02/07/2019 20:32   Dg Chest Portable 1 View  Result Date: 02/01/2019 CLINICAL DATA:  63 year old male with respiratory failure, negative for COVID-19. X3 over the course of this month. EXAM: PORTABLE CHEST 1 VIEW COMPARISON:  01/26/2019 and earlier. FINDINGS: Portable AP upright view at 1912 hours. Right PICC line has been removed. Continued coarse and confluent opacity in the left upper lobe, progressed compared to 01/26/2019. Superimposed pulmonary hyperinflation and evidence of emphysema. No pneumothorax or pleural effusion. Normal cardiac size and mediastinal contours. Visualized tracheal air column is within normal limits. IMPRESSION: Chronic lung disease with  unresolved confluent left upper lobe opacity which is mildly progressed since 01/26/2019. This was first demonstrated on 01/09/2019, and was new from 12/09/2018 arguing against neoplasm and favoring an infectious/inflammatory etiology. No pleural effusion or new cardiopulmonary abnormality. Electronically Signed   By: Odessa Fleming M.D.   On: 02/01/2019 19:30   Portable Chest 1 View  Result Date: 01/26/2019 CLINICAL DATA:  63 year old male with respiratory failure. Negative for COVID-19. EXAM: PORTABLE CHEST 1 VIEW COMPARISON:  01/25/2019 and earlier. FINDINGS: Portable AP upright view at 0523 hours. Underlying large lung volumes. Stable cardiac size and mediastinal contours. The right lung appears clear, with evidence of upper lung emphysema. Right PICC line has been placed and tip is at the level of the lower SVC. Patchy and nodular left upper lung opacity has mildly regressed since yesterday. Stable left lung ventilation elsewhere. No pneumothorax or pleural effusion. IMPRESSION: 1. Right PICC line placed, tip at the lower SVC level. 2. Evidence of emphysema. Left upper lung opacity mildly regressed since yesterday. No new cardiopulmonary abnormality. Electronically Signed   By: Odessa Fleming M.D.   On: 01/26/2019 06:04   Dg Chest Portable 1 View  Result Date: 01/25/2019 CLINICAL DATA:  Acute on chronic respiratory failure. EXAM: PORTABLE CHEST 1 VIEW COMPARISON:  Single-view of the chest 01/16/2019, 01/15/2019 and 10/27/2018. FINDINGS: The lungs are emphysematous. Increased airspace disease is seen in the left upper lobe. There is basilar fibrosis, worse on the left. Heart size is normal. No pneumothorax. IMPRESSION: Increased left upper lobe airspace opacity worrisome for pneumonia superimposed on scar. Emphysema and basilar fibrosis, worse on the left. Electronically Signed   By: Drusilla Kanner M.D.   On: 01/25/2019 07:48   Dg Chest Port 1 View  Result Date: 01/16/2019 CLINICAL DATA:  64 year old male with pneumonia and acute on chronic respiratory failure with hypercapnia EXAM: PORTABLE CHEST 1 VIEW COMPARISON:  Multiple prior chest x-rays, most recently 01/15/2019 FINDINGS: Stable appearance of the chest without significant interval change over the last 24 hours. Persistent interstitial and airspace opacities in the left  upper and left lower lobes. No evidence of new airspace opacity, pleural effusion, pneumothorax or pulmonary edema. Stable background hyperinflation, emphysematous changes and chronic bronchitic changes. IMPRESSION: Stable chest x-ray without significant interval change. Persistent left upper and left lower lobe airspace opacities may represent residual areas of infiltrate/pneumonia versus developing pleuroparenchymal scarring. Electronically Signed   By: Malachy Moan M.D.   On: 01/16/2019 14:58   Dg Chest Port 1 View  Result Date: 01/15/2019 CLINICAL DATA:  Shortness of breath EXAM: PORTABLE CHEST 1 VIEW COMPARISON:  01/14/2019 FINDINGS: Cardiac shadows within normal limits. The right lung is again clear. Left lung again demonstrates persistent infiltrates in the left upper lobe and left base stable from the previous exam. No new focal abnormality is noted. IMPRESSION: Stable left-sided infiltrates when compare with the previous exam. Electronically Signed   By: Alcide Clever M.D.   On: 01/15/2019 03:12   Dg Chest Port 1 View  Result Date: 01/14/2019 CLINICAL DATA:  Dyspnea. EXAM: PORTABLE CHEST 1 VIEW COMPARISON:  01/12/2019 FINDINGS: COPD with hyperinflation. Left upper lobe infiltrate unchanged. Left lower lobe infiltrate with progressive volume loss. No significant pleural effusion. Negative for heart failure. IMPRESSION: Left upper lobe and left lower lobe infiltrates compatible with pneumonia. Progressive volume loss in the left lung base. No effusion. Electronically Signed   By: Marlan Palau M.D.   On: 01/14/2019 13:52   Dg Chest [facility]  Result Date: 01/12/2019 CLINICAL DATA:  Respiratory failure EXAM: PORTABLE CHEST 1 VIEW COMPARISON:  01/09/2019 FINDINGS: Heart is normal size. Diffuse airspace disease throughout the left lung, most confluent in the left upper lobe. This is unchanged. No confluent opacity on the right. Heart is normal size. IMPRESSION: Diffuse left lung airspace  disease, unchanged since prior study. Electronically Signed   By: Charlett Nose M.D.   On: 01/12/2019 08:08   Dg Chest Portable 1 View  Result Date: 01/09/2019 CLINICAL DATA:  Hypoxia EXAM: PORTABLE CHEST 1 VIEW COMPARISON:  January 09, 2019 study obtained earlier in the day FINDINGS: Endotracheal tube tip is 4.0 cm above the carina. Nasogastric tube tip is in the stomach. Side port not well seen but probably near the gastroesophageal junction. No pneumothorax evident. : There is airspace consolidation throughout portions of the left upper and left lower lobe. There is mild right base atelectasis. The right lung is otherwise clear. Heart size and pulmonary vascularity are normal. No adenopathy. No bone lesions. IMPRESSION: Tube positions as described without pneumothorax. Extensive airspace opacity felt to represent multifocal pneumonia throughout left upper and lower lobes. Mild right base atelectasis. Heart size normal. Electronically Signed   By: Bretta Bang III M.D.   On: 01/09/2019 18:22   Dg Chest Port 1 View  Result Date: 01/09/2019 CLINICAL DATA:  Decreased level of consciousness and shortness of breath EXAM: PORTABLE CHEST 1 VIEW COMPARISON:  12/09/2018 FINDINGS: Cardiac shadows within normal limits. Lungs are hyperinflated similar to that seen on the prior exam. Patchy infiltrate is noted in the left upper lobe and to a lesser degree in the left lower lobe consistent with multifocal pneumonia. No sizable effusion is seen. No bony abnormality is noted. IMPRESSION: Patchy multifocal pneumonia within the left lung as described. COPD. Electronically Signed   By: Alcide Clever M.D.   On: 01/09/2019 12:46   [place] Ekg Site Rite  Result Date: 01/25/2019 If Site Rite image not attached, placement could not be confirmed due to current cardiac rhythm.   Time Spent in minutes  critical care   Pearson Grippe M.D on 02/08/2019 at 2:08 AM  Between 7pm to 7am - Pager - [phone]   After 7pm go to  [email or web address] - password [facility]  [facility] -  Office  [phone]

## 2019-02-08 NOTE — Consult Note (Signed)
Consultation Note Date: 02/08/2019   Patient Name: Jeff Ray  DOB: [birth year]  MRN: [id number]  Age / Sex: 63 y.o., male  PCP: Lemmie Evens, MD Referring Physician: Rodena Goldmann, DO  Reason for Consultation: Establishing goals of care  HPI/Patient Profile: 63 y.o. male  with past medical history of COPD with frequent admissions, recent diagnosis PE, HTN, diabetes, anxiety admitted on 02/07/2019 with dyspnea r/t COPD exacerbation and pneumonia with recent diagnosis of PE.   Clinical Assessment and Goals of Care: I met today with Mr. Mangino. He is a very pleasant gentleman with progressing COPD. He is very aware of his declining QOL and recurrent admissions as signs of worsening disease. He shares his greatest fear is leaving his children (3 sons: 2, 15, 47 yo). He tells me some of his children have been in trouble and still need him.   I attempted to elicit GOC and wishes even though it was difficult at times to keep him from distraction at times. He tells me that his wife (legally married but separated x 12 yrs) moved back in to help care for him and they remain very close. We discussed his wishes for life support/resuscitation and he laughs and tells me "I want everything until rigor mortis sets in." However, he says that his wife believes differently and does not believe in people suffering. We discussed why one may chose an alternative of comfort when disease progresses and the importance of QOL.   We discussed who would make healthcare decisions if he could not and he tells me he would want his wife, sister and sister-in-law Juanetta Beets)  to make these decisions together as these are the people he trusts most. We discussed the importance of discussing these decisions and he expresses desire to have a meeting with his family and to complete documents. I provided him a Living Will/HCPOA and he is  hopeful to discuss with his family when he returns home. All questions/concerns addressed. Emotional support provided.   Primary Decision Maker PATIENT    SUMMARY OF RECOMMENDATIONS   - Desires full aggressive care - He plans to continue discussions with his family  Code Status/Advance Care Planning:  Full code   Symptom Management:   Per primary and pulmonary.   He has been using BiPAP at home.   Palliative Prophylaxis:   Bowel Regimen and Delirium Protocol  Additional Recommendations (Limitations, Scope, Preferences):  Full Scope Treatment  Psycho-social/Spiritual:   Desire for further Chaplaincy support:yes  Additional Recommendations: Caregiving  Support/Resources and Grief/Bereavement Support  Prognosis:   Overall prognosis very poor with severe COPD and has been in hospital more than home lately. He continues to desire full aggressive care.   Discharge Planning: To Be Determined      Primary Diagnoses: Present on Admission: . COPD exacerbation ([place]) . HTN (hypertension) . Tachycardia . Abnormal liver function . Severe protein-calorie malnutrition ([place]) . Weight loss, unintentional   I have reviewed the medical record, interviewed the patient and family, and examined  the patient. The following aspects are pertinent.  Past Medical History:  Diagnosis Date  . Anxiety   . Asthma   . COPD (chronic obstructive pulmonary disease) ([place])   . Diabetes mellitus   . History of nuclear stress test 02/2016   low risk study  . Hypertension   . On home O2    2L N/C   . Pneumonia    Social History   Socioeconomic History  . Marital status: Married    Spouse name: Not on file  . Number of children: Not on file  . Years of education: Not on file  . Highest education level: Not on file  Occupational History  . Not on file  Social Needs  . Financial resource strain: Not on file  . Food insecurity:    Worry: Not on file    Inability: Not on file  .  Transportation needs:    Medical: Not on file    Non-medical: Not on file  Tobacco Use  . Smoking status: Former Smoker    Packs/day: 0.00    Years: 10.00    Pack years: 0.00    Types: Cigarettes    Last attempt to quit: 10/20/2012    Years since quitting: 6.3  . Smokeless tobacco: Never Used  Substance and Sexual Activity  . Alcohol use: No    Alcohol/week: 0.0 standard drinks  . Drug use: No  . Sexual activity: Yes  Lifestyle  . Physical activity:    Days per week: Not on file    Minutes per session: Not on file  . Stress: Not on file  Relationships  . Social connections:    Talks on phone: Not on file    Gets together: Not on file    Attends religious service: Not on file    Active member of club or organization: Not on file    Attends meetings of clubs or organizations: Not on file    Relationship status: Not on file  Other Topics Concern  . Not on file  Social History Narrative  . Not on file   Family History  Problem Relation Age of Onset  . Diabetes Mother    Scheduled Meds: . apixaban  10 mg Oral BID   Followed by  . [START ON 02/12/2019] apixaban  5 mg Oral BID  . chlorhexidine  15 mL Mouth Rinse BID  . feeding supplement (PRO-STAT SUGAR FREE 64)  30 mL Oral BID  . fluticasone  2 spray Each Nare Daily  . fluticasone furoate-vilanterol  2 puff Inhalation Daily   And  . umeclidinium bromide  2 puff Inhalation Daily  . guaiFENesin  1,200 mg Oral BID  . insulin aspart  0-15 Units Subcutaneous TID WC  . insulin aspart  0-5 Units Subcutaneous QHS  . insulin aspart  3 Units Subcutaneous TID WC  . insulin glargine  15 Units Subcutaneous QHS  . ipratropium  2 puff Inhalation Once  . ipratropium  2 puff Inhalation Once  . ipratropium  0.5 mg Nebulization Q6H  . levalbuterol  0.63 mg Nebulization Q6H  . loratadine  10 mg Oral Daily  . mouth rinse  15 mL Mouth Rinse q12n4p  . metFORMIN  1,000 mg Oral BID WC  . methylPREDNISolone (SOLU-MEDROL) injection  60 mg  Intravenous Q12H  . montelukast  10 mg Oral q morning - 10a  . multivitamin with minerals  1 tablet Oral Daily  . oxybutynin  5 mg Oral BID  . pantoprazole  40 mg Oral q morning - 10a  . rosuvastatin  40 mg Oral QHS  . sodium chloride flush  3 mL Intravenous Q12H   Continuous Infusions: . sodium chloride    . ceFEPime (MAXIPIME) IV 2 g (02/08/19 0852)  . vancomycin 750 mg (02/08/19 1200)   PRN Meds:.sodium chloride, acetaminophen **OR** acetaminophen, ALPRAZolam, sodium chloride flush No Known Allergies Review of Systems  Constitutional: Positive for activity change, appetite change and fatigue.  Respiratory: Positive for cough and shortness of breath.   Neurological: Positive for weakness.    Physical Exam Vitals signs and nursing note reviewed.  Constitutional:      Appearance: He is ill-appearing.  Cardiovascular:     Rate and Rhythm: Normal rate.  Pulmonary:     Comments: Cough and mild/mod SOB during conversation Neurological:     Mental Status: He is alert and oriented to person, place, and time.     Vital Signs: BP 117/68   Pulse 92   Temp (!) 97.3 F (36.3 C) (Oral)   Resp (!) 28   Ht '6\' 2"'  (1.88 m)   Wt 72.4 kg   SpO2 97%   BMI 20.49 kg/m  Pain Scale: 0-10   Pain Score: 0-No pain   SpO2: SpO2: 97 % O2 Device:SpO2: 97 % O2 Flow Rate: .O2 Flow Rate (L/min): 4 L/min  IO: Intake/output summary:   Intake/Output Summary (Last 24 hours) at 02/08/2019 1518 Last data filed at 02/08/2019 1503 Gross per 24 hour  Intake 876.98 ml  Output 540 ml  Net 336.98 ml    LBM:   Baseline Weight: Weight: 72.6 kg Most recent weight: Weight: 72.4 kg     Palliative Assessment/Data:     Time In: 1415 Time Out: 1525 Time Total: 70 min Greater than 50%  of this time was spent counseling and coordinating care related to the above assessment and plan.  Signed by: Vinie Sill, NP Palliative Medicine Team Pager # [phone] (M-F 8a-5p) Team Phone # [phone]  (Nights/Weekends)

## 2019-02-08 NOTE — Progress Notes (Signed)
ANTIBIOTIC CONSULT NOTE-Preliminary  Pharmacy Consult for vancomycin and cefepime Indication: pneumonia  No Known Allergies  Patient Measurements: Height: 6\' 2"  (188 cm) Weight: 160 lb (72.6 kg) IBW/kg (Calculated) : 82.2 Adjusted Body Weight:   Vital Signs: Temp: 98.3 F (36.8 C) (05/06 2306) Temp Source: Oral (05/06 2306) BP: 108/72 (05/07 0126) Pulse Rate: 104 (05/07 0201)  Labs: Recent Labs    02/07/19 1948  WBC 23.4*  HGB 10.4*  PLT 190  CREATININE 0.78    Estimated Creatinine Clearance: 95.8 mL/min (by C-G formula based on SCr of 0.78 mg/dL).  No results for input(s): VANCOTROUGH, VANCOPEAK, VANCORANDOM, GENTTROUGH, GENTPEAK, GENTRANDOM, TOBRATROUGH, TOBRAPEAK, TOBRARND, AMIKACINPEAK, AMIKACINTROU, AMIKACIN in the last 72 hours.   Microbiology: Recent Results (from the past 720 hour(s))  Blood culture (routine x 2)     Status: Abnormal   Collection Time: 01/09/19 12:06 PM  Result Value Ref Range Status   Specimen Description   Final    BLOOD RIGHT ARM UPPER Performed at [facility], [address], [place], [place] [address]    Special Requests   Final    BOTTLES DRAWN AEROBIC AND ANAEROBIC Blood Culture adequate volume Performed at [facility], [address], [place], [place] [address]    Culture  Setup Time   Final    GRAM POSITIVE RODS AEROBIC BOTTLE ONLY Gram Stain Report Called to,Read Back By and Verified With: Minasyan C. AT [facility] AT 1020A ON [id number] BY THOMPSON S.    Culture (A)  Final    DIPHTHEROIDS(CORYNEBACTERIUM SPECIES) Standardized susceptibility testing for this organism is not available. Performed at [facility], [address], [place], [place] [address]    Report Status 01/13/2019 FINAL  Final  Culture, blood (Routine X 2) w Reflex to ID Panel     Status: None   Collection Time: 01/09/19  2:15 PM  Result Value Ref Range Status   Specimen Description BLOOD RIGHT BRACHIAL  Final   Special Requests AEROBIC BOTTLE ONLY Blood  Culture adequate volume  Final   Culture   Final    NO GROWTH 5 DAYS Performed at [facility], [address], [place], [place] [address]    Report Status 01/14/2019 FINAL  Final  Respiratory Panel by PCR     Status: None   Collection Time: 01/09/19  3:58 PM  Result Value Ref Range Status   Adenovirus NOT DETECTED NOT DETECTED Final   Coronavirus 229E NOT DETECTED NOT DETECTED Final    Comment: (NOTE) The Coronavirus on the Respiratory Panel, DOES NOT test for the novel  Coronavirus (2019 nCoV)    Coronavirus HKU1 NOT DETECTED NOT DETECTED Final   Coronavirus NL63 NOT DETECTED NOT DETECTED Final   Coronavirus OC43 NOT DETECTED NOT DETECTED Final   Metapneumovirus NOT DETECTED NOT DETECTED Final   Rhinovirus / Enterovirus NOT DETECTED NOT DETECTED Final   Influenza A NOT DETECTED NOT DETECTED Final   Influenza B NOT DETECTED NOT DETECTED Final   Parainfluenza Virus 1 NOT DETECTED NOT DETECTED Final   Parainfluenza Virus 2 NOT DETECTED NOT DETECTED Final   Parainfluenza Virus 3 NOT DETECTED NOT DETECTED Final   Parainfluenza Virus 4 NOT DETECTED NOT DETECTED Final   Respiratory Syncytial Virus NOT DETECTED NOT DETECTED Final   Bordetella pertussis NOT DETECTED NOT DETECTED Final   Chlamydophila pneumoniae NOT DETECTED NOT DETECTED Final   Mycoplasma pneumoniae NOT DETECTED NOT DETECTED Final    Comment: Performed at [facility], [address] [place], [place] [address]  Novel Coronavirus, NAA (hospital order; send-out to ref lab)     Status: None   Collection Time: 01/09/19  4:19 PM  Result Value Ref Range Status   SARS-CoV-2, NAA NOT DETECTED NOT DETECTED Final    Comment: Negative (Not Detected) results do not exclude infection caused by SARS CoV 2 and should not be used as the sole basis for treatment or other patient management decisions. Optimum specimen types and timing for peak viral levels during infections caused  by SARS CoV 2 have not been determined. Collection  of multiple specimens (types and time points) from the same patient may be necessary to detect the virus. Improper specimen collection and handling, sequence variability underlying assay primers and or probes, or the presence of organisms in  quantities less than the limit of detection of the assay may lead to false negative results. Positive and negative predictive values of testing are highly dependent on prevalence. False negative results are more likely when prevalence of disease is high. (NOTE) The expected result is Negative (Not Detected). The SARS CoV 2 test is intended for the presumptive qualitative  detection of nucleic acid from SARS CoV 2 in upper and lower  respir atory specimens. Testing methodology is real time RT PCR. Test results must be correlated with clinical presentation and  evaluated in the context of other laboratory and epidemiologic data.  Test performance can be affected because the epidemiology and  clinical spectrum of infection caused by SARS CoV 2 is not fully  known. For example, the optimum types of specimens to collect and  when during the course of infection these specimens are most likely  to contain detectable viral RNA may not be known. This test has not been Food and Drug Administration (FDA) cleared or  approved and has been authorized by FDA under an Emergency Use  Authorization (EUA). The test is only authorized for the duration of  the declaration that circumstances exist justifying the authorization  of emergency use of in vitro diagnostic tests for detection and or  diagnosis of SARS CoV 2 under Section 564(b)(1) of the Act, 21 U.S.C.  section [phone] 3(b)(1), unless the authorization is terminated or   revoked sooner. [facility] is certified under the  Clinical Laboratory Improvement Amendments of 1988 (CLIA), 42 U.S.C.  section [phone], to perform high complexity tests. Performed at [facility] CLIA  [id number] [address], [place], [place] [address] Laboratory Director: Turner Daniels, MD Fact Sheet for Healthcare Providers  https://pope.com/ Fact Sheet for Patients  BoilerBrush.com.cy Performed at [facility], [address], [place], [place] [address]    Coronavirus Source NASOPHARYNGEAL  Final    Comment: Performed at [facility], [address], [place], [place] [address]  MRSA PCR Screening     Status: None   Collection Time: 01/10/19  2:11 AM  Result Value Ref Range Status   MRSA by PCR NEGATIVE NEGATIVE Final    Comment:        The GeneXpert MRSA Assay (FDA approved for NASAL specimens only), is one component of a comprehensive MRSA colonization surveillance program. It is not intended to diagnose MRSA infection nor to guide or monitor treatment for MRSA infections. Performed at [facility], [address] [place], [place] [address]   Culture, respiratory (non-expectorated)     Status: None   Collection Time: 01/10/19  8:49 AM  Result Value Ref  Range Status   Specimen Description TRACHEAL ASPIRATE  Final   Special Requests NONE  Final   Gram Stain   Final    RARE WBC PRESENT,BOTH PMN AND MONONUCLEAR NO ORGANISMS SEEN    Culture   Final    RARE Consistent with normal respiratory flora. Performed at [facility], [address], [place], [place] [address]    Report Status 01/12/2019 FINAL  Final  SARS Coronavirus 2 [facility] order, Performed in [facility] hospital lab)     Status: None   Collection Time: 01/16/19  1:10 PM  Result Value Ref Range Status   SARS Coronavirus 2 NEGATIVE NEGATIVE Final    Comment: (NOTE) If result is NEGATIVE SARS-CoV-2 target nucleic acids are NOT DETECTED. The SARS-CoV-2 RNA is generally detectable in upper and lower  respiratory specimens during the acute phase of infection. The lowest  concentration of SARS-CoV-2 viral copies this  assay can detect is 250  copies / mL. A negative result does not preclude SARS-CoV-2 infection  and should not be used as the sole basis for treatment or other  patient management decisions.  A negative result may occur with  improper specimen collection / handling, submission of specimen other  than nasopharyngeal swab, presence of viral mutation(s) within the  areas targeted by this assay, and inadequate number of viral copies  (<250 copies / mL). A negative result must be combined with clinical  observations, patient history, and epidemiological information. If result is POSITIVE SARS-CoV-2 target nucleic acids are DETECTED. The SARS-CoV-2 RNA is generally detectable in upper and lower  respiratory specimens dur ing the acute phase of infection.  Positive  results are indicative of active infection with SARS-CoV-2.  Clinical  correlation with patient history and other diagnostic information is  necessary to determine patient infection status.  Positive results do  not rule out bacterial infection or co-infection with other viruses. If result is PRESUMPTIVE POSTIVE SARS-CoV-2 nucleic acids MAY BE PRESENT.   A presumptive positive result was obtained on the submitted specimen  and confirmed on repeat testing.  While 2019 novel coronavirus  (SARS-CoV-2) nucleic acids may be present in the submitted sample  additional confirmatory testing may be necessary for epidemiological  and / or clinical management purposes  to differentiate between  SARS-CoV-2 and other Sarbecovirus currently known to infect humans.  If clinically indicated additional testing with an alternate test  methodology [phone] is advised. The SARS-CoV-2 RNA is generally  detectable in upper and lower respiratory sp ecimens during the acute  phase of infection. The expected result is Negative. Fact Sheet for Patients:  BoilerBrush.com.cy Fact Sheet for Healthcare  Providers: https://pope.com/ This test is not yet approved or cleared by the [place] FDA and has been authorized for detection and/or diagnosis of SARS-CoV-2 by FDA under an Emergency Use Authorization (EUA).  This EUA will remain in effect (meaning this test can be used) for the duration of the COVID-19 declaration under Section 564(b)(1) of the Act, 21 U.S.C. section 360bbb-3(b)(1), unless the authorization is terminated or revoked sooner. Performed at [facility], [address], [place], [place] [address]   Blood Culture (routine x 2)     Status: None   Collection Time: 01/25/19  7:58 AM  Result Value Ref Range Status   Specimen Description RIGHT ANTECUBITAL  Final   Special Requests   Final    BOTTLES DRAWN AEROBIC AND ANAEROBIC Blood Culture adequate volume   Culture   Final  NO GROWTH 5 DAYS Performed at [facility], [address] [place], [place] [address]    Report Status 01/30/2019 FINAL  Final  Blood Culture (routine x 2)     Status: None   Collection Time: 01/25/19  9:01 AM  Result Value Ref Range Status   Specimen Description RIGHT ANTECUBITAL  Final   Special Requests   Final    BOTTLES DRAWN AEROBIC AND ANAEROBIC Blood Culture adequate volume   Culture   Final    NO GROWTH 5 DAYS Performed at [facility], [address] [place], [place] [address]    Report Status 01/30/2019 FINAL  Final  SARS Coronavirus 2 [facility] order, Performed in [facility] hospital lab)     Status: None   Collection Time: 01/25/19 10:51 AM  Result Value Ref Range Status   SARS Coronavirus 2 NEGATIVE NEGATIVE Final    Comment: (NOTE) If result is NEGATIVE SARS-CoV-2 target nucleic acids are NOT DETECTED. The SARS-CoV-2 RNA is generally detectable in upper and lower  respiratory specimens during the acute phase of infection. The lowest  concentration of SARS-CoV-2 viral copies this assay can detect is 250  copies / mL. A negative result does  not preclude SARS-CoV-2 infection  and should not be used as the sole basis for treatment or other  patient management decisions.  A negative result may occur with  improper specimen collection / handling, submission of specimen other  than nasopharyngeal swab, presence of viral mutation(s) within the  areas targeted by this assay, and inadequate number of viral copies  (<250 copies / mL). A negative result must be combined with clinical  observations, patient history, and epidemiological information. If result is POSITIVE SARS-CoV-2 target nucleic acids are DETECTED. The SARS-CoV-2 RNA is generally detectable in upper and lower  respiratory specimens dur ing the acute phase of infection.  Positive  results are indicative of active infection with SARS-CoV-2.  Clinical  correlation with patient history and other diagnostic information is  necessary to determine patient infection status.  Positive results do  not rule out bacterial infection or co-infection with other viruses. If result is PRESUMPTIVE POSTIVE SARS-CoV-2 nucleic acids MAY BE PRESENT.   A presumptive positive result was obtained on the submitted specimen  and confirmed on repeat testing.  While 2019 novel coronavirus  (SARS-CoV-2) nucleic acids may be present in the submitted sample  additional confirmatory testing may be necessary for epidemiological  and / or clinical management purposes  to differentiate between  SARS-CoV-2 and other Sarbecovirus currently known to infect humans.  If clinically indicated additional testing with an alternate test  methodology [phone]) is advised. The SARS-CoV-2 RNA is generally  detectable in upper and lower respiratory sp ecimens during the acute  phase of infection. The expected result is Negative. Fact Sheet for Patients:  BoilerBrush.com.cy Fact Sheet for Healthcare Providers: https://pope.com/ This test is not yet approved or  cleared by the [place] FDA and has been authorized for detection and/or diagnosis of SARS-CoV-2 by FDA under an Emergency Use Authorization (EUA).  This EUA will remain in effect (meaning this test can be used) for the duration of the COVID-19 declaration under Section 564(b)(1) of the Act, 21 U.S.C. section 360bbb-3(b)(1), unless the authorization is terminated or revoked sooner. Performed at [facility], [address], [place], [place] [address]   Respiratory Panel by PCR     Status: None   Collection Time: 01/25/19 12:32 PM  Result Value Ref Range Status   Adenovirus  NOT DETECTED NOT DETECTED Final   Coronavirus 229E NOT DETECTED NOT DETECTED Final    Comment: (NOTE) The Coronavirus on the Respiratory Panel, DOES NOT test for the novel  Coronavirus (2019 nCoV)    Coronavirus HKU1 NOT DETECTED NOT DETECTED Final   Coronavirus NL63 NOT DETECTED NOT DETECTED Final   Coronavirus OC43 NOT DETECTED NOT DETECTED Final   Metapneumovirus NOT DETECTED NOT DETECTED Final   Rhinovirus / Enterovirus NOT DETECTED NOT DETECTED Final   Influenza A NOT DETECTED NOT DETECTED Final   Influenza B NOT DETECTED NOT DETECTED Final   Parainfluenza Virus 1 NOT DETECTED NOT DETECTED Final   Parainfluenza Virus 2 NOT DETECTED NOT DETECTED Final   Parainfluenza Virus 3 NOT DETECTED NOT DETECTED Final   Parainfluenza Virus 4 NOT DETECTED NOT DETECTED Final   Respiratory Syncytial Virus NOT DETECTED NOT DETECTED Final   Bordetella pertussis NOT DETECTED NOT DETECTED Final   Chlamydophila pneumoniae NOT DETECTED NOT DETECTED Final   Mycoplasma pneumoniae NOT DETECTED NOT DETECTED Final    Comment: Performed at [facility], [address] [place], [place] [address]  SARS Coronavirus 2 (CEPHEID - Performed in [facility] hospital lab), Hosp Order     Status: None   Collection Time: 02/07/19  7:47 PM  Result Value Ref Range Status   SARS Coronavirus 2 NEGATIVE NEGATIVE Final    Comment:  (NOTE) If result is NEGATIVE SARS-CoV-2 target nucleic acids are NOT DETECTED. The SARS-CoV-2 RNA is generally detectable in upper and lower  respiratory specimens during the acute phase of infection. The lowest  concentration of SARS-CoV-2 viral copies this assay can detect is 250  copies / mL. A negative result does not preclude SARS-CoV-2 infection  and should not be used as the sole basis for treatment or other  patient management decisions.  A negative result may occur with  improper specimen collection / handling, submission of specimen other  than nasopharyngeal swab, presence of viral mutation(s) within the  areas targeted by this assay, and inadequate number of viral copies  (<250 copies / mL). A negative result must be combined with clinical  observations, patient history, and epidemiological information. If result is POSITIVE SARS-CoV-2 target nucleic acids are DETECTED. The SARS-CoV-2 RNA is generally detectable in upper and lower  respiratory specimens dur ing the acute phase of infection.  Positive  results are indicative of active infection with SARS-CoV-2.  Clinical  correlation with patient history and other diagnostic information is  necessary to determine patient infection status.  Positive results do  not rule out bacterial infection or co-infection with other viruses. If result is PRESUMPTIVE POSTIVE SARS-CoV-2 nucleic acids MAY BE PRESENT.   A presumptive positive result was obtained on the submitted specimen  and confirmed on repeat testing.  While 2019 novel coronavirus  (SARS-CoV-2) nucleic acids may be present in the submitted sample  additional confirmatory testing may be necessary for epidemiological  and / or clinical management purposes  to differentiate between  SARS-CoV-2 and other Sarbecovirus currently known to infect humans.  If clinically indicated additional testing with an alternate test  methodology [phone]) is advised. The SARS-CoV-2 RNA is  generally  detectable in upper and lower respiratory sp ecimens during the acute  phase of infection. The expected result is Negative. Fact Sheet for Patients:  BoilerBrush.com.cyhttps://www.fda.gov/media/136312/download Fact Sheet for Healthcare Providers: https://pope.com/https://www.fda.gov/media/136313/download This test is not yet approved or cleared by the [place] FDA and has been authorized for detection and/or diagnosis of  SARS-CoV-2 by FDA under an Emergency Use Authorization (EUA).  This EUA will remain in effect (meaning this test can be used) for the duration of the COVID-19 declaration under Section 564(b)(1) of the Act, 21 U.S.C. section 360bbb-3(b)(1), unless the authorization is terminated or revoked sooner. Performed at [facility], [address] [place], [place] [address]     Medical History: Past Medical History:  Diagnosis Date  . Anxiety   . Asthma   . COPD (chronic obstructive pulmonary disease) (HCC)   . Diabetes mellitus   . History of nuclear stress test 02/2016   low risk study  . Hypertension   . On home O2    2L N/C   . Pneumonia     Medications:  Scheduled:  . albuterol  2.5 mg Nebulization QID  . apixaban  10 mg Oral BID   Followed by  . [START ON 02/12/2019] apixaban  5 mg Oral BID  . atorvastatin  80 mg Oral q1800  . carvedilol  3.125 mg Oral BID WC  . chlorhexidine  15 mL Mouth Rinse BID  . feeding supplement (PRO-STAT SUGAR FREE 64)  30 mL Oral BID  . fluticasone  2 spray Each Nare Daily  . fluticasone furoate-vilanterol  2 puff Inhalation Daily   And  . umeclidinium bromide  2 puff Inhalation Daily  . guaiFENesin  1,200 mg Oral BID  . insulin aspart  0-5 Units Subcutaneous QHS  . insulin aspart  0-9 Units Subcutaneous TID WC  . insulin glargine  15 Units Subcutaneous QHS  . ipratropium  2 puff Inhalation Once  . ipratropium  2 puff Inhalation Once  . loratadine  10 mg Oral Daily  . mouth rinse  15 mL Mouth Rinse q12n4p  . metFORMIN  1,000 mg Oral BID WC   . methylPREDNISolone (SOLU-MEDROL) injection  80 mg Intravenous Q8H  . montelukast  10 mg Oral q morning - 10a  . multivitamin with minerals  1 tablet Oral Daily  . nitroGLYCERIN  0.5 inch Topical Q8H  . oxybutynin  5 mg Oral BID  . pantoprazole  40 mg Oral q morning - 10a  . sodium chloride flush  3 mL Intravenous Q12H   Infusions:  . sodium chloride     Anti-infectives (From admission, onward)   Start     Dose/Rate Route Frequency Ordered Stop   02/08/19 0000  ceFEPIme (MAXIPIME) 2 g in sodium chloride 0.9 % 100 mL IVPB     2 g 200 mL/hr over 30 Minutes Intravenous  Once 02/07/19 2335 02/08/19 0034   02/08/19 0000  vancomycin (VANCOCIN) 1,500 mg in sodium chloride 0.9 % 500 mL IVPB     1,500 mg 250 mL/hr over 120 Minutes Intravenous  Once 02/07/19 2336 02/08/19 0259      Assessment: 63 yo male with severe COPD, h/o  Recent PE admitted for acute respiratory failure secondary to COPD exacerbation.  Pharmacy has been asked to dose cefepime and vancomycin for pneumonia.   Goal of Therapy:  Vancomycin trough level 15-20 mcg/ml  Plan:  Preliminary review of pertinent patient information completed.  Protocol will be initiated with dose(s) of cefepime 2 grams x 1 and vancomycin  IV x 1.  Jeani Hawking clinical pharmacist will complete review during morning rounds to assess patient and finalize treatment regimen if needed.  Dalphine Cowie Scarlett, RPH 02/08/2019,3:36 AM

## 2019-02-08 NOTE — Progress Notes (Signed)
Pharmacy Antibiotic Note  Jeff Ray is a 63 y.o. male admitted on 02/07/2019 with pneumonia.  Pharmacy has been consulted for cefepime and vancomycin dosing.  Plan: Vancomycin 750mg   IV every 8 hours.  Goal trough 15-20 mcg/mL. cefepime 2gm iv q8h  Height: 6\' 2"  (188 cm) Weight: 159 lb 9.8 oz (72.4 kg) IBW/kg (Calculated) : 82.2  Temp (24hrs), Avg:97.8 F (36.6 C), Min:97.3 F (36.3 C), Max:98.3 F (36.8 C)  Recent Labs  Lab 02/01/19 1937 02/02/19 0509 02/07/19 1948 02/08/19 0527  WBC 26.7* 24.3* 23.4* 22.9*  CREATININE 1.01 0.82 0.78 0.82    Estimated Creatinine Clearance: 93.2 mL/min (by C-G formula based on SCr of 0.82 mg/dL).    No Known Allergies  Antimicrobials this admission: 5/7 cefepime >>  5/7 vancomycin >>    Microbiology results: 5/7 BCx: ngtd 5/7 MRSA PCR: ordered  5/7 Covid 19 Negative   Thank you for allowing pharmacy to be a part of this patient's care.  Gerre Pebbles Jeanetta Alonzo 02/08/2019 7:13 AM

## 2019-02-08 NOTE — Progress Notes (Addendum)
ANTICOAGULATION CONSULT NOTE - Preliminary  Pharmacy Consult for apixaban Indication: pulmonary embolus  No Known Allergies  Patient Measurements: Height: 6\' 2"  (188 cm) Weight: 160 lb (72.6 kg) IBW/kg (Calculated) : 82.2 HEPARIN DW (KG): 72.6   Vital Signs: Temp: 98.3 F (36.8 C) (05/06 2306) Temp Source: Oral (05/06 2306) BP: 108/72 (05/07 0126) Pulse Rate: 104 (05/07 0201)  Labs: Recent Labs    02/07/19 1948 02/08/19 0013  HGB 10.4*  --   HCT 33.1*  --   PLT 190  --   CREATININE 0.78  --   TROPONINI  --  0.11*   Estimated Creatinine Clearance: 95.8 mL/min (by C-G formula based on SCr of 0.78 mg/dL).  Medical History: Past Medical History:  Diagnosis Date  . Anxiety   . Asthma   . COPD (chronic obstructive pulmonary disease) (HCC)   . Diabetes mellitus   . History of nuclear stress test 02/2016   low risk study  . Hypertension   . On home O2    2L N/C   . Pneumonia     Medications:  Medications Prior to Admission  Medication Sig Dispense Refill Last Dose  . albuterol (PROAIR HFA) 108 (90 BASE) MCG/ACT inhaler Inhale 2 puffs into the lungs every 6 (six) hours as needed for wheezing or shortness of breath. Shortness of Breath   02/07/2019 at Unknown time  . ALPRAZolam (XANAX) 1 MG tablet Take 0.5 tablets (0.5 mg total) by mouth 2 (two) times daily as needed for anxiety.  0 02/07/2019 at Unknown time  . apixaban (ELIQUIS) 5 MG TABS tablet Take 2 tabs po BID thru 5/10, then 1 po BID starting 5/11 (Patient taking differently: Take 5-10 mg by mouth See admin instructions. Take 2 tabs by mouth twice daily thru 5/10, then 1 tab twice daily starting 5/11) 60 tablet 0 02/07/2019 at Unknown time  . fexofenadine-pseudoephedrine (ALLEGRA-D 24) 180-240 MG 24 hr tablet Take 1 tablet by mouth daily as needed (allergies).   02/07/2019 at Unknown time  . fluticasone (FLONASE) 50 MCG/ACT nasal spray Place 2 sprays into both nostrils daily.   02/07/2019 at Unknown time  . Guaifenesin  (MUCINEX MAXIMUM STRENGTH) 1200 MG TB12 Take 1 tablet (1,200 mg total) by mouth 2 (two) times daily. 20 each 0 02/07/2019 at Unknown time  . Insulin Glargine (LANTUS) 100 UNIT/ML Solostar Pen Inject 15 Units into the skin at bedtime. 15 mL 2 02/06/2019 at Unknown time  . ipratropium-albuterol (DUONEB) 0.5-2.5 (3) MG/3ML SOLN Take 3 mLs by nebulization every 4 (four) hours as needed. For shortness of breath   02/07/2019 at Unknown time  . metFORMIN (GLUCOPHAGE-XR) 500 MG 24 hr tablet Take 2 tablets (1,000 mg total) by mouth 2 (two) times daily.   02/07/2019 at Unknown time  . montelukast (SINGULAIR) 10 MG tablet Take 10 mg by mouth every morning.    02/07/2019 at Unknown time  . Multiple Vitamins-Minerals (CENTRUM SILVER 50+MEN PO) Take 1 tablet by mouth daily.   02/07/2019 at Unknown time  . oxybutynin (DITROPAN) 5 MG tablet Take 1 tablet (5 mg total) by mouth 2 (two) times daily.   02/07/2019 at Unknown time  . pantoprazole (PROTONIX) 40 MG tablet Take 40 mg by mouth every morning.    02/07/2019 at Unknown time  . predniSONE (DELTASONE) 20 MG tablet Take 2 tabs QAM x 5 days, then 1 tab QAM x 5 days (Patient taking differently: Take 20-40 mg by mouth See admin instructions. Take 2 tabs QAM x 5  days, then 1 tab QAM x 5 days starting on 02/05/2019) 15 tablet 0 02/07/2019 at Unknown time  . rosuvastatin (CRESTOR) 40 MG tablet Take 40 mg by mouth at bedtime.    02/06/2019 at Unknown time  . TRELEGY ELLIPTA 100-62.5-25 MCG/INH AEPB Take 2 puffs by mouth daily.   02/07/2019 at Unknown time    Assessment: Pharmacy has been asked to dose patient's apixaban for PE.  Patient had recent PE and was started on apixaban 10mg  BID x 7 days followed by 5mg  BID.  Will continue on this regimen.  Patient will receive 10mg  BID through 5/10 then decreased dose to 5mg  BID.    Plan:  Apixaban 10mg  BID through 5/10, then 5mg  BID.  Preliminary review of pertinent patient information completed.  Jeani HawkingAnnie Penn clinical pharmacist will complete review  during morning rounds to assess the patient and finalize treatment regimen.  Shailyn Weyandt Scarlett, RPH 02/08/2019,3:39 AM

## 2019-02-08 NOTE — TOC Initial Note (Addendum)
Transition of Care [facility]) - Initial/Assessment Note    Patient Details  Name: Jeff Ray MRN: [id number] Date of Birth: 1956-03-24  [facility]) CM/SW Contact:    Charonda Hefter, Chrystine Oiler, RN Phone Number: 02/08/2019, 1:16 PM  Clinical Narrative:    63 y.o. male,w severe Copd on home o2, h/o PE, Dm2, Hypertension, w recent admission for acute respiratory failure/ PE, pneumonia.  Will known to [name] team at [facility]. Recently discharged with NIV.   Readmitted with COPD exacerbation, pneumonia.  Reports compliance with medications, oxygen and NIV.  Active with home health for RN and PT, sent here by [facility] for low saturations on his usual 3 liters of oxygen.  Anticipate DC home with continuation of home health resumption. Lives with spouse and children.   Noted potential pulmonology consult pending patient improvement. ? Need for palliative consult to discuss GOC.   Discussed with attending, will get palliative involved this visit to start GOC discussions.    Expected Discharge Plan: Home w Home Health Services Barriers to Discharge: No Barriers Identified   Patient Goals and CMS Choice        Expected Discharge Plan and Services Expected Discharge Plan: Home w Home Health Services   Discharge Planning Services: CM Consult, Follow-up appt scheduled Post Acute Care Choice: Home Health, Resumption of Svcs/PTA Provider                                        Prior Living Arrangements/Services   Lives with:: Minor Children Patient language and need for interpreter reviewed:: Yes        Need for Family Participation in Patient Care: Yes (Comment) Care giver support system in place?: Yes (comment) Current home services: Home RN, Home PT Criminal Activity/Legal Involvement Pertinent to Current Situation/Hospitalization: No - Comment as needed  Activities of Daily Living Home Assistive Devices/Equipment: Eyeglasses, Oxygen ADL Screening (condition at time  of admission) Patient's cognitive ability adequate to safely complete daily activities?: Yes Is the patient deaf or have difficulty hearing?: No Does the patient have difficulty seeing, even when wearing glasses/contacts?: No Does the patient have difficulty concentrating, remembering, or making decisions?: No Patient able to express need for assistance with ADLs?: Yes Does the patient have difficulty dressing or bathing?: No Independently performs ADLs?: Yes (appropriate for developmental age) Does the patient have difficulty walking or climbing stairs?: Yes Weakness of Legs: Both Weakness of Arms/Hands: None  Permission Sought/Granted                  Emotional Assessment       Orientation: : Oriented to Self, Oriented to Place, Oriented to  Time Alcohol / Substance Use: Not Applicable Psych Involvement: No (comment)  Admission diagnosis:  COPD exacerbation (HCC) [J44.1] Acute on chronic respiratory failure with hypoxia (HCC) [J96.21] Patient Active Problem List   Diagnosis Date Noted  . Chest pain 02/08/2019  . Tachycardia 02/07/2019  . Abnormal liver function 02/07/2019  . Severe protein-calorie malnutrition (HCC) 02/07/2019  . Weight loss, unintentional 02/07/2019  . Bilateral pulmonary embolism (HCC) 02/05/2019  . Acute on chronic respiratory failure (HCC) 02/01/2019  . Acute and chronic respiratory failure (acute-on-chronic) (HCC) 01/25/2019  . HCAP (healthcare-associated pneumonia) 01/25/2019  . On home O2 01/25/2019  . Elevated lactic acid level 01/25/2019  . Pneumonia 01/09/2019  . Acute metabolic encephalopathy 01/09/2019  . Diabetic ketoacidosis with coma associated  with type 2 diabetes mellitus (HCC)   . Acute respiratory failure with hypoxia (HCC) 10/28/2018  . COPD with acute exacerbation (HCC) 09/09/2018  . Chronic respiratory failure with hypoxia (HCC) 09/08/2018  . Uncontrolled type 2 diabetes mellitus with hyperglycemia (HCC) 09/08/2018  . Elevated  troponin 09/08/2018  . SIRS (systemic inflammatory response syndrome) (HCC) 09/07/2018  . Asthma 09/07/2018  . HLD (hyperlipidemia) 09/07/2018  . GERD (gastroesophageal reflux disease) 09/07/2018  . Anxiety 09/07/2018  . BPH (benign prostatic hyperplasia) 09/07/2018  . Anemia 08/03/2015  . Acute on chronic respiratory failure with hypercapnia (HCC) 05/26/2013  . COPD exacerbation (HCC) 05/26/2013  . HTN (hypertension) 05/26/2013  . DM (diabetes mellitus) (HCC) 05/26/2013  . Tobacco abuse 05/26/2013   PCP:  [name], [name], MD Pharmacy:   [facility] [place] [place] - [address] [place] [address] Phone: [phone] Fax: [phone]  [facility] [place] [address] [place] [address] Phone: [phone] Fax: [phone]     Social Determinants of Health (SDOH) Interventions    Readmission Risk Interventions Readmission Risk Prevention Plan 02/08/2019 02/05/2019 01/26/2019  Transportation Screening Complete Complete Complete  HRI or Home Care Consult - - Complete  Social Work Consult for Recovery Care Planning/Counseling - - Complete  SW consult not completed comments - - -  Palliative Care Screening - - Not Applicable  Palliative Care Screening Not Complete Comments - - -  Medication Review (RN Care Manager) Complete Complete -  PCP or Specialist appointment within 3-5 days of discharge - Complete -  HRI or Home Care Consult Complete Complete -  SW Recovery Care/Counseling Consult Complete Complete -  Palliative Care Screening Not Applicable Not Applicable -  Skilled Nursing Facility Not Applicable Not Applicable -  Some recent data might be hidden

## 2019-02-09 DIAGNOSIS — Z7189 Other specified counseling: Secondary | ICD-10-CM

## 2019-02-09 DIAGNOSIS — Z515 Encounter for palliative care: Secondary | ICD-10-CM

## 2019-02-09 LAB — COMPREHENSIVE METABOLIC PANEL
ALT: 47 U/L — ABNORMAL HIGH (ref 0–44)
AST: 14 U/L — ABNORMAL LOW (ref 15–41)
Albumin: 1.7 g/dL — ABNORMAL LOW (ref 3.5–5.0)
Alkaline Phosphatase: 129 U/L — ABNORMAL HIGH (ref 38–126)
Anion gap: 8 (ref 5–15)
BUN: 26 mg/dL — ABNORMAL HIGH (ref 8–23)
CO2: 27 mmol/L (ref 22–32)
Calcium: 9 mg/dL (ref 8.9–10.3)
Chloride: 97 mmol/L — ABNORMAL LOW (ref 98–111)
Creatinine, Ser: 0.78 mg/dL (ref 0.61–1.24)
GFR calc Af Amer: >60 mL/min (ref >60)
GFR calc non Af Amer: >60 mL/min (ref >60)
Glucose, Bld: 175 mg/dL — ABNORMAL HIGH (ref 70–99)
Potassium: 3.9 mmol/L (ref 3.5–5.1)
Sodium: 132 mmol/L — ABNORMAL LOW (ref 135–145)
Total Bilirubin: 0.4 mg/dL (ref 0.3–1.2)
Total Protein: 5.9 g/dL — ABNORMAL LOW (ref 6.5–8.1)

## 2019-02-09 LAB — CBC
HCT: 29.6 % — ABNORMAL LOW (ref 39.0–52.0)
Hemoglobin: 8.8 g/dL — ABNORMAL LOW (ref 13.0–17.0)
MCH: 26.1 pg (ref 26.0–34.0)
MCHC: 29.7 g/dL — ABNORMAL LOW (ref 30.0–36.0)
MCV: 87.8 fL (ref 80.0–100.0)
Platelets: 227 10*3/uL (ref 150–400)
RBC: 3.37 MIL/uL — ABNORMAL LOW (ref 4.22–5.81)
RDW: 15 % (ref 11.5–15.5)
WBC: 21.3 10*3/uL — ABNORMAL HIGH (ref 4.0–10.5)
nRBC: 0.1 % (ref 0.0–0.2)

## 2019-02-09 LAB — HEPATITIS PANEL, ACUTE
HCV Ab: 0.1 s/co ratio (ref 0.0–0.9)
Hep A IgM: NEGATIVE
Hep B C IgM: NEGATIVE
Hepatitis B Surface Ag: NEGATIVE

## 2019-02-09 LAB — GLUCOSE, CAPILLARY
Glucose-Capillary: 138 mg/dL — ABNORMAL HIGH (ref 70–99)
Glucose-Capillary: 228 mg/dL — ABNORMAL HIGH (ref 70–99)
Glucose-Capillary: 131 mg/dL — ABNORMAL HIGH (ref 70–99)
Glucose-Capillary: 101 mg/dL — ABNORMAL HIGH (ref 70–99)

## 2019-02-09 LAB — PROCALCITONIN: Procalcitonin: 0.2 ng/mL

## 2019-02-09 LAB — HIV ANTIBODY (ROUTINE TESTING W REFLEX): HIV Screen 4th Generation wRfx: NONREACTIVE

## 2019-02-09 MED ORDER — BUDESONIDE 0.25 MG/2ML IN SUSP
0.2500 mg | Freq: Two times a day (BID) | RESPIRATORY_TRACT | Status: DC
  Administered 2019-02-09 – 2019-02-10 (×2): 0.25 mg via RESPIRATORY_TRACT
  Filled 2019-02-09 (×2): qty 2

## 2019-02-09 MED ORDER — METHYLPREDNISOLONE SODIUM SUCC 40 MG IJ SOLR
40.0000 mg | Freq: Two times a day (BID) | INTRAMUSCULAR | Status: DC
  Administered 2019-02-09 – 2019-02-10 (×2): 40 mg via INTRAVENOUS
  Filled 2019-02-09 (×2): qty 1

## 2019-02-09 MED ORDER — GLUCERNA SHAKE PO LIQD
237.0000 mL | Freq: Three times a day (TID) | ORAL | Status: DC
  Administered 2019-02-09 – 2019-02-10 (×4): 237 mL via ORAL

## 2019-02-09 NOTE — TOC Transition Note (Signed)
Transition of Care (TOC) - CM/SW Discharge Note Patient will discharge home in next 24/48 hours.  Orders placed to resume Home Health. Bonita Quin with [facility] notified.   Patient Details  Name: Jeff Ray MRN: [id number] Date of Birth: 01/26/1956  Transition of Care [facility]) CM/SW Contact:  Leitha Bleak, RN Phone Number: 02/09/2019, 1:08 PM   Clinical Narrative:       Final next level of care: Home w Home Health Services Barriers to Discharge: No Barriers Identified   Patient Goals and CMS Choice Patient states their goals for this hospitalization and ongoing recovery are:: Patient wishes to go home and continue with Home Health.      Discharge Placement                       Discharge Plan and Services   Discharge Planning Services: CM Consult, Follow-up appt scheduled Post Acute Care Choice: Home Health, Resumption of Svcs/PTA Provider                      [facility] Agency: [facility]        Social Determinants of Health (SDOH) Interventions     Readmission Risk Interventions Readmission Risk Prevention Plan 02/08/2019 02/08/2019 02/05/2019  Transportation Screening - Complete Complete  HRI or Home Care Consult - - -  Social Work Consult for Recovery Care Planning/Counseling - - -  SW consult not completed comments - - -  Palliative Care Screening - - -  Palliative Care Screening Not Complete Comments - - -  Medication Review [employer]) - Complete Complete  PCP or Specialist appointment within 3-5 days of discharge Complete - Complete  HRI or Home Care Consult - Complete Complete  SW Recovery Care/Counseling Consult - Complete Complete  Palliative Care Screening Complete Not Applicable Not Applicable  Skilled Nursing Facility - Not Applicable Not Applicable  Some recent data might be hidden

## 2019-02-09 NOTE — Progress Notes (Signed)
Initial Nutrition Assessment  RD working remotely.   DOCUMENTATION CODES:  Severe malnutrition in context of acute illness/injury  INTERVENTION:  Glucerna Shake po TID, each supplement provides 220 kcal and 10 grams of protein   ProStat 30 ml BID (each 30 ml provides 100 kcal, 15 gr protein)   Magic cup with dinner daily  Multivitamin Daily  NUTRITION DIAGNOSIS:   Severe Malnutrition related to acute illness, chronic illness, poor appetite(acute exacerbation of his chronic end-stage COPD) as evidenced by estimated needs, energy intake < or equal to 50% for > or equal to 5 days, percent weight loss-13% in < 3 months also-pt has multiple recent hospital/ED visits related to respiratory illness).   GOAL:   Patient will meet greater than or equal to 90% of their needs(if feasible given his end-stage disease- palliative consulted)   MONITOR:   PO intake, Supplement acceptance, Weight trends, Labs REASON FOR ASSESSMENT:   Consult Assessment of nutrition requirement/status  ASSESSMENT: Patient is a 63 yo male from home with end-stage COPD (chronic O2) who presents with acute respiratory failure. Patient has had difficulty managing his symptoms at home and has presented on several occasions recently. Home BiPAP/CPAP. History also includes DM-2, HTN and anxiety.   CT finding: pneumonia. LFT's: Alk Phos-129 (H), Alb. 1.7 (L), total protein 5.9 (L) WBC's- 21.3 (H), Hgb 8.8 (L)    Medications reviewed and include: SSI, Lantus, Metformin, Nebs, Prednisone, Protonix, Crestor  Unable to reach patient by telephone but talked to his nurse today. Patient oral nutrition intake is poor -meal intake 0-25% this admission. Patient affirms to RN that he has not been eating well. Details of home intake unknown at this time. He did take the protein modular for RN this morning and asked that she leave the Glucerna shake.  Patient has experienced a significant change in weight - loss of 11 kg (13%) in  the past 2 months which is considered severe.  Usual weight 83-85 kg up to April this year when his weight decreased 11 kg- between March to April. He has maintained the 71-73 kg for the past 3 weeks.   Labs: Hyponatremia BMP Latest Ref Rng & Units 02/09/2019 02/08/2019 02/07/2019  Glucose 70 - 99 mg/dL 175(H) 243(H) 257(H)  BUN 8 - 23 mg/dL 26(H) 22 18  Creatinine 0.61 - 1.24 mg/dL 0.78 0.82 0.78  Sodium 135 - 145 mmol/L 132(L) 134(L) 132(L)  Potassium 3.5 - 5.1 mmol/L 3.9 4.6 4.6  Chloride 98 - 111 mmol/L 97(L) 96(L) 95(L)  CO2 22 - 32 mmol/L '27 26 28  ' Calcium 8.9 - 10.3 mg/dL 9.0 9.3 9.2     NUTRITION - FOCUSED PHYSICAL EXAM:  Unable to complete Nutrition-Focused physical exam at this time.    Diet Order:   Diet Order            Diet heart healthy/carb modified Room service appropriate? Yes; Fluid consistency: Thin  Diet effective now              EDUCATION NEEDS:   (unable to reach by telephone today will assess for nutrition edu needs on f/u) Skin:  Skin Assessment: Reviewed RN Assessment  Last BM:  5/6  Height:   Ht Readings from Last 1 Encounters:  02/08/19 '6\' 2"'  (1.88 m)    Weight:   Wt Readings from Last 1 Encounters:  02/09/19 72.6 kg    Ideal Body Weight:  86 kg  BMI:  Body mass index is 20.55 kg/m.  Estimated Nutritional Needs:  Kcal:  2190-2409 (30-33 kcal/kg/bw)  Protein:  109-131 gr (1.5-1.8 gr/kg/bw)  Fluid:  2.2 liters daily  Colman Cater MS,RD,CSG,LDN Office: [phone] Pager: [phone]

## 2019-02-09 NOTE — Progress Notes (Addendum)
PROGRESS NOTE    Jeff Ray  ZOX:[id number] DOB: [birth year] DOA: 02/07/2019 PCP: Gareth Morgan, MD   Brief Narrative:  Per HPI: KarlWilsonis a63 y.o.male,w severe Copd on home o31, h/o PE, Dm2, Hypertension, w recent admission for acute respiratory failure/ PE, pneumonia, apparently c/o acute dyspnea starting earlier today. Pt tried nebulizer treatment at home without success. Admits to cough with clear/ grey sputum. Pt notes left sided chest pain >2 months. Unchanged. Pt states that he has been compliant with Eliquis. Pt denies fever, chills, palp, n/v, abd pain, diarrhea, brbpr, black stool, dysuria, hematuria, wt loss.  Pt will be admitted for acute hypoxic respiratory failure on chronic respiratory failure secondary to Copd exacerbation, tachycardia, leukoctyosis, abnormal liver function, chronic chest pain.  Patient was admitted with acute on chronic hypoxemic respiratory failure secondary to COPD exacerbation along with findings of pneumonia on CT chest.  He is also noted to have recent diagnosis of pulmonary embolus and was noted to have some demand ischemia which was evaluated by cardiology and 2D echocardiogram reviewed from 5/7 with LVEF of 55%.  Plans are to continue current treatment and hold beta-blocker unless PVCs.  Assessment & Plan:   Principal Problem:   COPD exacerbation (HCC) Active Problems:   HTN (hypertension)   DM (diabetes mellitus) (HCC)   Tachycardia   Abnormal liver function   Severe protein-calorie malnutrition (HCC)   Weight loss, unintentional   Chest pain   Acute on Chronic hypoxic respiratory failure secondary to Copd exacerbationddx h/o PE, pneumonia -Decrease Solu-Medrol to 40 mg twice daily and continue current breathing treatments -Maintain on Eliquis for PE -2D echocardiogram reviewed with LVEF 55%.  Cardiology has signed off.  Consolidation w air bronchogram LULprior CT chest 02/05/19 Blood culture x2 with no growth  noted Continue on cefepime with vancomycin discontinued -We will order procalcitonin Please repeat CT chest in 4-6 weeks  Abnormal liver function / Wt loss (83kg=> 73kg since March 2020) ddx Crestor Continue Crestor for now with LFTs showing improvement Hepatitis panel negative CT abdomen and pelvis reviewed with no acute findings aside from significant atherosclerosis in the aortic branches Check repeat Cmp in AM  H/o PE Cont Eliquis pharmacy to dose  Tachycardia-resolved Tele Troponins with flat trend DC Allegra D - changed albuterol to Xopenex and will consider bisoprolol as needed in the future  Chronic chest pain (hx of coronary calcifications on CT chest) Appreciate cardiology evaluation with repeat echocardiogram demonstrating LVEF 55%.  No need for further evaluation at this time  Dm2 fsbs qc an qhs,  Cont Lantus 15 units [name] qhs Cont Metformin  po bid ISS-we will increase as needed -Noted hyperglycemia, but steroids weaned today; continue the same  Allerghic rhinitis Cont Singulair  po qday Cont Flonase NS Allegra  po qday (stop pseudoephedrine due to tachycardia)  Severe protein calorie malnutrition Start Prostat 30mL po bid Start Glucerna meal supplementations as well Will place consult to nutrition  Anxiety Cont Xanax prn, would try to taper this eventually  Gerd Cont PPI   DVT prophylaxis:Eliquis Code Status: Full Family Communication: None at bedside Disposition Plan: Continue ongoing treatment for pneumonia now with vancomycin discontinued.  Continue on cefepime.  Cardiology has signed off with no active cardiac issue noted.  Plan to taper steroids today and anticipate discharge in the next 24 to 48 hours.  Palliative consulted for goals of care discussion   Consultants:   Cardiology  Palliative care  Procedures:   None  Antimicrobials:   Vancomycin  5/6-5/7  Cefepime 5/6->  Subjective: Patient seen and  evaluated today with no new acute complaints or concerns. No acute concerns or events noted overnight.  He appears to be feeling somewhat better and is back on his baseline home 2 to 3 L nasal cannula.  He still continues to have some cough and shortness of breath along with weakness.  Objective: Vitals:   02/08/19 2005 02/08/19 2121 02/08/19 2244 02/09/19 0551  BP:  103/67  109/69  Pulse:  86 72 89  Resp:  16 18 16   Temp:  97.8 F (36.6 C)  98 F (36.7 C)  TempSrc:  Oral    SpO2: 96% 95% 96% 93%  Weight:    72.6 kg  Height:        Intake/Output Summary (Last 24 hours) at 02/09/2019 0934 Last data filed at 02/09/2019 [id number] Gross per 24 hour  Intake 610.13 ml  Output 1500 ml  Net -889.87 ml   Filed Weights   02/08/19 0201 02/08/19 0540 02/09/19 0551  Weight: 69.3 kg 72.4 kg 72.6 kg    Examination:  General exam: Appears calm and comfortable  Respiratory system: Clear to auscultation. Respiratory effort normal.  On 2 to 3 L nasal cannula Cardiovascular system: S1 & S2 heard, RRR. No JVD, murmurs, rubs, gallops or clicks. No pedal edema. Gastrointestinal system: Abdomen is nondistended, soft and nontender. No organomegaly or masses felt. Normal bowel sounds heard. Central nervous system: Alert and oriented. No focal neurological deficits. Extremities: Symmetric 5 x 5 power. Skin: No rashes, lesions or ulcers Psychiatry: Judgement and insight appear normal. Mood & affect appropriate.     Data Reviewed: I have personally reviewed following labs and imaging studies  CBC: Recent Labs  Lab 02/07/19 1948 02/08/19 0527 02/09/19 0627  WBC 23.4* 22.9* 21.3*  HGB 10.4* 10.0* 8.8*  HCT 33.1* 33.2* 29.6*  MCV 86.0 87.6 87.8  PLT 190 219 227   Basic Metabolic Panel: Recent Labs  Lab 02/07/19 1948 02/08/19 0527 02/09/19 0627  NA 132* 134* 132*  K 4.6 4.6 3.9  CL 95* 96* 97*  CO2 28 26 27   GLUCOSE 257* 243* 175*  BUN 18 22 26*  CREATININE 0.78 0.82 0.78  CALCIUM 9.2 9.3  9.0   GFR: Estimated Creatinine Clearance: 95.8 mL/min (by C-G formula based on SCr of 0.78 mg/dL). Liver Function Tests: Recent Labs  Lab 02/07/19 1948 02/08/19 0527 02/09/19 0627  AST 19 17 14*  ALT 78* 66* 47*  ALKPHOS 161* 150* 129*  BILITOT 0.5 0.8 0.4  PROT 6.4* 6.5 5.9*  ALBUMIN 1.8* 1.8* 1.7*   No results for input(s): LIPASE, AMYLASE in the last 168 hours. No results for input(s): AMMONIA in the last 168 hours. Coagulation Profile: No results for input(s): INR, PROTIME in the last 168 hours. Cardiac Enzymes: Recent Labs  Lab 02/08/19 0013 02/08/19 0527 02/08/19 1231  TROPONINI 0.11* 0.10* 0.09*   BNP (last 3 results) No results for input(s): PROBNP in the last 8760 hours. HbA1C: No results for input(s): HGBA1C in the last 72 hours. CBG: Recent Labs  Lab 02/08/19 0735 02/08/19 1133 02/08/19 1641 02/08/19 2150 02/09/19 0736  GLUCAP 242* 233* 94 211* 138*   Lipid Profile: Recent Labs    02/08/19 0527  CHOL 140  HDL 39*  LDLCALC 82  TRIG 96  CHOLHDL 3.6   Thyroid Function Tests: Recent Labs    02/08/19 0013  TSH 0.410   Anemia Panel: No results for input(s): VITAMINB12, FOLATE, FERRITIN, TIBC,  IRON, RETICCTPCT in the last 72 hours. Sepsis Labs: No results for input(s): PROCALCITON, LATICACIDVEN in the last 168 hours.  Recent Results (from the past 240 hour(s))  SARS Coronavirus 2 (CEPHEID - Performed in [facility] hospital lab), Hosp Order     Status: None   Collection Time: 02/07/19  7:47 PM  Result Value Ref Range Status   SARS Coronavirus 2 NEGATIVE NEGATIVE Final    Comment: (NOTE) If result is NEGATIVE SARS-CoV-2 target nucleic acids are NOT DETECTED. The SARS-CoV-2 RNA is generally detectable in upper and lower  respiratory specimens during the acute phase of infection. The lowest  concentration of SARS-CoV-2 viral copies this assay can detect is 250  copies / mL. A negative result does not preclude SARS-CoV-2 infection  and  should not be used as the sole basis for treatment or other  patient management decisions.  A negative result may occur with  improper specimen collection / handling, submission of specimen other  than nasopharyngeal swab, presence of viral mutation(s) within the  areas targeted by this assay, and inadequate number of viral copies  (<250 copies / mL). A negative result must be combined with clinical  observations, patient history, and epidemiological information. If result is POSITIVE SARS-CoV-2 target nucleic acids are DETECTED. The SARS-CoV-2 RNA is generally detectable in upper and lower  respiratory specimens dur ing the acute phase of infection.  Positive  results are indicative of active infection with SARS-CoV-2.  Clinical  correlation with patient history and other diagnostic information is  necessary to determine patient infection status.  Positive results do  not rule out bacterial infection or co-infection with other viruses. If result is PRESUMPTIVE POSTIVE SARS-CoV-2 nucleic acids MAY BE PRESENT.   A presumptive positive result was obtained on the submitted specimen  and confirmed on repeat testing.  While 2019 novel coronavirus  (SARS-CoV-2) nucleic acids may be present in the submitted sample  additional confirmatory testing may be necessary for epidemiological  and / or clinical management purposes  to differentiate between  SARS-CoV-2 and other Sarbecovirus currently known to infect humans.  If clinically indicated additional testing with an alternate test  methodology [phone]) is advised. The SARS-CoV-2 RNA is generally  detectable in upper and lower respiratory sp ecimens during the acute  phase of infection. The expected result is Negative. Fact Sheet for Patients:  BoilerBrush.com.cy Fact Sheet for Healthcare Providers: https://pope.com/ This test is not yet approved or cleared by the [place] FDA and has been  authorized for detection and/or diagnosis of SARS-CoV-2 by FDA under an Emergency Use Authorization (EUA).  This EUA will remain in effect (meaning this test can be used) for the duration of the COVID-19 declaration under Section 564(b)(1) of the Act, 21 U.S.C. section 360bbb-3(b)(1), unless the authorization is terminated or revoked sooner. Performed at [facility], [address], [place], [place] [address]   Culture, blood (Routine X 2) w Reflex to ID Panel     Status: None (Preliminary result)   Collection Time: 02/08/19 12:13 AM  Result Value Ref Range Status   Specimen Description BLOOD LEFT ANTECUBITAL  Final   Special Requests   Final    BOTTLES DRAWN AEROBIC AND ANAEROBIC Blood Culture adequate volume   Culture   Final    NO GROWTH 1 DAY Performed at [facility], [address], [place], [place] [address]    Report Status PENDING  Incomplete  Culture, blood (Routine X 2) w Reflex to ID Panel  Status: None (Preliminary result)   Collection Time: 02/08/19 12:19 AM  Result Value Ref Range Status   Specimen Description BLOOD RIGHT HAND  Final   Special Requests   Final    BOTTLES DRAWN AEROBIC AND ANAEROBIC Blood Culture adequate volume   Culture   Final    NO GROWTH 1 DAY Performed at [facility], [address] [place], [place] [address]    Report Status PENDING  Incomplete  MRSA PCR Screening     Status: None   Collection Time: 02/08/19  7:12 AM  Result Value Ref Range Status   MRSA by PCR NEGATIVE NEGATIVE Final    Comment:        The GeneXpert MRSA Assay (FDA approved for NASAL specimens only), is one component of a comprehensive MRSA colonization surveillance program. It is not intended to diagnose MRSA infection nor to guide or monitor treatment for MRSA infections. Performed at [facility], [address] [place], [place] [address]          Radiology Studies: Ct Abdomen Pelvis W Contrast  Result Date: 02/08/2019 CLINICAL DATA:  Weight loss,  abnormal liver function test, left-sided chest pain history of pneumonia EXAM: CT ABDOMEN AND PELVIS WITH CONTRAST TECHNIQUE: Multidetector CT imaging of the abdomen and pelvis was performed using the standard protocol following bolus administration of intravenous contrast. CONTRAST:  OMNIPAQUE IOHEXOL 300 MG/ML SOLN, additional oral enteric contrast COMPARISON:  CT chest, 02/05/2019 FINDINGS: Lower chest: Heterogeneous airspace opacity of the dependent left lower lobe, bibasilar fibrotic change, and emphysema as seen on prior CT of the chest. Hepatobiliary: No focal liver abnormality is seen. No gallstones, gallbladder wall thickening, or biliary dilatation. Pancreas: Unremarkable. No pancreatic ductal dilatation or surrounding inflammatory changes. Spleen: Normal in size without focal abnormality. Adrenals/Urinary Tract: Adrenal glands are unremarkable. Kidneys are normal, without renal calculi, focal lesion, or hydronephrosis. Bladder is unremarkable. Stomach/Bowel: Stomach is within normal limits. Appendix appears normal. No evidence of bowel wall thickening, distention, or inflammatory changes. Large burden of stool in the colon. Vascular/Lymphatic: Severe mixed calcific atherosclerosis of the abdominal aorta and branch vessels. 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 No enlarged abdominal or pelvic lymph nodes. Reproductive: No mass or other abnormality. Other: No abdominal wall hernia or abnormality. No abdominopelvic ascites. Musculoskeletal: No acute or significant osseous findings. IMPRESSION: 1. No CT abnormality of the abdomen or pelvis to explain abnormal LFTs or weight loss. 2. Severe mixed calcific atherosclerosis of the abdominal aorta and branch vessels. 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 3. Heterogeneous airspace opacity of the dependent left lower lobe, bibasilar fibrotic change, and emphysema as seen on prior CT of the chest. Electronically Signed   By: Lauralyn Primes M.D.   On: 02/08/2019 14:24   Dg Chest Port 1 View  Result Date: 02/07/2019 CLINICAL DATA:  Shortness of breath EXAM: PORTABLE CHEST 1 VIEW COMPARISON:  02/05/2019 FINDINGS: Cardiac shadow is within normal limits. The right lung is hyperaerated consistent with COPD and stable from the previous exam. Chronic consolidative densities are noted in the left upper lobe stable from the prior exam. No new focal abnormality is noted. IMPRESSION: Stable changes in the left upper lobe when compare with the prior CT examination from 2 days previous. Electronically Signed  By: Alcide Clever M.D.   On: 02/07/2019 20:32        Scheduled Meds:  apixaban  10 mg Oral BID   Followed by   Melene Muller ON 02/12/2019] apixaban  5 mg Oral BID   budesonide (PULMICORT) nebulizer solution  0.25 mg Nebulization BID   chlorhexidine  15 mL Mouth Rinse BID   feeding supplement (GLUCERNA SHAKE)  237 mL Oral TID BM   feeding supplement (PRO-STAT SUGAR FREE 64)  30 mL Oral BID   fluticasone  2 spray Each Nare Daily   fluticasone furoate-vilanterol  2 puff Inhalation Daily   And   umeclidinium bromide  2 puff Inhalation Daily   guaiFENesin  1,200 mg Oral BID   insulin aspart  0-15 Units Subcutaneous TID WC   insulin aspart  0-5 Units Subcutaneous QHS   insulin aspart  3 Units Subcutaneous TID WC   insulin glargine  15 Units Subcutaneous QHS   ipratropium  2 puff Inhalation Once   ipratropium  2 puff Inhalation Once   ipratropium  0.5 mg Nebulization Q6H   levalbuterol  0.63 mg Nebulization Q6H   loratadine  10 mg Oral Daily   mouth rinse  15 mL Mouth Rinse q12n4p   metFORMIN  1,000 mg Oral BID WC   methylPREDNISolone (SOLU-MEDROL) injection  40 mg Intravenous Q12H   montelukast  10 mg  Oral q morning - 10a   multivitamin with minerals  1 tablet Oral Daily   oxybutynin  5 mg Oral BID   pantoprazole  40 mg Oral q morning - 10a   rosuvastatin  40 mg Oral QHS   sodium chloride flush  3 mL Intravenous Q12H   Continuous Infusions:  sodium chloride     ceFEPime (MAXIPIME) IV 2 g (02/09/19 0619)     LOS: 2 days    Time spent: 30 minutes    Marshe Shrestha Hoover Brunette, DO [facility] Pager [phone]  If 7PM-7AM, please contact night-coverage [email or web address] Password [facility] 02/09/2019, 9:34 AM

## 2019-02-09 NOTE — Care Management Important Message (Signed)
Important Message  Patient Details  Name: Jeff Ray MRN: [id number] Date of Birth: 07-12-1956   Medicare Important Message Given:  Yes    Corey Harold 02/09/2019, 1:42 PM

## 2019-02-10 LAB — GLUCOSE, CAPILLARY
Glucose-Capillary: 69 mg/dL — ABNORMAL LOW (ref 70–99)
Glucose-Capillary: 189 mg/dL — ABNORMAL HIGH (ref 70–99)

## 2019-02-10 LAB — COMPREHENSIVE METABOLIC PANEL
ALT: 43 U/L (ref 0–44)
AST: 16 U/L (ref 15–41)
Albumin: 1.9 g/dL — ABNORMAL LOW (ref 3.5–5.0)
Alkaline Phosphatase: 133 U/L — ABNORMAL HIGH (ref 38–126)
Anion gap: 10 (ref 5–15)
BUN: 26 mg/dL — ABNORMAL HIGH (ref 8–23)
CO2: 29 mmol/L (ref 22–32)
Calcium: 9.6 mg/dL (ref 8.9–10.3)
Chloride: 98 mmol/L (ref 98–111)
Creatinine, Ser: 0.77 mg/dL (ref 0.61–1.24)
GFR calc Af Amer: >60 mL/min (ref >60)
GFR calc non Af Amer: >60 mL/min (ref >60)
Glucose, Bld: 90 mg/dL (ref 70–99)
Potassium: 4.6 mmol/L (ref 3.5–5.1)
Sodium: 137 mmol/L (ref 135–145)
Total Bilirubin: 0.6 mg/dL (ref 0.3–1.2)
Total Protein: 6.6 g/dL (ref 6.5–8.1)

## 2019-02-10 LAB — CBC
HCT: 33.3 % — ABNORMAL LOW (ref 39.0–52.0)
Hemoglobin: 9.9 g/dL — ABNORMAL LOW (ref 13.0–17.0)
MCH: 26.5 pg (ref 26.0–34.0)
MCHC: 29.7 g/dL — ABNORMAL LOW (ref 30.0–36.0)
MCV: 89 fL (ref 80.0–100.0)
Platelets: 255 10*3/uL (ref 150–400)
RBC: 3.74 MIL/uL — ABNORMAL LOW (ref 4.22–5.81)
RDW: 15 % (ref 11.5–15.5)
WBC: 22 10*3/uL — ABNORMAL HIGH (ref 4.0–10.5)
nRBC: 0.1 % (ref 0.0–0.2)

## 2019-02-10 MED ORDER — LEVALBUTEROL HCL 0.63 MG/3ML IN NEBU
0.6300 mg | INHALATION_SOLUTION | Freq: Four times a day (QID) | RESPIRATORY_TRACT | Status: DC
  Administered 2019-02-10: 0.63 mg via RESPIRATORY_TRACT
  Filled 2019-02-10 (×2): qty 3

## 2019-02-10 MED ORDER — IPRATROPIUM BROMIDE 0.02 % IN SOLN
0.5000 mg | Freq: Four times a day (QID) | RESPIRATORY_TRACT | Status: DC
  Administered 2019-02-10: 0.5 mg via RESPIRATORY_TRACT
  Filled 2019-02-10 (×2): qty 2.5

## 2019-02-10 MED ORDER — GLUCERNA SHAKE PO LIQD: 237.0000 mL | Freq: Three times a day (TID) | ORAL | 0 refills | Status: AC

## 2019-02-10 MED ORDER — AMOXICILLIN-POT CLAVULANATE 875-125 MG PO TABS: 1.0000 | ORAL_TABLET | Freq: Two times a day (BID) | ORAL | 0 refills | Status: AC

## 2019-02-10 MED ORDER — PREDNISONE 20 MG PO TABS: 40.0000 mg | ORAL_TABLET | Freq: Every day | ORAL | 0 refills | Status: AC

## 2019-02-10 MED ORDER — PRO-STAT SUGAR FREE PO LIQD: 30.0000 mL | Freq: Two times a day (BID) | ORAL | 0 refills | Status: AC

## 2019-02-10 NOTE — TOC Transition Note (Signed)
Transition of Care [facility]) - CM/SW Discharge Note   Patient Details  Name: JAYR DECASTRO MRN: [id number] Date of Birth: [birth year]  [facility]) CM/SW Contact:  Virgel Manifold, RN Phone Number: 02/10/2019, 10:50 AM   Clinical Narrative:   Patient to be discharged per MD order. Orders in place for home health services. Previous RNCM had began workup for home health through [facility]. Notified Barbara Cower of pending discharge. No DME needs.      Final next level of care: Home w Home Health Services Barriers to Discharge: No Barriers Identified   Patient Goals and CMS Choice Patient states their goals for this hospitalization and ongoing recovery are:: Patient wishes to go home and continue with Home Health.      Discharge Placement                       Discharge Plan and Services   Discharge Planning Services: CM Consult, Follow-up appt scheduled Post Acute Care Choice: Home Health, Resumption of Svcs/PTA Provider                      [facility] Agency: [facility] Date HH Agency Contacted: 02/10/19 Time HH Agency Contacted: 1050 Representative spoke with at [facility] Agency: Barbara Cower  Social Determinants of Health (SDOH) Interventions     Readmission Risk Interventions Readmission Risk Prevention Plan 02/08/2019 02/08/2019 02/05/2019  Transportation Screening - Complete Complete  HRI or Home Care Consult - - -  Social Work Consult for Recovery Care Planning/Counseling - - -  SW consult not completed comments - - -  Palliative Care Screening - - -  Palliative Care Screening Not Complete Comments - - -  Medication Review [employer]) - Complete Complete  PCP or Specialist appointment within 3-5 days of discharge Complete - Complete  HRI or Home Care Consult - Complete Complete  SW Recovery Care/Counseling Consult - Complete Complete  Palliative Care Screening Complete Not Applicable Not Applicable  Skilled Nursing Facility - Not Applicable Not  Applicable  Some recent data might be hidden

## 2019-02-10 NOTE — Discharge Summary (Signed)
Physician Discharge Summary  Jeff Ray ZOX:[id number] DOB: 1956-01-10 DOA: 02/07/2019  PCP: Gareth Morgan, MD  Admit date: 02/07/2019  Discharge date: 02/10/2019  Admitted From:Home  Disposition:  Home  Recommendations for Outpatient Follow-up:  1. Follow up with PCP in 1-2 weeks 2. Follow-up with Dr. Juanetta Gosling on 5/12 as scheduled 3. Continue on Augmentin and prednisone as prescribed 4. Continue on home 2 to 3 L nasal cannula as previously prescribed 5. Repeat CT chest in 4 to 6 weeks to reassess left upper lobe region  Home Health: None  Equipment/Devices: None  Discharge Condition: Stable  CODE STATUS: Full  Diet recommendation: Heart Healthy/carb modified  Brief/Interim Summary: Per HPI: KarlWilsonis a63 y.o.male,w severe Copd on home o2, h/o PE, Dm2, Hypertension, w recent admission for acute respiratory failure/ PE, pneumonia, apparently c/o acute dyspnea starting earlier today. Pt tried nebulizer treatment at home without success. Admits to cough with clear/ grey sputum. Pt notes left sided chest pain >2 months. Unchanged. Pt states that he has been compliant with Eliquis. Pt denies fever, chills, palp, n/v, abd pain, diarrhea, brbpr, black stool, dysuria, hematuria, wt loss.  Pt will be admitted for acute hypoxic respiratory failure on chronic respiratory failure secondary to Copd exacerbation, tachycardia, leukoctyosis, abnormal liver function, chronic chest pain.  Patient was admitted with acute on chronic hypoxemic respiratory failure secondary to COPD exacerbation along with findings of pneumonia on CT chest. He is also noted to have recent diagnosis of pulmonary embolus and was noted to have some demand ischemia which was evaluated by cardiology and 2D echocardiogram reviewed from 5/7 with LVEF of 55%.  Patient was treated with IV cefepime over the last 3 days and has remained afebrile and is clinically stable and improved.  He will be discharged with  Augmentin for 4 more days to finish 7-day course of treatment.  No growth noted on cultures.  COVID-19 testing also noted to be negative.  He was also on IV methylprednisolone which will now be transitioned to oral prednisone for several more days as well.  He will follow-up with Dr. Juanetta Gosling of pulmonology on 5/12 as scheduled.  Will also follow-up with PCP.  He was also seen by palliative care to discuss goals of care which she will discuss further with family members upon arrival to home.  No other acute events noted during the course of this admission.  Discharge Diagnoses:  Principal Problem:   COPD exacerbation (HCC) Active Problems:   HTN (hypertension)   DM (diabetes mellitus) (HCC)   Tachycardia   Abnormal liver function   Severe protein-calorie malnutrition (HCC)   Weight loss, unintentional   Chest pain   Goals of care, counseling/discussion   Palliative care encounter  Principal discharge diagnosis: Acute on chronic hypoxemic respiratory failure secondary to COPD exacerbation with left upper lobe pneumonia.  Discharge Instructions  Discharge Instructions    Diet - low sodium heart healthy   Complete by:  As directed    Increase activity slowly   Complete by:  As directed      Allergies as of 02/10/2019   No Known Allergies     Medication List    TAKE these medications   ALPRAZolam 1 MG tablet Commonly known as:  XANAX Take 0.5 tablets (0.5 mg total) by mouth 2 (two) times daily as needed for anxiety.   amoxicillin-clavulanate 875-125 MG tablet Commonly known as:  Augmentin Take 1 tablet by mouth 2 (two) times daily for 4 days.   apixaban 5 MG  Tabs tablet Commonly known as:  ELIQUIS Take 2 tabs po BID thru 5/10, then 1 po BID starting 5/11 What changed:    how much to take  how to take this  when to take this  additional instructions   CENTRUM SILVER 50+MEN PO Take 1 tablet by mouth daily.   feeding supplement (GLUCERNA SHAKE) Liqd Take 237 mLs by  mouth 3 (three) times daily between meals for 30 days.   feeding supplement (PRO-STAT SUGAR FREE 64) Liqd Take 30 mLs by mouth 2 (two) times daily.   fexofenadine-pseudoephedrine 180-240 MG 24 hr tablet Commonly known as:  ALLEGRA-D 24 Take 1 tablet by mouth daily as needed (allergies).   fluticasone 50 MCG/ACT nasal spray Commonly known as:  FLONASE Place 2 sprays into both nostrils daily.   Guaifenesin 1200 MG Tb12 Commonly known as:  Mucinex Maximum Strength Take 1 tablet (1,200 mg total) by mouth 2 (two) times daily.   Insulin Glargine 100 UNIT/ML Solostar Pen Commonly known as:  LANTUS Inject 15 Units into the skin at bedtime.   ipratropium-albuterol 0.5-2.5 (3) MG/3ML Soln Commonly known as:  DUONEB Take 3 mLs by nebulization every 4 (four) hours as needed. For shortness of breath   metFORMIN 500 MG 24 hr tablet Commonly known as:  GLUCOPHAGE-XR Take 2 tablets (1,000 mg total) by mouth 2 (two) times daily.   montelukast 10 MG tablet Commonly known as:  SINGULAIR Take 10 mg by mouth every morning.   oxybutynin 5 MG tablet Commonly known as:  DITROPAN Take 1 tablet (5 mg total) by mouth 2 (two) times daily.   pantoprazole 40 MG tablet Commonly known as:  PROTONIX Take 40 mg by mouth every morning.   predniSONE 20 MG tablet Commonly known as:  Deltasone Take 2 tablets (40 mg total) by mouth daily for 5 days. What changed:    how much to take  how to take this  when to take this  additional instructions   ProAir HFA 108 (90 Base) MCG/ACT inhaler Generic drug:  albuterol Inhale 2 puffs into the lungs every 6 (six) hours as needed for wheezing or shortness of breath. Shortness of Breath   rosuvastatin 40 MG tablet Commonly known as:  CRESTOR Take 40 mg by mouth at bedtime.   Trelegy Ellipta 100-62.5-25 MCG/INH Aepb Generic drug:  Fluticasone-Umeclidin-Vilant Take 2 puffs by mouth daily.      Follow-up Information    Kari Baars, MD Follow up.    Specialty:  Pulmonary Disease Why:  follow up as previously schedule May 12th at 2:30 Contact information: [address] [place] [address] [phone]        Gareth Morgan, MD Follow up in 1 week(s).   Specialty:  Family Medicine Contact information: [address] [place] [address] [phone]        Antoine Poche, MD .   Specialty:  Cardiology Contact information: [address] [place] [address] [phone]          No Known Allergies  Consultations:  Cardiology  Palliative Care   Procedures/Studies: Ct Chest Wo Contrast  Result Date: 02/01/2019 CLINICAL DATA:  63 year old male with respiratory failure, unresolved left upper lobe opacity since 01/09/2019. Negative for COVID-19 X3 over the course of this month. EXAM: CT CHEST WITHOUT CONTRAST TECHNIQUE: Multidetector CT imaging of the chest was performed following the standard protocol without IV contrast. COMPARISON:  Portable chest earlier today, and earlier. Chest CT 06/03/2011. FINDINGS: Cardiovascular: No cardiomegaly or pericardial  effusion. Calcified coronary artery atherosclerosis and/or stents. Lesser Calcified aortic atherosclerosis. Vascular patency is not evaluated in the absence of IV contrast. Mediastinum/Nodes: Reactive appearing prevascular lymph nodes in the superior mediastinum are individually up to 8 millimeter short axis. Other mediastinal nodes are stable since 2012 and within normal limits. Hilar lymph nodes not well evaluated in the absence of contrast. Lungs/Pleura: Moderate to severe chronic centrilobular emphysema. Major airways are patent. Respiratory motion artifact most pronounced in the right lower lung. No acute right lung opacity other than perhaps mildly increased peribronchial thickening since 2012. Confluent abnormal in the anterior and posterior left upper lobe opacity in the form of consolidation and nodular septal thickening amidst severe emphysema.  Possible cavitation demonstrated on series 4, image 37. Consolidation continues into the superior segment of the left lower lobe and also the medial basal segment of the left lower lobe with air bronchograms. No definite cavitation in the left lower lobe. Central left airways remain patent. Superimposed mild reticulonodular distal peribronchial opacity in the lingula. No pleural effusion. Upper Abdomen: Negative visible noncontrast liver, spleen, pancreas, adrenal glands, kidneys, and bowel in the upper abdomen. Musculoskeletal: Osteopenia. No acute osseous abnormality identified. IMPRESSION: 1. Severe Emphysema (ICD10-J43.9) with superimposed Multilobar Left Lung Pneumonia and suspicion of cavitation/necrosis in the left upper lobe. Consolidated superior and medial basal segments of the left lower lobe. Possible early infection in the lingula. No pleural effusion. 2. Reactive appearing superior mediastinal lymph nodes. 3. No acute findings in the right lung. 4. Calcified coronary artery atherosclerosis. Electronically Signed   By: Odessa Fleming M.D.   On: 02/01/2019 23:29   Ct Angio Chest Pe W And/or Wo Contrast  Result Date: 02/05/2019 CLINICAL DATA:  Recent discharge from [facility] on 02/01/2019 now with recurrent severe shortness of breath and mid chest pain. EXAM: CT ANGIOGRAPHY CHEST WITH CONTRAST TECHNIQUE: Multidetector CT imaging of the chest was performed using the standard protocol during bolus administration of intravenous contrast. Multiplanar CT image reconstructions and MIPs were obtained to evaluate the vascular anatomy. CONTRAST:  OMNIPAQUE IOHEXOL 350 MG/ML SOLN COMPARISON:  02/01/2019 FINDINGS: Vascular Findings: There is adequate opacification of the pulmonary arterial system with the main pulmonary artery measuring 598 Hounsfield units. Mixed short-segment occlusive though predominantly nonocclusive pulmonary emboli are seen at the peripheral aspect of the left lower lobe pulmonary artery  (image 54, series 5), extending to involve several adjacent segmental and subsegmental pulmonary arteries (coronal images 89 through 96, series 8). There is a very minimal amount of nonocclusive pulmonary embolism seen at the bifurcation of the right interlobar pulmonary artery (image 64, series 5) as well as the bifurcation of the right middle lobar pulmonary artery (image 70, series 5). Overall bilateral pulmonary embolism clot burden is deemed small in volume without definitive enlargement of the caliber the main pulmonary artery or definitive interventricular septal bowing. Normal heart size. Coronary artery calcifications. No pericardial effusion. Atherosclerotic plaque within a normal caliber thoracic aorta. No definite evidence of thoracic aortic dissection or periaortic stranding on this nongated examination. Bovine configuration of the aortic arch. The branch vessels of the aortic arch appear patent throughout their imaged courses. Review of the MIP images confirms the above findings. ---------------------------------------------------------------------------------- Nonvascular Findings: Mediastinum/Lymph Nodes: Mediastinal and left hilar lymphadenopathy with index subcarinal nodal conglomeration measuring 1.3 cm in greatest short axis diameter (image 54, series 5 and index left hilar lymph node measuring 1.1 cm (image 52, series 5). Prominent prevascular lymph node measures 0.9 cm in greatest short  axis diameter (image 42). No axillary lymphadenopathy. Lungs/Pleura: Redemonstrated severe atypical predominant mixed centrilobular and paraseptal emphysematous change, most severely affecting the left lung apex. Additionally, there is age-indeterminate consolidative masslike opacities involving the posteromedial aspect of both the left upper (representative image 49, series 7 and lower (image 101, series 7) lobes with associated air bronchograms. The right lung remains well aerated however note is made of  short-segment occlusive debris within several basilar segmental bronchi (images 115 and 122, series 7), new compared to the 01/2019 examination. No pleural effusion or pneumothorax. Upper abdomen: Limited early arterial phase evaluation of the upper abdomen is unremarkable. Musculoskeletal: No acute or aggressive osseous abnormalities. Degenerative change of the lower cervical spine is suspected though incompletely evaluated. Regional soft tissues appear normal. Normal appearance of the thyroid gland. IMPRESSION: 1. Examination is positive for small volume bilateral pulmonary embolism, left greater than right, without CT evidence of right-sided heart strain. 2. Severe apical predominant emphysematous change, most severely affecting the left lung apex. Emphysema (ICD10-J43.9). 3. Redemonstrated consolidative masslike left upper and lower lobe airspace opacities with associated air bronchograms, potentially infectious in etiology though underlying discrete lesions are not excluded on the basis of this examination. Follow-up chest CT in 4-6 weeks after treatment is recommended to ensure resolution. 4. Mediastinal and left hilar lymphadenopathy, again, potentially reactive in etiology. Attention on recommended follow-up is advised. 5. Occlusive debris within right lower lobe segmental and subsegmental bronchi without associated airspace opacity, new compared to the 02/01/2019 examination, nonspecific though could be seen in the setting of aspiration. Clinical correlation is advised. 6. Coronary artery calcifications. Aortic Atherosclerosis (ICD10-I70.0). Electronically Signed   By: Simonne ComeJohn  Watts M.D.   On: 02/05/2019 10:25   Ct Abdomen Pelvis W Contrast  Result Date: 02/08/2019 CLINICAL DATA:  Weight loss, abnormal liver function test, left-sided chest pain history of pneumonia EXAM: CT ABDOMEN AND PELVIS WITH CONTRAST TECHNIQUE: Multidetector CT imaging of the abdomen and pelvis was performed using the standard  protocol following bolus administration of intravenous contrast. CONTRAST:  100mL OMNIPAQUE IOHEXOL 300 MG/ML SOLN, additional oral enteric contrast COMPARISON:  CT chest, 02/05/2019 FINDINGS: Lower chest: Heterogeneous airspace opacity of the dependent left lower lobe, bibasilar fibrotic change, and emphysema as seen on prior CT of the chest. Hepatobiliary: No focal liver abnormality is seen. No gallstones, gallbladder wall thickening, or biliary dilatation. Pancreas: Unremarkable. No pancreatic ductal dilatation or surrounding inflammatory changes. Spleen: Normal in size without focal abnormality. Adrenals/Urinary Tract: Adrenal glands are unremarkable. Kidneys are normal, without renal calculi, focal lesion, or hydronephrosis. Bladder is unremarkable. Stomach/Bowel: Stomach is within normal limits. Appendix appears normal. No evidence of bowel wall thickening, distention, or inflammatory changes. Large burden of stool in the colon. Vascular/Lymphatic: Severe mixed calcific atherosclerosis of the abdominal aorta and branch vessels. 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 No enlarged abdominal or pelvic lymph nodes. Reproductive: No mass or other abnormality. Other: No abdominal wall hernia or abnormality. No abdominopelvic ascites. Musculoskeletal: No acute or significant osseous findings. IMPRESSION: 1. No CT abnormality of the abdomen or pelvis to explain abnormal LFTs or weight loss. 2. Severe mixed calcific atherosclerosis of the abdominal aorta and branch vessels. 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 3. Heterogeneous airspace opacity of the dependent left lower lobe, bibasilar fibrotic change,  and emphysema as seen on prior CT of the chest. Electronically Signed   By: Lauralyn Primes M.D.   On: 02/08/2019 14:24   Dg Chest Port 1 View  Result Date: 02/07/2019 CLINICAL DATA:  Shortness of breath EXAM: PORTABLE CHEST 1 VIEW COMPARISON:  02/05/2019 FINDINGS: Cardiac shadow is within normal limits. The right lung is hyperaerated consistent with COPD and stable from the previous exam. Chronic consolidative densities are noted in the left upper lobe stable from the prior exam. No new focal abnormality is noted. IMPRESSION: Stable changes in the left upper lobe when compare with the prior CT examination from 2 days previous. Electronically Signed   By: Alcide Clever M.D.   On: 02/07/2019 20:32   Dg Chest Portable 1 View  Result Date: 02/01/2019 CLINICAL DATA:  63 year old male with respiratory failure, negative for COVID-19. X3 over the course of this month. EXAM: PORTABLE CHEST 1 VIEW COMPARISON:  01/26/2019 and earlier. FINDINGS: Portable AP upright view at 1912 hours. Right PICC line has been removed. Continued coarse and confluent opacity in the left upper lobe, progressed compared to 01/26/2019. Superimposed pulmonary hyperinflation and evidence of emphysema. No pneumothorax or pleural effusion. Normal cardiac size and mediastinal contours. Visualized tracheal air column is within normal limits. IMPRESSION: Chronic lung disease with unresolved confluent left upper lobe opacity which is mildly progressed since 01/26/2019. This was first demonstrated on 01/09/2019, and was new from 12/09/2018 arguing against neoplasm and favoring an infectious/inflammatory etiology. No pleural effusion or new cardiopulmonary abnormality. Electronically Signed   By: Odessa Fleming M.D.   On: 02/01/2019 19:30   Portable Chest 1 View  Result Date: 01/26/2019 CLINICAL DATA:  63 year old male with respiratory failure. Negative for COVID-19. EXAM: PORTABLE CHEST 1 VIEW COMPARISON:  01/25/2019 and earlier. FINDINGS: Portable AP upright view at 0523 hours. Underlying large lung volumes. Stable cardiac size and  mediastinal contours. The right lung appears clear, with evidence of upper lung emphysema. Right PICC line has been placed and tip is at the level of the lower SVC. Patchy and nodular left upper lung opacity has mildly regressed since yesterday. Stable left lung ventilation elsewhere. No pneumothorax or pleural effusion. IMPRESSION: 1. Right PICC line placed, tip at the lower SVC level. 2. Evidence of emphysema. Left upper lung opacity mildly regressed since yesterday. No new cardiopulmonary abnormality. Electronically Signed   By: Odessa Fleming M.D.   On: 01/26/2019 06:04   Dg Chest Portable 1 View  Result Date: 01/25/2019 CLINICAL DATA:  Acute on chronic respiratory failure. EXAM: PORTABLE CHEST 1 VIEW COMPARISON:  Single-view of the chest 01/16/2019, 01/15/2019 and 10/27/2018. FINDINGS: The lungs are emphysematous. Increased airspace disease is seen in the left upper lobe. There is basilar fibrosis, worse on the left. Heart size is normal. No pneumothorax. IMPRESSION: Increased left upper lobe airspace opacity worrisome for pneumonia superimposed on scar. Emphysema and basilar fibrosis, worse on the left. Electronically Signed   By: Drusilla Kanner M.D.   On: 01/25/2019 07:48   Dg Chest Port 1 View  Result Date: 01/16/2019 CLINICAL DATA:  63 year old male with pneumonia and acute on chronic respiratory failure with hypercapnia EXAM: PORTABLE CHEST 1 VIEW COMPARISON:  Multiple prior chest x-rays, most recently 01/15/2019 FINDINGS: Stable appearance of the chest without significant interval change over the last 24 hours. Persistent interstitial and airspace opacities in the left upper and left lower lobes. No evidence of new airspace opacity, pleural effusion, pneumothorax or pulmonary edema. Stable background hyperinflation, emphysematous changes and  chronic bronchitic changes. IMPRESSION: Stable chest x-ray without significant interval change. Persistent left upper and left lower lobe airspace opacities may  represent residual areas of infiltrate/pneumonia versus developing pleuroparenchymal scarring. Electronically Signed   By: Malachy Moan M.D.   On: 01/16/2019 14:58   Dg Chest Port 1 View  Result Date: 01/15/2019 CLINICAL DATA:  Shortness of breath EXAM: PORTABLE CHEST 1 VIEW COMPARISON:  01/14/2019 FINDINGS: Cardiac shadows within normal limits. The right lung is again clear. Left lung again demonstrates persistent infiltrates in the left upper lobe and left base stable from the previous exam. No new focal abnormality is noted. IMPRESSION: Stable left-sided infiltrates when compare with the previous exam. Electronically Signed   By: Alcide Clever M.D.   On: 01/15/2019 03:12   Dg Chest Port 1 View  Result Date: 01/14/2019 CLINICAL DATA:  Dyspnea. EXAM: PORTABLE CHEST 1 VIEW COMPARISON:  01/12/2019 FINDINGS: COPD with hyperinflation. Left upper lobe infiltrate unchanged. Left lower lobe infiltrate with progressive volume loss. No significant pleural effusion. Negative for heart failure. IMPRESSION: Left upper lobe and left lower lobe infiltrates compatible with pneumonia. Progressive volume loss in the left lung base. No effusion. Electronically Signed   By: Marlan Palau M.D.   On: 01/14/2019 13:52   Dg Chest Port 1 View  Result Date: 01/12/2019 CLINICAL DATA:  Respiratory failure EXAM: PORTABLE CHEST 1 VIEW COMPARISON:  01/09/2019 FINDINGS: Heart is normal size. Diffuse airspace disease throughout the left lung, most confluent in the left upper lobe. This is unchanged. No confluent opacity on the right. Heart is normal size. IMPRESSION: Diffuse left lung airspace disease, unchanged since prior study. Electronically Signed   By: Charlett Nose M.D.   On: 01/12/2019 08:08   [place] Ekg Site Rite  Result Date: 01/25/2019 If Site Rite image not attached, placement could not be confirmed due to current cardiac rhythm.    Discharge Exam: Vitals:   02/10/19 0514 02/10/19 0514  BP: 109/66 109/66   Pulse: 97 98  Resp: 16 16  Temp:    SpO2:  92%   Vitals:   02/09/19 2135 02/09/19 2330 02/10/19 0514 02/10/19 0514  BP: 110/68  109/66 109/66  Pulse: 95 68 97 98  Resp: 16 18 16 16   Temp: 98.2 F (36.8 C)     TempSrc:      SpO2: 98% 97%  92%  Weight:    69.6 kg  Height:        General: Pt is alert, awake, not in acute distress Cardiovascular: RRR, S1/S2 +, no rubs, no gallops Respiratory: CTA bilaterally, no wheezing, no rhonchi, on 3L [place] Abdominal: Soft, NT, ND, bowel sounds + Extremities: no edema, no cyanosis    The results of significant diagnostics from this hospitalization (including imaging, microbiology, ancillary and laboratory) are listed below for reference.     Microbiology: Recent Results (from the past 240 hour(s))  SARS Coronavirus 2 (CEPHEID - Performed in [facility] hospital lab), Hosp Order     Status: None   Collection Time: 02/07/19  7:47 PM  Result Value Ref Range Status   SARS Coronavirus 2 NEGATIVE NEGATIVE Final    Comment: (NOTE) If result is NEGATIVE SARS-CoV-2 target nucleic acids are NOT DETECTED. The SARS-CoV-2 RNA is generally detectable in upper and lower  respiratory specimens during the acute phase of infection. The lowest  concentration of SARS-CoV-2 viral copies this assay can detect is 250  copies / mL. A negative result does not preclude SARS-CoV-2 infection  and  should not be used as the sole basis for treatment or other  patient management decisions.  A negative result may occur with  improper specimen collection / handling, submission of specimen other  than nasopharyngeal swab, presence of viral mutation(s) within the  areas targeted by this assay, and inadequate number of viral copies  (<250 copies / mL). A negative result must be combined with clinical  observations, patient history, and epidemiological information. If result is POSITIVE SARS-CoV-2 target nucleic acids are DETECTED. The SARS-CoV-2 RNA is generally  detectable in upper and lower  respiratory specimens dur ing the acute phase of infection.  Positive  results are indicative of active infection with SARS-CoV-2.  Clinical  correlation with patient history and other diagnostic information is  necessary to determine patient infection status.  Positive results do  not rule out bacterial infection or co-infection with other viruses. If result is PRESUMPTIVE POSTIVE SARS-CoV-2 nucleic acids MAY BE PRESENT.   A presumptive positive result was obtained on the submitted specimen  and confirmed on repeat testing.  While 2019 novel coronavirus  (SARS-CoV-2) nucleic acids may be present in the submitted sample  additional confirmatory testing may be necessary for epidemiological  and / or clinical management purposes  to differentiate between  SARS-CoV-2 and other Sarbecovirus currently known to infect humans.  If clinically indicated additional testing with an alternate test  methodology [phone]) is advised. The SARS-CoV-2 RNA is generally  detectable in upper and lower respiratory sp ecimens during the acute  phase of infection. The expected result is Negative. Fact Sheet for Patients:  BoilerBrush.com.cy Fact Sheet for Healthcare Providers: https://pope.com/ This test is not yet approved or cleared by the [place] FDA and has been authorized for detection and/or diagnosis of SARS-CoV-2 by FDA under an Emergency Use Authorization (EUA).  This EUA will remain in effect (meaning this test can be used) for the duration of the COVID-19 declaration under Section 564(b)(1) of the Act, 21 U.S.C. section 360bbb-3(b)(1), unless the authorization is terminated or revoked sooner. Performed at [facility], [address] [place], [place] [address]   Culture, blood (Routine X 2) w Reflex to ID Panel     Status: None (Preliminary result)   Collection Time: 02/08/19 12:13 AM  Result Value Ref Range  Status   Specimen Description BLOOD LEFT ANTECUBITAL  Final   Special Requests   Final    BOTTLES DRAWN AEROBIC AND ANAEROBIC Blood Culture adequate volume   Culture   Final    NO GROWTH 2 DAYS Performed at [facility], [address], [place], [place] [address]    Report Status PENDING  Incomplete  Culture, blood (Routine X 2) w Reflex to ID Panel     Status: None (Preliminary result)   Collection Time: 02/08/19 12:19 AM  Result Value Ref Range Status   Specimen Description BLOOD RIGHT HAND  Final   Special Requests   Final    BOTTLES DRAWN AEROBIC AND ANAEROBIC Blood Culture adequate volume   Culture   Final    NO GROWTH 2 DAYS Performed at [facility], [address] [place], [place] [address]    Report Status PENDING  Incomplete  MRSA PCR Screening     Status: None   Collection Time: 02/08/19  7:12 AM  Result Value Ref Range Status   MRSA by PCR NEGATIVE NEGATIVE Final    Comment:        The GeneXpert MRSA Assay (FDA approved for NASAL specimens only), is one  component of a comprehensive MRSA colonization surveillance program. It is not intended to diagnose MRSA infection nor to guide or monitor treatment for MRSA infections. Performed at [facility], [address] [place], [place] [address]      Labs: BNP (last 3 results) Recent Labs    01/27/19 0412 01/28/19 0640 01/29/19 0513  BNP 121.0* 83.0 130.0*   Basic Metabolic Panel: Recent Labs  Lab 02/07/19 1948 02/08/19 0527 02/09/19 0627 02/10/19 0757  NA 132* 134* 132* 137  K 4.6 4.6 3.9 4.6  CL 95* 96* 97* 98  CO2 GLUCOSE 257* 243* 175* 90  BUN 18 22 26* 26*  CREATININE 0.78 0.82 0.78 0.77  CALCIUM 9.2 9.3 9.0 9.6   Liver Function Tests: Recent Labs  Lab 02/07/19 1948 02/08/19 0527 02/09/19 0627 02/10/19 0757  AST 19 17 14* 16  ALT 78* 66* 47* 43  ALKPHOS 161* 150* 129* 133*  BILITOT 0.5 0.8 0.4 0.6  PROT 6.4* 6.5 5.9* 6.6  ALBUMIN 1.8* 1.8* 1.7* 1.9*   No results for  input(s): LIPASE, AMYLASE in the last 168 hours. No results for input(s): AMMONIA in the last 168 hours. CBC: Recent Labs  Lab 02/07/19 1948 02/08/19 0527 02/09/19 0627 02/10/19 0757  WBC 23.4* 22.9* 21.3* 22.0*  HGB 10.4* 10.0* 8.8* 9.9*  HCT 33.1* 33.2* 29.6* 33.3*  MCV 86.0 87.6 87.8 89.0  PLT 190 219 227 255   Cardiac Enzymes: Recent Labs  Lab 02/08/19 0013 02/08/19 0527 02/08/19 1231  TROPONINI 0.11* 0.10* 0.09*   BNP: Invalid input(s): POCBNP CBG: Recent Labs  Lab 02/09/19 0736 02/09/19 1147 02/09/19 1709 02/09/19 2134 02/10/19 0727  GLUCAP 138* 228* 131* 101* 69*   D-Dimer No results for input(s): DDIMER in the last 72 hours. Hgb A1c No results for input(s): HGBA1C in the last 72 hours. Lipid Profile Recent Labs    02/08/19 0527  CHOL 140  HDL 39*  LDLCALC 82  TRIG 96  CHOLHDL 3.6   Thyroid function studies Recent Labs    02/08/19 0013  TSH 0.410   Anemia work up No results for input(s): VITAMINB12, FOLATE, FERRITIN, TIBC, IRON, RETICCTPCT in the last 72 hours. Urinalysis    Component Value Date/Time   COLORURINE YELLOW 01/25/2019 0743   APPEARANCEUR HAZY (A) 01/25/2019 0743   LABSPEC 1.027 01/25/2019 0743   PHURINE 7.0 01/25/2019 0743   GLUCOSEU 150 (A) 01/25/2019 0743   HGBUR NEGATIVE 01/25/2019 0743   BILIRUBINUR NEGATIVE 01/25/2019 0743   KETONESUR NEGATIVE 01/25/2019 0743   PROTEINUR 30 (A) 01/25/2019 0743   UROBILINOGEN 0.2 02/08/2008 1731   NITRITE NEGATIVE 01/25/2019 0743   LEUKOCYTESUR NEGATIVE 01/25/2019 0743   Sepsis Labs Invalid input(s): PROCALCITONIN,  WBC,  LACTICIDVEN Microbiology Recent Results (from the past 240 hour(s))  SARS Coronavirus 2 (CEPHEID - Performed in [facility] hospital lab), Hosp Order     Status: None   Collection Time: 02/07/19  7:47 PM  Result Value Ref Range Status   SARS Coronavirus 2 NEGATIVE NEGATIVE Final    Comment: (NOTE) If result is NEGATIVE SARS-CoV-2 target nucleic acids are NOT  DETECTED. The SARS-CoV-2 RNA is generally detectable in upper and lower  respiratory specimens during the acute phase of infection. The lowest  concentration of SARS-CoV-2 viral copies this assay can detect is 250  copies / mL. A negative result does not preclude SARS-CoV-2 infection  and should not be used as the sole basis for treatment or other  patient management  decisions.  A negative result may occur with  improper specimen collection / handling, submission of specimen other  than nasopharyngeal swab, presence of viral mutation(s) within the  areas targeted by this assay, and inadequate number of viral copies  (<250 copies / mL). A negative result must be combined with clinical  observations, patient history, and epidemiological information. If result is POSITIVE SARS-CoV-2 target nucleic acids are DETECTED. The SARS-CoV-2 RNA is generally detectable in upper and lower  respiratory specimens dur ing the acute phase of infection.  Positive  results are indicative of active infection with SARS-CoV-2.  Clinical  correlation with patient history and other diagnostic information is  necessary to determine patient infection status.  Positive results do  not rule out bacterial infection or co-infection with other viruses. If result is PRESUMPTIVE POSTIVE SARS-CoV-2 nucleic acids MAY BE PRESENT.   A presumptive positive result was obtained on the submitted specimen  and confirmed on repeat testing.  While 2019 novel coronavirus  (SARS-CoV-2) nucleic acids may be present in the submitted sample  additional confirmatory testing may be necessary for epidemiological  and / or clinical management purposes  to differentiate between  SARS-CoV-2 and other Sarbecovirus currently known to infect humans.  If clinically indicated additional testing with an alternate test  methodology [phone]) is advised. The SARS-CoV-2 RNA is generally  detectable in upper and lower respiratory sp ecimens during  the acute  phase of infection. The expected result is Negative. Fact Sheet for Patients:  BoilerBrush.com.cy Fact Sheet for Healthcare Providers: https://pope.com/ This test is not yet approved or cleared by the [place] FDA and has been authorized for detection and/or diagnosis of SARS-CoV-2 by FDA under an Emergency Use Authorization (EUA).  This EUA will remain in effect (meaning this test can be used) for the duration of the COVID-19 declaration under Section 564(b)(1) of the Act, 21 U.S.C. section 360bbb-3(b)(1), unless the authorization is terminated or revoked sooner. Performed at [facility], [address], [place], [place] [address]   Culture, blood (Routine X 2) w Reflex to ID Panel     Status: None (Preliminary result)   Collection Time: 02/08/19 12:13 AM  Result Value Ref Range Status   Specimen Description BLOOD LEFT ANTECUBITAL  Final   Special Requests   Final    BOTTLES DRAWN AEROBIC AND ANAEROBIC Blood Culture adequate volume   Culture   Final    NO GROWTH 2 DAYS Performed at [facility], [address], [place], [place] [address]    Report Status PENDING  Incomplete  Culture, blood (Routine X 2) w Reflex to ID Panel     Status: None (Preliminary result)   Collection Time: 02/08/19 12:19 AM  Result Value Ref Range Status   Specimen Description BLOOD RIGHT HAND  Final   Special Requests   Final    BOTTLES DRAWN AEROBIC AND ANAEROBIC Blood Culture adequate volume   Culture   Final    NO GROWTH 2 DAYS Performed at [facility], [address] [place], [place] [address]    Report Status PENDING  Incomplete  MRSA PCR Screening     Status: None   Collection Time: 02/08/19  7:12 AM  Result Value Ref Range Status   MRSA by PCR NEGATIVE NEGATIVE Final    Comment:        The GeneXpert MRSA Assay (FDA approved for NASAL specimens only), is one component of a comprehensive MRSA colonization surveillance program.  It is not intended to diagnose MRSA  infection nor to guide or monitor treatment for MRSA infections. Performed at [facility], [address] [place], [place] [address]      Time coordinating discharge: 35 minutes  SIGNED:   Erick Blinks, DO [facility] 02/10/2019, 10:14 AM  If 7PM-7AM, please contact night-coverage [email or web address] Password [email or web address]

## 2019-02-12 LAB — LEGIONELLA PNEUMOPHILA SEROGP 1 UR AG: L. pneumophila Serogp 1 Ur Ag: NEGATIVE

## 2019-02-13 LAB — CULTURE, BLOOD (ROUTINE X 2)
Culture: NO GROWTH
Culture: NO GROWTH
Special Requests: ADEQUATE
Special Requests: ADEQUATE

## 2019-03-05 DEATH — deceased

## 2020-02-15 IMAGING — CR PORTABLE CHEST - 1 VIEW
2 series · 2 of 2 positions shown · non-contrast
Comparison: 02/05/2019

CLINICAL DATA: Shortness of breath

EXAM:
PORTABLE CHEST 1 VIEW

[portable (1 of 2)]
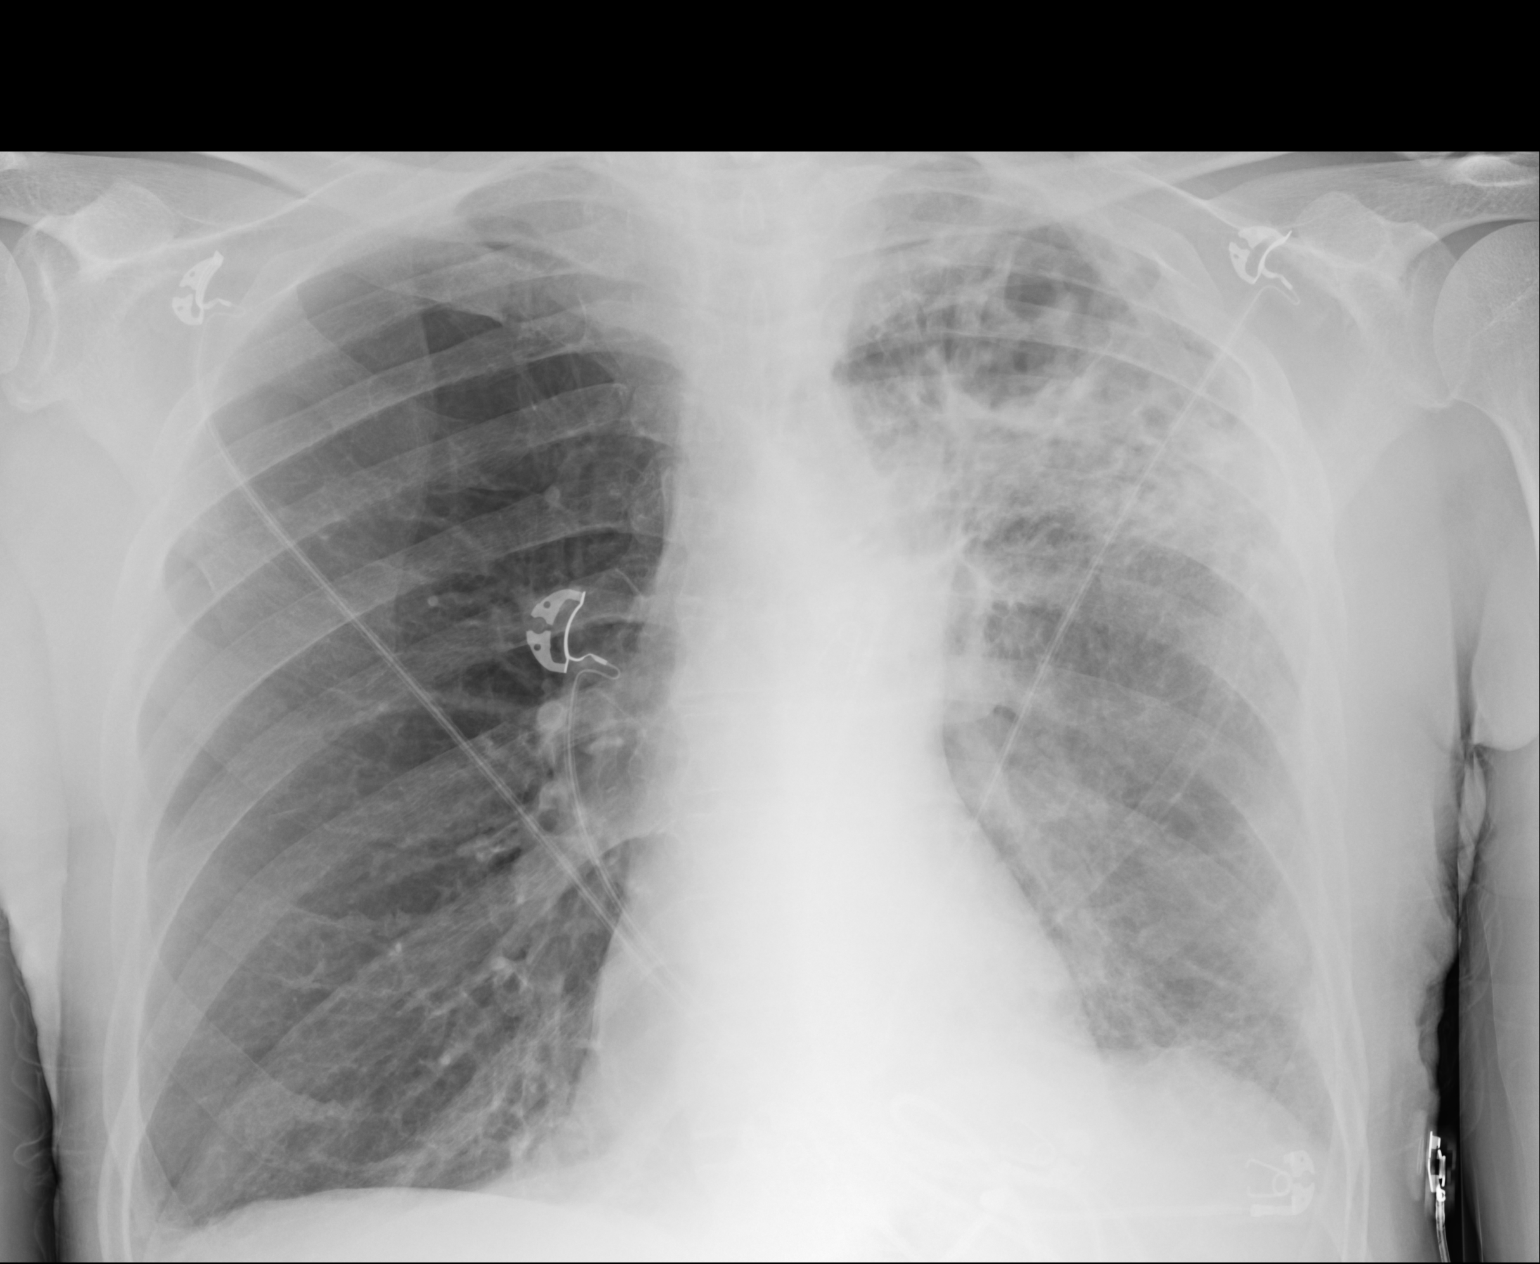

[portable (2 of 2)]
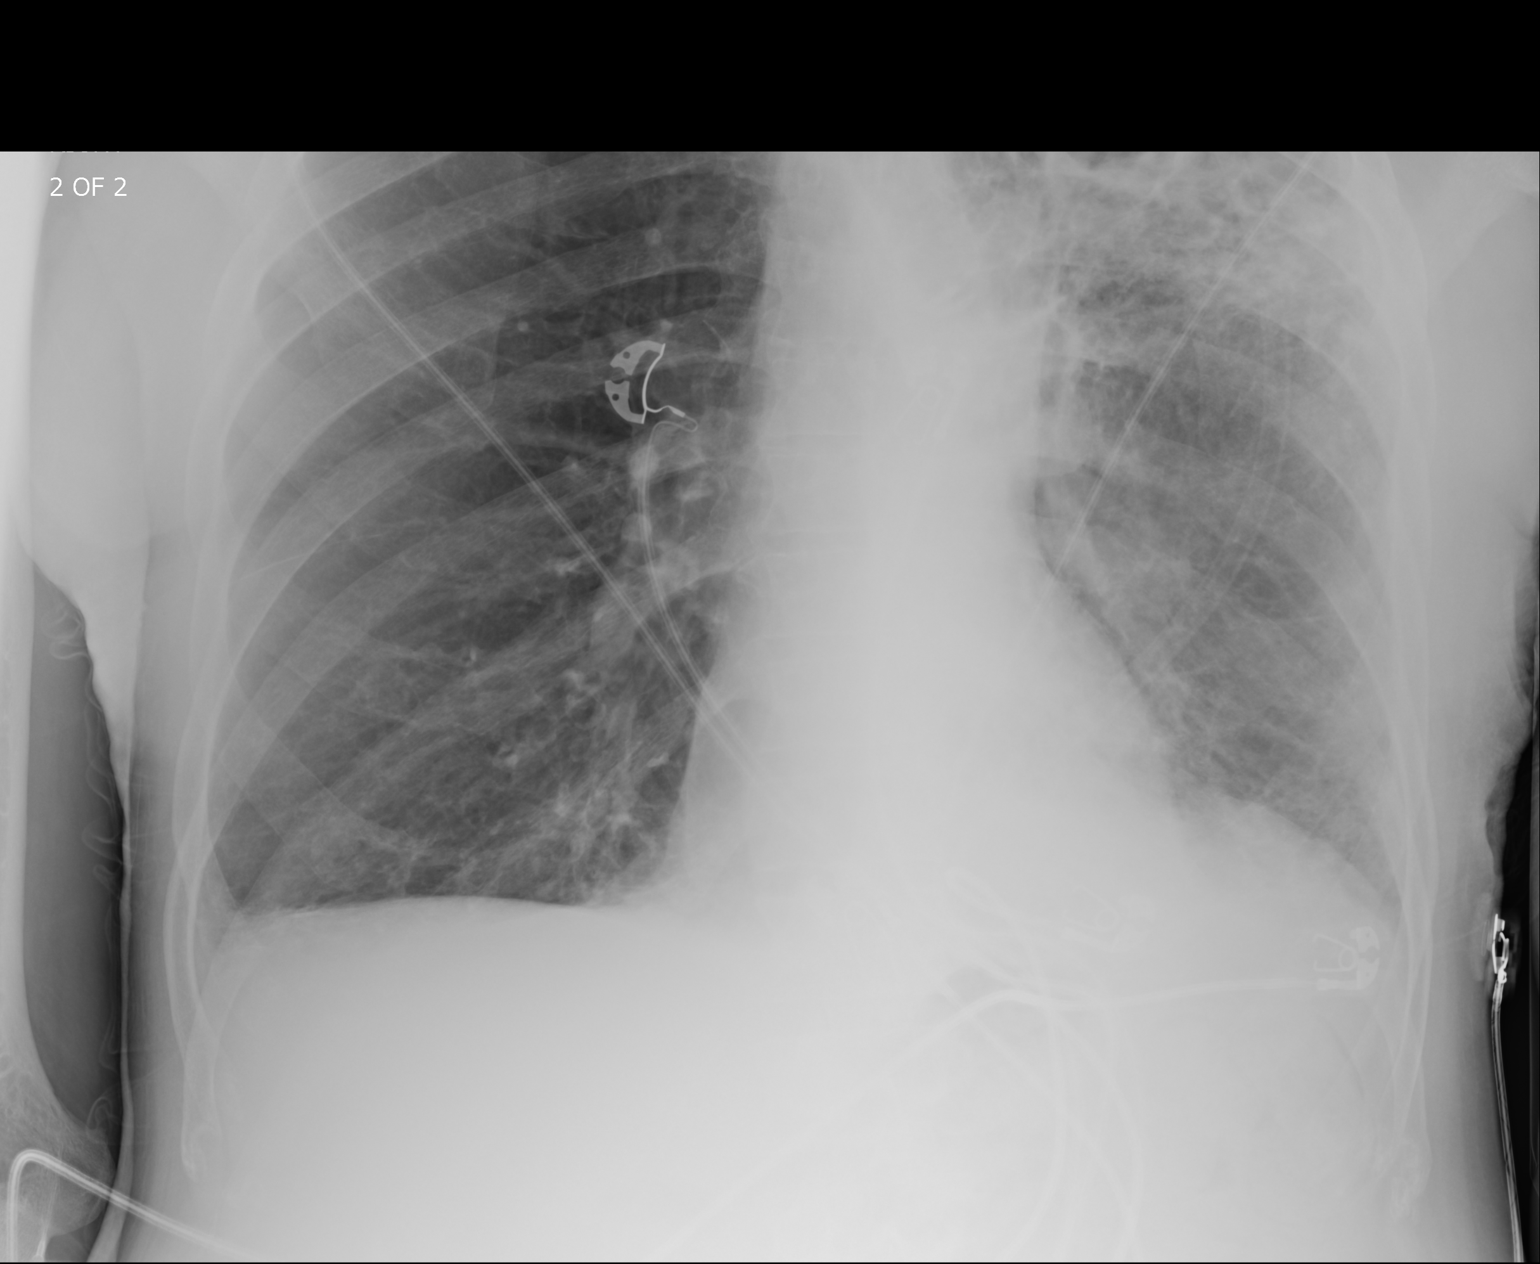

[2 of 2 positions shown; findings below may reference images not displayed]

FINDINGS: Cardiac shadow is within normal limits. The right lung is
hyperaerated consistent with COPD and stable from the previous exam.
Chronic consolidative densities are noted in the left upper lobe
stable from the prior exam. No new focal abnormality is noted.
IMPRESSION: Stable changes in the left upper lobe when compare with the prior CT
examination from 2 days previous.

## 2020-02-16 IMAGING — CT CT ABDOMEN AND PELVIS WITH CONTRAST
2 of 5 series · 16 of 46 positions shown, 18 images · IV contrast (Isovue)
Comparison: CT chest, 02/05/2019

CLINICAL DATA: Weight loss, abnormal liver function test,
left-sided chest pain history of pneumonia

EXAM:
CT ABDOMEN AND PELVIS WITH CONTRAST
TECHNIQUE: Multidetector CT imaging of the abdomen and pelvis was performed
using the standard protocol following bolus administration of
intravenous contrast.
CONTRAST:  100mL OMNIPAQUE IOHEXOL 300 MG/ML SOLN, additional oral
enteric contrast

[Series 2: axial st · axial · 0.66mm/px · z∈[+768,+1168]mm · 13 of 94 slices shown, 15 images]
[im 7/94  soft-tissue]
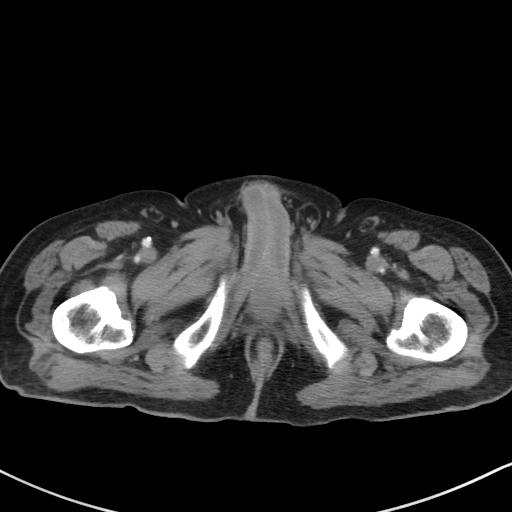
[im 7/94  bone]
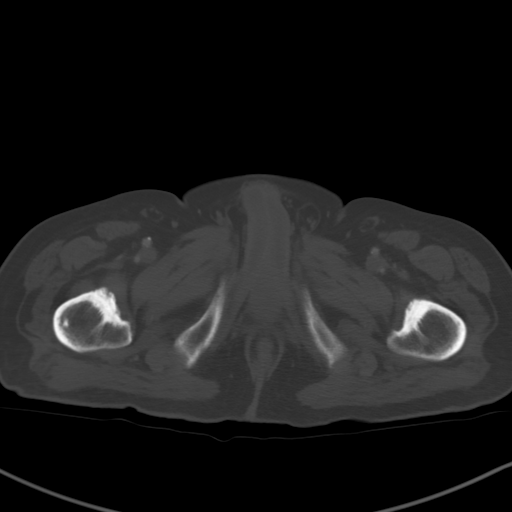
[im 13/94  soft-tissue]
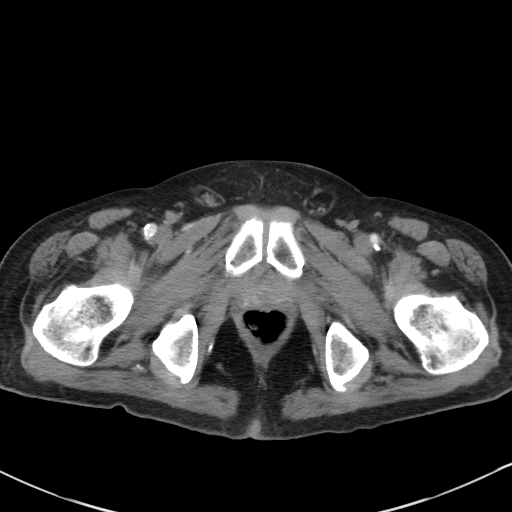
[im 19/94  soft-tissue]
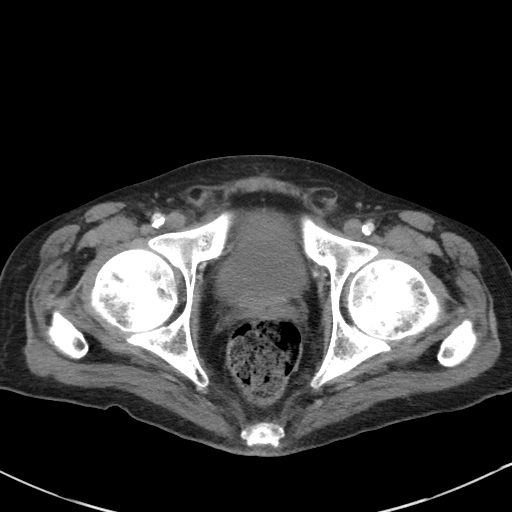
[im 25/94  soft-tissue]
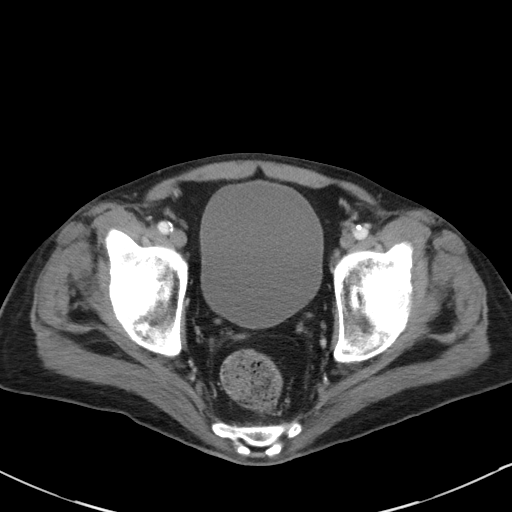
[im 32/94  soft-tissue]
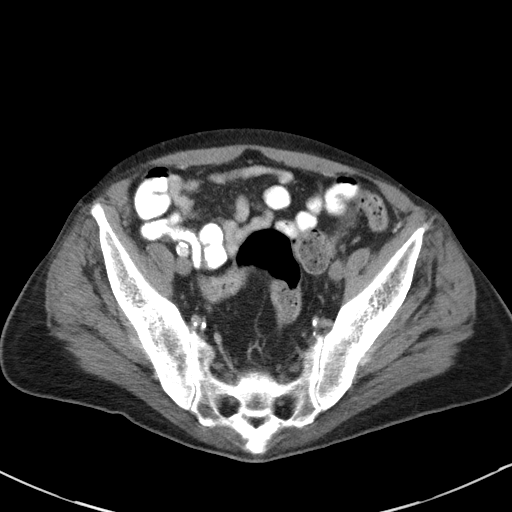
[im 38/94  soft-tissue]
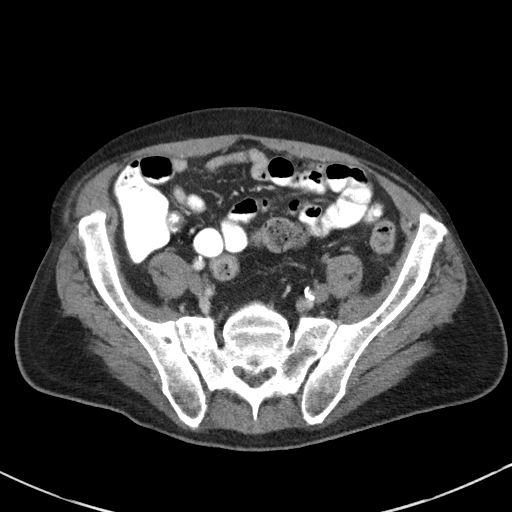
[im 50/94  soft-tissue]
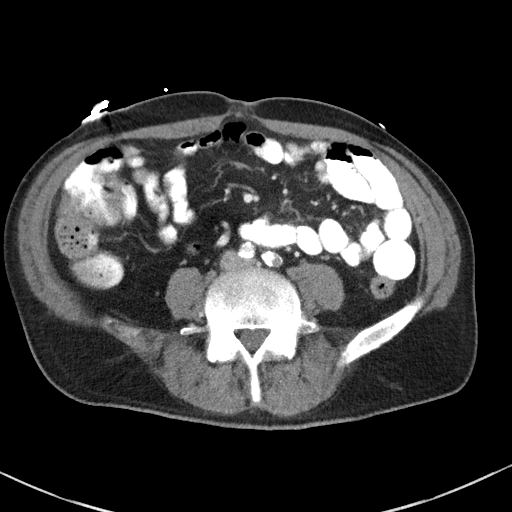
[im 56/94  soft-tissue]
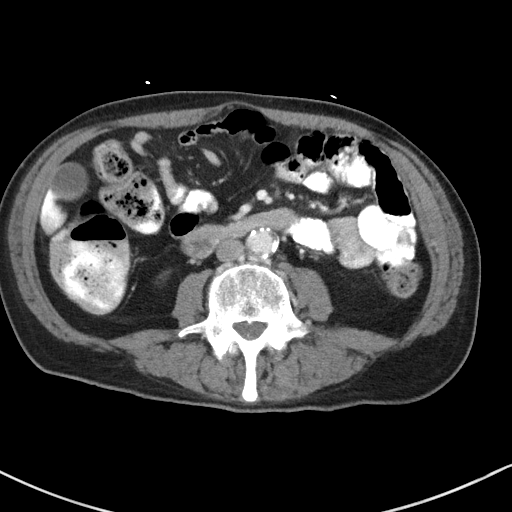
[im 63/94  soft-tissue]
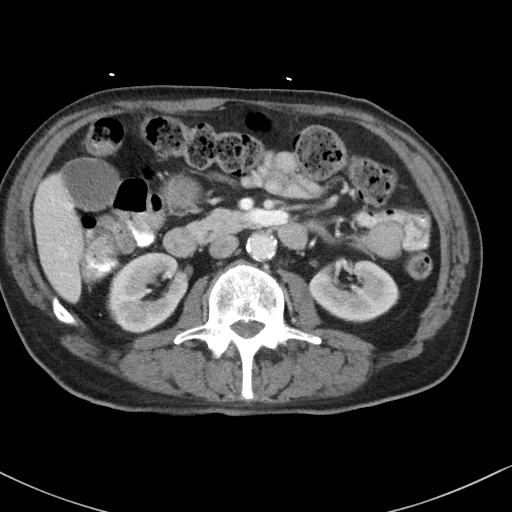
[im 63/94  bone]
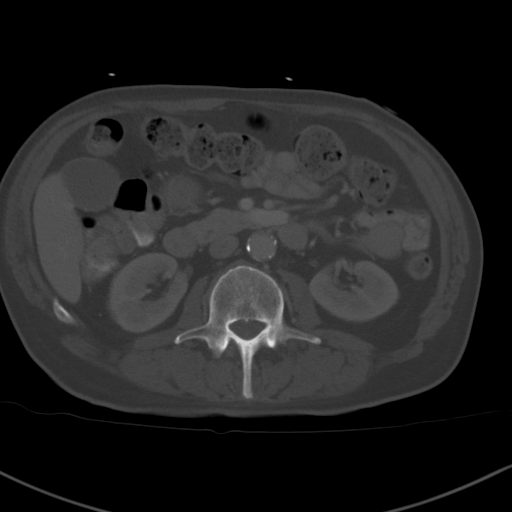
[im 69/94  soft-tissue]
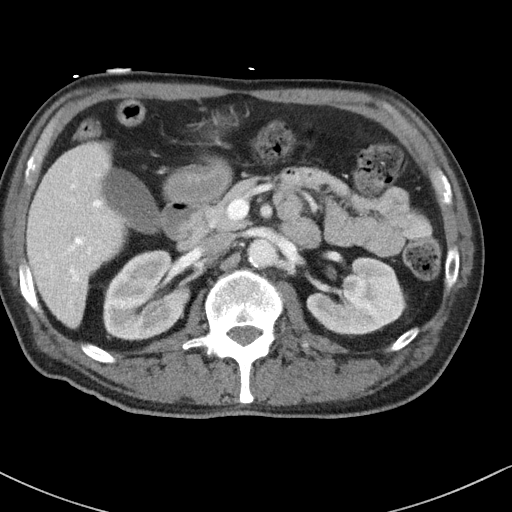
[im 75/94  soft-tissue]
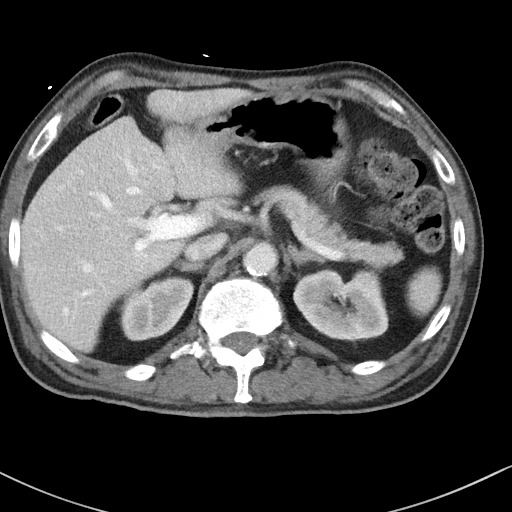
[im 81/94  soft-tissue]
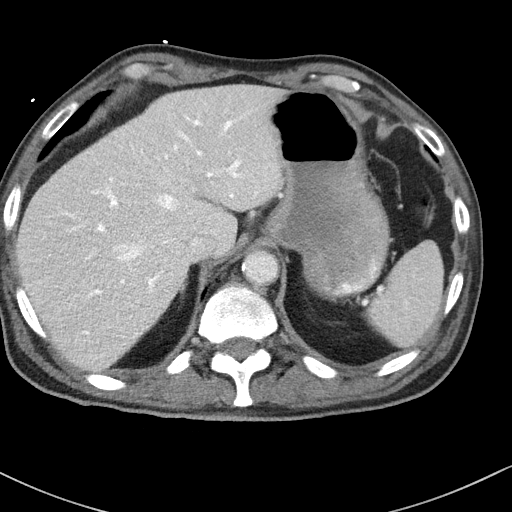
[im 87/94  soft-tissue]
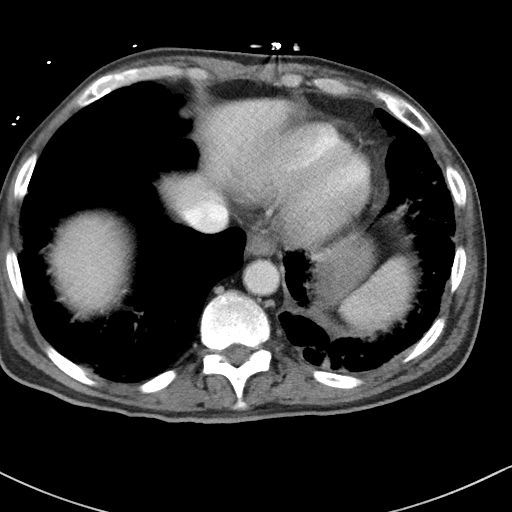

[Series 6: coronal st · coronal · 0.82mm/px · 3 of 101 slices shown]
[im 34/101  soft-tissue]
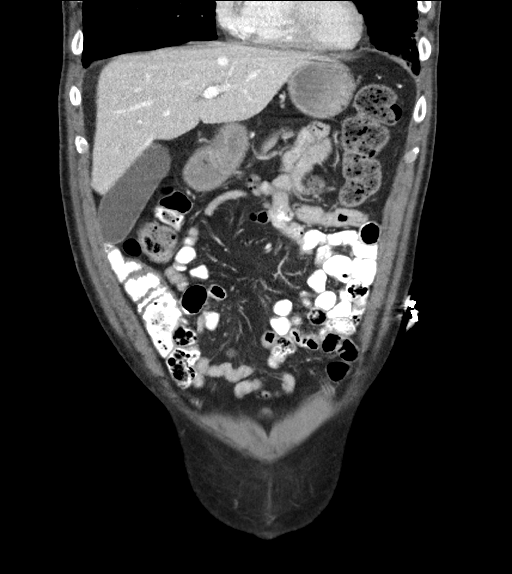
[im 45/101  soft-tissue]
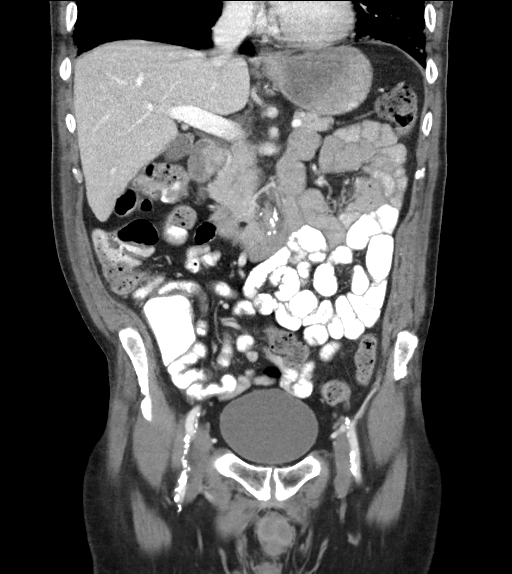
[im 56/101  soft-tissue]
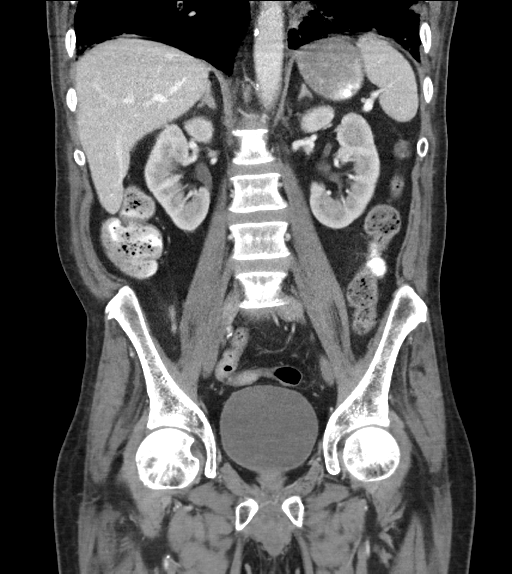

[16 of 46 positions shown; findings below may reference images not displayed]

FINDINGS: Lower chest: Heterogeneous airspace opacity of the dependent left
lower lobe, bibasilar fibrotic change, and emphysema as seen on
prior CT of the chest.

Hepatobiliary: No focal liver abnormality is seen. No gallstones,
gallbladder wall thickening, or biliary dilatation.

Pancreas: Unremarkable. No pancreatic ductal dilatation or
surrounding inflammatory changes.

Spleen: Normal in size without focal abnormality.

Adrenals/Urinary Tract: Adrenal glands are unremarkable. Kidneys are
normal, without renal calculi, focal lesion, or hydronephrosis.
Bladder is unremarkable.

Stomach/Bowel: Stomach is within normal limits. Appendix appears
normal. No evidence of bowel wall thickening, distention, or
inflammatory changes. Large burden of stool in the colon.

Vascular/Lymphatic: Severe mixed calcific atherosclerosis of the
abdominal aorta and branch vessels. There appears to be long segment
occlusion of the left common iliac artery from the origin with
epigastric reconstitution of the left common femoral artery. No
enlarged abdominal or pelvic lymph nodes.

Reproductive: No mass or other abnormality.

Other: No abdominal wall hernia or abnormality. No abdominopelvic
ascites.

Musculoskeletal: No acute or significant osseous findings.
IMPRESSION: 1. No CT abnormality of the abdomen or pelvis to explain abnormal
LFTs or weight loss.

2. Severe mixed calcific atherosclerosis of the abdominal aorta and
branch vessels. There appears to be long segment occlusion of the
left common iliac artery from the origin with epigastric
reconstitution of the left common femoral artery.

3. Heterogeneous airspace opacity of the dependent left lower lobe,
bibasilar fibrotic change, and emphysema as seen on prior CT of the
chest.
